# Patient Record
Sex: Female | Born: 1935 | Race: White | Hispanic: No | Marital: Married | Smoking: Former smoker
Health system: Southern US, Community
[De-identification: ages and names within clinical notes are randomized; demographics above are authoritative.]

## PROBLEM LIST (undated history)

## (undated) DIAGNOSIS — R42 Dizziness and giddiness: Secondary | ICD-10-CM

## (undated) DIAGNOSIS — M069 Rheumatoid arthritis, unspecified: Secondary | ICD-10-CM

## (undated) DIAGNOSIS — W19XXXA Unspecified fall, initial encounter: Secondary | ICD-10-CM

## (undated) DIAGNOSIS — D320 Benign neoplasm of cerebral meninges: Secondary | ICD-10-CM

## (undated) DIAGNOSIS — M810 Age-related osteoporosis without current pathological fracture: Secondary | ICD-10-CM

## (undated) DIAGNOSIS — F419 Anxiety disorder, unspecified: Secondary | ICD-10-CM

## (undated) DIAGNOSIS — E785 Hyperlipidemia, unspecified: Secondary | ICD-10-CM

## (undated) DIAGNOSIS — T4145XA Adverse effect of unspecified anesthetic, initial encounter: Secondary | ICD-10-CM

## (undated) DIAGNOSIS — M1990 Unspecified osteoarthritis, unspecified site: Secondary | ICD-10-CM

## (undated) DIAGNOSIS — Z9289 Personal history of other medical treatment: Secondary | ICD-10-CM

## (undated) DIAGNOSIS — Z79899 Other long term (current) drug therapy: Secondary | ICD-10-CM

## (undated) DIAGNOSIS — R197 Diarrhea, unspecified: Secondary | ICD-10-CM

## (undated) DIAGNOSIS — R296 Repeated falls: Secondary | ICD-10-CM

## (undated) DIAGNOSIS — F32A Depression, unspecified: Secondary | ICD-10-CM

## (undated) DIAGNOSIS — I739 Peripheral vascular disease, unspecified: Secondary | ICD-10-CM

## (undated) DIAGNOSIS — K5190 Ulcerative colitis, unspecified, without complications: Secondary | ICD-10-CM

## (undated) DIAGNOSIS — K219 Gastro-esophageal reflux disease without esophagitis: Secondary | ICD-10-CM

## (undated) DIAGNOSIS — J189 Pneumonia, unspecified organism: Secondary | ICD-10-CM

## (undated) DIAGNOSIS — F329 Major depressive disorder, single episode, unspecified: Secondary | ICD-10-CM

## (undated) DIAGNOSIS — E039 Hypothyroidism, unspecified: Secondary | ICD-10-CM

## (undated) DIAGNOSIS — R0600 Dyspnea, unspecified: Secondary | ICD-10-CM

## (undated) DIAGNOSIS — Z8781 Personal history of (healed) traumatic fracture: Secondary | ICD-10-CM

## (undated) DIAGNOSIS — T148XXA Other injury of unspecified body region, initial encounter: Secondary | ICD-10-CM

## (undated) DIAGNOSIS — G8929 Other chronic pain: Secondary | ICD-10-CM

## (undated) DIAGNOSIS — R609 Edema, unspecified: Secondary | ICD-10-CM

## (undated) DIAGNOSIS — I499 Cardiac arrhythmia, unspecified: Secondary | ICD-10-CM

## (undated) DIAGNOSIS — I10 Essential (primary) hypertension: Secondary | ICD-10-CM

## (undated) DIAGNOSIS — H9190 Unspecified hearing loss, unspecified ear: Secondary | ICD-10-CM

## (undated) DIAGNOSIS — M4800 Spinal stenosis, site unspecified: Secondary | ICD-10-CM

## (undated) DIAGNOSIS — M7989 Other specified soft tissue disorders: Secondary | ICD-10-CM

## (undated) DIAGNOSIS — H409 Unspecified glaucoma: Secondary | ICD-10-CM

## (undated) DIAGNOSIS — I809 Phlebitis and thrombophlebitis of unspecified site: Secondary | ICD-10-CM

## (undated) DIAGNOSIS — J42 Unspecified chronic bronchitis: Secondary | ICD-10-CM

## (undated) DIAGNOSIS — D649 Anemia, unspecified: Secondary | ICD-10-CM

## (undated) DIAGNOSIS — M549 Dorsalgia, unspecified: Secondary | ICD-10-CM

## (undated) HISTORY — DX: Other specified soft tissue disorders: M79.89

## (undated) HISTORY — PX: EYE SURGERY: SHX253

## (undated) HISTORY — PX: SPINE SURGERY: SHX786

## (undated) HISTORY — DX: Diarrhea, unspecified: R19.7

## (undated) HISTORY — DX: Other long term (current) drug therapy: Z79.899

## (undated) HISTORY — PX: SINUS SURGERY WITH INSTATRAK: SHX5215

## (undated) HISTORY — DX: Hyperlipidemia, unspecified: E78.5

## (undated) HISTORY — DX: Phlebitis and thrombophlebitis of unspecified site: I80.9

## (undated) HISTORY — DX: Edema, unspecified: R60.9

## (undated) HISTORY — DX: Unspecified glaucoma: H40.9

## (undated) HISTORY — PX: JOINT REPLACEMENT: SHX530

## (undated) HISTORY — PX: TONSILLECTOMY: SUR1361

## (undated) HISTORY — DX: Spinal stenosis, site unspecified: M48.00

## (undated) HISTORY — PX: BACK SURGERY: SHX140

## (undated) HISTORY — DX: Age-related osteoporosis without current pathological fracture: M81.0

---

## 1986-03-13 HISTORY — PX: FUNCTIONAL ENDOSCOPIC SINUS SURGERY: SUR616

## 1995-03-14 HISTORY — PX: THYROIDECTOMY, PARTIAL: SHX18

## 1998-01-26 ENCOUNTER — Other Ambulatory Visit: Admission: RE | Admit: 1998-01-26 | Discharge: 1998-01-26 | Payer: Self-pay | Admitting: Obstetrics & Gynecology

## 1999-02-17 ENCOUNTER — Other Ambulatory Visit: Admission: RE | Admit: 1999-02-17 | Discharge: 1999-02-17 | Payer: Self-pay | Admitting: Obstetrics & Gynecology

## 2000-04-16 ENCOUNTER — Other Ambulatory Visit: Admission: RE | Admit: 2000-04-16 | Discharge: 2000-04-16 | Payer: Self-pay | Admitting: Obstetrics & Gynecology

## 2000-04-30 ENCOUNTER — Encounter: Payer: Self-pay | Admitting: Family Medicine

## 2000-04-30 ENCOUNTER — Encounter: Admission: RE | Admit: 2000-04-30 | Discharge: 2000-04-30 | Payer: Self-pay | Admitting: Family Medicine

## 2001-05-09 ENCOUNTER — Other Ambulatory Visit: Admission: RE | Admit: 2001-05-09 | Discharge: 2001-05-09 | Payer: Self-pay | Admitting: Obstetrics & Gynecology

## 2002-03-13 LAB — HM COLONOSCOPY

## 2002-06-10 ENCOUNTER — Other Ambulatory Visit: Admission: RE | Admit: 2002-06-10 | Discharge: 2002-06-10 | Payer: Self-pay | Admitting: Obstetrics & Gynecology

## 2004-06-23 ENCOUNTER — Other Ambulatory Visit: Admission: RE | Admit: 2004-06-23 | Discharge: 2004-06-23 | Payer: Self-pay | Admitting: Obstetrics & Gynecology

## 2004-08-18 ENCOUNTER — Encounter (INDEPENDENT_AMBULATORY_CARE_PROVIDER_SITE_OTHER): Payer: Self-pay | Admitting: Specialist

## 2004-08-18 ENCOUNTER — Ambulatory Visit (HOSPITAL_BASED_OUTPATIENT_CLINIC_OR_DEPARTMENT_OTHER): Admission: RE | Admit: 2004-08-18 | Discharge: 2004-08-18 | Payer: Self-pay | Admitting: Orthopedic Surgery

## 2004-08-18 ENCOUNTER — Ambulatory Visit (HOSPITAL_COMMUNITY): Admission: RE | Admit: 2004-08-18 | Discharge: 2004-08-18 | Payer: Self-pay | Admitting: Orthopedic Surgery

## 2006-03-13 DIAGNOSIS — J189 Pneumonia, unspecified organism: Secondary | ICD-10-CM

## 2006-03-13 HISTORY — DX: Pneumonia, unspecified organism: J18.9

## 2006-10-18 ENCOUNTER — Ambulatory Visit: Payer: Self-pay | Admitting: Internal Medicine

## 2006-11-29 ENCOUNTER — Ambulatory Visit: Payer: Self-pay | Admitting: Internal Medicine

## 2007-03-14 DIAGNOSIS — T8859XA Other complications of anesthesia, initial encounter: Secondary | ICD-10-CM

## 2007-03-14 HISTORY — DX: Other complications of anesthesia, initial encounter: T88.59XA

## 2007-04-19 ENCOUNTER — Encounter: Admission: RE | Admit: 2007-04-19 | Discharge: 2007-04-19 | Payer: Self-pay | Admitting: Internal Medicine

## 2007-07-02 ENCOUNTER — Encounter (INDEPENDENT_AMBULATORY_CARE_PROVIDER_SITE_OTHER): Payer: Self-pay | Admitting: Orthopedic Surgery

## 2007-07-02 ENCOUNTER — Ambulatory Visit (HOSPITAL_BASED_OUTPATIENT_CLINIC_OR_DEPARTMENT_OTHER): Admission: RE | Admit: 2007-07-02 | Discharge: 2007-07-02 | Payer: Self-pay | Admitting: Orthopedic Surgery

## 2008-03-02 ENCOUNTER — Encounter: Admission: RE | Admit: 2008-03-02 | Discharge: 2008-03-02 | Payer: Self-pay | Admitting: Internal Medicine

## 2009-09-10 ENCOUNTER — Encounter: Admission: RE | Admit: 2009-09-10 | Discharge: 2009-09-10 | Payer: Self-pay | Admitting: Neurological Surgery

## 2009-11-11 ENCOUNTER — Inpatient Hospital Stay (HOSPITAL_COMMUNITY): Admission: RE | Admit: 2009-11-11 | Discharge: 2009-11-15 | Payer: Self-pay | Admitting: Neurological Surgery

## 2009-11-11 HISTORY — PX: POSTERIOR FUSION LUMBAR SPINE: SUR632

## 2010-01-11 ENCOUNTER — Emergency Department (HOSPITAL_COMMUNITY)
Admission: EM | Admit: 2010-01-11 | Discharge: 2010-01-12 | Payer: Self-pay | Source: Home / Self Care | Admitting: Emergency Medicine

## 2010-05-27 LAB — BASIC METABOLIC PANEL
Chloride: 103 mEq/L (ref 96–112)
GFR calc Af Amer: >60 mL/min (ref >60)
Potassium: 4.1 mEq/L (ref 3.5–5.1)

## 2010-05-27 LAB — CBC
HCT: 43.2 % (ref 36.0–46.0)
MCV: 99.5 fL (ref 78.0–100.0)
Platelets: 295 10*3/uL (ref 150–400)
RBC: 4.34 MIL/uL (ref 3.87–5.11)
WBC: 7.9 10*3/uL (ref 4.0–10.5)

## 2010-05-27 LAB — TYPE AND SCREEN

## 2010-05-27 LAB — SURGICAL PCR SCREEN: Staphylococcus aureus: NEGATIVE

## 2010-06-07 ENCOUNTER — Other Ambulatory Visit: Payer: Self-pay | Admitting: Neurological Surgery

## 2010-06-07 DIAGNOSIS — M47812 Spondylosis without myelopathy or radiculopathy, cervical region: Secondary | ICD-10-CM

## 2010-06-17 ENCOUNTER — Ambulatory Visit
Admission: RE | Admit: 2010-06-17 | Discharge: 2010-06-17 | Disposition: A | Discharge status: Home / Self Care | Payer: Medicare Other | Source: Ambulatory Visit | Attending: Neurological Surgery | Admitting: Neurological Surgery

## 2010-06-17 DIAGNOSIS — M47812 Spondylosis without myelopathy or radiculopathy, cervical region: Secondary | ICD-10-CM

## 2010-07-26 NOTE — Assessment & Plan Note (Signed)
[facility]                             PULMONARY OFFICE NOTE   NAME:[name] [name] PRASHAD                      MRN:          [id number]  DATE:10/18/2006                            DOB:          1934/12/04    HISTORY:  This is a 76 year old female with long standing rheumatoid  arthritis, and also a long standing smoking history still smoking 5  cigarettes a day and concerned because she has an abnormal chest x-ray.  The patient denies ever having had a chest x-ray before returning from  [place], where she experienced a dry cough that was associated with  dyspnea on exertion, but became worse after travel back to [place], and  within two days she was having increasing dyspnea at rest associated  with purulent sputum production for which she received a course of  Levaquin, and initially, she felt 90% better which gradually has  improved now to 100% baseline.   It turns out that her baseline is an almost daily sensation of too  much throat mucus with intermittent production of thick white mucus  that is worse several times a year and requires antibiotic therapy. She  says that she has noticed it is worse whenever she flies, and also in  the springtime. She denies ever needing chronic inhalers or significant  itching, sneezing, wheezing, obvious sinus, or reflux symptoms.   PAST MEDICAL HISTORY:  Significant for rheumatoid arthritis for which  she is on Methotrexate and prednisone, but she has never had pulmonary  manifestations to her knowledge and feels that her disease is well  controlled under Dr. Jeanine Luz care. Her past medical history is also  significant for ulcerative colitis and thyroid disease.   ALLERGIES:  None known.   MEDICATIONS:  Taken in detail on the worksheet, do not include any  pulmonary medicines. Please see the face sheet column dated 10/18/2006 for  details.   SOCIAL HISTORY:  She continues to smoke 5 cigarettes per day and she  is  a retired Comptroller.   FAMILY HISTORY:  Significant for the absence of rheumatologic disease or  respiratory disease.   REVIEW OF SYSTEMS:  Taken in detail on the worksheet and negative except  as outlined above.   PHYSICAL EXAMINATION:  GENERAL:  This is a pleasant, ambulatory white  female in no acute distress.  VITAL SIGNS:  Stable.  HEENT:  Unremarkable. Oropharynx is clear. Dentition is intact. Nasal  turbinates are normal. Ear canals clear bilaterally.  NECK:  Supple without cervical adenopathy, tenderness, or thyromegaly.  Trachea is midline.  LUNGS:  Fields reveal a few pops and squeaks bilaterally on inspiration  with minimal rhonchi on expiration.  CARDIAC:  Regular rate and rhythm with no murmurs, rubs, or gallops.  ABDOMEN:  Soft and benign.  EXTREMITIES:  Warm without calf tenderness, cyanosis, clubbing, or  edema.   No old x-rays are available. The new x-rays that have been obtained  since she returned from [place] indicate an infiltrate in the right middle  lobe that is not completely resolved with most recent x-ray dating  09/19/2006.   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 It is not clear to me whether the frequent  bronchitis that she has had represents immunocompromise from stimulus  suppression or perhaps the effect of rheumatoid arthritis on her airways  or parenchyma. 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   I would recommend therefore that she return in six weeks for a follow up  chest x-ray, PFTs, and that she make every effort to stop smoking  between now and then. If she would like referral to our [facility]  program or prescription for Chantix, I would be happy to supply one to  her,  but the decision to quit smoking needs to come from her first.   I spent extra time discussing the concept of a mucociliary escalator  in terms that I thought that she could understand, and I pleaded with  her to make every effort stop smoking and to take Mucinex 1 to 2 b.i.d.  p.r.n. for cough and congestion (we might consider adding Advair on her  next visit depending on the level of airflow obstruction that is present  to control the airways inflammation and promote better mucociliary  function).     [name] [name]. Sherene Sires, MD, [facility]  Electronically Signed    MBW/MedQ  DD: 10/18/2006  DT: 10/19/2006  Job #: [id number]   cc:   [name] Millard. Hyacinth [name], M.D.  [name] [name], M.D.

## 2010-07-26 NOTE — Assessment & Plan Note (Signed)
[facility]                             PULMONARY OFFICE NOTE   NAME:Kerekes, KARN DERK                      MRN:          [id number]  DATE:11/29/2006                            DOB:          09-12-1934    This is a pulmonary/followup office visit.   HISTORY:  This is a 76 year old white female with rheumatoid arthritis  returning for concern regarding right middle lobe syndrome based on her  previous evaluation done on October 18, 2006.  She is all smiles today  despite the fact that she is still smoking 2 cigarettes a day, has no  significant respiratory complaints.  Specifically, she denies any  significant dyspnea, cough, fevers, chills, sweats, pleuritic or  exertional chest pain, orthopnea, PND, leg swelling or unintended weight  loss.   She is a pleasant, ambulatory female.  For a full inventory of all of  her medications, please see Face Sheet dated November 29, 2006, correct  as listed.   PHYSICAL EXAMINATION:  She is a somber, but not overtly depressed,  ambulatory white female in no acute distress.  She is afebrile with  normal vital signs.  HEENT:  Unremarkable.  OROPHARYNX:  Clear.  LUNG FIELDS:  A few pops and squeaks but much improved from previous  evaluation.  Pops and squeaks on inspiration.  Much improved from  previous evaluation.  HEART:  Regular rate and rhythm without murmur, gallop or rub.  ABDOMEN:  Soft, benign.  EXTREMITIES:  Normal without any calf tenderness, cyanosis, clubbing,  edema.   PFTs were performed today and reveal that she does not have significant  COPD.  She has normal airflow, diffusion capacity corrects to 100%.  A  chest x-ray shows minimal streaky changes in the right middle lobe which  compared to previous studies from 2003 are identical.   IMPRESSION:  1. 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  Therefore, the main      aspect of her care is to maintain her off cigarettes, use as needed      Mucinex but no more aggressive bronchodilators are needed.  2. Right middle lobe syndrome in the setting of acquired mucociliary      dysfunction is secondary to poor collateral ventilation and      tendency to atelectasis.  It is rarely, however, a complication of      lung cancer but would not have improved with conservative      management.   I believe her baseline chest x-ray also shows, in retrospect, from 2003  an area of streaky atelectasis in the right middle lobe, but other than  maintaining off cigarettes, and p.r.n. Mucinex, I have nothing else to  recommend other than a chest x-ray at 6 months and then yearly  thereafter, especially if she continues to smoke, which I have strongly  advised her to commit to quit.   PULMONARY FOLLOWUP:  Is going to be p.r.n.     Charlaine Dalton. Sherene Sires, MD,  [facility]  Electronically Signed    MBW/MedQ  DD: 11/29/2006  DT: 11/29/2006  Job #: [id number]   cc:   Bertram Millard. Hyacinth Meeker, M.D.  Lemmie Evens, M.D.

## 2010-07-26 NOTE — Op Note (Signed)
[name], [name]               ACCOUNT NO.:  [id number]   MEDICAL RECORD NO.:  [id number]          PATIENT TYPE:  AMB   LOCATION:  DSC                          FACILITY:  [facility]   PHYSICIAN:  Katy Fitch. Sypher, M.D. DATE OF BIRTH:  Jul 03, 1934   DATE OF PROCEDURE:  07/02/2007  DATE OF DISCHARGE:                               OPERATIVE REPORT   REFERRING PHYSICIAN:  Dr. Jimmye Norman.   PREOPERATIVE DIAGNOSES:  Chronic rheumatoid arthritis with progressive  deformity of right index finger metacarpal phalangeal joint and severe  ulnar deviation deformity and remodeling of right long finger proximal  interphalangeal joints.   POSTOPERATIVE DIAGNOSES:  Chronic rheumatoid arthritis with progressive  deformity of right index finger metacarpal phalangeal joint and severe  ulnar deviation deformity and remodeling of right long finger proximal  interphalangeal joints.   OPERATIONS:  1. Implant arthroplasty and reconstruction of right index finger      metacarpal phalangeal joint with formal radial collateral ligament      reconstruction and extensor realignment.  2. Arthrodesis of right long finger PIP joint with correction of ulnar      deviation deformity utilizing 0.035-inch Kirschner wires x2, a      tension band wire, and a box wire to stabilize the PIP joint.   OPERATING SURGEON:  Katy Fitch. Sypher, M.D.   ASSISTANT:  Marveen Reeks. Dasnoit, PA-C.   ANESTHESIA:  General by LMA supplemented by a right infraclavicular  block, supervising anesthesiologist Dr. Sampson Goon.   INDICATIONS:  [name] is a 76 year old woman referred through the  courtesy of Dr. Syliva Overman for evaluation and management of  bilateral hand deformities due to chronic rheumatoid arthritis.  She is  status post successful left hand surgery and now returns for surgery to  reconstruct the right index finger metacarpophalangeal joint and the  right long finger PIP joint.  Preoperatively, she was advised  the  potential risks, and benefits of surgery.  She was referred by Dr.  Jimmye Norman her internal medicine physician as well as Dr. Jimmy Footman.  She has been off of her Arava and methotrexate for 1 week in the  perioperative period.   After informed consent, she is brought to operating room at this time.   PROCEDURE:  [name] is brought to the operating room and placed in  supine position on the operating table.   Following an anesthesia consult with Dr. Sampson Goon, a infraclavicular  block was placed on the right with excellent anesthesia of the right  forequarter and arm.   She was brought to room 6, placed in supine position on the table and  under Dr. Sampson Goon direct supervision general anesthesia by LMA  technique induced.   The entire upper extremity was prepped with Betadine soap solution,  sterilely draped.  A 1 gram of Ancef was administered as IV prophylactic  antibiotic.  The right arm was exsanguinated with Esmarch bandage and  the arterial tourniquet on the proximal brachium inflated to 250 mmHg.  The procedure commenced with a curvilinear incision exposing extensor  mechanism of the long finger PIP joint.  Subcutaneous tissues were  carefully divided taking care to identify and electrocauterized the  dorsal veins.  The sensory nerves were preserved.  The extensor tendon  was split the midline followed by exposure of the capsule of the PIP  joint.  The collateral ligaments were released in a joint open shotgun  style.  After complete synovectomy a cup and cone type arthrodesis was  fashioned.   There is marked deformity at the base of the middle phalanx.  Ultimately, we performed a cup and cone arthrodesis that would maximize  the surface contact between the proximal phalanx and middle phalanx.  A  figure-of-eight tension band wire and a box wire were both placed  through drill holes through the proximal phalangeal neck and the base of  the middle phalanx.   After these were tensioned, adjusting the position  of the finger to 30 degrees of flexion at the PIP joint two 0.035-inch  Kirschner wires were drilled across the joint, securing the arthrodesis.   Excellent bone-on-bone apposition was achieved over the dorsal and  central halves of the joint.   Due to the incongruity between the deformed middle phalanx and proximal  phalanx compromises were accepted to maximize bone-on-bone contact.  The  capsule was then repaired with interrupted suture of 4-0 Mersilene  followed by repair the extensor mechanism with the multiple interrupted  sutures figure-of-eight style of 4-0 Mersilene knots buried.  The skin  is repaired with intradermal 4-0 Prolene.   Attention directed to the right index finger, a curvilinear incision was  used to expose the extensor mechanism overlying the MP joint.  The  extensor was split between the extensor indicis proprius and the  extensor digitorum longus.  The capsule of the MP joint was incised  longitudinally and followed by complete synovectomy.  The ulnar  collateral ligament was released and the radial collateral ligament  partially released.  After careful isolation of the entire metacarpal  head a dramatic deformity was noted.  All of the hyaline articular  cartilage was lost and the contours of the metacarpal head were  dramatically altered.  The base of the proximal phalanx was resected  with a rongeur.  An oscillating saw to provide a perpendicular surface  to the shaft of the proximal phalanx followed by preparation of the  metacarpal and proximal phalanx in the usual manner for accepting of a  silicone implant arthroplasty.  The head was resected off of the  metacarpal through the neck followed by tailoring of the intramedullary  canal with a bone awl, hand rasps, and at the base of the proximal  phalanx use of a Swanson reamer with the [employer].  A size 20 DePuy  trial was deemed acceptable followed by  formal reconstruction of radial  collateral ligament with 3-0 Ethibond with through bone suture anchoring  to the neck of the metacarpal.   The subluxation of the MP joint was corrected followed by correction of  the ulnar deviation.  The radial collateral ligaments tensioned followed  by placement of the size 20 implant with no-touch technique.  The  capsule was repaired anatomically with mattress sutures of 3-0 Ethilon  followed by repair of the extensor mechanism with mattress sutures of 3-  0 Ethibond knots buried.   A very satisfactory reconstruction of the index MP joint was  accomplished.  The skin was closed with intradermal 4-0 Prolene and  Steri-Strips.   The wounds were dressed with Xeroflo sterile gauze and a voluminous  Webril dressing with a volar plaster splint maintaining the index and  long fingers in the safe position with the MP joints flexed  approximately 30 degrees.  The dressing was finished with an Ace wrap  for compression.  There were no apparent complications.      Katy Fitch Sypher, M.D.  Electronically Signed     RVS/MEDQ  D:  07/02/2007  T:  07/03/2007  Job:  [id number]   cc:   Viviann Spare Dr. Hyacinth Meeker

## 2010-07-29 NOTE — Op Note (Signed)
[name], [name]               ACCOUNT NO.:  [id number]   MEDICAL RECORD NO.:  [id number]          PATIENT TYPE:  AMB   LOCATION:  DSC                          FACILITY:  [facility]   PHYSICIAN:  Katy Fitch. Sypher, M.D. DATE OF BIRTH:  [birth year]   DATE OF PROCEDURE:  08/18/2004  DATE OF DISCHARGE:                                 OPERATIVE REPORT   PREOPERATIVE DIAGNOSES:  A 90-degree radial dislocation of left thumb  interphalangeal joint due to destructive rheumatoid arthritis with large  rheumatoid nodule/bursal pad formation on ulnar aspect of proximal  phalangeal head.   POSTOPERATIVE DIAGNOSES:  A 90-degree radial dislocation of left thumb  interphalangeal joint due to destructive rheumatoid arthritis with large  rheumatoid nodule/bursal pad formation on ulnar aspect of proximal  phalangeal head.   OPERATIONS:  1.  Arthrodesis of left thumb IP joint with autogenous graft and internal      fixation utilizing a 26 mm Mini Acutrak II screw.  2.  Resection of ulnar-sided bursa and extensive skin reduction to      reapportion the skin on the ulnar aspect of the left thumb.   OPERATING SURGEON:  Katy Fitch. Sypher, M.D.   ASSISTANT:  Jonni Sanger, P.A.   ANESTHESIA:  General by LMA.   SUPERVISING ANESTHESIOLOGIST:  Sheldon Silvan, M.D.   INDICATIONS:  [name] is a 76 year old woman who has had chronic  rheumatoid arthritis for more than 10 years. She has been on long-term  prednisone and methotrexate therapy. Despite efforts at controlling her  disease, she has developed destructive arthropathy affecting both thumbs and  most of her fingers.   She had developed a very impaired pinch on the left due to a 90-degree  radial dislocation of her thumb IP joint with extensive destructive changes  in the proximal phalangeal head and the base of the distal phalanx.   After a lengthy informed consent x 2, she requested that we proceed with  stabilization of her thumb with  arthrodesis of the IP joint. We recommended  autogenous graft and internal fixation with an intermedullary screw in an  effort to maximize her construct strength and to increase the chance of [place]  achieving union of her attempted arthrodesis.   She was noted have a large rheumatoid nodule and bursa formation over the  ulnar aspect of the proximal phalangeal head where she had been using her  proximal phalanx for prehension for many years.   We recommended that this be resected and the skin reapportioned with flap  technique.   After informed consent, she is brought to the operating room at this time.   PROCEDURE:  [name] is brought to the operating room and placed in the  supine position on the operating table. Following anesthesia consultation  with Dr. Ivin Booty, general anesthesia by LMA technique was induced.   The left arm was prepped with Betadine soap and solution and sterilely  draped.   Following exsanguination of the limb with an Esmarch bandage, an arterial  tourniquet on the proximal brachium was inflated to 250 mmHg. The procedure  commenced with  a dorsal curvilinear incision exposing the IP joint and  extensor mechanism. The extensor was split longitudinally and released along  its radial border to facilitate exposure of the interphalangeal joint. The  joint had extreme anatomic changes due to the chronic rheumatoid arthritis.  A gelatinous material was recovered from the joint that had calcium crystals  within the joint.   The proximal phalangeal head had extensive destructive changes with loss of  most of the radial and ulnar palmar aspect of the condyles. The head of the  proximal phalanx was resected, exposing the intermedullary canal. The base  of the distal phalanx was thoroughly irrigated and a power bur was used to  create a cup and cone type arthrodesis.   A 0.045-inch Kirschner wire was placed with a retrograde technique out the  base of the distal  phalanx, exiting just below the nail. The IP joint was  placed in neutral position and the Kirschner wire driven proximally across  the MP joint.   Local bone graft was placed that had been harvested from the proximal  phalangeal head, as well as from fracture of the adjacent osteophytes at the  base of the distal phalanx. The osteophytes were left attached to the  capsular soft tissues to facilitate recovery of blood flow and rapid  healing.   A 26 mm Acutrak II screw was placed securing the joint in a satisfactory  position of 0 degrees flexion. This was placed in slight pronation to  facilitate pulp-to-pulp pinch with the index finger and long finger. Bone  fragments were packed along the arthrodesis site to facilitate healing.   The extensor mechanism was then repaired with a mattress suture of 4-0  Mersilene followed by repair of the skin dorsally with interrupted sutures  of 5-0 nylon.   A large fragment of skin was resected in a V-shaped flap and reapportioning  of the ulnar skin coupled with an extensive subcutaneous dissection removed  a large bursa and rheumatoid nodule that had formed along the ulnar aspect  of the proximal phalanx. Care was taken to protect the ulnar proper digital  nerve and its terminal branches throughout dissection.   The wound was then repaired with multiple interrupted sutures of 5-0 nylon.  A very cosmetic-appearing thumb was achieved with stable pinch posture.   [name] tolerated surgery and anesthesia well. The tourniquet was  released with a total tourniquet time of 59 minutes followed by placement of  a voluminous gauze dressing with Xeroflo, sterile gauze and a thumb spica  splint.   For aftercare, she will be discharged with prescriptions for Percocet 5 mg  one to two tablets p.o. q.4-6h. p.r.n. pain, 30 tablets without refill, and  al Keflex 500 mg one p.o. q.8h. x 4 days as a prophylactic antibiotic.      RVS/MEDQ  D:   08/18/2004  T:  08/18/2004  Job:  [id number]

## 2010-12-06 LAB — BASIC METABOLIC PANEL
BUN: 16
CO2: 28
Calcium: 8.9
Creatinine, Ser: 0.76
GFR calc Af Amer: >60

## 2011-03-27 ENCOUNTER — Other Ambulatory Visit: Payer: Self-pay | Admitting: Dermatology

## 2011-09-06 ENCOUNTER — Other Ambulatory Visit: Payer: Self-pay | Admitting: Obstetrics & Gynecology

## 2011-09-22 ENCOUNTER — Other Ambulatory Visit: Payer: Self-pay | Admitting: Neurological Surgery

## 2011-09-22 DIAGNOSIS — M5416 Radiculopathy, lumbar region: Secondary | ICD-10-CM

## 2011-09-22 DIAGNOSIS — M47816 Spondylosis without myelopathy or radiculopathy, lumbar region: Secondary | ICD-10-CM

## 2011-10-02 ENCOUNTER — Ambulatory Visit
Admission: RE | Admit: 2011-10-02 | Discharge: 2011-10-02 | Disposition: A | Discharge status: Home / Self Care | Payer: Medicare Other | Source: Ambulatory Visit | Attending: Neurological Surgery | Admitting: Neurological Surgery

## 2011-10-02 DIAGNOSIS — M5416 Radiculopathy, lumbar region: Secondary | ICD-10-CM

## 2011-10-02 DIAGNOSIS — M47816 Spondylosis without myelopathy or radiculopathy, lumbar region: Secondary | ICD-10-CM

## 2011-10-02 MED ORDER — GADOBENATE DIMEGLUMINE 529 MG/ML IV SOLN
12.0000 mL | Freq: Once | INTRAVENOUS | Status: AC | PRN
  Administered 2011-10-02: 12 mL via INTRAVENOUS

## 2011-10-06 ENCOUNTER — Other Ambulatory Visit: Payer: Self-pay | Admitting: Neurological Surgery

## 2011-10-17 ENCOUNTER — Encounter (HOSPITAL_COMMUNITY): Payer: Self-pay | Admitting: Pharmacy Technician

## 2011-10-24 ENCOUNTER — Encounter (HOSPITAL_COMMUNITY): Payer: Self-pay

## 2011-10-24 ENCOUNTER — Encounter (HOSPITAL_COMMUNITY)
Admission: RE | Admit: 2011-10-24 | Discharge: 2011-10-24 | Disposition: A | Discharge status: Home / Self Care | Payer: Medicare Other | Source: Ambulatory Visit | Attending: Neurological Surgery | Admitting: Neurological Surgery

## 2011-10-24 ENCOUNTER — Ambulatory Visit (HOSPITAL_COMMUNITY)
Admission: RE | Admit: 2011-10-24 | Discharge: 2011-10-24 | Disposition: A | Discharge status: Home / Self Care | Payer: Medicare Other | Source: Ambulatory Visit | Attending: Neurological Surgery | Admitting: Neurological Surgery

## 2011-10-24 DIAGNOSIS — IMO0002 Reserved for concepts with insufficient information to code with codable children: Secondary | ICD-10-CM | POA: Insufficient documentation

## 2011-10-24 DIAGNOSIS — Z01812 Encounter for preprocedural laboratory examination: Secondary | ICD-10-CM | POA: Insufficient documentation

## 2011-10-24 DIAGNOSIS — Z01810 Encounter for preprocedural cardiovascular examination: Secondary | ICD-10-CM | POA: Insufficient documentation

## 2011-10-24 DIAGNOSIS — Z01818 Encounter for other preprocedural examination: Secondary | ICD-10-CM | POA: Insufficient documentation

## 2011-10-24 HISTORY — DX: Essential (primary) hypertension: I10

## 2011-10-24 HISTORY — DX: Ulcerative colitis, unspecified, without complications: K51.90

## 2011-10-24 HISTORY — DX: Adverse effect of unspecified anesthetic, initial encounter: T41.45XA

## 2011-10-24 HISTORY — DX: Unspecified osteoarthritis, unspecified site: M19.90

## 2011-10-24 HISTORY — DX: Hypothyroidism, unspecified: E03.9

## 2011-10-24 HISTORY — DX: Pneumonia, unspecified organism: J18.9

## 2011-10-24 LAB — CBC
HCT: 42.1 % (ref 36.0–46.0)
Hemoglobin: 14 g/dL (ref 12.0–15.0)
MCH: 31 pg (ref 26.0–34.0)
MCHC: 33.3 g/dL (ref 30.0–36.0)
MCV: 93.3 fL (ref 78.0–100.0)

## 2011-10-24 LAB — BASIC METABOLIC PANEL
BUN: 29 mg/dL — ABNORMAL HIGH (ref 6–23)
Calcium: 9.5 mg/dL (ref 8.4–10.5)
Creatinine, Ser: 0.83 mg/dL (ref 0.50–1.10)
GFR calc non Af Amer: 68 mL/min — ABNORMAL LOW (ref >90)
Glucose, Bld: 102 mg/dL — ABNORMAL HIGH (ref 70–99)
Potassium: 3.9 mEq/L (ref 3.5–5.1)

## 2011-10-24 LAB — TYPE AND SCREEN

## 2011-10-24 NOTE — Pre-Procedure Instructions (Signed)
20 [name]  10/24/2011   Your procedure is scheduled on:  10/31/11  Report to [facility] at 530 AM.  Call this number if you have problems the morning of surgery: [phone]   Remember:   Do not eat food:After Midnight.  Take these medicines the morning of surgery with A SIP OF WATER: prempro,vicodan,synthroid,predisone   Do not wear jewelry, make-up or nail polish.  Do not wear lotions, powders, or perfumes. You may wear deodorant.  Do not shave 48 hours prior to surgery. Men may shave face and neck.  Do not bring valuables to the hospital.  Contacts, dentures or bridgework may not be worn into surgery.  Leave suitcase in the car. After surgery it may be brought to your room.  For patients admitted to the hospital, checkout time is 11:00 AM the day of discharge.   Patients discharged the day of surgery will not be allowed to drive home.  Name and phone number of your driver: family  Special Instructions: CHG Shower Use Special Wash: 1/2 bottle night before surgery and 1/2 bottle morning of surgery.   Please read over the following fact sheets that you were given: Pain Booklet, Coughing and Deep Breathing, Blood Transfusion Information, MRSA Information and Surgical Site Infection Prevention

## 2011-10-25 NOTE — Consult Note (Signed)
Anesthesia chart review: Patient is a 77 year old female scheduled for L2-3 decompression/fusion/PLIF on 10/31/2011 by Dr. Danielle Dess.  History includes pneumonia, ulcerative colitis, hypertension, RA, thyroidectomy with secondary hypothyroidism, former smoker and now passive smoker, prior sinus and back surgeries.  She became hypertension with an anesthesia block in the past.  Dr. Renato Gails is her PCP.  EKG on 10/24/2011. Showed sinus bradycardia at 58 beats per minute, anterior infarct, age undetermined. It was not felt significantly changed from her preoperative EKG on 11/05/2009.  No CV symptoms were documented at her PAT visit.  Chest x-ray on 10/24/2011 showed no active cardiopulmonary disease.  Labs noted.  Anticipate she can proceed as planned.  Shonna Chock, PA-C

## 2011-10-30 MED ORDER — CEFAZOLIN SODIUM-DEXTROSE 2-3 GM-% IV SOLR
2.0000 g | INTRAVENOUS | Status: AC
  Administered 2011-10-31: 2 g via INTRAVENOUS
  Filled 2011-10-30 (×2): qty 50

## 2011-10-31 ENCOUNTER — Encounter (HOSPITAL_COMMUNITY): Payer: Self-pay | Admitting: Vascular Surgery

## 2011-10-31 ENCOUNTER — Inpatient Hospital Stay (HOSPITAL_COMMUNITY)
Admission: RE | Admit: 2011-10-31 | Discharge: 2011-11-02 | DRG: 460 | Disposition: A | Discharge status: Home / Self Care | Payer: Medicare Other | Source: Ambulatory Visit | Attending: Neurological Surgery | Admitting: Neurological Surgery

## 2011-10-31 ENCOUNTER — Encounter (HOSPITAL_COMMUNITY)
Admission: RE | Disposition: A | Discharge status: Home / Self Care | Payer: Self-pay | Source: Ambulatory Visit | Attending: Neurological Surgery

## 2011-10-31 ENCOUNTER — Inpatient Hospital Stay (HOSPITAL_COMMUNITY): Payer: Medicare Other

## 2011-10-31 ENCOUNTER — Inpatient Hospital Stay (HOSPITAL_COMMUNITY): Payer: Medicare Other | Admitting: Vascular Surgery

## 2011-10-31 ENCOUNTER — Encounter (HOSPITAL_COMMUNITY): Payer: Self-pay | Admitting: Certified Registered Nurse Anesthetist

## 2011-10-31 DIAGNOSIS — M48061 Spinal stenosis, lumbar region without neurogenic claudication: Secondary | ICD-10-CM

## 2011-10-31 DIAGNOSIS — F17200 Nicotine dependence, unspecified, uncomplicated: Secondary | ICD-10-CM | POA: Diagnosis present

## 2011-10-31 DIAGNOSIS — Z7982 Long term (current) use of aspirin: Secondary | ICD-10-CM

## 2011-10-31 DIAGNOSIS — K5900 Constipation, unspecified: Secondary | ICD-10-CM | POA: Diagnosis not present

## 2011-10-31 DIAGNOSIS — Z981 Arthrodesis status: Secondary | ICD-10-CM

## 2011-10-31 DIAGNOSIS — J42 Unspecified chronic bronchitis: Secondary | ICD-10-CM

## 2011-10-31 DIAGNOSIS — M47817 Spondylosis without myelopathy or radiculopathy, lumbosacral region: Secondary | ICD-10-CM | POA: Diagnosis present

## 2011-10-31 DIAGNOSIS — E039 Hypothyroidism, unspecified: Secondary | ICD-10-CM | POA: Diagnosis present

## 2011-10-31 DIAGNOSIS — M5126 Other intervertebral disc displacement, lumbar region: Principal | ICD-10-CM | POA: Diagnosis present

## 2011-10-31 DIAGNOSIS — Z79899 Other long term (current) drug therapy: Secondary | ICD-10-CM

## 2011-10-31 DIAGNOSIS — I10 Essential (primary) hypertension: Secondary | ICD-10-CM | POA: Diagnosis present

## 2011-10-31 DIAGNOSIS — M069 Rheumatoid arthritis, unspecified: Secondary | ICD-10-CM | POA: Diagnosis present

## 2011-10-31 DIAGNOSIS — IMO0002 Reserved for concepts with insufficient information to code with codable children: Secondary | ICD-10-CM

## 2011-10-31 DIAGNOSIS — T148XXA Other injury of unspecified body region, initial encounter: Secondary | ICD-10-CM

## 2011-10-31 HISTORY — DX: Other injury of unspecified body region, initial encounter: T14.8XXA

## 2011-10-31 HISTORY — DX: Unspecified chronic bronchitis: J42

## 2011-10-31 HISTORY — PX: POSTERIOR FUSION LUMBAR SPINE: SUR632

## 2011-10-31 HISTORY — DX: Rheumatoid arthritis, unspecified: M06.9

## 2011-10-31 HISTORY — DX: Other chronic pain: G89.29

## 2011-10-31 HISTORY — DX: Dorsalgia, unspecified: M54.9

## 2011-10-31 SURGERY — POSTERIOR LUMBAR FUSION 1 LEVEL
Anesthesia: General | Site: Back | Wound class: Clean

## 2011-10-31 MED ORDER — HYDROMORPHONE 0.3 MG/ML IV SOLN
INTRAVENOUS | Status: DC
  Administered 2011-10-31: 12:00:00 via INTRAVENOUS

## 2011-10-31 MED ORDER — LACTATED RINGERS IV SOLN
INTRAVENOUS | Status: DC | PRN
  Administered 2011-10-31 (×2): via INTRAVENOUS

## 2011-10-31 MED ORDER — ONDANSETRON HCL 4 MG/2ML IJ SOLN
4.0000 mg | INTRAMUSCULAR | Status: DC | PRN
Start: 2011-10-31 — End: 2011-11-02

## 2011-10-31 MED ORDER — VECURONIUM BROMIDE 10 MG IV SOLR
INTRAVENOUS | Status: DC | PRN
  Administered 2011-10-31 (×3): 1 mg via INTRAVENOUS
  Administered 2011-10-31: 2 mg via INTRAVENOUS
  Administered 2011-10-31 (×2): 1 mg via INTRAVENOUS

## 2011-10-31 MED ORDER — DIAZEPAM 5 MG PO TABS
5.0000 mg | ORAL_TABLET | Freq: Four times a day (QID) | ORAL | Status: DC | PRN
  Administered 2011-10-31 – 2011-11-02 (×6): 5 mg via ORAL
  Filled 2011-10-31 (×6): qty 1

## 2011-10-31 MED ORDER — DIPHENHYDRAMINE HCL 12.5 MG/5ML PO ELIX: 12.5000 mg | ORAL_SOLUTION | Freq: Four times a day (QID) | ORAL | Status: DC | PRN

## 2011-10-31 MED ORDER — ACETAMINOPHEN 325 MG PO TABS: 650.0000 mg | ORAL_TABLET | ORAL | Status: DC | PRN

## 2011-10-31 MED ORDER — SODIUM CHLORIDE 0.9 % IJ SOLN: 3.0000 mL | INTRAMUSCULAR | Status: DC | PRN

## 2011-10-31 MED ORDER — ACETAMINOPHEN 10 MG/ML IV SOLN
INTRAVENOUS | Status: AC
  Filled 2011-10-31: qty 100

## 2011-10-31 MED ORDER — HYDROMORPHONE 0.3 MG/ML IV SOLN
INTRAVENOUS | Status: AC
  Filled 2011-10-31: qty 25

## 2011-10-31 MED ORDER — LIDOCAINE-EPINEPHRINE 1 %-1:100000 IJ SOLN
INTRAMUSCULAR | Status: DC | PRN
  Administered 2011-10-31: 5 mL

## 2011-10-31 MED ORDER — PHENOL 1.4 % MT LIQD: 1.0000 | OROMUCOSAL | Status: DC | PRN

## 2011-10-31 MED ORDER — HYDROCHLOROTHIAZIDE 25 MG PO TABS
25.0000 mg | ORAL_TABLET | Freq: Every day | ORAL | Status: DC
  Administered 2011-11-01 – 2011-11-02 (×2): 25 mg via ORAL
  Filled 2011-10-31 (×2): qty 1

## 2011-10-31 MED ORDER — NEOSTIGMINE METHYLSULFATE 1 MG/ML IJ SOLN
INTRAMUSCULAR | Status: DC | PRN
  Administered 2011-10-31: 3.5 mg via INTRAVENOUS

## 2011-10-31 MED ORDER — MEDROXYPROGESTERONE ACETATE 2.5 MG PO TABS
1.2500 mg | ORAL_TABLET | Freq: Every day | ORAL | Status: DC
  Administered 2011-11-01 – 2011-11-02 (×2): 1.25 mg via ORAL
  Filled 2011-10-31 (×2): qty 0.5

## 2011-10-31 MED ORDER — ESTROGENS CONJUGATED 0.3 MG PO TABS
0.3000 mg | ORAL_TABLET | Freq: Once | ORAL | Status: DC
  Filled 2011-10-31: qty 1

## 2011-10-31 MED ORDER — BUPIVACAINE HCL (PF) 0.5 % IJ SOLN
INTRAMUSCULAR | Status: DC | PRN
  Administered 2011-10-31: 5 mL

## 2011-10-31 MED ORDER — BACITRACIN 50000 UNITS IM SOLR
INTRAMUSCULAR | Status: AC
  Filled 2011-10-31: qty 1

## 2011-10-31 MED ORDER — FENTANYL CITRATE 0.05 MG/ML IJ SOLN
INTRAMUSCULAR | Status: DC | PRN
  Administered 2011-10-31 (×3): 50 ug via INTRAVENOUS
  Administered 2011-10-31: 100 ug via INTRAVENOUS

## 2011-10-31 MED ORDER — PROPOFOL 10 MG/ML IV EMUL
INTRAVENOUS | Status: DC | PRN
  Administered 2011-10-31: 120 mg via INTRAVENOUS

## 2011-10-31 MED ORDER — LATANOPROST 0.005 % OP SOLN
1.0000 [drp] | Freq: Every day | OPHTHALMIC | Status: DC
  Administered 2011-10-31 – 2011-11-01 (×2): 1 [drp] via OPHTHALMIC
  Filled 2011-10-31: qty 2.5

## 2011-10-31 MED ORDER — CONJ ESTROG-MEDROXYPROGEST ACE 0.3-1.5 MG PO TABS: 1.0000 | ORAL_TABLET | Freq: Every day | ORAL | Status: DC

## 2011-10-31 MED ORDER — KETOROLAC TROMETHAMINE 15 MG/ML IJ SOLN
15.0000 mg | Freq: Four times a day (QID) | INTRAMUSCULAR | Status: DC | PRN
  Filled 2011-10-31: qty 1

## 2011-10-31 MED ORDER — SODIUM CHLORIDE 0.9 % IR SOLN
Status: DC | PRN
  Administered 2011-10-31: 09:00:00

## 2011-10-31 MED ORDER — CEFAZOLIN SODIUM 1-5 GM-% IV SOLN
1.0000 g | Freq: Three times a day (TID) | INTRAVENOUS | Status: AC
  Administered 2011-10-31 – 2011-11-01 (×2): 1 g via INTRAVENOUS
  Filled 2011-10-31 (×2): qty 50

## 2011-10-31 MED ORDER — DIPHENHYDRAMINE HCL 50 MG/ML IJ SOLN: 12.5000 mg | Freq: Four times a day (QID) | INTRAMUSCULAR | Status: DC | PRN

## 2011-10-31 MED ORDER — ESTROGENS CONJUGATED 0.3 MG PO TABS
0.3000 mg | ORAL_TABLET | Freq: Every day | ORAL | Status: DC
  Administered 2011-11-01: 0.3 mg via ORAL
  Filled 2011-10-31 (×3): qty 1

## 2011-10-31 MED ORDER — GLYCOPYRROLATE 0.2 MG/ML IJ SOLN
INTRAMUSCULAR | Status: DC | PRN
  Administered 2011-10-31: 0.6 mg via INTRAVENOUS

## 2011-10-31 MED ORDER — MENTHOL 3 MG MT LOZG
1.0000 | LOZENGE | OROMUCOSAL | Status: DC | PRN
  Filled 2011-10-31: qty 9

## 2011-10-31 MED ORDER — SODIUM CHLORIDE 0.9 % IJ SOLN
3.0000 mL | Freq: Two times a day (BID) | INTRAMUSCULAR | Status: DC
  Administered 2011-11-01 (×2): 3 mL via INTRAVENOUS

## 2011-10-31 MED ORDER — HYDROMORPHONE HCL PF 1 MG/ML IJ SOLN
0.2500 mg | INTRAMUSCULAR | Status: DC | PRN
  Administered 2011-10-31 (×4): 0.5 mg via INTRAVENOUS

## 2011-10-31 MED ORDER — 0.9 % SODIUM CHLORIDE (POUR BTL) OPTIME
TOPICAL | Status: DC | PRN
  Administered 2011-10-31: 1000 mL

## 2011-10-31 MED ORDER — HYDROMORPHONE HCL PF 1 MG/ML IJ SOLN
INTRAMUSCULAR | Status: AC
  Administered 2011-10-31: 0.5 mg via INTRAVENOUS
  Filled 2011-10-31: qty 1

## 2011-10-31 MED ORDER — ONDANSETRON HCL 4 MG/2ML IJ SOLN: 4.0000 mg | Freq: Four times a day (QID) | INTRAMUSCULAR | Status: DC | PRN

## 2011-10-31 MED ORDER — MORPHINE SULFATE 2 MG/ML IJ SOLN: 1.0000 mg | INTRAMUSCULAR | Status: DC | PRN

## 2011-10-31 MED ORDER — HEMOSTATIC AGENTS (NO CHARGE) OPTIME
TOPICAL | Status: DC | PRN
  Administered 2011-10-31: 1 via TOPICAL

## 2011-10-31 MED ORDER — LEFLUNOMIDE 20 MG PO TABS
20.0000 mg | ORAL_TABLET | Freq: Every day | ORAL | Status: DC
  Administered 2011-11-01 – 2011-11-02 (×2): 20 mg via ORAL
  Filled 2011-10-31 (×2): qty 1

## 2011-10-31 MED ORDER — SODIUM CHLORIDE 0.9 % IV SOLN
INTRAVENOUS | Status: AC
  Filled 2011-10-31: qty 500

## 2011-10-31 MED ORDER — MIDAZOLAM HCL 5 MG/5ML IJ SOLN
INTRAMUSCULAR | Status: DC | PRN
  Administered 2011-10-31: 2 mg via INTRAVENOUS

## 2011-10-31 MED ORDER — ROCURONIUM BROMIDE 100 MG/10ML IV SOLN
INTRAVENOUS | Status: DC | PRN
  Administered 2011-10-31: 50 mg via INTRAVENOUS

## 2011-10-31 MED ORDER — OXYCODONE-ACETAMINOPHEN 5-325 MG PO TABS
1.0000 | ORAL_TABLET | ORAL | Status: DC | PRN
  Administered 2011-10-31 – 2011-11-02 (×9): 2 via ORAL
  Filled 2011-10-31 (×9): qty 2

## 2011-10-31 MED ORDER — PREDNISONE 5 MG PO TABS
15.0000 mg | ORAL_TABLET | Freq: Every day | ORAL | Status: DC
  Administered 2011-11-01 – 2011-11-02 (×2): 15 mg via ORAL
  Filled 2011-10-31 (×4): qty 1

## 2011-10-31 MED ORDER — ONDANSETRON HCL 4 MG/2ML IJ SOLN: 4.0000 mg | Freq: Once | INTRAMUSCULAR | Status: DC | PRN

## 2011-10-31 MED ORDER — LEVOTHYROXINE SODIUM 100 MCG PO TABS
100.0000 ug | ORAL_TABLET | Freq: Once | ORAL | Status: DC
  Filled 2011-10-31: qty 1

## 2011-10-31 MED ORDER — NALOXONE HCL 0.4 MG/ML IJ SOLN: 0.4000 mg | INTRAMUSCULAR | Status: DC | PRN

## 2011-10-31 MED ORDER — EPHEDRINE SULFATE 50 MG/ML IJ SOLN
INTRAMUSCULAR | Status: DC | PRN
  Administered 2011-10-31 (×2): 10 mg via INTRAVENOUS

## 2011-10-31 MED ORDER — LIDOCAINE HCL (CARDIAC) 20 MG/ML IV SOLN
INTRAVENOUS | Status: DC | PRN
  Administered 2011-10-31: 100 mg via INTRAVENOUS

## 2011-10-31 MED ORDER — ACETAMINOPHEN 10 MG/ML IV SOLN
INTRAVENOUS | Status: DC | PRN
  Administered 2011-10-31: 1000 mg via INTRAVENOUS

## 2011-10-31 MED ORDER — SODIUM CHLORIDE 0.9 % IJ SOLN: 9.0000 mL | INTRAMUSCULAR | Status: DC | PRN

## 2011-10-31 MED ORDER — ONDANSETRON HCL 4 MG/2ML IJ SOLN
INTRAMUSCULAR | Status: DC | PRN
  Administered 2011-10-31: 4 mg via INTRAVENOUS

## 2011-10-31 MED ORDER — LEVOTHYROXINE SODIUM 100 MCG PO TABS
100.0000 ug | ORAL_TABLET | Freq: Every day | ORAL | Status: DC
  Administered 2011-11-01: 100 ug via ORAL
  Filled 2011-10-31 (×2): qty 1

## 2011-10-31 MED ORDER — HEPARIN SODIUM (PORCINE) 1000 UNIT/ML IJ SOLN
INTRAMUSCULAR | Status: AC
  Filled 2011-10-31: qty 1

## 2011-10-31 MED ORDER — ACETAMINOPHEN 650 MG RE SUPP: 650.0000 mg | RECTAL | Status: DC | PRN

## 2011-10-31 MED ORDER — SODIUM CHLORIDE 0.9 % IV SOLN: 250.0000 mL | INTRAVENOUS | Status: DC

## 2011-10-31 MED ORDER — THROMBIN 20000 UNITS EX KIT
PACK | CUTANEOUS | Status: DC | PRN
  Administered 2011-10-31: 20000 [IU] via TOPICAL

## 2011-10-31 SURGICAL SUPPLY — 59 items
ADH SKN CLS APL DERMABOND .7 (GAUZE/BANDAGES/DRESSINGS) ×1
BAG DECANTER FOR FLEXI CONT (MISCELLANEOUS) ×2 IMPLANT
BLADE SURG ROTATE 9660 (MISCELLANEOUS) IMPLANT
BUR MATCHSTICK NEURO 3.0 LAGG (BURR) ×2 IMPLANT
CANISTER SUCTION 2500CC (MISCELLANEOUS) ×1 IMPLANT
CLAMP CONNECTOR PAR 5.5-5.5WD (Clamp) ×2 IMPLANT
CLOTH BEACON ORANGE TIMEOUT ST (SAFETY) ×2 IMPLANT
CONT SPEC 4OZ CLIKSEAL STRL BL (MISCELLANEOUS) ×4 IMPLANT
COVER BACK TABLE 24X17X13 BIG (DRAPES) IMPLANT
COVER TABLE BACK 60X90 (DRAPES) ×2 IMPLANT
DECANTER SPIKE VIAL GLASS SM (MISCELLANEOUS) ×1 IMPLANT
DERMABOND ADVANCED (GAUZE/BANDAGES/DRESSINGS) ×1
DERMABOND ADVANCED .7 DNX12 (GAUZE/BANDAGES/DRESSINGS) ×1 IMPLANT
DRAPE C-ARM 42X72 X-RAY (DRAPES) ×4 IMPLANT
DRAPE LAPAROTOMY 100X72X124 (DRAPES) ×2 IMPLANT
DRAPE POUCH INSTRU U-SHP 10X18 (DRAPES) ×2 IMPLANT
DRAPE PROXIMA HALF (DRAPES) ×1 IMPLANT
DURAPREP 26ML APPLICATOR (WOUND CARE) ×2 IMPLANT
ELECT REM PT RETURN 9FT ADLT (ELECTROSURGICAL) ×2
ELECTRODE REM PT RTRN 9FT ADLT (ELECTROSURGICAL) ×1 IMPLANT
GAUZE SPONGE 4X4 16PLY XRAY LF (GAUZE/BANDAGES/DRESSINGS) IMPLANT
GLOVE BIOGEL PI IND STRL 8.5 (GLOVE) ×2 IMPLANT
GLOVE BIOGEL PI INDICATOR 8.5 (GLOVE) ×4
GLOVE ECLIPSE 8.5 STRL (GLOVE) ×5 IMPLANT
GLOVE EXAM NITRILE LRG STRL (GLOVE) IMPLANT
GLOVE EXAM NITRILE MD LF STRL (GLOVE) IMPLANT
GLOVE EXAM NITRILE XL STR (GLOVE) IMPLANT
GLOVE EXAM NITRILE XS STR PU (GLOVE) IMPLANT
GLOVE SURG SS PI 8.0 STRL IVOR (GLOVE) ×4 IMPLANT
GOWN BRE IMP SLV AUR LG STRL (GOWN DISPOSABLE) IMPLANT
GOWN BRE IMP SLV AUR XL STRL (GOWN DISPOSABLE) ×1 IMPLANT
GOWN STRL REIN 2XL LVL4 (GOWN DISPOSABLE) ×6 IMPLANT
KIT BASIN OR (CUSTOM PROCEDURE TRAY) ×2 IMPLANT
KIT ROOM TURNOVER OR (KITS) ×2 IMPLANT
NEEDLE HYPO 22GX1.5 SAFETY (NEEDLE) ×2 IMPLANT
NS IRRIG 1000ML POUR BTL (IV SOLUTION) ×2 IMPLANT
PACK FOAM VITOSS 10CC (Orthopedic Implant) ×1 IMPLANT
PACK LAMINECTOMY NEURO (CUSTOM PROCEDURE TRAY) ×2 IMPLANT
PAD ARMBOARD 7.5X6 YLW CONV (MISCELLANEOUS) ×6 IMPLANT
PATTIES SURGICAL .5 X1 (DISPOSABLE) ×1 IMPLANT
PEEK PLIF NOVEL 9X25X10 (Peek) ×2 IMPLANT
ROD 45.5X40CM (Rod) ×2 IMPLANT
SCREW 35MM (Screw) ×2 IMPLANT
SCREW SET SPINAL STD HEXALOBE (Screw) ×2 IMPLANT
SPONGE GAUZE 4X4 12PLY (GAUZE/BANDAGES/DRESSINGS) ×2 IMPLANT
SPONGE LAP 4X18 X RAY DECT (DISPOSABLE) IMPLANT
SPONGE SURGIFOAM ABS GEL 100 (HEMOSTASIS) ×2 IMPLANT
SUT VIC AB 1 CT1 18XBRD ANBCTR (SUTURE) ×1 IMPLANT
SUT VIC AB 1 CT1 8-18 (SUTURE) ×2
SUT VIC AB 2-0 CP2 18 (SUTURE) ×2 IMPLANT
SUT VIC AB 3-0 SH 8-18 (SUTURE) ×3 IMPLANT
SYR 20ML ECCENTRIC (SYRINGE) ×2 IMPLANT
SYR 3ML LL SCALE MARK (SYRINGE) ×2 IMPLANT
TAPE CLOTH SURG 4X10 WHT LF (GAUZE/BANDAGES/DRESSINGS) ×1 IMPLANT
TOWEL OR 17X24 6PK STRL BLUE (TOWEL DISPOSABLE) ×2 IMPLANT
TOWEL OR 17X26 10 PK STRL BLUE (TOWEL DISPOSABLE) ×2 IMPLANT
TRAP SPECIMEN MUCOUS 40CC (MISCELLANEOUS) ×2 IMPLANT
TRAY FOLEY CATH 14FRSI W/METER (CATHETERS) ×2 IMPLANT
WATER STERILE IRR 1000ML POUR (IV SOLUTION) ×2 IMPLANT

## 2011-10-31 NOTE — Preoperative (Signed)
Beta Blockers   Reason not to administer Beta Blockers:Not Applicable 

## 2011-10-31 NOTE — Anesthesia Postprocedure Evaluation (Signed)
Anesthesia Post Note  [name]  Procedure(s) Performed: Procedure(s) (LRB): POSTERIOR LUMBAR FUSION 1 LEVEL (N/A)  Anesthesia type: general  Patient location: PACU  Post pain: Pain level controlled  Post assessment: Patient's Cardiovascular Status Stable  Last Vitals:  Filed Vitals:   10/31/11 1230  BP: 144/69  Pulse: 56  Temp: 36.7 C  Resp: 17    Post vital signs: Reviewed and stable  Level of consciousness: sedated  Complications: No apparent anesthesia complications

## 2011-10-31 NOTE — H&P (Signed)
MRI performed on the 1st of July.  The MRI demonstrates that [name] has advanced degenerative changes with significant bilobed rupture of the disc at L2-L3.  In talking to [facility] about her symptoms, she notes that prior to her visit, she was having right lower extremity pain.  She notes that rather spontaneously for one day the pain was gone completely.  She went about her business doing her activities.  By that evening she developed rather spontaneously the onset of severe left lower extremity pain.  The findings on the MRI are such that she does have a large extruded fragment of disc from L2-L3 behind the body of L2 on the right side.  Then, there is a disc herniation on the left side at L2-L3 that extends behind the pedicle of the L3 vertebrae.  It is likely that in the interval between one disc herniation, she had a tear in the ligament on the opposite side and then subsequently sustained the disc herniation on the left side, which likely explains the progression of her symptoms.  She is now having ongoing left lumbar radicular symptoms.  I noted that she has severe spinal stenosis in this region.  Fortunately, [name] is not having problems with bowel or bladder control, but she does note fatigue and weakness of her legs that is quite profound.    I indicated to [facility] that this area should be decompressed and fused.  From a technical standpoint, this is an add on fusion to what she had done before, but from a practical standpoint in terms of what she experiences, the followup and postop may be much like her previous surgery.  We discussed the propensity for other disease and I do note that there is a slight bulge in the adjacent disc, but certainly nothing bad enough to warrant additional surgery at the levels above.  I also note that [name] does have a central disc herniation at T11-T12 that indents the spinal canal, but does not cause any compression of the neural elements at that level.  All of these  conditions will need to be monitored, but in preparation for the surgery, we discussed her particular situation with her history of rheumatoid arthritis for over 20 years.  We discussed having her stop her Celebrex.  She will continue on her low dose of prednisone.  She will continue on Arava as this has been suggested by her rheumatologist.  She will, however, stop her Humira for a week or two before the surgery and will maintain it a week or two afterwards.  We will plan on maintaining her on some additional antibiotics during the postoperative course because of her history of infections.  The surgery will hopefully give her good relief like she had previously experienced and I am hopeful that we can get her through this surgery without much difficulty.           CHIEF COMPLAINT:   Neck and right upper extremity symptoms.    HISTORY OF PRESENT ILLNESS:  [name] is a 77 year old right-handed individual who has had a history of rheumatoid arthritis since 1992.  She notes that in January she experienced a severe pain radiating from the neck, across the right shoulder, and down into the right arm and right hand.  She notes at the severest the pain was between a 7 and a 9 and literally brought her to tears.  Over time it seems to have subsided substantially and she has not noted any significant weakness  in that right upper extremity.  She ultimately underwent an MRI of the cervical spine performed on 04/19/07 and this demonstrates the presence of some spondylosis at C4-5 with a 2 mm. spondylolisthesis, some facet arthritis and right lateral recess stenosis which narrows the opening for the C5 nerve root.  In addition, she has some spondylitic changes at C5-6 with some biforaminal stenosis.  No discrete soft tissue herniation is noted.  C6-7 reveals a soft tissue disc herniation paracentral on the left side, but does not cause any foraminal stenosis on either side. At C7-T1 there is a soft tissue disc  herniation also off to the left side.   Clinically, the patient notes that her symptoms have subsided largely. She has pain at a level of about a 2. She has not noted any weakness in the arm. She notes that she can do things overhead, has reasonable grip and does not have substantial pain.  In fact she notes that she has more pain in her back starting from the midthoracic spine to the lumbosacral junction, and in the upper lumbar spine.    PAST MEDICAL HISTORY:  Her past medical history is significant for rheumatoid arthritis.  Medications include an Aspirin a day, Balsalazide for ulcerative colitis, B5, Calcium plus Vitamin D, Celebrex, Folic Acid, Fosamax, Arava for arthritis, Methotrexate for arthritis, a multivitamin, Prednisone currently at 3.5 mg. daily, Prempro, Synthroid, and Vitamin D3.  The patient notes that she was taking some increased doses of Prednisone at the height of the pain and this did seem to help subside the severity of her symptoms.  Her previous surgeries include sinus surgery in 1983, partial thyroidectomy in 1997, and joint replacement in her left hand by Dr. Teressa Senter in 2006.    SOCIAL HISTORY:    She smokes maybe two or three cigarettes a day. She drinks alcohol on a social basis.  Height and weight have been stable at 5'7", 152 lbs.    REVIEW OF SYSTEMS:   Notable for wearing of glasses, use of a hearing aid, and some hearing loss, nasal congestion and drainage, sinus problems, back pain as noted in the   thoracic and upper lumbar spine, arthritis, and neck pain, all noted on a 14-point review sheet filled out in the office today.    PHYSICAL EXAMINATION:  She is an alert and oriented individual in no obvious distress. Her range of motion of her neck actually demonstrates quite good range with turning 60 degrees to either side, flexion and extension which is about 80% of normal. Axial compression does not reproduce any pain.  There is no tenderness in the supraclavicular  fossae.  No masses are noted.  Her motor strength appears good in the deltoids, biceps, triceps, grips and intrinsics in the upper extremities.  She does have significant joint pain particularly in the right wrist and the left elbow secondary to testing of strength; however, no discrete weakness is noted.  Reflexes are 1+ in the biceps, trace in the triceps, 2+ in the patellae bilaterally.    IMPRESSION:    The patient has evidence of an essentially normal neurologic examination with some modest degrees of spondylosis at C4-5, 5-6, and to a lesser extent at 6-7.  Insofar as she has overcome the worst of her symptoms, I would not suggest any active intervention.  I did suggest that she do activities as tolerated.  The problem with arthritis in the cervical spine with symptoms that she experiences that these problems can become reignited.  Certainly if she responds to the steroids fairly well, an increase in the dose course may be appropriate for her.  However, if the pain becomes severe and unrelenting, then surgical intervention may also want to be considered. I would be glad to re-evaluate her should she have worsening symptoms.  In the meantime we will provide a booklet on the care of the neck and the back for the patient's self-guided treatment.

## 2011-10-31 NOTE — Progress Notes (Signed)
Patient ID: [name], female   DOB: [birth year], 77 y.o.   MRN: [id number] Vital signs stable alert oriented doing well patient has already ambulated. Incision is clean and dry.  Not tolerating IV pain medication. Patient has been switched to Percocet by one of my partners. We'll followup in morning.

## 2011-10-31 NOTE — Progress Notes (Signed)
UR COMPLETED  

## 2011-10-31 NOTE — Transfer of Care (Signed)
Immediate Anesthesia Transfer of Care Note  [name]  Procedure(s) Performed: Procedure(s) (LRB): POSTERIOR LUMBAR FUSION 1 LEVEL (N/A)  Patient Location: PACU  Anesthesia Type: General  Level of Consciousness: awake, alert , oriented and patient cooperative  Airway & Oxygen Therapy: Patient Spontanous Breathing and Patient connected to face mask oxygen  Post-op Assessment: Report given to PACU RN, Post -op Vital signs reviewed and stable and Patient moving all extremities X 4  Post vital signs: Reviewed and stable  Complications: No apparent anesthesia complications

## 2011-10-31 NOTE — Addendum Note (Signed)
Addendum  created 10/31/11 1401 by Sharlet Salina, CRNA   Modules edited:Charges VN

## 2011-10-31 NOTE — Anesthesia Preprocedure Evaluation (Addendum)
Anesthesia Evaluation  Patient identified by MRN, date of birth, ID band Patient awake    Reviewed: Allergy & Precautions, H&P , NPO status , Patient's Chart, lab work & pertinent test results  History of Anesthesia Complications Negative for: history of anesthetic complications  Airway Mallampati: I TM Distance: >3 FB Neck ROM: Full    Dental  (+) Dental Advisory Given and Teeth Intact Upper bridge. :   Pulmonary pneumonia -, resolved,          Cardiovascular hypertension, Pt. on medications     Neuro/Psych Pain in L buttock, lower back, L thigh to knee. Also with tingling and numbness.     GI/Hepatic PUD,   Endo/Other  Hypothyroidism   Renal/GU      Musculoskeletal  (+) Arthritis -, Osteoarthritis,    Abdominal   Peds  Hematology   Anesthesia Other Findings   Reproductive/Obstetrics                         Anesthesia Physical Anesthesia Plan  ASA: II  Anesthesia Plan: General   Post-op Pain Management:    Induction: Intravenous  Airway Management Planned: Oral ETT  Additional Equipment:   Intra-op Plan:   Post-operative Plan: Extubation in OR  Informed Consent: I have reviewed the patients History and Physical, chart, labs and discussed the procedure including the risks, benefits and alternatives for the proposed anesthesia with the patient or authorized representative who has indicated his/her understanding and acceptance.   Dental advisory given  Plan Discussed with: CRNA  Anesthesia Plan Comments:         Anesthesia Quick Evaluation

## 2011-10-31 NOTE — Op Note (Signed)
Preoperative diagnosis: Lumbar stenosis L2-L3, status post decompression and fusion L3-L5 Postoperative diagnosis: Lumbar stenosis L2-L3, status post decompression and fusion L3-L5, lumbar radiculopathy Procedure: L2 laminectomy decompression of L2 and L3 nerve roots decompression of central spinal canal excision of herniated nucleus pulposus L2-L3. More work required than simple discectomy for interbody fusion. Posterior lumbar interbody arthrodesis with peek spacers local autograft and allograft, pedicle screw fixation L2-L3 with add-on construct using pedicle screws and rods. Posterior lateral arthrodesis L2-L3 with local autograft and allograft. Surgeon: Barnett Abu Assistant: Lelon Perla M.D. Indications: Patient is a 77 year old the visual who has had rheumatoid arthritis for a number of years she's had decompression and fusion from L3-L5 a number of years ago and has done well with the surgery however she is now developed adjacent level disease the large extruded fragment of disc at level of L2-L3 she's been advised regarding the need for surgical decompression and stabilization using a posterior interbody technique.  Procedure: The patient was brought to the operating room supine on a stretcher after the smooth induction of general endotracheal anesthesia she was turned prone. The back was prepped with alcohol and DuraPrep and draped in a sterile fashion. The previously made midline incision was reopened in its cephalad half and the dissection was carried down to the lumbar dorsal fascia. The interspinous spaces were then cleared and the subperiosteal dissection was performed to the lateral aspects of the wound to identify the previously placed hardware in its superior aspect. The L3 screws were identified and the tissue around this was removed. A portion of the rod inferior to the screw was also isolated and soft tissues were removed from around this area. Attention was then turned to the  interlaminar space at L2-L3. On the left side a large laminotomy was created removing the entirety of the facet joint the inferior portion of L2. The dissection was taken through the yellow ligament and the ligament was lifted. The common dural tube was explored and a large mass was encountered near the pedicle at the level of the disc space at L2-L3. This was incised and found to contain substantial fragments of degenerated disc material. These were removed in a piecemeal fashion. The disc space was then entered. He was noted to be substantial he collapsed. A series of disc shavers were used to enlarge the space to an 8 mm size. A complete discectomy was then performed at L2-L3 from this aperture. Attention was then turned to the right side where similar laminotomy was created in the disc space was also approached and evacuated. Care was taken to protect and decompressed each of the individual nerve roots then L2 and the superior aspect, and L3 an inferior aspect. Once the decompression was complete and the disc space was emptied interbody spacers were trialed it is felt that a 10 mm interbody spacer it would provide for nice distraction of the interspace and reduction of the retrolisthesis that had also occurred with this process. After the endplates were prepared at L2 and L3-1 millimeters peek spacer was placed first on the left side and the interspace was filled with autologous bone. On the opposite side a similar spacer was placed after a combination of the cost and autologous bone was placed into the interspace. Bone graft was retrieved from the laminectomy of L2 that was completed.  Pedicle entry sites were then chosen at the L2 vertebrae this was done with fluoroscopic guidance. 5.5 x 35 mm screws were placed from lateral to medial trajectory and  secured in position after a side clamp was placed on the previous rod. 35 mm precontoured rods were used to connect the side clamp to the pedicle screw of L2.  Final radiographs for confirmation of the construct was obtained on all was lying well the final torquing of the screw heads was performed at every level. Once this was accomplished lateral gutters which were previously decorticated were packed with autologous bone graft and the cost bone sponge. Once this was completed we checked hemostasis and the soft tissues in the lumbar dorsal fascia was closed with #1 Vicryl. 20 cc of half percent Marcaine was injected into the paraspinous musculature and fascia 2-0 Vicryl is used to close the subcutaneous tissues and 3-0 Vicryl was used to close the subcutaneous to kill her skin Dermabond was placed on the skin blood loss for the procedure was estimated at 300 cc. No Cell Saver blood was obtained. Patient was returned to the recovery room in stable condition.

## 2011-10-31 NOTE — Progress Notes (Signed)
Patient walked with brace and walker with RN to the bathroom, patient tolerated well, steady, neuro intacted

## 2011-11-01 DIAGNOSIS — M4716 Other spondylosis with myelopathy, lumbar region: Secondary | ICD-10-CM

## 2011-11-01 MED ORDER — MAGNESIUM HYDROXIDE 400 MG/5ML PO SUSP
30.0000 mL | Freq: Every day | ORAL | Status: DC | PRN
  Administered 2011-11-01: 30 mL via ORAL
  Filled 2011-11-01 (×2): qty 30

## 2011-11-01 MED ORDER — BISACODYL 10 MG RE SUPP: 10.0000 mg | Freq: Every day | RECTAL | Status: DC | PRN

## 2011-11-01 MED ORDER — KETOROLAC TROMETHAMINE 15 MG/ML IJ SOLN
15.0000 mg | Freq: Four times a day (QID) | INTRAMUSCULAR | Status: DC | PRN
  Filled 2011-11-01: qty 1

## 2011-11-01 MED ORDER — MAGNESIUM CITRATE PO SOLN
1.0000 | Freq: Once | ORAL | Status: AC
  Administered 2011-11-01: 1 via ORAL
  Filled 2011-11-01 (×2): qty 296

## 2011-11-01 MED FILL — Sodium Chloride IV Soln 0.9%: INTRAVENOUS | Qty: 1000 | Status: AC

## 2011-11-01 MED FILL — Heparin Sodium (Porcine) Inj 1000 Unit/ML: INTRAMUSCULAR | Qty: 30 | Status: AC

## 2011-11-01 MED FILL — Sodium Chloride Irrigation Soln 0.9%: Qty: 3000 | Status: AC

## 2011-11-01 NOTE — Progress Notes (Signed)
Physical Therapy Evaluation Patient Details Name: [name] MRN: [id number] DOB: 1934/03/16 Today's Date: 11/01/2011 Time: 1041-1100 PT Time Calculation (min): 19 min  PT Assessment / Plan / Recommendation Clinical Impression  77 yo feamle s/p lumbar fusion and decompression in setting of RA presents with decr functional mobility; Will benefit from PT to maximize independence and safety with mobility/amb/stairs to enable safe dc home    PT Assessment  Patient needs continued PT services    Follow Up Recommendations  Home health PT;Supervision/Assistance - 24 hour    Barriers to Discharge   Flight of steps to access bedroom (option for sleeping on couch on first floor, but couch is suboptimal for back surgery)    Equipment Recommendations  Rolling walker with 5" wheels;3 in 1 bedside comode (may already have equipment; need to verify)    Recommendations for Other Services     Frequency Min 5X/week    Precautions / Restrictions Precautions Precautions: Back Required Braces or Orthoses: Spinal Brace Spinal Brace: Applied in sitting position   Pertinent Vitals/Pain 6/10 back pain mostly incisional; reported walking helps back pain      Mobility  Bed Mobility Bed Mobility: Rolling Left;Left Sidelying to Sit;Sitting - Scoot to Edge of Bed Rolling Left: 5: Supervision;With rail Left Sidelying to Sit: 4: Min assist;With rails Sitting - Scoot to Edge of Bed: 4: Min guard;With rail Details for Bed Mobility Assistance: Very good log roll to Left; Assistance to elevate trunk from bed (pt reports l elbow RA pain pushing up ? Transfers Transfers: Sit to Stand;Stand to Sit Sit to Stand: 4: Min assist;From bed;From chair/3-in-1;With armrests;With upper extremity assist Stand to Sit: 4: Min assist;To chair/3-in-1 Details for Transfer Assistance: Cues for back prec and hand placement/safety Ambulation/Gait Ambulation/Gait Assistance: 4: Min guard (with and without physical  contact) Ambulation Distance (Feet): 150 Feet Assistive device: Rolling walker Ambulation/Gait Assistance Details: Pt clearly enjoyed walking and indicated less pain with walking; Still noted definite fatigue towards end of walk with need to get recliner and bring to her; Amb also limited by UE fatigue Gait Pattern: Step-through pattern    Exercises     PT Diagnosis: Difficulty walking;Acute pain;Generalized weakness  PT Problem List: Decreased strength;Decreased activity tolerance;Decreased mobility;Decreased knowledge of use of DME;Decreased knowledge of precautions;Pain PT Treatment Interventions: DME instruction;Gait training;Stair training;Functional mobility training;Therapeutic activities;Therapeutic exercise;Patient/family education   PT Goals Acute Rehab PT Goals PT Goal Formulation: With patient Time For Goal Achievement: 11/08/11 Potential to Achieve Goals: Good Pt will go Supine/Side to Sit: with modified independence;with HOB 0 degrees PT Goal: Supine/Side to Sit - Progress: Goal set today Pt will go Sit to Supine/Side: with modified independence;with HOB 0 degrees PT Goal: Sit to Supine/Side - Progress: Goal set today Pt will go Sit to Stand: with modified independence PT Goal: Sit to Stand - Progress: Goal set today Pt will go Stand to Sit: with modified independence PT Goal: Stand to Sit - Progress: Goal set today Pt will Ambulate: >150 feet;with modified independence;with least restrictive assistive device;with rolling walker PT Goal: Ambulate - Progress: Goal set today Pt will Go Up / Down Stairs: Flight;with modified independence;with rail(s) PT Goal: Up/Down Stairs - Progress: Goal set today Additional Goals Additional Goal #1: Pt will correctly verbalize and utilize back precautions with mobility activity PT Goal: Additional Goal #1 - Progress: Goal set today  Visit Information  Last PT Received On: 11/01/11 Assistance Needed: +1    Subjective Data   Subjective: Agreeable to amb; was  in bathroom on [facility] waiting for assist for an hour earlier today, unhappy by that Patient Stated Goal: to get home   Prior Functioning  Home Living Lives With: Spouse Available Help at Discharge: Family;Available 24 hours/day Type of Home: House Home Access: Stairs to enter [employer] of Steps: 4 (pordh) Entrance Stairs-Rails: Right Home Layout: Multi-level Alternate Level Stairs-Number of Steps: 12 Alternate Level Stairs-Rails: Left Bathroom Shower/Tub: Engineer, manufacturing systems: Standard Home Adaptive Equipment: Walker - rolling (RW from previous surgery?need to verify) Prior Function Level of Independence: Independent with assistive device(s) Able to Take Stairs?: Yes Communication Communication: No difficulties    Cognition  Overall Cognitive Status: Appears within functional limits for tasks assessed/performed Arousal/Alertness: Awake/alert Orientation Level: Appears intact for tasks assessed Behavior During Session: [facility] for tasks performed    Extremity/Trunk Assessment Right Upper Extremity Assessment RUE ROM/Strength/Tone: [facility] for tasks assessed Left Upper Extremity Assessment LUE ROM/Strength/Tone: WFL for tasks assessed Right Lower Extremity Assessment RLE ROM/Strength/Tone: Deficits RLE ROM/Strength/Tone Deficits: Generalized weakness, with dependence on UE supoort for transfers Left Lower Extremity Assessment LLE ROM/Strength/Tone: Deficits LLE ROM/Strength/Tone Deficits: Generalized weakness, with dependence on UE supoort for transfers   Balance    End of Session PT - End of Session Equipment Utilized During Treatment: Back brace Activity Tolerance: Patient tolerated treatment well;Patient limited by fatigue Patient left: with call bell/phone within reach (on [facility] in bathroom; RN notified) Nurse Communication: Mobility status  GP     Olen Pel [place], [place] [phone]  11/01/2011, 1:03  PM

## 2011-11-01 NOTE — Progress Notes (Signed)
Patient foley cath d/c'd this morning at 0645 per order.

## 2011-11-01 NOTE — Consult Note (Signed)
Physical Medicine and Rehabilitation Consult Reason for Consult: Lumbar stenosis with radiculopathy Referring Physician: Dr. Danielle Dess   HPI: [name] is a 77 y.o. right-handed female with history of rheumatoid arthritis with chronic steroids as well as lumbar fusion in 2011. Admitted 10/31/2011 with chronic low back pain radiating to bilateral lower extremities. X-rays and imaging revealed lumbar stenosis L2-3 with radiculopathy as well as rupture at L2-3. There was also a central disc herniation at T11-12 without any compression of the neural elements at that level. Underwent lumbar L2 laminectomy decompression of L2 and L3 nerve roots with excision of herniated nucleus pulposus L2-3 with discectomy and interbody fusion 10/31/2011 per Dr. Danielle Dess. Fitted with a back brace to be warm and out of bed. Postoperative pain management. Physical and occupational therapy pending. M.D. is requested physical medicine rehabilitation consult to consider inpatient rehabilitation services  Patient reports being up with physical therapy. Ambulated the length of the hallway with a walker. Previous lumbar surgery in 2011 2 levels was able to go home with husband's assistance. Review of Systems  Gastrointestinal: Positive for constipation.  Musculoskeletal: Positive for myalgias, back pain and joint pain.  All other systems reviewed and are negative.   Past Medical History  Diagnosis Date  . Ulcerative colitis   . Hypothyroidism   . Hypertension     pcp   dr reed   [facility]  . Complication of anesthesia 2009    htn with block  . Rheumatoid arthritis   . Pneumonia 2008  . Chronic bronchitis 10/31/2011    "prone to it; I have sinus/bronchitis problem probably q yr"  . Multiple skin tears 10/31/2011    "get them very easily; my skin is thin and I bruise easily"  . Chronic back pain     "my entire spine"   Past Surgical History  Procedure Date  . Back surgery   . Sinus surgery with instatrak     . Thyroidectomy, partial 1997  . Posterior fusion lumbar spine 10/31/2011    L2-3; 3-4  . Tonsillectomy     "when I was a small child"  . Functional endoscopic sinus surgery 1988  . Posterior fusion lumbar spine 11/2009    L3-4;  L4-5  . Joint replacement 2009; 2006    joints 2 fingers,rt hand; joint left thumb   History reviewed. No pertinent family history. Social History:  reports that she has been passively smoking Cigarettes.  She has a 12.5 pack-year smoking history. She has never used smokeless tobacco. She reports that she drinks about 2.4 ounces of alcohol per week. She reports that she does not use illicit drugs. Allergies: No Known Allergies Medications Prior to Admission  Medication Sig Dispense Refill  . adalimumab (HUMIRA) 40 MG/0.8ML injection Inject 40 mg into the skin once. Last taken on 10-16-2011      . aspirin 81 MG tablet Take 81 mg by mouth daily. Will be on hold one week before procedure      . BALSALAZIDE DISODIUM PO Take 3 capsules by mouth 3 (three) times daily.      . Calcium Carb-Cholecalciferol (CALCIUM + D3) 600-200 MG-UNIT TABS Take 1 tablet by mouth daily.      . Cholecalciferol (VITAMIN D3) 1000 UNITS CAPS Take 1,000 Units by mouth daily.      Marland Kitchen Conj Estrog-Medroxyprogest Ace (PREMPRO PO) Take 1 tablet by mouth daily. Estrogen 0.5mg /medroxyprogesterone 2.5mg       . estrogen, conjugated,-medroxyprogesterone (PREMPRO) 0.3-1.5 MG per tablet Take 1  tablet by mouth daily.      . folic acid (FOLVITE) 1 MG tablet Take 2 mg by mouth daily.      Marland Kitchen guaiFENesin (MUCINEX) 600 MG 12 hr tablet Take 1,200 mg by mouth daily as needed. Sinus relief      . hydrochlorothiazide (HYDRODIURIL) 25 MG tablet Take 25 mg by mouth daily.      Marland Kitchen HYDROcodone-acetaminophen (VICODIN) 5-500 MG per tablet Take 5-6 tablets by mouth daily.      Marland Kitchen latanoprost (XALATAN) 0.005 % ophthalmic solution Place 1 drop into both eyes at bedtime.      Marland Kitchen leflunomide (ARAVA) 20 MG tablet Take 20 mg by  mouth daily.      Marland Kitchen levothyroxine (SYNTHROID, LEVOTHROID) 100 MCG tablet Take 100 mcg by mouth daily.      . Multiple Vitamin (MULTIVITAMIN) tablet Take 1 tablet by mouth daily.      . Omega-3 Fatty Acids (FISH OIL) 1200 MG CAPS Take 1,200 mg by mouth daily.      . predniSONE (DELTASONE) 5 MG tablet Take 15 mg by mouth daily.        Home:    Functional History:   Functional Status:  Mobility:          ADL:    Cognition: Cognition Orientation Level: Oriented X4    Blood pressure 116/41, pulse 62, temperature 98.1 F (36.7 C), temperature source Oral, resp. rate 18, height 5\' 7"  (1.702 m), weight 63.277 kg (139 lb 8 oz), SpO2 100.00%. Physical Exam  Constitutional: She is oriented to person, place, and time. She appears well-developed.  HENT:  Head: Normocephalic.  Eyes:       Pupils round and reactive to light  Neck: Neck supple. No thyromegaly present.  Cardiovascular: Normal rate and regular rhythm.   Pulmonary/Chest: Breath sounds normal. No respiratory distress. She has no wheezes.  Abdominal: Bowel sounds are normal. She exhibits no distension. There is no tenderness.  Musculoskeletal:       Rheumatoid changes to hands  Neurological: She is alert and oriented to person, place, and time.  Skin:       Back incision is clean and dry  Psychiatric: She has a normal mood and affect.  5/5 in bilateral deltoid, biceps, triceps, grip Right lower extremityis 4/5 in hip flexor knee extensor ankle dorsiflexor Left lower extremity 3 minus/5 in the left hip flexor knee extensor ankle dorsiflexor Sensation is intact to light touch in bilateral upper and lower extremities  No results found for this or any previous visit (from the past 24 hour(s)). Dg Lumbar Spine 2-3 Views  10/31/2011  *RADIOLOGY REPORT*  Clinical Data: Back pain  DG C-ARM 1-60 MIN,LUMBAR SPINE - 2-3 VIEW  Comparison:  MRI 10/02/2011.  Findings: The previous lumbar fusion has been extended upward to include  L2-L3. Bilateral pedicle screws have been attached to the previous L3-L5 construct.  IMPRESSION: As above.   Original Report Authenticated By: Elsie Stain, M.D.    Dg C-arm 1-60 Min  10/31/2011  *RADIOLOGY REPORT*  Clinical Data: Back pain  DG C-ARM 1-60 MIN,LUMBAR SPINE - 2-3 VIEW  Comparison:  MRI 10/02/2011.  Findings: The previous lumbar fusion has been extended upward to include L2-L3. Bilateral pedicle screws have been attached to the previous L3-L5 construct.  IMPRESSION: As above.   Original Report Authenticated By: Elsie Stain, M.D.     Assessment/Plan: Diagnosis: lumbar spondylosis radiculopathy and fusion L2-L3. Postoperative day #1 doing quite well with mobility thus  far 1. Does the need for close, 24 hr/day medical supervision in concert with the patient's rehab needs make it unreasonable for this patient to be served in a less intensive setting? No 2. Co-Morbidities requiring supervision/potential complications: rheumatoid arthritis 3. Due to bowel management, skin/wound care and pain management, does the patient require 24 hr/day rehab nursing? Potentially 4. Does the patient require coordinated care of a physician, rehab nurse, PT (1-2 hrs/day, 5 days/week) and OT (1-2 hrs/day, 5 days/week) to address physical and functional deficits in the context of the above medical diagnosis(es)? Potentially Addressing deficits in the following areas: balance, endurance, locomotion, transferring, bathing, dressing and toileting 5. Can the patient actively participate in an intensive therapy program of at least 3 hrs of therapy per day at least 5 days per week? Yes 6. The potential for patient to make measurable gains while on inpatient rehab is good 7. Anticipated functional outcomes upon discharge from inpatient rehab are modified independent with PT, modified independent with ADLs with OT, not applicable with SLP. 8. Estimated rehab length of stay to reach the above functional goals is: 7  days 9. Does the patient have adequate social supports to accommodate these discharge functional goals? Yes 10. Anticipated D/C setting: Home 11. Anticipated post D/C treatments: HH therapy 12. Overall Rehab/Functional Prognosis: excellent  RECOMMENDATIONS: This patient's condition is appropriate for continued rehabilitative care in the following setting: I anticipate the patient should be a little home with home health therapy. Will have rehabilitation RN followup on therapy participation and progress. Should she not progress as expected a short CIR stay may be helpful Patient has agreed to participate in recommended program. Potentially Note that insurance prior authorization may be required for reimbursement for recommended care.  Comment:    11/01/2011

## 2011-11-01 NOTE — Evaluation (Signed)
Occupational Therapy Evaluation Patient Details Name: [name] MRN: [id number] DOB: [birth year] Today's Date: 11/01/2011 Time: 4098-1191 OT Time Calculation (min): 35 min  OT Assessment / Plan / Recommendation Clinical Impression  This 77 y.o. female admitted for Lumbar fusion, and has a history of RA.  Pt. demonstrates the below listed deficits and will benefit from OT to maximize safety and independence with BADLs to allow pt. to return home with supervision level.  Pt. may benefit from AE instruction/use.  Pt. indicating that she is very concerned about having BM and not becoming constipated as this occurred with last surgery - pt. mentioned this multiple times during eval    OT Assessment  Patient needs continued OT Services    Follow Up Recommendations  No OT follow up;Supervision - Intermittent    Barriers to Discharge None    Equipment Recommendations  None recommended by OT    Recommendations for Other Services    Frequency  Min 2X/week    Precautions / Restrictions Precautions Precautions: Back Precaution Comments: Able to state 3/3 precautions Required Braces or Orthoses: Spinal Brace Spinal Brace: Lumbar corset;Applied in sitting position Restrictions Weight Bearing Restrictions: No       ADL  Eating/Feeding: Simulated;Independent Where Assessed - Eating/Feeding: Bed level Grooming: Performed;Wash/dry hands;Supervision/safety Where Assessed - Grooming: Supported standing Upper Body Bathing: Simulated;Supervision/safety Where Assessed - Upper Body Bathing: Unsupported sitting Lower Body Bathing: Simulated;Moderate assistance Where Assessed - Lower Body Bathing: Supported sit to stand Upper Body Dressing: Simulated;Supervision/safety Where Assessed - Upper Body Dressing: Unsupported sitting Lower Body Dressing: Simulated;Performed;Maximal assistance Where Assessed - Lower Body Dressing: Supported sit to stand Toilet Transfer: Performed;Min guard Doctor, general practice Method: Sit to Barista: Comfort height toilet Toileting - Architect and Hygiene: Performed;Supervision/safety Where Assessed - Engineer, mining and Hygiene: Standing Equipment Used: Back brace;Rolling walker Transfers/Ambulation Related to ADLs: Ambulates with min guard assist ADL Comments: Pt. able to state precautions.  requires min verbal cues to not twist when grooming at sink, and to avoid bending or twisting with bed mobility.  Pt. able to don Rt. sock with mod A and left with total A by crossing ankles over knees.  May benefit from AE    OT Diagnosis: Generalized weakness;Acute pain  OT Problem List: Decreased strength;Decreased activity tolerance;Impaired balance (sitting and/or standing);Decreased knowledge of use of DME or AE;Decreased knowledge of precautions;Pain OT Treatment Interventions:     OT Goals Acute Rehab OT Goals OT Goal Formulation: With patient Time For Goal Achievement: 11/08/11 Potential to Achieve Goals: Good ADL Goals Pt Will Perform Lower Body Bathing: with supervision;Sit to stand from chair;Sit to stand from bed ADL Goal: Lower Body Bathing - Progress: Goal set today Pt Will Perform Lower Body Dressing: with supervision;with adaptive equipment;Sit to stand from chair;Sit to stand from bed ADL Goal: Lower Body Dressing - Progress: Goal set today Pt Will Transfer to Toilet: with supervision;with DME;Ambulation ADL Goal: Toilet Transfer - Progress: Goal set today Pt Will Perform Toileting - Clothing Manipulation: with supervision;Standing ADL Goal: Toileting - Clothing Manipulation - Progress: Goal set today Pt Will Perform Toileting - Hygiene: with supervision;Sit to stand from 3-in-1/toilet ADL Goal: Toileting - Hygiene - Progress: Goal set today Pt Will Perform Tub/Shower Transfer: Shower transfer;with supervision;Ambulation;Shower seat with back ADL Goal: Tub/Shower Transfer - Progress: Goal set  today  Visit Information  Last OT Received On: 11/01/11 Assistance Needed: +1    Subjective Data  Subjective: "I sometimes have a hard time with  those back precautions because I have to wiggle so much" Patient Stated Goal: To go home   Prior Functioning  Vision/Perception  Home Living Lives With: Spouse Available Help at Discharge: Family;Available 24 hours/day Type of Home: House Home Access: Stairs to enter [employer] of Steps: 4 Entrance Stairs-Rails: Right Home Layout: Multi-level Alternate Level Stairs-Number of Steps: 12 Alternate Level Stairs-Rails: Left Bathroom Shower/Tub: Engineer, manufacturing systems: Standard Bathroom Accessibility: Yes How Accessible: Accessible via walker Home Adaptive Equipment: Walker - rolling;Shower chair with back;Raised toilet seat with rails;Reacher Prior Function Level of Independence: Independent with assistive device(s) Able to Take Stairs?: Yes Driving: Yes Vocation: Retired Musician: No difficulties Dominant Hand: Right      Cognition  Overall Cognitive Status: Appears within functional limits for tasks assessed/performed Arousal/Alertness: Awake/alert Orientation Level: Appears intact for tasks assessed Behavior During Session: [facility] for tasks performed    Extremity/Trunk Assessment Right Upper Extremity Assessment RUE ROM/Strength/Tone: WFL for tasks assessed RUE Coordination: WFL - gross/fine motor (Pt. with deformity from RA - baseline) Left Upper Extremity Assessment LUE ROM/Strength/Tone: WFL for tasks assessed LUE Coordination: WFL - gross/fine motor (Pt. with deformity from RA - baseline)   Mobility Bed Mobility Bed Mobility: Rolling Right;Right Sidelying to Sit;Sitting - Scoot to Edge of Bed;Sit to Sidelying Right Rolling Right: 5: Supervision;With rail Right Sidelying to Sit: 4: Min guard;With rails (min verbal cues to prevent twisting) Sitting - Scoot to Edge of Bed: 4: Min  guard;With rail Sit to Sidelying Right: 4: Min assist;With rail;HOB flat (assist for feet) Details for Bed Mobility Assistance: min verbal cues for technique Transfers Transfers: Sit to Stand;Stand to Sit Sit to Stand: 4: Min guard;From bed;From toilet;With upper extremity assist Stand to Sit: 4: Min guard;To bed;To toilet   Exercise    Balance    End of Session OT - End of Session Equipment Utilized During Treatment: Back brace Activity Tolerance: Patient tolerated treatment well Patient left: in bed;with call bell/phone within reach;with bed alarm set  GO     Nitya Cauthon, Ursula Alert M 11/01/2011, 4:50 PM

## 2011-11-01 NOTE — Progress Notes (Signed)
I anticipate patient will be able to go home with home health therapy. Please call with any questions. [phone]

## 2011-11-01 NOTE — Progress Notes (Signed)
Subjective: Patient reports constipation. increased pain.  Objective: Vital signs in last 24 hours: Temp:  [97.5 F (36.4 C)-98.8 F (37.1 C)] 97.5 F (36.4 C) (08/21 1800) Pulse Rate:  [59-66] 66  (08/21 1800) Resp:  [18] 18  (08/21 1800) BP: (116-147)/(41-68) 140/68 mmHg (08/21 1800) SpO2:  [100 %] 100 % (08/21 1800) Weight:  [63.277 kg (139 lb 8 oz)] 63.277 kg (139 lb 8 oz) (08/20 2223)  Intake/Output from previous day: 08/20 0701 - 08/21 0700 In: 2000 [I.V.:2000] Out: 1635 [Urine:1035; Blood:600] Intake/Output this shift:    incision clean dry. motor function intact/  Lab Results: No results found for this basename: WBC:2,HGB:2,HCT:2,PLT:2 in the last 72 hours BMET No results found for this basename: NA:2,K:2,CL:2,CO2:2,GLUCOSE:2,BUN:2,CREATININE:2,CALCIUM:2 in the last 72 hours  Studies/Results: Dg Lumbar Spine 2-3 Views  10/31/2011  *RADIOLOGY REPORT*  Clinical Data: Back pain  DG C-ARM 1-60 MIN,LUMBAR SPINE - 2-3 VIEW  Comparison:  MRI 10/02/2011.  Findings: The previous lumbar fusion has been extended upward to include L2-L3. Bilateral pedicle screws have been attached to the previous L3-L5 construct.  IMPRESSION: As above.   Original Report Authenticated By: Elsie Stain, M.D.    Dg C-arm 1-60 Min  10/31/2011  *RADIOLOGY REPORT*  Clinical Data: Back pain  DG C-ARM 1-60 MIN,LUMBAR SPINE - 2-3 VIEW  Comparison:  MRI 10/02/2011.  Findings: The previous lumbar fusion has been extended upward to include L2-L3. Bilateral pedicle screws have been attached to the previous L3-L5 construct.  IMPRESSION: As above.   Original Report Authenticated By: Elsie Stain, M.D.     Assessment/Plan: sTABLE Post op. Doing fair. Constipation.  LOS: 1 day  dulcolox supp. observe/   Kynzee Devinney J 11/01/2011, 9:15 PM

## 2011-11-01 NOTE — Clinical Social Work Note (Signed)
CSW received consult for SNF. CSW reviewed chart and discussed pt with RN during progression. PT is recommending home health PT. RNCM is aware and following. CSW is signing off as no further needs identified. Please reconsult if a need arises prior to discharge.   Dede Query, MSW, Theresia Majors [phone]

## 2011-11-02 MED ORDER — DIAZEPAM 5 MG PO TABS: 5.0000 mg | ORAL_TABLET | Freq: Four times a day (QID) | ORAL | Status: AC | PRN

## 2011-11-02 MED ORDER — OXYCODONE-ACETAMINOPHEN 5-325 MG PO TABS: 1.0000 | ORAL_TABLET | ORAL | Status: AC | PRN

## 2011-11-02 NOTE — Progress Notes (Signed)
Patient is discharged today per MD order, d/c and medication instructions given and dully signed all questions answered.assessments remained unchanged prior to d/c.

## 2011-11-02 NOTE — Discharge Summary (Signed)
Physician Discharge Summary  Patient ID: ZOEE HEENEY MRN: [id number] DOB/AGE: 12-Sep-1934 77 y.o.  Admit date: 10/31/2011 Discharge date: 11/02/2011  Admission Diagnoses: Lumbar stenosis L2-L3 status post arthrodesis L3-L5 rheumatoid arthritis  Discharge Diagnoses: [name] stenosis L2-L3 status post arthrodesis L3-L5, rheumatoid arthritis Active Problems:  * No active hospital problems. *    Discharged Condition: good  Hospital Course: A shunt was admitted to undergo surgical decompression and stabilization of L2-L3 having had a previous decompression arthrodesis L3-L5. The patient had severe stenosis. She tolerated area motor function remains intact. She is discharged home.surgery well. Her incision has remained clean and dry  Consults: None  Significant Diagnostic Studies: MRI demonstrating severe stenosis L2-L3   Treatments: surgery:  Decompressio L2-L3 with fixation to previous hardware L3-L5 n arthrodesis  Discharge Exam: Blood pressure 102/52, pulse 60, temperature 98 F (36.7 C), temperature source Oral, resp. rate 18, height 5\' 7"  (1.702 m), weight 63.277 kg (139 lb 8 oz), SpO2 95.00%. Odor function intact in lower extremities incision clean and dry  Disposition:  discharge home   Discharge Orders    Future Orders Please Complete By Expires   Diet - low sodium heart healthy      Increase activity slowly      Call MD for:  redness, tenderness, or signs of infection (pain, swelling, redness, odor or green/yellow discharge around incision site)      Call MD for:  severe uncontrolled pain      Call MD for:  temperature >100.4      Discharge instructions      Comments:   Okay to shower. Do not apply salves or appointments to incision. No heavy lifting with the upper extremities greater than 15 pounds. May resume driving when not requiring pain medication and patient feels comfortable with doing so.     Medication List  As of 11/02/2011  9:33 AM   TAKE these medications           aspirin 81 MG tablet   Take 81 mg by mouth daily. Will be on hold one week before procedure      BALSALAZIDE DISODIUM PO   Take 3 capsules by mouth 3 (three) times daily.      Calcium + D3 600-200 MG-UNIT Tabs   Take 1 tablet by mouth daily.      diazepam 5 MG tablet   Commonly known as: VALIUM   Take 1 tablet (5 mg total) by mouth every 6 (six) hours as needed (Muscle spasm).      Fish Oil 1200 MG Caps   Take 1,200 mg by mouth daily.      folic acid 1 MG tablet   Commonly known as: FOLVITE   Take 2 mg by mouth daily.      guaiFENesin 600 MG 12 hr tablet   Commonly known as: MUCINEX   Take 1,200 mg by mouth daily as needed. Sinus relief      HUMIRA 40 MG/0.8ML injection   Generic drug: adalimumab   Inject 40 mg into the skin once. Last taken on 10-16-2011      hydrochlorothiazide 25 MG tablet   Commonly known as: HYDRODIURIL   Take 25 mg by mouth daily.      HYDROcodone-acetaminophen 5-500 MG per tablet   Commonly known as: VICODIN   Take 5-6 tablets by mouth daily.      latanoprost 0.005 % ophthalmic solution   Commonly known as: XALATAN   Place 1 drop into both eyes at  bedtime.      leflunomide 20 MG tablet   Commonly known as: ARAVA   Take 20 mg by mouth daily.      levothyroxine 100 MCG tablet   Commonly known as: SYNTHROID, LEVOTHROID   Take 100 mcg by mouth daily.      multivitamin tablet   Take 1 tablet by mouth daily.      oxyCODONE-acetaminophen 5-325 MG per tablet   Commonly known as: PERCOCET/ROXICET   Take 1-2 tablets by mouth every 4 (four) hours as needed for pain.      predniSONE 5 MG tablet   Commonly known as: DELTASONE   Take 15 mg by mouth daily.      PREMPRO PO   Take 1 tablet by mouth daily. Estrogen 0.5mg /medroxyprogesterone 2.5mg       estrogen (conjugated)-medroxyprogesterone 0.3-1.5 MG per tablet   Commonly known as: PREMPRO   Take 1 tablet by mouth daily.      Vitamin D3 1000 UNITS Caps   Take 1,000 Units by mouth  daily.             SignedStefani Dama 11/02/2011, 9:33 AM

## 2011-11-02 NOTE — Progress Notes (Signed)
Physical Therapy Treatment Patient Details Name: [name] MRN: [id number] DOB: 1934-05-03 Today's Date: 11/02/2011 Time: 4098-1191 PT Time Calculation (min): 20 min  PT Assessment / Plan / Recommendation Comments on Treatment Session  Patient making great progress. Patient working towards goals to DC home with husband. Patient is equiped and eager to DC    Follow Up Recommendations  Home health PT;Supervision/Assistance - 24 hour    Barriers to Discharge        Equipment Recommendations  None recommended by PT;None recommended by OT    Recommendations for Other Services    Frequency Min 5X/week   Plan Discharge plan remains appropriate;Frequency remains appropriate    Precautions / Restrictions Precautions Precautions: Back Precaution Comments: Patient able to state all precautions.  Required Braces or Orthoses: Spinal Brace Spinal Brace: Lumbar corset;Applied in sitting position   Pertinent Vitals/Pain 3/10 back pain    Mobility  Bed Mobility Rolling Right: 6: Modified independent (Device/Increase time) Right Sidelying to Sit: 6: Modified independent (Device/Increase time) Sitting - Scoot to Edge of Bed: 6: Modified independent (Device/Increase time) Transfers Sit to Stand: 5: Supervision;With upper extremity assist;From bed Stand to Sit: 5: Supervision;With upper extremity assist;With armrests;To chair/3-in-1 Details for Transfer Assistance: Supervision for safety Ambulation/Gait Ambulation/Gait Assistance: 5: Supervision Ambulation Distance (Feet): 300 Feet Assistive device: Rolling walker Ambulation/Gait Assistance Details: No cues required with gait.  Gait Pattern: Step-through pattern Stairs: Yes Stairs Assistance: 5: Supervision Stairs Assistance Details (indicate cue type and reason): cues for technique sideways Stair Management Technique: One rail Left;Sideways;Step to pattern Number of Stairs: 12     Exercises     PT Diagnosis:    PT Problem  List:   PT Treatment Interventions:     PT Goals Acute Rehab PT Goals PT Goal: Supine/Side to Sit - Progress: Met PT Goal: Sit to Stand - Progress: Progressing toward goal PT Goal: Stand to Sit - Progress: Progressing toward goal PT Goal: Ambulate - Progress: Progressing toward goal PT Goal: Up/Down Stairs - Progress: Progressing toward goal Additional Goals PT Goal: Additional Goal #1 - Progress: Progressing toward goal  Visit Information  Last PT Received On: 11/02/11 Assistance Needed: +1    Subjective Data      Cognition  Overall Cognitive Status: Appears within functional limits for tasks assessed/performed Arousal/Alertness: Awake/alert Orientation Level: Appears intact for tasks assessed Behavior During Session: [facility] for tasks performed    Balance     End of Session PT - End of Session Equipment Utilized During Treatment: Gait belt;Back brace Activity Tolerance: Patient tolerated treatment well Patient left: in chair;with call bell/phone within reach Nurse Communication: Mobility status   GP     Fredrich Birks 11/02/2011, 7:49 AM 11/02/2011 Fredrich Birks PTA [phone] pager [phone] office

## 2011-11-03 NOTE — Care Management Note (Signed)
    Page 1 of 1   11/03/2011     10:39:08 AM   CARE MANAGEMENT NOTE 11/03/2011  Patient:  [name], [name]   Account Number:  [id number]  Date Initiated:  11/03/2011  Documentation initiated by:  Onnie Boer  Subjective/Objective Assessment:   PT WAS ADMITTED FOR SURGERY     Action/Plan:   PROGRESSION OF CARE AND DISCHARGE PLANNING   Anticipated DC Date:  11/02/2011   Anticipated DC Plan:  HOME/SELF CARE      DC Planning Services  CM consult      Choice offered to / List presented to:             Status of service:  Completed, signed off Medicare Important Message given?   (If response is "NO", the following Medicare IM given date fields will be blank) Date Medicare IM given:   Date Additional Medicare IM given:    Discharge Disposition:  HOME/SELF CARE  Per UR Regulation:  Reviewed for med. necessity/level of care/duration of stay  If discussed at Long Length of Stay Meetings, dates discussed:    Comments:  11/02/11 Onnie Boer, RN, BSN 1500 PT WAS DC'D TO HOME WITH SELF CARE

## 2011-11-06 ENCOUNTER — Encounter (HOSPITAL_COMMUNITY): Payer: Self-pay

## 2011-11-22 ENCOUNTER — Encounter (HOSPITAL_COMMUNITY): Payer: Self-pay | Admitting: Physical Medicine and Rehabilitation

## 2011-11-22 ENCOUNTER — Inpatient Hospital Stay (HOSPITAL_COMMUNITY)
Admission: EM | Admit: 2011-11-22 | Discharge: 2011-11-24 | DRG: 603 | Disposition: A | Discharge status: Home / Self Care | Payer: Medicare Other | Attending: Internal Medicine | Admitting: Internal Medicine

## 2011-11-22 DIAGNOSIS — M069 Rheumatoid arthritis, unspecified: Secondary | ICD-10-CM | POA: Diagnosis present

## 2011-11-22 DIAGNOSIS — Z7982 Long term (current) use of aspirin: Secondary | ICD-10-CM

## 2011-11-22 DIAGNOSIS — M48061 Spinal stenosis, lumbar region without neurogenic claudication: Secondary | ICD-10-CM | POA: Diagnosis present

## 2011-11-22 DIAGNOSIS — K5190 Ulcerative colitis, unspecified, without complications: Secondary | ICD-10-CM | POA: Diagnosis present

## 2011-11-22 DIAGNOSIS — L0291 Cutaneous abscess, unspecified: Secondary | ICD-10-CM

## 2011-11-22 DIAGNOSIS — Z9889 Other specified postprocedural states: Secondary | ICD-10-CM

## 2011-11-22 DIAGNOSIS — M4800 Spinal stenosis, site unspecified: Secondary | ICD-10-CM | POA: Diagnosis present

## 2011-11-22 DIAGNOSIS — L0390 Cellulitis, unspecified: Secondary | ICD-10-CM | POA: Diagnosis present

## 2011-11-22 DIAGNOSIS — L02419 Cutaneous abscess of limb, unspecified: Principal | ICD-10-CM | POA: Diagnosis present

## 2011-11-22 DIAGNOSIS — Z79899 Other long term (current) drug therapy: Secondary | ICD-10-CM

## 2011-11-22 DIAGNOSIS — D899 Disorder involving the immune mechanism, unspecified: Secondary | ICD-10-CM

## 2011-11-22 DIAGNOSIS — I10 Essential (primary) hypertension: Secondary | ICD-10-CM | POA: Diagnosis present

## 2011-11-22 DIAGNOSIS — E039 Hypothyroidism, unspecified: Secondary | ICD-10-CM | POA: Diagnosis present

## 2011-11-22 LAB — COMPREHENSIVE METABOLIC PANEL
AST: 29 U/L (ref 0–37)
Albumin: 3.9 g/dL (ref 3.5–5.2)
Alkaline Phosphatase: 132 U/L — ABNORMAL HIGH (ref 39–117)
Chloride: 101 mEq/L (ref 96–112)
Potassium: 3.7 mEq/L (ref 3.5–5.1)
Sodium: 138 mEq/L (ref 135–145)
Total Bilirubin: 0.6 mg/dL (ref 0.3–1.2)

## 2011-11-22 LAB — CBC WITH DIFFERENTIAL/PLATELET
Basophils Absolute: 0 10*3/uL (ref 0.0–0.1)
Basophils Absolute: 0 10*3/uL (ref 0.0–0.1)
Basophils Relative: 0 % (ref 0–1)
Basophils Relative: 0 % (ref 0–1)
Eosinophils Absolute: 0 10*3/uL (ref 0.0–0.7)
Hemoglobin: 12.5 g/dL (ref 12.0–15.0)
Hemoglobin: 11.7 g/dL — ABNORMAL LOW (ref 12.0–15.0)
MCH: 31.5 pg (ref 26.0–34.0)
MCHC: 33.2 g/dL (ref 30.0–36.0)
MCHC: 33.3 g/dL (ref 30.0–36.0)
Monocytes Absolute: 0.9 10*3/uL (ref 0.1–1.0)
Monocytes Relative: 8 % (ref 3–12)
Neutro Abs: 10.3 10*3/uL — ABNORMAL HIGH (ref 1.7–7.7)
Neutrophils Relative %: 80 % — ABNORMAL HIGH (ref 43–77)
Neutrophils Relative %: 83 % — ABNORMAL HIGH (ref 43–77)
RDW: 15.7 % — ABNORMAL HIGH (ref 11.5–15.5)
RDW: 15.5 % (ref 11.5–15.5)

## 2011-11-22 LAB — BASIC METABOLIC PANEL
BUN: 26 mg/dL — ABNORMAL HIGH (ref 6–23)
Creatinine, Ser: 0.75 mg/dL (ref 0.50–1.10)
GFR calc Af Amer: >90 mL/min (ref >90)
GFR calc non Af Amer: 81 mL/min — ABNORMAL LOW (ref >90)
Potassium: 3.5 mEq/L (ref 3.5–5.1)

## 2011-11-22 LAB — CREATININE, SERUM
Creatinine, Ser: 0.77 mg/dL (ref 0.50–1.10)
GFR calc Af Amer: >90 mL/min (ref >90)
GFR calc non Af Amer: 81 mL/min — ABNORMAL LOW (ref >90)

## 2011-11-22 LAB — CBC
Hemoglobin: 11.5 g/dL — ABNORMAL LOW (ref 12.0–15.0)
MCH: 31.4 pg (ref 26.0–34.0)
Platelets: 262 10*3/uL (ref 150–400)
RBC: 3.66 MIL/uL — ABNORMAL LOW (ref 3.87–5.11)
WBC: 9.4 10*3/uL (ref 4.0–10.5)

## 2011-11-22 LAB — LACTIC ACID, PLASMA: Lactic Acid, Venous: 1.1 mmol/L (ref 0.5–2.2)

## 2011-11-22 MED ORDER — PREDNISONE 1 MG PO TABS: 2.0000 mg | ORAL_TABLET | Freq: Every day | ORAL | Status: DC

## 2011-11-22 MED ORDER — OMEGA-3-ACID ETHYL ESTERS 1 G PO CAPS
1.0000 g | ORAL_CAPSULE | Freq: Every day | ORAL | Status: DC
  Administered 2011-11-23 – 2011-11-24 (×2): 1 g via ORAL
  Filled 2011-11-22 (×3): qty 1

## 2011-11-22 MED ORDER — CONJ ESTROG-MEDROXYPROGEST ACE 0.3-1.5 MG PO TABS
1.0000 | ORAL_TABLET | Freq: Every day | ORAL | Status: DC
Start: 2011-11-22 — End: 2011-11-22

## 2011-11-22 MED ORDER — PIPERACILLIN-TAZOBACTAM 3.375 G IVPB
3.3750 g | Freq: Three times a day (TID) | INTRAVENOUS | Status: DC
  Administered 2011-11-22 – 2011-11-24 (×6): 3.375 g via INTRAVENOUS
  Filled 2011-11-22 (×8): qty 50

## 2011-11-22 MED ORDER — HYDROCHLOROTHIAZIDE 25 MG PO TABS
25.0000 mg | ORAL_TABLET | Freq: Every day | ORAL | Status: DC
  Administered 2011-11-23 – 2011-11-24 (×2): 25 mg via ORAL
  Filled 2011-11-22 (×2): qty 1

## 2011-11-22 MED ORDER — VANCOMYCIN HCL IN DEXTROSE 1-5 GM/200ML-% IV SOLN
1000.0000 mg | Freq: Once | INTRAVENOUS | Status: AC
  Administered 2011-11-22: 1000 mg via INTRAVENOUS
  Filled 2011-11-22: qty 200

## 2011-11-22 MED ORDER — GUAIFENESIN ER 600 MG PO TB12
1200.0000 mg | ORAL_TABLET | Freq: Every day | ORAL | Status: DC | PRN
  Administered 2011-11-23: 1200 mg via ORAL
  Filled 2011-11-22: qty 2

## 2011-11-22 MED ORDER — ESTROGENS CONJUGATED 0.3 MG PO TABS
0.3000 mg | ORAL_TABLET | Freq: Every day | ORAL | Status: DC
  Administered 2011-11-22 – 2011-11-24 (×3): 0.3 mg via ORAL
  Filled 2011-11-22 (×3): qty 1

## 2011-11-22 MED ORDER — BALSALAZIDE DISODIUM 750 MG PO CAPS
2250.0000 mg | ORAL_CAPSULE | Freq: Three times a day (TID) | ORAL | Status: DC
  Administered 2011-11-22 – 2011-11-24 (×6): 2250 mg via ORAL
  Filled 2011-11-22 (×7): qty 3

## 2011-11-22 MED ORDER — ALUM & MAG HYDROXIDE-SIMETH 200-200-20 MG/5ML PO SUSP: 30.0000 mL | Freq: Four times a day (QID) | ORAL | Status: DC | PRN

## 2011-11-22 MED ORDER — ONDANSETRON HCL 4 MG PO TABS: 4.0000 mg | ORAL_TABLET | Freq: Four times a day (QID) | ORAL | Status: DC | PRN

## 2011-11-22 MED ORDER — OXYCODONE-ACETAMINOPHEN 5-325 MG PO TABS
1.0000 | ORAL_TABLET | Freq: Four times a day (QID) | ORAL | Status: DC | PRN
  Administered 2011-11-22 – 2011-11-24 (×6): 1 via ORAL
  Filled 2011-11-22 (×6): qty 1

## 2011-11-22 MED ORDER — ASPIRIN EC 81 MG PO TBEC
81.0000 mg | DELAYED_RELEASE_TABLET | ORAL | Status: DC
  Administered 2011-11-23: 81 mg via ORAL
  Filled 2011-11-22: qty 1

## 2011-11-22 MED ORDER — DIAZEPAM 5 MG PO TABS
5.0000 mg | ORAL_TABLET | Freq: Four times a day (QID) | ORAL | Status: DC | PRN
  Administered 2011-11-23 – 2011-11-24 (×3): 5 mg via ORAL
  Filled 2011-11-22 (×3): qty 1

## 2011-11-22 MED ORDER — VITAMIN D3 25 MCG (1000 UNIT) PO TABS
1000.0000 [IU] | ORAL_TABLET | Freq: Every day | ORAL | Status: DC
  Administered 2011-11-23 – 2011-11-24 (×2): 1000 [IU] via ORAL
  Filled 2011-11-22 (×3): qty 1

## 2011-11-22 MED ORDER — SENNA 8.6 MG PO TABS
1.0000 | ORAL_TABLET | Freq: Two times a day (BID) | ORAL | Status: DC
  Administered 2011-11-23 (×2): 8.6 mg via ORAL
  Filled 2011-11-22 (×5): qty 1

## 2011-11-22 MED ORDER — CONJ ESTROG-MEDROXYPROGEST ACE 0.3-1.5 MG PO TABS
1.0000 | ORAL_TABLET | Freq: Every day | ORAL | Status: DC
Start: 2011-11-22 — End: 2011-11-22
  Filled 2011-11-22: qty 1

## 2011-11-22 MED ORDER — LATANOPROST 0.005 % OP SOLN
1.0000 [drp] | Freq: Every day | OPHTHALMIC | Status: DC
Start: 2011-11-22 — End: 2011-11-24
  Administered 2011-11-22 – 2011-11-23 (×2): 1 [drp] via OPHTHALMIC
  Filled 2011-11-22 (×2): qty 2.5

## 2011-11-22 MED ORDER — CALCIUM + D3 600-200 MG-UNIT PO TABS: 1.0000 | ORAL_TABLET | Freq: Every day | ORAL | Status: DC

## 2011-11-22 MED ORDER — PREDNISONE 5 MG PO TABS
7.0000 mg | ORAL_TABLET | Freq: Every day | ORAL | Status: DC
  Administered 2011-11-23: 5 mg via ORAL
  Administered 2011-11-23: 2 mg via ORAL
  Administered 2011-11-24: 7 mg via ORAL
  Filled 2011-11-22 (×4): qty 2

## 2011-11-22 MED ORDER — ADULT MULTIVITAMIN W/MINERALS CH
1.0000 | ORAL_TABLET | Freq: Every day | ORAL | Status: DC
  Administered 2011-11-23 – 2011-11-24 (×2): 1 via ORAL
  Filled 2011-11-22 (×3): qty 1

## 2011-11-22 MED ORDER — VANCOMYCIN HCL IN DEXTROSE 1-5 GM/200ML-% IV SOLN
1000.0000 mg | INTRAVENOUS | Status: DC
  Administered 2011-11-22 – 2011-11-24 (×3): 1000 mg via INTRAVENOUS
  Filled 2011-11-22 (×3): qty 200

## 2011-11-22 MED ORDER — BALSALAZIDE DISODIUM 750 MG PO CAPS: 2250.0000 mg | ORAL_CAPSULE | Freq: Three times a day (TID) | ORAL | Status: DC

## 2011-11-22 MED ORDER — OXYCODONE-ACETAMINOPHEN 5-325 MG PO TABS
1.0000 | ORAL_TABLET | Freq: Once | ORAL | Status: AC
  Administered 2011-11-22: 1 via ORAL
  Filled 2011-11-22: qty 1

## 2011-11-22 MED ORDER — LEVOTHYROXINE SODIUM 100 MCG PO TABS
100.0000 ug | ORAL_TABLET | Freq: Every day | ORAL | Status: DC
  Administered 2011-11-23 – 2011-11-24 (×2): 100 ug via ORAL
  Filled 2011-11-22 (×3): qty 1

## 2011-11-22 MED ORDER — VITAMIN D3 25 MCG (1000 UT) PO CAPS: 1000.0000 [IU] | ORAL_CAPSULE | Freq: Every day | ORAL | Status: DC

## 2011-11-22 MED ORDER — LEFLUNOMIDE 20 MG PO TABS
20.0000 mg | ORAL_TABLET | Freq: Every day | ORAL | Status: DC
  Administered 2011-11-23: 20 mg via ORAL
  Filled 2011-11-22 (×2): qty 1

## 2011-11-22 MED ORDER — ENOXAPARIN SODIUM 40 MG/0.4ML ~~LOC~~ SOLN
40.0000 mg | SUBCUTANEOUS | Status: DC
  Administered 2011-11-23: 40 mg via SUBCUTANEOUS
  Filled 2011-11-22 (×3): qty 0.4

## 2011-11-22 MED ORDER — FOLIC ACID 1 MG PO TABS
2.0000 mg | ORAL_TABLET | Freq: Every day | ORAL | Status: DC
  Administered 2011-11-23 – 2011-11-24 (×2): 2 mg via ORAL
  Filled 2011-11-22 (×2): qty 2

## 2011-11-22 MED ORDER — CALCIUM CARBONATE-VITAMIN D 500-200 MG-UNIT PO TABS
1.0000 | ORAL_TABLET | Freq: Every day | ORAL | Status: DC
  Administered 2011-11-23 – 2011-11-24 (×2): 1 via ORAL
  Filled 2011-11-22 (×3): qty 1

## 2011-11-22 MED ORDER — ONDANSETRON HCL 4 MG/2ML IJ SOLN: 4.0000 mg | Freq: Four times a day (QID) | INTRAMUSCULAR | Status: DC | PRN

## 2011-11-22 MED ORDER — MEDROXYPROGESTERONE ACETATE 2.5 MG PO TABS
1.2500 mg | ORAL_TABLET | Freq: Every day | ORAL | Status: DC
  Administered 2011-11-22 – 2011-11-24 (×3): 1.25 mg via ORAL
  Filled 2011-11-22 (×3): qty 0.5

## 2011-11-22 MED ORDER — ALBUTEROL SULFATE (5 MG/ML) 0.5% IN NEBU: 2.5000 mg | INHALATION_SOLUTION | RESPIRATORY_TRACT | Status: DC | PRN

## 2011-11-22 MED ORDER — SODIUM CHLORIDE 0.9 % IV SOLN
INTRAVENOUS | Status: DC
  Administered 2011-11-22: 20:00:00 via INTRAVENOUS

## 2011-11-22 MED ORDER — PREDNISONE 5 MG PO TABS
5.0000 mg | ORAL_TABLET | Freq: Every day | ORAL | Status: DC
  Filled 2011-11-22: qty 1

## 2011-11-22 NOTE — ED Notes (Signed)
Pt presents to department for evaluation of RLE swelling and pain. Onset this morning. Upon arrival R lower leg noted to be red, swollen and tender to palpation. 8/10 pain at the time. Ambulatory to triage. She is alert and oriented x4. Sent from Dr. Arbutus Leas office.

## 2011-11-22 NOTE — ED Notes (Signed)
Cellulitis noted to RLE from ankle to just distal to rgt knee area - warm to touch, slightly painful; cellulitis noted to dorsal, medial and lateral aspects of calf, none noted to plantar aspect; pitting edema noted to rgt ankle and foot; however, pt reports this is normal for her; palpable pedal pulses noted to rgt foot

## 2011-11-22 NOTE — Progress Notes (Signed)
ANTIBIOTIC CONSULT NOTE - INITIAL  Pharmacy Consult for Vancomycin/Zosyn Indication: RLE cellulitis  No Known Allergies  Patient Measurements: Height: 5' 6.93" (170 cm) Weight: 139 lb 8.8 oz (63.3 kg) IBW/kg (Calculated) : 61.44   Vital Signs: Temp: 98.6 F (37 C) (09/11 1237) Temp src: Oral (09/11 1237) BP: 142/57 mmHg (09/11 1707) Pulse Rate: 62  (09/11 1707) Intake/Output from previous day:   Intake/Output from this shift:    Labs:  Basename 11/22/11 1553 11/22/11 1237  WBC 11.7* 12.9*  HGB 11.7* 12.5  PLT 286 293  LABCREA -- --  CREATININE 0.75 0.77   Estimated Creatinine Clearance: 59.8 ml/min (by C-G formula based on Cr of 0.75). No results found for this basename: VANCOTROUGH:2,VANCOPEAK:2,VANCORANDOM:2,GENTTROUGH:2,GENTPEAK:2,GENTRANDOM:2,TOBRATROUGH:2,TOBRAPEAK:2,TOBRARND:2,AMIKACINPEAK:2,AMIKACINTROU:2,AMIKACIN:2, in the last 72 hours   Microbiology: Recent Results (from the past 720 hour(s))  SURGICAL PCR SCREEN     Status: Normal   Collection Time   10/24/11 10:29 AM      Component Value Range Status Comment   MRSA, PCR NEGATIVE  NEGATIVE Final    Staphylococcus aureus NEGATIVE  NEGATIVE Final     Medical History: Past Medical History  Diagnosis Date  . Ulcerative colitis   . Hypothyroidism   . Hypertension     pcp   dr reed   [facility]  . Complication of anesthesia 2009    htn with block  . Rheumatoid arthritis   . Pneumonia 2008  . Chronic bronchitis 10/31/2011    "prone to it; I have sinus/bronchitis problem probably q yr"  . Multiple skin tears 10/31/2011    "get them very easily; my skin is thin and I bruise easily"  . Chronic back pain     "my entire spine"    Medications:  Prescriptions prior to admission  Medication Sig Dispense Refill  . adalimumab (HUMIRA) 40 MG/0.8ML injection Inject 40 mg into the skin every 14 (fourteen) days.       Marland Kitchen aspirin EC 81 MG tablet Take 81 mg by mouth every other day.      . balsalazide  (COLAZAL) 750 MG capsule Take 2,250 mg by mouth 3 (three) times daily.      . Calcium Carb-Cholecalciferol (CALCIUM + D3) 600-200 MG-UNIT TABS Take 1 tablet by mouth daily.      . Cholecalciferol (VITAMIN D3) 1000 UNITS CAPS Take 1,000 Units by mouth daily.      . diazepam (VALIUM) 5 MG tablet Take 5 mg by mouth every 6 (six) hours as needed. For muscle spasms.      Marland Kitchen estrogen, conjugated,-medroxyprogesterone (PREMPRO) 0.3-1.5 MG per tablet Take 1 tablet by mouth daily.      . folic acid (FOLVITE) 1 MG tablet Take 2 mg by mouth daily.      Marland Kitchen guaiFENesin (MUCINEX) 600 MG 12 hr tablet Take 1,200 mg by mouth daily as needed. Sinus relief      . hydrochlorothiazide (HYDRODIURIL) 25 MG tablet Take 25 mg by mouth daily.      Marland Kitchen latanoprost (XALATAN) 0.005 % ophthalmic solution Place 1 drop into both eyes at bedtime.      Marland Kitchen leflunomide (ARAVA) 20 MG tablet Take 20 mg by mouth daily.      Marland Kitchen levothyroxine (SYNTHROID, LEVOTHROID) 100 MCG tablet Take 100 mcg by mouth daily.      . Multiple Vitamin (MULTIVITAMIN WITH MINERALS) TABS Take 1 tablet by mouth daily.      . Omega-3 Fatty Acids (FISH OIL) 1200 MG CAPS Take 1,200 mg by mouth  daily.      . oxyCODONE-acetaminophen (PERCOCET/ROXICET) 5-325 MG per tablet Take 1 tablet by mouth every 4 (four) hours as needed. For pain      . predniSONE (DELTASONE) 1 MG tablet Take 2 mg by mouth daily. Along with 5mg  to =7mg       . predniSONE (DELTASONE) 5 MG tablet Take 5 mg by mouth daily. With 2mg  to =7mg        Assessment: 77 y/o female patient admitted with right lower extremity erythema and pain requiring broad spectrum antibiotics for cellulitis.  Goal of Therapy:  Vancomycin trough level 10-15 mcg/ml  Plan:  Vancomycin 1g IV q24h. Zosyn 3.3.75g IV q8h and will monitor renal function. Measure antibiotic drug levels at steady [address], PharmD, [place] Pager [phone] 11/22/2011,7:14 PM

## 2011-11-22 NOTE — ED Provider Notes (Signed)
History     CSN: [id number]  Arrival date & time 11/22/11  1228   First MD Initiated Contact with Patient 11/22/11 1504      Chief Complaint  Patient presents with  . Leg Pain    (Consider location/radiation/quality/duration/timing/severity/associated sxs/prior treatment) HPIDoris J [name] is a 77 y.o. female history of rheumatoid arthritis who is on multiple immunosuppressive medications including daily prednisone presents with right lower extremity cellulitis - presents from her primary care physicians office. Patient says was not there yesterday and just came up overnight. He says currently her pain in the right lower extremities only there when it is palpated, and is 2-3/10, sharp, it feels warm, she has bilateral lower extremity edema which is chronic. No other alleviating or exacerbating factors and no other associated symptoms.  Past Medical History  Diagnosis Date  . Ulcerative colitis   . Hypothyroidism   . Hypertension     pcp   dr reed   [facility]  . Complication of anesthesia 2009    htn with block  . Rheumatoid arthritis   . Pneumonia 2008  . Chronic bronchitis 10/31/2011    "prone to it; I have sinus/bronchitis problem probably q yr"  . Multiple skin tears 10/31/2011    "get them very easily; my skin is thin and I bruise easily"  . Chronic back pain     "my entire spine"    Past Surgical History  Procedure Date  . Back surgery   . Sinus surgery with instatrak   . Thyroidectomy, partial 1997  . Posterior fusion lumbar spine 10/31/2011    L2-3; 3-4  . Tonsillectomy     "when I was a small child"  . Functional endoscopic sinus surgery 1988  . Posterior fusion lumbar spine 11/2009    L3-4;  L4-5  . Joint replacement 2009; 2006    joints 2 fingers,rt hand; joint left thumb    No family history on file.  History  Substance Use Topics  . Smoking status: Passive Smoke Exposure - Never Smoker -- 0.2 packs/day for 50 years    Types: Cigarettes  .  Smokeless tobacco: Never Used   Comment: 10/31/2011  offered smoking cessation materials; pt declines; "I puff; I've never really inhaled"  . Alcohol Use: 2.4 oz/week    2 Glasses of wine, 2 Cans of beer per week     10/31/2011 "2-4 beers/wine per week; an occasional scotch"    OB History    Grav Para Term Preterm Abortions TAB SAB Ect Mult Living                  Review of Systems At least 10pt or greater review of systems completed and are negative except where specified in the HPI.   Allergies  Review of patient's allergies indicates no known allergies.  Home Medications   Current Outpatient Rx  Name Route Sig Dispense Refill  . ADALIMUMAB 40 MG/0.8ML [place] KIT Subcutaneous Inject 40 mg into the skin every 14 (fourteen) days.     . ASPIRIN EC 81 MG PO TBEC Oral Take 81 mg by mouth every other day.    Marland Kitchen BALSALAZIDE DISODIUM 750 MG PO CAPS Oral Take 2,250 mg by mouth 3 (three) times daily.    Marland Kitchen CALCIUM + D3 600-200 MG-UNIT PO TABS Oral Take 1 tablet by mouth daily.    Marland Kitchen VITAMIN D3 1000 UNITS PO CAPS Oral Take 1,000 Units by mouth daily.    Marland Kitchen DIAZEPAM 5 MG  PO TABS Oral Take 5 mg by mouth every 6 (six) hours as needed. For muscle spasms.    Marland Kitchen CONJ ESTROG-MEDROXYPROGEST ACE 0.3-1.5 MG PO TABS Oral Take 1 tablet by mouth daily.    Marland Kitchen FOLIC ACID 1 MG PO TABS Oral Take 2 mg by mouth daily.    . GUAIFENESIN ER 600 MG PO TB12 Oral Take 1,200 mg by mouth daily as needed. Sinus relief    . HYDROCHLOROTHIAZIDE 25 MG PO TABS Oral Take 25 mg by mouth daily.    Marland Kitchen LATANOPROST 0.005 % OP SOLN Both Eyes Place 1 drop into both eyes at bedtime.    . LEFLUNOMIDE 20 MG PO TABS Oral Take 20 mg by mouth daily.    Marland Kitchen LEVOTHYROXINE SODIUM 100 MCG PO TABS Oral Take 100 mcg by mouth daily.    . ADULT MULTIVITAMIN W/MINERALS CH Oral Take 1 tablet by mouth daily.    Marland Kitchen FISH OIL 1200 MG PO CAPS Oral Take 1,200 mg by mouth daily.    . OXYCODONE-ACETAMINOPHEN 5-325 MG PO TABS Oral Take 1 tablet by mouth every 4  (four) hours as needed. For pain    . PREDNISONE 1 MG PO TABS Oral Take 2 mg by mouth daily. Along with 5mg  to =7mg     . PREDNISONE 5 MG PO TABS Oral Take 5 mg by mouth daily. With 2mg  to =7mg       BP 162/69  Pulse 70  Temp 98.6 F (37 C) (Oral)  Resp 18  SpO2 98%  Physical Exam   Nursing notes reviewed.  Electronic medical record reviewed. VITAL SIGNS:   Filed Vitals:   11/22/11 1237 11/22/11 1421  BP: 160/110 162/69  Pulse: 87 70  Temp: 98.6 F (37 C)   TempSrc: Oral   Resp: 18 18  SpO2: 98% 98%   CONSTITUTIONAL: Awake, oriented, appears non-toxic HENT: Atraumatic, normocephalic, oral mucosa pink and moist, airway patent. Nares patent without drainage. External ears normal. EYES: Conjunctiva clear, EOMI, PERRLA, bilateral arcus senilis NECK: Trachea midline, non-tender, supple CARDIOVASCULAR: Normal heart rate, Normal rhythm, No murmurs, rubs, gallops PULMONARY/CHEST: Clear to auscultation, no rhonchi, wheezes, or rales. Symmetrical breath sounds. Non-tender. ABDOMINAL: Non-distended, soft, non-tender - no rebound or guarding.  BS normal. BACK: Lumbar surgical scar, well-healed NEUROLOGIC: Non-focal, moving all four extremities, no gross sensory or motor deficits. EXTREMITIES: No clubbing, cyanosis. 2+ lower extremity edema, right lower extremity cellulitis, erythematous - not raised above surrounding tissue, warm to touch. SKIN: Warm, Dry   ED Course  Procedures (including critical care time)  Labs Reviewed  CBC WITH DIFFERENTIAL - Abnormal; Notable for the following:    WBC 12.9 (*)     RDW 15.7 (*)     Neutrophils Relative 80 (*)     Neutro Abs 10.3 (*)     All other components within normal limits  COMPREHENSIVE METABOLIC PANEL - Abnormal; Notable for the following:    Glucose, Bld 125 (*)     BUN 28 (*)     Alkaline Phosphatase 132 (*)     GFR calc non Af Amer 81 (*)     All other components within normal limits  CBC WITH DIFFERENTIAL  BASIC METABOLIC  PANEL  CULTURE, BLOOD (ROUTINE X 2)  CULTURE, BLOOD (ROUTINE X 2)  LACTIC ACID, PLASMA   No results found.   1. Cellulitis   2. Immunosuppression   3. HTN (hypertension)   4. Rheumatoid arthritis   5. Ulcerative colitis   6. Spinal stenosis  MDM  [name] is a 77 y.o. female presenting with a quickly evolving cellulitis of her RLE - she is relatively immunosuppressed.  Pt will require admission  -laboratory studies are unremarkable now for sepsis/systemic infection.  Vancomycin given.  D/W [facility] hospitalist for admission.        Jones Skene, MD 11/25/11 1743

## 2011-11-22 NOTE — H&P (Signed)
PATIENT DETAILS Name: [name] Age: 77 y.o. Sex: female Date of Birth: 1934-05-31 Admit Date: 11/22/2011 EAV:[id number],JYNWG F, MD   CHIEF COMPLAINT:  Right lower extremity erythema and pain  HPI: Patient is a 77 year old Caucasian female with a past medical history of rheumatoid arthritis-on multiple immunosuppressive agents, recent back surgery for spinal stenosis, chronic bilateral lower extremity edema, hypothyroidism who presents to the hospital for the above-noted complaints. Patient claims that when she woke up early this morning she observed that her right lower extremity was very inflamed and erythematous. It is mildly tender as well. She then went to Dr Verlee Rossetti office for a followup appointment, she was then referred to the ED for evaluation of right lower leg cellulitis. Given her immunocompromised state, I was subsequently called to admit this patient for further evaluation and treatment. Patient denies any fever, denies any chills. She denies any nausea vomiting. Denies any headache, chest pain, shortness of breath.    ALLERGIES:  No Known Allergies  PAST MEDICAL HISTORY: Past Medical History  Diagnosis Date  . Ulcerative colitis   . Hypothyroidism   . Hypertension     pcp   dr reed   [facility]  . Complication of anesthesia 2009    htn with block  . Rheumatoid arthritis   . Pneumonia 2008  . Chronic bronchitis 10/31/2011    "prone to it; I have sinus/bronchitis problem probably q yr"  . Multiple skin tears 10/31/2011    "get them very easily; my skin is thin and I bruise easily"  . Chronic back pain     "my entire spine"    PAST SURGICAL HISTORY: Past Surgical History  Procedure Date  . Back surgery   . Sinus surgery with instatrak   . Thyroidectomy, partial 1997  . Posterior fusion lumbar spine 10/31/2011    L2-3; 3-4  . Tonsillectomy     "when I was a small child"  . Functional endoscopic sinus surgery 1988  . Posterior fusion lumbar spine  11/2009    L3-4;  L4-5  . Joint replacement 2009; 2006    joints 2 fingers,rt hand; joint left thumb    MEDICATIONS AT HOME: Prior to Admission medications   Medication Sig Start Date End Date Taking? Authorizing Provider  adalimumab (HUMIRA) 40 MG/0.8ML injection Inject 40 mg into the skin every 14 (fourteen) days.    Yes Historical Provider, MD  aspirin EC 81 MG tablet Take 81 mg by mouth every other day.   Yes Historical Provider, MD  balsalazide (COLAZAL) 750 MG capsule Take 2,250 mg by mouth 3 (three) times daily.   Yes Historical Provider, MD  Calcium Carb-Cholecalciferol (CALCIUM + D3) 600-200 MG-UNIT TABS Take 1 tablet by mouth daily.   Yes Historical Provider, MD  Cholecalciferol (VITAMIN D3) 1000 UNITS CAPS Take 1,000 Units by mouth daily.   Yes Historical Provider, MD  diazepam (VALIUM) 5 MG tablet Take 5 mg by mouth every 6 (six) hours as needed. For muscle spasms.   Yes Historical Provider, MD  estrogen, conjugated,-medroxyprogesterone (PREMPRO) 0.3-1.5 MG per tablet Take 1 tablet by mouth daily.   Yes Historical Provider, MD  folic acid (FOLVITE) 1 MG tablet Take 2 mg by mouth daily.   Yes Historical Provider, MD  guaiFENesin (MUCINEX) 600 MG 12 hr tablet Take 1,200 mg by mouth daily as needed. Sinus relief   Yes Historical Provider, MD  hydrochlorothiazide (HYDRODIURIL) 25 MG tablet Take 25 mg by mouth daily.   Yes Historical Provider,  MD  latanoprost (XALATAN) 0.005 % ophthalmic solution Place 1 drop into both eyes at bedtime.   Yes Historical Provider, MD  leflunomide (ARAVA) 20 MG tablet Take 20 mg by mouth daily.   Yes Historical Provider, MD  levothyroxine (SYNTHROID, LEVOTHROID) 100 MCG tablet Take 100 mcg by mouth daily.   Yes Historical Provider, MD  Multiple Vitamin (MULTIVITAMIN WITH MINERALS) TABS Take 1 tablet by mouth daily.   Yes Historical Provider, MD  Omega-3 Fatty Acids (FISH OIL) 1200 MG CAPS Take 1,200 mg by mouth daily.   Yes Historical Provider, MD    oxyCODONE-acetaminophen (PERCOCET/ROXICET) 5-325 MG per tablet Take 1 tablet by mouth every 4 (four) hours as needed. For pain   Yes Historical Provider, MD  predniSONE (DELTASONE) 1 MG tablet Take 2 mg by mouth daily. Along with 5mg  to =7mg    Yes Historical Provider, MD  predniSONE (DELTASONE) 5 MG tablet Take 5 mg by mouth daily. With 2mg  to =7mg    Yes Historical Provider, MD    FAMILY HISTORY: No family history of CAD  SOCIAL HISTORY:  reports that she has been passively smoking Cigarettes.  She has a 12.5 pack-year smoking history. She has never used smokeless tobacco. She reports that she drinks about 2.4 ounces of alcohol per week. She reports that she does not use illicit drugs.  REVIEW OF SYSTEMS:  Constitutional:   No  weight loss, night sweats,  Fevers, chills, fatigue.  HEENT:    No headaches, Difficulty swallowing,Tooth/dental problems,Sore throat,  No sneezing, itching, ear ache, nasal congestion, post nasal drip,   Cardio-vascular: No chest pain,  Orthopnea, PND, swelling in lower extremities, anasarca,  dizziness, palpitations  GI:  No heartburn, indigestion, abdominal pain, nausea, vomiting, diarrhea, change in  bowel habits, loss of appetite  Resp: No shortness of breath with exertion or at rest.  No excess mucus, no productive cough, No non-productive cough,  No coughing up of blood.No change in color of mucus.No wheezing.No chest wall deformity  Skin:  no rash or lesions.  GU:  no dysuria, change in color of urine, no urgency or frequency.  No flank pain.  Musculoskeletal: No joint pain or swelling.  No decreased range of motion.  No back pain.  Psych: No change in mood or affect. No depression or anxiety.  No memory loss.   PHYSICAL EXAM: Blood pressure 142/57, pulse 62, temperature 98.6 F (37 C), temperature source Oral, resp. rate 18, SpO2 100.00%.  General appearance :Awake, alert, not in any distress. Speech Clear. Not toxic Looking HEENT:  Atraumatic and Normocephalic, pupils equally reactive to light and accomodation Neck: supple, no JVD. No cervical lymphadenopathy.  Chest:Good air entry bilaterally, no added sounds  CVS: S1 S2 regular, no murmurs.  Abdomen: Bowel sounds present, Non tender and not distended with no gaurding, rigidity or rebound. Extremities: B/L Lower Ext shows 1-2+ edema, both legs are warm to touch right>left, with  dorsalis pedis pulses palpable. Right lower extremity-is erythematous from just above the ankle area to the lower aspect of the knee. Leg is not tense, sensation is intact. Erythema is limited to the anterior aspect and is non-circumferential.. Neurology: Awake alert, and oriented X 3, CN II-XII intact, Non focal Skin:No Rash Wounds:N/A  LABS ON ADMISSION:   Basename 11/22/11 1553 11/22/11 1237  NA 138 138  K 3.5 3.7  CL 101 101  CO2 26 26  GLUCOSE 109* 125*  BUN 26* 28*  CREATININE 0.75 0.77  CALCIUM 9.2 9.7  MG -- --  PHOS -- --    Basename 11/22/11 1237  AST 29  ALT 11  ALKPHOS 132*  BILITOT 0.6  PROT 6.8  ALBUMIN 3.9   No results found for this basename: LIPASE:2,AMYLASE:2 in the last 72 hours  Basename 11/22/11 1553 11/22/11 1237  WBC 11.7* 12.9*  NEUTROABS 9.7* 10.3*  HGB 11.7* 12.5  HCT 35.1* 37.6  MCV 94.4 95.4  PLT 286 293   No results found for this basename: CKTOTAL:3,CKMB:3,CKMBINDEX:3,TROPONINI:3 in the last 72 hours No results found for this basename: DDIMER:2 in the last 72 hours No components found with this basename: POCBNP:3   RADIOLOGIC STUDIES ON ADMISSION: No results found.  ASSESSMENT AND PLAN: Present on Admission:  .Cellulitis-right lower extremity  -Admit to MedSurg  -Empiric vancomycin and Zosyn given immunocompromise state  -Will mark the cellulitic area  -Doubt DVT-but will get a Doppler ruled out  -Keep right lower extremity elevated  .HTN (hypertension) -Continue with HCTZ   .Rheumatoid arthritis -Continue with current dozing  off prednisone and other immunosuppressive agents   .Ulcerative colitis -No abdominal pain or diarrhea, seems stable   .Spinal stenosis -Recent back surgery -As needed narcotics  Further plan will depend as patient's clinical course evolves and further radiologic and laboratory data become available. Patient will be monitored closely.  DVT Prophylaxis: -Prophylactic Lovenox  Code Status: Full Code  Total time spent for admission equals 45 minutes.  [facility] Pager [phone]  If 7PM-7AM, please contact night-coverage [email or web address] Password [facility] 11/22/2011, 6:47 PM

## 2011-11-23 DIAGNOSIS — M79609 Pain in unspecified limb: Secondary | ICD-10-CM

## 2011-11-23 LAB — CBC
Platelets: 271 10*3/uL (ref 150–400)
RBC: 3.51 MIL/uL — ABNORMAL LOW (ref 3.87–5.11)
RDW: 15.9 % — ABNORMAL HIGH (ref 11.5–15.5)
WBC: 7.3 10*3/uL (ref 4.0–10.5)

## 2011-11-23 LAB — BASIC METABOLIC PANEL
CO2: 28 mEq/L (ref 19–32)
Chloride: 105 mEq/L (ref 96–112)
Creatinine, Ser: 0.7 mg/dL (ref 0.50–1.10)
GFR calc Af Amer: >90 mL/min (ref >90)
Sodium: 142 mEq/L (ref 135–145)

## 2011-11-23 LAB — PROTIME-INR
INR: 1.03 (ref 0.00–1.49)
Prothrombin Time: 13.7 seconds (ref 11.6–15.2)

## 2011-11-23 MED ORDER — POTASSIUM CHLORIDE CRYS ER 20 MEQ PO TBCR
40.0000 meq | EXTENDED_RELEASE_TABLET | Freq: Once | ORAL | Status: AC
  Administered 2011-11-23: 40 meq via ORAL
  Filled 2011-11-23: qty 2

## 2011-11-23 NOTE — Progress Notes (Signed)
PATIENT DETAILS Name: [name] Age: 77 y.o. Sex: female Date of Birth: January 07, 1935 Admit Date: 11/22/2011 Admitting Physician Dewayne Shorter Levora Dredge, MD UJW:[id number],GNFAO F, MD  Subjective: Right lower extremity slightly better-erythema somewhat better-area now marked  Assessment/Plan: Principal Problem:  *Cellulitis --remains afebrile, No leukocytosis -some decrease in swelling, mild decrease in erythema -c/w empiric Vanco/Zosyn -keep right leg elevated -prelim  Active Problems:  HTN (hypertension) -controlled with HCTZ   Rheumatoid arthritis -Stable -c/w prednisone -spoke with Dr Clydene Laming to hold Leflunomide till off antibiotics. Needs to off Humira as well    Ulcerative colitis -stable -on Balsalazide   Spinal stenosis -stable following surgery -prn narcotics  Disposition: Remain inpatient  DVT Prophylaxis: Prophylactic Lovenox   Code Status: Full code   Procedures:  None  CONSULTS:  Phone-consult-Dr Dierdre Forth  PHYSICAL EXAM: Vital signs in last 24 hours: Filed Vitals:   11/22/11 1707 11/22/11 2112 11/23/11 0421 11/23/11 0940  BP: 142/57 144/57 157/62 122/53  Pulse: 62 60 52 72  Temp:  98 F (36.7 C) 98.4 F (36.9 C) 99.2 F (37.3 C)  TempSrc:  Oral Oral Oral  Resp:  16 16 18   Height:  5' 7.5" (1.715 m)    Weight: 62.9 kg (138 lb 10.7 oz)  62.8 kg (138 lb 7.2 oz)   SpO2: 100% 97% 98% 97%    Weight change:  Body mass index is 21.36 kg/(m^2).   Gen Exam: Awake and alert with clear speech.  Neck: Supple, No JVD.   Chest: B/L Clear.   CVS: S1 S2 Regular, no murmurs.  Abdomen: soft, BS +, non tender, non distended.  Extremities: no edema, lower extremities warm to touch.Right lower ext-some decrease in swelling and erythema-area now demarcated. Neurologic: Non Focal.   Skin: No Rash.   Wounds: N/A.    Intake/Output from previous day:  Intake/Output Summary (Last 24 hours) at 11/23/11 1312 Last data filed at 11/23/11 0900  Gross per 24 hour  Intake 703.33 ml  Output      0 ml  Net 703.33 ml     LAB RESULTS: CBC  Lab 11/23/11 0550 11/22/11 1940 11/22/11 1553 11/22/11 1237  WBC 7.3 9.4 11.7* 12.9*  HGB 10.7* 11.5* 11.7* 12.5  HCT 33.5* 35.0* 35.1* 37.6  PLT 271 262 286 293  MCV 95.4 95.6 94.4 95.4  MCH 30.5 31.4 31.5 31.7  MCHC 31.9 32.9 33.3 33.2  RDW 15.9* 15.7* 15.5 15.7*  LYMPHSABS -- -- 1.0 1.7  MONOABS -- -- 0.9 0.8  EOSABS -- -- 0.0 0.0  BASOSABS -- -- 0.0 0.0  BANDABS -- -- -- --    Chemistries   Lab 11/23/11 0550 11/22/11 1940 11/22/11 1553 11/22/11 1237  NA 142 -- 138 138  K 3.1* -- 3.5 3.7  CL 105 -- 101 101  CO2 28 -- 26 26  GLUCOSE 85 -- 109* 125*  BUN 15 -- 26* 28*  CREATININE 0.70 0.77 0.75 0.77  CALCIUM 8.8 -- 9.2 9.7  MG -- -- -- --    CBG:  Lab 11/23/11 0745  GLUCAP 88    GFR Estimated Creatinine Clearance: 61.2 ml/min (by C-G formula based on Cr of 0.7).  Coagulation profile  Lab 11/23/11 0550  INR 1.03  PROTIME --    Cardiac Enzymes No results found for this basename: CK:3,CKMB:3,TROPONINI:3,MYOGLOBIN:3 in the last 168 hours  No components found with this basename: POCBNP:3 No results found for this basename: DDIMER:2 in the last 72 hours No results found for this basename:  HGBA1C:2 in the last 72 hours No results found for this basename: CHOL:2,HDL:2,LDLCALC:2,TRIG:2,CHOLHDL:2,LDLDIRECT:2 in the last 72 hours No results found for this basename: TSH,T4TOTAL,FREET3,T3FREE,THYROIDAB in the last 72 hours No results found for this basename: VITAMINB12:2,FOLATE:2,FERRITIN:2,TIBC:2,IRON:2,RETICCTPCT:2 in the last 72 hours No results found for this basename: LIPASE:2,AMYLASE:2 in the last 72 hours  Urine Studies No results found for this basename: UACOL:2,UAPR:2,USPG:2,UPH:2,UTP:2,UGL:2,UKET:2,UBIL:2,UHGB:2,UNIT:2,UROB:2,ULEU:2,UEPI:2,UWBC:2,URBC:2,UBAC:2,CAST:2,CRYS:2,UCOM:2,BILUA:2 in the last 72 hours  MICROBIOLOGY: Recent Results (from the past 240  hour(s))  CULTURE, BLOOD (ROUTINE X 2)     Status: Normal (Preliminary result)   Collection Time   11/22/11  4:00 PM      Component Value Range Status Comment   Specimen Description BLOOD ARM RIGHT   Final    Special Requests BOTTLES DRAWN AEROBIC AND ANAEROBIC 10CC   Final    Culture  Setup Time 11/22/2011 22:48   Final    Culture     Final    Value:        BLOOD CULTURE RECEIVED NO GROWTH TO DATE CULTURE WILL BE HELD FOR 5 DAYS BEFORE ISSUING A FINAL NEGATIVE REPORT   Report Status PENDING   Incomplete   CULTURE, BLOOD (ROUTINE X 2)     Status: Normal (Preliminary result)   Collection Time   11/22/11  4:05 PM      Component Value Range Status Comment   Specimen Description BLOOD HAND RIGHT   Final    Special Requests BOTTLES DRAWN AEROBIC ONLY 10CC   Final    Culture  Setup Time 11/22/2011 22:46   Final    Culture     Final    Value:        BLOOD CULTURE RECEIVED NO GROWTH TO DATE CULTURE WILL BE HELD FOR 5 DAYS BEFORE ISSUING A FINAL NEGATIVE REPORT   Report Status PENDING   Incomplete     RADIOLOGY STUDIES/RESULTS: Dg Lumbar Spine 2-3 Views  10/31/2011  *RADIOLOGY REPORT*  Clinical Data: Back pain  DG C-ARM 1-60 MIN,LUMBAR SPINE - 2-3 VIEW  Comparison:  MRI 10/02/2011.  Findings: The previous lumbar fusion has been extended upward to include L2-L3. Bilateral pedicle screws have been attached to the previous L3-L5 construct.  IMPRESSION: As above.   Original Report Authenticated By: Elsie Stain, M.D.    Dg C-arm 1-60 Min  10/31/2011  *RADIOLOGY REPORT*  Clinical Data: Back pain  DG C-ARM 1-60 MIN,LUMBAR SPINE - 2-3 VIEW  Comparison:  MRI 10/02/2011.  Findings: The previous lumbar fusion has been extended upward to include L2-L3. Bilateral pedicle screws have been attached to the previous L3-L5 construct.  IMPRESSION: As above.   Original Report Authenticated By: Elsie Stain, M.D.     MEDICATIONS: Scheduled Meds:   . aspirin EC  81 mg Oral QODAY  . balsalazide  2,250 mg Oral  TID  . calcium-vitamin D  1 tablet Oral Daily  . cholecalciferol  1,000 Units Oral Daily  . enoxaparin (LOVENOX) injection  40 mg Subcutaneous Q24H  . estrogens (conjugated)  0.3 mg Oral Daily   And  . medroxyPROGESTERone  1.25 mg Oral Daily  . folic acid  2 mg Oral Daily  . hydrochlorothiazide  25 mg Oral Daily  . latanoprost  1 drop Both Eyes QHS  . leflunomide  20 mg Oral Daily  . levothyroxine  100 mcg Oral QAC breakfast  . multivitamin with minerals  1 tablet Oral Daily  . omega-3 acid ethyl esters  1 g Oral Daily  . oxyCODONE-acetaminophen  1 tablet  Oral Once  . piperacillin-tazobactam (ZOSYN)  IV  3.375 g Intravenous Q8H  . potassium chloride  40 mEq Oral Once  . predniSONE  7 mg Oral Q breakfast  . senna  1 tablet Oral BID  . vancomycin  1,000 mg Intravenous Once  . vancomycin  1,000 mg Intravenous Q24H  . DISCONTD: balsalazide  2,250 mg Oral TID  . DISCONTD: Calcium + D3  1 tablet Oral Daily  . DISCONTD: estrogen (conjugated)-medroxyprogesterone  1 tablet Oral Daily  . DISCONTD: estrogen (conjugated)-medroxyprogesterone  1 tablet Oral Daily  . DISCONTD: predniSONE  2 mg Oral Daily  . DISCONTD: predniSONE  5 mg Oral Q breakfast  . DISCONTD: Vitamin D3  1,000 Units Oral Daily   Continuous Infusions:   . sodium chloride 50 mL/hr at 11/23/11 0535   PRN Meds:.albuterol, alum & mag hydroxide-simeth, diazepam, guaiFENesin, ondansetron (ZOFRAN) IV, ondansetron, oxyCODONE-acetaminophen  Antibiotics: Anti-infectives     Start     Dose/Rate Route Frequency Ordered Stop   11/22/11 2200  piperacillin-tazobactam (ZOSYN) IVPB 3.375 g       3.375 g 12.5 mL/hr over 240 Minutes Intravenous 3 times per day 11/22/11 1917     11/22/11 2000   vancomycin (VANCOCIN) IVPB 1000 mg/200 mL premix        1,000 mg 200 mL/hr over 60 Minutes Intravenous Every 24 hours 11/22/11 1917     11/22/11 1545   vancomycin (VANCOCIN) IVPB 1000 mg/200 mL premix        1,000 mg 200 mL/hr over 60 Minutes  Intravenous  Once 11/22/11 1542 11/22/11 1800           Jeoffrey Massed, MD  [facility] Pager:[phone]  If 7PM-7AM, please contact night-coverage [email or web address] Password [email or web address] 11/23/2011, 1:12 PM   LOS: 1 day

## 2011-11-23 NOTE — Progress Notes (Signed)
VASCULAR LAB PRELIMINARY  PRELIMINARY  PRELIMINARY  PRELIMINARY  Right lower extremity venous Doppler completed.    Preliminary report:  There is no DVT or SVT noted in the right lower extremities.  Akito Boomhower, 11/23/2011, 10:26 AM

## 2011-11-23 NOTE — Progress Notes (Signed)
Pt was brought up from the ED. Pt was put in her bed. Pt is in no apparent distress. Pt has an elevated bp due to movement. RN will have Tech to reassess. Pt has been oriented to her room and has viewed her safety video.

## 2011-11-23 NOTE — Care Management Note (Addendum)
    Page 1 of 1   11/27/2011     8:26:47 AM   CARE MANAGEMENT NOTE 11/23/2011  Patient:  [name], [name]   Account Number:  [id number]  Date Initiated:  11/23/2011  Documentation initiated by:  Letha Cape  Subjective/Objective Assessment:   dx cellulits  admit- lives with spouse, pta indepandent.     Action/Plan:   Anticipated DC Date:  11/25/2011   Anticipated DC Plan:  HOME/SELF CARE      DC Planning Services  CM consult      Choice offered to / List presented to:             Status of service:  In process, will continue to follow Medicare Important Message given?   (If response is "NO", the following Medicare IM given date fields will be blank) Date Medicare IM given:   Date Additional Medicare IM given:    Discharge Disposition:    Per UR Regulation:  Reviewed for med. necessity/level of care/duration of stay  If discussed at Long Length of Stay Meetings, dates discussed:    Comments:  11/23/11 14:01 Deb Hinton Rao RN, BSN [phone] patient lives with her spouse, pta independent.  Patient has medication coverage and transportation.  NCM will continue to follow for dc needs.

## 2011-11-24 LAB — BASIC METABOLIC PANEL
CO2: 28 mEq/L (ref 19–32)
Calcium: 9.2 mg/dL (ref 8.4–10.5)
Chloride: 108 mEq/L (ref 96–112)
Creatinine, Ser: 0.78 mg/dL (ref 0.50–1.10)
GFR calc Af Amer: >90 mL/min (ref >90)
Sodium: 144 mEq/L (ref 135–145)

## 2011-11-24 LAB — CBC
MCH: 30.8 pg (ref 26.0–34.0)
MCV: 95.6 fL (ref 78.0–100.0)
Platelets: 273 10*3/uL (ref 150–400)
RBC: 3.64 MIL/uL — ABNORMAL LOW (ref 3.87–5.11)
RDW: 15.7 % — ABNORMAL HIGH (ref 11.5–15.5)
WBC: 7.7 10*3/uL (ref 4.0–10.5)

## 2011-11-24 LAB — GLUCOSE, CAPILLARY: Glucose-Capillary: 74 mg/dL (ref 70–99)

## 2011-11-24 MED ORDER — ADALIMUMAB 40 MG/0.8ML ~~LOC~~ KIT: 40.0000 mg | PACK | SUBCUTANEOUS | Status: DC

## 2011-11-24 MED ORDER — DOXYCYCLINE HYCLATE 100 MG PO TABS: 100.0000 mg | ORAL_TABLET | Freq: Two times a day (BID) | ORAL | Status: AC

## 2011-11-24 MED ORDER — DIAZEPAM 5 MG PO TABS: 5.0000 mg | ORAL_TABLET | Freq: Four times a day (QID) | ORAL | Status: DC | PRN

## 2011-11-24 MED ORDER — DOXYCYCLINE HYCLATE 100 MG PO TABS: 100.0000 mg | ORAL_TABLET | Freq: Two times a day (BID) | ORAL | Status: DC

## 2011-11-24 MED ORDER — LEFLUNOMIDE 20 MG PO TABS: 20.0000 mg | ORAL_TABLET | Freq: Every day | ORAL | Status: DC

## 2011-11-24 NOTE — Discharge Summary (Signed)
PATIENT DETAILS Name: [name] Age: 77 y.o. Sex: female Date of Birth: 06/03/1934 MRN: [id number]. Admit Date: 11/22/2011 Admitting Physician: Maretta Bees, MD QMV:HQIO, [name], DO  Recommendations for Outpatient Follow-up:  1. Resume Immunosuppressives -Humira and Leflunomide once off Antibiotics  PRIMARY DISCHARGE DIAGNOSIS:  Principal Problem:  *Cellulitis Active Problems:  HTN (hypertension)  Rheumatoid arthritis  Ulcerative colitis  Spinal stenosis      PAST MEDICAL HISTORY: Past Medical History  Diagnosis Date  . Ulcerative colitis   . Hypothyroidism   . Hypertension     pcp   dr reed   [facility]  . Complication of anesthesia 2009    htn with block  . Rheumatoid arthritis   . Pneumonia 2008  . Chronic bronchitis 10/31/2011    "prone to it; I have sinus/bronchitis problem probably q yr"  . Multiple skin tears 10/31/2011    "get them very easily; my skin is thin and I bruise easily"  . Chronic back pain     "my entire spine"    DISCHARGE MEDICATIONS:   Medication List     As of 11/24/2011  5:44 PM    TAKE these medications         adalimumab 40 MG/0.8ML injection   Commonly known as: HUMIRA   Inject 0.8 mLs (40 mg total) into the skin every 14 (fourteen) days. Resume ONLY if off all antibiotics      aspirin EC 81 MG tablet   Take 81 mg by mouth every other day.      balsalazide 750 MG capsule   Commonly known as: COLAZAL   Take 2,250 mg by mouth 3 (three) times daily.      Calcium + D3 600-200 MG-UNIT Tabs   Take 1 tablet by mouth daily.      diazepam 5 MG tablet   Commonly known as: VALIUM   Take 1 tablet (5 mg total) by mouth every 6 (six) hours as needed. For muscle spasms.      doxycycline 100 MG tablet   Commonly known as: VIBRA-TABS   Take 1 tablet (100 mg total) by mouth 2 (two) times daily.      estrogen (conjugated)-medroxyprogesterone 0.3-1.5 MG per tablet   Commonly known as: PREMPRO   Take 1 tablet by mouth daily.       Fish Oil 1200 MG Caps   Take 1,200 mg by mouth daily.      folic acid 1 MG tablet   Commonly known as: FOLVITE   Take 2 mg by mouth daily.      guaiFENesin 600 MG 12 hr tablet   Commonly known as: MUCINEX   Take 1,200 mg by mouth daily as needed. Sinus relief      hydrochlorothiazide 25 MG tablet   Commonly known as: HYDRODIURIL   Take 25 mg by mouth daily.      latanoprost 0.005 % ophthalmic solution   Commonly known as: XALATAN   Place 1 drop into both eyes at bedtime.      leflunomide 20 MG tablet   Commonly known as: ARAVA   Take 1 tablet (20 mg total) by mouth daily. Resume when off antibiotics and infection resolved      levothyroxine 100 MCG tablet   Commonly known as: SYNTHROID, LEVOTHROID   Take 100 mcg by mouth daily.      multivitamin with minerals Tabs   Take 1 tablet by mouth daily.      oxyCODONE-acetaminophen 5-325 MG per  tablet   Commonly known as: PERCOCET/ROXICET   Take 1 tablet by mouth every 4 (four) hours as needed. For pain      predniSONE 5 MG tablet   Commonly known as: DELTASONE   Take 5 mg by mouth daily. With 2mg  to =7mg       predniSONE 1 MG tablet   Commonly known as: DELTASONE   Take 2 mg by mouth daily. Along with 5mg  to =7mg       Vitamin D3 1000 UNITS Caps   Take 1,000 Units by mouth daily.          BRIEF HPI:  See H&P, Labs, Consult and Test reports for all details in brief, patient was admitted for right leg cellulitis  CONSULTATIONS:   {None PERTINENT RADIOLOGIC STUDIES: Dg Lumbar Spine 2-3 Views  10/31/2011  *RADIOLOGY REPORT*  Clinical Data: Back pain  DG C-ARM 1-60 MIN,LUMBAR SPINE - 2-3 VIEW  Comparison:  MRI 10/02/2011.  Findings: The previous lumbar fusion has been extended upward to include L2-L3. Bilateral pedicle screws have been attached to the previous L3-L5 construct.  IMPRESSION: As above.   Original Report Authenticated By: Elsie Stain, M.D.    Dg C-arm 1-60 Min  10/31/2011  *RADIOLOGY REPORT*   Clinical Data: Back pain  DG C-ARM 1-60 MIN,LUMBAR SPINE - 2-3 VIEW  Comparison:  MRI 10/02/2011.  Findings: The previous lumbar fusion has been extended upward to include L2-L3. Bilateral pedicle screws have been attached to the previous L3-L5 construct.  IMPRESSION: As above.   Original Report Authenticated By: Elsie Stain, M.D.      PERTINENT LAB RESULTS: CBC:  Basename 11/24/11 0521 11/23/11 0550  WBC 7.7 7.3  HGB 11.2* 10.7*  HCT 34.8* 33.5*  PLT 273 271   CMET CMP     Component Value Date/Time   NA 144 11/24/2011 0521   K 3.7 11/24/2011 0521   CL 108 11/24/2011 0521   CO2 28 11/24/2011 0521   GLUCOSE 78 11/24/2011 0521   BUN 14 11/24/2011 0521   CREATININE 0.78 11/24/2011 0521   CALCIUM 9.2 11/24/2011 0521   PROT 6.8 11/22/2011 1237   ALBUMIN 3.9 11/22/2011 1237   AST 29 11/22/2011 1237   ALT 11 11/22/2011 1237   ALKPHOS 132* 11/22/2011 1237   BILITOT 0.6 11/22/2011 1237   GFRNONAA 80* 11/24/2011 0521   GFRAA >90 11/24/2011 0521    GFR Estimated Creatinine Clearance: 60.2 ml/min (by C-G formula based on Cr of 0.78). No results found for this basename: LIPASE:2,AMYLASE:2 in the last 72 hours No results found for this basename: CKTOTAL:3,CKMB:3,CKMBINDEX:3,TROPONINI:3 in the last 72 hours No components found with this basename: POCBNP:3 No results found for this basename: DDIMER:2 in the last 72 hours No results found for this basename: HGBA1C:2 in the last 72 hours No results found for this basename: CHOL:2,HDL:2,LDLCALC:2,TRIG:2,CHOLHDL:2,LDLDIRECT:2 in the last 72 hours No results found for this basename: TSH,T4TOTAL,FREET3,T3FREE,THYROIDAB in the last 72 hours No results found for this basename: VITAMINB12:2,FOLATE:2,FERRITIN:2,TIBC:2,IRON:2,RETICCTPCT:2 in the last 72 hours Coags:  Basename 11/23/11 0550  INR 1.03   Microbiology: Recent Results (from the past 240 hour(s))  CULTURE, BLOOD (ROUTINE X 2)     Status: Normal (Preliminary result)   Collection Time    11/22/11  4:00 PM      Component Value Range Status Comment   Specimen Description BLOOD ARM RIGHT   Final    Special Requests BOTTLES DRAWN AEROBIC AND ANAEROBIC 10CC   Final    Culture  Setup  Time 11/22/2011 22:48   Final    Culture     Final    Value:        BLOOD CULTURE RECEIVED NO GROWTH TO DATE CULTURE WILL BE HELD FOR 5 DAYS BEFORE ISSUING A FINAL NEGATIVE REPORT   Report Status PENDING   Incomplete   CULTURE, BLOOD (ROUTINE X 2)     Status: Normal (Preliminary result)   Collection Time   11/22/11  4:05 PM      Component Value Range Status Comment   Specimen Description BLOOD HAND RIGHT   Final    Special Requests BOTTLES DRAWN AEROBIC ONLY 10CC   Final    Culture  Setup Time 11/22/2011 22:46   Final    Culture     Final    Value:        BLOOD CULTURE RECEIVED NO GROWTH TO DATE CULTURE WILL BE HELD FOR 5 DAYS BEFORE ISSUING A FINAL NEGATIVE REPORT   Report Status PENDING   Incomplete      BRIEF HOSPITAL COURSE:   Principal Problem:  *Cellulitis -given immunocompromised state, patient was admitted and placed on Vanco and Zosyn -Doppler was negative for DVT, blood cultures drawn on 9/11-negative to date till time of discharge. -with 48 hours of IV antibiotics-patient's erythema and swelling significantly improved, please note area was marked on admission. She will now be transitioned to Doxycycline-which she will take for another week. She has been advised to follow up with Dr Renato Gails and Dr Dierdre Forth in the next week.  Active Problems: HTN (hypertension)  -controlled with HCTZ, continue on d/c  Rheumatoid arthritis  -Stable  -c/w prednisone  -spoke with Dr Clydene Laming to hold Leflunomide and Humira till off antibiotics.  Ulcerative colitis  -stable  -on Balsalazide   Spinal stenosis  -stable following surgery  -prn narcotics  TODAY-DAY OF DISCHARGE:  Subjective:   [name] today has no headache,no chest abdominal pain,no new weakness tingling or numbness,  feels much better wants to go home today.   Objective:   Blood pressure 136/72, pulse 66, temperature 98.2 F (36.8 C), temperature source Oral, resp. rate 18, height 5' 7.5" (1.715 m), weight 61.8 kg (136 lb 3.9 oz), SpO2 95.00%.  Intake/Output Summary (Last 24 hours) at 11/24/11 1744 Last data filed at 11/24/11 0900  Gross per 24 hour  Intake    770 ml  Output      0 ml  Net    770 ml    Exam Awake Alert, Oriented *3, No new F.N deficits, Normal affect [place].AT,PERRAL Supple Neck,No JVD, No cervical lymphadenopathy appriciated.  Symmetrical Chest wall movement, Good air movement bilaterally, CTAB RRR,No Gallops,Rubs or new Murmurs, No Parasternal Heave +ve B.Sounds, Abd Soft, Non tender, No organomegaly appriciated, No rebound -guarding or rigidity. No Cyanosis, Clubbing or edema, No new Rash or bruise -Significant decrease in the leg erythema and swelling as compared with admission.  DISCHARGE CONDITION: Stable  DISPOSITION: HOME  DISCHARGE INSTRUCTIONS:    Activity:  As tolerated with Full fall precautions use walker/cane & assistance as needed  Diet recommendation: Heart Healthy diet   Follow-up Information    Follow up with Donnetta Hail, MD. Schedule an appointment as soon as possible for a visit in 5 days.   Contact information:   [address]  [place] [address] [phone]       Follow up with [name], [name], DO. Schedule an appointment as soon as possible for a visit in 1 week.  Contact information:   [address] [place] [address] [phone]            Total Time spent on discharge equals 45 minutes.  SignedJeoffrey Massed 11/24/2011 5:44 PM

## 2011-11-24 NOTE — Progress Notes (Signed)
PATIENT DETAILS Name: [name] Age: 77 y.o. Sex: female Date of Birth: 1934-11-23 Admit Date: 11/22/2011 Admitting Physician Dewayne Shorter Levora Dredge, MD ZOX:[id number] F, MD  Subjective: Right lower extremity significantly better-significant decrease in swelling. Significant decrease in erythema from the marked area  Assessment/Plan: Principal Problem:  *Cellulitis --remains afebrile, No leukocytosis -some decrease in swelling, mild decrease in erythema -c/w empiric Vanco/Zosyn till the end of the day-re-evaluate-if continues to get better-then d/c later today -keep right leg elevated -Dopplers neg for DVT  Active Problems:  HTN (hypertension) -controlled with HCTZ   Rheumatoid arthritis -Stable -c/w prednisone -spoke with Dr Clydene Laming to hold Leflunomide till off antibiotics. Needs to off Humira as well    Ulcerative colitis -stable -on Balsalazide   Spinal stenosis -stable following surgery -prn narcotics  Disposition: Remain inpatient-d/c later today after re-eval  DVT Prophylaxis: Prophylactic Lovenox   Code Status: Full code   Procedures:  None  CONSULTS:  Phone-consult-Dr Dierdre Forth  PHYSICAL EXAM: Vital signs in last 24 hours: Filed Vitals:   11/23/11 1300 11/23/11 2136 11/24/11 0648 11/24/11 1300  BP: 126/48 148/67 144/76 136/72  Pulse: 61 58 63 66  Temp: 98.8 F (37.1 C) 98.4 F (36.9 C) 98.8 F (37.1 C) 98.2 F (36.8 C)  TempSrc: Oral Oral Oral   Resp: 18 18 20 18   Height:      Weight:   61.8 kg (136 lb 3.9 oz)   SpO2: 95% 93% 94% 95%    Weight change: -1.5 kg (-3 lb 4.9 oz) Body mass index is 21.02 kg/(m^2).   Gen Exam: Awake and alert with clear speech.  Neck: Supple, No JVD.   Chest: B/L Clear.   CVS: S1 S2 Regular, no murmurs.  Abdomen: soft, BS +, non tender, non distended.  Extremities: no edema, lower extremities warm to touch.Right lower ext-significant decrease in swelling and erythema-area now  demarcated. Neurologic: Non Focal.   Skin: No Rash.   Wounds: N/A.    Intake/Output from previous day:  Intake/Output Summary (Last 24 hours) at 11/24/11 1602 Last data filed at 11/24/11 0900  Gross per 24 hour  Intake    770 ml  Output      0 ml  Net    770 ml     LAB RESULTS: CBC  Lab 11/24/11 0521 11/23/11 0550 11/22/11 1940 11/22/11 1553 11/22/11 1237  WBC 7.7 7.3 9.4 11.7* 12.9*  HGB 11.2* 10.7* 11.5* 11.7* 12.5  HCT 34.8* 33.5* 35.0* 35.1* 37.6  PLT 273 271 262 286 293  MCV 95.6 95.4 95.6 94.4 95.4  MCH 30.8 30.5 31.4 31.5 31.7  MCHC 32.2 31.9 32.9 33.3 33.2  RDW 15.7* 15.9* 15.7* 15.5 15.7*  LYMPHSABS -- -- -- 1.0 1.7  MONOABS -- -- -- 0.9 0.8  EOSABS -- -- -- 0.0 0.0  BASOSABS -- -- -- 0.0 0.0  BANDABS -- -- -- -- --    Chemistries   Lab 11/24/11 0521 11/23/11 0550 11/22/11 1940 11/22/11 1553 11/22/11 1237  NA 144 142 -- 138 138  K 3.7 3.1* -- 3.5 3.7  CL 108 105 -- 101 101  CO2 28 28 -- 26 26  GLUCOSE 78 85 -- 109* 125*  BUN 14 15 -- 26* 28*  CREATININE 0.78 0.70 0.77 0.75 0.77  CALCIUM 9.2 8.8 -- 9.2 9.7  MG -- -- -- -- --    CBG:  Lab 11/24/11 0743 11/23/11 0745  GLUCAP 74 88    GFR Estimated Creatinine Clearance: 60.2 ml/min (by  C-G formula based on Cr of 0.78).  Coagulation profile  Lab 11/23/11 0550  INR 1.03  PROTIME --    Cardiac Enzymes No results found for this basename: CK:3,CKMB:3,TROPONINI:3,MYOGLOBIN:3 in the last 168 hours  No components found with this basename: POCBNP:3 No results found for this basename: DDIMER:2 in the last 72 hours No results found for this basename: HGBA1C:2 in the last 72 hours No results found for this basename: CHOL:2,HDL:2,LDLCALC:2,TRIG:2,CHOLHDL:2,LDLDIRECT:2 in the last 72 hours No results found for this basename: TSH,T4TOTAL,FREET3,T3FREE,THYROIDAB in the last 72 hours No results found for this basename: VITAMINB12:2,FOLATE:2,FERRITIN:2,TIBC:2,IRON:2,RETICCTPCT:2 in the last 72 hours No  results found for this basename: LIPASE:2,AMYLASE:2 in the last 72 hours  Urine Studies No results found for this basename: UACOL:2,UAPR:2,USPG:2,UPH:2,UTP:2,UGL:2,UKET:2,UBIL:2,UHGB:2,UNIT:2,UROB:2,ULEU:2,UEPI:2,UWBC:2,URBC:2,UBAC:2,CAST:2,CRYS:2,UCOM:2,BILUA:2 in the last 72 hours  MICROBIOLOGY: Recent Results (from the past 240 hour(s))  CULTURE, BLOOD (ROUTINE X 2)     Status: Normal (Preliminary result)   Collection Time   11/22/11  4:00 PM      Component Value Range Status Comment   Specimen Description BLOOD ARM RIGHT   Final    Special Requests BOTTLES DRAWN AEROBIC AND ANAEROBIC 10CC   Final    Culture  Setup Time 11/22/2011 22:48   Final    Culture     Final    Value:        BLOOD CULTURE RECEIVED NO GROWTH TO DATE CULTURE WILL BE HELD FOR 5 DAYS BEFORE ISSUING A FINAL NEGATIVE REPORT   Report Status PENDING   Incomplete   CULTURE, BLOOD (ROUTINE X 2)     Status: Normal (Preliminary result)   Collection Time   11/22/11  4:05 PM      Component Value Range Status Comment   Specimen Description BLOOD HAND RIGHT   Final    Special Requests BOTTLES DRAWN AEROBIC ONLY 10CC   Final    Culture  Setup Time 11/22/2011 22:46   Final    Culture     Final    Value:        BLOOD CULTURE RECEIVED NO GROWTH TO DATE CULTURE WILL BE HELD FOR 5 DAYS BEFORE ISSUING A FINAL NEGATIVE REPORT   Report Status PENDING   Incomplete     RADIOLOGY STUDIES/RESULTS: Dg Lumbar Spine 2-3 Views  10/31/2011  *RADIOLOGY REPORT*  Clinical Data: Back pain  DG C-ARM 1-60 MIN,LUMBAR SPINE - 2-3 VIEW  Comparison:  MRI 10/02/2011.  Findings: The previous lumbar fusion has been extended upward to include L2-L3. Bilateral pedicle screws have been attached to the previous L3-L5 construct.  IMPRESSION: As above.   Original Report Authenticated By: Elsie Stain, M.D.    Dg C-arm 1-60 Min  10/31/2011  *RADIOLOGY REPORT*  Clinical Data: Back pain  DG C-ARM 1-60 MIN,LUMBAR SPINE - 2-3 VIEW  Comparison:  MRI 10/02/2011.   Findings: The previous lumbar fusion has been extended upward to include L2-L3. Bilateral pedicle screws have been attached to the previous L3-L5 construct.  IMPRESSION: As above.   Original Report Authenticated By: Elsie Stain, M.D.     MEDICATIONS: Scheduled Meds:    . aspirin EC  81 mg Oral QODAY  . balsalazide  2,250 mg Oral TID  . calcium-vitamin D  1 tablet Oral Daily  . cholecalciferol  1,000 Units Oral Daily  . enoxaparin (LOVENOX) injection  40 mg Subcutaneous Q24H  . estrogens (conjugated)  0.3 mg Oral Daily   And  . medroxyPROGESTERone  1.25 mg Oral Daily  . folic acid  2  mg Oral Daily  . hydrochlorothiazide  25 mg Oral Daily  . latanoprost  1 drop Both Eyes QHS  . levothyroxine  100 mcg Oral QAC breakfast  . multivitamin with minerals  1 tablet Oral Daily  . omega-3 acid ethyl esters  1 g Oral Daily  . piperacillin-tazobactam (ZOSYN)  IV  3.375 g Intravenous Q8H  . predniSONE  7 mg Oral Q breakfast  . senna  1 tablet Oral BID  . vancomycin  1,000 mg Intravenous Q24H  . DISCONTD: leflunomide  20 mg Oral Daily   Continuous Infusions:  PRN Meds:.albuterol, alum & mag hydroxide-simeth, diazepam, guaiFENesin, ondansetron (ZOFRAN) IV, ondansetron, oxyCODONE-acetaminophen  Antibiotics: Anti-infectives     Start     Dose/Rate Route Frequency Ordered Stop   11/22/11 2200   piperacillin-tazobactam (ZOSYN) IVPB 3.375 g        3.375 g 12.5 mL/hr over 240 Minutes Intravenous 3 times per day 11/22/11 1917     11/22/11 2000   vancomycin (VANCOCIN) IVPB 1000 mg/200 mL premix        1,000 mg 200 mL/hr over 60 Minutes Intravenous Every 24 hours 11/22/11 1917     11/22/11 1545   vancomycin (VANCOCIN) IVPB 1000 mg/200 mL premix        1,000 mg 200 mL/hr over 60 Minutes Intravenous  Once 11/22/11 1542 11/22/11 1800           Jeoffrey Massed, MD  [facility] Pager:[phone]  If 7PM-7AM, please contact night-coverage [email or web address] Password  [email or web address] 11/24/2011, 4:02 PM   LOS: 2 days

## 2011-11-28 LAB — CULTURE, BLOOD (ROUTINE X 2)

## 2012-07-29 ENCOUNTER — Other Ambulatory Visit: Payer: Medicare Other

## 2012-07-29 ENCOUNTER — Other Ambulatory Visit: Payer: Self-pay | Admitting: *Deleted

## 2012-07-29 DIAGNOSIS — I10 Essential (primary) hypertension: Secondary | ICD-10-CM

## 2012-07-29 DIAGNOSIS — E039 Hypothyroidism, unspecified: Secondary | ICD-10-CM

## 2012-07-29 DIAGNOSIS — E785 Hyperlipidemia, unspecified: Secondary | ICD-10-CM

## 2012-07-30 ENCOUNTER — Encounter: Payer: Self-pay | Admitting: *Deleted

## 2012-07-30 LAB — CBC WITH DIFFERENTIAL/PLATELET
Basophils Absolute: 0.1 10*3/uL (ref 0.0–0.2)
Basos: 1 % (ref 0–3)
Eos: 5 % (ref 0–5)
Eosinophils Absolute: 0.3 10*3/uL (ref 0.0–0.4)
HCT: 44.5 % (ref 34.0–46.6)
Hemoglobin: 14.5 g/dL (ref 11.1–15.9)
Immature Grans (Abs): 0 10*3/uL (ref 0.0–0.1)
Immature Granulocytes: 0 % (ref 0–2)
Lymphocytes Absolute: 1.8 10*3/uL (ref 0.7–3.1)
Lymphs: 35 % (ref 14–46)
MCH: 30.3 pg (ref 26.6–33.0)
MCHC: 32.6 g/dL (ref 31.5–35.7)
MCV: 93 fL (ref 79–97)
Monocytes Absolute: 0.6 10*3/uL (ref 0.1–0.9)
Monocytes: 12 % (ref 4–12)
Neutrophils Absolute: 2.4 10*3/uL (ref 1.4–7.0)
Neutrophils Relative %: 47 % (ref 40–74)
RBC: 4.78 x10E6/uL (ref 3.77–5.28)
RDW: 14.3 % (ref 12.3–15.4)
WBC: 5.1 10*3/uL (ref 3.4–10.8)

## 2012-07-30 LAB — TSH: TSH: 1.03 u[IU]/mL (ref 0.450–4.500)

## 2012-08-01 ENCOUNTER — Encounter: Payer: Self-pay | Admitting: Internal Medicine

## 2012-08-01 ENCOUNTER — Ambulatory Visit (INDEPENDENT_AMBULATORY_CARE_PROVIDER_SITE_OTHER): Payer: Medicare Other | Admitting: Internal Medicine

## 2012-08-01 VITALS — BP 124/72 | HR 61 | Temp 98.1°F | Resp 14 | Ht 67.5 in | Wt 138.8 lb

## 2012-08-01 DIAGNOSIS — R5383 Other fatigue: Secondary | ICD-10-CM | POA: Insufficient documentation

## 2012-08-01 DIAGNOSIS — M4800 Spinal stenosis, site unspecified: Secondary | ICD-10-CM

## 2012-08-01 DIAGNOSIS — M069 Rheumatoid arthritis, unspecified: Secondary | ICD-10-CM

## 2012-08-01 DIAGNOSIS — R5381 Other malaise: Secondary | ICD-10-CM

## 2012-08-01 DIAGNOSIS — I10 Essential (primary) hypertension: Secondary | ICD-10-CM

## 2012-08-01 DIAGNOSIS — E039 Hypothyroidism, unspecified: Secondary | ICD-10-CM | POA: Insufficient documentation

## 2012-08-01 DIAGNOSIS — K5190 Ulcerative colitis, unspecified, without complications: Secondary | ICD-10-CM

## 2012-08-01 MED ORDER — LEVOTHYROXINE SODIUM 88 MCG PO TABS: 88.0000 ug | ORAL_TABLET | Freq: Every day | ORAL | Status: DC

## 2012-08-01 NOTE — Assessment & Plan Note (Signed)
Etiology not clear.  Humira actually does not have this as a side effect.  She is no longer anemic, in fact, h/h slightly elevated.  Continues to smoke, but no clear problems from this.  TSH was normal.

## 2012-08-01 NOTE — Assessment & Plan Note (Signed)
Brought copies of her labs from her rheumatologist along today.  She is on humira, lefluomide, celebrex and norco.  She doesn't take much of her norco due to side effects.  Her pain has improved since the weather has warmed up.  She has not had any recent illnesses despite immunocompromised state.

## 2012-08-01 NOTE — Assessment & Plan Note (Signed)
Stable.  Had surgery last year.  Has done well.

## 2012-08-01 NOTE — Assessment & Plan Note (Signed)
Pt on .  Continued this today.  Explained some of her fatigue may be aging related, but we will continue to monitor.

## 2012-08-01 NOTE — Patient Instructions (Signed)
Return to increased activity for energy.

## 2012-08-01 NOTE — Assessment & Plan Note (Signed)
Has been stable.  Is aware of foods to avoid, and knows what to expect if she eats them.

## 2012-08-01 NOTE — Assessment & Plan Note (Signed)
BP at goal with hctz alone.  Also take her baby asa.

## 2012-08-01 NOTE — Progress Notes (Signed)
Patient ID: [name], female   DOB: 06-07-1934, 78 y.o.   MRN: [id number] Code Status: full code  No Known Allergies  Chief Complaint  Patient presents with  . Medical Managment of Chronic Issues    HPI: Patient is a 78 y.o. white female  seen in the office today for routine visit to medically manage her chronic diseases.  Winter was terrible with her arthritis with dampness.    Is incredibly fatigued--moreso than her norm.  Typically has taken a 30 min nap in the past, but now not relieving her fatigue.  Also has dry skin, is cold all of the time.  Wants to go back to synthroid rather than levothyroxine.    Thinks humira since February has been very helpful.  Surgical site (right hip) much improved.  Saw Dr. Junie Bame re: left elbow--all bone on bone.  Could remove radial head or completely replace the joint.    Asks about taking dramamine for being on a boat.  Is going on cruise in [place] and [place].    Review of Systems:  Review of Systems  Constitutional: Positive for malaise/fatigue. Negative for fever, chills and weight loss.  Eyes: Negative for blurred vision.  Respiratory: Negative for shortness of breath.   Cardiovascular: Negative for chest pain.  Gastrointestinal:       Ulcerative colitis--no recent flares--knows which foods affect her bowels  Genitourinary: Negative for dysuria.  Musculoskeletal: Positive for back pain and joint pain.  Skin: Negative for rash.  Neurological: Negative for dizziness, focal weakness, weakness and headaches.  Endo/Heme/Allergies: Negative for polydipsia.  Psychiatric/Behavioral: Negative for depression and memory loss. The patient is not nervous/anxious and does not have insomnia.      Past Medical History  Diagnosis Date  . Ulcerative colitis   . Hypothyroidism   . Hypertension     pcp   dr Daelyn Mozer   [facility]  . Complication of anesthesia 2009    htn with block  . Rheumatoid arthritis   . Pneumonia 2008   . Chronic bronchitis 10/31/2011    "prone to it; I have sinus/bronchitis problem probably q yr"  . Multiple skin tears 10/31/2011    "get them very easily; my skin is thin and I bruise easily"  . Chronic back pain     "my entire spine"  . Edema   . Other and unspecified hyperlipidemia   . Encounter for long-term (current) use of other medications   . Senile osteoporosis   . Spinal stenosis, unspecified region other than cervical   . Unspecified glaucoma(365.9)   . Hypothyroidism    Past Surgical History  Procedure Laterality Date  . Back surgery    . Sinus surgery with instatrak    . Thyroidectomy, partial  1997  . Posterior fusion lumbar spine  10/31/2011    L2-3; 3-4  . Tonsillectomy      "when I was a small child"  . Functional endoscopic sinus surgery  1988  . Posterior fusion lumbar spine  11/2009    L3-4;  L4-5  . Joint replacement  2009; 2006    joints 2 fingers,rt hand; joint left thumb   Social History:   reports that she has been passively smoking Cigarettes.  She has a 12.5 pack-year smoking history. She has never used smokeless tobacco. She reports that she drinks about 2.4 ounces of alcohol per week. She reports that she does not use illicit drugs.  Family History  Problem Relation Age of Onset  .  Heart disease Mother   . Cancer Father     lung    Medications: Patient's Medications  New Prescriptions   LEVOTHYROXINE (SYNTHROID) 88 MCG TABLET    Take 1 tablet (88 mcg total) by mouth daily before breakfast.  Previous Medications   ADALIMUMAB (HUMIRA) 40 MG/0.8ML INJECTION    Inject 0.8 mLs (40 mg total) into the skin every 14 (fourteen) days. Resume ONLY if off all antibiotics   ASPIRIN EC 81 MG TABLET    Take 81 mg by mouth every other day.   BALSALAZIDE (COLAZAL) 750 MG CAPSULE    Take 2,250 mg by mouth 3 (three) times daily.   CALCIUM CARB-CHOLECALCIFEROL (CALCIUM + D3) 600-200 MG-UNIT TABS    Take 1 tablet by mouth daily.   CELEBREX 200 MG CAPSULE    Take  two tablets once daily for pains   CHOLECALCIFEROL (VITAMIN D3) 1000 UNITS CAPS    Take 1,000 Units by mouth daily.   DIAZEPAM (VALIUM) 5 MG TABLET    Take 1 tablet (5 mg total) by mouth every 6 (six) hours as needed. For muscle spasms.   ESTRADIOL (ESTRACE) 0.5 MG TABLET    Take one tablet once daily for hormone   ESTROGEN, CONJUGATED,-MEDROXYPROGESTERONE (PREMPRO) 0.3-1.5 MG PER TABLET    Take 1 tablet by mouth daily.   FOLIC ACID (FOLVITE) 1 MG TABLET    Take 2 mg by mouth daily.   GUAIFENESIN (MUCINEX) 600 MG 12 HR TABLET    Take 1,200 mg by mouth daily as needed. Sinus relief   HYDROCHLOROTHIAZIDE (HYDRODIURIL) 25 MG TABLET    Take 25 mg by mouth daily.   HYDROCODONE-ACETAMINOPHEN (NORCO/VICODIN) 5-325 MG PER TABLET    Take one tablet every 4-6 hours as needed for pain   LATANOPROST (XALATAN) 0.005 % OPHTHALMIC SOLUTION    Place 1 drop into both eyes at bedtime.   LEFLUNOMIDE (ARAVA) 20 MG TABLET    Take 1 tablet (20 mg total) by mouth daily. Resume when off antibiotics and infection resolved   MULTIPLE VITAMIN (MULTIVITAMIN WITH MINERALS) TABS    Take 1 tablet by mouth daily.   OMEGA-3 FATTY ACIDS (FISH OIL) 1200 MG CAPS    Take 1,200 mg by mouth daily.   PREDNISONE (DELTASONE) 5 MG TABLET    Take 5 mg by mouth daily. With 2mg  to =7mg   Modified Medications   No medications on file  Discontinued Medications   LEVOTHYROXINE (SYNTHROID, LEVOTHROID) 100 MCG TABLET    Take 100 mcg by mouth daily.   MEDROXYPROGESTERONE (PROVERA) 2.5 MG TABLET       OXYCODONE-ACETAMINOPHEN (PERCOCET/ROXICET) 5-325 MG PER TABLET    Take 1 tablet by mouth every 4 (four) hours as needed. For pain   PREDNISONE (DELTASONE) 1 MG TABLET    Take 2 mg by mouth daily. Along with 5mg  to =7mg    PREDNISONE (DELTASONE) 5 MG TABLET         Physical Exam:  Filed Vitals:   08/01/12 1006  BP: 124/72  Pulse: 61  Temp: 98.1 F (36.7 C)  TempSrc: Oral  Resp: 14  Height: 5' 7.5" (1.715 m)  Weight: 138 lb 12.8 oz (62.959  kg)   Physical Exam  Constitutional: She is oriented to person, place, and time. She appears well-developed and well-nourished. No distress.  HENT:  Head: Normocephalic and atraumatic.  Eyes: Pupils are equal, round, and reactive to light.  Cardiovascular: Normal rate, regular rhythm, normal heart sounds and intact distal pulses.  Exam reveals  no gallop and no friction rub.   No murmur heard. Pulmonary/Chest: Effort normal and breath sounds normal. No respiratory distress.  Abdominal: Soft. Bowel sounds are normal.  Musculoskeletal: She exhibits no edema.  Walks with limp since hip surgery;  Has severe deformity of her PIP and DIP joints b/l  Neurological: She is alert and oriented to person, place, and time. No cranial nerve deficit.  Skin: Skin is warm and dry. She is not diaphoretic.  Psychiatric: She has a normal mood and affect. Her behavior is normal. Judgment and thought content normal.      Labs reviewed: 05/07/2009 BMP: glucose 86, BUN 23, Creatinine 0.92, Potassium 5.3 06/24/2009 CBC normal CMP: glucose 79, BUN 24, Creatinine 0.78, Protein 5.9 Lipid: cholesterol 225, triglycerides 84, HDL 97, LDL 111 TSH 0.693 03/18/1094 T3 35 T4 7.8 TSH 0.367 08/08/11 Lipid Panel; Cholesterol 243, Triglycerides 102, HDL 99, LDL 124 01/29/2012  ([facility]) CBC: wbc 6.4, rbc 4.59, hemoglobin 14.2 CMP: glucose 83, BUN 28, Creatinine 0.9, AST 42 01/30/2012  Lipid: Cholesterol 253, Triglycerides 45, HDL 106, LDL 138 TSH: 0.236 Vitamin D: 33.2  Basic Metabolic Panel:  Recent Labs  04/54/09 1553 11/22/11 1940 11/23/11 0550 11/24/11 0521 07/29/12 0921  NA 138  --  142 144  --   K 3.5  --  3.1* 3.7  --   CL 101  --  105 108  --   CO2 26  --  28 28  --   GLUCOSE 109*  --  85 78  --   BUN 26*  --  15 14  --   CREATININE 0.75 0.77 0.70 0.78  --   CALCIUM 9.2  --  8.8 9.2  --   TSH  --   --   --   --  1.030   Liver Function Tests:  Recent Labs  11/22/11 1237  AST 29  ALT 11  ALKPHOS  132*  BILITOT 0.6  PROT 6.8  ALBUMIN 3.9   CBC:  Recent Labs  11/22/11 1940 11/23/11 0550 11/24/11 0521 07/29/12 0921  WBC 9.4 7.3 7.7 5.1  NEUTROABS  --   --   --  2.4  HGB 11.5* 10.7* 11.2* 14.5  HCT 35.0* 33.5* 34.8* 44.5  MCV 95.6 95.4 95.6 93  PLT 262 271 273  --    Past Procedures: 1982,broken leg  2006,mammogram and pap smear  Dr.Wren 08/28/2006 Chest x-ray near complete resolution of extensive parenchymal opacification in the right middle lobe. Clearing of the right basilar infiltrate. 09/19/2006 Chest x-ray Linear opacity persists in the region of the middle lobe likely representing a focus of chronic subsegmental atelectasis. No significant change noted compared to the study of 08/28/2006 Assessment/Plan HTN (hypertension) BP at goal with hctz alone.  Also take her baby asa.  Rheumatoid arthritis Brought copies of her labs from her rheumatologist along today.  She is on humira, lefluomide, celebrex and norco.  She doesn't take much of her norco due to side effects.  Her pain has improved since the weather has warmed up.  She has not had any recent illnesses despite immunocompromised state.    Spinal stenosis Stable.  Had surgery last year.  Has done well.  Ulcerative colitis Has been stable.  Is aware of foods to avoid, and knows what to expect if she eats them.    Fatigue Etiology not clear.  Humira actually does not have this as a side effect.  She is no longer anemic, in fact, h/h slightly elevated.  Continues to smoke, but no clear problems from this.  TSH was normal.    Hypothyroidism Pt on .  Continued this today.  Explained some of her fatigue may be aging related, but we will continue to monitor.   Labs/tests ordered:  Will order routine labs at next appt.

## 2012-09-26 ENCOUNTER — Other Ambulatory Visit: Payer: Self-pay | Admitting: Obstetrics & Gynecology

## 2012-09-26 DIAGNOSIS — R928 Other abnormal and inconclusive findings on diagnostic imaging of breast: Secondary | ICD-10-CM

## 2012-09-26 LAB — HM MAMMOGRAPHY: HM Mammogram: NORMAL

## 2012-10-09 ENCOUNTER — Ambulatory Visit
Admission: RE | Admit: 2012-10-09 | Discharge: 2012-10-09 | Disposition: A | Discharge status: Home / Self Care | Payer: Medicare Other | Source: Ambulatory Visit | Attending: Obstetrics & Gynecology | Admitting: Obstetrics & Gynecology

## 2012-10-09 DIAGNOSIS — R928 Other abnormal and inconclusive findings on diagnostic imaging of breast: Secondary | ICD-10-CM

## 2012-11-08 ENCOUNTER — Other Ambulatory Visit: Payer: Self-pay | Admitting: Neurological Surgery

## 2012-11-08 DIAGNOSIS — M48061 Spinal stenosis, lumbar region without neurogenic claudication: Secondary | ICD-10-CM

## 2012-11-19 ENCOUNTER — Ambulatory Visit
Admission: RE | Admit: 2012-11-19 | Discharge: 2012-11-19 | Disposition: A | Discharge status: Home / Self Care | Payer: 59 | Source: Ambulatory Visit | Attending: Neurological Surgery | Admitting: Neurological Surgery

## 2012-11-19 DIAGNOSIS — M48061 Spinal stenosis, lumbar region without neurogenic claudication: Secondary | ICD-10-CM

## 2012-11-19 MED ORDER — GADOBENATE DIMEGLUMINE 529 MG/ML IV SOLN
10.0000 mL | Freq: Once | INTRAVENOUS | Status: AC | PRN
  Administered 2012-11-19: 10 mL via INTRAVENOUS

## 2013-01-30 ENCOUNTER — Ambulatory Visit: Payer: Medicare Other | Admitting: Internal Medicine

## 2013-02-14 ENCOUNTER — Ambulatory Visit: Payer: Medicare Other | Admitting: Internal Medicine

## 2013-03-10 ENCOUNTER — Ambulatory Visit (INDEPENDENT_AMBULATORY_CARE_PROVIDER_SITE_OTHER): Payer: Medicare Other | Admitting: Internal Medicine

## 2013-03-10 ENCOUNTER — Encounter (INDEPENDENT_AMBULATORY_CARE_PROVIDER_SITE_OTHER): Payer: Self-pay

## 2013-03-10 ENCOUNTER — Encounter: Payer: Self-pay | Admitting: Internal Medicine

## 2013-03-10 VITALS — BP 136/78 | HR 58 | Temp 97.9°F | Wt 142.2 lb

## 2013-03-10 DIAGNOSIS — M4800 Spinal stenosis, site unspecified: Secondary | ICD-10-CM

## 2013-03-10 DIAGNOSIS — E785 Hyperlipidemia, unspecified: Secondary | ICD-10-CM

## 2013-03-10 DIAGNOSIS — K5190 Ulcerative colitis, unspecified, without complications: Secondary | ICD-10-CM

## 2013-03-10 DIAGNOSIS — E039 Hypothyroidism, unspecified: Secondary | ICD-10-CM

## 2013-03-10 DIAGNOSIS — G8929 Other chronic pain: Secondary | ICD-10-CM

## 2013-03-10 DIAGNOSIS — M069 Rheumatoid arthritis, unspecified: Secondary | ICD-10-CM

## 2013-03-10 DIAGNOSIS — G4701 Insomnia due to medical condition: Secondary | ICD-10-CM

## 2013-03-10 NOTE — Progress Notes (Signed)
Patient ID: [name], female   DOB: 12-19-1934, 78 y.o.   MRN: [id number]   Location:  [facility]  No Known Allergies  Chief Complaint  Patient presents with  . Medical Managment of Chronic Issues    6 month f/u  . Immunizations    will print Zostavax  . other    Colonoscopy 10 yrs ago, sees Dr Andrey Campanile    HPI: Patient is a 78 y.o. white female seen in the office today for 6 month f/u.   Had 2 more epidural shots with Dr. Danielle Dess. Is getting PT which is painful, ut helping some.   Has vertebrae pressing on nerves in her lower back and buttocks and across both sides of her back.   Not much tingling in her legs.  Probably needs knee surgery, too.  Having severe difficulty with stairs in her house.  Is getting around with difficulty.  Got knee injection.   Takes pain meds (hydrocodone as little as possible--typically once a day).  Has great difficulty getting comfortable to sleep.  Only comfortable flat on her back.  Had to get up at 6am and take pain pill.  Discussed that pain pill is better than a sleeping pill if pain is what is keeping her up.   Discussed prevnar. 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 Admits it is hard to be positive when pain doesn't stop, but does not feel clinically depressed. Did not go to [facility] due to drive.    Review of Systems:  Review of Systems  Constitutional: Positive for malaise/fatigue. Negative for fever and chills.  HENT: Negative for congestion.   Eyes: Negative for blurred vision.  Respiratory: Positive for cough. Negative for shortness of breath.   Cardiovascular: Negative for chest pain and palpitations.  Gastrointestinal: Negative for heartburn, abdominal pain, constipation, blood in stool and melena.  Genitourinary: Negative for dysuria.  Musculoskeletal: Positive for back pain and joint pain. Negative for falls and myalgias.  Skin: Negative for rash.    Neurological: Negative for dizziness, weakness and headaches.  Psychiatric/Behavioral: Negative for depression and memory loss.    Past Medical History  Diagnosis Date  . Ulcerative colitis   . Hypothyroidism   . Hypertension     pcp   dr Shonnie Poudrier   [facility]  . Complication of anesthesia 2009    htn with block  . Rheumatoid arthritis(714.0)   . Pneumonia 2008  . Chronic bronchitis 10/31/2011    "prone to it; I have sinus/bronchitis problem probably q yr"  . Multiple skin tears 10/31/2011    "get them very easily; my skin is thin and I bruise easily"  . Chronic back pain     "my entire spine"  . Edema   . Other and unspecified hyperlipidemia   . Encounter for long-term (current) use of other medications   . Senile osteoporosis   . Spinal stenosis, unspecified region other than cervical   . Unspecified glaucoma(365.9)   . Hypothyroidism     Past Surgical History  Procedure Laterality Date  . Back surgery    . Sinus surgery with instatrak    . Thyroidectomy, partial  1997  . Posterior fusion lumbar spine  10/31/2011    L2-3; 3-4  . Tonsillectomy      "when I was a small child"  . Functional endoscopic sinus surgery  1988  . Posterior fusion lumbar spine  11/2009  L3-4;  L4-5  . Joint replacement  2009; 2006    joints 2 fingers,rt hand; joint left thumb    Social History:   reports that she has been passively smoking Cigarettes.  She has a 12.5 pack-year smoking history. She has never used smokeless tobacco. She reports that she drinks about 2.4 ounces of alcohol per week. She reports that she does not use illicit drugs.  Family History  Problem Relation Age of Onset  . Heart disease Mother   . Cancer Father     lung    Medications: Patient's Medications  New Prescriptions   No medications on file  Previous Medications   ADALIMUMAB (HUMIRA) 40 MG/0.8ML INJECTION    Inject 0.8 mLs (40 mg total) into the skin every 14 (fourteen) days. Resume ONLY if off all  antibiotics   ASPIRIN EC 81 MG TABLET    Take 81 mg by mouth every other day.   BALSALAZIDE (COLAZAL) 750 MG CAPSULE    Take 2,250 mg by mouth 3 (three) times daily.   CALCIUM CARB-CHOLECALCIFEROL (CALCIUM + D3) 600-200 MG-UNIT TABS    Take 1 tablet by mouth daily.   CELEBREX 200 MG CAPSULE    Take two tablets once daily for pains   CHOLECALCIFEROL (VITAMIN D3) 1000 UNITS CAPS    Take 1,000 Units by mouth daily.   DIAZEPAM (VALIUM) 5 MG TABLET    Take 1 tablet (5 mg total) by mouth every 6 (six) hours as needed. For muscle spasms.   ESTRADIOL (ESTRACE) 0.5 MG TABLET    Take one tablet once daily for hormone   ESTROGEN, CONJUGATED,-MEDROXYPROGESTERONE (PREMPRO) 0.3-1.5 MG PER TABLET    Take 1 tablet by mouth daily.   FOLIC ACID (FOLVITE) 1 MG TABLET    Take 2 mg by mouth daily.   GUAIFENESIN (MUCINEX) 600 MG 12 HR TABLET    Take 1,200 mg by mouth daily as needed. Sinus relief   HYDROCHLOROTHIAZIDE (HYDRODIURIL) 25 MG TABLET    Take 25 mg by mouth daily.   HYDROCODONE-ACETAMINOPHEN (NORCO/VICODIN) 5-325 MG PER TABLET    Take one tablet every 4-6 hours as needed for pain   LATANOPROST (XALATAN) 0.005 % OPHTHALMIC SOLUTION    Place 1 drop into both eyes at bedtime.   LEFLUNOMIDE (ARAVA) 20 MG TABLET    Take 1 tablet (20 mg total) by mouth daily. Resume when off antibiotics and infection resolved   LEVOTHYROXINE (SYNTHROID) 88 MCG TABLET    Take 1 tablet (88 mcg total) by mouth daily before breakfast.   MULTIPLE VITAMIN (MULTIVITAMIN WITH MINERALS) TABS    Take 1 tablet by mouth daily.   OMEGA-3 FATTY ACIDS (FISH OIL) 1200 MG CAPS    Take 1,200 mg by mouth daily.   PREDNISONE (DELTASONE) 5 MG TABLET    Take 5 mg by mouth daily. With 2mg  to =7mg   Modified Medications   No medications on file  Discontinued Medications   No medications on file     Physical Exam: Filed Vitals:   03/10/13 1128  BP: 136/78  Pulse: 58  Temp: 97.9 F (36.6 C)  TempSrc: Oral  Weight: 142 lb 3.2 oz (64.501 kg)    SpO2: 96%  Physical Exam  Constitutional: She is oriented to person, place, and time. She appears well-developed and well-nourished. No distress.  HENT:  Head: Normocephalic and atraumatic.  Cardiovascular: Normal rate, regular rhythm, normal heart sounds and intact distal pulses.   Pulmonary/Chest: Effort normal and breath sounds normal. No respiratory  distress.  Abdominal: Soft. Bowel sounds are normal. She exhibits no distension. There is no tenderness.  Musculoskeletal: Normal range of motion. She exhibits tenderness.  Deformities of fingers, walks with limp  Neurological: She is alert and oriented to person, place, and time.  Skin: Skin is warm and dry.    Labs reviewed: Basic Metabolic Panel:  Recent Labs  16/10/96 0921  TSH 1.030  CBC:  Recent Labs  07/29/12 0921  WBC 5.1  NEUTROABS 2.4  HGB 14.5  HCT 44.5  MCV 93   Assessment/Plan 1. Hypothyroidism -is increasingly fatigued--could be due to her biologic agents -will check tsh to make sure she remains stable with this--uses brand name synthroid - TSH; Future  2. Rheumatoid arthritis(714.0) -continues on humira with benefit, but joints are gradually declining though she did well for about 10 years after her dx  3. Hyperlipidemia LDL goal < 100 -continue healthy diet -no longer able to exercise and travel like she used to secondary to pain - Lipid panel; Future  4. Ulcerative colitis -also improved with use of humira, no recent flare-ups  5. Spinal stenosis -back pain getting worse -she feels her last surgery was not a success -she has difficulty getting comfortable to sleep at night--advised to take hydrocodone at bedtime  6. Insomnia secondary to chronic pain -use hydrocodone at bedtime for pain and sleep  Labs/tests ordered: Orders Placed This Encounter  Procedures  . HM MAMMOGRAPHY    This external order was created through the Results Console.  Marland Kitchen HM DEXA SCAN    This external order was  created through the Results Console.  . TSH    Standing Status: Future     Number of Occurrences:      Standing Expiration Date: 06/08/2013  . Lipid panel    Standing Status: Future     Number of Occurrences:      Standing Expiration Date: 06/08/2013    Order Specific Question:  Has the patient fasted?    Answer:  Yes  . HM COLONOSCOPY    This external order was created through the Results Console.   Next appt: 6 mos

## 2013-03-10 NOTE — Patient Instructions (Signed)
Check with your insurance about coverage of prevnar for you.  You may come for a medical assistant visit for the vaccine if it is covered.

## 2013-03-14 ENCOUNTER — Other Ambulatory Visit: Payer: Medicare Other

## 2013-03-14 DIAGNOSIS — E039 Hypothyroidism, unspecified: Secondary | ICD-10-CM

## 2013-03-14 DIAGNOSIS — E785 Hyperlipidemia, unspecified: Secondary | ICD-10-CM

## 2013-03-15 LAB — LIPID PANEL
Chol/HDL Ratio: 2.7 ratio units (ref 0.0–4.4)
Cholesterol, Total: 281 mg/dL — ABNORMAL HIGH (ref 100–199)
HDL: 105 mg/dL (ref >39)
LDL Calculated: 154 mg/dL — ABNORMAL HIGH (ref 0–99)
Triglycerides: 110 mg/dL (ref 0–149)
VLDL Cholesterol Cal: 22 mg/dL (ref 5–40)

## 2013-03-15 LAB — TSH: TSH: 1.06 u[IU]/mL (ref 0.450–4.500)

## 2013-03-19 ENCOUNTER — Encounter: Payer: Self-pay | Admitting: Internal Medicine

## 2013-05-21 ENCOUNTER — Ambulatory Visit (INDEPENDENT_AMBULATORY_CARE_PROVIDER_SITE_OTHER): Payer: Medicare Other | Admitting: Nurse Practitioner

## 2013-05-21 ENCOUNTER — Encounter: Payer: Self-pay | Admitting: Nurse Practitioner

## 2013-05-21 VITALS — BP 160/92 | HR 66 | Resp 10 | Wt 139.0 lb

## 2013-05-21 DIAGNOSIS — R609 Edema, unspecified: Secondary | ICD-10-CM

## 2013-05-21 DIAGNOSIS — K5900 Constipation, unspecified: Secondary | ICD-10-CM

## 2013-05-21 DIAGNOSIS — M62838 Other muscle spasm: Secondary | ICD-10-CM

## 2013-05-21 MED ORDER — DIAZEPAM 5 MG PO TABS: 5.0000 mg | ORAL_TABLET | Freq: Four times a day (QID) | ORAL | Status: DC | PRN

## 2013-05-21 NOTE — Patient Instructions (Signed)
1. LEG SWELLING -elevate legs above level of heart when sitting -avoid food high in sodium and salt -TED hose- to apply in morning and take off in the evening  2. Unspecified constipation -colace 1-2 times daily -increase water intake -bene-fiber into 8-10 oz fluid of choice daily  3. Muscle spasm -muscle rubs (biofreeze or ben gay) as needed  - diazepam (VALIUM) 5 MG tablet; Take 1 tablet (5 mg total) by mouth every 6 (six) hours as needed. For muscle spasms.  Dispense: 15 tablet; Refill: 0

## 2013-05-21 NOTE — Progress Notes (Signed)
Patient ID: [name], female   DOB: 07-11-1934, 79 y.o.   MRN: [id number]    No Known Allergies  Chief Complaint  Patient presents with  . Edema    Bilateral ankle swelling (left ankle is worse) x 3 weeks, this is a common concern for patient  . Spasms    Patient c/o muscle spasms x 2 weeks     HPI: Patient is a 79 y.o. female seen in the office today for increased fluid retention in ankles, has had this in the past but wanted to have it checked out; pitting in the ankles; does her best to avoid salt, tries to elevate legs at night Has been going to PT but due to muscle spasm she is limited.-- going back to spine doctor  Not having spasm all the time, just occasionally Review of Systems:  Review of Systems  Constitutional: Negative for fever, chills and malaise/fatigue.  Respiratory: Negative for cough, sputum production, shortness of breath and wheezing.   Cardiovascular: Positive for leg swelling. Negative for chest pain and palpitations.  Gastrointestinal: Positive for constipation.  Musculoskeletal: Positive for back pain and myalgias (and muscle spasms). Negative for falls.  Skin: Negative.  Negative for itching and rash.  Neurological: Negative for dizziness, weakness and headaches.     Past Medical History  Diagnosis Date  . Ulcerative colitis   . Hypothyroidism   . Hypertension     pcp   dr reed   [facility]  . Complication of anesthesia 2009    htn with block  . Rheumatoid arthritis(714.0)   . Pneumonia 2008  . Chronic bronchitis 10/31/2011    "prone to it; I have sinus/bronchitis problem probably q yr"  . Multiple skin tears 10/31/2011    "get them very easily; my skin is thin and I bruise easily"  . Chronic back pain     "my entire spine"  . Edema   . Other and unspecified hyperlipidemia   . Encounter for long-term (current) use of other medications   . Senile osteoporosis   . Spinal stenosis, unspecified region other than cervical   . Unspecified  glaucoma   . Hypothyroidism    Past Surgical History  Procedure Laterality Date  . Back surgery    . Sinus surgery with instatrak    . Thyroidectomy, partial  1997  . Posterior fusion lumbar spine  10/31/2011    L2-3; 3-4  . Tonsillectomy      "when I was a small child"  . Functional endoscopic sinus surgery  1988  . Posterior fusion lumbar spine  11/2009    L3-4;  L4-5  . Joint replacement  2009; 2006    joints 2 fingers,rt hand; joint left thumb   Social History:   reports that she has been passively smoking Cigarettes.  She has a 12.5 pack-year smoking history. She has never used smokeless tobacco. She reports that she drinks about 2.4 ounces of alcohol per week. She reports that she does not use illicit drugs.  Family History  Problem Relation Age of Onset  . Heart disease Mother   . Cancer Father     lung    Medications: Patient's Medications  New Prescriptions   No medications on file  Previous Medications   ADALIMUMAB (HUMIRA) 40 MG/0.8ML INJECTION    Inject 0.8 mLs (40 mg total) into the skin every 14 (fourteen) days. Resume ONLY if off all antibiotics   ASPIRIN EC 81 MG TABLET    Take  81 mg by mouth every other day.   BALSALAZIDE (COLAZAL) 750 MG CAPSULE    Take 2,250 mg by mouth 3 (three) times daily.   CALCIUM CARB-CHOLECALCIFEROL (CALCIUM + D3) 600-200 MG-UNIT TABS    Take 1 tablet by mouth daily.   CELEBREX 200 MG CAPSULE    Take two tablets once daily for pains   CHOLECALCIFEROL (VITAMIN D3) 1000 UNITS CAPS    Take 1,000 Units by mouth daily.   DIAZEPAM (VALIUM) 5 MG TABLET    Take 1 tablet (5 mg total) by mouth every 6 (six) hours as needed. For muscle spasms.   ESTRADIOL (ESTRACE) 0.5 MG TABLET    Take one tablet once daily for hormone   ESTROGEN, CONJUGATED,-MEDROXYPROGESTERONE (PREMPRO) 0.3-1.5 MG PER TABLET    Take 1 tablet by mouth daily.   FOLIC ACID (FOLVITE) 1 MG TABLET    Take 2 mg by mouth daily.   GUAIFENESIN (MUCINEX) 600 MG 12 HR TABLET    Take  1,200 mg by mouth daily as needed. Sinus relief   HYDROCHLOROTHIAZIDE (HYDRODIURIL) 25 MG TABLET    Take 25 mg by mouth daily.   HYDROCODONE-ACETAMINOPHEN (NORCO/VICODIN) 5-325 MG PER TABLET    Take one tablet every 4-6 hours as needed for pain   LATANOPROST (XALATAN) 0.005 % OPHTHALMIC SOLUTION    Place 1 drop into both eyes at bedtime.   LEFLUNOMIDE (ARAVA) 20 MG TABLET    Take 1 tablet (20 mg total) by mouth daily. Resume when off antibiotics and infection resolved   LEVOTHYROXINE (SYNTHROID) 88 MCG TABLET    Take 1 tablet (88 mcg total) by mouth daily before breakfast.   MULTIPLE VITAMIN (MULTIVITAMIN WITH MINERALS) TABS    Take 1 tablet by mouth daily.   OMEGA-3 FATTY ACIDS (FISH OIL) 1200 MG CAPS    Take 1,200 mg by mouth daily.   PREDNISONE (DELTASONE) 5 MG TABLET    Take 5 mg by mouth daily. With 2mg  to =7mg   Modified Medications   No medications on file  Discontinued Medications   No medications on file     Physical Exam:  Filed Vitals:   05/21/13 1458  BP: 160/92  Pulse: 66  Resp: 10  Weight: 139 lb (63.05 kg)    Physical Exam  Constitutional: She is oriented to person, place, and time and well-developed, well-nourished, and in no distress.  Cardiovascular: Normal rate, regular rhythm and normal heart sounds.   Pulmonary/Chest: Effort normal and breath sounds normal. No respiratory distress.  Abdominal: Soft. Bowel sounds are normal. She exhibits no distension. There is no tenderness.  Musculoskeletal: She exhibits edema (trace at the ankles, no swelling into legs, no heat, no pain). She exhibits no tenderness.  Neurological: She is alert and oriented to person, place, and time.  Skin: Skin is warm and dry. She is not diaphoretic. No erythema.  Psychiatric: Affect normal.     Labs reviewed: Basic Metabolic Panel:  Recent Labs  07/29/12 0921 03/14/13 0832  TSH 1.030 1.060   Liver Function Tests: No results found for this basename: AST, ALT, ALKPHOS, BILITOT,  PROT, ALBUMIN,  in the last 8760 hours No results found for this basename: LIPASE, AMYLASE,  in the last 8760 hours No results found for this basename: AMMONIA,  in the last 8760 hours CBC:  Recent Labs  07/29/12 0921  WBC 5.1  NEUTROABS 2.4  HGB 14.5  HCT 44.5  MCV 93   Lipid Panel:  Recent Labs  03/14/13 0832  HDL  105  LDLCALC 154*  TRIG 110  CHOLHDL 2.7   TSH:  Recent Labs  07/29/12 0921 03/14/13 0832  TSH 1.030 1.060   A1C: No components found with this basename: A1C,    Assessment/Plan  1. LEG SWELLING -elevate legs above level of heart when sitting -avoid food high in sodium and salt -TED hose- to apply in morning and take off in the evening  2. Unspecified constipation -colace 1-2 times daily -increase water intake -bene-fiber into 8-10 oz fluid of choice daily  3. Muscle spasm -muscle rubs (biofreeze or ben gay) as needed  -occasional muscle spasms in bilateral thighs, has used PRN valium in the past with good relief; will provide refill at this time - diazepam (VALIUM) 5 MG tablet; Take 1 tablet (5 mg total) by mouth every 6 (six) hours as needed. For muscle spasms.  Dispense: 15 tablet; Refill: 0  To keep follow up with Dr Mariea Clonts, follow up as needed before if needed for worsening edema and spasm

## 2013-06-24 ENCOUNTER — Other Ambulatory Visit (HOSPITAL_COMMUNITY): Payer: Self-pay | Admitting: Neurological Surgery

## 2013-06-24 ENCOUNTER — Other Ambulatory Visit: Payer: Self-pay | Admitting: Neurological Surgery

## 2013-06-24 DIAGNOSIS — M5000 Cervical disc disorder with myelopathy, unspecified cervical region: Secondary | ICD-10-CM

## 2013-06-24 DIAGNOSIS — M48061 Spinal stenosis, lumbar region without neurogenic claudication: Secondary | ICD-10-CM

## 2013-06-27 ENCOUNTER — Encounter (HOSPITAL_COMMUNITY): Payer: Self-pay

## 2013-06-30 ENCOUNTER — Encounter (HOSPITAL_COMMUNITY): Payer: Self-pay

## 2013-06-30 ENCOUNTER — Ambulatory Visit (HOSPITAL_COMMUNITY)
Admission: RE | Admit: 2013-06-30 | Discharge: 2013-06-30 | Disposition: A | Discharge status: Home / Self Care | Payer: Medicare Other | Source: Ambulatory Visit | Attending: Neurological Surgery | Admitting: Neurological Surgery

## 2013-06-30 DIAGNOSIS — M48061 Spinal stenosis, lumbar region without neurogenic claudication: Secondary | ICD-10-CM

## 2013-06-30 DIAGNOSIS — M48062 Spinal stenosis, lumbar region with neurogenic claudication: Secondary | ICD-10-CM | POA: Insufficient documentation

## 2013-06-30 DIAGNOSIS — M4712 Other spondylosis with myelopathy, cervical region: Secondary | ICD-10-CM | POA: Diagnosis not present

## 2013-06-30 DIAGNOSIS — M5000 Cervical disc disorder with myelopathy, unspecified cervical region: Secondary | ICD-10-CM

## 2013-06-30 MED ORDER — OXYCODONE-ACETAMINOPHEN 5-325 MG PO TABS
1.0000 | ORAL_TABLET | ORAL | Status: DC | PRN
  Administered 2013-06-30: 2 via ORAL

## 2013-06-30 MED ORDER — IOHEXOL 300 MG/ML  SOLN
10.0000 mL | Freq: Once | INTRAMUSCULAR | Status: AC | PRN
  Administered 2013-06-30: 10 mL via INTRATHECAL

## 2013-06-30 MED ORDER — DIAZEPAM 5 MG PO TABS
ORAL_TABLET | ORAL | Status: AC
  Filled 2013-06-30: qty 2

## 2013-06-30 MED ORDER — DEXAMETHASONE 6 MG PO TABS
6.0000 mg | ORAL_TABLET | Freq: Once | ORAL | Status: AC
  Administered 2013-06-30: 6 mg via ORAL
  Filled 2013-06-30 (×2): qty 1

## 2013-06-30 MED ORDER — OXYCODONE-ACETAMINOPHEN 5-325 MG PO TABS
ORAL_TABLET | ORAL | Status: AC
  Filled 2013-06-30: qty 2

## 2013-06-30 MED ORDER — DIAZEPAM 5 MG PO TABS
10.0000 mg | ORAL_TABLET | Freq: Once | ORAL | Status: AC
  Administered 2013-06-30: 10 mg via ORAL
  Filled 2013-06-30: qty 2

## 2013-06-30 NOTE — Progress Notes (Signed)
BP was elevated pre procedure, slightly lower now. Pain in lower back 1-2/10. Bandaid to back clean dry and intact. Has not taken BP meds today. Told patient to take as soon as she gets home. Discharge instructions reviewed with patient by B Hiram Comber RN and instruction sheets given. Husband will be here to pick her up at 1335.

## 2013-06-30 NOTE — Progress Notes (Signed)
Discharged in w/c per L Khrystyna Schwalm RN to be driven home by husband.

## 2013-06-30 NOTE — Procedures (Signed)
[name] was evaluated in the past and noted that she was having some persistent problems with back pain felt to be related to spondylitic disease in the upper lumbar spine.  In October and December, we did some epidural steroid injection via the translaminar route.  [name] notes that these have had mild to minimal effect on relieving any of the symptoms.  She did feel that she made good progress with physical therapy to relieve the worst of her symptoms, but she notes that she was having ongoing problems.  She characterized these note to me today indicating that she is still having neck pain, not debilitating just constantly present from behind the ear just tends across her left shoulder and into the upper arm, slight pain in the right shoulder is also noted.  Some tingling in the left arm and into the hand especially the thumb, not constant, but a bit more frequent than in the past.  The physical therapist is working to help alleviate that problem.  She notes that arising from the seated position is difficult and painful, but better than it was before physical therapy.  Stair climbing is difficult, often painful and walking is often painful.  She notes that she cannot stand for more than 15-20 minutes without needing to sit down briefly.  She feels the physical therapy has been very beneficial.  The debilitating and severe pain in her left buttock has been greatly alleviated.  Mobility and flexibility have improved.  Sitting is much more comfortable.  The biggest problem that has occurred in the last two weeks.  For the last two weeks, she notes that there is a new pain that has developed in the low back across her hips especially left with certain movements as well as pain in her thighs leading to more pain in walking and climbing stairs.  She feels that some new process may be occurring.  Reviewing her last MRIs, I noted that she had degenerative changes at the adjacent level above her fusion namely  retrolisthesis at the L1-2 level.  T12-L1 looks stable and she also has a chronic disc herniation with significant spondylitic stenosis at T11-12.  These are the areas that we were trying to address with the epidural steroid injections which have had limited yield.  L5-S1 on the lower end of her spine has always appeared quite healthy.  I have indicated that the next step for [name] would be to work this up with a myelogram and a postmyelogram CAT scan.  Motion films can be performed to see if there is any abnormal motion or worsening stenosis.  If this is the case, then we may need to discuss what surgical intervention may help with this process.  I do not feel that an additional steroid injection at this time is likely to yield much improvement as the last two have yielded very little.  In the meantime, she can continue her physical therapy, but we will schedule myelography at the earliest convenience.  After further consideration and discussion with [name], knowing that she has advanced spondylitic changes in the cervical spine, we will do a total myelogram to look at the cervical region also in addition to all regions of the thoracic spine and lumbar spine.  Pre op Dx: Lumbar and cervical spondylosis with myelopathy Post op Dx: Lumbar and cervical spondylosis with myelopathy Procedure: Total myelogram Surgeon: Amal Saiki Puncture level: 34 Fluid color: Clear colorless Injection: 10 cc iohexol 300 Findings: Diffuse spondylosis cervical and lumbar for evaluation  with CT scan.

## 2013-07-05 ENCOUNTER — Other Ambulatory Visit: Payer: Self-pay | Admitting: Internal Medicine

## 2013-07-16 ENCOUNTER — Ambulatory Visit (INDEPENDENT_AMBULATORY_CARE_PROVIDER_SITE_OTHER): Payer: Medicare Other | Admitting: Nurse Practitioner

## 2013-07-16 ENCOUNTER — Encounter: Payer: Self-pay | Admitting: Nurse Practitioner

## 2013-07-16 VITALS — BP 136/72 | HR 52 | Temp 98.9°F | Resp 10 | Wt 138.0 lb

## 2013-07-16 DIAGNOSIS — I10 Essential (primary) hypertension: Secondary | ICD-10-CM

## 2013-07-16 MED ORDER — LISINOPRIL 10 MG PO TABS: 10.0000 mg | ORAL_TABLET | Freq: Every day | ORAL | Status: DC

## 2013-07-16 NOTE — Progress Notes (Signed)
Patient ID: [name], female   DOB: 1934/06/03, 79 y.o.   MRN: [id number]    No Known Allergies  Chief Complaint  Patient presents with  . Blood Pressure Concerns    Elevated B/P possibly related to pain increase     HPI: Patient is a 79 y.o. female seen in the office today for blood pressure check. Reports when she went to get epidurals blood pressure was staying around 170s and once in 190s-- had not taken blood pressure medications at either visit however has taken blood pressure medications other times and it has been elevated. Following with neurosurgery and he said her spinal stenosis is very severe and needing surgery, having anxiety due to this. Also wants to make sure her blood pressure is well controlled if she needs surgery. Only taking HCTZ.  Reports rheumatology takes her blood routinely  Review of Systems:  Review of Systems  HENT: Positive for hearing loss. Negative for nosebleeds and tinnitus.   Eyes: Negative for blurred vision.  Respiratory: Negative for cough, sputum production and shortness of breath.   Cardiovascular: Negative for chest pain, palpitations and leg swelling.  Musculoskeletal: Positive for back pain (needing surgery).  Neurological: Positive for tingling (left arm). Negative for dizziness and headaches.  Psychiatric/Behavioral: The patient is nervous/anxious (due to pain and possible surgery).      Past Medical History  Diagnosis Date  . Ulcerative colitis   . Hypothyroidism   . Hypertension     pcp   dr reed   [facility]  . Complication of anesthesia 2009    htn with block  . Rheumatoid arthritis(714.0)   . Pneumonia 2008  . Chronic bronchitis 10/31/2011    "prone to it; I have sinus/bronchitis problem probably q yr"  . Multiple skin tears 10/31/2011    "get them very easily; my skin is thin and I bruise easily"  . Chronic back pain     "my entire spine"  . Edema   . Other and unspecified hyperlipidemia   . Encounter for  long-term (current) use of other medications   . Senile osteoporosis   . Spinal stenosis, unspecified region other than cervical   . Unspecified glaucoma   . Hypothyroidism    Past Surgical History  Procedure Laterality Date  . Back surgery    . Sinus surgery with instatrak    . Thyroidectomy, partial  1997  . Posterior fusion lumbar spine  10/31/2011    L2-3; 3-4  . Tonsillectomy      "when I was a small child"  . Functional endoscopic sinus surgery  1988  . Posterior fusion lumbar spine  11/2009    L3-4;  L4-5  . Joint replacement  2009; 2006    joints 2 fingers,rt hand; joint left thumb   Social History:   reports that she has been passively smoking Cigarettes.  She has a 12.5 pack-year smoking history. She has never used smokeless tobacco. She reports that she drinks about 2.4 ounces of alcohol per week. She reports that she does not use illicit drugs.  Family History  Problem Relation Age of Onset  . Heart disease Mother   . Cancer Father     lung    Medications: Patient's Medications  New Prescriptions   No medications on file  Previous Medications   ADALIMUMAB (HUMIRA) 40 MG/0.8ML INJECTION    Inject 0.8 mLs (40 mg total) into the skin every 14 (fourteen) days. Resume ONLY if off all antibiotics  ASPIRIN EC 81 MG TABLET    Take 81 mg by mouth every other day.   BALSALAZIDE (COLAZAL) 750 MG CAPSULE    Take 2,250 mg by mouth 3 (three) times daily.   CALCIUM CARB-CHOLECALCIFEROL (CALCIUM + D3) 600-200 MG-UNIT TABS    Take 1 tablet by mouth daily.   CELEBREX 200 MG CAPSULE    Take 400 mg by mouth daily.    CHOLECALCIFEROL (VITAMIN D3) 1000 UNITS CAPS    Take 1,000 Units by mouth daily.   DIAZEPAM (VALIUM) 5 MG TABLET    Take 1 tablet (5 mg total) by mouth every 6 (six) hours as needed. For muscle spasms.   ESTRADIOL (ESTRACE) 0.5 MG TABLET    Take 0.5 mg by mouth daily.    ESTROGEN, CONJUGATED,-MEDROXYPROGESTERONE (PREMPRO) 0.3-1.5 MG PER TABLET    Take 1 tablet by mouth  daily.   FOLIC ACID (FOLVITE) 1 MG TABLET    Take 2 mg by mouth daily.   GUAIFENESIN (MUCINEX) 600 MG 12 HR TABLET    Take 1,200 mg by mouth daily as needed. Sinus relief   HYDROCHLOROTHIAZIDE (HYDRODIURIL) 25 MG TABLET    Take 25 mg by mouth daily.   HYDROCODONE-ACETAMINOPHEN (NORCO/VICODIN) 5-325 MG PER TABLET    Take one tablet every 6 hours as needed for pain   LATANOPROST (XALATAN) 0.005 % OPHTHALMIC SOLUTION    Place 1 drop into both eyes at bedtime.   LEFLUNOMIDE (ARAVA) 10 MG TABLET    Take 10 mg by mouth. 2 by mouth daily   MULTIPLE VITAMIN (MULTIVITAMIN WITH MINERALS) TABS    Take 1 tablet by mouth daily.   OMEGA-3 FATTY ACIDS (FISH OIL) 1200 MG CAPS    Take 1,200 mg by mouth daily.   PREDNISONE (DELTASONE) 5 MG TABLET    Take 5 mg by mouth daily with breakfast.    SYNTHROID 88 MCG TABLET    TAKE 1 TABLET BY MOUTH DAILY BEFORE BREAKFAST  Modified Medications   No medications on file  Discontinued Medications   LEFLUNOMIDE (ARAVA) 20 MG TABLET    Take 1 tablet (20 mg total) by mouth daily. Resume when off antibiotics and infection resolved     Physical Exam:  Filed Vitals:   07/16/13 1422  BP: 136/72  Pulse: 52  Temp: 98.9 F (37.2 C)  TempSrc: Oral  Resp: 10  Weight: 138 lb (62.596 kg)    Physical Exam  Constitutional: She is oriented to person, place, and time and well-developed, well-nourished, and in no distress.  Cardiovascular: Normal rate, regular rhythm and normal heart sounds.  Exam reveals decreased pulses.   Pulses:      Radial pulses are 1+ on the right side, and 1+ on the left side.  Pulmonary/Chest: Effort normal and breath sounds normal. No respiratory distress.  Abdominal: Soft. Bowel sounds are normal. She exhibits no distension. There is no tenderness.  Musculoskeletal: She exhibits no edema and no tenderness.  Neurological: She is alert and oriented to person, place, and time.  Skin: Skin is warm and dry. She is not diaphoretic. No erythema.    Psychiatric: Affect normal.     Labs reviewed: Basic Metabolic Panel:  Recent Labs  07/29/12 0921 03/14/13 0832  TSH 1.030 1.060   Liver Function Tests: No results found for this basename: AST, ALT, ALKPHOS, BILITOT, PROT, ALBUMIN,  in the last 8760 hours No results found for this basename: LIPASE, AMYLASE,  in the last 8760 hours No results found for this basename:  AMMONIA,  in the last 8760 hours CBC:  Recent Labs  07/29/12 0921  WBC 5.1  NEUTROABS 2.4  HGB 14.5  HCT 44.5  MCV 93   Lipid Panel:  Recent Labs  03/14/13 0832  HDL 105  LDLCALC 154*  TRIG 110  CHOLHDL 2.7   TSH:  Recent Labs  07/29/12 0921 03/14/13 0832  TSH 1.030 1.060   A1C: No results found for this basename: HGBA1C    Assessment/Plan  1. Essential hypertension, benign -repeat blood pressure with pts cuff was sbp in the 170s, manuel was 156/82 -pt instructed to get new cuff, will add lisinopril and have pt take blood pressure in both arms at first -record bp and bring to visit  -diet modifications - lisinopril (PRINIVIL,ZESTRIL) 10 MG tablet; Take 1 tablet (10 mg total) by mouth daily. For high blood pressure  Dispense: 30 tablet; Refill: 0 - Basic metabolic panel   Follow up in 2 weeks with blood pressure log

## 2013-07-16 NOTE — Patient Instructions (Signed)
Get new blood pressure cuff-- take blood pressure daily after taking medications and sitting for 5 mins- record and bring readings to next visit We will get blood work today and start you on new medication for blood pressure  Follow up in 2 weeks

## 2013-07-17 ENCOUNTER — Encounter: Payer: Self-pay | Admitting: *Deleted

## 2013-07-17 LAB — BASIC METABOLIC PANEL
BUN/Creatinine Ratio: 30 — ABNORMAL HIGH (ref 11–26)
BUN: 27 mg/dL (ref 8–27)
CHLORIDE: 101 mmol/L (ref 97–108)
CO2: 27 mmol/L (ref 18–29)
CREATININE: 0.89 mg/dL (ref 0.57–1.00)
Calcium: 9.5 mg/dL (ref 8.7–10.3)
GFR, EST AFRICAN AMERICAN: 73 mL/min/{1.73_m2} (ref >59)
GFR, EST NON AFRICAN AMERICAN: 63 mL/min/{1.73_m2} (ref >59)
GLUCOSE: 91 mg/dL (ref 65–99)
Potassium: 4 mmol/L (ref 3.5–5.2)
Sodium: 142 mmol/L (ref 134–144)

## 2013-07-24 ENCOUNTER — Other Ambulatory Visit: Payer: Self-pay | Admitting: Neurological Surgery

## 2013-07-29 ENCOUNTER — Encounter (HOSPITAL_COMMUNITY): Payer: Self-pay | Admitting: Pharmacy Technician

## 2013-07-30 ENCOUNTER — Encounter: Payer: Self-pay | Admitting: Nurse Practitioner

## 2013-07-30 ENCOUNTER — Ambulatory Visit (INDEPENDENT_AMBULATORY_CARE_PROVIDER_SITE_OTHER): Payer: Medicare Other | Admitting: Nurse Practitioner

## 2013-07-30 VITALS — BP 140/60 | HR 71 | Temp 99.0°F | Resp 18 | Ht 67.0 in | Wt 134.2 lb

## 2013-07-30 DIAGNOSIS — I10 Essential (primary) hypertension: Secondary | ICD-10-CM

## 2013-07-30 MED ORDER — HYDROCHLOROTHIAZIDE 25 MG PO TABS: 25.0000 mg | ORAL_TABLET | Freq: Every day | ORAL | Status: DC

## 2013-07-30 MED ORDER — LISINOPRIL 5 MG PO TABS
5.0000 mg | ORAL_TABLET | Freq: Every day | ORAL | Status: DC
Start: 2013-07-30 — End: 2013-08-27

## 2013-07-30 NOTE — Patient Instructions (Addendum)
Decrease lisinopril to 5 mg - new prescription has been sent   To call and make appt after back surgery

## 2013-07-30 NOTE — Progress Notes (Signed)
Patient ID: [name], female   DOB: Nov 10, 1934, 79 y.o.   MRN: [id number]    No Known Allergies  Chief Complaint  Patient presents with  . Follow-up    HPI: Patient is a 79 y.o. female seen in the office today for follow up blood pressure, started on lisinopril after sbp of 170s-200s Now on lisinopril 10 mg and blood pressures ranging from 98-132/50s.  reports feeling tired but changed pain medication at the same time from Hydrocodone to oxycodone and really feels different when she takes the oxycodone Surgery is planned for next week. 5/26 Denies headache, blurred vision, shortness of breath, chest pain or discomfort  Review of Systems:  Review of Systems  Constitutional: Negative for malaise/fatigue.  HENT: Positive for hearing loss. Negative for nosebleeds and tinnitus.   Eyes: Negative for blurred vision.  Respiratory: Negative for cough, sputum production and shortness of breath.   Cardiovascular: Negative for chest pain, palpitations and leg swelling.  Musculoskeletal: Positive for back pain (surgery planned for next week).  Neurological: Positive for tingling (left arm). Negative for dizziness, weakness and headaches.  Psychiatric/Behavioral: The patient is nervous/anxious (due to pain and possible surgery).      Past Medical History  Diagnosis Date  . Ulcerative colitis   . Hypothyroidism   . Hypertension     pcp   dr reed   [facility]  . Complication of anesthesia 2009    htn with block  . Rheumatoid arthritis(714.0)   . Pneumonia 2008  . Chronic bronchitis 10/31/2011    "prone to it; I have sinus/bronchitis problem probably q yr"  . Multiple skin tears 10/31/2011    "get them very easily; my skin is thin and I bruise easily"  . Chronic back pain     "my entire spine"  . Edema   . Other and unspecified hyperlipidemia   . Encounter for long-term (current) use of other medications   . Senile osteoporosis   . Spinal stenosis, unspecified region other  than cervical   . Unspecified glaucoma   . Hypothyroidism    Past Surgical History  Procedure Laterality Date  . Back surgery    . Sinus surgery with instatrak    . Thyroidectomy, partial  1997  . Posterior fusion lumbar spine  10/31/2011    L2-3; 3-4  . Tonsillectomy      "when I was a small child"  . Functional endoscopic sinus surgery  1988  . Posterior fusion lumbar spine  11/2009    L3-4;  L4-5  . Joint replacement  2009; 2006    joints 2 fingers,rt hand; joint left thumb   Social History:   reports that she has been passively smoking Cigarettes.  She has a 12.5 pack-year smoking history. She has never used smokeless tobacco. She reports that she drinks about 2.4 ounces of alcohol per week. She reports that she does not use illicit drugs.  Family History  Problem Relation Age of Onset  . Heart disease Mother   . Cancer Father     lung    Medications: Patient's Medications  New Prescriptions   No medications on file  Previous Medications   ADALIMUMAB (HUMIRA) 40 MG/0.8ML INJECTION    Inject 0.8 mLs (40 mg total) into the skin every 14 (fourteen) days. Resume ONLY if off all antibiotics   ASPIRIN EC 81 MG TABLET    Take 81 mg by mouth every other day.   BALSALAZIDE (COLAZAL) 750 MG CAPSULE  Take 2,250 mg by mouth 3 (three) times daily.   CALCIUM CARB-CHOLECALCIFEROL (CALCIUM + D3) 600-200 MG-UNIT TABS    Take 1 tablet by mouth daily.   CELEBREX 200 MG CAPSULE    Take 400 mg by mouth daily.    CHOLECALCIFEROL (VITAMIN D3) 1000 UNITS CAPS    Take 1,000 Units by mouth daily.   DIAZEPAM (VALIUM) 5 MG TABLET    Take 1 tablet (5 mg total) by mouth every 6 (six) hours as needed. For muscle spasms.   ESTRADIOL (ESTRACE) 0.5 MG TABLET    Take 0.5 mg by mouth daily.    FOLIC ACID (FOLVITE) 1 MG TABLET    Take 2 mg by mouth daily.   GUAIFENESIN (MUCINEX) 600 MG 12 HR TABLET    Take 1,200 mg by mouth daily as needed (sinus relief).    HYDROCODONE-ACETAMINOPHEN (NORCO/VICODIN) 5-325  MG PER TABLET    Take 1 tablet by mouth every 6 (six) hours as needed (pain).    LATANOPROST (XALATAN) 0.005 % OPHTHALMIC SOLUTION    Place 1 drop into both eyes at bedtime.   LEFLUNOMIDE (ARAVA) 10 MG TABLET    Take 20 mg by mouth daily.    LEVOTHYROXINE (SYNTHROID, LEVOTHROID) 88 MCG TABLET    Take 88 mcg by mouth daily before breakfast.   LISINOPRIL (PRINIVIL,ZESTRIL) 10 MG TABLET    Take 1 tablet (10 mg total) by mouth daily. For high blood pressure   MEDROXYPROGESTERONE (PROVERA) 2.5 MG TABLET    Take 2.5 mg by mouth at bedtime.    MULTIPLE VITAMIN (MULTIVITAMIN WITH MINERALS) TABS    Take 1 tablet by mouth daily.   OMEGA-3 FATTY ACIDS (FISH OIL) 1200 MG CAPS    Take 1,200 mg by mouth daily.   OXYCODONE-ACETAMINOPHEN (PERCOCET/ROXICET) 5-325 MG PER TABLET    Take 1 tablet by mouth every 6 (six) hours as needed (pain).    PREDNISONE (DELTASONE) 5 MG TABLET    Take 5 mg by mouth daily with breakfast.   Modified Medications   Modified Medication Previous Medication   HYDROCHLOROTHIAZIDE (HYDRODIURIL) 25 MG TABLET hydrochlorothiazide (HYDRODIURIL) 25 MG tablet      Take 1 tablet (25 mg total) by mouth daily.    Take 25 mg by mouth daily.  Discontinued Medications   No medications on file     Physical Exam:  Filed Vitals:   07/30/13 1057  BP: 140/60  Pulse: 71  Temp: 99 F (37.2 C)  TempSrc: Oral  Resp: 18  Height: 5\' 7"  (1.702 m)  Weight: 134 lb 3.2 oz (60.873 kg)  SpO2: 95%    Physical Exam  Constitutional: She is oriented to person, place, and time and well-developed, well-nourished, and in no distress.  Eyes: Conjunctivae and EOM are normal. Pupils are equal, round, and reactive to light.  Neck: Normal range of motion. Neck supple. No JVD present.  Cardiovascular: Normal rate, regular rhythm and normal heart sounds.   Pulmonary/Chest: Effort normal and breath sounds normal. No respiratory distress.  Abdominal: Soft. Bowel sounds are normal. She exhibits no distension.  There is no tenderness.  Musculoskeletal: She exhibits no edema and no tenderness.  Neurological: She is alert and oriented to person, place, and time.  Skin: Skin is warm and dry. She is not diaphoretic. No erythema.  Psychiatric: Affect normal.     Labs reviewed: Basic Metabolic Panel:  Recent Labs  03/14/13 0832 07/16/13 1505  NA  --  142  K  --  4.0  CL  --  101  CO2  --  27  GLUCOSE  --  91  BUN  --  27  CREATININE  --  0.89  CALCIUM  --  9.5  TSH 1.060  --    Liver Function Tests: No results found for this basename: AST, ALT, ALKPHOS, BILITOT, PROT, ALBUMIN,  in the last 8760 hours No results found for this basename: LIPASE, AMYLASE,  in the last 8760 hours No results found for this basename: AMMONIA,  in the last 8760 hours CBC: No results found for this basename: WBC, NEUTROABS, HGB, HCT, MCV, PLT,  in the last 8760 hours Lipid Panel:  Recent Labs  03/14/13 0832  HDL 105  LDLCALC 154*  TRIG 110  CHOLHDL 2.7   TSH:  Recent Labs  03/14/13 0832  TSH 1.060   A1C: No components found with this basename: A1C,    Assessment/Plan   Essential hypertension, benign -some low bp readings other, will decrease to 5 as low blood pressure may be effecting pt -to cont taking HCTZ -cont to take blood pressure occasionally -has blood work scheduled for tomorrow due to back surgery - lisinopril (PRINIVIL,ZESTRIL) 5 MG tablet; Take 1 tablet (5 mg total) by mouth daily. For high blood pressure  Dispense: 30 tablet; Refill: 3  -to keep follow up appt with [name]

## 2013-07-31 ENCOUNTER — Encounter (HOSPITAL_COMMUNITY)
Admission: RE | Admit: 2013-07-31 | Discharge: 2013-07-31 | Disposition: A | Discharge status: Home / Self Care | Payer: Medicare Other | Source: Ambulatory Visit | Attending: Neurological Surgery | Admitting: Neurological Surgery

## 2013-07-31 ENCOUNTER — Encounter (HOSPITAL_COMMUNITY): Payer: Self-pay

## 2013-07-31 DIAGNOSIS — Z01812 Encounter for preprocedural laboratory examination: Secondary | ICD-10-CM | POA: Insufficient documentation

## 2013-07-31 DIAGNOSIS — Z01818 Encounter for other preprocedural examination: Secondary | ICD-10-CM | POA: Insufficient documentation

## 2013-07-31 DIAGNOSIS — Z01810 Encounter for preprocedural cardiovascular examination: Secondary | ICD-10-CM | POA: Insufficient documentation

## 2013-07-31 DIAGNOSIS — R05 Cough: Secondary | ICD-10-CM | POA: Insufficient documentation

## 2013-07-31 DIAGNOSIS — R197 Diarrhea, unspecified: Secondary | ICD-10-CM | POA: Insufficient documentation

## 2013-07-31 DIAGNOSIS — R059 Cough, unspecified: Secondary | ICD-10-CM | POA: Insufficient documentation

## 2013-07-31 HISTORY — DX: Anxiety disorder, unspecified: F41.9

## 2013-07-31 LAB — BASIC METABOLIC PANEL
BUN: 24 mg/dL — ABNORMAL HIGH (ref 6–23)
CO2: 25 mEq/L (ref 19–32)
Calcium: 9.5 mg/dL (ref 8.4–10.5)
Chloride: 103 mEq/L (ref 96–112)
Creatinine, Ser: 0.76 mg/dL (ref 0.50–1.10)
GFR calc non Af Amer: 80 mL/min — ABNORMAL LOW (ref >90)
Glucose, Bld: 80 mg/dL (ref 70–99)
Potassium: 3.8 mEq/L (ref 3.7–5.3)
SODIUM: 140 meq/L (ref 137–147)

## 2013-07-31 LAB — CBC
HCT: 40.5 % (ref 36.0–46.0)
HEMOGLOBIN: 13.1 g/dL (ref 12.0–15.0)
MCH: 30 pg (ref 26.0–34.0)
MCHC: 32.3 g/dL (ref 30.0–36.0)
MCV: 92.9 fL (ref 78.0–100.0)
Platelets: 241 10*3/uL (ref 150–400)
RBC: 4.36 MIL/uL (ref 3.87–5.11)
RDW: 14.4 % (ref 11.5–15.5)
WBC: 7.5 10*3/uL (ref 4.0–10.5)

## 2013-07-31 LAB — SURGICAL PCR SCREEN
MRSA, PCR: NEGATIVE
STAPHYLOCOCCUS AUREUS: NEGATIVE

## 2013-07-31 LAB — TYPE AND SCREEN
ABO/RH(D): B POS
ANTIBODY SCREEN: NEGATIVE

## 2013-07-31 NOTE — Pre-Procedure Instructions (Signed)
BRECKLYN GALVIS  07/31/2013   Your procedure is scheduled on: Tuesday, Aug 05, 2013  Report to [facility] (use Main Entrance "A'') at 8:00 AM.  Call this number if you have problems the morning of surgery: [phone]   Remember:   Do not eat food or drink liquids after midnight.   Take these medicines the morning of surgery with A SIP OF WATER: balsalazide (COLAZAL), estradiol (ESTRACE) leflunomide (ARAVA), evothyroxine (SYNTHROID), if needed:Oxycodone for pain, OR Hydrocodone for pain Stop taking Aspirin,vitamins and herbal medications  ( Fish Oil ). Do not take any NSAIDs ie: Ibuprofen, Advil, Naproxen or any medication containing Aspirin ( Celebrex)  Do not wear jewelry, make-up or nail polish.   Do not wear lotions, powders, or perfumes. You may wear deodorant.   Do not shave 48 hours prior to surgery.    Do not bring valuables to the hospital.  [facility] is not responsible for any belongings or valuables.               Contacts, dentures or bridgework may not be worn into surgery.   Leave suitcase in the car. After surgery it may be brought to your room.   For patients admitted to the hospital, discharge time is determined by your treatment team.               Patients discharged the day of surgery will not be allowed to drive home.  Name and phone number of your driver: /w spouse  Special Instructions:  Special Instructions:Special Instructions: [place] - Preparing for Surgery  Before surgery, you can play an important role.  Because skin is not sterile, your skin needs to be as free of germs as possible.  You can reduce the number of germs on you skin by washing with CHG (chlorahexidine gluconate) soap before surgery.  CHG is an antiseptic cleaner which kills germs and bonds with the skin to continue killing germs even after washing.  Please DO NOT use if you have an allergy to CHG or antibacterial soaps.  If your skin becomes reddened/irritated stop using  the CHG and inform your nurse when you arrive at Short Stay.  Do not shave (including legs and underarms) for at least 48 hours prior to the first CHG shower.  You may shave your face.  Please follow these instructions carefully:   1.  Shower with CHG Soap the night before surgery and the morning of Surgery.  2.  If you choose to wash your hair, wash your hair first as usual with your normal shampoo.  3.  After you shampoo, rinse your hair and body thoroughly to remove the Shampoo.  4.  Use CHG as you would any other liquid soap.  You can apply chg directly  to the skin and wash gently with scrungie or a clean washcloth.  5.  Apply the CHG Soap to your body ONLY FROM THE NECK DOWN.  Do not use on open wounds or open sores.  Avoid contact with your eyes, ears, mouth and genitals (private parts).  Wash genitals (private parts) with your normal soap.  6.  Wash thoroughly, paying special attention to the area where your surgery will be performed.  7.  Thoroughly rinse your body with warm water from the neck down.  8.  DO NOT shower/wash with your normal soap after using and rinsing off the CHG Soap.  9.  Pat yourself dry with a clean towel.  10.  Wear clean pajamas.            11.  Place clean sheets on your bed the night of your first shower and do not sleep with pets.  Day of Surgery  Do not apply any lotions the morning of surgery.  Please wear clean clothes to the hospital/surgery center.   Please read over the following fact sheets that you were given: Pain Booklet, Coughing and Deep Breathing, Blood Transfusion Information, MRSA Information and Surgical Site Infection Prevention

## 2013-08-04 MED ORDER — CEFAZOLIN SODIUM-DEXTROSE 2-3 GM-% IV SOLR
2.0000 g | INTRAVENOUS | Status: AC
  Administered 2013-08-05 (×2): 2 g via INTRAVENOUS
  Filled 2013-08-04: qty 50

## 2013-08-05 ENCOUNTER — Inpatient Hospital Stay (HOSPITAL_COMMUNITY): Payer: Medicare Other

## 2013-08-05 ENCOUNTER — Inpatient Hospital Stay (HOSPITAL_COMMUNITY)
Admission: RE | Admit: 2013-08-05 | Discharge: 2013-08-09 | DRG: 460 | Disposition: A | Discharge status: Home / Self Care | Payer: Medicare Other | Source: Ambulatory Visit | Attending: Neurological Surgery | Admitting: Neurological Surgery

## 2013-08-05 ENCOUNTER — Encounter (HOSPITAL_COMMUNITY)
Admission: RE | Disposition: A | Discharge status: Home / Self Care | Payer: Self-pay | Source: Ambulatory Visit | Attending: Neurological Surgery

## 2013-08-05 ENCOUNTER — Encounter (HOSPITAL_COMMUNITY): Payer: Self-pay | Admitting: *Deleted

## 2013-08-05 ENCOUNTER — Encounter (HOSPITAL_COMMUNITY): Payer: Medicare Other | Admitting: Anesthesiology

## 2013-08-05 ENCOUNTER — Inpatient Hospital Stay (HOSPITAL_COMMUNITY): Payer: Medicare Other | Admitting: Anesthesiology

## 2013-08-05 DIAGNOSIS — M069 Rheumatoid arthritis, unspecified: Secondary | ICD-10-CM | POA: Diagnosis present

## 2013-08-05 DIAGNOSIS — Z472 Encounter for removal of internal fixation device: Secondary | ICD-10-CM

## 2013-08-05 DIAGNOSIS — Z7982 Long term (current) use of aspirin: Secondary | ICD-10-CM

## 2013-08-05 DIAGNOSIS — K5190 Ulcerative colitis, unspecified, without complications: Secondary | ICD-10-CM | POA: Diagnosis not present

## 2013-08-05 DIAGNOSIS — IMO0002 Reserved for concepts with insufficient information to code with codable children: Secondary | ICD-10-CM | POA: Diagnosis present

## 2013-08-05 DIAGNOSIS — K5900 Constipation, unspecified: Secondary | ICD-10-CM | POA: Diagnosis present

## 2013-08-05 DIAGNOSIS — M47817 Spondylosis without myelopathy or radiculopathy, lumbosacral region: Secondary | ICD-10-CM | POA: Diagnosis present

## 2013-08-05 DIAGNOSIS — Q762 Congenital spondylolisthesis: Principal | ICD-10-CM

## 2013-08-05 DIAGNOSIS — M48062 Spinal stenosis, lumbar region with neurogenic claudication: Secondary | ICD-10-CM | POA: Diagnosis present

## 2013-08-05 HISTORY — PX: POSTERIOR LUMBAR FUSION: SHX6036

## 2013-08-05 SURGERY — POSTERIOR LUMBAR FUSION 1 LEVEL
Anesthesia: General | Site: Back

## 2013-08-05 MED ORDER — THROMBIN 20000 UNITS EX SOLR
CUTANEOUS | Status: DC | PRN
  Administered 2013-08-05: 13:00:00 via TOPICAL

## 2013-08-05 MED ORDER — SODIUM CHLORIDE 0.9 % IJ SOLN
3.0000 mL | INTRAMUSCULAR | Status: DC | PRN
  Administered 2013-08-09: 3 mL via INTRAVENOUS

## 2013-08-05 MED ORDER — GLYCOPYRROLATE 0.2 MG/ML IJ SOLN
INTRAMUSCULAR | Status: AC
  Filled 2013-08-05: qty 3

## 2013-08-05 MED ORDER — BALSALAZIDE DISODIUM 750 MG PO CAPS
2250.0000 mg | ORAL_CAPSULE | Freq: Three times a day (TID) | ORAL | Status: DC
  Administered 2013-08-06 – 2013-08-09 (×9): 2250 mg via ORAL

## 2013-08-05 MED ORDER — HYDROMORPHONE HCL PF 1 MG/ML IJ SOLN
INTRAMUSCULAR | Status: AC
  Administered 2013-08-05: 0.5 mg via INTRAVENOUS
  Filled 2013-08-05: qty 1

## 2013-08-05 MED ORDER — THROMBIN 5000 UNITS EX SOLR
OROMUCOSAL | Status: DC | PRN
  Administered 2013-08-05: 13:00:00 via TOPICAL

## 2013-08-05 MED ORDER — ROCURONIUM BROMIDE 100 MG/10ML IV SOLN
INTRAVENOUS | Status: DC | PRN
  Administered 2013-08-05 (×3): 10 mg via INTRAVENOUS
  Administered 2013-08-05: 30 mg via INTRAVENOUS
  Administered 2013-08-05: 50 mg via INTRAVENOUS

## 2013-08-05 MED ORDER — PREDNISONE 5 MG PO TABS
5.0000 mg | ORAL_TABLET | Freq: Every day | ORAL | Status: DC
  Administered 2013-08-06 – 2013-08-09 (×4): 5 mg via ORAL
  Filled 2013-08-05 (×5): qty 1

## 2013-08-05 MED ORDER — OXYCODONE HCL 5 MG PO TABS
ORAL_TABLET | ORAL | Status: AC
  Filled 2013-08-05: qty 1

## 2013-08-05 MED ORDER — POLYETHYLENE GLYCOL 3350 17 G PO PACK
17.0000 g | PACK | Freq: Every day | ORAL | Status: DC | PRN
Start: 2013-08-05 — End: 2013-08-09
  Filled 2013-08-05: qty 1

## 2013-08-05 MED ORDER — FENTANYL CITRATE 0.05 MG/ML IJ SOLN
INTRAMUSCULAR | Status: AC
  Filled 2013-08-05: qty 5

## 2013-08-05 MED ORDER — ROCURONIUM BROMIDE 50 MG/5ML IV SOLN
INTRAVENOUS | Status: AC
  Filled 2013-08-05: qty 1

## 2013-08-05 MED ORDER — DOCUSATE SODIUM 100 MG PO CAPS
100.0000 mg | ORAL_CAPSULE | Freq: Two times a day (BID) | ORAL | Status: DC
  Administered 2013-08-05 – 2013-08-09 (×8): 100 mg via ORAL
  Filled 2013-08-05 (×7): qty 1

## 2013-08-05 MED ORDER — ESTRADIOL 1 MG PO TABS
0.5000 mg | ORAL_TABLET | Freq: Every day | ORAL | Status: DC
  Administered 2013-08-06 – 2013-08-09 (×4): 0.5 mg via ORAL
  Filled 2013-08-05 (×4): qty 0.5

## 2013-08-05 MED ORDER — OXYCODONE HCL 5 MG PO TABS
ORAL_TABLET | ORAL | Status: AC
  Administered 2013-08-05: 5 mg via ORAL
  Filled 2013-08-05: qty 1

## 2013-08-05 MED ORDER — SENNA 8.6 MG PO TABS
1.0000 | ORAL_TABLET | Freq: Two times a day (BID) | ORAL | Status: DC
  Administered 2013-08-05 – 2013-08-09 (×8): 8.6 mg via ORAL
  Filled 2013-08-05 (×9): qty 1

## 2013-08-05 MED ORDER — NEOSTIGMINE METHYLSULFATE 10 MG/10ML IV SOLN
INTRAVENOUS | Status: AC
  Filled 2013-08-05: qty 1

## 2013-08-05 MED ORDER — BISACODYL 10 MG RE SUPP
10.0000 mg | Freq: Every day | RECTAL | Status: DC | PRN
  Administered 2013-08-07: 10 mg via RECTAL
  Filled 2013-08-05: qty 1

## 2013-08-05 MED ORDER — ONDANSETRON HCL 4 MG/2ML IJ SOLN
INTRAMUSCULAR | Status: AC
  Filled 2013-08-05: qty 2

## 2013-08-05 MED ORDER — LACTATED RINGERS IV SOLN
INTRAVENOUS | Status: DC
  Administered 2013-08-05 (×2): via INTRAVENOUS

## 2013-08-05 MED ORDER — LEVOTHYROXINE SODIUM 88 MCG PO TABS
88.0000 ug | ORAL_TABLET | Freq: Every day | ORAL | Status: DC
  Administered 2013-08-06 – 2013-08-09 (×4): 88 ug via ORAL
  Filled 2013-08-05 (×5): qty 1

## 2013-08-05 MED ORDER — NEOSTIGMINE METHYLSULFATE 10 MG/10ML IV SOLN
INTRAVENOUS | Status: DC | PRN
  Administered 2013-08-05: 3 mg via INTRAVENOUS

## 2013-08-05 MED ORDER — FLEET ENEMA 7-19 GM/118ML RE ENEM: 1.0000 | ENEMA | Freq: Once | RECTAL | Status: AC | PRN

## 2013-08-05 MED ORDER — SODIUM CHLORIDE 0.9 % IV SOLN
250.0000 mL | INTRAVENOUS | Status: DC
  Administered 2013-08-05: 250 mL via INTRAVENOUS

## 2013-08-05 MED ORDER — MEDROXYPROGESTERONE ACETATE 2.5 MG PO TABS
2.5000 mg | ORAL_TABLET | Freq: Every day | ORAL | Status: DC
  Administered 2013-08-05 – 2013-08-08 (×4): 2.5 mg via ORAL
  Filled 2013-08-05 (×5): qty 1

## 2013-08-05 MED ORDER — PROPOFOL 10 MG/ML IV BOLUS
INTRAVENOUS | Status: DC | PRN
  Administered 2013-08-05: 150 mg via INTRAVENOUS

## 2013-08-05 MED ORDER — ONDANSETRON HCL 4 MG/2ML IJ SOLN: 4.0000 mg | Freq: Four times a day (QID) | INTRAMUSCULAR | Status: DC | PRN

## 2013-08-05 MED ORDER — FENTANYL CITRATE 0.05 MG/ML IJ SOLN
INTRAMUSCULAR | Status: DC | PRN
  Administered 2013-08-05 (×5): 50 ug via INTRAVENOUS
  Administered 2013-08-05: 150 ug via INTRAVENOUS
  Administered 2013-08-05 (×2): 50 ug via INTRAVENOUS

## 2013-08-05 MED ORDER — LATANOPROST 0.005 % OP SOLN
1.0000 [drp] | Freq: Every day | OPHTHALMIC | Status: DC
  Administered 2013-08-05 – 2013-08-08 (×4): 1 [drp] via OPHTHALMIC
  Filled 2013-08-05: qty 2.5

## 2013-08-05 MED ORDER — LISINOPRIL 5 MG PO TABS
5.0000 mg | ORAL_TABLET | Freq: Every day | ORAL | Status: DC
Start: 2013-08-05 — End: 2013-08-09
  Administered 2013-08-05 – 2013-08-09 (×5): 5 mg via ORAL
  Filled 2013-08-05 (×5): qty 1

## 2013-08-05 MED ORDER — DEXTROSE 5 % IV SOLN
500.0000 mg | Freq: Four times a day (QID) | INTRAVENOUS | Status: DC | PRN
  Filled 2013-08-05: qty 5

## 2013-08-05 MED ORDER — PHENOL 1.4 % MT LIQD: 1.0000 | OROMUCOSAL | Status: DC | PRN

## 2013-08-05 MED ORDER — METHOCARBAMOL 500 MG PO TABS
500.0000 mg | ORAL_TABLET | Freq: Four times a day (QID) | ORAL | Status: DC | PRN
  Filled 2013-08-05: qty 1

## 2013-08-05 MED ORDER — SODIUM CHLORIDE 0.9 % IV SOLN
INTRAVENOUS | Status: DC
  Administered 2013-08-06: 08:00:00 via INTRAVENOUS

## 2013-08-05 MED ORDER — MORPHINE SULFATE 2 MG/ML IJ SOLN
1.0000 mg | INTRAMUSCULAR | Status: DC | PRN
  Administered 2013-08-05 – 2013-08-06 (×3): 2 mg via INTRAVENOUS
  Filled 2013-08-05: qty 2
  Filled 2013-08-05 (×2): qty 1

## 2013-08-05 MED ORDER — PROMETHAZINE HCL 25 MG/ML IJ SOLN: 6.2500 mg | INTRAMUSCULAR | Status: DC | PRN

## 2013-08-05 MED ORDER — CEFAZOLIN SODIUM 1-5 GM-% IV SOLN
1.0000 g | Freq: Three times a day (TID) | INTRAVENOUS | Status: AC
  Administered 2013-08-06 (×2): 1 g via INTRAVENOUS
  Filled 2013-08-05 (×2): qty 50

## 2013-08-05 MED ORDER — DIAZEPAM 5 MG PO TABS
ORAL_TABLET | ORAL | Status: AC
Start: 2013-08-05 — End: 2013-08-05
  Administered 2013-08-05: 5 mg via ORAL
  Filled 2013-08-05: qty 1

## 2013-08-05 MED ORDER — LIDOCAINE HCL (CARDIAC) 20 MG/ML IV SOLN
INTRAVENOUS | Status: AC
  Filled 2013-08-05: qty 5

## 2013-08-05 MED ORDER — OXYCODONE-ACETAMINOPHEN 5-325 MG PO TABS
1.0000 | ORAL_TABLET | Freq: Four times a day (QID) | ORAL | Status: DC | PRN
  Administered 2013-08-06 – 2013-08-09 (×10): 1 via ORAL
  Filled 2013-08-05 (×10): qty 1

## 2013-08-05 MED ORDER — OXYCODONE HCL 5 MG PO TABS
5.0000 mg | ORAL_TABLET | Freq: Once | ORAL | Status: AC | PRN
  Administered 2013-08-05: 5 mg via ORAL

## 2013-08-05 MED ORDER — KETOROLAC TROMETHAMINE 30 MG/ML IJ SOLN
INTRAMUSCULAR | Status: AC
  Administered 2013-08-05: 15 mg
  Filled 2013-08-05: qty 1

## 2013-08-05 MED ORDER — HYDROCODONE-ACETAMINOPHEN 5-325 MG PO TABS
1.0000 | ORAL_TABLET | Freq: Four times a day (QID) | ORAL | Status: DC | PRN
  Administered 2013-08-07 – 2013-08-08 (×2): 1 via ORAL
  Filled 2013-08-05 (×2): qty 1

## 2013-08-05 MED ORDER — SODIUM CHLORIDE 0.9 % IJ SOLN
3.0000 mL | Freq: Two times a day (BID) | INTRAMUSCULAR | Status: DC
  Administered 2013-08-05 – 2013-08-08 (×5): 3 mL via INTRAVENOUS

## 2013-08-05 MED ORDER — SODIUM CHLORIDE 0.9 % IR SOLN
Status: DC | PRN
  Administered 2013-08-05: 13:00:00

## 2013-08-05 MED ORDER — BUPIVACAINE HCL (PF) 0.5 % IJ SOLN
INTRAMUSCULAR | Status: DC | PRN
  Administered 2013-08-05: 5 mL
  Administered 2013-08-05: 20 mL

## 2013-08-05 MED ORDER — ACETAMINOPHEN 325 MG PO TABS
650.0000 mg | ORAL_TABLET | ORAL | Status: DC | PRN
  Filled 2013-08-05: qty 2

## 2013-08-05 MED ORDER — KETOROLAC TROMETHAMINE 15 MG/ML IJ SOLN: 15.0000 mg | Freq: Once | INTRAMUSCULAR | Status: AC

## 2013-08-05 MED ORDER — ALUM & MAG HYDROXIDE-SIMETH 200-200-20 MG/5ML PO SUSP
30.0000 mL | Freq: Four times a day (QID) | ORAL | Status: DC | PRN
  Administered 2013-08-08: 30 mL via ORAL
  Filled 2013-08-05: qty 30

## 2013-08-05 MED ORDER — GLYCOPYRROLATE 0.2 MG/ML IJ SOLN
INTRAMUSCULAR | Status: DC | PRN
  Administered 2013-08-05: 0.6 mg via INTRAVENOUS

## 2013-08-05 MED ORDER — MENTHOL 3 MG MT LOZG
1.0000 | LOZENGE | OROMUCOSAL | Status: DC | PRN
  Administered 2013-08-06: 3 mg via ORAL
  Filled 2013-08-05: qty 9

## 2013-08-05 MED ORDER — LIDOCAINE-EPINEPHRINE 1 %-1:100000 IJ SOLN
INTRAMUSCULAR | Status: DC | PRN
  Administered 2013-08-05: 5 mL

## 2013-08-05 MED ORDER — HYDROMORPHONE HCL PF 1 MG/ML IJ SOLN
INTRAMUSCULAR | Status: AC
  Filled 2013-08-05: qty 1

## 2013-08-05 MED ORDER — 0.9 % SODIUM CHLORIDE (POUR BTL) OPTIME
TOPICAL | Status: DC | PRN
  Administered 2013-08-05: 1000 mL

## 2013-08-05 MED ORDER — KETOROLAC TROMETHAMINE 15 MG/ML IJ SOLN
15.0000 mg | Freq: Four times a day (QID) | INTRAMUSCULAR | Status: AC
  Administered 2013-08-06 (×5): 15 mg via INTRAVENOUS
  Filled 2013-08-05 (×8): qty 1

## 2013-08-05 MED ORDER — HYDROMORPHONE HCL PF 1 MG/ML IJ SOLN
0.2500 mg | INTRAMUSCULAR | Status: DC | PRN
  Administered 2013-08-05 (×3): 0.5 mg via INTRAVENOUS

## 2013-08-05 MED ORDER — ONDANSETRON HCL 4 MG/2ML IJ SOLN: 4.0000 mg | INTRAMUSCULAR | Status: DC | PRN

## 2013-08-05 MED ORDER — GUAIFENESIN ER 600 MG PO TB12
1200.0000 mg | ORAL_TABLET | Freq: Every day | ORAL | Status: DC | PRN
  Filled 2013-08-05: qty 2

## 2013-08-05 MED ORDER — ONDANSETRON HCL 4 MG/2ML IJ SOLN
INTRAMUSCULAR | Status: DC | PRN
  Administered 2013-08-05: 4 mg via INTRAVENOUS

## 2013-08-05 MED ORDER — DIAZEPAM 5 MG PO TABS
5.0000 mg | ORAL_TABLET | Freq: Four times a day (QID) | ORAL | Status: DC | PRN
  Administered 2013-08-05 – 2013-08-09 (×10): 5 mg via ORAL
  Filled 2013-08-05 (×11): qty 1

## 2013-08-05 MED ORDER — LIDOCAINE HCL (CARDIAC) 20 MG/ML IV SOLN
INTRAVENOUS | Status: DC | PRN
  Administered 2013-08-05: 40 mg via INTRAVENOUS

## 2013-08-05 MED ORDER — OXYCODONE HCL 5 MG/5ML PO SOLN: 5.0000 mg | Freq: Once | ORAL | Status: AC | PRN

## 2013-08-05 MED ORDER — PROPOFOL 10 MG/ML IV BOLUS
INTRAVENOUS | Status: AC
  Filled 2013-08-05: qty 20

## 2013-08-05 MED ORDER — ACETAMINOPHEN 650 MG RE SUPP
650.0000 mg | RECTAL | Status: DC | PRN
  Filled 2013-08-05: qty 1

## 2013-08-05 MED ORDER — HYDROCHLOROTHIAZIDE 25 MG PO TABS
25.0000 mg | ORAL_TABLET | Freq: Every day | ORAL | Status: DC
  Administered 2013-08-06 – 2013-08-09 (×4): 25 mg via ORAL
  Filled 2013-08-05 (×5): qty 1

## 2013-08-05 SURGICAL SUPPLY — 74 items
ADH SKN CLS APL DERMABOND .7 (GAUZE/BANDAGES/DRESSINGS) ×1
ADH SKN CLS LQ APL DERMABOND (GAUZE/BANDAGES/DRESSINGS) ×1
BAG DECANTER FOR FLEXI CONT (MISCELLANEOUS) ×3 IMPLANT
BLADE 10 SAFETY STRL DISP (BLADE) ×1 IMPLANT
BLADE SURG ROTATE 9660 (MISCELLANEOUS) IMPLANT
BONE MATRIX OSTEOCEL PRO MED (Bone Implant) ×4 IMPLANT
BUR MATCHSTICK NEURO 3.0 LAGG (BURR) ×3 IMPLANT
CAGE COROENT MP 8X9X23M-8 SPIN (Cage) ×4 IMPLANT
CANISTER SUCT 3000ML (MISCELLANEOUS) ×3 IMPLANT
CONT SPEC 4OZ CLIKSEAL STRL BL (MISCELLANEOUS) ×6 IMPLANT
COVER BACK TABLE 24X17X13 BIG (DRAPES) IMPLANT
COVER TABLE BACK 60X90 (DRAPES) ×3 IMPLANT
DECANTER SPIKE VIAL GLASS SM (MISCELLANEOUS) ×3 IMPLANT
DERMABOND ADHESIVE PROPEN (GAUZE/BANDAGES/DRESSINGS) ×2
DERMABOND ADVANCED (GAUZE/BANDAGES/DRESSINGS) ×2
DERMABOND ADVANCED .7 DNX12 (GAUZE/BANDAGES/DRESSINGS) ×1 IMPLANT
DERMABOND ADVANCED .7 DNX6 (GAUZE/BANDAGES/DRESSINGS) IMPLANT
DRAPE C-ARM 42X72 X-RAY (DRAPES) ×6 IMPLANT
DRAPE LAPAROTOMY 100X72X124 (DRAPES) ×3 IMPLANT
DRAPE POUCH INSTRU U-SHP 10X18 (DRAPES) ×3 IMPLANT
DRAPE PROXIMA HALF (DRAPES) IMPLANT
DRSG OPSITE POSTOP 4X10 (GAUZE/BANDAGES/DRESSINGS) ×2 IMPLANT
DRSG TEGADERM 4X4.75 (GAUZE/BANDAGES/DRESSINGS) ×2 IMPLANT
DURAPREP 26ML APPLICATOR (WOUND CARE) ×3 IMPLANT
ELECT REM PT RETURN 9FT ADLT (ELECTROSURGICAL) ×3
ELECTRODE REM PT RTRN 9FT ADLT (ELECTROSURGICAL) ×1 IMPLANT
GAUZE SPONGE 4X4 16PLY XRAY LF (GAUZE/BANDAGES/DRESSINGS) IMPLANT
GLOVE BIOGEL PI IND STRL 7.5 (GLOVE) IMPLANT
GLOVE BIOGEL PI IND STRL 8.5 (GLOVE) ×2 IMPLANT
GLOVE BIOGEL PI INDICATOR 7.5 (GLOVE) ×4
GLOVE BIOGEL PI INDICATOR 8.5 (GLOVE) ×4
GLOVE ECLIPSE 7.5 STRL STRAW (GLOVE) ×4 IMPLANT
GLOVE ECLIPSE 8.0 STRL XLNG CF (GLOVE) ×2 IMPLANT
GLOVE ECLIPSE 8.5 STRL (GLOVE) ×8 IMPLANT
GLOVE EXAM NITRILE LRG STRL (GLOVE) IMPLANT
GLOVE EXAM NITRILE MD LF STRL (GLOVE) IMPLANT
GLOVE EXAM NITRILE XL STR (GLOVE) IMPLANT
GLOVE EXAM NITRILE XS STR PU (GLOVE) IMPLANT
GLOVE SS N UNI LF 7.0 STRL (GLOVE) ×8 IMPLANT
GOWN STRL REUS W/ TWL LRG LVL3 (GOWN DISPOSABLE) IMPLANT
GOWN STRL REUS W/ TWL XL LVL3 (GOWN DISPOSABLE) IMPLANT
GOWN STRL REUS W/TWL 2XL LVL3 (GOWN DISPOSABLE) ×6 IMPLANT
GOWN STRL REUS W/TWL LRG LVL3 (GOWN DISPOSABLE) ×3
GOWN STRL REUS W/TWL XL LVL3 (GOWN DISPOSABLE) ×6
HEMOSTAT POWDER KIT SURGIFOAM (HEMOSTASIS) IMPLANT
KIT BASIN OR (CUSTOM PROCEDURE TRAY) ×3 IMPLANT
KIT ROOM TURNOVER OR (KITS) ×3 IMPLANT
MILL MEDIUM DISP (BLADE) ×2 IMPLANT
NEEDLE HYPO 22GX1.5 SAFETY (NEEDLE) ×3 IMPLANT
NS IRRIG 1000ML POUR BTL (IV SOLUTION) ×3 IMPLANT
PACK LAMINECTOMY NEURO (CUSTOM PROCEDURE TRAY) ×3 IMPLANT
PAD ARMBOARD 7.5X6 YLW CONV (MISCELLANEOUS) ×9 IMPLANT
PATTIES SURGICAL .5 X1 (DISPOSABLE) ×3 IMPLANT
ROD PREBENT 60MM (Rod) ×2 IMPLANT
ROD PREBENT ARM15T 55MM (Rod) ×2 IMPLANT
SCREW LOCK (Screw) ×18 IMPLANT
SCREW LOCK 100X5.5X OPN (Screw) IMPLANT
SCREW POLY 45X6.5 (Screw) IMPLANT
SCREW POLY 6.5X45MM (Screw) ×18 IMPLANT
SPECIMEN JAR SMALL (MISCELLANEOUS) ×2 IMPLANT
SPONGE GAUZE 4X4 12PLY (GAUZE/BANDAGES/DRESSINGS) ×3 IMPLANT
SPONGE LAP 4X18 X RAY DECT (DISPOSABLE) IMPLANT
SPONGE SURGIFOAM ABS GEL 100 (HEMOSTASIS) ×3 IMPLANT
SUT VIC AB 1 CT1 18XBRD ANBCTR (SUTURE) ×1 IMPLANT
SUT VIC AB 1 CT1 8-18 (SUTURE) ×6
SUT VIC AB 2-0 CP2 18 (SUTURE) ×5 IMPLANT
SUT VIC AB 3-0 SH 8-18 (SUTURE) ×5 IMPLANT
SYR 20ML ECCENTRIC (SYRINGE) ×3 IMPLANT
SYR 3ML LL SCALE MARK (SYRINGE) ×12 IMPLANT
TOWEL OR 17X24 6PK STRL BLUE (TOWEL DISPOSABLE) ×3 IMPLANT
TOWEL OR 17X26 10 PK STRL BLUE (TOWEL DISPOSABLE) ×3 IMPLANT
TRAP SPECIMEN MUCOUS 40CC (MISCELLANEOUS) ×3 IMPLANT
TRAY FOLEY CATH 14FRSI W/METER (CATHETERS) ×3 IMPLANT
WATER STERILE IRR 1000ML POUR (IV SOLUTION) ×3 IMPLANT

## 2013-08-05 NOTE — Transfer of Care (Signed)
Immediate Anesthesia Transfer of Care Note  [name]  Procedure(s) Performed: Procedure(s): POSTERIOR LUMBAR FUSION 1 LEVEL, lumbar one/two (N/A)  Patient Location: PACU  Anesthesia Type:General  Level of Consciousness: awake, alert , oriented and lethargic  Airway & Oxygen Therapy: Patient Spontanous Breathing and Patient connected to nasal cannula oxygen  Post-op Assessment: Report given to PACU RN and Post -op Vital signs reviewed and stable  Post vital signs: Reviewed and stable  Complications: No apparent anesthesia complications

## 2013-08-05 NOTE — Progress Notes (Signed)
Patient ID: [name], female   DOB: 10/07/1934, 79 y.o.   MRN: [id number] Alert complaining of centralized back pain Motor function appears intact in lower extremities, dressing is dry Spoke with husband by phone to inform that everything went well We'll add Toradol for pain control

## 2013-08-05 NOTE — Anesthesia Procedure Notes (Signed)
Procedure Name: Intubation Date/Time: 08/05/2013 12:29 PM Performed by: Rush Farmer E Pre-anesthesia Checklist: Patient identified, Emergency Drugs available, Suction available, Patient being monitored and Timeout performed Patient Re-evaluated:Patient Re-evaluated prior to inductionOxygen Delivery Method: Circle system utilized Preoxygenation: Pre-oxygenation with 100% oxygen Intubation Type: IV induction Ventilation: Mask ventilation without difficulty Laryngoscope Size: Mac and 3 Grade View: Grade I Tube type: Oral Tube size: 7.0 mm Number of attempts: 1 Airway Equipment and Method: Stylet Placement Confirmation: ETT inserted through vocal cords under direct vision,  positive ETCO2 and breath sounds checked- equal and bilateral Secured at: 21 cm Tube secured with: Tape Dental Injury: Teeth and Oropharynx as per pre-operative assessment

## 2013-08-05 NOTE — Progress Notes (Signed)
Both hearing aids placed in pts ears. Case with pt

## 2013-08-05 NOTE — Anesthesia Preprocedure Evaluation (Addendum)
Anesthesia Evaluation  Patient identified by MRN, date of birth, ID band Patient awake    Reviewed: Allergy & Precautions, H&P , NPO status , Patient's Chart, lab work & pertinent test results  History of Anesthesia Complications Negative for: history of anesthetic complications  Airway Mallampati: II TM Distance: >3 FB Neck ROM: Full    Dental  (+) Teeth Intact, Dental Advisory Given, Caps   Pulmonary neg pulmonary ROS,    Pulmonary exam normal       Cardiovascular hypertension, Pt. on medications     Neuro/Psych PSYCHIATRIC DISORDERS Anxiety negative neurological ROS     GI/Hepatic Neg liver ROS, Ulcerative colitis   Endo/Other  Hypothyroidism   Renal/GU negative Renal ROS     Musculoskeletal  (+) Arthritis -, Rheumatoid disorders,    Abdominal   Peds  Hematology   Anesthesia Other Findings   Reproductive/Obstetrics                          Anesthesia Physical Anesthesia Plan  ASA: III  Anesthesia Plan: General   Post-op Pain Management:    Induction: Intravenous  Airway Management Planned: Oral ETT  Additional Equipment:   Intra-op Plan:   Post-operative Plan: Extubation in OR  Informed Consent: I have reviewed the patients History and Physical, chart, labs and discussed the procedure including the risks, benefits and alternatives for the proposed anesthesia with the patient or authorized representative who has indicated his/her understanding and acceptance.   Dental advisory given  Plan Discussed with: CRNA, Anesthesiologist and Surgeon  Anesthesia Plan Comments:         Anesthesia Quick Evaluation

## 2013-08-05 NOTE — Op Note (Signed)
Date of surgery: 08/05/2013 Preoperative diagnosis: L1-2 spondylolisthesis and herniated nucleus pulposus with severe stenosis, lumbar radiculopathy. Postoperative diagnosis L1-L2 spondylolisthesis and herniated nucleus pulposus with severe stenosis and lumbar radiculopathy Procedure: Decompression L1-L2 with more work than require for simple posterior interbody technique decompression of central canal and the L1 and L2 nerve roots individually secondary to severe stenosis and large ruptured disc behind the vertebral body of L2 posterior lumbar interbody arthrodesis using peek spacers local autograft and allograft revision of pedicle screw fixation with removal of hardware from L2-L5 and placement of hardware from L1-L3. Posterior lateral arthrodesis L1 and L3 Surgeon: [name] Assistant: Albertina Parr Anesthesia: Gen. endotracheal Indications: Kyrsten is a 79 year old individual who a number of years ago had undergone decompression and fusion from L3-L5 she did well for several years but then 2 years ago required decompression and fusion at L2-L3 secondary to advancing spondylosis L1 and L2 appeared fairly normal at that time however since that time she's developed a large extruded fragment of disc behind the vertebral body of L2 that causes a complete myelographic block in addition she's got been advanced degenerative changes in the disc space with a 3 mm retrolisthesis at the L1-L2 level. Having failed efforts at conservative management she was advised regarding the need for surgery to decompress and stabilize the L1-L2 joint.  Procedure: The patient was brought to the operating room supine on a stretcher. After the smooth induction of general endotracheal anesthesia, she was turned prone. The bony prominences were appropriately padded and protected. The back was prepped with alcohol and DuraPrep. The was draped in a sterile fashion. A midline incision was created through the previous scar. This was  carried down to the lumbodorsal fascia, this was opened on either side of the midline. The old hardware was exposed. The construct that was used in the fusion of L2-L3 used a side connector with medially to laterally place pedicle screws. These were removed along with the side connectors and then the screw of all the screws including a transverse connector at the L4-5 level was removed. The old hardware was then removed sequentially is felt a new hardware would best be placed only at the levels involved mainly L1-L2 and L3 as L2-3 fusion was fairly nascent.  Once the hardware was removed a laminectomy was taken to completion at the L2 vertebrae and and large laminotomies removing the inferior margin laminar arch up to and including the entirety of the facet joint at the L1-L2 level was performed. The yellow ligament was thickened in this region and removal of it to allow for good decompression of the common dural tube centrally then laminotomies and foraminotomies were created out over the nerve roots from L1 superiorly and L2 inferiorly is noted be a substantial mass under the vertebral body of L2 the cause severe stenosis for the common dural tube and the L2 nerve root this required careful dissection as it was rather fibrotic and quite attached to the dura itself in this region however I removed in a piecemeal fashion a number of disc fragments until we achieved a decompression to the vertebral body endplate. The disc space was then identified was noted to be severely sclerotic and retrolisthesed.  The interspace was opened and self-retaining distractor was placed into the interspace. A series of shavers were used to open the interspace to the 8 mm size. The interspace would not allow to be distracted further. Then with proper sizing and 8 23 mm long 8 mm tall spacer  was placed into the interspace this was placed after trial was used to size the appropriate size and checked radiographically. In the end we  placed 2 spacers packed with autograft and allograft that was mixed with Osteo cell. The interspace was filled with the autograft and osseous cell combination. Care was taken to make sure that the L1 nerve root superiorly and the L2 nerve root inferiorly were well decompressed. This was done bilaterally. Pedicle entry sites were then chosen at L1 and L2. The L2 screws use a different trajectory projecting lateral to medial into the vertebral body. 6.5 x 45 mm screws were placed into L1-L2 and the L3 screws were replaced with similar screws. A 55 mm connecting rod was then placed on the left side and on the right side a 60 mm connecting rod was used. The construct was tightened in a neutral position. Radiographic confirmation was then obtained showing good alignment in both the coronal and sagittal planes. The lateral gutters which had previously been decorticated were then packed with the remainder of autograft and allograft connecting L1-L3. With this the condition and decompression of the nerve roots was checked at L1-L2 and L3 and this was verified to be good hemostasis was well achieved the lumbar dorsal fascia was closed with #1 Vicryl, 2-0 Vicryl was used in the subcutaneous tissues, 3-0 Vicryl was used to close subcuticular skin. Dr. Hal Neer helped with key portions of the case particularly placement of the graft and alignment of the hardware in a neutral position.

## 2013-08-05 NOTE — H&P (Addendum)
[name] is an 79 y.o. female.   Chief Complaint: Back pain bilateral leg pain and weakness HPI: [name] is a 79 year old individual who has had previous spondylitic degeneration at L3-4 and L4-5 more recently at L2-3 now she has developed new disease at L1 L2. She has severe stenosis with a retrolisthesis and she's been advised regarding the need for surgical decompression and stabilization of this joint. She is now being in mid for the surgery  Past Medical History  Diagnosis Date  . Ulcerative colitis   . Hypothyroidism   . Hypertension     pcp   dr reed   [facility]  . Pneumonia 2008  . Chronic bronchitis 10/31/2011    "prone to it; I have sinus/bronchitis problem probably q yr"  . Multiple skin tears 10/31/2011    "get them very easily; my skin is thin and I bruise easily"  . Chronic back pain     "my entire spine"  . Edema   . Other and unspecified hyperlipidemia   . Encounter for long-term (current) use of other medications   . Senile osteoporosis   . Spinal stenosis, unspecified region other than cervical   . Unspecified glaucoma   . Hypothyroidism   . Complication of anesthesia 2009    htn with block- for hand surgery   . Anxiety     sometimes   . Rheumatoid arthritis(714.0)     degenerative disc disease     Past Surgical History  Procedure Laterality Date  . Back surgery    . Sinus surgery with instatrak    . Thyroidectomy, partial  1997  . Posterior fusion lumbar spine  10/31/2011    L2-3; 3-4  . Tonsillectomy      "when I was a small child"  . Functional endoscopic sinus surgery  1988  . Posterior fusion lumbar spine  11/2009    L3-4;  L4-5  . Joint replacement  2009; 2006    joints 2 fingers,rt hand; joint left thumb    Family History  Problem Relation Age of Onset  . Heart disease Mother   . Cancer Father     lung   Social History:  reports that she has been passively smoking Cigarettes.  She has a 12.5 pack-year smoking history. She  has never used smokeless tobacco. She reports that she drinks about 2.4 ounces of alcohol per week. She reports that she does not use illicit drugs.  Allergies: No Known Allergies  Medications Prior to Admission  Medication Sig Dispense Refill  . adalimumab (HUMIRA) 40 MG/0.8ML injection Inject 0.8 mLs (40 mg total) into the skin every 14 (fourteen) days. Resume ONLY if off all antibiotics  2 each  0  . aspirin EC 81 MG tablet Take 81 mg by mouth every other day.      . balsalazide (COLAZAL) 750 MG capsule Take 2,250 mg by mouth 3 (three) times daily.      . Calcium Carb-Cholecalciferol (CALCIUM + D3) 600-200 MG-UNIT TABS Take 1 tablet by mouth daily.      . CELEBREX 200 MG capsule Take 400 mg by mouth daily.       . Cholecalciferol (VITAMIN D3) 1000 UNITS CAPS Take 1,000 Units by mouth daily.      . diazepam (VALIUM) 5 MG tablet Take 1 tablet (5 mg total) by mouth every 6 (six) hours as needed. For muscle spasms.  15 tablet  0  . estradiol (ESTRACE) 0.5 MG tablet Take 0.5  mg by mouth daily.       . folic acid (FOLVITE) 1 MG tablet Take 2 mg by mouth daily.      Marland Kitchen guaiFENesin (MUCINEX) 600 MG 12 hr tablet Take 1,200 mg by mouth daily as needed (sinus relief).       . hydrochlorothiazide (HYDRODIURIL) 25 MG tablet Take 25 mg by mouth daily with breakfast.      . HYDROcodone-acetaminophen (NORCO/VICODIN) 5-325 MG per tablet Take 1 tablet by mouth every 6 (six) hours as needed (pain).       Marland Kitchen latanoprost (XALATAN) 0.005 % ophthalmic solution Place 1 drop into both eyes at bedtime.      Marland Kitchen leflunomide (ARAVA) 10 MG tablet Take 20 mg by mouth daily.       Marland Kitchen levothyroxine (SYNTHROID, LEVOTHROID) 88 MCG tablet Take 88 mcg by mouth daily before breakfast.      . lisinopril (PRINIVIL,ZESTRIL) 5 MG tablet Take 1 tablet (5 mg total) by mouth daily. For high blood pressure  30 tablet  3  . medroxyPROGESTERone (PROVERA) 2.5 MG tablet Take 2.5 mg by mouth at bedtime.       . Multiple Vitamin (MULTIVITAMIN  WITH MINERALS) TABS Take 1 tablet by mouth daily.      . Omega-3 Fatty Acids (FISH OIL) 1200 MG CAPS Take 1,200 mg by mouth daily.      Marland Kitchen oxyCODONE-acetaminophen (PERCOCET/ROXICET) 5-325 MG per tablet Take 1 tablet by mouth every 6 (six) hours as needed (pain).       . predniSONE (DELTASONE) 5 MG tablet Take 5 mg by mouth daily with breakfast.         No results found for this or any previous visit (from the past 48 hour(s)). No results found.  Review of Systems  HENT: Negative.   Eyes: Negative.   Respiratory: Negative.   Cardiovascular: Negative.   Genitourinary: Negative.   Musculoskeletal: Positive for back pain and joint pain.       History of rheumatoid arthritis  Skin: Negative.   Neurological: Positive for weakness.  Endo/Heme/Allergies: Negative.   Psychiatric/Behavioral: Negative.     Blood pressure 188/63, pulse 56, temperature 98 F (36.7 C), temperature source Oral, resp. rate 18, weight 60.782 kg (134 lb), SpO2 100.00%. Physical Exam  Constitutional: She is oriented to person, place, and time. She appears well-developed and well-nourished.  HENT:  Head: Atraumatic.  Eyes: Conjunctivae and EOM are normal. Pupils are equal, round, and reactive to light.  Neck: Normal range of motion. Neck supple.  Respiratory: Effort normal and breath sounds normal.  GI: Soft. Bowel sounds are normal.  Musculoskeletal:  Centralized back pain at either end of well-healed midline scar. A sequelae of long-standing rheumatoid arthritis particularly in distal upper extremities with marked deformity hands.  Neurological: She is oriented to person, place, and time. She has normal reflexes.  Weakness in tibialis anterior bilateral  at 4 minus out of 5  Skin: Skin is warm and dry.  Psychiatric: She has a normal mood and affect. Her behavior is normal. Judgment and thought content normal.     Assessment/Plan Spondylolisthesis and stenosis L1-L2 Surgical decompression L1-L2 arthrodesis  L2-L5  [name] 08/05/2013, 11:54 AM

## 2013-08-06 LAB — CBC
HEMATOCRIT: 31.5 % — AB (ref 36.0–46.0)
Hemoglobin: 10.5 g/dL — ABNORMAL LOW (ref 12.0–15.0)
MCH: 31 pg (ref 26.0–34.0)
MCHC: 33.3 g/dL (ref 30.0–36.0)
MCV: 92.9 fL (ref 78.0–100.0)
Platelets: 179 10*3/uL (ref 150–400)
RBC: 3.39 MIL/uL — ABNORMAL LOW (ref 3.87–5.11)
RDW: 14.5 % (ref 11.5–15.5)
WBC: 7.2 10*3/uL (ref 4.0–10.5)

## 2013-08-06 LAB — BASIC METABOLIC PANEL
BUN: 22 mg/dL (ref 6–23)
CHLORIDE: 103 meq/L (ref 96–112)
CO2: 24 mEq/L (ref 19–32)
Calcium: 8.1 mg/dL — ABNORMAL LOW (ref 8.4–10.5)
Creatinine, Ser: 0.81 mg/dL (ref 0.50–1.10)
GFR calc non Af Amer: 69 mL/min — ABNORMAL LOW (ref >90)
GFR, EST AFRICAN AMERICAN: 80 mL/min — AB (ref >90)
Glucose, Bld: 94 mg/dL (ref 70–99)
POTASSIUM: 3.4 meq/L — AB (ref 3.7–5.3)
SODIUM: 138 meq/L (ref 137–147)

## 2013-08-06 NOTE — Clinical Social Work Note (Addendum)
CSW consulted for possible SNF placement at time of discharge. CSW now following for PT/OT recommendations. Thank you for the referral.  Per chart review, pt to be discharged home with home health services. CSW signing off.   Pati Gallo, [place] Social Worker [phone]

## 2013-08-06 NOTE — Progress Notes (Signed)
Subjective: Patient reports back pain tolerable legs feel good. Incision is bleeding through.  Objective: Vital signs in last 24 hours: Temp:  [98.3 F (36.8 C)-98.9 F (37.2 C)] 98.4 F (36.9 C) (05/27 1838) Pulse Rate:  [63-77] 77 (05/27 1838) Resp:  [15-18] 18 (05/27 1838) BP: (106-129)/(48-72) 119/72 mmHg (05/27 1838) SpO2:  [97 %-100 %] 100 % (05/27 1838) Weight:  [63.141 kg (139 lb 3.2 oz)] 63.141 kg (139 lb 3.2 oz) (05/27 0800)  Intake/Output from previous day: 05/26 0701 - 05/27 0700 In: 1700 [P.O.:200; I.V.:1500] Out: 680 [Urine:430; Blood:250] Intake/Output this shift:    motor function intact. dressing with substantial drainage.  Lab Results:  Recent Labs  08/06/13 0500  WBC 7.2  HGB 10.5*  HCT 31.5*  PLT 179   BMET  Recent Labs  08/06/13 0500  NA 138  K 3.4*  CL 103  CO2 24  GLUCOSE 94  BUN 22  CREATININE 0.81  CALCIUM 8.1*    Studies/Results: Dg Lumbar Spine 2-3 Views  08/05/2013   CLINICAL DATA:  PLIF extension  EXAM: LUMBAR SPINE - 2-3 VIEW  COMPARISON:  CT 06/30/2013  FINDINGS: AP and lateral C-arm images show discectomy at L1-2 with placement of interbody fusion material. Pedicle screws are in place at L1, L2 and L3. Hardware has been removed from L4 and L5.  IMPRESSION: Discectomy and fusion extended to the L1-2 level.   Electronically Signed   By: Nelson Chimes M.D.   On: 08/05/2013 16:23    Assessment/Plan: Stable with substantial bleed through on incision.   LOS: 1 day  dressing changes and wound care until drainage slows.   [name] 08/06/2013, 8:18 PM

## 2013-08-06 NOTE — Evaluation (Signed)
Physical Therapy Evaluation Patient Details Name: [name] MRN: [id number] DOB: 01-07-1935 Today's Date: 08/06/2013   History of Present Illness  Admitted with spondylolisthesis, hnp and stenosis and L1-L2 levels, s/p decompression and fusion at L1-L3 with hardware.  removal of old hardward.  Clinical Impression  Pt admitted with/for lumbar fusion.  Pt currently limited functionally due to the problems listed below.  (see problems list.)  Pt will benefit from PT to maximize function and safety to be able to get home safely with available assist of husband.     Follow Up Recommendations Home health PT    Equipment Recommendations  None recommended by PT    Recommendations for Other Services       Precautions / Restrictions Precautions Precautions: Back;Fall Precaution Booklet Issued: Yes (comment) Required Braces or Orthoses: Spinal Brace Spinal Brace: Applied in sitting position;Lumbar corset      Mobility  Bed Mobility Overal bed mobility: Needs Assistance Bed Mobility: Rolling;Sidelying to Sit Rolling: Min assist Sidelying to sit: Min assist       General bed mobility comments: re educated on safe logroll technique.  Needed some truncal assist  Transfers Overall transfer level: Needs assistance Equipment used: Rolling walker (2 wheeled) Transfers: Sit to/from Stand Sit to Stand: Min guard;Min assist (depending on surface)         General transfer comment: cues for hand placement and safety  Ambulation/Gait Ambulation/Gait assistance: Min guard Ambulation Distance (Feet): 250 Feet Assistive device: Rolling walker (2 wheeled) Gait Pattern/deviations: Step-through pattern Gait velocity: slower Gait velocity interpretation: Below normal speed for age/gender General Gait Details: steady and slow.  cues for better use of RW.   Stairs            Wheelchair Mobility    Modified Rankin (Stroke Patients Only)       Balance Overall balance  assessment: No apparent balance deficits (not formally assessed)                                           Pertinent Vitals/Pain     Home Living Family/patient expects to be discharged to:: Private residence Living Arrangements: Spouse/significant other Available Help at Discharge: Family Type of Home: House Home Access: Stairs to enter Entrance Stairs-Rails: Psychiatric nurse of Steps: multiple Home Layout: Multi-level;Bed/bath upstairs Home Equipment: Environmental consultant - 2 wheels;Toilet riser;Shower seat      Prior Function Level of Independence: Independent               Hand Dominance        Extremity/Trunk Assessment   Upper Extremity Assessment: Defer to OT evaluation           Lower Extremity Assessment: Overall WFL for tasks assessed;LLE deficits/detail   LLE Deficits / Details: l leg weakness and with arthritic knee in need of surgery per pt.     Communication   Communication: No difficulties  Cognition Arousal/Alertness: Awake/alert Behavior During Therapy: WFL for tasks assessed/performed Overall Cognitive Status: Within Functional Limits for tasks assessed                      General Comments      Exercises        Assessment/Plan    PT Assessment Patient needs continued PT services  PT Diagnosis Acute pain;Generalized weakness   PT Problem List Decreased strength;Decreased activity tolerance;Decreased mobility;Decreased knowledge  of use of DME;Decreased knowledge of precautions;Pain  PT Treatment Interventions DME instruction;Gait training;Stair training;Functional mobility training;Therapeutic activities;Patient/family education   PT Goals (Current goals can be found in the Care Plan section) Acute Rehab PT Goals Patient Stated Goal: back to independence and get ready for other surgeries. PT Goal Formulation: With patient Time For Goal Achievement: 08/13/13 Potential to Achieve Goals: Good     Frequency Min 5X/week   Barriers to discharge        Co-evaluation               End of Session Equipment Utilized During Treatment: Back brace Activity Tolerance: Patient tolerated treatment well Patient left: in chair;with call bell/phone within reach Nurse Communication: Mobility status         Time: 1023-1106 PT Time Calculation (min): 43 min   Charges:   PT Evaluation $Initial PT Evaluation Tier I: 1 Procedure PT Treatments $Gait Training: 8-22 mins $Self Care/Home Management: 8-22   PT G Codes:          [name] V 08/06/2013, 11:28 AM 08/06/2013  Donnella Sham, PT [phone]  (pager)

## 2013-08-06 NOTE — Progress Notes (Signed)
Utilization review completed.  

## 2013-08-06 NOTE — Evaluation (Signed)
Occupational Therapy Evaluation Patient Details Name: [name] MRN: [id number] DOB: 07-22-1934 Today's Date: 08/06/2013    History of Present Illness Admitted with spondylolisthesis, hnp and stenosis and L1-L2 levels, s/p decompression and fusion at L1-L3 with hardware.  removal of old hardward.   Clinical Impression   Pt admitted with above. She demonstrates the below listed deficits and will benefit from continued OT to maximize safety and independence with BADLs.  Anticipate good progress as pt is very motivated.   No follow up OT recommended at discharge, and recommend 3in1commode     Follow Up Recommendations  No OT follow up;Supervision - Intermittent    Equipment Recommendations  3 in 1 bedside comode    Recommendations for Other Services       Precautions / Restrictions Precautions Precautions: Back;Fall Precaution Booklet Issued: Yes (comment) Required Braces or Orthoses: Spinal Brace Spinal Brace: Applied in sitting position;Lumbar corset      Mobility Bed Mobility Overal bed mobility: Needs Assistance Bed Mobility: Rolling;Sidelying to Sit Rolling: Min assist Sidelying to sit: Min assist       General bed mobility comments: re educated on safe logroll technique.  Needed some truncal assist  Transfers Overall transfer level: Needs assistance Equipment used: Rolling walker (2 wheeled) Transfers: Sit to/from [employer] Transfers Sit to Stand: Supervision Stand pivot transfers: Supervision       General transfer comment: cues for hand placement and walker safety     Balance Overall balance assessment: No apparent balance deficits (not formally assessed)                                          ADL Overall ADL's : Needs assistance/impaired Eating/Feeding: Independent   Grooming: Wash/dry hands;Wash/dry face;Supervision/safety;Standing;Oral care Grooming Details (indicate cue type and reason): Pt was instructed in safe  technique for brushing teeth to avoid bending  Upper Body Bathing: Supervision/ safety;Sitting   Lower Body Bathing: Supervison/ safety;Sit to/from stand   Upper Body Dressing : Supervision/safety;Sitting   Lower Body Dressing: Supervision/safety;Sit to/from stand   Toilet Transfer: Min guard;Comfort height Lexicographer Details (indicate cue type and reason): Pt unable to move stand to sit onto comfort height toilet without excessive bending.  Pt was instructe in use of 3in1 Toileting- Clothing Manipulation and Hygiene: Supervision/safety;Sit to/from stand       Functional mobility during ADLs: Supervision/safety;Rolling walker General ADL Comments: Pt instructed in back precautions and how to perform BADLs safely.  Pt with several questions which were answered     Vision                     Perception     Praxis      Pertinent Vitals/Pain See vitals flow sheet.  Back pain 6/10     Hand Dominance     Extremity/Trunk Assessment Upper Extremity Assessment Upper Extremity Assessment: Overall WFL for tasks assessed (Pt with arthritic deformity bil. UEs)   Lower Extremity Assessment Lower Extremity Assessment: Defer to PT evaluation LLE Deficits / Details: l leg weakness and with arthritic knee in need of surgery per pt.       Communication Communication Communication: No difficulties   Cognition Arousal/Alertness: Awake/alert Behavior During Therapy: WFL for tasks assessed/performed Overall Cognitive Status: Within Functional Limits for tasks assessed  General Comments       Exercises       Shoulder Instructions      Home Living Family/patient expects to be discharged to:: Private residence Living Arrangements: Spouse/significant other Available Help at Discharge: Family Type of Home: House Home Access: Stairs to enter Technical brewer of Steps: multiple Entrance Stairs-Rails:  Right;Left Home Layout: Multi-level;Bed/bath upstairs Alternate Level Stairs-Number of Steps: flight Alternate Level Stairs-Rails: Right;Left Bathroom Shower/Tub: Occupational psychologist: Handicapped height     Home Equipment: Environmental consultant - 2 wheels;Toilet riser;Shower seat;Adaptive equipment Adaptive Equipment: Reacher        Prior Functioning/Environment Level of Independence: Independent             OT Diagnosis: Generalized weakness;Acute pain   OT Problem List: Decreased strength;Decreased activity tolerance;Decreased knowledge of use of DME or AE;Decreased knowledge of precautions;Pain   OT Treatment/Interventions: Self-care/ADL training;DME and/or AE instruction;Therapeutic activities;Patient/family education    OT Goals(Current goals can be found in the care plan section) Acute Rehab OT Goals Patient Stated Goal: back to independence and get ready for other surgeries. OT Goal Formulation: With patient Time For Goal Achievement: 08/13/13 Potential to Achieve Goals: Good ADL Goals Pt Will Perform Grooming: with modified independence;standing Pt Will Perform Lower Body Bathing: with modified independence;sit to/from stand Pt Will Perform Lower Body Dressing: with modified independence;sit to/from stand Pt Will Transfer to Toilet: with modified independence;ambulating;bedside commode;grab bars;regular height toilet Pt Will Perform Tub/Shower Transfer: Shower transfer;with modified independence;ambulating;shower seat;rolling walker Additional ADL Goal #1: Pt will be independent with back precautions during all BADLs and functional mobility   OT Frequency: Min 2X/week   Barriers to D/C:            Co-evaluation              End of Session Equipment Utilized During Treatment: Rolling walker;Back brace Nurse Communication: Mobility status  Activity Tolerance: Patient tolerated treatment well Patient left: in chair;with call bell/phone within reach    Time: 5621-3086 OT Time Calculation (min): 39 min Charges:  OT General Charges $OT Visit: 1 Procedure OT Evaluation $Initial OT Evaluation Tier I: 1 Procedure OT Treatments $Self Care/Home Management : 23-37 mins G-Codes:    Sherice Ijames M Fahmida Jurich 2013/08/30, 12:34 PM

## 2013-08-06 NOTE — Progress Notes (Signed)
Copious amount of serosanguinous drainage noted form incision site. Dressing saturated and change twice at this time. MD on call notified.

## 2013-08-06 NOTE — Progress Notes (Signed)
Patient ID: [name], female   DOB: 1934-03-30, 79 y.o.   MRN: [id number] Dressing changed. No evidence of csf leak. No headache

## 2013-08-07 NOTE — Progress Notes (Signed)
Occupational Therapy Treatment Patient Details Name: [name] MRN: [id number] DOB: 1934/12/19 Today's Date: 08/07/2013    History of present illness Admitted with spondylolisthesis, hnp and stenosis and L1-L2 levels, s/p decompression and fusion at L1-L3 with hardware.  removal of old hardward.   OT comments  Pt able to perform BADLs with supervision.  Requires occasional cues to avoid twisting.   Follow Up Recommendations  No OT follow up;Supervision - Intermittent    Equipment Recommendations  3 in 1 bedside comode    Recommendations for Other Services      Precautions / Restrictions Precautions Precautions: Back;Fall Precaution Booklet Issued: Yes (comment) Precaution Comments: Pt requires min verbal cues for back precautions Required Braces or Orthoses: Spinal Brace Spinal Brace: Applied in sitting position;Lumbar corset Restrictions Weight Bearing Restrictions: No       Mobility Bed Mobility Overal bed mobility: Needs Assistance Bed Mobility: Rolling;Sidelying to Sit;Sit to Sidelying Rolling: Supervision Sidelying to sit: Supervision     Sit to sidelying: Supervision General bed mobility comments: Pt able to demonstrates safe technique  Transfers Overall transfer level: Needs assistance Equipment used: Rolling walker (2 wheeled) Transfers: Sit to/from [facility] Sit to Stand: Supervision Stand pivot transfers: Supervision            Balance Overall balance assessment: No apparent balance deficits (not formally assessed)                                 ADL Overall ADL's : Needs assistance/impaired     Grooming: Wash/dry hands;Wash/dry face;Supervision/safety;Standing;Oral care               Lower Body Dressing: Supervision/safety;Sit to/from stand   Toilet Transfer: Supervision/safety;Ambulation;Comfort height toilet;BSC   Toileting- Clothing Manipulation and Hygiene: Supervision/safety;Sit to/from  stand;Adhering to back precautions       Functional mobility during ADLs: Supervision/safety;Rolling walker General ADL Comments: Pt able to recall instructions provided yesterday and demonstrates understanding of safe techniques for BADLs.  Requires min verbal cues to avoid twisting      Vision                     Perception     Praxis      Cognition   Behavior During Therapy: [facility] for tasks assessed/performed Overall Cognitive Status: Within Functional Limits for tasks assessed                       Extremity/Trunk Assessment               Exercises     Shoulder Instructions       General Comments      Pertinent Vitals/ Pain       See vitals flow sheet  Home Living                                          Prior Functioning/Environment              Frequency Min 2X/week     Progress Toward Goals  OT Goals(current goals can now be found in the care plan section)  Progress towards OT goals: Progressing toward goals  ADL Goals Pt Will Perform Grooming: with modified independence;standing Pt Will Perform Lower Body Bathing: with modified independence;sit to/from stand Pt Will Perform Lower  Body Dressing: with modified independence;sit to/from stand Pt Will Transfer to Toilet: with modified independence;ambulating;bedside commode;grab bars;regular height toilet Pt Will Perform Tub/Shower Transfer: Shower transfer;with modified independence;ambulating;shower seat;rolling walker Additional ADL Goal #1: Pt will be independent with back precautions during all BADLs and functional mobility   Plan Discharge plan remains appropriate    Co-evaluation                 End of Session Equipment Utilized During Treatment: Rolling walker;Back brace   Activity Tolerance Patient tolerated treatment well   Patient Left in chair;with call bell/phone within reach   Nurse Communication Mobility status        Time:  9233-0076 OT Time Calculation (min): 35 min  Charges: OT General Charges $OT Visit: 1 Procedure OT Treatments $Self Care/Home Management : 23-37 mins  Isaack Preble M [place] 08/07/2013, 9:36 PM

## 2013-08-07 NOTE — Anesthesia Postprocedure Evaluation (Signed)
Anesthesia Post Note  [name]  Procedure(s) Performed: Procedure(s) (LRB): POSTERIOR LUMBAR FUSION 1 LEVEL, lumbar one/two (N/A)  Anesthesia type: general  Patient location: PACU  Post pain: Pain level controlled  Post assessment: Patient's Cardiovascular Status Stable  Post vital signs: Reviewed and stable  Level of consciousness: sedated  Complications: No apparent anesthesia complications

## 2013-08-07 NOTE — Progress Notes (Signed)
Physical Therapy Treatment Patient Details Name: [name] MRN: [id number] DOB: [birth year] Today's Date: 08/07/2013    History of Present Illness Admitted with spondylolisthesis, hnp and stenosis and L1-L2 levels, s/p decompression and fusion at L1-L3 with hardware.  removal of old hardward.    PT Comments    Progressing well.  Asks lots of good clarifying question.  Education has been reinforced in total, but continues.   Follow Up Recommendations  No PT follow up     Equipment Recommendations  None recommended by PT    Recommendations for Other Services       Precautions / Restrictions Precautions Precautions: Back;Fall Precaution Booklet Issued: Yes (comment) Required Braces or Orthoses: Spinal Brace Spinal Brace: Applied in sitting position;Lumbar corset    Mobility  Bed Mobility Overal bed mobility: Needs Assistance Bed Mobility: Rolling;Sidelying to Sit;Sit to Sidelying Rolling: Supervision Sidelying to sit: Supervision     Sit to sidelying: Supervision General bed mobility comments: reinforced logroll and safe technique up and down from EOB  Transfers Overall transfer level: Needs assistance Equipment used: Rolling walker (2 wheeled) Transfers: Sit to/from Stand Sit to Stand: Supervision         General transfer comment: cues for hand placement and walker safety   Ambulation/Gait Ambulation/Gait assistance: Supervision Ambulation Distance (Feet): 300 Feet Assistive device: Rolling walker (2 wheeled) Gait Pattern/deviations: Step-through pattern Gait velocity: slower Gait velocity interpretation: Below normal speed for age/gender General Gait Details: slow and steady   Stairs Stairs: Yes Stairs assistance: Supervision Stair Management: One rail Right;Step to pattern;Forwards Number of Stairs: 8 General stair comments: steady and safe  Wheelchair Mobility    Modified Rankin (Stroke Patients Only)       Balance Overall balance  assessment: No apparent balance deficits (not formally assessed)                                  Cognition Arousal/Alertness: Awake/alert Behavior During Therapy: WFL for tasks assessed/performed Overall Cognitive Status: Within Functional Limits for tasks assessed                      Exercises      General Comments        Pertinent Vitals/Pain     Home Living                      Prior Function            PT Goals (current goals can now be found in the care plan section) Acute Rehab PT Goals Patient Stated Goal: back to independence and get ready for other surgeries. PT Goal Formulation: With patient Time For Goal Achievement: 08/13/13 Potential to Achieve Goals: Good Progress towards PT goals: Progressing toward goals    Frequency  Min 5X/week    PT Plan Current plan remains appropriate    Co-evaluation             End of Session Equipment Utilized During Treatment: Back brace Activity Tolerance: Patient tolerated treatment well Patient left: in bed;with call bell/phone within reach     Time: 1115-1144 PT Time Calculation (min): 29 min  Charges:  $Gait Training: 8-22 mins $Self Care/Home Management: 8-22                    G Codes:      [employer] V 08/07/2013, 11:53 AM 08/07/2013  Donnella Sham, PT [phone]  (pager)

## 2013-08-07 NOTE — Progress Notes (Signed)
Subjective: Patient reports Drainage remained substantial from back incision. Dressing changes are still 3 times a day.  Objective: Vital signs in last 24 hours: Temp:  [98.2 F (36.8 C)-99.7 F (37.6 C)] 98.9 F (37.2 C) (05/28 1814) Pulse Rate:  [56-92] 92 (05/28 1814) Resp:  [16-18] 18 (05/28 1814) BP: (98-140)/(47-63) 140/47 mmHg (05/28 1814) SpO2:  [98 %-100 %] 100 % (05/28 1814)  Intake/Output from previous day: 05/27 0701 - 05/28 0700 In: -  Out: 360 [Urine:360] Intake/Output this shift:    Abdomen soft some constipation noted bowel sounds are present. Dressing intact some moderate bleedthrough.  Lab Results:  Recent Labs  08/06/13 0500  WBC 7.2  HGB 10.5*  HCT 31.5*  PLT 179   BMET  Recent Labs  08/06/13 0500  NA 138  K 3.4*  CL 103  CO2 24  GLUCOSE 94  BUN 22  CREATININE 0.81  CALCIUM 8.1*    Studies/Results: No results found.  Assessment/Plan: Continue drainage from lumbar spine incision. Will observe an additional day in hospital hopefully drainage will subside.  LOS: 2 days  Laxatives to be given for difficulty with constipation however patient does have ulcerative colitis therefore will have to be gingerly. Hopefully patient will be able to be discharged tomorrow if drainage continues to decrease.   [name] 08/07/2013, 7:36 PM

## 2013-08-08 MED ORDER — OXYCODONE-ACETAMINOPHEN 5-325 MG PO TABS: 1.0000 | ORAL_TABLET | Freq: Four times a day (QID) | ORAL | Status: DC | PRN

## 2013-08-08 MED ORDER — DIAZEPAM 5 MG PO TABS: 5.0000 mg | ORAL_TABLET | Freq: Four times a day (QID) | ORAL | Status: DC | PRN

## 2013-08-08 NOTE — Progress Notes (Signed)
Took over care of patient at 1500 from [place], [place], RN

## 2013-08-08 NOTE — Discharge Summary (Signed)
Physician Discharge Summary  Patient ID: [name] MRN: [id number] DOB/AGE: March 03, 1935 79 y.o.  Admit date: 08/05/2013 Discharge date: 08/08/2013  Admission Diagnoses: Spondylolisthesis and stenosis L1-L2, status post arthrodesis L2-L5  Discharge Diagnoses: Spondylolisthesis and stenosis L1-L2, status post arthrodesis L2-L5 Rheumatoid arthritis Active Problems:   Lumbar stenosis with neurogenic claudication   Discharged Condition: fair  Hospital Course: Patient was admitted to undergo surgical decompression and arthrodesis at L1-L2 having had previous fusion at L3-L5 and then add on fusion L2-L3. She had revision of the hardware and tolerated the surgery well. Her incision has however continued to have persistent drainage since the time of surgery. This is slowly improving.  Consults: None  Significant Diagnostic Studies: None  Treatments: surgery: Decompression and fusion L1-L3  Discharge Exam: Blood pressure 128/53, pulse 74, temperature 97.1 F (36.2 C), temperature source Oral, resp. rate 18, height 5' 6.7" (1.694 m), weight 63.141 kg (139 lb 3.2 oz), SpO2 100.00%. Incision is clean and dry. Motor function is intact in iliopsoas quadriceps tibialis anterior and gastrocs. Midline incision with some drainage at the inferior margin no erythema nontender.  Disposition: 01-Home or Self Care  Discharge Instructions   Call MD for:  redness, tenderness, or signs of infection (pain, swelling, redness, odor or green/yellow discharge around incision site)    Complete by:  As directed      Call MD for:  severe uncontrolled pain    Complete by:  As directed      Call MD for:  temperature >100.4    Complete by:  As directed      Care order/instruction    Complete by:  As directed   Provide 4 x 4's and Betadine swab sticks along with current role of Hypafix tape for several dressing changes     Diet - low sodium heart healthy    Complete by:  As directed      Discharge  instructions    Complete by:  As directed   Okay to shower. Do not apply salves or appointments to incision. No heavy lifting with the upper extremities greater than 15 pounds. May resume driving when not requiring pain medication and patient feels comfortable with doing so.     Increase activity slowly    Complete by:  As directed             Medication List         adalimumab 40 MG/0.8ML injection  Commonly known as:  HUMIRA  Inject 0.8 mLs (40 mg total) into the skin every 14 (fourteen) days. Resume ONLY if off all antibiotics     aspirin EC 81 MG tablet  Take 81 mg by mouth every other day.     balsalazide 750 MG capsule  Commonly known as:  COLAZAL  Take 2,250 mg by mouth 3 (three) times daily.     Calcium + D3 600-200 MG-UNIT Tabs  Take 1 tablet by mouth daily.     CELEBREX 200 MG capsule  Generic drug:  celecoxib  Take 400 mg by mouth daily.     diazepam 5 MG tablet  Commonly known as:  VALIUM  Take 1 tablet (5 mg total) by mouth every 6 (six) hours as needed. For muscle spasms.     diazepam 5 MG tablet  Commonly known as:  VALIUM  Take 1 tablet (5 mg total) by mouth every 6 (six) hours as needed for anxiety or muscle spasms.     estradiol 0.5 MG tablet  Commonly  known as:  ESTRACE  Take 0.5 mg by mouth daily.     Fish Oil 1200 MG Caps  Take 1,200 mg by mouth daily.     folic acid 1 MG tablet  Commonly known as:  FOLVITE  Take 2 mg by mouth daily.     guaiFENesin 600 MG 12 hr tablet  Commonly known as:  MUCINEX  Take 1,200 mg by mouth daily as needed (sinus relief).     hydrochlorothiazide 25 MG tablet  Commonly known as:  HYDRODIURIL  Take 25 mg by mouth daily with breakfast.     HYDROcodone-acetaminophen 5-325 MG per tablet  Commonly known as:  NORCO/VICODIN  Take 1 tablet by mouth every 6 (six) hours as needed (pain).     latanoprost 0.005 % ophthalmic solution  Commonly known as:  XALATAN  Place 1 drop into both eyes at bedtime.      leflunomide 10 MG tablet  Commonly known as:  ARAVA  Take 20 mg by mouth daily.     levothyroxine 88 MCG tablet  Commonly known as:  SYNTHROID, LEVOTHROID  Take 88 mcg by mouth daily before breakfast.     lisinopril 5 MG tablet  Commonly known as:  PRINIVIL,ZESTRIL  Take 1 tablet (5 mg total) by mouth daily. For high blood pressure     medroxyPROGESTERone 2.5 MG tablet  Commonly known as:  PROVERA  Take 2.5 mg by mouth at bedtime.     multivitamin with minerals Tabs tablet  Take 1 tablet by mouth daily.     oxyCODONE-acetaminophen 5-325 MG per tablet  Commonly known as:  PERCOCET/ROXICET  Take 1 tablet by mouth every 6 (six) hours as needed (pain).     oxyCODONE-acetaminophen 5-325 MG per tablet  Commonly known as:  PERCOCET/ROXICET  Take 1 tablet by mouth every 6 (six) hours as needed (pain).     predniSONE 5 MG tablet  Commonly known as:  DELTASONE  Take 5 mg by mouth daily with breakfast.     Vitamin D3 1000 UNITS Caps  Take 1,000 Units by mouth daily.         Signed: [name] 08/08/2013, 3:19 PM  Spondylolisthesis and stenosis L1-L2

## 2013-08-08 NOTE — Progress Notes (Signed)
Physical Therapy Treatment Patient Details Name: [name] MRN: [id number] DOB: Apr 10, 1934 Today's Date: 08/08/2013    History of Present Illness Admitted with spondylolisthesis, hnp and stenosis and L1-L2 levels, s/p decompression and fusion at L1-L3 with hardware.  removal of old hardward.    PT Comments    Progressing well.  Ready for d/c home with husband's assist.  Education reinforced and questions answered. No further PT needs after d/c, but will continue to see until d/c from acute.   Follow Up Recommendations  No PT follow up     Equipment Recommendations  None recommended by PT    Recommendations for Other Services       Precautions / Restrictions Precautions Precautions: Back;Fall Precaution Booklet Issued: Yes (comment) Precaution Comments: Pt requires min verbal cues for back precautions Required Braces or Orthoses: Spinal Brace Spinal Brace: Applied in sitting position;Lumbar corset    Mobility  Bed Mobility Overal bed mobility: Needs Assistance Bed Mobility: Rolling;Sidelying to Sit;Sit to Sidelying Rolling: Supervision Sidelying to sit: Supervision     Sit to sidelying: Supervision General bed mobility comments: demonstrates slow, but safe technique  Transfers   Equipment used: Rolling walker (2 wheeled) Transfers: Sit to/from Stand Sit to Stand: Supervision Stand pivot transfers: Supervision       General transfer comment: cues for hand placement and walker safety   Ambulation/Gait Ambulation/Gait assistance: Supervision Ambulation Distance (Feet): 350 Feet Assistive device: Rolling walker (2 wheeled) Gait Pattern/deviations: Step-through pattern;Decreased step length - right;Decreased step length - left Gait velocity: slower   General Gait Details: slow and generally steady, but with instability at L hip  (retropulsive in nature)   Stairs            Wheelchair Mobility    Modified Rankin (Stroke Patients Only)        Balance                                    Cognition Arousal/Alertness: Awake/alert Behavior During Therapy: WFL for tasks assessed/performed Overall Cognitive Status: Within Functional Limits for tasks assessed                      Exercises      General Comments        Pertinent Vitals/Pain     Home Living                      Prior Function            PT Goals (current goals can now be found in the care plan section) Acute Rehab PT Goals Patient Stated Goal: back to independence and get ready for other surgeries. PT Goal Formulation: With patient Time For Goal Achievement: 08/13/13 Potential to Achieve Goals: Good Progress towards PT goals: Progressing toward goals    Frequency  Min 5X/week    PT Plan Current plan remains appropriate    Co-evaluation             End of Session   Activity Tolerance: Patient tolerated treatment well Patient left: in bed;with call bell/phone within reach     Time: 0942-1014 PT Time Calculation (min): 32 min  Charges:  $Gait Training: 8-22 mins $Self Care/Home Management: 8-22                    G Codes:      Jack Quarto  V 08/08/2013, 10:19 AM 08/08/2013  Donnella Sham, PT [phone]  (pager)

## 2013-08-09 MED ORDER — DOXYCYCLINE HYCLATE 50 MG PO CAPS: 100.0000 mg | ORAL_CAPSULE | Freq: Two times a day (BID) | ORAL | Status: DC

## 2013-08-09 NOTE — Progress Notes (Addendum)
Pt d/c  home. AVS  Incision care and prescription and discharge  and follow up instruction given to pt and spouse. Dressing change to surgical incision done as pt spout observe. No active draining ,redness or s/s of infection noted . Skin tear to left am drs also changed.  the patient verbalized relieve of pain  DME  equipment  Delivered. Condition at discharge is stable.

## 2013-08-09 NOTE — Progress Notes (Signed)
Patient ID: [name], female   DOB: 1935-03-13, 79 y.o.   MRN: [id number] Doing well neurologically stable no headache no fever  Strength out of 5 wound clean dry and intact very small amount of drainage  Discharge home

## 2013-08-09 NOTE — Progress Notes (Signed)
Physical Therapy Treatment Patient Details Name: [name] MRN: [id number] DOB: Jul 23, 1934 Today's Date: 08/09/2013    History of Present Illness Admitted with spondylolisthesis, hnp and stenosis and L1-L2 levels, s/p decompression and fusion at L1-L3 with hardware.  removal of old hardward.    PT Comments    Pt pleasant & willing to participate in therapy.  Practiced bed mobility & steps again today.  Pt reports LE weakness which causes her to rely heavily on UE support when walking & steps but overall she's doing well & safe to d/c home from mobility standpoint.     Follow Up Recommendations  No PT follow up     Equipment Recommendations  None recommended by PT    Recommendations for Other Services       Precautions / Restrictions Precautions Precautions: Back;Fall Precaution Comments: Pt requires min verbal cues for back precautions Required Braces or Orthoses: Spinal Brace Spinal Brace: Applied in sitting position;Lumbar corset Restrictions Weight Bearing Restrictions: No    Mobility  Bed Mobility Overal bed mobility: Needs Assistance Bed Mobility: Rolling;Sidelying to Sit;Sit to Sidelying Rolling: Supervision Sidelying to sit: Supervision     Sit to sidelying: Supervision General bed mobility comments: Cues to reinforce technique.  Performed without use of rail with no physical (A) needed.    Transfers Overall transfer level: Needs assistance Equipment used: Rolling walker (2 wheeled) Transfers: Sit to/from Stand Sit to Stand: Supervision Stand pivot transfers: Supervision       General transfer comment: cues for hand placement & back precautions as she had significant trunk flexion with sit>stand.  pt needing to start with one hand on RW to stand.    Ambulation/Gait Ambulation/Gait assistance: Supervision Ambulation Distance (Feet): 300 Feet Assistive device: Rolling walker (2 wheeled) Gait Pattern/deviations: Step-through pattern;Decreased  stride length;Antalgic     General Gait Details: Min cueing for better use of walker.  She does rely heavily on RW due to LE weakness but overall she is safe & does well     Stairs Stairs: Yes Stairs assistance: Min guard Stair Management: One rail Left;Sideways Number of Stairs: 8 General stair comments: Pt relying heavily on rail & pulling herself up to next step due to LE weakness & having difficulty stepping up.    Wheelchair Mobility    Modified Rankin (Stroke Patients Only)       Balance                                    Cognition Arousal/Alertness: Awake/alert Behavior During Therapy: WFL for tasks assessed/performed Overall Cognitive Status: Within Functional Limits for tasks assessed                      Exercises      General Comments        Pertinent Vitals/Pain C/o back pain & LE weakness but did not rate pain.      Home Living                      Prior Function            PT Goals (current goals can now be found in the care plan section) Acute Rehab PT Goals Patient Stated Goal: back to independence and get ready for other surgeries. PT Goal Formulation: With patient Time For Goal Achievement: 08/13/13 Potential to Achieve Goals: Good Progress towards PT goals: Progressing  toward goals    Frequency  Min 5X/week    PT Plan Current plan remains appropriate    Co-evaluation             End of Session Equipment Utilized During Treatment: Back brace Activity Tolerance: Patient tolerated treatment well Patient left: in chair;with call bell/phone within reach     Time: 0943-1006 PT Time Calculation (min): 23 min  Charges:  $Gait Training: 8-22 mins $Therapeutic Activity: 8-22 mins                    G Codes:      Sena Hitch September 08, 2013, 11:48 AM  Sarajane Marek, PTA [phone] 09-08-13

## 2013-08-27 ENCOUNTER — Ambulatory Visit (INDEPENDENT_AMBULATORY_CARE_PROVIDER_SITE_OTHER): Payer: Medicare Other | Admitting: Nurse Practitioner

## 2013-08-27 ENCOUNTER — Encounter: Payer: Self-pay | Admitting: Nurse Practitioner

## 2013-08-27 VITALS — BP 140/70 | HR 76 | Temp 98.5°F | Resp 18 | Ht 66.7 in | Wt 128.0 lb

## 2013-08-27 DIAGNOSIS — I10 Essential (primary) hypertension: Secondary | ICD-10-CM

## 2013-08-27 DIAGNOSIS — R5383 Other fatigue: Principal | ICD-10-CM

## 2013-08-27 DIAGNOSIS — K5900 Constipation, unspecified: Secondary | ICD-10-CM

## 2013-08-27 DIAGNOSIS — R5381 Other malaise: Secondary | ICD-10-CM

## 2013-08-27 DIAGNOSIS — M48062 Spinal stenosis, lumbar region with neurogenic claudication: Secondary | ICD-10-CM

## 2013-08-27 DIAGNOSIS — R609 Edema, unspecified: Secondary | ICD-10-CM

## 2013-08-27 NOTE — Patient Instructions (Addendum)
Start with colace 100 mg 1 tablet daily-- hold if you have having loose stools or diarrhea May take miralax 17 gm with 8 oz -fluid of choice- as needed if you have not had a BM in a few days and are symptomatic May stop all medications once bowels become regular and you have reduced pain medication -check blood work today   STOP lisinopril- check blood pressure daily (after you have taken your diuretic ~1 hour) when you have been sitting for a least 5 mins  Keep follow up with [name]   Constipation Constipation is when a person has fewer than three bowel movements a week, has difficulty having a bowel movement, or has stools that are dry, hard, or larger than normal. As people grow older, constipation is more common. If you try to fix constipation with medicines that make you have a bowel movement (laxatives), the problem may get worse. Long-term laxative use may cause the muscles of the colon to become weak. A low-fiber diet, not taking in enough fluids, and taking certain medicines may make constipation worse.  CAUSES   Certain medicines, such as antidepressants, pain medicine, iron supplements, antacids, and water pills.   Certain diseases, such as diabetes, irritable bowel syndrome (IBS), thyroid disease, or depression.   Not drinking enough water.   Not eating enough fiber-rich foods.   Stress or travel.   Lack of physical activity or exercise.   Ignoring the urge to have a bowel movement.   Using laxatives too much.  SIGNS AND SYMPTOMS   Having fewer than three bowel movements a week.   Straining to have a bowel movement.   Having stools that are hard, dry, or larger than normal.   Feeling full or bloated.   Pain in the lower abdomen.   Not feeling relief after having a bowel movement.  DIAGNOSIS  Your health care provider will take a medical history and perform a physical exam. Further testing may be done for severe constipation. Some tests may  include:  A barium enema X-ray to examine your rectum, colon, and, sometimes, your small intestine.   A sigmoidoscopy to examine your lower colon.   A colonoscopy to examine your entire colon. TREATMENT  Treatment will depend on the severity of your constipation and what is causing it. Some dietary treatments include drinking more fluids and eating more fiber-rich foods. Lifestyle treatments may include regular exercise. If these diet and lifestyle recommendations do not help, your health care provider may recommend taking over-the-counter laxative medicines to help you have bowel movements. Prescription medicines may be prescribed if over-the-counter medicines do not work.  HOME CARE INSTRUCTIONS   Eat foods that have a lot of fiber, such as fruits, vegetables, whole grains, and beans.  Limit foods high in fat and processed sugars, such as french fries, hamburgers, cookies, candies, and soda.   A fiber supplement may be added to your diet if you cannot get enough fiber from foods.   Drink enough fluids to keep your urine clear or pale yellow.   Exercise regularly or as directed by your health care provider.   Go to the restroom when you have the urge to go. Do not hold it.   Only take over-the-counter or prescription medicines as directed by your health care provider. Do not take other medicines for constipation without talking to your health care provider first.  [place] IF:   You have bright red blood in your stool.   Your constipation  lasts for more than 4 days or gets worse.   You have abdominal or rectal pain.   You have thin, pencil-like stools.   You have unexplained weight loss. MAKE SURE YOU:   Understand these instructions.  Will watch your condition.  Will get help right away if you are not doing well or get worse. Document Released: 11/26/2003 Document Revised: 03/04/2013 Document Reviewed: 12/09/2012 [facility] Patient  Information 2015 [place], [place]. This information is not intended to replace advice given to you by your health care provider. Make sure you discuss any questions you have with your health care provider.

## 2013-08-27 NOTE — Progress Notes (Signed)
Patient ID: [name], female   DOB: 1934/11/17, 79 y.o.   MRN: [id number]    No Known Allergies  Chief Complaint  Patient presents with  . Acute Visit    no appetite and energy, bp running low    HPI: Patient is a 79 y.o. female seen in the office today for blood pressure and fatigue  Pt reports since she has changed her blood pressure medication she has had no appetite, no energy and constipation which was around the time increased back pain; She is now s/p LUMBAR FUSION done on may 26th. Pain is much better however she has had constipation since.  Currently taking percocet and valium-- has weaned herself from percocet and valium but still taking it at time.   Not taking anything regularly for stools  Review of Systems:  Review of Systems  Constitutional: Positive for malaise/fatigue. Negative for fever, chills and weight loss.  HENT: Negative for nosebleeds and tinnitus.   Eyes: Negative for blurred vision.  Respiratory: Negative for cough, sputum production and shortness of breath.   Cardiovascular: Negative for chest pain, palpitations and leg swelling.  Gastrointestinal: Positive for constipation. Negative for heartburn, nausea, vomiting, abdominal pain and diarrhea.  Musculoskeletal: Positive for back pain (improved ).  Neurological: Positive for tingling (left arm). Negative for dizziness, weakness and headaches.  Psychiatric/Behavioral: The patient is nervous/anxious (has improved).      Past Medical History  Diagnosis Date  . Ulcerative colitis   . Hypothyroidism   . Hypertension     pcp   dr reed   [facility]  . Pneumonia 2008  . Chronic bronchitis 10/31/2011    "prone to it; I have sinus/bronchitis problem probably q yr"  . Multiple skin tears 10/31/2011    "get them very easily; my skin is thin and I bruise easily"  . Chronic back pain     "my entire spine"  . Edema   . Other and unspecified hyperlipidemia   . Encounter for long-term (current) use of  other medications   . Senile osteoporosis   . Spinal stenosis, unspecified region other than cervical   . Unspecified glaucoma   . Hypothyroidism   . Complication of anesthesia 2009    htn with block- for hand surgery   . Anxiety     sometimes   . Rheumatoid arthritis(714.0)     degenerative disc disease    Past Surgical History  Procedure Laterality Date  . Back surgery    . Sinus surgery with instatrak    . Thyroidectomy, partial  1997  . Posterior fusion lumbar spine  10/31/2011    L2-3; 3-4  . Tonsillectomy      "when I was a small child"  . Functional endoscopic sinus surgery  1988  . Posterior fusion lumbar spine  11/2009    L3-4;  L4-5  . Joint replacement  2009; 2006    joints 2 fingers,rt hand; joint left thumb  . Spine surgery     Social History:   reports that she has been passively smoking Cigarettes.  She has a 12.5 pack-year smoking history. She has never used smokeless tobacco. She reports that she drinks about 2.4 ounces of alcohol per week. She reports that she does not use illicit drugs.  Family History  Problem Relation Age of Onset  . Heart disease Mother   . Cancer Father     lung    Medications: Patient's Medications  New Prescriptions   No medications  on file  Previous Medications   ADALIMUMAB (HUMIRA) 40 MG/0.8ML INJECTION    Inject 0.8 mLs (40 mg total) into the skin every 14 (fourteen) days. Resume ONLY if off all antibiotics   ASPIRIN EC 81 MG TABLET    Take 81 mg by mouth every other day.   BALSALAZIDE (COLAZAL) 750 MG CAPSULE    Take 2,250 mg by mouth 3 (three) times daily.   CALCIUM CARB-CHOLECALCIFEROL (CALCIUM + D3) 600-200 MG-UNIT TABS    Take 1 tablet by mouth daily.   CELEBREX 200 MG CAPSULE    Take 400 mg by mouth daily.    CHOLECALCIFEROL (VITAMIN D3) 1000 UNITS CAPS    Take 1,000 Units by mouth daily.   DIAZEPAM (VALIUM) 5 MG TABLET    Take 1 tablet (5 mg total) by mouth every 6 (six) hours as needed. For muscle spasms.    DOXYCYCLINE (VIBRAMYCIN) 50 MG CAPSULE    Take 2 capsules (100 mg total) by mouth 2 (two) times daily.   ESTRADIOL (ESTRACE) 0.5 MG TABLET    Take 0.5 mg by mouth daily.    FOLIC ACID (FOLVITE) 1 MG TABLET    Take 2 mg by mouth daily.   GUAIFENESIN (MUCINEX) 600 MG 12 HR TABLET    Take 1,200 mg by mouth daily as needed (sinus relief).    HYDROCHLOROTHIAZIDE (HYDRODIURIL) 25 MG TABLET    Take 25 mg by mouth daily with breakfast.   HYDROCODONE-ACETAMINOPHEN (NORCO/VICODIN) 5-325 MG PER TABLET    Take 1 tablet by mouth every 6 (six) hours as needed (pain).    LATANOPROST (XALATAN) 0.005 % OPHTHALMIC SOLUTION    Place 1 drop into both eyes at bedtime.   LEFLUNOMIDE (ARAVA) 10 MG TABLET    Take 20 mg by mouth daily.    LEVOTHYROXINE (SYNTHROID, LEVOTHROID) 88 MCG TABLET    Take 88 mcg by mouth daily before breakfast.   LISINOPRIL (PRINIVIL,ZESTRIL) 5 MG TABLET    Take 1 tablet (5 mg total) by mouth daily. For high blood pressure   MEDROXYPROGESTERONE (PROVERA) 2.5 MG TABLET    Take 2.5 mg by mouth at bedtime.    MULTIPLE VITAMIN (MULTIVITAMIN WITH MINERALS) TABS    Take 1 tablet by mouth daily.   OMEGA-3 FATTY ACIDS (FISH OIL) 1200 MG CAPS    Take 1,200 mg by mouth daily.   OXYCODONE-ACETAMINOPHEN (PERCOCET/ROXICET) 5-325 MG PER TABLET    Take 1 tablet by mouth every 6 (six) hours as needed (pain).   PREDNISONE (DELTASONE) 5 MG TABLET    Take 5 mg by mouth daily with breakfast.   Modified Medications   No medications on file  Discontinued Medications   DIAZEPAM (VALIUM) 5 MG TABLET    Take 1 tablet (5 mg total) by mouth every 6 (six) hours as needed for anxiety or muscle spasms.   OXYCODONE-ACETAMINOPHEN (PERCOCET/ROXICET) 5-325 MG PER TABLET    Take 1 tablet by mouth every 6 (six) hours as needed (pain).      Physical Exam:  Filed Vitals:   08/27/13 1423  BP: 140/70  Pulse: 76  Temp: 98.5 F (36.9 C)  TempSrc: Oral  Resp: 18  Height: 5' 6.7" (1.694 m)  Weight: 128 lb (58.06 kg)  SpO2:  96%    Physical Exam  Constitutional: She is oriented to person, place, and time and well-developed, well-nourished, and in no distress.  Eyes: Conjunctivae and EOM are normal. Pupils are equal, round, and reactive to light.  Neck: Normal range of  motion. Neck supple. No JVD present.  Cardiovascular: Normal rate, regular rhythm and normal heart sounds.   Pulmonary/Chest: Effort normal and breath sounds normal. No respiratory distress.  Abdominal: Soft. Bowel sounds are normal. She exhibits no distension. There is no tenderness.  Musculoskeletal: She exhibits no edema and no tenderness.  Neurological: She is alert and oriented to person, place, and time.  Skin: Skin is warm and dry. She is not diaphoretic. No erythema.  Psychiatric: Affect normal.     Labs reviewed: Basic Metabolic Panel:  Recent Labs  03/14/13 0832 07/16/13 1505 07/31/13 1609 08/06/13 0500  NA  --  142 140 138  K  --  4.0 3.8 3.4*  CL  --  101 103 103  CO2  --  27 25 24   GLUCOSE  --  91 80 94  BUN  --  27 24* 22  CREATININE  --  0.89 0.76 0.81  CALCIUM  --  9.5 9.5 8.1*  TSH 1.060  --   --   --    Liver Function Tests: No results found for this basename: AST, ALT, ALKPHOS, BILITOT, PROT, ALBUMIN,  in the last 8760 hours No results found for this basename: LIPASE, AMYLASE,  in the last 8760 hours No results found for this basename: AMMONIA,  in the last 8760 hours CBC:  Recent Labs  07/31/13 1609 08/06/13 0500  WBC 7.5 7.2  HGB 13.1 10.5*  HCT 40.5 31.5*  MCV 92.9 92.9  PLT 241 179   Lipid Panel:  Recent Labs  03/14/13 0832  HDL 105  LDLCALC 154*  TRIG 110  CHOLHDL 2.7   TSH:  Recent Labs  03/14/13 0832  TSH 1.060   A1C: No results found for this basename: HGBA1C     Assessment/Plan  1. Other malaise and fatigue - encouraged to slowly increase activity as tolerated, proper nutrition -decreasing pain medication as tolerates -will follow up labs -bmp - CBC With  differential/Platelet - TSH  2. HTN (hypertension) -blood pressure after surgery was low and pt brings recordings of 90s-100s/50s -will have her stop taking this and monitor blood pressure, to bring log to next visit  - Basic metabolic panel  3. Lumbar stenosis with neurogenic claudication S/p lumbar fusion -conts on pain medication, has cut back on percocet and valium but still is requiring due to pain  4. Edema  bilateral LE, this is stable, may use TED hose, will cont HCtZ due to this  5. Unspecified constipation Start with colace 100 mg 1 tablet daily-- hold if you have having loose stools or diarrhea May take miralax 17 gm with 8 oz -fluid of choice- as needed if you have not had a BM in a few days and are symptomatic   To keep follow up with Dr Mariea Clonts

## 2013-08-28 LAB — BASIC METABOLIC PANEL
BUN/Creatinine Ratio: 29 — ABNORMAL HIGH (ref 11–26)
BUN: 26 mg/dL (ref 8–27)
CO2: 24 mmol/L (ref 18–29)
CREATININE: 0.91 mg/dL (ref 0.57–1.00)
Calcium: 9.6 mg/dL (ref 8.7–10.3)
Chloride: 102 mmol/L (ref 97–108)
GFR calc Af Amer: 71 mL/min/{1.73_m2} (ref >59)
GFR, EST NON AFRICAN AMERICAN: 61 mL/min/{1.73_m2} (ref >59)
Glucose: 140 mg/dL — ABNORMAL HIGH (ref 65–99)
Potassium: 4.9 mmol/L (ref 3.5–5.2)
Sodium: 143 mmol/L (ref 134–144)

## 2013-08-28 LAB — CBC WITH DIFFERENTIAL
BASOS: 0 %
Basophils Absolute: 0 10*3/uL (ref 0.0–0.2)
EOS ABS: 0 10*3/uL (ref 0.0–0.4)
Eos: 0 %
HCT: 34.7 % (ref 34.0–46.6)
HEMOGLOBIN: 11.5 g/dL (ref 11.1–15.9)
Immature Grans (Abs): 0 10*3/uL (ref 0.0–0.1)
Immature Granulocytes: 0 %
Lymphocytes Absolute: 0.9 10*3/uL (ref 0.7–3.1)
Lymphs: 18 %
MCH: 30.1 pg (ref 26.6–33.0)
MCHC: 33.1 g/dL (ref 31.5–35.7)
MCV: 91 fL (ref 79–97)
MONOS ABS: 0.4 10*3/uL (ref 0.1–0.9)
Monocytes: 8 %
NEUTROS ABS: 3.8 10*3/uL (ref 1.4–7.0)
Neutrophils Relative %: 74 %
Platelets: 314 10*3/uL (ref 150–379)
RBC: 3.82 x10E6/uL (ref 3.77–5.28)
RDW: 14.2 % (ref 12.3–15.4)
WBC: 5.2 10*3/uL (ref 3.4–10.8)

## 2013-08-28 LAB — TSH: TSH: 0.596 u[IU]/mL (ref 0.450–4.500)

## 2013-09-05 NOTE — OR Nursing (Signed)
Addendum to scope page 

## 2013-09-08 ENCOUNTER — Encounter: Payer: Self-pay | Admitting: Internal Medicine

## 2013-09-08 ENCOUNTER — Ambulatory Visit (INDEPENDENT_AMBULATORY_CARE_PROVIDER_SITE_OTHER): Payer: Medicare Other | Admitting: Internal Medicine

## 2013-09-08 VITALS — BP 120/70 | HR 81 | Temp 98.4°F | Resp 18 | Ht 66.7 in | Wt 125.0 lb

## 2013-09-08 DIAGNOSIS — R609 Edema, unspecified: Secondary | ICD-10-CM

## 2013-09-08 DIAGNOSIS — I8310 Varicose veins of unspecified lower extremity with inflammation: Secondary | ICD-10-CM

## 2013-09-08 DIAGNOSIS — E890 Postprocedural hypothyroidism: Secondary | ICD-10-CM | POA: Insufficient documentation

## 2013-09-08 DIAGNOSIS — R252 Cramp and spasm: Secondary | ICD-10-CM

## 2013-09-08 DIAGNOSIS — I10 Essential (primary) hypertension: Secondary | ICD-10-CM

## 2013-09-08 DIAGNOSIS — M48062 Spinal stenosis, lumbar region with neurogenic claudication: Secondary | ICD-10-CM

## 2013-09-08 DIAGNOSIS — E785 Hyperlipidemia, unspecified: Secondary | ICD-10-CM

## 2013-09-08 DIAGNOSIS — I872 Venous insufficiency (chronic) (peripheral): Secondary | ICD-10-CM | POA: Insufficient documentation

## 2013-09-08 DIAGNOSIS — M069 Rheumatoid arthritis, unspecified: Secondary | ICD-10-CM

## 2013-09-08 DIAGNOSIS — M4802 Spinal stenosis, cervical region: Secondary | ICD-10-CM | POA: Insufficient documentation

## 2013-09-08 DIAGNOSIS — K5190 Ulcerative colitis, unspecified, without complications: Secondary | ICD-10-CM | POA: Insufficient documentation

## 2013-09-08 NOTE — Progress Notes (Signed)
Patient ID: [name], female   DOB: 02/09/1935, 79 y.o.   MRN: [id number]   Location:  [facility] [place] [facility] [place] [facility]  Code Status: full code, discussed this with patient today.  She is going to develop living will and hcpoa with her husband in the near future.  She has the information she needs to complete this from her last hospitalization.  She understands the importance of this.  No Known Allergies  Chief Complaint  Patient presents with  . Follow-up    HPI: Patient is a 79 y.o. white female seen in the office today for medical mgt of chronic diseases.  She has been through a lot since I last saw her.  Saw Janett Billow about her high blood pressure.  Likely was pain-related, b/c better. Surgery has been great for her. Fluid retention in ankles has also abated.  Surgery was 5/26.  Loss of appetite and weight--down 14 lbs.  Using a brace around her.  Doesn't want to sit at the kitchen table b/c it is uncomfortable.  That factor hasn't changed though.  Also was terribly constipated.  Colace 1 tablet loosened stool.  She was off humira for 5 wks b/c of surgery and then antibiotics.  Now with diarrhea due to ulcerative colitis flare.  Had not had a flare in 2 years.  Plans to take her humira few days early b/c of a trip she has planned.  Feels tense, shaky, still slightly confused.  Not using any pain meds.  Feels cold all of the time inside.  Having trouble sleeping.  Prednisone unchanged.  Not back on her aspirin.  Does feel like she is somewhat anxious.  Found out she will need neck surgery, as well.  Has lost some hope now b/c of that future.  Decided on low back first b/c of her terrible spasms into her legs.  They have resolved.    Will need toenails trimmed.  Has been to podiatrist before.    Agrees to plan to get off of her estrogen through GYN in next month.    Is still using the nicotine containing e-cigarettes.  Has six puffs a day outside.  Has had  to postpone her [place] trip.  Now going to trip to [place] instead that is less strenuous.    Review of Systems:  Review of Systems  Constitutional: Positive for weight loss and malaise/fatigue. Negative for fever and chills.  HENT: Negative for congestion.   Eyes: Negative for blurred vision.  Respiratory: Negative for cough and shortness of breath.   Cardiovascular: Positive for leg swelling. Negative for chest pain.  Gastrointestinal: Positive for diarrhea and blood in stool. Negative for abdominal pain and melena.  Genitourinary: Negative for dysuria, urgency and frequency.  Musculoskeletal: Positive for back pain, myalgias and neck pain. Negative for falls.  Skin: Negative for rash.  Neurological: Negative for dizziness, loss of consciousness, weakness and headaches.  Endo/Heme/Allergies: Does not bruise/bleed easily.  Psychiatric/Behavioral: Negative for depression and memory loss. The patient is nervous/anxious and has insomnia.      Past Medical History  Diagnosis Date  . Ulcerative colitis   . Hypothyroidism   . Hypertension     pcp   dr Ammiel Guiney   [facility]  . Pneumonia 2008  . Chronic bronchitis 10/31/2011    "prone to it; I have sinus/bronchitis problem probably q yr"  . Multiple skin tears 10/31/2011    "get them very easily; my skin is thin  and I bruise easily"  . Chronic back pain     "my entire spine"  . Edema   . Other and unspecified hyperlipidemia   . Encounter for long-term (current) use of other medications   . Senile osteoporosis   . Spinal stenosis, unspecified region other than cervical   . Unspecified glaucoma   . Hypothyroidism   . Complication of anesthesia 2009    htn with block- for hand surgery   . Anxiety     sometimes   . Rheumatoid arthritis(714.0)     degenerative disc disease     Past Surgical History  Procedure Laterality Date  . Back surgery    . Sinus surgery with instatrak    . Thyroidectomy, partial  1997  . Posterior  fusion lumbar spine  10/31/2011    L2-3; 3-4  . Tonsillectomy      "when I was a small child"  . Functional endoscopic sinus surgery  1988  . Posterior fusion lumbar spine  11/2009    L3-4;  L4-5  . Joint replacement  2009; 2006    joints 2 fingers,rt hand; joint left thumb  . Spine surgery    . Posterior lumbar fusion N/A 08/05/13    T1, T2    Social History:   reports that she has been passively smoking Cigarettes.  She has a 12.5 pack-year smoking history. She has never used smokeless tobacco. She reports that she drinks about 2.4 ounces of alcohol per week. She reports that she does not use illicit drugs.  Family History  Problem Relation Age of Onset  . Heart disease Mother   . Cancer Father     lung    Medications: Patient's Medications  New Prescriptions   No medications on file  Previous Medications   ADALIMUMAB (HUMIRA) 40 MG/0.8ML INJECTION    Inject 0.8 mLs (40 mg total) into the skin every 14 (fourteen) days. Resume ONLY if off all antibiotics   ASPIRIN EC 81 MG TABLET    Take 81 mg by mouth every other day.   BALSALAZIDE (COLAZAL) 750 MG CAPSULE    Take 2,250 mg by mouth 3 (three) times daily.   CALCIUM CARB-CHOLECALCIFEROL (CALCIUM + D3) 600-200 MG-UNIT TABS    Take 1 tablet by mouth daily.   CELEBREX 200 MG CAPSULE    Take 400 mg by mouth daily.    CHOLECALCIFEROL (VITAMIN D3) 1000 UNITS CAPS    Take 1,000 Units by mouth daily.   ESTRADIOL (ESTRACE) 0.5 MG TABLET    Take 0.5 mg by mouth daily.    FOLIC ACID (FOLVITE) 1 MG TABLET    Take 2 mg by mouth daily.   GUAIFENESIN (MUCINEX) 600 MG 12 HR TABLET    Take 1,200 mg by mouth daily as needed (sinus relief).    HYDROCHLOROTHIAZIDE (HYDRODIURIL) 25 MG TABLET    Take 25 mg by mouth daily with breakfast.   LATANOPROST (XALATAN) 0.005 % OPHTHALMIC SOLUTION    Place 1 drop into both eyes at bedtime.   LEFLUNOMIDE (ARAVA) 10 MG TABLET    Take 20 mg by mouth daily.    LEVOTHYROXINE (SYNTHROID, LEVOTHROID) 88 MCG TABLET     Take 88 mcg by mouth daily before breakfast.   MEDROXYPROGESTERONE (PROVERA) 2.5 MG TABLET    Take 2.5 mg by mouth at bedtime.    MULTIPLE VITAMIN (MULTIVITAMIN WITH MINERALS) TABS    Take 1 tablet by mouth daily.   OMEGA-3 FATTY ACIDS (FISH OIL) 1200 MG CAPS  Take 1,200 mg by mouth daily.   PREDNISONE (DELTASONE) 5 MG TABLET    Take 5 mg by mouth daily with breakfast.   Modified Medications   No medications on file  Discontinued Medications   DIAZEPAM (VALIUM) 5 MG TABLET    Take 1 tablet (5 mg total) by mouth every 6 (six) hours as needed. For muscle spasms.   HYDROCODONE-ACETAMINOPHEN (NORCO/VICODIN) 5-325 MG PER TABLET    Take 1 tablet by mouth every 6 (six) hours as needed (pain).    OXYCODONE-ACETAMINOPHEN (PERCOCET/ROXICET) 5-325 MG PER TABLET    Take 1 tablet by mouth every 6 (six) hours as needed (pain).     Physical Exam: Filed Vitals:   09/08/13 1031  BP: 120/70  Pulse: 81  Temp: 98.4 F (36.9 C)  TempSrc: Oral  Resp: 18  Height: 5' 6.7" (1.694 m)  Weight: 125 lb (56.7 kg)  SpO2: 97%  Physical Exam  Constitutional: She is oriented to person, place, and time. No distress.  Thin female, nad  HENT:  Head: Normocephalic and atraumatic.  Cardiovascular: Normal rate, regular rhythm, normal heart sounds and intact distal pulses.   Pitting edema of bilateral ankles, feet, none in legs  Pulmonary/Chest: Effort normal and breath sounds normal. No respiratory distress. She has no rales.  Abdominal: Soft. Bowel sounds are normal. She exhibits no distension and no mass. There is no tenderness.  Musculoskeletal:  Feet splayed out when walking;  Has limp, but otherwise walking much better today and w/o assistive device; wearing brace on low back  Neurological: She is alert and oriented to person, place, and time.  Skin: Skin is warm and dry.  Multiple echymoses, also has chronic venous stasis skin changes anterior shins  Psychiatric: She has a normal mood and affect.      Labs reviewed: Basic Metabolic Panel:  Recent Labs  03/14/13 0832  07/31/13 1609 08/06/13 0500 08/27/13 1506  NA  --   < > 140 138 143  K  --   < > 3.8 3.4* 4.9  CL  --   < > 103 103 102  CO2  --   < > 25 24 24   GLUCOSE  --   < > 80 94 140*  BUN  --   < > 24* 22 26  CREATININE  --   < > 0.76 0.81 0.91  CALCIUM  --   < > 9.5 8.1* 9.6  TSH 1.060  --   --   --  0.596  < > = values in this interval not displayed. CBC:  Recent Labs  07/31/13 1609 08/06/13 0500 08/27/13 1506  WBC 7.5 7.2 5.2  NEUTROABS  --   --  3.8  HGB 13.1 10.5* 11.5  HCT 40.5 31.5* 34.7  MCV 92.9 92.9 91  PLT 241 179 314   Lipid Panel:  Recent Labs  03/14/13 0832  HDL 105  LDLCALC 154*  TRIG 110  CHOLHDL 2.7   Assessment/Plan 1. Ulcerative colitis, without complications - has had a flare-up since being off of her humira injection -is going to get this a few days early to help with it and so she can go on her next trip -I suspect this is responsible for her weight loss and decreased appetite, as well - CBC With differential/Platelet; Future - Comprehensive metabolic panel; Future -cont leflunomide and balsalazide 2. Lumbar stenosis with neurogenic claudication -has had back surgery again and doing better since - Ambulatory referral to Podiatry as pt cannot trim  her own nails  3. Spinal stenosis in cervical region -neck pain present, but tolerating at this point -has plans for future neck surgery, as well with Dr. Ellene Route  4. Essential hypertension, benign -bp is at goal with current regimen  5. Edema -seems likely chronic venous stasis related, but will check ABIs to assess arterial circulation b/c she would benefit from compression if her arterial circulation is satisfactory;  She is at risk for arterial occlusion due to her inflammatory diseases, smoking and hyperlipidemia  6. Rheumatoid arthritis(714.0) -cont humira - Ambulatory referral to Podiatry due to foot deformities  cannot trim her own nails  7. Postoperative hypothyroidism - cont current synthroid - TSH; Future  8. Chronic venous stasis dermatitis of both lower extremities - recommend elevation at rest and will plan on teds if dopplers normal - Lower Extremity Arterial Doppler Bilateral; Future  9. Hyperlipidemia LDL goal <100 -resume asa - Lipid panel; Future -previously did not want statin therapy, but RA as CAD equivalent reviewed as well as her other risk factors with her  Labs/tests ordered: Orders Placed This Encounter  Procedures  . CBC With differential/Platelet    Standing Status: Future     Number of Occurrences:      Standing Expiration Date: 09/09/2014  . Comprehensive metabolic panel    Standing Status: Future     Number of Occurrences:      Standing Expiration Date: 09/09/2014  . TSH    Standing Status: Future     Number of Occurrences:      Standing Expiration Date: 09/09/2014  . Lipid panel    Standing Status: Future     Number of Occurrences:      Standing Expiration Date: 09/09/2014  . Ambulatory referral to Podiatry    Referral Priority:  Routine    Referral Type:  Consultation    Referral Reason:  Specialty Services Required    Requested Specialty:  Podiatry    Number of Visits Requested:  1  . Lower Extremity Arterial Doppler Bilateral    With ABIs to assess arterial circulation, has h/o tobacco abuse, cold feet at night, new edema    Standing Status: Future     Number of Occurrences:      Standing Expiration Date: 09/08/2014    Scheduling Instructions:     In September    Order Specific Question:  Laterality    Answer:  Bilateral    Order Specific Question:  Where should this test be performed:    Answer:  [facility]    Next appt:  6 mos with labs before and Arterial doppler with abis in Sept

## 2013-09-19 ENCOUNTER — Other Ambulatory Visit: Payer: Self-pay | Admitting: Internal Medicine

## 2013-09-19 DIAGNOSIS — R209 Unspecified disturbances of skin sensation: Secondary | ICD-10-CM

## 2013-09-19 DIAGNOSIS — R252 Cramp and spasm: Secondary | ICD-10-CM

## 2013-09-19 DIAGNOSIS — R609 Edema, unspecified: Secondary | ICD-10-CM

## 2013-09-19 NOTE — Addendum Note (Signed)
Addended by: Rafael Bihari A on: 09/19/2013 11:44 AM   Modules accepted: Orders

## 2013-09-22 ENCOUNTER — Other Ambulatory Visit: Payer: Self-pay | Admitting: Internal Medicine

## 2013-09-22 ENCOUNTER — Ambulatory Visit (HOSPITAL_COMMUNITY)
Admission: RE | Admit: 2013-09-22 | Discharge: 2013-09-22 | Disposition: A | Discharge status: Home / Self Care | Payer: Medicare Other | Source: Ambulatory Visit | Attending: Vascular Surgery | Admitting: Vascular Surgery

## 2013-09-22 ENCOUNTER — Other Ambulatory Visit (HOSPITAL_COMMUNITY): Payer: Self-pay | Admitting: Family Medicine

## 2013-09-22 DIAGNOSIS — I739 Peripheral vascular disease, unspecified: Secondary | ICD-10-CM | POA: Insufficient documentation

## 2013-09-22 DIAGNOSIS — R52 Pain, unspecified: Secondary | ICD-10-CM

## 2013-10-01 ENCOUNTER — Telehealth: Payer: Self-pay | Admitting: *Deleted

## 2013-10-01 DIAGNOSIS — I872 Venous insufficiency (chronic) (peripheral): Secondary | ICD-10-CM

## 2013-10-01 DIAGNOSIS — M79605 Pain in left leg: Secondary | ICD-10-CM

## 2013-10-01 DIAGNOSIS — M79604 Pain in right leg: Secondary | ICD-10-CM

## 2013-10-01 NOTE — Telephone Encounter (Signed)
Patient called and stated that you sent her for Vascular and Vein Tests and patient wants results from this. States that her symptoms are only getting worse. Please Advise.

## 2013-10-02 NOTE — Telephone Encounter (Signed)
Results were normal.  Unfortunately, they did not come to my inbasket so I had no idea they had returned.  I apologize.  If she is not having any new symptoms with her cramping of the legs, I suspect that the cramps are coming from her low back.

## 2013-10-02 NOTE — Telephone Encounter (Signed)
Left message on voicemail for patient to return call when available   

## 2013-10-03 NOTE — Telephone Encounter (Signed)
Spoke with patient, patient verbalized understanding of results. Patient states she would like to know if a venous study should be ordered. Patient still with symptoms. Patient aware Dr.[name] is out of the office and we will respond on Monday.

## 2013-10-06 ENCOUNTER — Encounter: Payer: Self-pay | Admitting: Podiatry

## 2013-10-06 ENCOUNTER — Ambulatory Visit (INDEPENDENT_AMBULATORY_CARE_PROVIDER_SITE_OTHER): Payer: Medicare Other | Admitting: Podiatry

## 2013-10-06 VITALS — BP 158/80 | HR 60 | Resp 14 | Ht 66.0 in | Wt 126.0 lb

## 2013-10-06 DIAGNOSIS — B351 Tinea unguium: Secondary | ICD-10-CM

## 2013-10-06 NOTE — Telephone Encounter (Signed)
Let's do bilateral venous doppler studies also due to chronic venous insufficiency and leg pain

## 2013-10-06 NOTE — Progress Notes (Signed)
   Subjective:    Patient ID: [name], female    DOB: [birth year], 79 y.o.   MRN: [id number]  HPI Comments: N debridement L 10 toenails D and O long-term C elongated toenails A difficulty trimming, back problems T hx of TFC pedicures over 3 years ago     Review of Systems  HENT: Positive for congestion.   Gastrointestinal:       Ullcerative colitis  Musculoskeletal: Positive for back pain.  All other systems reviewed and are negative.      Objective:   Physical Exam  Orientated x3 white female  Vascular: DP and PT pulses 2/4 bilaterally  Neurological: Sensation to 10 g monofilament wire intact 5/5 bilaterally Vibratory sensation nonreactive bilaterally Ankle reflex equal and reactive bilaterally  Dermatological: The nails are incurvated, discolored, and brittle 6-10  Musculoskeletal: Pes planus bilaterally HAV deformities bilaterally Hammertoe deformities 2-5 bilaterally        Assessment & Plan:   Assessment: Asymptomatic onychomycoses 6-10  Plan: Nails x10 debrided without bleeding.  Reappoint at patient's request

## 2013-10-07 ENCOUNTER — Encounter: Payer: Self-pay | Admitting: Podiatry

## 2013-10-07 NOTE — Addendum Note (Signed)
Addended by: Logan Bores on: 10/07/2013 04:51 PM   Modules accepted: Orders

## 2013-10-07 NOTE — Telephone Encounter (Signed)
Order placed

## 2013-10-15 ENCOUNTER — Ambulatory Visit (HOSPITAL_COMMUNITY): Payer: Medicare Other | Attending: Internal Medicine | Admitting: *Deleted

## 2013-10-15 DIAGNOSIS — I872 Venous insufficiency (chronic) (peripheral): Secondary | ICD-10-CM

## 2013-10-15 DIAGNOSIS — R609 Edema, unspecified: Secondary | ICD-10-CM

## 2013-10-15 DIAGNOSIS — M79609 Pain in unspecified limb: Secondary | ICD-10-CM

## 2013-10-15 DIAGNOSIS — M79604 Pain in right leg: Secondary | ICD-10-CM

## 2013-10-15 DIAGNOSIS — M79605 Pain in left leg: Secondary | ICD-10-CM

## 2013-10-15 NOTE — Progress Notes (Signed)
Bilateral lower extremity venous complete.  

## 2013-10-21 ENCOUNTER — Telehealth: Payer: Self-pay

## 2013-10-21 NOTE — Telephone Encounter (Signed)
Left message on voicemail for patient to return call when available   

## 2013-10-21 NOTE — Telephone Encounter (Signed)
Message copied by Logan Bores on Tue Oct 21, 2013 11:03 AM ------      Message from: Hollace Kinnier L      Created: Thu Oct 16, 2013  7:44 AM      Regarding: FW: Venous Prelim       No DVT was identified in her veins.  Please inform her that this is the case.  This does not mean she does not have venous disease as I have suspected.  I recommend she wear compression hose to see if this will help with her pain.              ----- Message -----         From: Glean Salen         Sent: 10/15/2013   3:51 PM           To: Gayland Curry, DO      Subject: Venous Prelim                                            Venous Duplex of the bilateral lower extremities appears negative for acute DVT.             Thank you       ------

## 2013-10-22 NOTE — Telephone Encounter (Signed)
Discussed with patient, patient verbalized understanding of results  

## 2013-11-11 HISTORY — PX: CATARACT EXTRACTION: SUR2

## 2013-12-09 ENCOUNTER — Other Ambulatory Visit: Payer: Self-pay | Admitting: Internal Medicine

## 2013-12-30 HISTORY — PX: CATARACT EXTRACTION: SUR2

## 2013-12-31 ENCOUNTER — Other Ambulatory Visit: Payer: Self-pay | Admitting: Neurological Surgery

## 2014-01-11 ENCOUNTER — Other Ambulatory Visit: Payer: Self-pay | Admitting: Internal Medicine

## 2014-02-10 ENCOUNTER — Encounter (HOSPITAL_COMMUNITY)
Admission: RE | Admit: 2014-02-10 | Discharge: 2014-02-10 | Disposition: A | Discharge status: Home / Self Care | Payer: Medicare Other | Source: Ambulatory Visit | Attending: Neurological Surgery | Admitting: Neurological Surgery

## 2014-02-10 ENCOUNTER — Encounter (HOSPITAL_COMMUNITY): Payer: Self-pay

## 2014-02-10 DIAGNOSIS — M0689 Other specified rheumatoid arthritis, multiple sites: Secondary | ICD-10-CM | POA: Diagnosis not present

## 2014-02-10 DIAGNOSIS — M542 Cervicalgia: Secondary | ICD-10-CM | POA: Diagnosis present

## 2014-02-10 DIAGNOSIS — I10 Essential (primary) hypertension: Secondary | ICD-10-CM | POA: Diagnosis not present

## 2014-02-10 DIAGNOSIS — Z7982 Long term (current) use of aspirin: Secondary | ICD-10-CM | POA: Diagnosis not present

## 2014-02-10 DIAGNOSIS — M4712 Other spondylosis with myelopathy, cervical region: Secondary | ICD-10-CM | POA: Diagnosis not present

## 2014-02-10 DIAGNOSIS — E039 Hypothyroidism, unspecified: Secondary | ICD-10-CM | POA: Diagnosis not present

## 2014-02-10 LAB — BASIC METABOLIC PANEL
Anion gap: 12 (ref 5–15)
BUN: 29 mg/dL — ABNORMAL HIGH (ref 6–23)
CO2: 26 mEq/L (ref 19–32)
Calcium: 9.4 mg/dL (ref 8.4–10.5)
Chloride: 102 mEq/L (ref 96–112)
Creatinine, Ser: 0.65 mg/dL (ref 0.50–1.10)
GFR, EST NON AFRICAN AMERICAN: 83 mL/min — AB (ref >90)
Glucose, Bld: 86 mg/dL (ref 70–99)
POTASSIUM: 4.3 meq/L (ref 3.7–5.3)
SODIUM: 140 meq/L (ref 137–147)

## 2014-02-10 LAB — CBC
HEMATOCRIT: 42.1 % (ref 36.0–46.0)
Hemoglobin: 13.5 g/dL (ref 12.0–15.0)
MCH: 29.9 pg (ref 26.0–34.0)
MCHC: 32.1 g/dL (ref 30.0–36.0)
MCV: 93.3 fL (ref 78.0–100.0)
PLATELETS: 195 10*3/uL (ref 150–400)
RBC: 4.51 MIL/uL (ref 3.87–5.11)
RDW: 14.5 % (ref 11.5–15.5)
WBC: 7.8 10*3/uL (ref 4.0–10.5)

## 2014-02-10 LAB — SURGICAL PCR SCREEN
MRSA, PCR: NEGATIVE
STAPHYLOCOCCUS AUREUS: NEGATIVE

## 2014-02-10 NOTE — Pre-Procedure Instructions (Signed)
[name]  02/10/2014   Your procedure is scheduled on:  02/17/14  Report to [facility] Admitting at 715 AM.  Call this number if you have problems the morning of surgery: [phone]   Remember:   Do not eat food or drink liquids after midnight.   Take these medicines the morning of surgery with A SIP OF WATER: estrace,hydrocodone,synthroid   Do not wear jewelry, make-up or nail polish.  Do not wear lotions, powders, or perfumes. You may wear deodorant.  Do not shave 48 hours prior to surgery. Men may shave face and neck.  Do not bring valuables to the hospital.  [facility] is not responsible                  for any belongings or valuables.               Contacts, dentures or bridgework may not be worn into surgery.  Leave suitcase in the car. After surgery it may be brought to your room.  For patients admitted to the hospital, discharge time is determined by your                treatment team.               Patients discharged the day of surgery will not be allowed to drive  home.  Name and phone number of your driver:   Special Instructions: Incentive Spirometry - Practice and bring it with you on the day of surgery.   Please read over the following fact sheets that you were given: Pain Booklet, Coughing and Deep Breathing and Surgical Site Infection Prevention

## 2014-02-16 MED ORDER — CEFAZOLIN SODIUM-DEXTROSE 2-3 GM-% IV SOLR
2.0000 g | INTRAVENOUS | Status: AC
  Administered 2014-02-17: 2 g via INTRAVENOUS

## 2014-02-17 ENCOUNTER — Inpatient Hospital Stay (HOSPITAL_COMMUNITY): Payer: Medicare Other

## 2014-02-17 ENCOUNTER — Inpatient Hospital Stay (HOSPITAL_COMMUNITY): Payer: Medicare Other | Admitting: Anesthesiology

## 2014-02-17 ENCOUNTER — Encounter (HOSPITAL_COMMUNITY)
Admission: RE | Disposition: A | Discharge status: Home / Self Care | Payer: Self-pay | Source: Ambulatory Visit | Attending: Neurological Surgery

## 2014-02-17 ENCOUNTER — Observation Stay (HOSPITAL_COMMUNITY)
Admission: RE | Admit: 2014-02-17 | Discharge: 2014-02-18 | Disposition: A | Discharge status: Home / Self Care | Payer: Medicare Other | Source: Ambulatory Visit | Attending: Neurological Surgery | Admitting: Neurological Surgery

## 2014-02-17 ENCOUNTER — Encounter (HOSPITAL_COMMUNITY): Payer: Self-pay | Admitting: Certified Registered"

## 2014-02-17 DIAGNOSIS — E039 Hypothyroidism, unspecified: Secondary | ICD-10-CM | POA: Insufficient documentation

## 2014-02-17 DIAGNOSIS — M0689 Other specified rheumatoid arthritis, multiple sites: Secondary | ICD-10-CM | POA: Insufficient documentation

## 2014-02-17 DIAGNOSIS — M4712 Other spondylosis with myelopathy, cervical region: Secondary | ICD-10-CM | POA: Diagnosis not present

## 2014-02-17 DIAGNOSIS — I10 Essential (primary) hypertension: Secondary | ICD-10-CM | POA: Insufficient documentation

## 2014-02-17 DIAGNOSIS — Z7982 Long term (current) use of aspirin: Secondary | ICD-10-CM | POA: Insufficient documentation

## 2014-02-17 DIAGNOSIS — M47812 Spondylosis without myelopathy or radiculopathy, cervical region: Secondary | ICD-10-CM

## 2014-02-17 HISTORY — PX: ANTERIOR CERVICAL DECOMP/DISCECTOMY FUSION: SHX1161

## 2014-02-17 SURGERY — ANTERIOR CERVICAL DECOMPRESSION/DISCECTOMY FUSION 1 LEVEL
Anesthesia: General

## 2014-02-17 MED ORDER — GLYCOPYRROLATE 0.2 MG/ML IJ SOLN
INTRAMUSCULAR | Status: DC | PRN
  Administered 2014-02-17: 0.4 mg via INTRAVENOUS
  Administered 2014-02-17: 0.2 mg via INTRAVENOUS

## 2014-02-17 MED ORDER — OXYCODONE-ACETAMINOPHEN 5-325 MG PO TABS
1.0000 | ORAL_TABLET | ORAL | Status: DC | PRN
Start: 2014-02-17 — End: 2014-02-18
  Administered 2014-02-17: 2 via ORAL
  Administered 2014-02-18 (×2): 1 via ORAL
  Filled 2014-02-17: qty 2
  Filled 2014-02-17 (×2): qty 1

## 2014-02-17 MED ORDER — LACTATED RINGERS IV SOLN
INTRAVENOUS | Status: DC | PRN
  Administered 2014-02-17 (×2): via INTRAVENOUS

## 2014-02-17 MED ORDER — ROCURONIUM BROMIDE 50 MG/5ML IV SOLN
INTRAVENOUS | Status: AC
  Filled 2014-02-17: qty 1

## 2014-02-17 MED ORDER — MEDROXYPROGESTERONE ACETATE 2.5 MG PO TABS
2.5000 mg | ORAL_TABLET | Freq: Every day | ORAL | Status: DC
Start: 2014-02-17 — End: 2014-02-18
  Administered 2014-02-17: 2.5 mg via ORAL
  Filled 2014-02-17 (×2): qty 1

## 2014-02-17 MED ORDER — THROMBIN 5000 UNITS EX SOLR
CUTANEOUS | Status: DC | PRN
  Administered 2014-02-17 (×2): 5000 [IU] via TOPICAL

## 2014-02-17 MED ORDER — PHENOL 1.4 % MT LIQD: 1.0000 | OROMUCOSAL | Status: DC | PRN

## 2014-02-17 MED ORDER — PROPOFOL 10 MG/ML IV BOLUS
INTRAVENOUS | Status: DC | PRN
  Administered 2014-02-17: 180 mg via INTRAVENOUS

## 2014-02-17 MED ORDER — METHOCARBAMOL 500 MG PO TABS
500.0000 mg | ORAL_TABLET | Freq: Four times a day (QID) | ORAL | Status: DC | PRN
Start: 2014-02-17 — End: 2014-02-18
  Administered 2014-02-17 – 2014-02-18 (×3): 500 mg via ORAL
  Filled 2014-02-17 (×2): qty 1

## 2014-02-17 MED ORDER — MORPHINE SULFATE 2 MG/ML IJ SOLN: 1.0000 mg | INTRAMUSCULAR | Status: DC | PRN

## 2014-02-17 MED ORDER — LIDOCAINE-EPINEPHRINE 1 %-1:100000 IJ SOLN
INTRAMUSCULAR | Status: DC | PRN
  Administered 2014-02-17: 3 mL

## 2014-02-17 MED ORDER — METHOCARBAMOL 1000 MG/10ML IJ SOLN: 500.0000 mg | Freq: Four times a day (QID) | INTRAVENOUS | Status: DC | PRN

## 2014-02-17 MED ORDER — SUCCINYLCHOLINE CHLORIDE 20 MG/ML IJ SOLN
INTRAMUSCULAR | Status: AC
  Filled 2014-02-17: qty 1

## 2014-02-17 MED ORDER — DIAZEPAM 5 MG PO TABS: 5.0000 mg | ORAL_TABLET | Freq: Four times a day (QID) | ORAL | Status: DC | PRN

## 2014-02-17 MED ORDER — LEFLUNOMIDE 10 MG PO TABS
20.0000 mg | ORAL_TABLET | Freq: Every day | ORAL | Status: DC
  Administered 2014-02-17: 20 mg via ORAL
  Filled 2014-02-17 (×2): qty 2

## 2014-02-17 MED ORDER — HYDROCODONE-ACETAMINOPHEN 5-325 MG PO TABS: 1.0000 | ORAL_TABLET | ORAL | Status: DC | PRN

## 2014-02-17 MED ORDER — ALUM & MAG HYDROXIDE-SIMETH 200-200-20 MG/5ML PO SUSP: 30.0000 mL | Freq: Four times a day (QID) | ORAL | Status: DC | PRN

## 2014-02-17 MED ORDER — SODIUM CHLORIDE 0.9 % IJ SOLN: 3.0000 mL | INTRAMUSCULAR | Status: DC | PRN

## 2014-02-17 MED ORDER — BALSALAZIDE DISODIUM 750 MG PO CAPS
1500.0000 mg | ORAL_CAPSULE | Freq: Three times a day (TID) | ORAL | Status: DC
  Administered 2014-02-17 (×2): 1500 mg via ORAL
  Filled 2014-02-17 (×5): qty 2

## 2014-02-17 MED ORDER — ONDANSETRON HCL 4 MG/2ML IJ SOLN
INTRAMUSCULAR | Status: AC
  Filled 2014-02-17: qty 2

## 2014-02-17 MED ORDER — LACTATED RINGERS IV SOLN
INTRAVENOUS | Status: DC
  Administered 2014-02-17: 07:00:00 via INTRAVENOUS

## 2014-02-17 MED ORDER — ONDANSETRON HCL 4 MG/2ML IJ SOLN: 4.0000 mg | INTRAMUSCULAR | Status: DC | PRN

## 2014-02-17 MED ORDER — DEXAMETHASONE SODIUM PHOSPHATE 10 MG/ML IJ SOLN
INTRAMUSCULAR | Status: DC | PRN
  Administered 2014-02-17: 4 mg via INTRAVENOUS

## 2014-02-17 MED ORDER — LEVOTHYROXINE SODIUM 88 MCG PO TABS
88.0000 ug | ORAL_TABLET | Freq: Every day | ORAL | Status: DC
  Administered 2014-02-18: 88 ug via ORAL
  Filled 2014-02-17 (×2): qty 1

## 2014-02-17 MED ORDER — HYDROCHLOROTHIAZIDE 25 MG PO TABS
25.0000 mg | ORAL_TABLET | Freq: Every day | ORAL | Status: DC
  Filled 2014-02-17 (×2): qty 1

## 2014-02-17 MED ORDER — HEMOSTATIC AGENTS (NO CHARGE) OPTIME
TOPICAL | Status: DC | PRN
  Administered 2014-02-17: 1 via TOPICAL

## 2014-02-17 MED ORDER — LIDOCAINE HCL (CARDIAC) 20 MG/ML IV SOLN
INTRAVENOUS | Status: DC | PRN
  Administered 2014-02-17: 80 mg via INTRAVENOUS

## 2014-02-17 MED ORDER — LATANOPROST 0.005 % OP SOLN
1.0000 [drp] | Freq: Every day | OPHTHALMIC | Status: DC
  Filled 2014-02-17: qty 2.5

## 2014-02-17 MED ORDER — SODIUM CHLORIDE 0.9 % IJ SOLN
3.0000 mL | Freq: Two times a day (BID) | INTRAMUSCULAR | Status: DC
  Administered 2014-02-17 (×2): 3 mL via INTRAVENOUS

## 2014-02-17 MED ORDER — 0.9 % SODIUM CHLORIDE (POUR BTL) OPTIME
TOPICAL | Status: DC | PRN
  Administered 2014-02-17: 1000 mL

## 2014-02-17 MED ORDER — BUPIVACAINE HCL (PF) 0.25 % IJ SOLN
INTRAMUSCULAR | Status: DC | PRN
  Administered 2014-02-17: 3 mL

## 2014-02-17 MED ORDER — HYDROMORPHONE HCL 1 MG/ML IJ SOLN
INTRAMUSCULAR | Status: AC
  Filled 2014-02-17: qty 2

## 2014-02-17 MED ORDER — HYDROMORPHONE HCL 1 MG/ML IJ SOLN
0.2500 mg | INTRAMUSCULAR | Status: DC | PRN
  Administered 2014-02-17 (×2): 0.5 mg via INTRAVENOUS

## 2014-02-17 MED ORDER — DEXAMETHASONE SODIUM PHOSPHATE 4 MG/ML IJ SOLN
INTRAMUSCULAR | Status: AC
  Filled 2014-02-17: qty 1

## 2014-02-17 MED ORDER — FENTANYL CITRATE 0.05 MG/ML IJ SOLN
INTRAMUSCULAR | Status: AC
  Filled 2014-02-17: qty 5

## 2014-02-17 MED ORDER — FENTANYL CITRATE 0.05 MG/ML IJ SOLN
INTRAMUSCULAR | Status: DC | PRN
  Administered 2014-02-17: 150 ug via INTRAVENOUS
  Administered 2014-02-17 (×2): 50 ug via INTRAVENOUS

## 2014-02-17 MED ORDER — CEFAZOLIN SODIUM 1-5 GM-% IV SOLN
1.0000 g | Freq: Three times a day (TID) | INTRAVENOUS | Status: AC
Start: 2014-02-17 — End: 2014-02-18
  Administered 2014-02-17 – 2014-02-18 (×2): 1 g via INTRAVENOUS
  Filled 2014-02-17 (×2): qty 50

## 2014-02-17 MED ORDER — NEOSTIGMINE METHYLSULFATE 10 MG/10ML IV SOLN
INTRAVENOUS | Status: DC | PRN
  Administered 2014-02-17: 3 mg via INTRAVENOUS

## 2014-02-17 MED ORDER — OXYCODONE-ACETAMINOPHEN 5-325 MG PO TABS: 1.0000 | ORAL_TABLET | ORAL | Status: DC | PRN

## 2014-02-17 MED ORDER — MENTHOL 3 MG MT LOZG: 1.0000 | LOZENGE | OROMUCOSAL | Status: DC | PRN

## 2014-02-17 MED ORDER — HYDROCODONE-ACETAMINOPHEN 5-325 MG PO TABS: 1.0000 | ORAL_TABLET | Freq: Four times a day (QID) | ORAL | Status: DC | PRN

## 2014-02-17 MED ORDER — ROCURONIUM BROMIDE 100 MG/10ML IV SOLN
INTRAVENOUS | Status: DC | PRN
  Administered 2014-02-17: 40 mg via INTRAVENOUS

## 2014-02-17 MED ORDER — NEOSTIGMINE METHYLSULFATE 10 MG/10ML IV SOLN
INTRAVENOUS | Status: AC
  Filled 2014-02-17: qty 1

## 2014-02-17 MED ORDER — PROPOFOL 10 MG/ML IV BOLUS
INTRAVENOUS | Status: AC
  Filled 2014-02-17: qty 20

## 2014-02-17 MED ORDER — PREDNISONE 5 MG PO TABS
5.0000 mg | ORAL_TABLET | Freq: Every day | ORAL | Status: DC
Start: 2014-02-18 — End: 2014-02-18
  Filled 2014-02-17 (×2): qty 1

## 2014-02-17 MED ORDER — HYDROMORPHONE HCL 1 MG/ML IJ SOLN: 0.2500 mg | INTRAMUSCULAR | Status: DC | PRN

## 2014-02-17 MED ORDER — LEVOTHYROXINE SODIUM 88 MCG PO TABS: 88.0000 ug | ORAL_TABLET | Freq: Every day | ORAL | Status: DC

## 2014-02-17 MED ORDER — SENNOSIDES-DOCUSATE SODIUM 8.6-50 MG PO TABS
1.0000 | ORAL_TABLET | Freq: Two times a day (BID) | ORAL | Status: DC
  Administered 2014-02-17: 1 via ORAL
  Filled 2014-02-17 (×3): qty 1

## 2014-02-17 MED ORDER — ONDANSETRON HCL 4 MG/2ML IJ SOLN
INTRAMUSCULAR | Status: DC | PRN
  Administered 2014-02-17: 4 mg via INTRAVENOUS

## 2014-02-17 MED ORDER — ACETAMINOPHEN 325 MG PO TABS: 650.0000 mg | ORAL_TABLET | ORAL | Status: DC | PRN

## 2014-02-17 MED ORDER — ESTRADIOL 1 MG PO TABS
0.5000 mg | ORAL_TABLET | Freq: Every day | ORAL | Status: DC
  Administered 2014-02-17: 0.5 mg via ORAL
  Filled 2014-02-17 (×2): qty 0.5

## 2014-02-17 MED ORDER — ACETAMINOPHEN 650 MG RE SUPP: 650.0000 mg | RECTAL | Status: DC | PRN

## 2014-02-17 MED ORDER — METHOCARBAMOL 500 MG PO TABS
ORAL_TABLET | ORAL | Status: AC
  Filled 2014-02-17: qty 1

## 2014-02-17 SURGICAL SUPPLY — 62 items
ALLOGRAFT LORDOTIC 8X11X14 (Bone Implant) ×2 IMPLANT
BAG DECANTER FOR FLEXI CONT (MISCELLANEOUS) ×3 IMPLANT
BIT DRILL NEURO 2X3.1 SFT TUCH (MISCELLANEOUS) ×1 IMPLANT
BIT DRILL POWER (BIT) IMPLANT
BNDG GAUZE ELAST 4 BULKY (GAUZE/BANDAGES/DRESSINGS) IMPLANT
BUR BARREL STRAIGHT FLUTE 4.0 (BURR) ×3 IMPLANT
CANISTER SUCT 3000ML (MISCELLANEOUS) ×3 IMPLANT
CONT SPEC 4OZ CLIKSEAL STRL BL (MISCELLANEOUS) ×6 IMPLANT
DECANTER SPIKE VIAL GLASS SM (MISCELLANEOUS) ×3 IMPLANT
DRAPE LAPAROTOMY 100X72 PEDS (DRAPES) ×3 IMPLANT
DRAPE MICROSCOPE LEICA (MISCELLANEOUS) IMPLANT
DRAPE POUCH INSTRU U-SHP 10X18 (DRAPES) ×3 IMPLANT
DRILL BIT POWER (BIT) ×3
DRILL NEURO 2X3.1 SOFT TOUCH (MISCELLANEOUS) ×3
DRSG OPSITE 4X5.5 SM (GAUZE/BANDAGES/DRESSINGS) ×1 IMPLANT
DRSG TELFA 3X8 NADH (GAUZE/BANDAGES/DRESSINGS) IMPLANT
DURAPREP 6ML APPLICATOR 50/CS (WOUND CARE) ×3 IMPLANT
ELECT REM PT RETURN 9FT ADLT (ELECTROSURGICAL) ×3
ELECTRODE REM PT RTRN 9FT ADLT (ELECTROSURGICAL) ×1 IMPLANT
GAUZE SPONGE 4X4 16PLY XRAY LF (GAUZE/BANDAGES/DRESSINGS) IMPLANT
GLOVE BIO SURGEON STRL SZ7.5 (GLOVE) IMPLANT
GLOVE BIOGEL PI IND STRL 7.0 (GLOVE) IMPLANT
GLOVE BIOGEL PI IND STRL 7.5 (GLOVE) IMPLANT
GLOVE BIOGEL PI IND STRL 8.5 (GLOVE) ×1 IMPLANT
GLOVE BIOGEL PI INDICATOR 7.0 (GLOVE) ×2
GLOVE BIOGEL PI INDICATOR 7.5 (GLOVE)
GLOVE BIOGEL PI INDICATOR 8.5 (GLOVE) ×2
GLOVE ECLIPSE 8.5 STRL (GLOVE) ×3 IMPLANT
GLOVE ECLIPSE 9.0 STRL (GLOVE) ×2 IMPLANT
GLOVE EXAM NITRILE LRG STRL (GLOVE) IMPLANT
GLOVE EXAM NITRILE MD LF STRL (GLOVE) IMPLANT
GLOVE EXAM NITRILE XL STR (GLOVE) IMPLANT
GLOVE EXAM NITRILE XS STR PU (GLOVE) IMPLANT
GLOVE SS BIOGEL STRL SZ 6.5 (GLOVE) IMPLANT
GLOVE SUPERSENSE BIOGEL SZ 6.5 (GLOVE) ×4
GOWN STRL REUS W/ TWL LRG LVL3 (GOWN DISPOSABLE) IMPLANT
GOWN STRL REUS W/ TWL XL LVL3 (GOWN DISPOSABLE) ×1 IMPLANT
GOWN STRL REUS W/TWL 2XL LVL3 (GOWN DISPOSABLE) ×3 IMPLANT
GOWN STRL REUS W/TWL LRG LVL3 (GOWN DISPOSABLE) ×3
GOWN STRL REUS W/TWL XL LVL3 (GOWN DISPOSABLE) ×6
HALTER HD/CHIN CERV TRACTION D (MISCELLANEOUS) ×3 IMPLANT
KIT BASIN OR (CUSTOM PROCEDURE TRAY) ×3 IMPLANT
KIT ROOM TURNOVER OR (KITS) ×3 IMPLANT
LIQUID BAND (GAUZE/BANDAGES/DRESSINGS) ×3 IMPLANT
NDL SPNL 22GX3.5 QUINCKE BK (NEEDLE) ×1 IMPLANT
NEEDLE HYPO 22GX1.5 SAFETY (NEEDLE) ×3 IMPLANT
NEEDLE SPNL 22GX3.5 QUINCKE BK (NEEDLE) ×3 IMPLANT
NS IRRIG 1000ML POUR BTL (IV SOLUTION) ×3 IMPLANT
PACK LAMINECTOMY NEURO (CUSTOM PROCEDURE TRAY) ×3 IMPLANT
PAD ARMBOARD 7.5X6 YLW CONV (MISCELLANEOUS) ×9 IMPLANT
PAD DRESSING TELFA 3X8 NADH (GAUZE/BANDAGES/DRESSINGS) ×1 IMPLANT
PLATE ARCHON 1-LEVEL 22MM (Plate) ×2 IMPLANT
RUBBERBAND STERILE (MISCELLANEOUS) ×4 IMPLANT
SCREW ARCHON ST VAR 4.0X15MM (Screw) ×12 IMPLANT
SCREW BN 15X4XST VA NS SPN (Screw) IMPLANT
SPONGE INTESTINAL PEANUT (DISPOSABLE) ×3 IMPLANT
SPONGE SURGIFOAM ABS GEL SZ50 (HEMOSTASIS) ×3 IMPLANT
SUT VIC AB 3-0 SH 8-18 (SUTURE) ×5 IMPLANT
SYR 20ML ECCENTRIC (SYRINGE) ×3 IMPLANT
TOWEL OR 17X24 6PK STRL BLUE (TOWEL DISPOSABLE) ×3 IMPLANT
TOWEL OR 17X26 10 PK STRL BLUE (TOWEL DISPOSABLE) ×3 IMPLANT
WATER STERILE IRR 1000ML POUR (IV SOLUTION) ×3 IMPLANT

## 2014-02-17 NOTE — Transfer of Care (Signed)
Immediate Anesthesia Transfer of Care Note  [name]  Procedure(s) Performed: Procedure(s) with comments: CERVICAL FOUR TO FIVE ANTERIOR CERVICAL DECOMPRESSION/DISCECTOMY FUSION 1 LEVEL (N/A) - C4-5 Anterior cervical decompression/diskectomy/fusion  Patient Location: PACU  Anesthesia Type:General  Level of Consciousness: awake, alert  and oriented  Airway & Oxygen Therapy: Patient connected to face mask oxygen  Post-op Assessment: Post -op Vital signs reviewed and stable  Post vital signs: stable  Complications: No apparent anesthesia complications

## 2014-02-17 NOTE — Evaluation (Signed)
Physical Therapy Evaluation Patient Details Name: [name] MRN: [id number] DOB: October 11, 1934 Today's Date: 02/17/2014   History of Present Illness  pt admitted with cervical spondylosis of C45 with radiculopathy, s/p ACDF  Clinical Impression  Pt admitted with/for management of C45 spondylosis and radiculopathy with ACDF.  Pt currently limited functionally due to the problems listed below.  (see problems list.)  Pt will benefit from PT to maximize function and safety to be able to get home safely with available assist of family .     Follow Up Recommendations No PT follow up    Equipment Recommendations  None recommended by PT    Recommendations for Other Services       Precautions / Restrictions Precautions Precautions: Cervical Restrictions Weight Bearing Restrictions: No      Mobility  Bed Mobility Overal bed mobility: Needs Assistance Bed Mobility: Rolling;Sidelying to Sit;Sit to Sidelying Rolling: Min guard (with rail) Sidelying to sit: Min assist     Sit to sidelying: Min assist General bed mobility comments: cues for best technique, pt tends to come straight up insteady of rolling to the side  Transfers Overall transfer level: Needs assistance Equipment used: None Transfers: Sit to/from Stand Sit to Stand: Min guard         General transfer comment: cues for hand placement  Ambulation/Gait Ambulation/Gait assistance: Supervision Ambulation Distance (Feet): 250 Feet Assistive device: None Gait Pattern/deviations: Step-through pattern;Wide base of support Gait velocity: moderate speed Gait velocity interpretation: Below normal speed for age/gender General Gait Details: generally steady, but antalgic appearing due to pronated and collapsed feet.  Stairs Stairs: Yes Stairs assistance: Min guard Stair Management: One rail Right;Step to pattern;Forwards (up and down with the "bad" leg due to hip/pelvic weakness R) Number of Stairs: 12 General  stair comments: safe with use of the rail  Wheelchair Mobility    Modified Rankin (Stroke Patients Only)       Balance Overall balance assessment: Needs assistance Sitting-balance support: Feet supported Sitting balance-Leahy Scale: Fair Sitting balance - Comments: challenge like pulling opposite leg over knee to donn shoes makes pt slowly list posteriorly.     Standing balance-Leahy Scale: Fair                               Pertinent Vitals/Pain Pain Assessment: 0-10 Pain Score: 8  Pain Location: incisional pain Pain Descriptors / Indicators: Sharp Pain Intervention(s): Premedicated before session    Home Living Family/patient expects to be discharged to:: Private residence Living Arrangements: Spouse/significant other Available Help at Discharge: Family Type of Home: House Home Access: Stairs to enter Entrance Stairs-Rails: Psychiatric nurse of Steps: multiple Home Layout: Multi-level;Bed/bath upstairs Home Equipment: Environmental consultant - 2 wheels;Toilet riser;Shower seat;Adaptive equipment      Prior Function Level of Independence: Independent               Hand Dominance        Extremity/Trunk Assessment   Upper Extremity Assessment: Defer to OT evaluation           Lower Extremity Assessment: Overall WFL for tasks assessed (some trunk/pelvic weakness L> R)         Communication   Communication: No difficulties  Cognition Arousal/Alertness: Awake/alert Behavior During Therapy: WFL for tasks assessed/performed Overall Cognitive Status: Within Functional Limits for tasks assessed  General Comments General comments (skin integrity, edema, etc.): Instructed in cervical care/precautions, safe transition side to sit, lifting restrictions, progression of activity.    Exercises        Assessment/Plan    PT Assessment Patient needs continued PT services  PT Diagnosis Abnormality of gait;Acute  pain   PT Problem List Decreased strength;Decreased activity tolerance;Decreased mobility;Decreased knowledge of precautions;Pain  PT Treatment Interventions Gait training;Functional mobility training;Therapeutic activities;Patient/family education   PT Goals (Current goals can be found in the Care Plan section) Acute Rehab PT Goals Patient Stated Goal: Independent PT Goal Formulation: With patient Time For Goal Achievement: 02/19/14 Potential to Achieve Goals: Good    Frequency Other (Comment) (Potentially 1 more treatment before she is d/c'd to reinforc)   Barriers to discharge        Co-evaluation               End of Session   Activity Tolerance: Patient tolerated treatment well Patient left: in bed;with call bell/phone within reach Nurse Communication: Mobility status    Functional Assessment Tool Used: clinical judgement, Functional Limitation: Mobility: Walking and moving around Mobility: Walking and Moving Around Current Status (E0923): At least 1 percent but less than 20 percent impaired, limited or restricted Mobility: Walking and Moving Around Goal Status [phone]): At least 1 percent but less than 20 percent impaired, limited or restricted    Time: 2263-3354 PT Time Calculation (min) (ACUTE ONLY): 33 min   Charges:   PT Evaluation $Initial PT Evaluation Tier I: 1 Procedure PT Treatments $Gait Training: 8-22 mins $Therapeutic Activity: 8-22 mins   PT G Codes:   Functional Assessment Tool Used: clinical judgement, Functional Limitation: Mobility: Walking and moving around    Monserrat Vidaurri, Tessie Fass 02/17/2014, 4:40 PM  02/17/2014  Donnella Sham, [place] [phone]  (pager)

## 2014-02-17 NOTE — Discharge Instructions (Signed)
Anterior and Posterior Decompressions with Fusions Decompression is a procedure performed to relieve symptoms caused by pressure or compression on the spinal cord and nerve roots. The spinal cord is a cord of nervous tissue. It extends from the brain and passes through the spinal canal (passage formed by the openings in the backbone). Nerves branch out from the spinal cord to different parts of the body. Vertebrae (backbones) are a group of individual bones. They form the spinal column. Each vertebra is made up of lamina (bony arches of the spinal canal), spine, and foramen (opening in the backbone). A disc is a soft, gel-like cushion between each vertebra.  Decompression can be carried out as an anterior or posterior procedure. Anterior decompression is where the surgeon makes an incision more toward the front of your body in order to get access to the area needing repair. Posterior decompression is where the surgeon makes an incision more toward the back of your body in order to get access to the area needing repair. Depending upon the details of your specific condition, there are reasons why the surgeon may choose one approach over the other. CAUSES Compression of spinal cord and nerves can be caused by:  Bulging or collapse of the spinal disc.  Loosening of the ligaments.  Bony growth. The causes of the spinal cord compression include:  Degenerative diseases (such as with many arthritis problems).  Rupture of the disc.  Traumatic injury.  Spinal stenosis (narrowing of the spinal canal).  Pott disease (tuberculosis of the spine). LET YOUR CAREGIVER KNOW ABOUT:   Bleeding and clotting problems.  Allergy to anesthesia medications.  Allergy to medications like steroid and blood thinners.  Previous surgeries.  Bone diseases like disease of low bone mass (osteoporosis) and infection of bone (osteomyelitis).  Cancerous (neoplastic) conditions. RISKS AND COMPLICATIONS  Bleeding and  collection of blood outside the blood vessels.  Damage to the blood vessels. This can cause heavy bleeding and even death.  Damage to the nerves. This can cause pain, loss of sensation, paralysis, and weakness.  Damage to the covering of the spinal cord (meninges). This can cause the cerebrospinal fluid to leak.  Complications involving graft and plate.  Inadequate fusion.  Wound infection. BEFORE THE PROCEDURE  Your caregiver will make sure that you have been given a reasonable trial of nonsurgical treatment methods. Your caregiver may perform an X-ray study with a dye (diskogram). The injection of a dye into a disc may produce a pain similar to your back pain. This will help your caregiver to identify the disc that is the source of pain.  PROCEDURE Your surgeon will perform spinal decompression and fusion while you are under general anesthesia. There are different methods of performing decompression surgery. They include the following:  Diskectomy. Your surgeon will remove a portion of a spinal disc that is causing pressure on the nerve roots.  Laminotomy or laminectomy. Lamina refers to bony arches of the spinal canal. Laminotomy is a procedure in which your surgeon removes a small portion of lamina. Laminectomy is the removal of an entire lamina.  Foraminotomy or foraminectomy. Foramen refers to the opening in the backbone for nerve roots. Foraminotomy is the removal of a small amount of bone and tissue forming the foramen. Foraminectomy is the removal of a large amount of bone and tissue.  Osteophyte removal. Your surgeon removes bony growths called osteophytes. These cause pressure on the spinal column.  Corpectomy. Corpus refers to the body of vertebra. This procedure involves  the removal of the body of a vertebra and also the discs. °· Spinal fusion is required if there is a curvature of the spine or forward slip of a vertebra. Spinal fusion is a procedure of fusing two or more  vertebrae together with a bone graft (new bone or substitute material from your body). This is further strengthened by using screws, plate, and cage apparatus. °¨ Fusion prevents motion between 2 vertebrae. It also prevents the curvature of the spine and slip of vertebra from getting worse after surgery. °AFTER THE PROCEDURE  °· You will be moved to the recovery room and then to the hospital room. °· You will have a thin tube (catheter) inserted into the urinary bladder to help in urination. °· Your may have pain for the first few days after the surgery. Your caregiver will give you medications to control pain. °· Your caregiver may order blood tests to monitor oxygen carrying protein of the red blood cells (hemoglobin). This would be due to blood loss during the surgery. Hemoglobin should be monitored to make sure that the level of blood oxygen does not become too low. °· You will be given a back brace. A back brace limits motion and helps fusion of the bone. °· You may continue to have mild pain even after full recovery. °· You may have a series X-ray examinations over time. This will ensure adequate healing and appropriate alignment at the site of operation. °HOME CARE INSTRUCTIONS  °· To get strength and function, start physical therapy, occupational therapy, and exercises. °· To ease the pain, you may have to exercise regularly. That also helps in weight loss. °· To keep your spine in proper alignment, you should sit, stand, walk, turn in bed, and reposition yourself as instructed. °· At first, take only short walks. Slowly increase other activities. °· Avoid smoking. Nicotine inhibits fusion of the bone. °· If narcotics (pain medication) are prescribed, you should not drink alcohol. You should not drive when you are on this medication because you will feel drowsy. °· Avoid use of pain medication products and nonsteroidal anti-inflammatory agents (pain medication). They interfere with the development and growth  of new bone cells. °· Your caregiver may recommend using ice to manage pain. Ask your caregiver for instructions on how to do it. °· Avoid lifting heavy objects. °· Avoid bending and twisting motions. °SEEK MEDICAL CARE IF:  °· You have increased pain. °· There is expanding redness at the operative site. °· You have a fever. °SEEK IMMEDIATE MEDICAL CARE IF:  °· You have any discomfort with the substitute material that has been used during the spinal fusion procedure. °· You feel a collection of blood outside the blood vessels. °· You notice any changes in the smell, appearance, or amount of drainage. °Document Released: 03/19/2007 Document Revised: 07/14/2013 Document Reviewed: 07/20/2007 °[employer]® Patient Information ©2015 [employer]. This information is not intended to replace advice given to you by your health care provider. Make sure you discuss any questions you have with your health care provider. ° °

## 2014-02-17 NOTE — Plan of Care (Signed)
Problem: Consults Goal: Diagnosis - Spinal Surgery Outcome: Completed/Met Date Met:  02/17/14 Cervical Spine Fusion     

## 2014-02-17 NOTE — Anesthesia Procedure Notes (Signed)
Procedure Name: Intubation Date/Time: 02/17/2014 9:25 AM Performed by: Maeola Harman Pre-anesthesia Checklist: Patient identified, Emergency Drugs available, Suction available, Patient being monitored and Timeout performed Patient Re-evaluated:Patient Re-evaluated prior to inductionOxygen Delivery Method: Circle system utilized Preoxygenation: Pre-oxygenation with 100% oxygen Intubation Type: IV induction Ventilation: Mask ventilation without difficulty Laryngoscope Size: Mac and 3 Grade View: Grade I Tube type: Oral Tube size: 7.5 mm Number of attempts: 1 Airway Equipment and Method: Stylet Placement Confirmation: ETT inserted through vocal cords under direct vision,  positive ETCO2 and breath sounds checked- equal and bilateral Secured at: 21 cm Tube secured with: Tape Dental Injury: Teeth and Oropharynx as per pre-operative assessment  Comments: Easy atraumatic induction and intubation with MAC 3 blade.  Dr. Oletta Lamas verified placement.  [name] Session, CRNA

## 2014-02-17 NOTE — Anesthesia Postprocedure Evaluation (Signed)
  Anesthesia Post-op Note  [name]  Procedure(s) Performed: Procedure(s) with comments: CERVICAL FOUR TO FIVE ANTERIOR CERVICAL DECOMPRESSION/DISCECTOMY FUSION 1 LEVEL (N/A) - C4-5 Anterior cervical decompression/diskectomy/fusion  Patient Location: PACU  Anesthesia Type:General  Level of Consciousness: awake  Airway and Oxygen Therapy: Patient Spontanous Breathing  Post-op Pain: mild  Post-op Assessment: Post-op Vital signs reviewed  Post-op Vital Signs: Reviewed  Last Vitals:  Filed Vitals:   02/17/14 0640  BP: 194/82  Pulse: 62  Temp: 36.7 C  Resp: 18    Complications: No apparent anesthesia complications

## 2014-02-17 NOTE — H&P (Signed)
[name] is an 79 y.o. female.   Chief Complaint: neck pain and arm and leg weakness HPI: Patient is a 79 year old individual who has had rheumatoid arthritis involving any number of joints including her spine extensively. Over the years she had undergone several lumbar fusions most recently involving L1-2 in may of this year. Her back has improved but she notes lack of stamina in her legs and arms.     A myelogram from may of this year showed severe spondylosis at multiple levels but because of severe stenosis in her lumbar spine much dye did not get to the cervical spine.  A subsequent mri shows severe stenosis at C4-5 with spondylolisthesis there.      Her motor function in the upper extremities shows diffuse weakness  Consistent with myelopathic involvement but she also has significant involvement of rheumatoid arthritis in her upper extremities making accurate diagnosis of her strength deficits difficult. Non the less she is now admitted to undergo decompression and fusion at C4-5.  Past Medical History  Diagnosis Date  . Ulcerative colitis   . Hypothyroidism   . Hypertension     pcp   dr reed   [facility]  . Pneumonia 2008  . Chronic bronchitis 10/31/2011    "prone to it; I have sinus/bronchitis problem probably q yr"  . Multiple skin tears 10/31/2011    "get them very easily; my skin is thin and I bruise easily"  . Chronic back pain     "my entire spine"  . Edema   . Other and unspecified hyperlipidemia   . Encounter for long-term (current) use of other medications   . Senile osteoporosis   . Spinal stenosis, unspecified region other than cervical   . Unspecified glaucoma   . Hypothyroidism   . Complication of anesthesia 2009    htn with block- for hand surgery   . Anxiety     sometimes   . Rheumatoid arthritis(714.0)     degenerative disc disease     Past Surgical History  Procedure Laterality Date  . Back surgery    . Sinus surgery with instatrak    .  Thyroidectomy, partial  1997  . Posterior fusion lumbar spine  10/31/2011    L2-3; 3-4  . Tonsillectomy      "when I was a small child"  . Functional endoscopic sinus surgery  1988  . Posterior fusion lumbar spine  11/2009    L3-4;  L4-5  . Joint replacement  2009; 2006    joints 2 fingers,rt hand; joint left thumb  . Spine surgery    . Posterior lumbar fusion N/A 08/05/13    T1, T2  . Eye surgery      Family History  Problem Relation Age of Onset  . Heart disease Mother   . Cancer Father     lung   Social History:  reports that she has been passively smoking Cigarettes.  She has a 12.5 pack-year smoking history. She has never used smokeless tobacco. She reports that she drinks about 2.4 oz of alcohol per week. She reports that she does not use illicit drugs.  Allergies: No Known Allergies  Medications Prior to Admission  Medication Sig Dispense Refill  . adalimumab (HUMIRA) 40 MG/0.8ML injection Inject 0.8 mLs (40 mg total) into the skin every 14 (fourteen) days. Resume ONLY if off all antibiotics 2 each 0  . aspirin EC 81 MG tablet Take 81 mg by mouth every other  day.    . balsalazide (COLAZAL) 750 MG capsule Take 1,500 mg by mouth 3 (three) times daily.     . Calcium Carb-Cholecalciferol (CALCIUM + D3) 600-200 MG-UNIT TABS Take 1 tablet by mouth daily.    . CELEBREX 200 MG capsule Take 200 mg by mouth 2 (two) times daily.     . Cholecalciferol (VITAMIN D3) 1000 UNITS CAPS Take 1,000 Units by mouth daily.    Marland Kitchen estradiol (ESTRACE) 0.5 MG tablet Take 0.5 mg by mouth daily.     . folic acid (FOLVITE) 1 MG tablet Take 2 mg by mouth daily.    Marland Kitchen guaiFENesin (MUCINEX) 600 MG 12 hr tablet Take 1,200 mg by mouth daily as needed (sinus relief).     . hydrochlorothiazide (HYDRODIURIL) 25 MG tablet Take 25 mg by mouth daily with breakfast.    . HYDROcodone-acetaminophen (NORCO/VICODIN) 5-325 MG per tablet Take 1 tablet by mouth every 6 (six) hours as needed (pain).   0  . latanoprost  (XALATAN) 0.005 % ophthalmic solution Place 1 drop into both eyes at bedtime.    Marland Kitchen leflunomide (ARAVA) 10 MG tablet Take 20 mg by mouth daily.     Marland Kitchen levothyroxine (SYNTHROID) 88 MCG tablet 1 BY MOUTH DAILY BEFORE BREAKFAST (Patient taking differently: Take 88 mcg by mouth daily before breakfast. ) 90 tablet 1  . medroxyPROGESTERone (PROVERA) 2.5 MG tablet Take 2.5 mg by mouth at bedtime.     . Multiple Vitamin (MULTIVITAMIN WITH MINERALS) TABS Take 1 tablet by mouth daily.    . Omega-3 Fatty Acids (FISH OIL) 1200 MG CAPS Take 1,200 mg by mouth daily.    . predniSONE (DELTASONE) 5 MG tablet Take 5 mg by mouth daily with breakfast.     . SYNTHROID 88 MCG tablet TAKE 1 TABLET BY MOUTH DAILY BEFORE BREAKFAST (Patient not taking: Reported on 02/09/2014) 90 tablet 1    No results found for this or any previous visit (from the past 48 hour(s)). No results found.  Review of Systems  Eyes: Negative.   Respiratory: Negative.   Cardiovascular: Negative.   Gastrointestinal: Negative.   Genitourinary: Negative.   Musculoskeletal: Positive for neck pain.  Skin: Negative.   Neurological: Positive for weakness.  Endo/Heme/Allergies: Negative.   Psychiatric/Behavioral: Negative.     There were no vitals taken for this visit. Physical Exam  Constitutional: She is oriented to person, place, and time. She appears well-developed and well-nourished.  HENT:  Head: Normocephalic and atraumatic.  Eyes: Conjunctivae are normal. Pupils are equal, round, and reactive to light.  Neck:  Very limited range of motion of neck in turning  Cardiovascular: Normal rate and regular rhythm.   Respiratory: Effort normal and breath sounds normal.  GI: Soft. Bowel sounds are normal.  Musculoskeletal:  Extremity signs of rheumatoid arthritis  Neurological: She is alert and oriented to person, place, and time.  Limited strength in upper extremities secondary to spondylosis with cord involvement or sequela of rheumatoid  joints  Skin: Skin is warm and dry.  Psychiatric: She has a normal mood and affect. Her behavior is normal. Judgment and thought content normal.     Assessment: Spondylosis and Spondylolisthesis at C4-5 with cord compression. ACDF at C4-5  Hosam Mcfetridge J 02/17/2014, 6:29 AM

## 2014-02-17 NOTE — Op Note (Signed)
Date of surgery: 02/17/2014  Preoperative diagnosis: Cervical spondylosis with radiculopathy and myelopathy C4-5 postoperative diagnosis: Cervical spondylosis with radiculopathy and myelopathy C4-5  Procedure: Anterior cervical discectomy decompression of nerve roots and spinal canal C4-5, arthrodesis  with structural allograft, Nuvasive plate fixation D3-2 Surgeon: [name] M.D. Asst.:Bueford Arp Trenton Gammon Indications: Patient is a 79 year old individual who's had advanced spondylosis and myelopathy in the cervical spine for some time. She has rheumatoid arthritis. She's been disabled with pain and also weakness in the upper and lower extremities. She's been advised regarding decompression of C4-C5.  Procedure: The patient was brought to the operating room placed on the table in supine position. After the smooth induction of general endotracheal anesthesia neck was placed in 5 pounds of halter traction and prepped with alcohol and DuraPrep. After sterile draping and appropriate timeout procedure a transverse incision was created in the left side of the neck and carried down to the platysma. The plane between the sternocleidomastoid and strap muscles dissected bluntly until the prevertebral space was reached. The first identifiable disc space was noted to be C4-5, on a localizing radiograph. The dissection was then undertaken in the longus coli muscle to allow placement of a self-retaining Caspar type retractor.  The anterior longitudinal ligament was opened atand ventral osteophytes were removed with a Leksell rongeur and Kerrison punch. Interspace was cleared of significant quantity of the degenerated disc material in the region of the posterior longitudinal ligament was removed. Dissection was carried out using a high-speed drill and 3-0 Karlin curettes. Uncinate processes were drilled down and removed and osteophytes from the inferior margin of the body of C4 were removed with a Kerrison 2 mm gold punch.  After the central canal and lateral recesses were well decompressed hemostasis was achieved with the bipolar cautery and some small pledgets of Gelfoam soaked in thrombin that were later irrigated away.  An 8 mm cortical ring allograft was then placed into the interspace. A 22 mm nuvasive plate was then fitted to the ventral aspect of the vertebral bodies and secured with 4 variable angle 15 mm screws. A final localizing radiograph identified good position of the hardware and partial reduction of the spondylolisthesis. With this the wound was irrigated copiously hemostasis was obtained meticulously and then the platysma was closed with 3-0 Vicryl, 3-0 Vicryl was used subcuticularly to close the skin. Dermabond was placed on the skin. Patient tolerated procedure well, blood loss was estimated at less than 20 mL.

## 2014-02-17 NOTE — Anesthesia Preprocedure Evaluation (Addendum)
Anesthesia Evaluation  Patient identified by MRN, date of birth, ID band Patient awake    Reviewed: Allergy & Precautions, H&P , NPO status , Patient's Chart, lab work & pertinent test results, Unable to perform ROS - Chart review only  History of Anesthesia Complications (+) PROLONGED EMERGENCE and history of anesthetic complications  Airway Mallampati: II  TM Distance: >3 FB     Dental  (+) Teeth Intact, Dental Advisory Given   Pulmonary pneumonia -, resolved,  breath sounds clear to auscultation        Cardiovascular hypertension, Pt. on medications Rhythm:Regular Rate:Normal     Neuro/Psych Anxiety    GI/Hepatic Neg liver ROS, PUD,   Endo/Other  Hypothyroidism   Renal/GU negative Renal ROS     Musculoskeletal  (+) Arthritis -, Rheumatoid disorders,    Abdominal (+)  Abdomen: soft. Bowel sounds: normal.  Peds  Hematology negative hematology ROS (+)   Anesthesia Other Findings   Reproductive/Obstetrics                         Anesthesia Physical Anesthesia Plan  ASA: III  Anesthesia Plan: General   Post-op Pain Management:    Induction: Intravenous  Airway Management Planned: Oral ETT  Additional Equipment:   Intra-op Plan:   Post-operative Plan: Extubation in OR  Informed Consent: I have reviewed the patients History and Physical, chart, labs and discussed the procedure including the risks, benefits and alternatives for the proposed anesthesia with the patient or authorized representative who has indicated his/her understanding and acceptance.     Plan Discussed with: CRNA and Anesthesiologist  Anesthesia Plan Comments:         Anesthesia Quick Evaluation

## 2014-02-17 NOTE — Plan of Care (Signed)
Problem: Consults Goal: Spinal Surgery Patient Education See Patient Education Module for education specifics. Outcome: Completed/Met Date Met:  02/17/14     

## 2014-02-17 NOTE — Discharge Summary (Signed)
Physician Discharge Summary  Patient ID: [name] MRN: [id number] DOB/AGE: 77-Apr-1936 77 y.o.  Admit date: 02/17/2014 Discharge date: 02/17/2014  Admission Diagnoses: Cervical spondylosis with myelopathy C4-C5  Discharge Diagnoses: Cervical spondylosis with myelopathy C4-C5 Active Problems:   Cervical spondylosis with myelopathy   Discharged Condition: good  Hospital Course: Patient tolerated surgery well.  Consults: None  Significant Diagnostic Studies: None  Treatments: surgery: Anterior cervical decompression arthrodesis C4-C5  Discharge Exam: Blood pressure 143/78, pulse 63, temperature 98.6 F (37 C), temperature source Oral, resp. rate 18, weight 62.596 kg (138 lb), SpO2 98 %. Incision/Wound: Incision is clean and dry. Motor function is intact in the upper extremities and lower extremities as it was preoperatively. Patient has significant findings of rheumatoid arthritis in the upper extremities.  Disposition: 01-Home or Self Care  Discharge Instructions    Call MD for:  redness, tenderness, or signs of infection (pain, swelling, redness, odor or green/yellow discharge around incision site)    Complete by:  As directed      Call MD for:  severe uncontrolled pain    Complete by:  As directed      Call MD for:  temperature >100.4    Complete by:  As directed      Diet - low sodium heart healthy    Complete by:  As directed      Discharge instructions    Complete by:  As directed   Okay to shower. Do not apply salves or appointments to incision. No heavy lifting with the upper extremities greater than 15 pounds. May resume driving when not requiring pain medication and patient feels comfortable with doing so.     Increase activity slowly    Complete by:  As directed             Medication List    TAKE these medications        adalimumab 40 MG/0.8ML injection  Commonly known as:  HUMIRA  Inject 0.8 mLs (40 mg total) into the skin every 14 (fourteen)  days. Resume ONLY if off all antibiotics     aspirin EC 81 MG tablet  Take 81 mg by mouth every other day.     balsalazide 750 MG capsule  Commonly known as:  COLAZAL  Take 1,500 mg by mouth 3 (three) times daily.     Calcium + D3 600-200 MG-UNIT Tabs  Take 1 tablet by mouth daily.     CELEBREX 200 MG capsule  Generic drug:  celecoxib  Take 200 mg by mouth 2 (two) times daily.     diazepam 5 MG tablet  Commonly known as:  VALIUM  Take 1 tablet (5 mg total) by mouth every 6 (six) hours as needed for muscle spasms.     estradiol 0.5 MG tablet  Commonly known as:  ESTRACE  Take 0.5 mg by mouth daily.     Fish Oil 1200 MG Caps  Take 1,200 mg by mouth daily.     folic acid 1 MG tablet  Commonly known as:  FOLVITE  Take 2 mg by mouth daily.     guaiFENesin 600 MG 12 hr tablet  Commonly known as:  MUCINEX  Take 1,200 mg by mouth daily as needed (sinus relief).     hydrochlorothiazide 25 MG tablet  Commonly known as:  HYDRODIURIL  Take 25 mg by mouth daily with breakfast.     HYDROcodone-acetaminophen 5-325 MG per tablet  Commonly known as:  NORCO/VICODIN  Take 1 tablet  by mouth every 6 (six) hours as needed (pain).     latanoprost 0.005 % ophthalmic solution  Commonly known as:  XALATAN  Place 1 drop into both eyes at bedtime.     leflunomide 10 MG tablet  Commonly known as:  ARAVA  Take 20 mg by mouth daily.     medroxyPROGESTERone 2.5 MG tablet  Commonly known as:  PROVERA  Take 2.5 mg by mouth at bedtime.     multivitamin with minerals Tabs tablet  Take 1 tablet by mouth daily.     oxyCODONE-acetaminophen 5-325 MG per tablet  Commonly known as:  PERCOCET/ROXICET  Take 1-2 tablets by mouth every 4 (four) hours as needed for moderate pain.     predniSONE 5 MG tablet  Commonly known as:  DELTASONE  Take 5 mg by mouth daily with breakfast.     SYNTHROID 88 MCG tablet  Generic drug:  levothyroxine  TAKE 1 TABLET BY MOUTH DAILY BEFORE BREAKFAST      levothyroxine 88 MCG tablet  Commonly known as:  SYNTHROID  1 BY MOUTH DAILY BEFORE BREAKFAST     Vitamin D3 1000 UNITS Caps  Take 1,000 Units by mouth daily.         SignedEarleen Newport 02/17/2014, 8:18 PM

## 2014-02-18 ENCOUNTER — Encounter (HOSPITAL_COMMUNITY): Payer: Self-pay | Admitting: Neurological Surgery

## 2014-02-18 DIAGNOSIS — M4712 Other spondylosis with myelopathy, cervical region: Secondary | ICD-10-CM | POA: Diagnosis not present

## 2014-02-18 NOTE — Progress Notes (Signed)
Physical Therapy Treatment Patient Details Name: [name] MRN: [id number] DOB: Mar 21, 1934 Today's Date: 02/18/2014    History of Present Illness pt admitted with cervical spondylosis of C45 with radiculopathy, s/p ACDF    PT Comments    Progressed with mobility and confidence with precautions.  She has plans to return to water exercise at the [facility] and discussed ways to progress activity as tolerated.  She has severely unstable left knee and considering joint replacement, but wants better education from orthopedist.  Will utilize soft brace and "go slow" to ensure she doesn't fall.  Follow Up Recommendations  No PT follow up     Equipment Recommendations  None recommended by PT    Recommendations for Other Services       Precautions / Restrictions Precautions Precautions: Cervical    Mobility  Bed Mobility Overal bed mobility: Modified Independent   Rolling: Modified independent (Device/Increase time) Sidelying to sit: Modified independent (Device/Increase time)     Sit to sidelying: Modified independent (Device/Increase time) General bed mobility comments: able to demonstrate proper technique from flat bed; using rails cause they are there; did remind not to push/pull too much  Transfers   Equipment used: None Transfers: Sit to/from Stand Sit to Stand: Supervision         General transfer comment: increased time and heavy UE assist; discussed height of seating surface and to place pillows as needed for ease of tranfers; discussed car transfers as well  Ambulation/Gait Ambulation/Gait assistance: Independent Ambulation Distance (Feet): 150 Feet Assistive device: None Gait Pattern/deviations: Step-through pattern;Wide base of support     General Gait Details: genu valgus deformity at knees R>L and pronated feet with collapsed arches   Stairs         General stair comments: felt confident wtih practice yesterday and declined today  Wheelchair  Mobility    Modified Rankin (Stroke Patients Only)       Balance   Sitting-balance support: No upper extremity supported         Standing balance-Leahy Scale: Good                      Cognition                            Exercises Total Joint Exercises Quad Sets: AROM;Both;5 reps;Supine Towel Squeeze: AROM;Both;5 reps;Supine    General Comments General comments (skin integrity, edema, etc.): discussion re: knee bracing options due to risk for medial collapse and falls; reports uses elastic brace and feels heavily instrumented braces might be difficult to don and heavy and hard to fit due to skinny calves and large nodular knees; reviewed bed level knee strengthening HEP she can do without pain; discussed her typical water exercise and practicing in more shallow area to see if tolerates for improved strengthening.   Review of all precautions as well.      Pertinent Vitals/Pain Pain Score: 6  Pain Location: states with mobility left knee more painful than neck Pain Intervention(s): Monitored during session    Home Living                      Prior Function            PT Goals (current goals can now be found in the care plan section) Progress towards PT goals: Goals met/education completed, patient discharged from PT    Frequency  Other (Comment) (d/c  PT)    PT Plan Current plan remains appropriate    Co-evaluation             End of Session   Activity Tolerance: Patient tolerated treatment well Patient left: in chair;with call bell/phone within reach     Time: 0855-0920 PT Time Calculation (min) (ACUTE ONLY): 25 min  Charges:  $Gait Training: 8-22 mins $Self Care/Home Management: 8-22                    G Codes:  Functional Assessment Tool Used: clinical judgement, Functional Limitation: Mobility: Walking and moving around Mobility: Walking and Moving Around Goal Status [phone]): At least 1 percent but less than 20  percent impaired, limited or restricted Mobility: Walking and Moving Around Discharge Status [phone]): At least 1 percent but less than 20 percent impaired, limited or restricted   [facility] 02/18/2014, 10:20 AM Magda Kiel, PT [phone] 02/18/2014

## 2014-02-18 NOTE — Progress Notes (Signed)
Patient alert and oriented, mae's well, voiding adequate amount of urine, swallowing without difficulty, no c/o pain. Patient discharged home with family. Script and discharged instructions given to patient. Patient and family stated understanding of d/c instructions given and has an appointment with MD. Aisha Roselin Wiemann RN 

## 2014-02-18 NOTE — Plan of Care (Signed)
Problem: Phase I Progression Outcomes Goal: Pain controlled with appropriate interventions Outcome: Progressing Goal: OOB as tolerated unless otherwise ordered Outcome: Progressing Goal: Log roll for position change Outcome: Progressing Goal: Initial discharge plan identified Outcome: Progressing Goal: PT/OT consults requested Outcome: Progressing Goal: Hemodynamically stable Outcome: Progressing

## 2014-02-18 NOTE — Progress Notes (Signed)
OT Cancellation Note  Patient Details Name: [name] MRN: [id number] DOB: Sep 08, 1934   Cancelled Treatment:    Reason Eval/Treat Not Completed: OT screened.Pt states she has no OT concerns and did not want to review. She has had previous back surgeries. Cervical handout given to her   [name] [phone] 02/18/2014, 9:36 AM

## 2014-02-18 NOTE — Plan of Care (Signed)
Problem: Acute Rehab PT Goals(only PT should resolve) Goal: Pt Will Go Supine/Side To Sit Outcome: Completed/Met Date Met:  02/18/14 Goal: Pt Will Ambulate Outcome: Completed/Met Date Met:  02/18/14 Goal: Pt Will Verbalize and Adhere to Precautions While PT Will Verbalize and Adhere to Precautions While Performing Mobility  Outcome: Completed/Met Date Met:  02/18/14

## 2014-03-10 ENCOUNTER — Ambulatory Visit (INDEPENDENT_AMBULATORY_CARE_PROVIDER_SITE_OTHER): Payer: Medicare Other | Admitting: Internal Medicine

## 2014-03-10 ENCOUNTER — Encounter: Payer: Self-pay | Admitting: Internal Medicine

## 2014-03-10 VITALS — BP 136/80 | HR 68 | Temp 98.1°F | Resp 10 | Ht 66.5 in | Wt 143.0 lb

## 2014-03-10 DIAGNOSIS — I10 Essential (primary) hypertension: Secondary | ICD-10-CM

## 2014-03-10 DIAGNOSIS — R600 Localized edema: Secondary | ICD-10-CM

## 2014-03-10 MED ORDER — FUROSEMIDE 20 MG PO TABS: ORAL_TABLET | ORAL | Status: DC

## 2014-03-10 NOTE — Patient Instructions (Signed)
Wear your ted hose as advised

## 2014-03-10 NOTE — Progress Notes (Signed)
Patient ID: [name], female   DOB: 04-24-1934, 79 y.o.   MRN: [id number]    Chief Complaint  Patient presents with  . Acute Visit    Fluid build up in left leg off/on x long time. Patient had surgery on 02/17/14 on neck, post surgery patient with increased swelling    No Known Allergies  HPI 79 y/o female patient here with acute concern. She has noticed increased swelling in both her legs, lef more post neck surgery on 02/17/14. She has had swelling in the past. On review of her chart, swelling always more on left side, her ABI has been normal in past. dvt has been ruled out. There has been concerns for PVD.  She complaints of discomfort with the swelling, denies pain in particular. Denies drainage from the leg. Denies new redness in her leg. Denies fever or chills  ROS No fever or chills No dyspnea, cough No chest pain or palpitations No dizziness or lightheadedness No new focal weakness No witnessed trauma  Past Medical History  Diagnosis Date  . Ulcerative colitis   . Hypothyroidism   . Hypertension     pcp   dr reed   [facility]  . Pneumonia 2008  . Chronic bronchitis 10/31/2011    "prone to it; I have sinus/bronchitis problem probably q yr"  . Multiple skin tears 10/31/2011    "get them very easily; my skin is thin and I bruise easily"  . Chronic back pain     "my entire spine"  . Edema   . Other and unspecified hyperlipidemia   . Encounter for long-term (current) use of other medications   . Senile osteoporosis   . Spinal stenosis, unspecified region other than cervical   . Unspecified glaucoma   . Hypothyroidism   . Complication of anesthesia 2009    htn with block- for hand surgery   . Anxiety     sometimes   . Rheumatoid arthritis(714.0)     degenerative disc disease    Medication reviewed. See [facility]   Physical exam  Wt Readings from Last 3 Encounters:  03/10/14 143 lb (64.864 kg)  02/17/14 138 lb (62.596 kg)  02/10/14 138 lb 9.6 oz (62.869 kg)    BP 136/80 mmHg  Pulse 68  Temp(Src) 98.1 F (36.7 C) (Oral)  Resp 10  Ht 5' 6.5" (1.689 m)  Wt 143 lb (64.864 kg)  BMI 22.74 kg/m2  SpO2 95%  Constitutional: thin elderly female in no distress  HENT:   Head: Normocephalic and atraumatic.  Cardiovascular: Normal rate, regular rhythm, normal heart sounds and intact distal pulses.   Pulmonary/Chest: Effort normal and breath sounds normal. No respiratory distress. She has no rales.  Abdominal: Soft. Bowel sounds are normal. There is no tenderness.  Musculoskeletal: has limp, no assistive device, 1+ leg edema in right leg and 2+ edema in left leg, no erythema, normal temperature, no skin breakdown Neurological: She is alert and oriented to person, place, and time.  Skin: Skin is warm and dry. chronic venous stasis skin changes to anterior shins  Psychiatric: She has a normal mood and affect.   Labs  Lab Results  Component Value Date   CREATININE 0.65 02/10/2014   Lab Results  Component Value Date   TSH 0.596 08/27/2013   Assessment/plan  1. Edema of left lower extremity With her weight gain and increased edema, concern for PVD with worsening from stasis post surgery. Advised to keep legs elevated at rest. Start  lasix 20 mg bid for 5 days, then once a day for 5 days and then switch to once a day only when needed. Advised to wear compression stocking.   2. HTN Stable reading, on hctz continue current regimen - Basic Metabolic Panel; Future

## 2014-03-23 ENCOUNTER — Other Ambulatory Visit: Payer: Medicare Other

## 2014-03-24 LAB — COMPREHENSIVE METABOLIC PANEL
ALT: 9 IU/L (ref 0–32)
AST: 26 IU/L (ref 0–40)
Albumin/Globulin Ratio: 2.8 — ABNORMAL HIGH (ref 1.1–2.5)
Albumin: 4.2 g/dL (ref 3.5–4.8)
Alkaline Phosphatase: 108 IU/L (ref 39–117)
BUN/Creatinine Ratio: 25 (ref 11–26)
BUN: 22 mg/dL (ref 8–27)
CO2: 26 mmol/L (ref 18–29)
Calcium: 9.4 mg/dL (ref 8.7–10.3)
Chloride: 100 mmol/L (ref 97–108)
Creatinine, Ser: 0.88 mg/dL (ref 0.57–1.00)
GFR calc Af Amer: 73 mL/min/{1.73_m2} (ref >59)
GFR calc non Af Amer: 64 mL/min/{1.73_m2} (ref >59)
Globulin, Total: 1.5 g/dL (ref 1.5–4.5)
Glucose: 74 mg/dL (ref 65–99)
Potassium: 3.7 mmol/L (ref 3.5–5.2)
Sodium: 141 mmol/L (ref 134–144)
Total Bilirubin: 0.5 mg/dL (ref 0.0–1.2)
Total Protein: 5.7 g/dL — ABNORMAL LOW (ref 6.0–8.5)

## 2014-03-24 LAB — CBC WITH DIFFERENTIAL
Basophils Absolute: 0.1 10*3/uL (ref 0.0–0.2)
Basos: 1 %
Eos: 5 %
Eosinophils Absolute: 0.3 10*3/uL (ref 0.0–0.4)
HCT: 39.8 % (ref 34.0–46.6)
Hemoglobin: 13.4 g/dL (ref 11.1–15.9)
Immature Grans (Abs): 0 10*3/uL (ref 0.0–0.1)
Immature Granulocytes: 0 %
Lymphocytes Absolute: 2.2 10*3/uL (ref 0.7–3.1)
Lymphs: 36 %
MCH: 30.2 pg (ref 26.6–33.0)
MCHC: 33.7 g/dL (ref 31.5–35.7)
MCV: 90 fL (ref 79–97)
Monocytes Absolute: 0.7 10*3/uL (ref 0.1–0.9)
Monocytes: 12 %
Neutrophils Absolute: 2.7 10*3/uL (ref 1.4–7.0)
Neutrophils Relative %: 46 %
Platelets: 225 10*3/uL (ref 150–379)
RBC: 4.43 x10E6/uL (ref 3.77–5.28)
RDW: 14.6 % (ref 12.3–15.4)
WBC: 6 10*3/uL (ref 3.4–10.8)

## 2014-03-24 LAB — LIPID PANEL
Chol/HDL Ratio: 2.8 ratio units (ref 0.0–4.4)
Cholesterol, Total: 239 mg/dL — ABNORMAL HIGH (ref 100–199)
HDL: 86 mg/dL (ref >39)
LDL Calculated: 130 mg/dL — ABNORMAL HIGH (ref 0–99)
Triglycerides: 115 mg/dL (ref 0–149)
VLDL Cholesterol Cal: 23 mg/dL (ref 5–40)

## 2014-03-24 LAB — TSH: TSH: 2.18 u[IU]/mL (ref 0.450–4.500)

## 2014-03-26 ENCOUNTER — Encounter: Payer: Self-pay | Admitting: Internal Medicine

## 2014-03-26 ENCOUNTER — Ambulatory Visit (INDEPENDENT_AMBULATORY_CARE_PROVIDER_SITE_OTHER): Payer: 59 | Admitting: Internal Medicine

## 2014-03-26 VITALS — BP 134/88 | HR 100 | Temp 98.1°F | Resp 10 | Ht 67.0 in | Wt 136.8 lb

## 2014-03-26 DIAGNOSIS — M069 Rheumatoid arthritis, unspecified: Secondary | ICD-10-CM

## 2014-03-26 DIAGNOSIS — K5190 Ulcerative colitis, unspecified, without complications: Secondary | ICD-10-CM

## 2014-03-26 DIAGNOSIS — R600 Localized edema: Secondary | ICD-10-CM

## 2014-03-26 DIAGNOSIS — I872 Venous insufficiency (chronic) (peripheral): Secondary | ICD-10-CM

## 2014-03-26 DIAGNOSIS — Z23 Encounter for immunization: Secondary | ICD-10-CM

## 2014-03-26 DIAGNOSIS — M4802 Spinal stenosis, cervical region: Secondary | ICD-10-CM

## 2014-03-26 NOTE — Progress Notes (Signed)
Patient ID: [name], female   DOB: 01-19-1935, 80 y.o.   MRN: [id number]   Location:  [facility]  Code Status: full code; need to discuss advance directive  No Known Allergies  Chief Complaint  Patient presents with  . Medical Management of Chronic Issues    6 month follow-up, fluid retention improved   . Immunizations    Discuss [id number], patient states she got pneumonia after last pneumonia vaccine   . Skin Problem    Dry skin concerns, itchy scalp and discuss options for neck scar     HPI: Patient is a 80 y.o. white female seen in the office today for med mgt of chronic diseases.    Left leg had been swelling.  Saw Dr. Bubba Camp about it after her neck surgery. Edema happens each time after surgery.  EKG In may was normal.  Has no chest pain, palpitations, shortness of breath.  Her mother did pass away from cardiac problems and her sister has some minor heart defect from birth.  BP seems to spike in hospital.  It is normal at home.  I had her on bp meds at one point but her pressure dropped too low.  Can wear compression hose-she puts them on and her husband takes them off.    Left knee is painful these days.  Has to lift the leg to get in and out of the car.  Stairs are very difficult.  Has pain from lateral ankle up through medial knee and thigh.  Has not been able to exercise so feels generally blah.  Dr. Ellene Route has her starting PT, but she wants to get back in the pool first.    Neck surgery was to help with strength in legs and arms and does have less pain in them.    Does not do very much anymore.  Mood is ok.  Had been depressed after her third back surgery, but improved since.    Did not have constipation postop like she did the previous surgery.    Did 2 lasix per day for 5 days, continued on 2 per day after that.    Review of Systems:  Review of Systems  Constitutional: Positive for weight loss and malaise/fatigue.   Down 7 lbs with lasix  Eyes: Negative for blurred vision.  Respiratory: Negative for cough and shortness of breath.   Cardiovascular: Positive for leg swelling. Negative for chest pain and palpitations.  Gastrointestinal: Negative for constipation.  Musculoskeletal: Negative for falls.       Neck pain, back pain better;  Now knee pain on left  Neurological: Positive for weakness.  Endo/Heme/Allergies: Bruises/bleeds easily.  Psychiatric/Behavioral: Negative for depression and memory loss.    Past Medical History  Diagnosis Date  . Ulcerative colitis   . Hypothyroidism   . Hypertension     pcp   dr Nylee Barbuto   [facility]  . Pneumonia 2008  . Chronic bronchitis 10/31/2011    "prone to it; I have sinus/bronchitis problem probably q yr"  . Multiple skin tears 10/31/2011    "get them very easily; my skin is thin and I bruise easily"  . Chronic back pain     "my entire spine"  . Edema   . Other and unspecified hyperlipidemia   . Encounter for long-term (current) use of other medications   . Senile osteoporosis   . Spinal stenosis, unspecified region other than cervical   . Unspecified glaucoma   .  Hypothyroidism   . Complication of anesthesia 2009    htn with block- for hand surgery   . Anxiety     sometimes   . Rheumatoid arthritis(714.0)     degenerative disc disease     Past Surgical History  Procedure Laterality Date  . Back surgery    . Sinus surgery with instatrak    . Thyroidectomy, partial  1997  . Posterior fusion lumbar spine  10/31/2011    L2-3; 3-4  . Tonsillectomy      "when I was a small child"  . Functional endoscopic sinus surgery  1988  . Posterior fusion lumbar spine  11/2009    L3-4;  L4-5  . Joint replacement  2009; 2006    joints 2 fingers,rt hand; joint left thumb  . Spine surgery    . Posterior lumbar fusion N/A 08/05/13    T1, T2  . Eye surgery    . Anterior cervical decomp/discectomy fusion N/A 02/17/2014    Procedure: CERVICAL FOUR TO FIVE  ANTERIOR CERVICAL DECOMPRESSION/DISCECTOMY FUSION 1 LEVEL;  Surgeon: [name], MD;  Location: [facility];  Service: Neurosurgery;  Laterality: N/A;  C4-5 Anterior cervical decompression/diskectomy/fusion  . Cataract extraction Left 11/11/2013  . Cataract extraction Right 12/30/2013    Social History:   reports that she has been passively smoking Cigarettes.  She has a 12.5 pack-year smoking history. She has never used smokeless tobacco. She reports that she drinks about 2.4 oz of alcohol per week. She reports that she does not use illicit drugs.  Family History  Problem Relation Age of Onset  . Heart disease Mother   . Cancer Father     lung    Medications: Patient's Medications  New Prescriptions   No medications on file  Previous Medications   ADALIMUMAB (HUMIRA) 40 MG/0.8ML INJECTION    Inject 0.8 mLs (40 mg total) into the skin every 14 (fourteen) days. Resume ONLY if off all antibiotics   ASPIRIN EC 81 MG TABLET    Take 81 mg by mouth every other day.   BALSALAZIDE (COLAZAL) 750 MG CAPSULE    Take 1,500 mg by mouth 3 (three) times daily.    CALCIUM CARB-CHOLECALCIFEROL (CALCIUM + D3) 600-200 MG-UNIT TABS    Take 1 tablet by mouth daily.   CELEBREX 200 MG CAPSULE    Take 200 mg by mouth 2 (two) times daily.    CHOLECALCIFEROL (VITAMIN D3) 1000 UNITS CAPS    Take 1,000 Units by mouth daily.   DIAZEPAM (VALIUM) 5 MG TABLET    Take 1 tablet (5 mg total) by mouth every 6 (six) hours as needed for muscle spasms.   ESTRADIOL (ESTRACE) 0.5 MG TABLET    Take 0.5 mg by mouth daily.    FOLIC ACID (FOLVITE) 1 MG TABLET    Take 2 mg by mouth daily.   FUROSEMIDE (LASIX) 20 MG TABLET    Twice a day for 5 days and then one tablet daily for 5 days followed by one tablet daily only if needed for leg swelling.   GUAIFENESIN (MUCINEX) 600 MG 12 HR TABLET    Take 1,200 mg by mouth daily as needed (sinus relief).    HYDROCHLOROTHIAZIDE (HYDRODIURIL) 25 MG TABLET    Take 25 mg by mouth daily with  breakfast.   HYDROCODONE-ACETAMINOPHEN (NORCO/VICODIN) 5-325 MG PER TABLET    Take 1 tablet by mouth every 6 (six) hours as needed (pain).    LATANOPROST (XALATAN) 0.005 % OPHTHALMIC SOLUTION  Place 1 drop into both eyes at bedtime.   LEFLUNOMIDE (ARAVA) 10 MG TABLET    Take 20 mg by mouth daily.    LEVOTHYROXINE (SYNTHROID) 88 MCG TABLET    Take 88 mcg by mouth daily before breakfast. DAW, DO NOT SUBSTITUTE WITH GENERIC   MEDROXYPROGESTERONE (PROVERA) 2.5 MG TABLET    Take 2.5 mg by mouth at bedtime.    MULTIPLE VITAMIN (MULTIVITAMIN WITH MINERALS) TABS    Take 1 tablet by mouth daily.   OMEGA-3 FATTY ACIDS (FISH OIL) 1200 MG CAPS    Take 1,200 mg by mouth daily.   OXYCODONE-ACETAMINOPHEN (PERCOCET/ROXICET) 5-325 MG PER TABLET    Take 1-2 tablets by mouth every 4 (four) hours as needed for moderate pain.   PREDNISONE (DELTASONE) 5 MG TABLET    Take 5 mg by mouth daily with breakfast.   Modified Medications   No medications on file  Discontinued Medications   No medications on file     Physical Exam: Filed Vitals:   03/26/14 1007  BP: 134/88  Pulse: 100  Temp: 98.1 F (36.7 C)  TempSrc: Oral  Resp: 10  Height: 5\' 7"  (1.702 m)  Weight: 136 lb 12.8 oz (62.052 kg)  SpO2: 91%  Physical Exam  Constitutional: She is oriented to person, place, and time.  Thin white female  Cardiovascular: Normal rate, regular rhythm, normal heart sounds and intact distal pulses.   Pulmonary/Chest: Effort normal and breath sounds normal. No respiratory distress.  Abdominal: Bowel sounds are normal.  Musculoskeletal: She exhibits edema.  Left foot, ankle and lower leg to shin with 1+ pitting edema;  Bilateral chronic venous stasis changes present;  Edema of left knee; varicosities present   Neurological: She is alert and oriented to person, place, and time. No cranial nerve deficit.  Skin: Skin is warm and dry.  Absorbable sutures remain in neck wound from surgery in December;  Some keloid appearance    Psychiatric: She has a normal mood and affect.    Labs reviewed: Basic Metabolic Panel:  Recent Labs  08/27/13 1506 02/10/14 1131 03/23/14 0854  NA 143 140 141  K 4.9 4.3 3.7  CL 102 102 100  CO2 24 26 26   GLUCOSE 140* 86 74  BUN 26 29* 22  CREATININE 0.91 0.65 0.88  CALCIUM 9.6 9.4 9.4  TSH 0.596  --  2.180   Liver Function Tests:  Recent Labs  03/23/14 0854  AST 26  ALT 9  ALKPHOS 108  BILITOT 0.5  PROT 5.7*   No results for input(s): LIPASE, AMYLASE in the last 8760 hours. No results for input(s): AMMONIA in the last 8760 hours. CBC:  Recent Labs  08/27/13 1506 02/10/14 1131 03/23/14 0854  WBC 5.2 7.8 6.0  NEUTROABS 3.8  --  2.7  HGB 11.5 13.5 13.4  HCT 34.7 42.1 39.8  MCV 91 93.3 90  PLT 314 195 225   Lipid Panel:  Recent Labs  03/23/14 0854  HDL 86  LDLCALC 130*  TRIG 115  CHOLHDL 2.8   Assessment/Plan 1. Leg edema, left - suspect this is chronic venous stasis based on appearance;  Was asymmetric and seems to be present postop -want to ensure her systolic function is wnl wo check echo esp with RA as CAD equivalent - 2D Echocardiogram without contrast; Future  2. Chronic venous insufficiency -bilateral skin changes consistent with this -cont compression hose in daytime -stopped lasix--advised if she gains weight or edema gets dramatically worse before the echo  is done  3. Spinal stenosis in cervical region -s/p laminectomy  4. Rheumatoid arthritis -cont mgt as per rheumatology -she is immunocompromised  5. Ulcerative colitis, without complications -cont current therapy, no recent flares  6. Need for vaccination with 13-polyvalent pneumococcal conjugate vaccine -cont prevnar  Labs/tests ordered:   Orders Placed This Encounter  Procedures  . Pneumococcal conjugate vaccine 13-valent  . 2D Echocardiogram without contrast    Edema postoperatively    Standing Status: Future     Number of Occurrences:      Standing Expiration  Date: 03/27/2015    Order Specific Question:  Type of Echo    Answer:  Complete    Order Specific Question:  Where should this test be performed    Answer:  [facility]    Order Specific Question:  Reason for exam-Echo    Answer:  CHF-Acute Systolic  711.65 / B90.38    Next appt:  3 mos   Pansie Guggisberg L. Deondre Marinaro, D.O. [facility] [address], [place] [address] Cell Phone (Mon-Fri 8am-5pm):  [phone] On Call:  [phone] & follow prompts after 5pm & weekends Office Phone:  [phone] Office Fax:  [phone]

## 2014-04-03 ENCOUNTER — Other Ambulatory Visit: Payer: Self-pay | Admitting: Internal Medicine

## 2014-04-17 ENCOUNTER — Ambulatory Visit (HOSPITAL_COMMUNITY): Payer: Medicare Other | Attending: Internal Medicine | Admitting: Radiology

## 2014-04-17 ENCOUNTER — Other Ambulatory Visit (HOSPITAL_COMMUNITY): Payer: Self-pay | Admitting: Internal Medicine

## 2014-04-17 DIAGNOSIS — R600 Localized edema: Secondary | ICD-10-CM

## 2014-04-17 DIAGNOSIS — I10 Essential (primary) hypertension: Secondary | ICD-10-CM | POA: Diagnosis not present

## 2014-04-17 DIAGNOSIS — I5021 Acute systolic (congestive) heart failure: Secondary | ICD-10-CM

## 2014-04-17 NOTE — Progress Notes (Signed)
Echocardiogram performed.  

## 2014-04-21 ENCOUNTER — Telehealth: Payer: Self-pay | Admitting: *Deleted

## 2014-05-11 ENCOUNTER — Ambulatory Visit (INDEPENDENT_AMBULATORY_CARE_PROVIDER_SITE_OTHER): Payer: Medicare Other | Admitting: Podiatry

## 2014-05-11 ENCOUNTER — Encounter: Payer: Self-pay | Admitting: Podiatry

## 2014-05-11 DIAGNOSIS — B351 Tinea unguium: Secondary | ICD-10-CM

## 2014-05-11 NOTE — Progress Notes (Signed)
   Subjective:    Patient ID: [name], female    DOB: Dec 14, 1934, 80 y.o.   MRN: [id number]  HPI Pt presents for nail debridement. She denies any pain in the nails   Review of Systems     Objective:   Physical Exam  The toenails are discolored, incurvated, brittle 6-10       Assessment & Plan:   Assessment: Asymptomatic onychomycoses 6-10  Plan: Debridement toenails 10  Reappoint at patient's request

## 2014-06-10 ENCOUNTER — Telehealth: Payer: Self-pay

## 2014-06-10 MED ORDER — DOXYCYCLINE HYCLATE 100 MG PO CAPS: 100.0000 mg | ORAL_CAPSULE | Freq: Two times a day (BID) | ORAL | Status: DC

## 2014-06-10 MED ORDER — SACCHAROMYCES BOULARDII 250 MG PO CAPS: 250.0000 mg | ORAL_CAPSULE | Freq: Two times a day (BID) | ORAL | Status: DC

## 2014-06-10 NOTE — Telephone Encounter (Signed)
Let's start her on doxycycline 100mg  po bid for 10 days and florastor 250mg  po bid for 14 days.  Typically immunosuppressant meds must be held during acute infection.  She is on arava pills and humira injections. I would still like to see her when there is an opening and she can come in to make sure she is recovering.

## 2014-06-10 NOTE — Telephone Encounter (Signed)
Patient aware of Dr.[name]'s response. Patient was very grateful. Patient already with pending appointment. RX's sent to pharmacy on file

## 2014-06-10 NOTE — Telephone Encounter (Signed)
Patient left message on triage phone, patient c/o left leg swelling and redness. Patient said the last time this happened she was sent to hospital x 4 days and dx with cellulitis.   I called patient, no available appointments today. I offered patient to be seen tomorrow, patient unable to come in Thursday or Friday due to other engagements. Please advise

## 2014-06-12 ENCOUNTER — Ambulatory Visit
Admission: RE | Admit: 2014-06-12 | Discharge: 2014-06-12 | Disposition: A | Discharge status: Home / Self Care | Payer: Medicare Other | Source: Ambulatory Visit | Attending: Physician Assistant | Admitting: Physician Assistant

## 2014-06-12 ENCOUNTER — Other Ambulatory Visit: Payer: Self-pay | Admitting: Physician Assistant

## 2014-06-12 DIAGNOSIS — M25562 Pain in left knee: Secondary | ICD-10-CM

## 2014-06-13 ENCOUNTER — Inpatient Hospital Stay (HOSPITAL_COMMUNITY)
Admission: EM | Admit: 2014-06-13 | Discharge: 2014-06-17 | DRG: 470 | Disposition: A | Discharge status: Skilled Nursing Facility | Payer: Medicare Other | Attending: Internal Medicine | Admitting: Internal Medicine

## 2014-06-13 ENCOUNTER — Other Ambulatory Visit: Payer: Self-pay | Admitting: Internal Medicine

## 2014-06-13 ENCOUNTER — Telehealth: Payer: Self-pay | Admitting: Internal Medicine

## 2014-06-13 ENCOUNTER — Encounter (HOSPITAL_COMMUNITY): Payer: Self-pay | Admitting: *Deleted

## 2014-06-13 ENCOUNTER — Emergency Department (HOSPITAL_COMMUNITY): Payer: Medicare Other

## 2014-06-13 DIAGNOSIS — Z7952 Long term (current) use of systemic steroids: Secondary | ICD-10-CM | POA: Diagnosis not present

## 2014-06-13 DIAGNOSIS — I10 Essential (primary) hypertension: Secondary | ICD-10-CM | POA: Diagnosis present

## 2014-06-13 DIAGNOSIS — Z8249 Family history of ischemic heart disease and other diseases of the circulatory system: Secondary | ICD-10-CM

## 2014-06-13 DIAGNOSIS — M8580 Other specified disorders of bone density and structure, unspecified site: Secondary | ICD-10-CM | POA: Diagnosis present

## 2014-06-13 DIAGNOSIS — M069 Rheumatoid arthritis, unspecified: Secondary | ICD-10-CM | POA: Diagnosis not present

## 2014-06-13 DIAGNOSIS — E876 Hypokalemia: Secondary | ICD-10-CM | POA: Diagnosis present

## 2014-06-13 DIAGNOSIS — H409 Unspecified glaucoma: Secondary | ICD-10-CM | POA: Diagnosis present

## 2014-06-13 DIAGNOSIS — S82142A Displaced bicondylar fracture of left tibia, initial encounter for closed fracture: Secondary | ICD-10-CM | POA: Diagnosis present

## 2014-06-13 DIAGNOSIS — S42009A Fracture of unspecified part of unspecified clavicle, initial encounter for closed fracture: Secondary | ICD-10-CM | POA: Diagnosis not present

## 2014-06-13 DIAGNOSIS — Z96652 Presence of left artificial knee joint: Secondary | ICD-10-CM

## 2014-06-13 DIAGNOSIS — M659 Synovitis and tenosynovitis, unspecified: Secondary | ICD-10-CM | POA: Diagnosis present

## 2014-06-13 DIAGNOSIS — S42002A Fracture of unspecified part of left clavicle, initial encounter for closed fracture: Secondary | ICD-10-CM | POA: Diagnosis not present

## 2014-06-13 DIAGNOSIS — M2500 Hemarthrosis, unspecified joint: Secondary | ICD-10-CM | POA: Diagnosis not present

## 2014-06-13 DIAGNOSIS — M810 Age-related osteoporosis without current pathological fracture: Secondary | ICD-10-CM | POA: Diagnosis present

## 2014-06-13 DIAGNOSIS — S42032A Displaced fracture of lateral end of left clavicle, initial encounter for closed fracture: Secondary | ICD-10-CM | POA: Diagnosis not present

## 2014-06-13 DIAGNOSIS — E785 Hyperlipidemia, unspecified: Secondary | ICD-10-CM | POA: Diagnosis present

## 2014-06-13 DIAGNOSIS — F17210 Nicotine dependence, cigarettes, uncomplicated: Secondary | ICD-10-CM | POA: Diagnosis present

## 2014-06-13 DIAGNOSIS — Z7982 Long term (current) use of aspirin: Secondary | ICD-10-CM | POA: Diagnosis not present

## 2014-06-13 DIAGNOSIS — D62 Acute posthemorrhagic anemia: Secondary | ICD-10-CM | POA: Diagnosis not present

## 2014-06-13 DIAGNOSIS — Z79899 Other long term (current) drug therapy: Secondary | ICD-10-CM | POA: Diagnosis not present

## 2014-06-13 DIAGNOSIS — Z79891 Long term (current) use of opiate analgesic: Secondary | ICD-10-CM | POA: Diagnosis not present

## 2014-06-13 DIAGNOSIS — Z801 Family history of malignant neoplasm of trachea, bronchus and lung: Secondary | ICD-10-CM | POA: Diagnosis not present

## 2014-06-13 DIAGNOSIS — J42 Unspecified chronic bronchitis: Secondary | ICD-10-CM | POA: Diagnosis present

## 2014-06-13 DIAGNOSIS — W1800XA Striking against unspecified object with subsequent fall, initial encounter: Secondary | ICD-10-CM | POA: Diagnosis present

## 2014-06-13 DIAGNOSIS — Y92012 Bathroom of single-family (private) house as the place of occurrence of the external cause: Secondary | ICD-10-CM

## 2014-06-13 DIAGNOSIS — R55 Syncope and collapse: Secondary | ICD-10-CM

## 2014-06-13 DIAGNOSIS — M179 Osteoarthritis of knee, unspecified: Secondary | ICD-10-CM | POA: Diagnosis present

## 2014-06-13 DIAGNOSIS — X58XXXA Exposure to other specified factors, initial encounter: Secondary | ICD-10-CM | POA: Diagnosis present

## 2014-06-13 DIAGNOSIS — K5190 Ulcerative colitis, unspecified, without complications: Secondary | ICD-10-CM | POA: Diagnosis present

## 2014-06-13 DIAGNOSIS — E039 Hypothyroidism, unspecified: Secondary | ICD-10-CM | POA: Diagnosis present

## 2014-06-13 DIAGNOSIS — M25512 Pain in left shoulder: Secondary | ICD-10-CM | POA: Diagnosis present

## 2014-06-13 LAB — BASIC METABOLIC PANEL
Anion gap: 12 (ref 5–15)
BUN: 21 mg/dL (ref 6–23)
CO2: 29 mmol/L (ref 19–32)
Calcium: 8.5 mg/dL (ref 8.4–10.5)
Chloride: 100 mmol/L (ref 96–112)
Creatinine, Ser: 0.74 mg/dL (ref 0.50–1.10)
GFR calc Af Amer: >90 mL/min (ref >90)
GFR calc non Af Amer: 80 mL/min — ABNORMAL LOW (ref >90)
Glucose, Bld: 102 mg/dL — ABNORMAL HIGH (ref 70–99)
Potassium: 3.1 mmol/L — ABNORMAL LOW (ref 3.5–5.1)
Sodium: 141 mmol/L (ref 135–145)

## 2014-06-13 LAB — CBC
HCT: 44.8 % (ref 36.0–46.0)
Hemoglobin: 14.8 g/dL (ref 12.0–15.0)
MCH: 30.5 pg (ref 26.0–34.0)
MCHC: 33 g/dL (ref 30.0–36.0)
MCV: 92.4 fL (ref 78.0–100.0)
Platelets: 218 10*3/uL (ref 150–400)
RBC: 4.85 MIL/uL (ref 3.87–5.11)
RDW: 14.4 % (ref 11.5–15.5)
WBC: 9.2 10*3/uL (ref 4.0–10.5)

## 2014-06-13 LAB — CBG MONITORING, ED: Glucose-Capillary: 95 mg/dL (ref 70–99)

## 2014-06-13 MED ORDER — ENOXAPARIN SODIUM 40 MG/0.4ML ~~LOC~~ SOLN
40.0000 mg | SUBCUTANEOUS | Status: DC
  Administered 2014-06-13 – 2014-06-14 (×2): 40 mg via SUBCUTANEOUS
  Filled 2014-06-13 (×4): qty 0.4

## 2014-06-13 MED ORDER — LATANOPROST 0.005 % OP SOLN
1.0000 [drp] | Freq: Every day | OPHTHALMIC | Status: DC
  Administered 2014-06-13 – 2014-06-16 (×4): 1 [drp] via OPHTHALMIC
  Filled 2014-06-13: qty 2.5

## 2014-06-13 MED ORDER — LEFLUNOMIDE 20 MG PO TABS
20.0000 mg | ORAL_TABLET | Freq: Every day | ORAL | Status: DC
  Administered 2014-06-13 – 2014-06-17 (×4): 20 mg via ORAL
  Filled 2014-06-13 (×5): qty 1

## 2014-06-13 MED ORDER — POTASSIUM CHLORIDE CRYS ER 20 MEQ PO TBCR
40.0000 meq | EXTENDED_RELEASE_TABLET | Freq: Once | ORAL | Status: AC
  Administered 2014-06-13: 40 meq via ORAL
  Filled 2014-06-13: qty 2

## 2014-06-13 MED ORDER — ONDANSETRON HCL 4 MG PO TABS: 4.0000 mg | ORAL_TABLET | Freq: Four times a day (QID) | ORAL | Status: DC | PRN

## 2014-06-13 MED ORDER — HYDROCODONE-ACETAMINOPHEN 5-325 MG PO TABS: 1.0000 | ORAL_TABLET | Freq: Four times a day (QID) | ORAL | Status: DC | PRN

## 2014-06-13 MED ORDER — DIAZEPAM 5 MG PO TABS
5.0000 mg | ORAL_TABLET | Freq: Four times a day (QID) | ORAL | Status: DC | PRN
  Administered 2014-06-14 – 2014-06-16 (×6): 5 mg via ORAL
  Filled 2014-06-13 (×6): qty 1

## 2014-06-13 MED ORDER — CELECOXIB 200 MG PO CAPS
200.0000 mg | ORAL_CAPSULE | Freq: Two times a day (BID) | ORAL | Status: DC
  Administered 2014-06-13 – 2014-06-17 (×6): 200 mg via ORAL
  Filled 2014-06-13 (×11): qty 1

## 2014-06-13 MED ORDER — SODIUM CHLORIDE 0.9 % IV BOLUS (SEPSIS)
1000.0000 mL | Freq: Once | INTRAVENOUS | Status: AC
  Administered 2014-06-13: 1000 mL via INTRAVENOUS

## 2014-06-13 MED ORDER — LEVOTHYROXINE SODIUM 88 MCG PO TABS
88.0000 ug | ORAL_TABLET | Freq: Every day | ORAL | Status: DC
  Administered 2014-06-14 – 2014-06-17 (×4): 88 ug via ORAL
  Filled 2014-06-13 (×6): qty 1

## 2014-06-13 MED ORDER — HYDROCHLOROTHIAZIDE 25 MG PO TABS
25.0000 mg | ORAL_TABLET | Freq: Every day | ORAL | Status: DC
  Administered 2014-06-14 – 2014-06-17 (×4): 25 mg via ORAL
  Filled 2014-06-13 (×6): qty 1

## 2014-06-13 MED ORDER — GUAIFENESIN ER 600 MG PO TB12: 1200.0000 mg | ORAL_TABLET | Freq: Every day | ORAL | Status: DC | PRN

## 2014-06-13 MED ORDER — HYDROCODONE-ACETAMINOPHEN 5-325 MG PO TABS
1.0000 | ORAL_TABLET | ORAL | Status: DC | PRN
  Administered 2014-06-14 – 2014-06-16 (×9): 1 via ORAL
  Administered 2014-06-17 (×3): 2 via ORAL
  Filled 2014-06-13 (×3): qty 1
  Filled 2014-06-13: qty 2
  Filled 2014-06-13 (×2): qty 1
  Filled 2014-06-13 (×2): qty 2
  Filled 2014-06-13: qty 1
  Filled 2014-06-13: qty 2
  Filled 2014-06-13 (×2): qty 1

## 2014-06-13 MED ORDER — ADULT MULTIVITAMIN W/MINERALS CH
1.0000 | ORAL_TABLET | Freq: Every day | ORAL | Status: DC
  Administered 2014-06-13 – 2014-06-17 (×4): 1 via ORAL
  Filled 2014-06-13 (×5): qty 1

## 2014-06-13 MED ORDER — OXYCODONE-ACETAMINOPHEN 5-325 MG PO TABS
1.0000 | ORAL_TABLET | ORAL | Status: DC | PRN
  Administered 2014-06-13: 1 via ORAL
  Filled 2014-06-13: qty 1

## 2014-06-13 MED ORDER — SACCHAROMYCES BOULARDII 250 MG PO CAPS
250.0000 mg | ORAL_CAPSULE | Freq: Two times a day (BID) | ORAL | Status: DC
  Administered 2014-06-15 – 2014-06-17 (×4): 250 mg via ORAL
  Filled 2014-06-13 (×9): qty 1

## 2014-06-13 MED ORDER — HYDROMORPHONE HCL 1 MG/ML IJ SOLN
1.0000 mg | Freq: Once | INTRAMUSCULAR | Status: AC
  Administered 2014-06-13: 1 mg via INTRAVENOUS
  Filled 2014-06-13: qty 1

## 2014-06-13 MED ORDER — MEDROXYPROGESTERONE ACETATE 2.5 MG PO TABS
2.5000 mg | ORAL_TABLET | Freq: Every day | ORAL | Status: DC
  Administered 2014-06-13 – 2014-06-15 (×3): 2.5 mg via ORAL
  Filled 2014-06-13 (×5): qty 1

## 2014-06-13 MED ORDER — ONDANSETRON HCL 4 MG/2ML IJ SOLN: 4.0000 mg | Freq: Four times a day (QID) | INTRAMUSCULAR | Status: DC | PRN

## 2014-06-13 MED ORDER — PREDNISONE 5 MG PO TABS
5.0000 mg | ORAL_TABLET | Freq: Every day | ORAL | Status: DC
  Administered 2014-06-14 – 2014-06-16 (×3): 5 mg via ORAL
  Filled 2014-06-13 (×5): qty 1

## 2014-06-13 MED ORDER — ASPIRIN EC 81 MG PO TBEC
81.0000 mg | DELAYED_RELEASE_TABLET | ORAL | Status: DC
Start: 2014-06-14 — End: 2014-06-16
  Filled 2014-06-13 (×2): qty 1

## 2014-06-13 MED ORDER — OXYCODONE HCL 5 MG PO TABS: 5.0000 mg | ORAL_TABLET | ORAL | Status: DC | PRN

## 2014-06-13 MED ORDER — FOLIC ACID 1 MG PO TABS
2.0000 mg | ORAL_TABLET | Freq: Every day | ORAL | Status: DC
  Administered 2014-06-13 – 2014-06-17 (×4): 2 mg via ORAL
  Filled 2014-06-13 (×5): qty 2

## 2014-06-13 MED ORDER — KETOROLAC TROMETHAMINE 15 MG/ML IJ SOLN
15.0000 mg | Freq: Once | INTRAMUSCULAR | Status: AC
  Administered 2014-06-13: 15 mg via INTRAVENOUS
  Filled 2014-06-13: qty 1

## 2014-06-13 NOTE — H&P (Signed)
[facility] History and Physical  [name] OIN:[id number] DOB: 14-Dec-1934 DOA: 06/13/2014  Referring physician: ED physician PCP: Hollace Kinnier, DO   Chief Complaint: syncope   HPI:  Pt is 80 yo female who recently suffered left tibial fracture, sees Dr. Alvan Dame, presented to [facility] ED after an episode of lightheadedness that occurred earlier in the day prior to this admission. Pt explains she went to there bathroom and was sitting on the toilet and when she stood up she felt lightheaded and fell down hitting her left shoulder. She is now concerned with persistent left shoulder pain that is throbbing and sharp, 5/10 in severity, non radiating, no specific alleviating factors, worse with movement. She denies chest pain or shortness of breath, no specific abdominal or urinary concerns, no similar events in the past. Pt denies any numbness or tingling, no focal neurological symptoms.   In ED, pt noted to be hemodynamically stable, VSS, imaging studies notable for clavicular fracture. SW met with pt and able to find placement for AM. TRH asked to admit for observation overnight and pain control.  Assessment and Plan: Active Problems: Syncopal event - unclear what is the precipitation factor - check orthostatic vitals, 12 lead EKG - blood work unremarkable, will repeat tests in AM - PT evaluation in AM - since pt denies chest pain or shortness of breath, will hold off on CE's Acute left clavicular fracture - OT evaluation in AM - stabilize and plan on d/c to SNF in AM - continue to provide analgesia as needed  Left tibial fracture - has seen Dr. Alvan Dame - will notify him of pt's admission  Hypokalemia - supplement and repeat BMP in AM Hypothyroidism - continue synthroid HTN, accelerated  - continue HCTZ RA - on prednisone and humira, continue here  DVT prophylaxis - Lovenox SQ  Radiological Exams on Admission: Ct Knee Left Wo Contrast  06/12/2014   Comminuted depressed  intra-articular fracture of the posterior aspect of the lateral tibial plateau as described. 2. Associated hemarthrosis and probable synovitis. 3. Hyperextension of the knee suggesting possible ACL injury.    Ct 3d Independent Wkst  06/12/2014   Comminuted depressed intra-articular fracture of the posterior aspect of the lateral tibial plateau as described. 2. Associated hemarthrosis and probable synovitis. 3. Hyperextension of the knee suggesting possible ACL injury.     Dg Shoulder Left  06/13/2014   Acute minimally displaced left distal clavicle fracture.  Osteopenia  Healed proximal left humerus surgical neck fracture with deformity     Code Status: Full Family Communication: Pt at bedside Disposition Plan: Admit for further evaluation    Mart Piggs [facility] [phone]  Review of Systems:  Constitutional: Negative for fever, chills and malaise/fatigue. Negative for diaphoresis.  HENT: Negative for hearing loss, ear pain, nosebleeds, congestion, sore throat, neck pain, tinnitus and ear discharge.   Eyes: Negative for blurred vision, double vision, photophobia, pain, discharge and redness.  Respiratory: Negative for cough, hemoptysis, sputum production, shortness of breath, wheezing and stridor.   Cardiovascular: Negative for chest pain, palpitations, orthopnea, claudication and leg swelling.  Gastrointestinal: Negative for nausea, vomiting and abdominal pain. Negative for heartburn, constipation, blood in stool and melena.  Genitourinary: Negative for dysuria, urgency, frequency, hematuria and flank pain.  Musculoskeletal: Negative for myalgias, back pain, joint pain and falls.  Skin: Negative for itching and rash.  Neurological: Negative for dizziness and weakness. Negative for tingling, tremors, sensory change, speech change, focal weakness, loss of consciousness and headaches.  Endo/Heme/Allergies: Negative  for environmental allergies and polydipsia. Does not bruise/bleed easily.   Psychiatric/Behavioral: Negative for suicidal ideas. The patient is not nervous/anxious.      Past Medical History  Diagnosis Date  . Ulcerative colitis   . Hypothyroidism   . Hypertension     pcp   dr reed   [facility]  . Pneumonia 2008  . Chronic bronchitis 10/31/2011    "prone to it; I have sinus/bronchitis problem probably q yr"  . Multiple skin tears 10/31/2011    "get them very easily; my skin is thin and I bruise easily"  . Chronic back pain     "my entire spine"  . Edema   . Other and unspecified hyperlipidemia   . Encounter for long-term (current) use of other medications   . Senile osteoporosis   . Spinal stenosis, unspecified region other than cervical   . Unspecified glaucoma   . Hypothyroidism   . Complication of anesthesia 2009    htn with block- for hand surgery   . Anxiety     sometimes   . Rheumatoid arthritis(714.0)     degenerative disc disease     Past Surgical History  Procedure Laterality Date  . Back surgery    . Sinus surgery with instatrak    . Thyroidectomy, partial  1997  . Posterior fusion lumbar spine  10/31/2011    L2-3; 3-4  . Tonsillectomy      "when I was a small child"  . Functional endoscopic sinus surgery  1988  . Posterior fusion lumbar spine  11/2009    L3-4;  L4-5  . Joint replacement  2009; 2006    joints 2 fingers,rt hand; joint left thumb  . Spine surgery    . Posterior lumbar fusion N/A 08/05/13    T1, T2  . Eye surgery    . Anterior cervical decomp/discectomy fusion N/A 02/17/2014    Procedure: CERVICAL FOUR TO FIVE ANTERIOR CERVICAL DECOMPRESSION/DISCECTOMY FUSION 1 LEVEL;  Surgeon: [name], MD;  Location: [facility];  Service: Neurosurgery;  Laterality: N/A;  C4-5 Anterior cervical decompression/diskectomy/fusion  . Cataract extraction Left 11/11/2013  . Cataract extraction Right 12/30/2013    Social History:  reports that she has been passively smoking Cigarettes.  She has a 12.5 pack-year smoking history.  She has never used smokeless tobacco. She reports that she drinks about 2.4 oz of alcohol per week. She reports that she does not use illicit drugs.  No Known Allergies  Family History  Problem Relation Age of Onset  . Heart disease Mother   . Cancer Father     lung    Prior to Admission medications   Medication Sig Start Date End Date Taking? Authorizing Provider  aspirin EC 81 MG tablet Take 81 mg by mouth every other day.   Yes Historical Provider, MD  balsalazide (COLAZAL) 750 MG capsule Take 2,250 mg by mouth 2 (two) times daily.    Yes Historical Provider, MD  Calcium Carb-Cholecalciferol (CALCIUM + D3) 600-200 MG-UNIT TABS Take 1 tablet by mouth daily.   Yes Historical Provider, MD  CELEBREX 200 MG capsule Take 200 mg by mouth 2 (two) times daily.  07/15/12  Yes Historical Provider, MD  Cholecalciferol (VITAMIN D3) 1000 UNITS CAPS Take 1,000 Units by mouth daily.   Yes Historical Provider, MD  diazepam (VALIUM) 5 MG tablet Take 1 tablet (5 mg total) by mouth every 6 (six) hours as needed for muscle spasms. 02/17/14  Yes [name], MD  estradiol (ESTRACE) 0.5 MG tablet Take 0.5 mg by mouth daily.  06/18/12  Yes Historical Provider, MD  folic acid (FOLVITE) 1 MG tablet Take 2 mg by mouth daily.   Yes Historical Provider, MD  furosemide (LASIX) 20 MG tablet 1 by mouth daily as needed 04/06/14  Yes [name] L [name], DO  guaiFENesin (MUCINEX) 600 MG 12 hr tablet Take 1,200 mg by mouth daily as needed (sinus relief).    Yes Historical Provider, MD  hydrochlorothiazide (HYDRODIURIL) 25 MG tablet Take 25 mg by mouth daily with breakfast. 07/30/13  Yes Lauree Chandler, NP  HYDROcodone-acetaminophen (NORCO/VICODIN) 5-325 MG per tablet Take 1 tablet by mouth every 6 (six) hours as needed (pain).  12/11/13  Yes Historical Provider, MD  latanoprost (XALATAN) 0.005 % ophthalmic solution Place 1 drop into both eyes at bedtime.   Yes Historical Provider, MD  leflunomide (ARAVA) 20 MG tablet Take 20 mg by  mouth daily. 05/07/14  Yes Historical Provider, MD  levothyroxine (SYNTHROID) 88 MCG tablet Take 88 mcg by mouth daily before breakfast. DAW, DO NOT SUBSTITUTE WITH GENERIC   Yes Historical Provider, MD  medroxyPROGESTERone (PROVERA) 2.5 MG tablet Take 2.5 mg by mouth at bedtime.  06/02/13  Yes Historical Provider, MD  Multiple Vitamin (MULTIVITAMIN WITH MINERALS) TABS Take 1 tablet by mouth daily.   Yes Historical Provider, MD  Omega-3 Fatty Acids (FISH OIL) 1200 MG CAPS Take 1,200 mg by mouth daily.   Yes Historical Provider, MD  oxyCODONE-acetaminophen (PERCOCET/ROXICET) 5-325 MG per tablet Take 1-2 tablets by mouth every 4 (four) hours as needed for moderate pain. 02/17/14  Yes [name], MD  predniSONE (DELTASONE) 5 MG tablet Take 5 mg by mouth daily with breakfast.    Yes Historical Provider, MD  adalimumab (HUMIRA) 40 MG/0.8ML injection Inject 0.8 mLs (40 mg total) into the skin every 14 (fourteen) days. Resume ONLY if off all antibiotics 11/24/11   Jonetta Osgood, MD  doxycycline (VIBRAMYCIN) 100 MG capsule Take 1 capsule (100 mg total) by mouth 2 (two) times daily. Patient not taking: Reported on 06/13/2014 06/10/14   [name] L [name], DO  saccharomyces boulardii (FLORASTOR) 250 MG capsule Take 1 capsule (250 mg total) by mouth 2 (two) times daily. Patient not taking: Reported on 06/13/2014 06/10/14   Gayland Curry, DO    Physical Exam: Filed Vitals:   06/13/14 1357 06/13/14 1427 06/13/14 1435 06/13/14 1530  BP: 159/70  138/77 170/91  Pulse: 66  54 59  Temp:  98 F (36.7 C)    TempSrc:  Oral    Resp: '22  15 14  ' SpO2: 100%  100% 100%    Physical Exam  Constitutional: Appears well-developed and well-nourished. No distress.  HENT: Normocephalic. External right and left ear normal. Oropharynx is clear and moist.  Eyes: Conjunctivae and EOM are normal. PERRLA, no scleral icterus.  Neck: Normal ROM. Neck supple. No JVD. No tracheal deviation. No thyromegaly.  CVS: RRR, S1/S2 +,no  gallops, no carotid bruit.  Pulmonary: Effort and breath sounds normal, no stridor, rhonchi, wheezes, rales.  Abdominal: Soft. BS +,  no distension, tenderness, rebound or guarding.  Musculoskeletal: Difficult ot examine left arm ROM due to pain, strength 5/5 in upper and lower extremities  Lymphadenopathy: No lymphadenopathy noted, cervical, inguinal. Neuro: Alert. Normal reflexes, muscle tone coordination. No cranial nerve deficit. Skin: Skin is warm and dry. No rash noted. Not diaphoretic. No erythema. No pallor.  Psychiatric: Normal mood and affect.   Labs on Admission:  Basic Metabolic Panel:  Recent Labs Lab 06/13/14 1407  NA 141  K 3.1*  CL 100  CO2 29  GLUCOSE 102*  BUN 21  CREATININE 0.74  CALCIUM 8.5   CBC:  Recent Labs Lab 06/13/14 1407  WBC 9.2  HGB 14.8  HCT 44.8  MCV 92.4  PLT 218   CBG:  Recent Labs Lab 06/13/14 1412  GLUCAP 95    EKG: pending    If 7PM-7AM, please contact night-coverage [email or web address] Password [email or web address] 06/13/2014, 4:53 PM

## 2014-06-13 NOTE — ED Notes (Addendum)
Per ems pt is from home, reports near syncopal/syncopal episode at 0730 while sitting on the toilet. Pt unsure of LOC. Pt reports she felt dizzy, laid her head down on lap and then ended up on the floor. Reports she hit her head on the plastic trashcan, hematoma to left temporal region of head. Left shoulder hit the floor. Alert and oriented x4. Left shoulder pain 3/10. Hx of arthritis, but reports this shoulder pain is worse today.    Reports feeling light headed over last week or two. Takes lasix for fluid management. Pt usually walks with a walker at home, fracture to left patella, suppose to have surgery in 2 weeks or so. Had to come off humera medication before surgery. Left knee brace which is to not be removed.

## 2014-06-13 NOTE — ED Notes (Signed)
Pt alert and oriented x4. Respirations even and unlabored, bilateral symmetrical rise and fall of chest. Skin warm and dry. In no acute distress. Denies needs.   

## 2014-06-13 NOTE — Telephone Encounter (Signed)
Spoke with [name] and her husband Barnabas Lister this morning about CT results of her left knee that I discovered in my inbasket.  She had been complaining of redness and swelling of her leg which she thought was infection on 3/30--she'd been unable to come in for an appt, so abx were initially prescribed at her request.  She then went to urgent care?? yesterday (no notes in epic), 06/12/14, and her knee was imaged there revealing a fracture of the tibia near her knee.  The CT was then ordered for further assessment to determine (per her husband) if the fracture had extended down the leg.    CT left knee w/o contrast:  1. Comminuted depressed intra-articular fracture of the posterior aspect of the lateral tibial plateau as described. 2. Associated hemarthrosis and probable synovitis. 3. Hyperextension of the knee suggesting possible ACL injury.  She had not yet received these results.  Fortunately, there were no signs of infection when she was seen.    She was quite "whoozy" this am, got up to use the toilet and had a bad fall, striking her head, shoulder, and elbow.  She is quite osteoporotic and already has the knee injury above.  She has RA on immunosuppressive agents and prednisone.  She is now unable to ambulate.  Her husband asked if she should be transported to the ED, and I said, yes, I recommended she go to get more imaging ASAP.

## 2014-06-13 NOTE — ED Notes (Signed)
md at bedside

## 2014-06-13 NOTE — ED Provider Notes (Signed)
CSN: [id number]     Arrival date & time 06/13/14  1345 History   First MD Initiated Contact with Patient 06/13/14 1354     Chief Complaint  Patient presents with  . Loss of Consciousness     (Consider location/radiation/quality/duration/timing/severity/associated sxs/prior Treatment) HPI   79yF with syncope/near syncope around 0730 this morning. Was sitting on toilet when began to feel lightheaded. Went to lay her head down and then fell to floor. May have had brief LOC. Struck L shoulder when fell. Persistent pain in shoulder since. She did hit her head as well but denies HA. No acute visual changes. No acute numbness, tingling or loss of strength. Recent L tibial plateau fracture and currently using walker with upcoming surgery planned. Denies preceding CP, SOB, palpitations or currently.    Past Medical History  Diagnosis Date  . Ulcerative colitis   . Hypothyroidism   . Hypertension     pcp   dr reed   [facility]  . Pneumonia 2008  . Chronic bronchitis 10/31/2011    "prone to it; I have sinus/bronchitis problem probably q yr"  . Multiple skin tears 10/31/2011    "get them very easily; my skin is thin and I bruise easily"  . Chronic back pain     "my entire spine"  . Edema   . Other and unspecified hyperlipidemia   . Encounter for long-term (current) use of other medications   . Senile osteoporosis   . Spinal stenosis, unspecified region other than cervical   . Unspecified glaucoma   . Hypothyroidism   . Complication of anesthesia 2009    htn with block- for hand surgery   . Anxiety     sometimes   . Rheumatoid arthritis(714.0)     degenerative disc disease    Past Surgical History  Procedure Laterality Date  . Back surgery    . Sinus surgery with instatrak    . Thyroidectomy, partial  1997  . Posterior fusion lumbar spine  10/31/2011    L2-3; 3-4  . Tonsillectomy      "when I was a small child"  . Functional endoscopic sinus surgery  1988  . Posterior fusion  lumbar spine  11/2009    L3-4;  L4-5  . Joint replacement  2009; 2006    joints 2 fingers,rt hand; joint left thumb  . Spine surgery    . Posterior lumbar fusion N/A 08/05/13    T1, T2  . Eye surgery    . Anterior cervical decomp/discectomy fusion N/A 02/17/2014    Procedure: CERVICAL FOUR TO FIVE ANTERIOR CERVICAL DECOMPRESSION/DISCECTOMY FUSION 1 LEVEL;  Surgeon: [name], MD;  Location: [facility];  Service: Neurosurgery;  Laterality: N/A;  C4-5 Anterior cervical decompression/diskectomy/fusion  . Cataract extraction Left 11/11/2013  . Cataract extraction Right 12/30/2013   Family History  Problem Relation Age of Onset  . Heart disease Mother   . Cancer Father     lung   History  Substance Use Topics  . Smoking status: Passive Smoke Exposure - Never Smoker -- 0.25 packs/day for 50 years    Types: Cigarettes  . Smokeless tobacco: Never Used     Comment: 10/31/2011  offered smoking cessation materials; pt declines; "I puff; I've never really inhaled"  . Alcohol Use: 2.4 oz/week    2 Glasses of wine, 2 Cans of beer per week     Comment: 10/31/2011 "1 beers/wine with dinner daily ; an occasional scotch"   OB History  No data available     Review of Systems  All systems reviewed and negative, other than as noted in HPI.   Allergies  Review of patient's allergies indicates no known allergies.  Home Medications   Prior to Admission medications   Medication Sig Start Date End Date Taking? Authorizing Provider  aspirin EC 81 MG tablet Take 81 mg by mouth every other day.   Yes Historical Provider, MD  balsalazide (COLAZAL) 750 MG capsule Take 2,250 mg by mouth 2 (two) times daily.    Yes Historical Provider, MD  Calcium Carb-Cholecalciferol (CALCIUM + D3) 600-200 MG-UNIT TABS Take 1 tablet by mouth daily.   Yes Historical Provider, MD  CELEBREX 200 MG capsule Take 200 mg by mouth 2 (two) times daily.  07/15/12  Yes Historical Provider, MD  Cholecalciferol (VITAMIN D3) 1000  UNITS CAPS Take 1,000 Units by mouth daily.   Yes Historical Provider, MD  diazepam (VALIUM) 5 MG tablet Take 1 tablet (5 mg total) by mouth every 6 (six) hours as needed for muscle spasms. 02/17/14  Yes [name], MD  estradiol (ESTRACE) 0.5 MG tablet Take 0.5 mg by mouth daily.  06/18/12  Yes Historical Provider, MD  folic acid (FOLVITE) 1 MG tablet Take 2 mg by mouth daily.   Yes Historical Provider, MD  furosemide (LASIX) 20 MG tablet 1 by mouth daily as needed 04/06/14  Yes [name] L [name], DO  guaiFENesin (MUCINEX) 600 MG 12 hr tablet Take 1,200 mg by mouth daily as needed (sinus relief).    Yes Historical Provider, MD  hydrochlorothiazide (HYDRODIURIL) 25 MG tablet Take 25 mg by mouth daily with breakfast. 07/30/13  Yes Lauree Chandler, NP  HYDROcodone-acetaminophen (NORCO/VICODIN) 5-325 MG per tablet Take 1 tablet by mouth every 6 (six) hours as needed (pain).  12/11/13  Yes Historical Provider, MD  latanoprost (XALATAN) 0.005 % ophthalmic solution Place 1 drop into both eyes at bedtime.   Yes Historical Provider, MD  leflunomide (ARAVA) 20 MG tablet Take 20 mg by mouth daily. 05/07/14  Yes Historical Provider, MD  levothyroxine (SYNTHROID) 88 MCG tablet Take 88 mcg by mouth daily before breakfast. DAW, DO NOT SUBSTITUTE WITH GENERIC   Yes Historical Provider, MD  medroxyPROGESTERone (PROVERA) 2.5 MG tablet Take 2.5 mg by mouth at bedtime.  06/02/13  Yes Historical Provider, MD  Multiple Vitamin (MULTIVITAMIN WITH MINERALS) TABS Take 1 tablet by mouth daily.   Yes Historical Provider, MD  Omega-3 Fatty Acids (FISH OIL) 1200 MG CAPS Take 1,200 mg by mouth daily.   Yes Historical Provider, MD  oxyCODONE-acetaminophen (PERCOCET/ROXICET) 5-325 MG per tablet Take 1-2 tablets by mouth every 4 (four) hours as needed for moderate pain. 02/17/14  Yes [name], MD  predniSONE (DELTASONE) 5 MG tablet Take 5 mg by mouth daily with breakfast.    Yes Historical Provider, MD  adalimumab (HUMIRA) 40 MG/0.8ML  injection Inject 0.8 mLs (40 mg total) into the skin every 14 (fourteen) days. Resume ONLY if off all antibiotics 11/24/11   Jonetta Osgood, MD  doxycycline (VIBRAMYCIN) 100 MG capsule Take 1 capsule (100 mg total) by mouth 2 (two) times daily. Patient not taking: Reported on 06/13/2014 06/10/14   [name] L [name], DO  saccharomyces boulardii (FLORASTOR) 250 MG capsule Take 1 capsule (250 mg total) by mouth 2 (two) times daily. Patient not taking: Reported on 06/13/2014 06/10/14   [name] L [name], DO   BP 138/77 mmHg  Pulse 54  Temp(Src) 98 F (36.7 C) (Oral)  Resp 15  SpO2 100% Physical Exam  Constitutional: She is oriented to person, place, and time. She appears well-developed and well-nourished. No distress.  HENT:  Head: Normocephalic and atraumatic.  Eyes: Conjunctivae are normal. Right eye exhibits no discharge. Left eye exhibits no discharge.  Neck: Neck supple.  Cardiovascular: Normal rate, regular rhythm and normal heart sounds.  Exam reveals no gallop and no friction rub.   No murmur heard. Pulmonary/Chest: Effort normal and breath sounds normal. No respiratory distress.  Abdominal: Soft. She exhibits no distension. There is no tenderness.  Musculoskeletal: She exhibits no edema or tenderness.  TTP lateral clavicle. Can actively range L shoulder although with increased pain. Closed injury. NVI. No midline spinal tenderness.   Neurological: She is alert and oriented to person, place, and time. No cranial nerve deficit. She exhibits normal muscle tone. Coordination normal.  Skin: Skin is warm and dry.  Small abrasion to L lateral orbital region and R hand. NO bony tenderness in these areas.   Psychiatric: She has a normal mood and affect. Her behavior is normal. Thought content normal.  Nursing note and vitals reviewed.   ED Course  Procedures (including critical care time) Labs Review Labs Reviewed  BASIC METABOLIC PANEL - Abnormal; Notable for the following:    Potassium 3.1  (*)    Glucose, Bld 102 (*)    GFR calc non Af Amer 80 (*)    All other components within normal limits  CBC  CBG MONITORING, ED    Imaging Review Ct Knee Left Wo Contrast  06/12/2014   CLINICAL DATA:  Lateral tibial plateau fracture. Severe arthritis. Preoperative evaluation.  EXAM: CT OF THE LEFT KNEE WITHOUT CONTRAST; 3-DIMENSIONAL CT IMAGE RENDERING ON INDEPENDENT WORKSTATION  TECHNIQUE: Multidetector CT imaging of the left knee was performed according to the standard protocol. Multiplanar CT image reconstructions were also generated. 3-dimensional CT images were rendered by post-processing of the original CT data on an independent workstation. The 3-dimensional CT images were interpreted and findings were reported in the accompanying complete CT report for this study  COMPARISON:  None.  FINDINGS: The bones are demineralized. There is a comminuted depressed fracture of the lateral tibial plateau posterolaterally. There is approximately 10 mm of depression of the articular surface posteriorly on the coronal images. There are small adjacent intra-articular fracture fragments. This fracture demonstrates no medial extension across the intercondylar eminences or involvement of the medial tibial plateau.  The distal femur, patella and proximal fibula are intact. There are moderate tricompartmental degenerative changes at the knee.  The knee is mildly hyperextended. Acute or chronic injury of the anterior cruciate ligament is possible. There is a moderate size knee joint effusion with irregular synovial thickening suspicious for hemarthrosis and possible synovitis. There is probable hemorrhage within the extensor musculature of the proximal lower leg. Scattered vascular calcifications noted.  3-dimensional CT images were rendered by post-processing of the original CT data at independent workstation. The 3-dimensional CT images were interpreted, and findings were reported in the accompanying complete CT report  for this study.  IMPRESSION: 1. Comminuted depressed intra-articular fracture of the posterior aspect of the lateral tibial plateau as described. 2. Associated hemarthrosis and probable synovitis. 3. Hyperextension of the knee suggesting possible ACL injury.   Electronically Signed   By: Richardean Sale M.D.   On: 06/12/2014 17:48   Ct 3d Independent Darreld Mclean  06/12/2014   CLINICAL DATA:  Lateral tibial plateau fracture. Severe arthritis. Preoperative evaluation.  EXAM: CT OF THE  LEFT KNEE WITHOUT CONTRAST; 3-DIMENSIONAL CT IMAGE RENDERING ON INDEPENDENT WORKSTATION  TECHNIQUE: Multidetector CT imaging of the left knee was performed according to the standard protocol. Multiplanar CT image reconstructions were also generated. 3-dimensional CT images were rendered by post-processing of the original CT data on an independent workstation. The 3-dimensional CT images were interpreted and findings were reported in the accompanying complete CT report for this study  COMPARISON:  None.  FINDINGS: The bones are demineralized. There is a comminuted depressed fracture of the lateral tibial plateau posterolaterally. There is approximately 10 mm of depression of the articular surface posteriorly on the coronal images. There are small adjacent intra-articular fracture fragments. This fracture demonstrates no medial extension across the intercondylar eminences or involvement of the medial tibial plateau.  The distal femur, patella and proximal fibula are intact. There are moderate tricompartmental degenerative changes at the knee.  The knee is mildly hyperextended. Acute or chronic injury of the anterior cruciate ligament is possible. There is a moderate size knee joint effusion with irregular synovial thickening suspicious for hemarthrosis and possible synovitis. There is probable hemorrhage within the extensor musculature of the proximal lower leg. Scattered vascular calcifications noted.  3-dimensional CT images were rendered by  post-processing of the original CT data at independent workstation. The 3-dimensional CT images were interpreted, and findings were reported in the accompanying complete CT report for this study.  IMPRESSION: 1. Comminuted depressed intra-articular fracture of the posterior aspect of the lateral tibial plateau as described. 2. Associated hemarthrosis and probable synovitis. 3. Hyperextension of the knee suggesting possible ACL injury.   Electronically Signed   By: Richardean Sale M.D.   On: 06/12/2014 17:48   Dg Shoulder Left  06/13/2014   CLINICAL DATA:  Fall in bathroom, left shoulder pain, arthritis  EXAM: LEFT SHOULDER - 2+ VIEW  COMPARISON:  None available  FINDINGS: Bones are osteopenic. There is an acute minimally displaced fracture of the left distal clavicle. Alignment of the [facility] joint remains maintained. No separation. Supraclavicular soft tissue swelling suspected. There appears to be a healed proximal left humerus surgical neck fracture with residual deformity. Glenohumeral joint aligned.  IMPRESSION: Acute minimally displaced left distal clavicle fracture.  Osteopenia  Healed proximal left humerus surgical neck fracture with deformity   Electronically Signed   By: Jerilynn Mages.  Shick M.D.   On: 06/13/2014 14:48     EKG Interpretation   Date/Time:  Saturday June 13 2014 13:55:33 EDT Ventricular Rate:  61 PR Interval:  188 QRS Duration: 78 QT Interval:  419 QTC Calculation: 422 R Axis:   52 Text Interpretation:  Sinus rhythm No significant change since last  tracing Confirmed by Vickki Igou  MD, Shaunika Italiano ([id number]) on 06/13/2014 3:31:13 PM      MDM   Final diagnoses:  Syncope and collapse  Hypokalemia  Clavicle fracture, left, closed, initial encounter    80 year old female with syncope/near syncope this morning. Suspect related to using bathroom. EKG w/o acute change. Denies preceding CP, SOB, palpitations, etc.  Left shoulder pain as result of subsequent fall. Imaging significant for a left lateral  clavicle fracture. Closed injury. Known recent tibial plateau fracture with upcoming surgery. Patient already using a walker because of this. Her mobility is going to be significantly impaired further secondary to her clavicle fracture. She lives at home with her husband and with new injury do not feel that she can manage now with clavicle fx. Discussed with Education officer, museum. Patient feel she needs short term SNF.  Virgel Manifold, MD 06/13/14 1539

## 2014-06-13 NOTE — ED Notes (Signed)
Pt to xray

## 2014-06-13 NOTE — ED Notes (Signed)
Bed: WA19 Expected date:  Expected time:  Means of arrival:  Comments: syncope 

## 2014-06-13 NOTE — ED Notes (Signed)
Report given to Kim, RN.

## 2014-06-13 NOTE — Progress Notes (Addendum)
4:10pm. CSW received consult from MD for SNF placement. Pt typically uses a walker. This morning she fainted and fractured her L lateral clavicle, and is now unable to use her walker due to pain. MD confirms that her mobility is going to be significantly impaired further secondary to her clavicle fracture. She lives at home with her husband and with new injury do not feel that she can manage now with clavicle fx.  CSW met with pt and pt's husband, Earma Nicolaou [phone]). They live together in a two story house with 8 steps to enter, and 16 steps between the floors inside. Pt has [employer] Medicare.   CSW provided directory of SNF placements and explained the placement process. Pt agreed to be faxed out to all [facility] facilities. She states her number 1 preference is [facility], since it is in [place] [place], though pt states she understands that [facility] must offer a bed and this might not be a possibility on weekend. Pt and husband thanked CSW for her assistance and stated they felt a little better having a plan to move forward.   CSW staffed case with MD. MD to place a PT order. MD states pt will likely be admitted to observation.   Rochele Pages,     ED CSW  phone: [phone]

## 2014-06-14 ENCOUNTER — Encounter (HOSPITAL_COMMUNITY): Payer: Self-pay | Admitting: *Deleted

## 2014-06-14 DIAGNOSIS — S42009A Fracture of unspecified part of unspecified clavicle, initial encounter for closed fracture: Secondary | ICD-10-CM

## 2014-06-14 LAB — CBC
HCT: 37.7 % (ref 36.0–46.0)
Hemoglobin: 12.2 g/dL (ref 12.0–15.0)
MCH: 30.1 pg (ref 26.0–34.0)
MCHC: 32.4 g/dL (ref 30.0–36.0)
MCV: 93.1 fL (ref 78.0–100.0)
Platelets: 199 10*3/uL (ref 150–400)
RBC: 4.05 MIL/uL (ref 3.87–5.11)
RDW: 14.7 % (ref 11.5–15.5)
WBC: 6.1 10*3/uL (ref 4.0–10.5)

## 2014-06-14 LAB — BASIC METABOLIC PANEL
Anion gap: 6 (ref 5–15)
BUN: 26 mg/dL — ABNORMAL HIGH (ref 6–23)
CALCIUM: 7.8 mg/dL — AB (ref 8.4–10.5)
CO2: 27 mmol/L (ref 19–32)
Chloride: 107 mmol/L (ref 96–112)
Creatinine, Ser: 0.82 mg/dL (ref 0.50–1.10)
GFR calc Af Amer: 78 mL/min — ABNORMAL LOW (ref >90)
GFR calc non Af Amer: 67 mL/min — ABNORMAL LOW (ref >90)
GLUCOSE: 91 mg/dL (ref 70–99)
POTASSIUM: 4 mmol/L (ref 3.5–5.1)
Sodium: 140 mmol/L (ref 135–145)

## 2014-06-14 MED ORDER — NICOTINE 21 MG/24HR TD PT24
21.0000 mg | MEDICATED_PATCH | Freq: Every day | TRANSDERMAL | Status: DC
  Administered 2014-06-14 – 2014-06-17 (×2): 21 mg via TRANSDERMAL
  Filled 2014-06-14 (×4): qty 1

## 2014-06-14 MED ORDER — ENSURE ENLIVE PO LIQD
237.0000 mL | Freq: Two times a day (BID) | ORAL | Status: DC
  Administered 2014-06-14 – 2014-06-15 (×2): 237 mL via ORAL

## 2014-06-14 NOTE — Clinical Social Work Psychosocial (Signed)
Clinical Social Work Department BRIEF PSYCHOSOCIAL ASSESSMENT 06/14/2014  Patient:  [name], [name]     Account Number:  [id number]     Admit date:  06/13/2014  Clinical Social Worker:  Dede Query, CLINICAL SOCIAL WORKER  Date/Time:  06/14/2014 11:37 AM  Referred by:  Physician  Date Referred:  06/14/2014 Referred for  SNF Placement   Other Referral:   Interview type:  Patient Other interview type:    PSYCHOSOCIAL DATA Living Status:  HUSBAND Admitted from facility:   Level of care:   Primary support name:  Barnabas Lister Primary support relationship to patient:  SPOUSE Degree of support available:   High/ pt's spouse is available and supportive.    CURRENT CONCERNS  Other Concerns:    SOCIAL WORK ASSESSMENT / PLAN CSW met with pt at bedside to discuss needed services.  CSW explained role of CSW and prompted pt to discuss her history.  CSW prompted pt to explore her thoughts and feelings related to going to rehab. CSW discussed  SNF options and explained the process.  CSW provided active and supportive listening regarding pt's diagnoses.   Assessment/plan status:  Psychosocial Support/Ongoing Assessment of Needs Other assessment/ plan:   Send pt information to SNF   Information/referral to community resources:    PATIENT'S/FAMILY'S RESPONSE TO PLAN OF CARE: Pt discussed living at home with her 21 year old husband who can still drive.  Pt discussed having RA and using a walker to get around due to knee issues.  Pt discussed feeling dizzy yesterday and falling off toilet bowl, hitting her head, briefly loosing consciousness and breaking her clavicle.   Pt explained that she was supposed to have surgery on her knee in a couple of weeks and was unsure how this would happen now.  Pt discussed unhappiness at "being totally immobile".  Pt stated that she has never done rehab but she does not have any family in state and only her 67 year old husband at home so she would like to go to [facility]  for her rehab needs.  Pt hopeful to get a bed at [facility] because her husband does not see too well to drive too far and this SNF is a block away from her home.   Dede Query, LCSW [facility] Worker - Weekend Coverage cell #: [phone]

## 2014-06-14 NOTE — Progress Notes (Signed)
Patient ID: [name], female   DOB: 12-Apr-1934, 80 y.o.   MRN: [id number]  [facility] PROGRESS NOTE  [name]:[id number] DOB: Jun 15, 1934 DOA: 06/13/2014 PCP: [name] Kinnier, DO   Brief narrative:    Pt is 80 yo female who recently suffered left tibial fracture, sees Dr. Alvan Dame, presented to [facility] ED after an episode of lightheadedness that occurred earlier in the day prior to this admission. Pt explains she went to there bathroom and was sitting on the toilet and when she stood up she felt lightheaded and fell down hitting her left shoulder. She is now concerned with persistent left shoulder pain that is throbbing and sharp, 5/10 in severity, non radiating, no specific alleviating factors, worse with movement. She denies chest pain or shortness of breath, no specific abdominal or urinary concerns, no similar events in the past. Pt denies any numbness or tingling, no focal neurological symptoms.   In ED, pt noted to be hemodynamically stable, VSS, imaging studies notable for clavicular fracture. SW met with pt and able to find placement for AM. [facility] asked to admit for observation overnight and pain control.  Assessment/Plan:    Active Problems: Syncopal event - unclear what is the precipitation factor  orthostatic vitals to be checked today  - blood work unremarkable - PT evaluation pending  Acute left clavicular fracture - OT evaluation pending  - continue to provide analgesia as needed  Left tibial fracture - has been seen Dr. Alvan Dame who was planning on operating this Tuesday, may need to keep pt until surgery down Hypokalemia - supplemented WNL this AM Hypothyroidism - continue synthroid HTN, accelerated  - continue HCTZ RA - on prednisone and humira, continue here  DVT prophylaxis - Lovenox SQ  Code Status: Full.  Family Communication:  plan of care discussed with the patient Disposition Plan: SNF  IV access:  Peripheral IV  Procedures and diagnostic  studies:     Ct Knee Left Wo Contrast 06/12/2014 Comminuted depressed intra-articular fracture of the posterior aspect of the lateral tibial plateau as described. 2. Associated hemarthrosis and probable synovitis. 3. Hyperextension of the knee suggesting possible ACL injury.   Ct 3d Independent Wkst 06/12/2014 Comminuted depressed intra-articular fracture of the posterior aspect of the lateral tibial plateau as described. 2. Associated hemarthrosis and probable synovitis. 3. Hyperextension of the knee suggesting possible ACL injury.   Dg Shoulder Left 06/13/2014 Acute minimally displaced left distal clavicle fracture. Osteopenia Healed proximal left humerus surgical neck fracture with deformity   Medical Consultants:  None  Other Consultants:  PT/OT  IAnti-Infectives:   None  [name] Ramsay, MD  [facility] Pager [phone]  If 7PM-7AM, please contact night-coverage [email or web address] Password [facility] 06/14/2014, 3:27 PM   LOS: 1 day   HPI/Subjective: No events overnight.   Objective: Filed Vitals:   06/13/14 1856 06/13/14 2200 06/14/14 0600 06/14/14 1519  BP: 140/66 132/58 139/56 138/67  Pulse: 72 74 62 77  Temp:  98.7 F (37.1 C) 99.3 F (37.4 C) 98.2 F (36.8 C)  TempSrc:  Oral Oral Oral  Resp: _0 Height: _1  (1.702 m)     Weight: 60.601 kg (133 lb 9.6 oz)     SpO2: 98% 97% 99% 93%    Intake/Output Summary (Last 24 hours) at 06/14/14 1527 Last data filed at 06/14/14 1520  Gross per 24 hour  Intake   2200 ml  Output    525 ml  Net   1675 ml  Exam:   General:  Pt is alert, follows commands appropriately, not in acute distress  Cardiovascular: Regular rate and rhythm, no rubs, no gallops  Respiratory: Clear to auscultation bilaterally, no wheezing, no crackles, no rhonchi  Abdomen: Soft, non tender, non distended, bowel sounds present, no guarding  Extremities: No edema, pulses DP and PT palpable bilaterally  Data Reviewed: Basic Metabolic  Panel:  Recent Labs Lab 06/13/14 1407 06/14/14 0425  NA 141 140  K 3.1* 4.0  CL 100 107  CO2 29 27  GLUCOSE 102* 91  BUN 21 26*  CREATININE 0.74 0.82  CALCIUM 8.5 7.8*   CBC:  Recent Labs Lab 06/13/14 1407 06/14/14 0425  WBC 9.2 6.1  HGB 14.8 12.2  HCT 44.8 37.7  MCV 92.4 93.1  PLT 218 199   CBG:  Recent Labs Lab 06/13/14 1412  GLUCAP 95    Scheduled Meds: . aspirin EC  81 mg Oral QODAY  . celecoxib  200 mg Oral BID  . enoxaparin (LOVENOX) injection  40 mg Subcutaneous Q24H  . feeding supplement (ENSURE ENLIVE)  237 mL Oral BID BM  . folic acid  2 mg Oral Daily  . hydrochlorothiazide  25 mg Oral Q breakfast  . latanoprost  1 drop Both Eyes QHS  . leflunomide  20 mg Oral Daily  . levothyroxine  88 mcg Oral QAC breakfast  . medroxyPROGESTERone  2.5 mg Oral QHS  . multivitamin with minerals  1 tablet Oral Daily  . nicotine  21 mg Transdermal Daily  . predniSONE  5 mg Oral Q breakfast  . saccharomyces boulardii  250 mg Oral BID   Continuous Infusions:

## 2014-06-14 NOTE — Progress Notes (Signed)
Please see Physician sticky note requesting CIWA and Nicotene patch order request.Pt is daily smoker and alcohol consumption,showing early signs of withdrawal,restless,agitation,irritable,diaphoretic.thank you Daylene Posey

## 2014-06-14 NOTE — Clinical Social Work Placement (Signed)
Clinical Social Work Department CLINICAL SOCIAL WORK PLACEMENT NOTE 06/14/2014  Patient:  [name], [name]  Account Number:  [id number] Admit date:  06/13/2014  Clinical Social Worker:  Dede Query, CLINICAL SOCIAL WORKER  Date/time:  06/14/2014 11:57 AM  Clinical Social Work is seeking post-discharge placement for this patient at the following level of care:   [facility]   (*CSW will update this form in Epic as items are completed)   06/14/2014  Patient/family provided with [facility] Department of Clinical Social Work's list of facilities offering this level of care within the geographic area requested by the patient (or if unable, by the patient's family).  06/14/2014  Patient/family informed of their freedom to choose among providers that offer the needed level of care, that participate in Medicare, Medicaid or managed care program needed by the patient, have an available bed and are willing to accept the patient.  06/14/2014  Patient/family informed of [facility]' ownership interest in [facility], as well as of the fact that they are under no obligation to receive care at this facility.  PASARR submitted to EDS on 06/14/2014 PASARR number received on 06/14/2014  FL2 transmitted to all facilities in geographic area requested by pt/family on  06/14/2014 FL2 transmitted to all facilities within larger geographic area on   Patient informed that his/her managed care company has contracts with or will negotiate with  certain facilities, including the following:     Patient/family informed of bed offers received:   Patient chooses bed at [facility] [place] Physician recommends and patient chooses bed at    Patient to be transferred to  on   Patient to be transferred to facility by  Patient and family notified of transfer on  Name of family member notified:    The following physician request were entered in Epic:   Additional  Comments:  .Dede Query, [place] Social Worker - Weekend Coverage cell #: [phone]

## 2014-06-14 NOTE — Progress Notes (Signed)
OT Cancellation Note  Patient Details Name: [name] MRN: [id number] DOB: 27-Jul-1934   Cancelled Treatment:    Reason Eval/Treat Not Completed: Other (comment) OT eval deferred following discussion with RN. Pt for surgery with Dr Alvan Dame tomorrow. Will follow.   Betsy Pries 06/14/2014, 12:13 PM

## 2014-06-14 NOTE — Progress Notes (Signed)
CARE MANAGEMENT NOTE 06/14/2014  Patient:  [name], [name]   Account Number:  [id number]  Date Initiated:  06/14/2014  Documentation initiated by:  [facility]  Subjective/Objective Assessment:   Acute left clavicular fracture     Action/Plan:   Anticipated DC Date:  06/15/2014   Anticipated DC Plan:  [place]  CM consult      Choice offered to / List presented to:             Status of service:  Completed, signed off Medicare Important Message given?   (If response is "NO", the following Medicare IM given date fields will be blank) Date Medicare IM given:   Medicare IM given by:   Date Additional Medicare IM given:   Additional Medicare IM given by:    Discharge Disposition:  [place]  Per UR Regulation:    If discussed at Long Length of Stay Meetings, dates discussed:    Comments:  06/14/2014 1240 Chart reviewed. Planned dc to SNF-rehab. CSW following for SNF. Jonnie Finner RN CCM Case Mgmt phone [phone]

## 2014-06-14 NOTE — Progress Notes (Signed)
PT Cancellation Note  Patient Details Name: [name] MRN: [id number] DOB: 11/27/1934   Cancelled Treatment:     PT eval deferred following discussion with RN.  Pt for surgery with Dr Alvan Dame tomorrow.  Will follow.   Mariell Nester 06/14/2014, 11:55 AM

## 2014-06-14 NOTE — Progress Notes (Addendum)
Pt would like Florastor d/cd states no longer taking. Home med [place] taken to pharmacy by [employer] RN Copy in chart. Aggie Moats D

## 2014-06-15 NOTE — Progress Notes (Signed)
PT Cancellation Note  Patient Details Name: AUDRIELLA BLAKELEY MRN: [id number] DOB: 04-16-1934   Cancelled Treatment:    Reason Eval/Treat Not Completed: Medical issues which prohibited therapy (awaiting surgery for L tibial fx)   Rusti Arizmendi,KATHrine E 06/15/2014, 8:23 AM

## 2014-06-15 NOTE — Evaluation (Signed)
Physical Therapy Evaluation Patient Details Name: [name] MRN: [id number] DOB: 1934-11-05 Today's Date: 06/15/2014   History of Present Illness  80 yo female who recently suffered left tibial fracture currently in [place], sees Dr. Alvan Dame, presented to [facility] ED after an episode of lightheadedness at home with fall hitting L shoulder (per imaging acute minimally displaced left distal clavicle fracture).  Dr. Alvan Dame asked PT to evaluate pt's mobility and pain at current state prior to surgery.  Clinical Impression  Pt admitted with above diagnosis. Pt currently with functional limitations due to the deficits listed below (see PT Problem List).  Pt will benefit from skilled PT to increase their independence and safety with mobility to allow discharge to the venue listed below.  Pt evaluated to assess mobility and pain prior to surgery.  Pt tolerated short distance ambulation well with less pain in L shoulder with second attempt (see below).  Pt plans to d/c to SNF due to multiple stairs at home and states she lives with spouse but he would not be able to provide physical assist.  Will continue to assist with mobility, strength, endurance during acute stay, pt with pending surgery as well.     Follow Up Recommendations SNF    Equipment Recommendations  None recommended by PT    Recommendations for Other Services       Precautions / Restrictions Precautions Precautions: Fall;Knee Required Braces or Orthoses: Knee Immobilizer - Left Knee Immobilizer - Left: On at all times (per pt) Restrictions Other Position/Activity Restrictions: WBAT L LE and L UE at this time prior to surgery verbally per Dr. Alvan Dame      Mobility  Bed Mobility Overal bed mobility: Needs Assistance Bed Mobility: Sit to Supine       Sit to supine: Min assist;HOB elevated   General bed mobility comments: assist for L LE support, exited bed on pt's Right side  Transfers Overall transfer level: Needs assistance Equipment  used: Rolling walker (2 wheeled) Transfers: Sit to/from Stand Sit to Stand: Mod assist;Min assist;+2 safety/equipment;+2 physical assistance         General transfer comment: verbal cues for UE and LE positioning, assist to rise required initially howerver performed twice and pt able to improve second time however still required cues  Ambulation/Gait Ambulation/Gait assistance: Min assist;+2 safety/equipment Ambulation Distance (Feet): 34 Feet (total) Assistive device: Rolling walker (2 wheeled) Gait Pattern/deviations: Step-to pattern;Antalgic Gait velocity: decr   General Gait Details: 10' x1, 24'x1, verbal cues for safe use of RW, step length, RW positioning, increased L toe out observed, pt reports minimal pain in L knee, 4-5/10 pain in L shoulder first time ambulating then reported less pain with second ambulation  Stairs            Wheelchair Mobility    Modified Rankin (Stroke Patients Only)       Balance Overall balance assessment: History of Falls                                           Pertinent Vitals/Pain Pain Assessment: 0-10 Pain Score: 5  Pain Location: L shoulder Pain Descriptors / Indicators: Aching Pain Intervention(s): Limited activity within patient's tolerance;Monitored during session;Repositioned    Home Living Family/patient expects to be discharged to:: Skilled nursing facility Living Arrangements: Spouse/significant other   Type of Home: House Home Access: Stairs to enter  Home Layout: Multi-level;Bed/bath upstairs Home Equipment: Walker - 2 wheels;Toilet riser;Shower seat      Prior Function Level of Independence: Independent with assistive device(s)         Comments: independent prior to L tibial plateau fx - using RW and KI since     Hand Dominance        Extremity/Trunk Assessment   Upper Extremity Assessment: LUE deficits/detail       LUE Deficits / Details: limited pt's active ROM during  mobility due to clavicle fx, ecchymosis anterior lateral shoulder   Lower Extremity Assessment: LLE deficits/detail   LLE Deficits / Details: KI not in good position, reapplied in appropriate position then maintained KI, increased pes planus and toe out L foot (prior back surgery per pt), able to perform ankle pumps     Communication   Communication: No difficulties  Cognition Arousal/Alertness: Awake/alert Behavior During Therapy: WFL for tasks assessed/performed Overall Cognitive Status: Within Functional Limits for tasks assessed                      General Comments      Exercises        Assessment/Plan    PT Assessment Patient needs continued PT services  PT Diagnosis Acute pain;Abnormality of gait   PT Problem List Decreased strength;Decreased activity tolerance;Decreased mobility;Decreased balance;Pain;Decreased knowledge of use of DME  PT Treatment Interventions DME instruction;Gait training;Functional mobility training;Patient/family education;Therapeutic activities;Therapeutic exercise   PT Goals (Current goals can be found in the Care Plan section) Acute Rehab PT Goals PT Goal Formulation: With patient Time For Goal Achievement: 06/20/14 Potential to Achieve Goals: Good    Frequency 7X/week   Barriers to discharge        Co-evaluation               End of Session Equipment Utilized During Treatment: Gait belt;Left knee immobilizer Activity Tolerance: Patient tolerated treatment well Patient left: in bed;with call bell/phone within reach           Time: 1046-1107 PT Time Calculation (min) (ACUTE ONLY): 21 min   Charges:   PT Evaluation $Initial PT Evaluation Tier I: 1 Procedure     PT G Codes:        Coty Student,KATHrine E 06/15/2014, 11:34 AM Carmelia Bake, PT, DPT 06/15/2014 Pager: [phone]

## 2014-06-15 NOTE — Progress Notes (Signed)
Nutrition Brief Note  Patient identified on the Malnutrition Screening Tool (MST) Report  Wt Readings from Last 15 Encounters:  06/13/14 133 lb 9.6 oz (60.601 kg)  03/26/14 136 lb 12.8 oz (62.052 kg)  03/10/14 143 lb (64.864 kg)  10/06/13 126 lb (57.153 kg)  09/08/13 125 lb (56.7 kg)  08/27/13 128 lb (58.06 kg)  08/06/13 139 lb 3.2 oz (63.141 kg)  07/30/13 134 lb 3.2 oz (60.873 kg)  07/16/13 138 lb (62.596 kg)  05/21/13 139 lb (63.05 kg)  03/10/13 142 lb 3.2 oz (64.501 kg)  08/01/12 138 lb 12.8 oz (62.959 kg)  11/24/11 136 lb 3.9 oz (61.8 kg)  10/31/11 139 lb 8 oz (63.277 kg)    Body mass index is 20.92 kg/(m^2). Patient meets criteria for normal body weight based on current BMI.   Current diet order is regular, patient is consuming approximately 100% of meals at this time. Labs and medications reviewed.   Pt reports that her appetite was poor for a few days prior to admission. She says she has had a very large appetite since admission. Pt eatign 100%. Denied need for nutritional supplements at this time. Will d/c Ensure Enlive.   No nutrition interventions warranted at this time. If nutrition issues arise, please consult RD.   Laurette Schimke Cornish, [place], [place]

## 2014-06-15 NOTE — Progress Notes (Signed)
OT Cancellation Note  Patient Details Name: [name] MRN: [id number] DOB: Jun 11, 1934   Cancelled Treatment:    Reason Eval/Treat Not Completed: Medical issues which prohibited therapy  Medical issues which prohibited therapy (awaiting surgery for L tibial fx)   Betsy Pries 06/15/2014, 8:41 AM

## 2014-06-15 NOTE — Progress Notes (Signed)
Patient ID: AREANA KOSANKE, female   DOB: 1935-01-09, 80 y.o.   MRN: [id number]  [facility] PROGRESS NOTE  MADISAN BICE QZE:[id number] DOB: May 10, 1934 DOA: 06/13/2014 PCP: Hollace Kinnier, DO   Brief narrative:    Pt is 80 yo female who recently suffered left tibial fracture, sees Dr. Alvan Dame, presented to [facility] ED after an episode of lightheadedness that occurred earlier in the day prior to this admission. Pt explains she went to there bathroom and was sitting on the toilet and when she stood up she felt lightheaded and fell down hitting her left shoulder. She is now concerned with persistent left shoulder pain that is throbbing and sharp, 5/10 in severity, non radiating, no specific alleviating factors, worse with movement. She denies chest pain or shortness of breath, no specific abdominal or urinary concerns, no similar events in the past. Pt denies any numbness or tingling, no focal neurological symptoms.   In ED, pt noted to be hemodynamically stable, VSS, imaging studies notable for clavicular fracture. SW met with pt and able to find placement for AM. [facility] asked to admit for observation overnight and pain control.  Assessment/Plan:    Active Problems: Syncopal event - unclear what is the precipitation factor - stable orthostatic  - blood work unremarkable - PT evaluation done, SNF recommended  Acute left clavicular fracture - OT evaluation pending  - continue to provide analgesia as needed  Left tibial fracture - has been seen by Dr. Alvan Dame who was planning on operating this Tuesday, may need to keep pt until surgery down Hypokalemia - supplemented WNL this AM Hypothyroidism - continue synthroid HTN, accelerated  - continue HCTZ RA - on prednisone and humira, continue here  DVT prophylaxis - Lovenox SQ  Code Status: Full.  Family Communication:  plan of care discussed with the patient Disposition Plan: SNF if no surgery planned while inpatient   IV access:   Peripheral IV  Procedures and diagnostic studies:     Ct Knee Left Wo Contrast 06/12/2014 Comminuted depressed intra-articular fracture of the posterior aspect of the lateral tibial plateau as described. 2. Associated hemarthrosis and probable synovitis. 3. Hyperextension of the knee suggesting possible ACL injury.   Ct 3d Independent Wkst 06/12/2014 Comminuted depressed intra-articular fracture of the posterior aspect of the lateral tibial plateau as described. 2. Associated hemarthrosis and probable synovitis. 3. Hyperextension of the knee suggesting possible ACL injury.   Dg Shoulder Left 06/13/2014 Acute minimally displaced left distal clavicle fracture. Osteopenia Healed proximal left humerus surgical neck fracture with deformity   Medical Consultants:  None  Other Consultants:  PT/OT  IAnti-Infectives:   None  Faye Ramsay, MD  [facility] Pager [phone]  If 7PM-7AM, please contact night-coverage [email or web address] Password [email or web address] 06/15/2014, 12:07 PM   LOS: 2 days   HPI/Subjective: No events overnight.   Objective: Filed Vitals:   06/14/14 1910 06/14/14 1920 06/14/14 2040 06/15/14 0501  BP: 149/79 171/97 136/65 141/57  Pulse: 83 92 68 56  Temp:   98.9 F (37.2 C) 98.3 F (36.8 C)  TempSrc:   Oral Oral  Resp:   20 16  Height:      Weight:      SpO2: 96% 97% 97% 96%    Intake/Output Summary (Last 24 hours) at 06/15/14 1207 Last data filed at 06/15/14 1030  Gross per 24 hour  Intake   1200 ml  Output   1200 ml  Net      0 ml    Exam:  General:  Pt is alert, follows commands appropriately, not in acute distress  Cardiovascular: Regular rate and rhythm, no rubs, no gallops  Respiratory: Clear to auscultation bilaterally, no wheezing, no crackles, no rhonchi  Abdomen: Soft, non tender, non distended, bowel sounds present, no guarding  Extremities: No edema, pulses DP and PT palpable bilaterally  Data Reviewed: Basic Metabolic Panel:  Recent  Labs Lab 06/13/14 1407 06/14/14 0425  NA 141 140  K 3.1* 4.0  CL 100 107  CO2 29 27  GLUCOSE 102* 91  BUN 21 26*  CREATININE 0.74 0.82  CALCIUM 8.5 7.8*   CBC:  Recent Labs Lab 06/13/14 1407 06/14/14 0425  WBC 9.2 6.1  HGB 14.8 12.2  HCT 44.8 37.7  MCV 92.4 93.1  PLT 218 199   CBG:  Recent Labs Lab 06/13/14 1412  GLUCAP 95    Scheduled Meds: . aspirin EC  81 mg Oral QODAY  . celecoxib  200 mg Oral BID  . enoxaparin (LOVENOX) injection  40 mg Subcutaneous Q24H  . feeding supplement (ENSURE ENLIVE)  237 mL Oral BID BM  . folic acid  2 mg Oral Daily  . hydrochlorothiazide  25 mg Oral Q breakfast  . latanoprost  1 drop Both Eyes QHS  . leflunomide  20 mg Oral Daily  . levothyroxine  88 mcg Oral QAC breakfast  . medroxyPROGESTERone  2.5 mg Oral QHS  . multivitamin with minerals  1 tablet Oral Daily  . nicotine  21 mg Transdermal Daily  . predniSONE  5 mg Oral Q breakfast  . saccharomyces boulardii  250 mg Oral BID   Continuous Infusions:

## 2014-06-15 NOTE — Consult Note (Signed)
Reason for Consult: Left knee and left clavicle Referring Physician:  Doyle Askew, MD  [name] is an 80 y.o. female.  HPI: 80yo female known to me due to her advanced degenerative joint disease related to underlying Rheumatoid arthritis.  She was recently seen in our office due to increasing left knee pain with activity.  She was noted to have progressively worsening left knee pain but also radiographically noted to have posterior tibial plateau fracture involving less than 10 %.  She had been seen my one of my Physician Assistants last week while I was on vacation but we had reviewed her studies and made plans to try and have her added on to the OR schedule sooner than later.   She unfortunately was noted to have this syncopal episode that she describe and fell onto her left shoulder ( previous proximal humerus fracture) and left forehead.  She was admitted due to pain and limited function involving now both her left knee and left shoulder. No other reports of injuries involving her right lower extremity  Past Medical History  Diagnosis Date  . Ulcerative colitis   . Hypothyroidism   . Hypertension     pcp   dr reed   [facility]  . Pneumonia 2008  . Chronic bronchitis 10/31/2011    "prone to it; I have sinus/bronchitis problem probably q yr"  . Multiple skin tears 10/31/2011    "get them very easily; my skin is thin and I bruise easily"  . Chronic back pain     "my entire spine"  . Edema   . Other and unspecified hyperlipidemia   . Encounter for long-term (current) use of other medications   . Senile osteoporosis   . Spinal stenosis, unspecified region other than cervical   . Unspecified glaucoma   . Hypothyroidism   . Complication of anesthesia 2009    htn with block- for hand surgery   . Anxiety     sometimes   . Rheumatoid arthritis(714.0)     degenerative disc disease     Past Surgical History  Procedure Laterality Date  . Back surgery    . Sinus surgery with  instatrak    . Thyroidectomy, partial  1997  . Posterior fusion lumbar spine  10/31/2011    L2-3; 3-4  . Tonsillectomy      "when I was a small child"  . Functional endoscopic sinus surgery  1988  . Posterior fusion lumbar spine  11/2009    L3-4;  L4-5  . Joint replacement  2009; 2006    joints 2 fingers,rt hand; joint left thumb  . Spine surgery    . Posterior lumbar fusion N/A 08/05/13    T1, T2  . Eye surgery    . Anterior cervical decomp/discectomy fusion N/A 02/17/2014    Procedure: CERVICAL FOUR TO FIVE ANTERIOR CERVICAL DECOMPRESSION/DISCECTOMY FUSION 1 LEVEL;  Surgeon: [name], MD;  Location: [facility];  Service: Neurosurgery;  Laterality: N/A;  C4-5 Anterior cervical decompression/diskectomy/fusion  . Cataract extraction Left 11/11/2013  . Cataract extraction Right 12/30/2013    Family History  Problem Relation Age of Onset  . Heart disease Mother   . Cancer Father     lung    Social History:  reports that she has been passively smoking Cigarettes.  She has a 12.5 pack-year smoking history. She uses smokeless tobacco. She reports that she drinks about 2.4 oz of alcohol per week. She reports that she does not use illicit  drugs.  Allergies: No Known Allergies  Medications:  I have reviewed the patient's current medications. Scheduled: . aspirin EC  81 mg Oral QODAY  . celecoxib  200 mg Oral BID  . enoxaparin (LOVENOX) injection  40 mg Subcutaneous Q24H  . feeding supplement (ENSURE ENLIVE)  237 mL Oral BID BM  . folic acid  2 mg Oral Daily  . hydrochlorothiazide  25 mg Oral Q breakfast  . latanoprost  1 drop Both Eyes QHS  . leflunomide  20 mg Oral Daily  . levothyroxine  88 mcg Oral QAC breakfast  . medroxyPROGESTERone  2.5 mg Oral QHS  . multivitamin with minerals  1 tablet Oral Daily  . nicotine  21 mg Transdermal Daily  . predniSONE  5 mg Oral Q breakfast  . saccharomyces boulardii  250 mg Oral BID    No results found for this or any previous visit  (from the past 24 hour(s)).   X-ray: CLINICAL DATA: Lateral tibial plateau fracture. Severe arthritis. Preoperative evaluation.  EXAM: CT OF THE LEFT KNEE WITHOUT CONTRAST; 3-DIMENSIONAL CT IMAGE RENDERING ON INDEPENDENT WORKSTATION  TECHNIQUE: Multidetector CT imaging of the left knee was performed according to the standard protocol. Multiplanar CT image reconstructions were also generated. 3-dimensional CT images were rendered by post-processing of the original CT data on an independent workstation. The 3-dimensional CT images were interpreted and findings were reported in the accompanying complete CT report for this study  COMPARISON: None.  FINDINGS: The bones are demineralized. There is a comminuted depressed fracture of the lateral tibial plateau posterolaterally. There is approximately 10 mm of depression of the articular surface posteriorly on the coronal images. There are small adjacent intra-articular fracture fragments. This fracture demonstrates no medial extension across the intercondylar eminences or involvement of the medial tibial plateau.  The distal femur, patella and proximal fibula are intact. There are moderate tricompartmental degenerative changes at the knee.  The knee is mildly hyperextended. Acute or chronic injury of the anterior cruciate ligament is possible. There is a moderate size knee joint effusion with irregular synovial thickening suspicious for hemarthrosis and possible synovitis. There is probable hemorrhage within the extensor musculature of the proximal lower leg. Scattered vascular calcifications noted.  3-dimensional CT images were rendered by post-processing of the original CT data at independent workstation. The 3-dimensional CT images were interpreted, and findings were reported in the accompanying complete CT report for this study.  IMPRESSION: 1. Comminuted depressed intra-articular fracture of the posterior aspect  of the lateral tibial plateau as described. 2. Associated hemarthrosis and probable synovitis. 3. Hyperextension of the knee suggesting possible ACL injury.   Electronically Signed  By: Richardean Sale M.D.  CLINICAL DATA: Fall in bathroom, left shoulder pain, arthritis  EXAM: LEFT SHOULDER - 2+ VIEW  COMPARISON: None available  FINDINGS: Bones are osteopenic. There is an acute minimally displaced fracture of the left distal clavicle. Alignment of the [facility] joint remains maintained. No separation. Supraclavicular soft tissue swelling suspected. There appears to be a healed proximal left humerus surgical neck fracture with residual deformity. Glenohumeral joint aligned.  IMPRESSION: Acute minimally displaced left distal clavicle fracture.  Osteopenia  Healed proximal left humerus surgical neck fracture with deformity   Electronically Signed  By: Jerilynn Mages. Shick M.D.  Review of Systems  Constitutional: Negative for fever, chills and weight loss.  HENT: Negative for congestion and nosebleeds.   Eyes: Negative for blurred vision and double vision.  Respiratory: Negative for cough, sputum production, shortness of breath and stridor.  Cardiovascular: Negative for chest pain and leg swelling.  Gastrointestinal: Negative for diarrhea and constipation.  Genitourinary: Negative for dysuria and urgency.  Musculoskeletal: Positive for joint pain.       Recent onset of progressively worsening left knee pain  Skin: Negative for itching and rash.    Blood pressure 141/57, pulse 56, temperature 98.3 F (36.8 C), temperature source Oral, resp. rate 16, height 5\' 7"  (1.702 m), weight 60.601 kg (133 lb 9.6 oz), SpO2 96 %.  Physical Exam   Awake alert oriented Pleasant lady  Left knee in immobilizer per our office visit Left arm free, no sling Tender to palpation left proximal shoulder girdle, left clavicle  Significant valgus left lower extremity [place] valgus  feet NVI  Assessment/Plan: Left knee advanced degenerative joint disease related to her underlying RA Discussed with her that if she was able to tolerate weight bearing through her left upper extremity enough to get through initial physical therapy with a walker then I would try and address her left knee tomorrow as an add on with the plans to perform a left total knee replacement  If unable to tolerate weight bearing then we will have to hold off on surgery until she is able to bear more weight.  This scenario will present as a challenge due to her 71 yo husband and would most likely require SNF placement prior to getting her knee replaced Therapy and personal input to walking with a walker pending  Marselino Slayton D 06/15/2014, 1:23 PM

## 2014-06-16 ENCOUNTER — Encounter (HOSPITAL_COMMUNITY): Payer: Self-pay | Admitting: Anesthesiology

## 2014-06-16 ENCOUNTER — Observation Stay (HOSPITAL_COMMUNITY): Payer: Medicare Other | Admitting: Anesthesiology

## 2014-06-16 ENCOUNTER — Encounter (HOSPITAL_COMMUNITY)
Admission: EM | Disposition: A | Discharge status: Skilled Nursing Facility | Payer: Self-pay | Source: Home / Self Care | Attending: Internal Medicine

## 2014-06-16 DIAGNOSIS — Z96652 Presence of left artificial knee joint: Secondary | ICD-10-CM

## 2014-06-16 HISTORY — PX: TOTAL KNEE ARTHROPLASTY: SHX125

## 2014-06-16 LAB — TYPE AND SCREEN
ABO/RH(D): B POS
ANTIBODY SCREEN: NEGATIVE

## 2014-06-16 LAB — ABO/RH: ABO/RH(D): B POS

## 2014-06-16 SURGERY — ARTHROPLASTY, KNEE, TOTAL
Anesthesia: General | Laterality: Left

## 2014-06-16 MED ORDER — ALBUTEROL SULFATE (2.5 MG/3ML) 0.083% IN NEBU
INHALATION_SOLUTION | RESPIRATORY_TRACT | Status: AC
  Administered 2014-06-16: 2.5 mg
  Filled 2014-06-16: qty 3

## 2014-06-16 MED ORDER — HYDROCORTISONE NA SUCCINATE PF 100 MG IJ SOLR
INTRAMUSCULAR | Status: AC
  Filled 2014-06-16: qty 2

## 2014-06-16 MED ORDER — MIDAZOLAM HCL 5 MG/5ML IJ SOLN
INTRAMUSCULAR | Status: DC | PRN
  Administered 2014-06-16: 1 mg via INTRAVENOUS

## 2014-06-16 MED ORDER — HYDROMORPHONE HCL 1 MG/ML IJ SOLN
INTRAMUSCULAR | Status: AC
  Administered 2014-06-16: 19:00:00
  Filled 2014-06-16: qty 1

## 2014-06-16 MED ORDER — MENTHOL 3 MG MT LOZG
1.0000 | LOZENGE | OROMUCOSAL | Status: DC | PRN
  Filled 2014-06-16: qty 9

## 2014-06-16 MED ORDER — IPRATROPIUM-ALBUTEROL 0.5-2.5 (3) MG/3ML IN SOLN
3.0000 mL | Freq: Once | RESPIRATORY_TRACT | Status: DC
  Administered 2014-06-16: 3 mL via RESPIRATORY_TRACT
  Filled 2014-06-16: qty 3

## 2014-06-16 MED ORDER — ONDANSETRON HCL 4 MG/2ML IJ SOLN
INTRAMUSCULAR | Status: DC | PRN
  Administered 2014-06-16: 4 mg via INTRAVENOUS

## 2014-06-16 MED ORDER — FENTANYL CITRATE 0.05 MG/ML IJ SOLN
INTRAMUSCULAR | Status: AC
  Filled 2014-06-16: qty 5

## 2014-06-16 MED ORDER — SODIUM CHLORIDE 0.9 % IJ SOLN
INTRAMUSCULAR | Status: DC | PRN
  Administered 2014-06-16: 30 mL

## 2014-06-16 MED ORDER — FERROUS SULFATE 325 (65 FE) MG PO TABS
325.0000 mg | ORAL_TABLET | Freq: Three times a day (TID) | ORAL | Status: DC
  Administered 2014-06-17 (×2): 325 mg via ORAL
  Filled 2014-06-16 (×4): qty 1

## 2014-06-16 MED ORDER — PHENOL 1.4 % MT LIQD
1.0000 | OROMUCOSAL | Status: DC | PRN
  Filled 2014-06-16: qty 177

## 2014-06-16 MED ORDER — PHENYLEPHRINE HCL 10 MG/ML IJ SOLN
INTRAMUSCULAR | Status: DC | PRN
  Administered 2014-06-16 (×3): 40 ug via INTRAVENOUS

## 2014-06-16 MED ORDER — MIDAZOLAM HCL 2 MG/2ML IJ SOLN
INTRAMUSCULAR | Status: AC
  Filled 2014-06-16: qty 2

## 2014-06-16 MED ORDER — DOCUSATE SODIUM 100 MG PO CAPS
100.0000 mg | ORAL_CAPSULE | Freq: Two times a day (BID) | ORAL | Status: DC
  Administered 2014-06-17: 100 mg via ORAL

## 2014-06-16 MED ORDER — LABETALOL HCL 5 MG/ML IV SOLN
INTRAVENOUS | Status: AC
  Administered 2014-06-16: 19:00:00
  Filled 2014-06-16: qty 4

## 2014-06-16 MED ORDER — SODIUM CHLORIDE 0.9 % IJ SOLN
INTRAMUSCULAR | Status: AC
  Filled 2014-06-16: qty 50

## 2014-06-16 MED ORDER — LACTATED RINGERS IV BOLUS (SEPSIS)
500.0000 mL | Freq: Once | INTRAVENOUS | Status: AC
  Administered 2014-06-16: 500 mL via INTRAVENOUS

## 2014-06-16 MED ORDER — IPRATROPIUM-ALBUTEROL 0.5-2.5 (3) MG/3ML IN SOLN: 3.0000 mL | RESPIRATORY_TRACT | Status: DC

## 2014-06-16 MED ORDER — HYDRALAZINE HCL 20 MG/ML IJ SOLN
5.0000 mg | Freq: Once | INTRAMUSCULAR | Status: AC
  Administered 2014-06-16: 5 mg via INTRAVENOUS

## 2014-06-16 MED ORDER — HYDROMORPHONE HCL 1 MG/ML IJ SOLN
0.2500 mg | INTRAMUSCULAR | Status: DC | PRN
  Administered 2014-06-16 (×2): 0.5 mg via INTRAVENOUS

## 2014-06-16 MED ORDER — FUROSEMIDE 10 MG/ML IJ SOLN
INTRAMUSCULAR | Status: AC
  Administered 2014-06-16: 20 mg via INTRAMUSCULAR
  Filled 2014-06-16: qty 2

## 2014-06-16 MED ORDER — ASPIRIN EC 325 MG PO TBEC
325.0000 mg | DELAYED_RELEASE_TABLET | Freq: Two times a day (BID) | ORAL | Status: DC
  Administered 2014-06-17: 325 mg via ORAL
  Filled 2014-06-16 (×3): qty 1

## 2014-06-16 MED ORDER — ROPIVACAINE HCL 5 MG/ML IJ SOLN
INTRAMUSCULAR | Status: DC | PRN
  Administered 2014-06-16: 30 mL via PERINEURAL

## 2014-06-16 MED ORDER — SUCCINYLCHOLINE CHLORIDE 20 MG/ML IJ SOLN
INTRAMUSCULAR | Status: DC | PRN
  Administered 2014-06-16: 100 mg via INTRAVENOUS

## 2014-06-16 MED ORDER — TRANEXAMIC ACID 100 MG/ML IV SOLN
1000.0000 mg | INTRAVENOUS | Status: AC
  Administered 2014-06-16: 1000 mg via INTRAVENOUS

## 2014-06-16 MED ORDER — BISACODYL 10 MG RE SUPP: 10.0000 mg | Freq: Every day | RECTAL | Status: DC | PRN

## 2014-06-16 MED ORDER — HYDROMORPHONE HCL 2 MG/ML IJ SOLN
INTRAMUSCULAR | Status: AC
  Filled 2014-06-16: qty 1

## 2014-06-16 MED ORDER — BUPIVACAINE-EPINEPHRINE (PF) 0.25% -1:200000 IJ SOLN
INTRAMUSCULAR | Status: AC
  Filled 2014-06-16: qty 30

## 2014-06-16 MED ORDER — HYDROCORTISONE NA SUCCINATE PF 100 MG IJ SOLR
INTRAMUSCULAR | Status: DC | PRN
  Administered 2014-06-16: 50 mg via INTRAVENOUS

## 2014-06-16 MED ORDER — FENTANYL CITRATE 0.05 MG/ML IJ SOLN
50.0000 ug | INTRAMUSCULAR | Status: DC | PRN
  Administered 2014-06-16: 50 ug via INTRAVENOUS

## 2014-06-16 MED ORDER — KETOROLAC TROMETHAMINE 30 MG/ML IJ SOLN
INTRAMUSCULAR | Status: DC | PRN
  Administered 2014-06-16: 30 mg

## 2014-06-16 MED ORDER — LABETALOL HCL 5 MG/ML IV SOLN
2.5000 mg | INTRAVENOUS | Status: DC | PRN
  Administered 2014-06-16: 2.5 mg via INTRAVENOUS

## 2014-06-16 MED ORDER — DEXAMETHASONE SODIUM PHOSPHATE 10 MG/ML IJ SOLN
INTRAMUSCULAR | Status: AC
  Filled 2014-06-16: qty 1

## 2014-06-16 MED ORDER — METHOCARBAMOL 500 MG PO TABS
500.0000 mg | ORAL_TABLET | Freq: Four times a day (QID) | ORAL | Status: DC | PRN
  Administered 2014-06-17: 500 mg via ORAL
  Filled 2014-06-16: qty 1

## 2014-06-16 MED ORDER — PREDNISONE 5 MG PO TABS
5.0000 mg | ORAL_TABLET | Freq: Every day | ORAL | Status: DC
  Filled 2014-06-16: qty 1

## 2014-06-16 MED ORDER — ALUM & MAG HYDROXIDE-SIMETH 200-200-20 MG/5ML PO SUSP: 30.0000 mL | ORAL | Status: DC | PRN

## 2014-06-16 MED ORDER — FENTANYL CITRATE 0.05 MG/ML IJ SOLN
INTRAMUSCULAR | Status: DC | PRN
  Administered 2014-06-16 (×5): 50 ug via INTRAVENOUS

## 2014-06-16 MED ORDER — CEFAZOLIN SODIUM-DEXTROSE 2-3 GM-% IV SOLR
2.0000 g | Freq: Four times a day (QID) | INTRAVENOUS | Status: DC
  Filled 2014-06-16: qty 50

## 2014-06-16 MED ORDER — HYDROMORPHONE HCL 1 MG/ML IJ SOLN
INTRAMUSCULAR | Status: DC | PRN
  Administered 2014-06-16: 1 mg via INTRAVENOUS

## 2014-06-16 MED ORDER — PROPOFOL 10 MG/ML IV BOLUS
INTRAVENOUS | Status: DC | PRN
  Administered 2014-06-16: 150 mg via INTRAVENOUS

## 2014-06-16 MED ORDER — MAGNESIUM CITRATE PO SOLN: 1.0000 | Freq: Once | ORAL | Status: AC | PRN

## 2014-06-16 MED ORDER — METOCLOPRAMIDE HCL 5 MG/ML IJ SOLN: 5.0000 mg | Freq: Three times a day (TID) | INTRAMUSCULAR | Status: DC | PRN

## 2014-06-16 MED ORDER — LIDOCAINE HCL (CARDIAC) 20 MG/ML IV SOLN
INTRAVENOUS | Status: DC | PRN
  Administered 2014-06-16: 50 mg via INTRAVENOUS

## 2014-06-16 MED ORDER — SODIUM CHLORIDE 0.9 % IV SOLN
INTRAVENOUS | Status: DC
  Administered 2014-06-17: 02:00:00 via INTRAVENOUS

## 2014-06-16 MED ORDER — METOCLOPRAMIDE HCL 10 MG PO TABS: 5.0000 mg | ORAL_TABLET | Freq: Three times a day (TID) | ORAL | Status: DC | PRN

## 2014-06-16 MED ORDER — LACTATED RINGERS IV SOLN
INTRAVENOUS | Status: DC | PRN
  Administered 2014-06-16 (×2): via INTRAVENOUS

## 2014-06-16 MED ORDER — ONDANSETRON HCL 4 MG/2ML IJ SOLN
INTRAMUSCULAR | Status: AC
  Filled 2014-06-16: qty 2

## 2014-06-16 MED ORDER — SODIUM CHLORIDE 0.9 % IV SOLN
1000.0000 mg | INTRAVENOUS | Status: DC
  Filled 2014-06-16: qty 10

## 2014-06-16 MED ORDER — KETOROLAC TROMETHAMINE 30 MG/ML IJ SOLN
INTRAMUSCULAR | Status: AC
  Filled 2014-06-16: qty 1

## 2014-06-16 MED ORDER — POLYETHYLENE GLYCOL 3350 17 G PO PACK
17.0000 g | PACK | Freq: Two times a day (BID) | ORAL | Status: DC
  Administered 2014-06-17: 17 g via ORAL

## 2014-06-16 MED ORDER — DEXTROSE 5 % IV SOLN
500.0000 mg | Freq: Four times a day (QID) | INTRAVENOUS | Status: DC | PRN
  Administered 2014-06-16 (×2): 500 mg via INTRAVENOUS
  Filled 2014-06-16 (×3): qty 5

## 2014-06-16 MED ORDER — 0.9 % SODIUM CHLORIDE (POUR BTL) OPTIME
TOPICAL | Status: DC | PRN
  Administered 2014-06-16: 1000 mL

## 2014-06-16 MED ORDER — FENTANYL CITRATE 0.05 MG/ML IJ SOLN
INTRAMUSCULAR | Status: AC
  Filled 2014-06-16: qty 2

## 2014-06-16 MED ORDER — LACTATED RINGERS IV SOLN
INTRAVENOUS | Status: DC
  Administered 2014-06-16 (×2): 1000 mL via INTRAVENOUS

## 2014-06-16 MED ORDER — FENTANYL CITRATE 0.05 MG/ML IJ SOLN: 25.0000 ug | INTRAMUSCULAR | Status: DC | PRN

## 2014-06-16 MED ORDER — HYDROCORTISONE NA SUCCINATE PF 100 MG IJ SOLR
50.0000 mg | Freq: Once | INTRAMUSCULAR | Status: AC
  Administered 2014-06-17: 50 mg via INTRAVENOUS
  Filled 2014-06-16: qty 1

## 2014-06-16 MED ORDER — CEFAZOLIN SODIUM-DEXTROSE 2-3 GM-% IV SOLR
INTRAVENOUS | Status: DC | PRN
  Administered 2014-06-16: 2 g via INTRAVENOUS

## 2014-06-16 MED ORDER — PROPOFOL 10 MG/ML IV BOLUS
INTRAVENOUS | Status: AC
  Filled 2014-06-16: qty 20

## 2014-06-16 MED ORDER — HYDROMORPHONE HCL 1 MG/ML IJ SOLN
0.5000 mg | INTRAMUSCULAR | Status: DC | PRN
  Administered 2014-06-16: 0.5 mg via INTRAVENOUS
  Filled 2014-06-16: qty 1

## 2014-06-16 MED ORDER — BUPIVACAINE-EPINEPHRINE (PF) 0.25% -1:200000 IJ SOLN
INTRAMUSCULAR | Status: DC | PRN
  Administered 2014-06-16: 30 mL

## 2014-06-16 MED ORDER — ROPIVACAINE HCL 5 MG/ML IJ SOLN
INTRAMUSCULAR | Status: AC
  Filled 2014-06-16: qty 30

## 2014-06-16 MED ORDER — CEFAZOLIN SODIUM-DEXTROSE 2-3 GM-% IV SOLR
INTRAVENOUS | Status: AC
  Filled 2014-06-16: qty 50

## 2014-06-16 MED ORDER — MIDAZOLAM HCL 2 MG/2ML IJ SOLN
1.0000 mg | INTRAMUSCULAR | Status: DC | PRN
  Administered 2014-06-16: 1 mg via INTRAVENOUS
  Filled 2014-06-16: qty 2

## 2014-06-16 MED ORDER — HYDRALAZINE HCL 20 MG/ML IJ SOLN
INTRAMUSCULAR | Status: AC
  Administered 2014-06-16: 20:00:00
  Filled 2014-06-16: qty 1

## 2014-06-16 SURGICAL SUPPLY — 64 items
BAG DECANTER FOR FLEXI CONT (MISCELLANEOUS) IMPLANT
BAG SPEC THK2 15X12 ZIP CLS (MISCELLANEOUS)
BAG ZIPLOCK 12X15 (MISCELLANEOUS) IMPLANT
BANDAGE ELASTIC 6 VELCRO ST LF (GAUZE/BANDAGES/DRESSINGS) ×3 IMPLANT
BANDAGE ESMARK 6X9 LF (GAUZE/BANDAGES/DRESSINGS) ×1 IMPLANT
BLADE SAW SGTL 13.0X1.19X90.0M (BLADE) ×3 IMPLANT
BNDG CMPR 9X6 STRL LF SNTH (GAUZE/BANDAGES/DRESSINGS) ×1
BNDG ESMARK 6X9 LF (GAUZE/BANDAGES/DRESSINGS) ×3
BOWL SMART MIX CTS (DISPOSABLE) ×3 IMPLANT
CEMENT HV SMART SET (Cement) ×4 IMPLANT
CUFF TOURN SGL QUICK 34 (TOURNIQUET CUFF) ×3
CUFF TRNQT CYL 34X4X40X1 (TOURNIQUET CUFF) ×1 IMPLANT
DECANTER SPIKE VIAL GLASS SM (MISCELLANEOUS) ×3 IMPLANT
DRAPE EXTREMITY T 121X128X90 (DRAPE) ×3 IMPLANT
DRAPE POUCH INSTRU U-SHP 10X18 (DRAPES) ×3 IMPLANT
DRAPE U-SHAPE 47X51 STRL (DRAPES) ×3 IMPLANT
DRSG AQUACEL AG ADV 3.5X10 (GAUZE/BANDAGES/DRESSINGS) ×3 IMPLANT
DURAPREP 26ML APPLICATOR (WOUND CARE) ×6 IMPLANT
ELECT REM PT RETURN 9FT ADLT (ELECTROSURGICAL) ×3
ELECTRODE REM PT RTRN 9FT ADLT (ELECTROSURGICAL) ×1 IMPLANT
FACESHIELD WRAPAROUND (MASK) ×15 IMPLANT
FACESHIELD WRAPAROUND OR TEAM (MASK) ×5 IMPLANT
GAUZE SPONGE 4X4 12PLY STRL (GAUZE/BANDAGES/DRESSINGS) ×2 IMPLANT
GAUZE XEROFORM 1X8 LF (GAUZE/BANDAGES/DRESSINGS) ×2 IMPLANT
GLOVE BIOGEL PI IND STRL 7.5 (GLOVE) ×1 IMPLANT
GLOVE BIOGEL PI IND STRL 8.5 (GLOVE) ×1 IMPLANT
GLOVE BIOGEL PI INDICATOR 7.5 (GLOVE) ×2
GLOVE BIOGEL PI INDICATOR 8.5 (GLOVE) ×2
GLOVE ECLIPSE 8.0 STRL XLNG CF (GLOVE) ×3 IMPLANT
GLOVE ORTHO TXT STRL SZ7.5 (GLOVE) ×6 IMPLANT
GOWN SPEC L3 XXLG W/TWL (GOWN DISPOSABLE) ×3 IMPLANT
GOWN STRL REUS W/TWL LRG LVL3 (GOWN DISPOSABLE) ×3 IMPLANT
HANDPIECE INTERPULSE COAX TIP (DISPOSABLE) ×3
IMPL FEMUR SIGMA LT PS SZ 3 (Knees) IMPLANT
IMPLANT FEMUR SIGMA LT PS SZ 3 (Knees) ×3 IMPLANT
KIT BASIN OR (CUSTOM PROCEDURE TRAY) ×3 IMPLANT
LIQUID BAND (GAUZE/BANDAGES/DRESSINGS) ×3 IMPLANT
MANIFOLD NEPTUNE II (INSTRUMENTS) ×3 IMPLANT
NDL SAFETY ECLIPSE 18X1.5 (NEEDLE) ×1 IMPLANT
NEEDLE HYPO 18GX1.5 SHARP (NEEDLE) ×3
PACK TOTAL JOINT (CUSTOM PROCEDURE TRAY) ×3 IMPLANT
PATELLA DOME PFC 38MM (Knees) ×2 IMPLANT
PEN SKIN MARKING BROAD (MISCELLANEOUS) ×3 IMPLANT
PLATE ROT INSERT 12.5MM SIZE 3 (Plate) ×2 IMPLANT
POSITIONER SURGICAL ARM (MISCELLANEOUS) ×3 IMPLANT
SET HNDPC FAN SPRY TIP SCT (DISPOSABLE) ×1 IMPLANT
SET PAD KNEE POSITIONER (MISCELLANEOUS) ×3 IMPLANT
SUCTION FRAZIER 12FR DISP (SUCTIONS) ×3 IMPLANT
SUT ETHILON 3 0 PS 1 (SUTURE) ×4 IMPLANT
SUT MNCRL AB 4-0 PS2 18 (SUTURE) ×3 IMPLANT
SUT VIC AB 1 CT1 36 (SUTURE) ×3 IMPLANT
SUT VIC AB 2-0 CT1 27 (SUTURE) ×9
SUT VIC AB 2-0 CT1 TAPERPNT 27 (SUTURE) ×3 IMPLANT
SUT VLOC 180 0 24IN GS25 (SUTURE) ×3 IMPLANT
SYR 50ML LL SCALE MARK (SYRINGE) ×3 IMPLANT
TAPE CLOTH SURG 4X10 WHT LF (GAUZE/BANDAGES/DRESSINGS) ×2 IMPLANT
TOWEL OR 17X26 10 PK STRL BLUE (TOWEL DISPOSABLE) ×3 IMPLANT
TOWEL OR NON WOVEN STRL DISP B (DISPOSABLE) IMPLANT
TRAY FOLEY CATH 14FRSI W/METER (CATHETERS) ×3 IMPLANT
TRAY REVISION SZ 3 (Knees) ×2 IMPLANT
TRAY SLEEVE CEM ML (Knees) ×2 IMPLANT
WATER STERILE IRR 1500ML POUR (IV SOLUTION) ×3 IMPLANT
WRAP KNEE MAXI GEL POST OP (GAUZE/BANDAGES/DRESSINGS) ×3 IMPLANT
YANKAUER SUCT BULB TIP 10FT TU (MISCELLANEOUS) ×3 IMPLANT

## 2014-06-16 NOTE — Progress Notes (Signed)
AssistedDr. Delma Post with left femoral nerve block. Side rails up, monitors on throughout procedure. See vital signs in flow sheet. Tolerated Procedure well.

## 2014-06-16 NOTE — Care Management Note (Addendum)
    Page 1 of 1   06/17/2014     11:33:24 AM CARE MANAGEMENT NOTE 06/17/2014  Patient:  LESLYE, PUCCINI   Account Number:  [id number]  Date Initiated:  06/16/2014  Documentation initiated by:  [facility]  Subjective/Objective Assessment:   adm: LEFT TKR     Action/Plan:   discharger planning   Anticipated DC Date:  06/19/2014   Anticipated DC Plan:  [place]  CM consult      Choice offered to / List presented to:             Status of service:  Completed, signed off Medicare Important Message given?  YES (If response is "NO", the following Medicare IM given date fields will be blank) Date Medicare IM given:  06/17/2014 Medicare IM given by:   Date Additional Medicare IM given:   Additional Medicare IM given by:    Discharge Disposition:  [place]  Per UR Regulation:    If discussed at Long Length of Stay Meetings, dates discussed:    Comments:  06/16/14 12:50 CM notes pt will return to SNF; post surgically.  CSW arranging.  No other CM needs were communicated. Mariane Masters, BSN, Cm [phone].

## 2014-06-16 NOTE — Progress Notes (Signed)
Patient ID: [name], female   DOB: 08-24-1934, 80 y.o.   MRN: [id number]  [facility] PROGRESS NOTE  [name] NOB:[id number] DOB: 1934-09-29 DOA: 06/13/2014 PCP: Hollace Kinnier, DO   Brief narrative:    Pt is 80 yo female who recently suffered left tibial fracture, sees Dr. Alvan Dame, presented to [facility] ED after an episode of lightheadedness that occurred earlier in the day prior to this admission. Pt explains she went to there bathroom and was sitting on the toilet and when she stood up she felt lightheaded and fell down hitting her left shoulder. She is now concerned with persistent left shoulder pain that is throbbing and sharp, 5/10 in severity, non radiating, no specific alleviating factors, worse with movement. She denies chest pain or shortness of breath, no specific abdominal or urinary concerns, no similar events in the past. Pt denies any numbness or tingling, no focal neurological symptoms.   In ED, pt noted to be hemodynamically stable, VSS, imaging studies notable for clavicular fracture. SW met with pt and able to find placement for AM. TRH asked to admit for observation overnight and pain control.  Major events since admission: 4/5 - plan to take to OR by Dr. Alvan Dame  Assessment/Plan:    Active Problems: Syncopal event - unclear what is the precipitation factor - stable orthostatic vitals  - blood work unremarkable - PT evaluation done, SNF recommended when pt medically ready  Acute left clavicular fracture - OT evaluation pending  - pt will need sling but says she is more comfortable without it - pain is currently controlled well on pain medical regimen  - continue to provide analgesia as needed  Left tibial fracture - plan to take to OR today by Dr. Alvan Dame - will need SNF upon discharge and can likely be d/c 24- 48 hours post op depending on clinical course  Hypokalemia - supplemented Hypothyroidism - continue synthroid HTN, accelerated  - continue  HCTZ RA - on prednisone and humira, continue here  DVT prophylaxis - Lovenox SQ  Code Status: Full.  Family Communication:  plan of care discussed with the patient Disposition Plan: SNF possibly in 24 - 48 hours post op, depending on the post op clinical course   IV access:  Peripheral IV  Procedures and diagnostic studies:     Ct Knee Left Wo Contrast 06/12/2014 Comminuted depressed intra-articular fracture of the posterior aspect of the lateral tibial plateau as described. 2. Associated hemarthrosis and probable synovitis. 3. Hyperextension of the knee suggesting possible ACL injury.   Ct 3d Independent Wkst 06/12/2014 Comminuted depressed intra-articular fracture of the posterior aspect of the lateral tibial plateau as described. 2. Associated hemarthrosis and probable synovitis. 3. Hyperextension of the knee suggesting possible ACL injury.   Dg Shoulder Left 06/13/2014 Acute minimally displaced left distal clavicle fracture. Osteopenia Healed proximal left humerus surgical neck fracture with deformity   Medical Consultants:  Ortho team - Dr. Alvan Dame   Other Consultants:  PT/OT  IAnti-Infectives:   None  Faye Ramsay, MD  [facility] Pager [phone]  If 7PM-7AM, please contact night-coverage [email or web address] Password [facility] 06/16/2014, 12:54 PM   LOS: 3 days   HPI/Subjective: No events overnight.   Objective: Filed Vitals:   06/15/14 1414 06/15/14 1910 06/15/14 2129 06/16/14 0656  BP: 149/73 155/60 152/62 151/75  Pulse: 56 74 70 66  Temp: 97.8 F (36.6 C) 98.2 F (36.8 C) 98.6 F (37 C) 97.8 F (36.6 C)  TempSrc: Oral Oral Oral Oral  Resp:  _0 Height:      Weight:      SpO2: 96%  96% 96%    Intake/Output Summary (Last 24 hours) at 06/16/14 1254 Last data filed at 06/16/14 1200  Gross per 24 hour  Intake    240 ml  Output   1501 ml  Net  -1261 ml    Exam:   General:  Pt is alert, follows commands appropriately, not in acute  distress  Cardiovascular: Regular rate and rhythm, no rubs, no gallops  Respiratory: Clear to auscultation bilaterally, no wheezing, no crackles, no rhonchi  Abdomen: Soft, non tender, non distended, bowel sounds present, no guarding  Extremities: valgus in lowe extremity, pulses DP and PT palpable bilaterally  Data Reviewed: Basic Metabolic Panel:  Recent Labs Lab 06/13/14 1407 06/14/14 0425  NA 141 140  K 3.1* 4.0  CL 100 107  CO2 29 27  GLUCOSE 102* 91  BUN 21 26*  CREATININE 0.74 0.82  CALCIUM 8.5 7.8*   CBC:  Recent Labs Lab 06/13/14 1407 06/14/14 0425  WBC 9.2 6.1  HGB 14.8 12.2  HCT 44.8 37.7  MCV 92.4 93.1  PLT 218 199   CBG:  Recent Labs Lab 06/13/14 1412  GLUCAP 95    Scheduled Meds: . aspirin EC  81 mg Oral QODAY  . celecoxib  200 mg Oral BID  . enoxaparin (LOVENOX) injection  40 mg Subcutaneous Q24H  . folic acid  2 mg Oral Daily  . hydrochlorothiazide  25 mg Oral Q breakfast  . latanoprost  1 drop Both Eyes QHS  . leflunomide  20 mg Oral Daily  . levothyroxine  88 mcg Oral QAC breakfast  . medroxyPROGESTERone  2.5 mg Oral QHS  . multivitamin with minerals  1 tablet Oral Daily  . nicotine  21 mg Transdermal Daily  . predniSONE  5 mg Oral Q breakfast  . saccharomyces boulardii  250 mg Oral BID   Continuous Infusions:

## 2014-06-16 NOTE — Anesthesia Preprocedure Evaluation (Addendum)
Anesthesia Evaluation  Patient identified by MRN, date of birth, ID band Patient awake    Reviewed: Allergy & Precautions, NPO status , Patient's Chart, lab work & pertinent test results  History of Anesthesia Complications (+) history of anesthetic complications  Airway Mallampati: II  TM Distance: >3 FB Neck ROM: Full    Dental no notable dental hx.    Pulmonary pneumonia -, resolved,  breath sounds clear to auscultation  Pulmonary exam normal       Cardiovascular hypertension, Pt. on medications Rhythm:Regular Rate:Normal     Neuro/Psych Anxiety negative neurological ROS     GI/Hepatic Neg liver ROS, PUD,   Endo/Other  Hypothyroidism   Renal/GU negative Renal ROS  negative genitourinary   Musculoskeletal  (+) Arthritis -,   Abdominal   Peds negative pediatric ROS (+)  Hematology negative hematology ROS (+)   Anesthesia Other Findings   Reproductive/Obstetrics negative OB ROS                            Anesthesia Physical Anesthesia Plan  ASA: II  Anesthesia Plan: General   Post-op Pain Management:    Induction: Intravenous  Airway Management Planned: Oral ETT  Additional Equipment:   Intra-op Plan:   Post-operative Plan: Extubation in OR  Informed Consent: I have reviewed the patients History and Physical, chart, labs and discussed the procedure including the risks, benefits and alternatives for the proposed anesthesia with the patient or authorized representative who has indicated his/her understanding and acceptance.   Dental advisory given  Plan Discussed with: CRNA  Anesthesia Plan Comments: (S/P lumbar fusion, multiple levels. Plan general with femoral nerve block. Discussed risks of femoral nerve block including failure, bleeding, infection, nerve damage.  Femoral nerve block does not usually prevent all pain. Specifically, it treats the anterior, but often not  the posterior knee. Questions answered.  Patient consents to block.)       Anesthesia Quick Evaluation

## 2014-06-16 NOTE — Anesthesia Procedure Notes (Addendum)
Anesthesia Regional Block:  Femoral nerve block  Pre-Anesthetic Checklist: ,, timeout performed, Correct Patient, Correct Site, Correct Laterality, Correct Procedure, Correct Position, site marked, Risks and benefits discussed,  Surgical consent,  Pre-op evaluation,  At surgeon's request and post-op pain management  Laterality: Lower and Left  Prep: chloraprep       Needles:  Injection technique: Single-shot  Needle Type: Stimiplex      Needle Gauge: 21 and 21 G    Additional Needles:  Procedures: ultrasound guided (picture in chart) and nerve stimulator Femoral nerve block  Nerve Stimulator or Paresthesia:  Response: quadriceps, 0.6 mA,   Additional Responses:   Narrative:  Injection made incrementally with aspirations every 5 mL.  Performed by: Personally  Anesthesiologist: Franne Grip  Additional Notes: No pain on injection. No increased resistance to injection. Motor intact immediately after block. Loss of quadriceps strength at 20 minutes. Meaningful verbal contact maintained throughout block placement.   Procedure Name: Intubation Date/Time: 06/16/2014 4:26 PM Performed by: Ofilia Neas Pre-anesthesia Checklist: Patient identified, Timeout performed, Emergency Drugs available, Suction available and Patient being monitored Patient Re-evaluated:Patient Re-evaluated prior to inductionOxygen Delivery Method: Circle system utilized Preoxygenation: Pre-oxygenation with 100% oxygen Intubation Type: IV induction and Cricoid Pressure applied Ventilation: Mask ventilation without difficulty Laryngoscope Size: Mac and 3 Grade View: Grade I Tube type: Oral Number of attempts: 1 Airway Equipment and Method: Stylet Placement Confirmation: ETT inserted through vocal cords under direct vision,  positive ETCO2 and breath sounds checked- equal and bilateral Secured at: 21 cm Tube secured with: Tape Dental Injury: Teeth and Oropharynx as per pre-operative assessment

## 2014-06-16 NOTE — Anesthesia Postprocedure Evaluation (Signed)
  Anesthesia Post-op Note  [name]  Procedure(s) Performed: Procedure(s) (LRB): TOTAL KNEE ARTHROPLASTY (Left)  Patient Location: PACU  Anesthesia Type: GA combined with regional for post-op pain  Level of Consciousness: awake and alert   Airway and Oxygen Therapy: Patient Spontanous Breathing  Post-op Pain: mild  Post-op Assessment: Post-op Vital signs reviewed, Patient's Cardiovascular Status Stable, Respiratory Function Stable, Patent Airway and No signs of Nausea or vomiting  Last Vitals:  Filed Vitals:   06/16/14 2014  BP: 150/77  Pulse:   Temp: 37.1 C  Resp: 16    Post-op Vital Signs: stable   Complications: No apparent anesthesia complications

## 2014-06-16 NOTE — Transfer of Care (Signed)
Immediate Anesthesia Transfer of Care Note  [name]  Procedure(s) Performed: Procedure(s): TOTAL KNEE ARTHROPLASTY (Left)  Patient Location: PACU  Anesthesia Type:General  Level of Consciousness: awake, alert  and patient cooperative  Airway & Oxygen Therapy: Patient Spontanous Breathing and Patient connected to face mask oxygen  Post-op Assessment: Report given to RN and Post -op Vital signs reviewed and stable  Post vital signs: Reviewed and stable  Last Vitals:  Filed Vitals:   06/16/14 1845  BP: 172/91  Pulse: 100  Temp: 36.8 C  Resp: 14    Complications: No apparent anesthesia complications

## 2014-06-16 NOTE — Progress Notes (Signed)
CSW met with pt today to assist with d/c planning. Pt will have surgery today. Pt has a ST Rehab bed at [facility] ) following hospital d/c. Pt is looking forward to having rehab at [place]. CSW will continue to follow to assist with d/c planning to SNF.  Werner Lean LCSW [phone]

## 2014-06-16 NOTE — Progress Notes (Signed)
Patient ID: [name], female   DOB: 1934-07-23, 80 y.o.   MRN: [id number]  Doing OK, no events or complaints from last night  Ready to go to OR today for LEFT TKR NPO since MN Consent ordered  post op may require SNF due to lack of support at home and related to concomitant ipsilateral clavicle fracture

## 2014-06-16 NOTE — Progress Notes (Signed)
OT Cancellation Note  Patient Details Name: [name] MRN: [id number] DOB: 03-28-1934   Cancelled Treatment:    Reason Eval/Treat Not Completed: Other (comment) Note plan for surgery today.  Jules Schick  [phone] 06/16/2014, 8:34 AM

## 2014-06-17 DIAGNOSIS — S42002A Fracture of unspecified part of left clavicle, initial encounter for closed fracture: Secondary | ICD-10-CM

## 2014-06-17 DIAGNOSIS — E876 Hypokalemia: Secondary | ICD-10-CM

## 2014-06-17 DIAGNOSIS — D62 Acute posthemorrhagic anemia: Secondary | ICD-10-CM

## 2014-06-17 DIAGNOSIS — Z96652 Presence of left artificial knee joint: Secondary | ICD-10-CM

## 2014-06-17 DIAGNOSIS — R55 Syncope and collapse: Secondary | ICD-10-CM

## 2014-06-17 DIAGNOSIS — I10 Essential (primary) hypertension: Secondary | ICD-10-CM | POA: Insufficient documentation

## 2014-06-17 LAB — BASIC METABOLIC PANEL
ANION GAP: 7 (ref 5–15)
BUN: 20 mg/dL (ref 6–23)
CHLORIDE: 106 mmol/L (ref 96–112)
CO2: 28 mmol/L (ref 19–32)
CREATININE: 0.7 mg/dL (ref 0.50–1.10)
Calcium: 7.8 mg/dL — ABNORMAL LOW (ref 8.4–10.5)
GFR, EST NON AFRICAN AMERICAN: 81 mL/min — AB (ref >90)
Glucose, Bld: 99 mg/dL (ref 70–99)
Potassium: 3.7 mmol/L (ref 3.5–5.1)
Sodium: 141 mmol/L (ref 135–145)

## 2014-06-17 LAB — CBC
HCT: 36.1 % (ref 36.0–46.0)
Hemoglobin: 11.6 g/dL — ABNORMAL LOW (ref 12.0–15.0)
MCH: 30.3 pg (ref 26.0–34.0)
MCHC: 32.1 g/dL (ref 30.0–36.0)
MCV: 94.3 fL (ref 78.0–100.0)
PLATELETS: 190 10*3/uL (ref 150–400)
RBC: 3.83 MIL/uL — ABNORMAL LOW (ref 3.87–5.11)
RDW: 15.2 % (ref 11.5–15.5)
WBC: 8.8 10*3/uL (ref 4.0–10.5)

## 2014-06-17 MED ORDER — ASPIRIN EC 81 MG PO TBEC: 81.0000 mg | DELAYED_RELEASE_TABLET | ORAL | Status: DC

## 2014-06-17 MED ORDER — ASPIRIN 325 MG PO TBEC: 325.0000 mg | DELAYED_RELEASE_TABLET | Freq: Two times a day (BID) | ORAL | Status: DC

## 2014-06-17 MED ORDER — FERROUS SULFATE 325 (65 FE) MG PO TABS: 325.0000 mg | ORAL_TABLET | Freq: Three times a day (TID) | ORAL | Status: DC

## 2014-06-17 MED ORDER — OXYCODONE-ACETAMINOPHEN 5-325 MG PO TABS: 1.0000 | ORAL_TABLET | Freq: Three times a day (TID) | ORAL | Status: DC | PRN

## 2014-06-17 MED ORDER — BISACODYL 10 MG RE SUPP: 10.0000 mg | Freq: Every day | RECTAL | Status: DC | PRN

## 2014-06-17 MED ORDER — HYDROCODONE-ACETAMINOPHEN 5-325 MG PO TABS: 1.0000 | ORAL_TABLET | Freq: Four times a day (QID) | ORAL | Status: DC | PRN

## 2014-06-17 MED ORDER — DOCUSATE SODIUM 100 MG PO CAPS: 100.0000 mg | ORAL_CAPSULE | Freq: Two times a day (BID) | ORAL | Status: DC

## 2014-06-17 MED ORDER — POLYETHYLENE GLYCOL 3350 17 G PO PACK: 17.0000 g | PACK | Freq: Two times a day (BID) | ORAL | Status: DC

## 2014-06-17 MED ORDER — DIAZEPAM 5 MG PO TABS
5.0000 mg | ORAL_TABLET | Freq: Four times a day (QID) | ORAL | Status: DC | PRN
Start: 2014-06-17 — End: 2014-10-15

## 2014-06-17 NOTE — Discharge Summary (Addendum)
Physician Discharge Summary  PLEASANT BRITZ QVZ:[id number] DOB: 05/30/1934 DOA: 06/13/2014  PCP: Hollace Kinnier, DO  Admit date: 06/13/2014 Discharge date: 06/17/2014  Time spent: >30 minutes  Recommendations for Outpatient Follow-up:  1. Check CBC in 1 week to follow Hgb trend 2. Check BMET to follow electrolytes and renal function 3. Reassess BP and adjust medications as needed  Discharge Diagnoses:  Principal Problem:   S/P left TKA Active Problems:   Clavicle fracture ABLA (mild; due to blood lost with surgery) Essential HTN Severe RA Vasovagal Syncope Hypokalemia Hypothyroidism    Discharge Condition: stable and improved. Discharge to SNF for physical rehabilitation. Follow with PCP in 10 days after been discharge from SNF. Follow up with Dr. Alvan Dame in 2 weeks  Diet recommendation: heart healthy diet   Filed Weights   06/13/14 1856  Weight: 60.601 kg (133 lb 9.6 oz)    History of present illness:  80 yo female who recently suffered left tibial fracture, sees Dr. Alvan Dame, presented to [facility] ED after an episode of lightheadedness that occurred earlier in the day prior to this admission. Pt explains she went to there bathroom and was sitting on the toilet and when she stood up she felt lightheaded and fell down hitting her left shoulder. She is now concerned with persistent left shoulder pain that is throbbing and sharp, 5/10 in severity, non radiating, no specific alleviating factors, worse with movement. She denies chest pain or shortness of breath, no specific abdominal or urinary concerns, no similar events in the past. Pt denies any numbness or tingling, no focal neurological symptoms.   Hospital Course:  Syncopal event -appears to be vasovagal in nature -stable orthostatic vitals  -blood work unremarkable and no signs of infection -PT evaluation done, SNF recommended for rehabilitation -if patient experience further events, will recommend event monitoring   Acute left  clavicular fracture -pt will need sling (especially when up) and light activity   -continue to provide analgesia as needed   Left tibial fracture -S/P TKA by Dr. Alvan Dame -wound care and post operative care by orthopedic service -pain is well controlled  Mild anemia due to blood loss with surgery -Will discharge on ferrous sulfate -CBC to be done in 1 week to follow hgb trend  Hypokalemia -Repleted and WNL at discharge  Hypothyroidism -continue synthroid  HTN, accelerated (on admission due to pain most likely) -continue HCTZ -advise to follow low sodium diet  -BP is now stable  Tobacco abuse -cessation counseling provided -patient with nicotine patch while inpatient  RA -on prednisone and humira and [place], continue current medication and outpatient follow up with rheumatologist    DVT prophylaxis -with full dose Aspirin twice a day for 2 weeks   Procedures:  Left TKA 06/16/14  Consultations:  [facility]ed[facility] (Dr. Alvan Dame)   Discharge Exam: Filed Vitals:   06/17/14 1150  BP:   Pulse:   Temp:   Resp: 17    General: AAOX3, no fever, no CP or SOB Cardiovascular: S1 and S2, no rubs or gallops Respiratory: CTA bilaterally Abd: soft, NT, ND, positive BS Extremities: pain and limited mobility on her LUE (due to clavicle fx); also with left LE with dressings in place, clean, dry and intact (has left TKA). No cyanosis and no clubbing. Patient with deformities in her fingers for severe RA Neuro: non focal  Discharge Instructions   Discharge Instructions    Diet - low sodium heart healthy    Complete by:  As directed  Discharge instructions    Complete by:  As directed   Follow a low sodium diet Take medications as prescribed Physical rehabilitation as per SNF protocol Please follow low sodium diet (less than 2.5 gram daily) Stop smoking          Current Discharge Medication List    START taking these medications   Details  bisacodyl  (DULCOLAX) 10 MG suppository Place 1 suppository (10 mg total) rectally daily as needed for moderate constipation. Qty: 12 suppository, Refills: 0    docusate sodium (COLACE) 100 MG capsule Take 1 capsule (100 mg total) by mouth 2 (two) times daily. Qty: 10 capsule, Refills: 0    ferrous sulfate 325 (65 FE) MG tablet Take 1 tablet (325 mg total) by mouth 3 (three) times daily after meals.    polyethylene glycol (MIRALAX / GLYCOLAX) packet Take 17 g by mouth 2 (two) times daily.      CONTINUE these medications which have CHANGED   Details  !! aspirin EC 325 MG EC tablet Take 1 tablet (325 mg total) by mouth 2 (two) times daily.    !! aspirin EC 81 MG tablet Take 1 tablet (81 mg total) by mouth every other day. Hold for 2 weeks while using full dose ASA BID as DVT prophylaxis    diazepam (VALIUM) 5 MG tablet Take 1 tablet (5 mg total) by mouth every 6 (six) hours as needed for muscle spasms. Qty: 20 tablet, Refills: 0    HYDROcodone-acetaminophen (NORCO/VICODIN) 5-325 MG per tablet Take 1 tablet by mouth every 6 (six) hours as needed for moderate pain (pain). Qty: 30 tablet, Refills: 0    oxyCODONE-acetaminophen (PERCOCET/ROXICET) 5-325 MG per tablet Take 1-2 tablets by mouth every 8 (eight) hours as needed for severe pain. Qty: 30 tablet, Refills: 0     !! - Potential duplicate medications found. Please discuss with provider.    CONTINUE these medications which have NOT CHANGED   Details  balsalazide (COLAZAL) 750 MG capsule Take 2,250 mg by mouth 2 (two) times daily.     Calcium Carb-Cholecalciferol (CALCIUM + D3) 600-200 MG-UNIT TABS Take 1 tablet by mouth daily.    CELEBREX 200 MG capsule Take 200 mg by mouth 2 (two) times daily.     Cholecalciferol (VITAMIN D3) 1000 UNITS CAPS Take 1,000 Units by mouth daily.    estradiol (ESTRACE) 0.5 MG tablet Take 0.5 mg by mouth daily.     folic acid (FOLVITE) 1 MG tablet Take 2 mg by mouth daily.    guaiFENesin (MUCINEX) 600 MG 12 hr  tablet Take 1,200 mg by mouth daily as needed (sinus relief).     hydrochlorothiazide (HYDRODIURIL) 25 MG tablet Take 25 mg by mouth daily with breakfast.    latanoprost (XALATAN) 0.005 % ophthalmic solution Place 1 drop into both eyes at bedtime.    leflunomide (ARAVA) 20 MG tablet Take 20 mg by mouth daily. Refills: 1    levothyroxine (SYNTHROID) 88 MCG tablet Take 88 mcg by mouth daily before breakfast. DAW, DO NOT SUBSTITUTE WITH GENERIC    medroxyPROGESTERone (PROVERA) 2.5 MG tablet Take 2.5 mg by mouth at bedtime.     Multiple Vitamin (MULTIVITAMIN WITH MINERALS) TABS Take 1 tablet by mouth daily.    Omega-3 Fatty Acids (FISH OIL) 1200 MG CAPS Take 1,200 mg by mouth daily.    predniSONE (DELTASONE) 5 MG tablet Take 5 mg by mouth daily with breakfast.     adalimumab (HUMIRA) 40 MG/0.8ML injection Inject 0.8 mLs (  40 mg total) into the skin every 14 (fourteen) days. Resume ONLY if off all antibiotics Qty: 2 each, Refills: 0    saccharomyces boulardii (FLORASTOR) 250 MG capsule Take 1 capsule (250 mg total) by mouth 2 (two) times daily. Qty: 28 capsule, Refills: 0      STOP taking these medications     furosemide (LASIX) 20 MG tablet      doxycycline (VIBRAMYCIN) 100 MG capsule        No Known Allergies Follow-up Information    Follow up with Mauri Pole, MD In 2 weeks.   Specialty:  Orthopedic Surgery   Contact information:   [address] [place] [address] [phone]       Follow up with [name], [name], DO.   Specialty:  Geriatric Medicine   Why:  10 days after discharge from SNF    Contact information:   [address] [place] [address] [phone]       The results of significant diagnostics from this hospitalization (including imaging, microbiology, ancillary and laboratory) are listed below for reference.    Significant Diagnostic Studies: Ct Knee Left Wo Contrast  06/12/2014   CLINICAL DATA:  Lateral tibial plateau  fracture. Severe arthritis. Preoperative evaluation.  EXAM: CT OF THE LEFT KNEE WITHOUT CONTRAST; 3-DIMENSIONAL CT IMAGE RENDERING ON INDEPENDENT WORKSTATION  TECHNIQUE: Multidetector CT imaging of the left knee was performed according to the standard protocol. Multiplanar CT image reconstructions were also generated. 3-dimensional CT images were rendered by post-processing of the original CT data on an independent workstation. The 3-dimensional CT images were interpreted and findings were reported in the accompanying complete CT report for this study  COMPARISON:  None.  FINDINGS: The bones are demineralized. There is a comminuted depressed fracture of the lateral tibial plateau posterolaterally. There is approximately 10 mm of depression of the articular surface posteriorly on the coronal images. There are small adjacent intra-articular fracture fragments. This fracture demonstrates no medial extension across the intercondylar eminences or involvement of the medial tibial plateau.  The distal femur, patella and proximal fibula are intact. There are moderate tricompartmental degenerative changes at the knee.  The knee is mildly hyperextended. Acute or chronic injury of the anterior cruciate ligament is possible. There is a moderate size knee joint effusion with irregular synovial thickening suspicious for hemarthrosis and possible synovitis. There is probable hemorrhage within the extensor musculature of the proximal lower leg. Scattered vascular calcifications noted.  3-dimensional CT images were rendered by post-processing of the original CT data at independent workstation. The 3-dimensional CT images were interpreted, and findings were reported in the accompanying complete CT report for this study.  IMPRESSION: 1. Comminuted depressed intra-articular fracture of the posterior aspect of the lateral tibial plateau as described. 2. Associated hemarthrosis and probable synovitis. 3. Hyperextension of the knee  suggesting possible ACL injury.   Electronically Signed   By: Richardean Sale M.D.   On: 06/12/2014 17:48   Ct 3d Independent Darreld Mclean  06/12/2014   CLINICAL DATA:  Lateral tibial plateau fracture. Severe arthritis. Preoperative evaluation.  EXAM: CT OF THE LEFT KNEE WITHOUT CONTRAST; 3-DIMENSIONAL CT IMAGE RENDERING ON INDEPENDENT WORKSTATION  TECHNIQUE: Multidetector CT imaging of the left knee was performed according to the standard protocol. Multiplanar CT image reconstructions were also generated. 3-dimensional CT images were rendered by post-processing of the original CT data on an independent workstation. The 3-dimensional CT images were interpreted and findings were reported in the accompanying complete CT report for  this study  COMPARISON:  None.  FINDINGS: The bones are demineralized. There is a comminuted depressed fracture of the lateral tibial plateau posterolaterally. There is approximately 10 mm of depression of the articular surface posteriorly on the coronal images. There are small adjacent intra-articular fracture fragments. This fracture demonstrates no medial extension across the intercondylar eminences or involvement of the medial tibial plateau.  The distal femur, patella and proximal fibula are intact. There are moderate tricompartmental degenerative changes at the knee.  The knee is mildly hyperextended. Acute or chronic injury of the anterior cruciate ligament is possible. There is a moderate size knee joint effusion with irregular synovial thickening suspicious for hemarthrosis and possible synovitis. There is probable hemorrhage within the extensor musculature of the proximal lower leg. Scattered vascular calcifications noted.  3-dimensional CT images were rendered by post-processing of the original CT data at independent workstation. The 3-dimensional CT images were interpreted, and findings were reported in the accompanying complete CT report for this study.  IMPRESSION: 1. Comminuted  depressed intra-articular fracture of the posterior aspect of the lateral tibial plateau as described. 2. Associated hemarthrosis and probable synovitis. 3. Hyperextension of the knee suggesting possible ACL injury.   Electronically Signed   By: Richardean Sale M.D.   On: 06/12/2014 17:48   Dg Shoulder Left  06/13/2014   CLINICAL DATA:  Fall in bathroom, left shoulder pain, arthritis  EXAM: LEFT SHOULDER - 2+ VIEW  COMPARISON:  None available  FINDINGS: Bones are osteopenic. There is an acute minimally displaced fracture of the left distal clavicle. Alignment of the [facility] joint remains maintained. No separation. Supraclavicular soft tissue swelling suspected. There appears to be a healed proximal left humerus surgical neck fracture with residual deformity. Glenohumeral joint aligned.  IMPRESSION: Acute minimally displaced left distal clavicle fracture.  Osteopenia  Healed proximal left humerus surgical neck fracture with deformity   Electronically Signed   By: Jerilynn Mages.  Shick M.D.   On: 06/13/2014 14:48   Labs: Basic Metabolic Panel:  Recent Labs Lab 06/13/14 1407 06/14/14 0425 06/17/14 0430  NA 141 140 141  K 3.1* 4.0 3.7  CL 100 107 106  CO2 29 27 28   GLUCOSE 102* 91 99  BUN 21 26* 20  CREATININE 0.74 0.82 0.70  CALCIUM 8.5 7.8* 7.8*   CBC:  Recent Labs Lab 06/13/14 1407 06/14/14 0425 06/17/14 0430  WBC 9.2 6.1 8.8  HGB 14.8 12.2 11.6*  HCT 44.8 37.7 36.1  MCV 92.4 93.1 94.3  PLT 218 199 190   CBG:  Recent Labs Lab 06/13/14 1412  GLUCAP 95    Signed:  Barton Dubois  [facility] 06/17/2014, 1:25 PM

## 2014-06-17 NOTE — Progress Notes (Signed)
PT Evaluation GCodes    06/15/14 1133  PT Time Calculation  PT Start Time (ACUTE ONLY) 1046  PT Stop Time (ACUTE ONLY) 1107  PT Time Calculation (min) (ACUTE ONLY) 21 min  PT G-Codes **NOT FOR INPATIENT CLASS**  Functional Assessment Tool Used clinical judgement  Functional Limitation Mobility: Walking and moving around  Mobility: Walking and Moving Around Current Status (F2902) CK  Mobility: Walking and Moving Around Goal Status (X1155) CI  PT General Charges  $$ ACUTE PT VISIT 1 Procedure  PT Evaluation  $Initial PT Evaluation Tier I 1 Procedure   Carmelia Bake, PT, DPT 06/17/2014 Pager: [phone]

## 2014-06-17 NOTE — Progress Notes (Signed)
Report given to Clarise Cruz at [facility]. All questions answered. Pt left with PTAR in NAD.

## 2014-06-17 NOTE — Progress Notes (Signed)
Foley Cath left in place d/t pts late return to unit post operatively. Will pass on to am shift.

## 2014-06-17 NOTE — Progress Notes (Signed)
     Subjective: 1 Day Post-Op Procedure(s) (LRB): TOTAL KNEE ARTHROPLASTY (Left)   Patient reports pain as moderate, pain controlled. No events throughout the night.  Objective:   VITALS:   Filed Vitals:   06/17/14  BP: 124/55  Pulse: 72  Temp: 98.6 F (37 C)   Resp: 17    Dorsiflexion/Plantar flexion intact Incision: dressing C/D/I No cellulitis present Compartment soft  LABS  Recent Labs  06/17/14 0430  HGB 11.6*  HCT 36.1  WBC 8.8  PLT 190     Recent Labs  06/17/14 0430  NA 141  K 3.7  BUN 20  CREATININE 0.70  GLUCOSE 99     Assessment/Plan: 1 Day Post-Op Procedure(s) (LRB): TOTAL KNEE ARTHROPLASTY (Left) Advance diet Up with therapy D/C IV fluids Discharge to SNF when ready medically.  Ortho recommendations:  ASA 325 mg bid for 4 weeks for anticoagulation, unless other medically indicated.  Norco for pain management.  MiraLax and Colace for constipation  Iron 325 mg tid for 2-3 weeks   WBAT on the left leg.  Dressing to remain in place until follow in clinic in 2 weeks.  Dressing is waterproof and may shower with it in place.  Follow up in 2 weeks at [facility]. Follow up with OLIN,Wanita Derenzo D in 2 weeks.  Contact information:  [facility] [address] [place]   PAC  06/17/2014, 10:11 AM

## 2014-06-17 NOTE — Progress Notes (Signed)
Physical Therapy Treatment Patient Details Name: [name] MRN: [id number] DOB: 12/21/1934 Today's Date: 4/6/[name]    History of Present Illness 80 yo female who recently suffered left tibial fracture using KI, sees Dr. Alvan Dame, presented to [facility] ED after an episode of lightheadedness at home with fall hitting L shoulder (per imaging acute minimally displaced left distal clavicle fracture).  Pt currently s/p L TKA 06/16/14.    PT Comments    Pt had L TKA surgery yesterday.  Pt assisted with short distance ambulation as tolerated which was limited by dizziness.  Pt reports she continues to plan on d/c to SNF as she does not feel comfortable going home in current condition.   Follow Up Recommendations  SNF     Equipment Recommendations  None recommended by PT    Recommendations for Other Services       Precautions / Restrictions Precautions Precautions: Fall;Knee Restrictions Weight Bearing Restrictions: No Other Position/Activity Restrictions: WBAT    Mobility  Bed Mobility Overal bed mobility: Needs Assistance Bed Mobility: Sit to Supine       Sit to supine: Min assist;HOB elevated   General bed mobility comments: assist for L LE support, exited bed on pt's Right side  Transfers Overall transfer level: Needs assistance Equipment used: Rolling walker (2 wheeled) Transfers: Sit to/from Stand Sit to Stand: Min assist         General transfer comment: verbal cues for UE and LE positioning, assist to rise and steady as well as control descent  Ambulation/Gait Ambulation/Gait assistance: Min assist Ambulation Distance (Feet): 28 Feet Assistive device: Rolling walker (2 wheeled) Gait Pattern/deviations: Step-to pattern;Antalgic Gait velocity: decr   General Gait Details: verbal cues for safe use of RW, step length, RW positioning, increased L toe out observed, pt reports 5/10 pain in L knee, 6/10 pain in L shoulder at worst, pt reporting dizziness so assisted to  recliner   Stairs            Wheelchair Mobility    Modified Rankin (Stroke Patients Only)       Balance                                    Cognition Arousal/Alertness: Awake/alert Behavior During Therapy: WFL for tasks assessed/performed Overall Cognitive Status: Within Functional Limits for tasks assessed                      Exercises      General Comments        Pertinent Vitals/Pain Pain Assessment: 0-10 Pain Score: 6  Pain Location: L shoulder, 5/10 L knee Pain Descriptors / Indicators: Sore;Aching Pain Intervention(s): Limited activity within patient's tolerance;Monitored during session;Premedicated before session;Repositioned;Ice applied    Home Living                      Prior Function            PT Goals (current goals can now be found in the care plan section) Progress towards PT goals: Progressing toward goals    Frequency  7X/week    PT Plan Current plan remains appropriate    Co-evaluation             End of Session Equipment Utilized During Treatment: Gait belt Activity Tolerance: Patient tolerated treatment well Patient left: with call bell/phone within reach;in chair     Time:  9826-4158 PT Time Calculation (min) (ACUTE ONLY): 16 min  Charges:  $Gait Training: 8-22 mins                    G Codes:      [name],[name], [name], [name] Carmelia Bake, PT, DPT 04-[name]-[name] Pager: [phone]

## 2014-06-17 NOTE — Progress Notes (Signed)
OT Cancellation Note  Patient Details Name: [name] MRN: [id number] DOB: [birth year]   Cancelled Treatment:    Reason Eval/Treat Not Completed: Other (comment) Will defer OT eval to SNF.  [place], [place] 06/17/2014, 11:00 AM

## 2014-06-18 ENCOUNTER — Encounter (HOSPITAL_COMMUNITY): Payer: Self-pay | Admitting: Orthopedic Surgery

## 2014-06-18 NOTE — Op Note (Signed)
[name], [name] NO.:  [id number]  MEDICAL RECORD NO.:  [id number]  LOCATION:  7782                         FACILITY:  [facility]  PHYSICIAN:  [name], M.D.  DATE OF BIRTH:  1935-01-08  DATE OF PROCEDURE:  06/16/2014 DATE OF DISCHARGE:  06/17/2014                              OPERATIVE REPORT   PREOPERATIVE DIAGNOSES:  Advanced left knee degenerative joint disease related to underlying rheumatoid arthritis complicated by recent lateral tibial plateau fracture with split depression type laterally posterolateral fragment.  POSTOPERATIVE DIAGNOSES:  Advanced left knee degenerative joint disease related to underlying rheumatoid arthritis complicated by recent lateral tibial plateau fracture with split depression type laterally posterolateral fragment.  PROCEDURE:  Left total knee arthroplasty utilizing DePuy instrumentation Sigma knee system with a size 3 femur, a size 3 MBT revision tray with a 29 mm cemented sleeve and a 12.5 posterior stabilized insert and a 41 patellar button.  SURGEON:  [name], M.D.  ASSISTANT:  Danae Orleans, PA.  Note that [name] was present for the entirety of the case from preoperative position, perioperative management of the operative extremity, general facilitation of the case with primary wound closure.  ANESTHESIA:  General.  SPECIMENS:  None.  COMPLICATIONS:  None apparent.  TOURNIQUET TIME:  36 minutes at 200 mmHg.  DRAINS:  None.  INDICATIONS FOR PROCEDURE:  [name] is a 80 year old female, who had been seen in the office by myself over the last couple of years with left knee rheumatoid arthritis and advanced degenerative changes.  She presented last week with increasing pain, valgus deformity after aggravation.  Radiographs of the subsequent CT were reviewed in the office.  No advanced degenerative changes with lateral compression.  She was admitted to the hospital after our last office  visit due to syncopal episode sustaining left shoulder and its lateral left shoulder injury. I had seen her in the hospital and discuss with her current predicament. Given the lack of support at home, she was going to require nursing facility recovering no matter what.  After discussing with her the pros and cons of proceeding, I did have physical therapy, assess her ability to bear weight on the left upper extremity.  She was able to tolerate this.  Given these findings, given the predicament in her left knee, we discussed the risks and benefits of surgery, including infection, DVT, component of failure.  She had been off her Humira medication for 2 weeks, but still remain on prednisone.  She wished at this point to proceed, understanding the risks, as well as the current predicament involving ipsilateral left shoulder, consent was obtained.  PROCEDURE IN DETAIL:  The patient was brought to the operative theater. Once adequate anesthesia, preoperative antibiotics administered, she was positioned supine with a left thigh tourniquet placed.  The left lower extremity was then prepped and draped in sterile fashion, placed in a DeMayo leg holder.  Time-out was performed identifying the patient, planned procedure, and extremity.  She was noted to have significant pseudolaxity due to the valgus compression of the lateral compartment, but she was passively correctable.  She has a slight flexion contracture.  The left lower extremity was  exsanguinated.  Tourniquet elevated to 200 mmHg.  Midline incision was made followed by median arthrotomy with soft tissue exposure.  Median arthrotomy was then made with complete synovectomy of her rheumatoid joint.  Following this, attention was directed to the patella, precut measurement was noted to be about 20 mm with significant degenerative changes noted.  I resected down to about 14 mm and used a 41 patellar button to cover the surface as well as  restore patellar height.  Attention was now directed to the femur.  Femoral canal was opened with a drill, irrigated to try to prevent fat emboli.  An intramedullary rod was passed and at 3 degrees of valgus, I resected 10 mm of bone.  This resection did remove bone off the distal lateral femur.  Following this, the tibia was subluxated anteriorly.  Further meniscectomies and cruciate debridements carried out as necessary. Using extramedullary guide, I elected at this point to resect 10 mm based upon the proximal medial tibia.  Following this resection, I did remove the vast majority of the degenerative changes laterally as well as the compression segment to be relatively cut surface of the posterolateral segment was identified as I reviewed the CT scan.  I felt in the proximal tibiofibular joint that this should remain intact and thus, I did not remove this.  I did however select these and MBT tray to bypass the support in the proximal tibia just in case, but also used a 29 cemented sleeve for rotational support based on bone quality as well as this fracture to provide extra stability.  The proximal tibia was sized to a size 3.  The size 3 tray was pinned into position, drilled for an MBT tray followed by preparation of the 29 broach for cemented sleeve.  The trial tibia was placed.  Following this preparation, we assessed the extension gap noting that at least a 10 mm insert allowed for full knee extension.  This also corrected her valgus.  At this point, I sized the femur to be a size 3.  The size 3 rotational block was then pinned into position based upon the C-clamp of the proximal tibia cut, which had been confirmed to be perpendicular in the coronal plane.  The anterior, posterior, and chamfer cuts were all made without difficulty nor notching.  Final box cut was made off the lateral aspect of the distal femur for a size 3 femur.  A trial reduction now carried out with a 3  femur, 3 tibia, MBT tray with a 29 cemented sleeve. Trial reduction was carried out and I felt with a 12.5 insert, the knee was stable from extension to flexion and was balanced.  The knee came to full extension.  The patella tracked through the trochlea without application of pressure.  Given these findings, the trial components were all removed.  The synovial capsule junction of the knee was injected with 30 mL of 0.25% Marcaine with epinephrine, 1 mL of Toradol, and 30 mL of normal saline to the synovial capsule junction.  The knee was then irrigated with normal saline solution.  Final components were opened and configured under my direct supervision on the back table.  The cement was mixed. Final components were then cemented into placed.  The knee was re- irrigated.  The tourniquet was let down and there was no significant hemostasis, required slight synovectomy.  Once the final cement had cured and excess cement was removed throughout the knee, the final 12.5 posterior stabilized insert was  placed into the tibia and the knee reduced.  We re-irrigated the knee.  I then reapproximated the extensor mechanism using #1 Vicryl and 0 V-Loc suture.  The remainder of wound was closed with 2-0 Vicryl and running 4-0 Monocryl.  The knee was cleaned, dried, dressed sterilely using the surgical glue and Aquacel dressing.  The knee was wrapped in Ace wrap.  She was brought to the recovery room in stable condition, tolerating the procedure well. Findings were reviewed with her husband over the phone.     [name], M.D.     MDO/MEDQ  D:  06/17/2014  T:  06/18/2014  Job:  [id number]

## 2014-06-18 NOTE — Progress Notes (Signed)
Clinical Social Work Department CLINICAL SOCIAL WORK PLACEMENT NOTE 06/18/2014  Patient:  [name], [name]  Account Number:  [id number] Admit date:  06/13/2014  Clinical Social Worker:  Dede Query, CLINICAL SOCIAL WORKER  Date/time:  06/14/2014 11:57 AM  Clinical Social Work is seeking post-discharge placement for this patient at the following level of care:   [place]   (*CSW will update this form in Epic as items are completed)   06/14/2014  Patient/family provided with [facility] Department of Clinical Social Work's list of facilities offering this level of care within the geographic area requested by the patient (or if unable, by the patient's family).  06/14/2014  Patient/family informed of their freedom to choose among providers that offer the needed level of care, that participate in Medicare, Medicaid or managed care program needed by the patient, have an available bed and are willing to accept the patient.  06/14/2014  Patient/family informed of [facility]' ownership interest in [facility], as well as of the fact that they are under no obligation to receive care at this facility.  PASARR submitted to EDS on 06/14/2014 PASARR number received on 06/14/2014  FL2 transmitted to all facilities in geographic area requested by pt/family on  06/14/2014 FL2 transmitted to all facilities within larger geographic area on   Patient informed that his/her managed care company has contracts with or will negotiate with  certain facilities, including the following:     Patient/family informed of bed offers received:   Patient chooses bed at [facility], [place] Physician recommends and patient chooses bed at    Patient to be transferred to [facility] on  06/17/2014 Patient to be transferred to facility by [facility] Patient and family notified of transfer on 06/17/2014 Name of family member notified:  SPOUSE  The following  physician request were entered in Epic:   Additional Comments: Pt/ spouse are in agreement with d/c to SNF today. [facility] transport is required. Pt is aware out of pocket cost may be associated with [facility] transport. NSG reviewed d/c summary, scripts, avs. Scripts included in d/c packet.  CSW faxed corrected med necessity form to Tammy at [facility] per her instructions.  Werner Lean LCSW [phone]

## 2014-06-26 ENCOUNTER — Ambulatory Visit: Payer: 59 | Admitting: Internal Medicine

## 2014-07-21 ENCOUNTER — Other Ambulatory Visit: Payer: Self-pay | Admitting: Nurse Practitioner

## 2014-07-25 ENCOUNTER — Other Ambulatory Visit: Payer: Self-pay | Admitting: Internal Medicine

## 2014-08-05 ENCOUNTER — Other Ambulatory Visit: Payer: Self-pay | Admitting: Internal Medicine

## 2014-08-14 ENCOUNTER — Ambulatory Visit: Payer: Medicare Other | Admitting: Internal Medicine

## 2014-08-16 ENCOUNTER — Encounter (HOSPITAL_COMMUNITY): Payer: Self-pay | Admitting: *Deleted

## 2014-08-16 ENCOUNTER — Emergency Department (HOSPITAL_COMMUNITY)
Admission: EM | Admit: 2014-08-16 | Discharge: 2014-08-16 | Disposition: A | Discharge status: Home / Self Care | Payer: Medicare Other | Attending: Emergency Medicine | Admitting: Emergency Medicine

## 2014-08-16 DIAGNOSIS — H409 Unspecified glaucoma: Secondary | ICD-10-CM | POA: Insufficient documentation

## 2014-08-16 DIAGNOSIS — M069 Rheumatoid arthritis, unspecified: Secondary | ICD-10-CM | POA: Insufficient documentation

## 2014-08-16 DIAGNOSIS — S81802A Unspecified open wound, left lower leg, initial encounter: Secondary | ICD-10-CM | POA: Insufficient documentation

## 2014-08-16 DIAGNOSIS — E785 Hyperlipidemia, unspecified: Secondary | ICD-10-CM | POA: Insufficient documentation

## 2014-08-16 DIAGNOSIS — G8929 Other chronic pain: Secondary | ICD-10-CM | POA: Diagnosis not present

## 2014-08-16 DIAGNOSIS — Z791 Long term (current) use of non-steroidal anti-inflammatories (NSAID): Secondary | ICD-10-CM | POA: Diagnosis not present

## 2014-08-16 DIAGNOSIS — Z8701 Personal history of pneumonia (recurrent): Secondary | ICD-10-CM | POA: Diagnosis not present

## 2014-08-16 DIAGNOSIS — I10 Essential (primary) hypertension: Secondary | ICD-10-CM | POA: Diagnosis not present

## 2014-08-16 DIAGNOSIS — Z23 Encounter for immunization: Secondary | ICD-10-CM | POA: Diagnosis not present

## 2014-08-16 DIAGNOSIS — Z96652 Presence of left artificial knee joint: Secondary | ICD-10-CM | POA: Insufficient documentation

## 2014-08-16 DIAGNOSIS — Z79899 Other long term (current) drug therapy: Secondary | ICD-10-CM | POA: Insufficient documentation

## 2014-08-16 DIAGNOSIS — Z8709 Personal history of other diseases of the respiratory system: Secondary | ICD-10-CM | POA: Diagnosis not present

## 2014-08-16 DIAGNOSIS — R600 Localized edema: Secondary | ICD-10-CM | POA: Insufficient documentation

## 2014-08-16 DIAGNOSIS — Y998 Other external cause status: Secondary | ICD-10-CM | POA: Insufficient documentation

## 2014-08-16 DIAGNOSIS — S81822A Laceration with foreign body, left lower leg, initial encounter: Secondary | ICD-10-CM | POA: Insufficient documentation

## 2014-08-16 DIAGNOSIS — M810 Age-related osteoporosis without current pathological fracture: Secondary | ICD-10-CM | POA: Insufficient documentation

## 2014-08-16 DIAGNOSIS — R609 Edema, unspecified: Secondary | ICD-10-CM

## 2014-08-16 DIAGNOSIS — E039 Hypothyroidism, unspecified: Secondary | ICD-10-CM | POA: Insufficient documentation

## 2014-08-16 DIAGNOSIS — F419 Anxiety disorder, unspecified: Secondary | ICD-10-CM | POA: Diagnosis not present

## 2014-08-16 DIAGNOSIS — Y9389 Activity, other specified: Secondary | ICD-10-CM | POA: Diagnosis not present

## 2014-08-16 DIAGNOSIS — S8992XA Unspecified injury of left lower leg, initial encounter: Secondary | ICD-10-CM | POA: Diagnosis present

## 2014-08-16 DIAGNOSIS — Z8719 Personal history of other diseases of the digestive system: Secondary | ICD-10-CM | POA: Insufficient documentation

## 2014-08-16 DIAGNOSIS — W228XXA Striking against or struck by other objects, initial encounter: Secondary | ICD-10-CM | POA: Insufficient documentation

## 2014-08-16 DIAGNOSIS — S81812A Laceration without foreign body, left lower leg, initial encounter: Secondary | ICD-10-CM

## 2014-08-16 DIAGNOSIS — Y9289 Other specified places as the place of occurrence of the external cause: Secondary | ICD-10-CM | POA: Insufficient documentation

## 2014-08-16 DIAGNOSIS — Z7982 Long term (current) use of aspirin: Secondary | ICD-10-CM | POA: Insufficient documentation

## 2014-08-16 MED ORDER — TETANUS-DIPHTH-ACELL PERTUSSIS 5-2.5-18.5 LF-MCG/0.5 IM SUSP
0.5000 mL | Freq: Once | INTRAMUSCULAR | Status: AC
  Administered 2014-08-16: 0.5 mL via INTRAMUSCULAR
  Filled 2014-08-16: qty 0.5

## 2014-08-16 NOTE — Discharge Instructions (Signed)
1. Medications: usual home medications 2. Treatment: rest, drink plenty of fluids, keep wound clean and dry; use compression stockings 3. Follow Up: Please followup with your primary doctor in 3 days for wound check; please follow-up with the wound care center at her already scheduled appointment; Please return to the ER for symptoms of infection, fevers, nausea vomiting or other concerns

## 2014-08-16 NOTE — ED Notes (Signed)
Ordered stockings med/large

## 2014-08-16 NOTE — ED Notes (Signed)
Pt statse that she hit her leg this morning. Pt states that she initially had bleeding from the site on lower leg. Pt now has "fluid" draining from the site. Pt states that she has knee surgery on the same leg and states that she has "been retaining fluid" on the leg since.

## 2014-08-16 NOTE — ED Provider Notes (Signed)
CSN: [id number]     Arrival date & time 08/16/14  1526 History   First MD Initiated Contact with Patient 08/16/14 1808     Chief Complaint  Patient presents with  . Leg Injury     (Consider location/radiation/quality/duration/timing/severity/associated sxs/prior Treatment) The history is provided by the patient and medical records. No language interpreter was used.     [name] is a 80 y.o. female  with a hx of ulcerative colitis, HTN, chronic bronchitis, recurrent skin tears, chronic back pain, glaucome presents to the Emergency Department complaining of acute, persistent, left anterior lower leg laceration onset this morning when she bumped it on the corner of the dryer.  Pt reports it immediately began to bleed.  She reports that after the bleeding stopped it continued to weep clear fluid.  She reports she had a left knee replacement in April 2016 and afterward she has had persistent edema in the lower legs, L>R.  Pt reports due to this she has slow healing wounds.  She is scheduled to see the wound clinic on 09/02/14.  Her wounds have been treated by Dr. Alvan Dame up to this point and she reports they are healing, but slowly.  She denies pain at the site of the laceration, fever, chills, nausea or vomiting.  She did not fall or hit her head. Pt reports bandaging the wound without cessation of the oozing.    Past Medical History  Diagnosis Date  . Ulcerative colitis   . Hypothyroidism   . Hypertension     pcp   dr reed   [facility]  . Pneumonia 2008  . Chronic bronchitis 10/31/2011    "prone to it; I have sinus/bronchitis problem probably q yr"  . Multiple skin tears 10/31/2011    "get them very easily; my skin is thin and I bruise easily"  . Chronic back pain     "my entire spine"  . Edema   . Other and unspecified hyperlipidemia   . Encounter for long-term (current) use of other medications   . Senile osteoporosis   . Spinal stenosis, unspecified region other than cervical    . Unspecified glaucoma   . Hypothyroidism   . Complication of anesthesia 2009    htn with block- for hand surgery   . Anxiety     sometimes   . Rheumatoid arthritis(714.0)     degenerative disc disease    Past Surgical History  Procedure Laterality Date  . Back surgery    . Sinus surgery with instatrak    . Thyroidectomy, partial  1997  . Posterior fusion lumbar spine  10/31/2011    L2-3; 3-4  . Tonsillectomy      "when I was a small child"  . Functional endoscopic sinus surgery  1988  . Posterior fusion lumbar spine  11/2009    L3-4;  L4-5  . Joint replacement  2009; 2006    joints 2 fingers,rt hand; joint left thumb  . Spine surgery    . Posterior lumbar fusion N/A 08/05/13    T1, T2  . Eye surgery    . Anterior cervical decomp/discectomy fusion N/A 02/17/2014    Procedure: CERVICAL FOUR TO FIVE ANTERIOR CERVICAL DECOMPRESSION/DISCECTOMY FUSION 1 LEVEL;  Surgeon: [name], MD;  Location: [facility];  Service: Neurosurgery;  Laterality: N/A;  C4-5 Anterior cervical decompression/diskectomy/fusion  . Cataract extraction Left 11/11/2013  . Cataract extraction Right 12/30/2013  . Total knee arthroplasty Left 06/16/2014    Procedure: TOTAL  KNEE ARTHROPLASTY;  Surgeon: Paralee Cancel, MD;  Location: [facility];  Service: Orthopedics;  Laterality: Left;   Family History  Problem Relation Age of Onset  . Heart disease Mother   . Cancer Father     lung   History  Substance Use Topics  . Smoking status: Passive Smoke Exposure - Never Smoker -- 0.25 packs/day for 50 years    Types: Cigarettes  . Smokeless tobacco: Current User     Comment: 10/31/2011  offered smoking cessation materials; pt declines; "I puff; I've never really inhaled"  . Alcohol Use: 2.4 oz/week    2 Glasses of wine, 2 Cans of beer per week     Comment: 10/31/2011 "1 beers/wine with dinner daily ; an occasional scotch"   OB History    No data available     Review of Systems  Constitutional: Negative for fever  and chills.  Gastrointestinal: Negative for nausea and vomiting.  Endocrine: Negative for polydipsia, polyphagia and polyuria.  Skin: Positive for wound.  Allergic/Immunologic: Negative for immunocompromised state.  Hematological: Does not bruise/bleed easily.  Psychiatric/Behavioral: The patient is not nervous/anxious.       Allergies  Review of patient's allergies indicates no known allergies.  Home Medications   Prior to Admission medications   Medication Sig Start Date End Date Taking? Authorizing Provider  adalimumab (HUMIRA) 40 MG/0.8ML injection Inject 0.8 mLs (40 mg total) into the skin every 14 (fourteen) days. Resume ONLY if off all antibiotics 11/24/11  Yes Shanker Kristeen Mans, MD  aspirin EC 81 MG tablet Take 1 tablet (81 mg total) by mouth every other day. Hold for 2 weeks while using full dose ASA BID as DVT prophylaxis Patient taking differently: Take 81 mg by mouth every other day. Take 1 tablet daily per patient 06/17/14  Yes Barton Dubois, MD  balsalazide (COLAZAL) 750 MG capsule Take 2,250 mg by mouth 2 (two) times daily.    Yes Historical Provider, MD  Calcium Carb-Cholecalciferol (CALCIUM + D3) 600-200 MG-UNIT TABS Take 1 tablet by mouth daily.   Yes Historical Provider, MD  CELEBREX 200 MG capsule Take 200 mg by mouth 2 (two) times daily.  07/15/12  Yes Historical Provider, MD  Cholecalciferol (VITAMIN D3) 1000 UNITS CAPS Take 1,000 Units by mouth daily.   Yes Historical Provider, MD  diazepam (VALIUM) 5 MG tablet Take 1 tablet (5 mg total) by mouth every 6 (six) hours as needed for muscle spasms. 06/17/14  Yes Barton Dubois, MD  estradiol (ESTRACE) 0.5 MG tablet Take 0.5 mg by mouth daily.  06/18/12  Yes Historical Provider, MD  ferrous sulfate 325 (65 FE) MG tablet Take 1 tablet (325 mg total) by mouth 3 (three) times daily after meals. 06/17/14  Yes Barton Dubois, MD  folic acid (FOLVITE) 1 MG tablet Take 2 mg by mouth daily.   Yes Historical Provider, MD  furosemide (LASIX)  20 MG tablet 1 BY MOUTH DAILY AS NEEDED 08/05/14  Yes Estill Dooms, MD  guaiFENesin (MUCINEX) 600 MG 12 hr tablet Take 1,200 mg by mouth daily as needed (sinus relief).    Yes Historical Provider, MD  hydrochlorothiazide (HYDRODIURIL) 25 MG tablet TAKE 1 TABLET (25 MG TOTAL) BY MOUTH DAILY. 07/21/14  Yes [name] L [name], DO  HYDROcodone-acetaminophen (NORCO/VICODIN) 5-325 MG per tablet Take 1 tablet by mouth every 6 (six) hours as needed for moderate pain (pain). 06/17/14  Yes Barton Dubois, MD  latanoprost (XALATAN) 0.005 % ophthalmic solution Place 1 drop into both eyes  at bedtime.   Yes Historical Provider, MD  leflunomide (ARAVA) 20 MG tablet Take 20 mg by mouth daily. 05/07/14  Yes Historical Provider, MD  levothyroxine (SYNTHROID) 88 MCG tablet Take 88 mcg by mouth daily before breakfast. DAW, DO NOT SUBSTITUTE WITH GENERIC   Yes Historical Provider, MD  medroxyPROGESTERone (PROVERA) 2.5 MG tablet Take 2.5 mg by mouth at bedtime.  06/02/13  Yes Historical Provider, MD  Multiple Vitamin (MULTIVITAMIN WITH MINERALS) TABS Take 1 tablet by mouth daily.   Yes Historical Provider, MD  Omega-3 Fatty Acids (FISH OIL) 1200 MG CAPS Take 1,200 mg by mouth daily.   Yes Historical Provider, MD  oxyCODONE-acetaminophen (PERCOCET/ROXICET) 5-325 MG per tablet Take 1-2 tablets by mouth every 8 (eight) hours as needed for severe pain. 06/17/14  Yes Barton Dubois, MD  potassium chloride SA (K-DUR,KLOR-CON) 20 MEQ tablet Take 20 mEq by mouth daily.   Yes Historical Provider, MD  predniSONE (DELTASONE) 5 MG tablet Take 5 mg by mouth daily with breakfast.    Yes Historical Provider, MD  aspirin EC 325 MG EC tablet Take 1 tablet (325 mg total) by mouth 2 (two) times daily. Patient not taking: Reported on 08/16/2014 06/17/14   Barton Dubois, MD  bisacodyl (DULCOLAX) 10 MG suppository Place 1 suppository (10 mg total) rectally daily as needed for moderate constipation. Patient not taking: Reported on 08/16/2014 06/17/14   Barton Dubois, MD  docusate sodium (COLACE) 100 MG capsule Take 1 capsule (100 mg total) by mouth 2 (two) times daily. Patient not taking: Reported on 08/16/2014 06/17/14   Barton Dubois, MD  polyethylene glycol [facility] Floria Raveling) packet Take 17 g by mouth 2 (two) times daily. Patient not taking: Reported on 08/16/2014 06/17/14   Barton Dubois, MD  saccharomyces boulardii (FLORASTOR) 250 MG capsule Take 1 capsule (250 mg total) by mouth 2 (two) times daily. Patient not taking: Reported on 06/13/2014 06/10/14   [name] L [name], DO   BP 167/78 mmHg  Pulse 61  Temp(Src) 99 F (37.2 C) (Oral)  Resp 18  Ht 5\' 7"  (1.702 m)  Wt 120 lb (54.432 kg)  BMI 18.79 kg/m2  SpO2 100% Physical Exam  Constitutional: She is oriented to person, place, and time. She appears well-developed and well-nourished. No distress.  HENT:  Head: Normocephalic and atraumatic.  Eyes: Conjunctivae are normal. No scleral icterus.  Neck: Normal range of motion.  Cardiovascular: Normal rate, regular rhythm, normal heart sounds and intact distal pulses.   No murmur heard. Capillary refill < 3 sec  Pulmonary/Chest: Effort normal and breath sounds normal. No respiratory distress.  Musculoskeletal: Normal range of motion. She exhibits no edema.  ROM: FROM of the BLE  Neurological: She is alert and oriented to person, place, and time.  Sensation: intact to dull and sharp Strength: 5/5 in the BLE including   Skin: Skin is warm and dry. She is not diaphoretic.  3cm V shaped skin tear to the anterior, slightly medical, central lower leg; hemostasis achieved but persistent clear fluid drains from the site Just lateral to the laceration is a small circular chronic wound without erythema or induration Right central, medial lower leg with chronic wound without erythema or induration  Psychiatric: She has a normal mood and affect.  Nursing note and vitals reviewed.   ED Course  Procedures (including critical care time) Labs Review Labs  Reviewed - No data to display  Imaging Review No results found.   EKG Interpretation None  MDM   Final diagnoses:  Skin tear of lower leg without complication, left, initial encounter  Peripheral edema   ELLOWYN RIEVES presents with skin tear to the left lower leg.  No signs of infection.  Wound is not deep enough for sutures.  Clear serous fluid is seeping from the wound.  Edema appears chronic and I doubt DVT at this time.    Wound cleaned.  Compression dressing placed.  Will place in TED hose.  She is to elevate her legs at home and see wound care at her scheduled appointment.  Tdap updated. Will hold on antibiotics and have watchful waiting.  Wound care at home discussed.  BP 167/78 mmHg  Pulse 61  Temp(Src) 99 F (37.2 C) (Oral)  Resp 18  Ht 5\' 7"  (1.702 m)  Wt 120 lb (54.432 kg)  BMI 18.79 kg/m2  SpO2 100%   The patient was discussed with and seen by Dr. Darl Householder who agrees with the treatment plan.    Jarrett Soho Abhiraj Dozal, PA-C 08/16/14 Bethany Yao, MD 08/17/14 [phone]

## 2014-08-18 ENCOUNTER — Encounter: Payer: Self-pay | Admitting: Podiatry

## 2014-08-18 ENCOUNTER — Ambulatory Visit (INDEPENDENT_AMBULATORY_CARE_PROVIDER_SITE_OTHER): Payer: Medicare Other | Admitting: Podiatry

## 2014-08-18 DIAGNOSIS — B351 Tinea unguium: Secondary | ICD-10-CM

## 2014-08-18 NOTE — Progress Notes (Signed)
Patient ID: [name], female   DOB: 05-15-1934, 80 y.o.   MRN: [id number]  Subjective: Patient presents today requesting debridement of toenails She relates a history of edema left lower extremity which is been treated in ER with compression dressings  Objective: Compression dressing left lower extremity The toenails are elongated, brittle, discolored 6-10  Assessment: Mycotic toenails 6-10  Plan: Debridement toenails 10 without a bleeding  Reappoint when necessary at patient's request

## 2014-08-19 ENCOUNTER — Ambulatory Visit (INDEPENDENT_AMBULATORY_CARE_PROVIDER_SITE_OTHER): Payer: Medicare Other | Admitting: Internal Medicine

## 2014-08-19 ENCOUNTER — Encounter: Payer: Self-pay | Admitting: Internal Medicine

## 2014-08-19 VITALS — BP 120/70 | HR 71 | Temp 98.4°F | Resp 18 | Ht 67.0 in | Wt 133.0 lb

## 2014-08-19 DIAGNOSIS — S81809D Unspecified open wound, unspecified lower leg, subsequent encounter: Secondary | ICD-10-CM

## 2014-08-19 DIAGNOSIS — S81809A Unspecified open wound, unspecified lower leg, initial encounter: Secondary | ICD-10-CM

## 2014-08-19 DIAGNOSIS — S91009D Unspecified open wound, unspecified ankle, subsequent encounter: Secondary | ICD-10-CM | POA: Diagnosis not present

## 2014-08-19 DIAGNOSIS — S81009D Unspecified open wound, unspecified knee, subsequent encounter: Secondary | ICD-10-CM | POA: Diagnosis not present

## 2014-08-19 DIAGNOSIS — S81009A Unspecified open wound, unspecified knee, initial encounter: Secondary | ICD-10-CM | POA: Insufficient documentation

## 2014-08-19 DIAGNOSIS — S91009A Unspecified open wound, unspecified ankle, initial encounter: Secondary | ICD-10-CM

## 2014-08-19 MED ORDER — SILVER SULFADIAZINE 1 % EX CREA: 1.0000 "application " | TOPICAL_CREAM | Freq: Every day | CUTANEOUS | Status: DC

## 2014-08-19 NOTE — Progress Notes (Signed)
Patient ID: [name], female   DOB: [birth year], 80 y.o.   MRN: [id number]    Facility  [facility]    Place of Service:   OFFICE    No Known Allergies  Chief Complaint  Patient presents with  . Acute Visit    Ongoing bilateral fluid in legs, left is worse.   . Medication Management    Discuss the need for iron and zinc supplement.   . Wound Check    Examine wound on left leg, discuss if daily dressing is needed     HPI:  Patient is here for an acute visit and follow-up of a trip to the emergency room for wound care.  Patient fell and broke her left Clavicle while recovering and collapse nursing facility for 6 weeks. She had increasing edema of the legs while there but there wasn't any concern about that per the patient. She bumped her left leg following discharge from the facility. Her legs have been swollen and the CPH was at such a volume that she went to the emergency room 08/16/2014. They dressed her at the emergency room with bandages and applied a compression wrap very tightly. Last night the dressing seems to tight that it was creating pain. She could not sleep so she loosened the bandage. She is here 3 days following her emergency room visit for follow-up as was suggested by the ED.  Since loosening the bandage last night her leg is feeling a little bit better. Swelling has not increased any of the leg where the compression bandage was applied, but she continues to have swelling of the foot.  In addition to the wound on the left leg, there is an older wound of the right shin area medially. This is an area that was injured while she was at collapse nursing facility. The skin was torn on a foot rest of a wheelchair.  Medications: Patient's Medications  New Prescriptions   No medications on file  Previous Medications   ADALIMUMAB (HUMIRA) 40 MG/0.8ML INJECTION    Inject 0.8 mLs (40 mg total) into the skin every 14 (fourteen) days. Resume ONLY if off all antibiotics   BALSALAZIDE (COLAZAL) 750 MG CAPSULE    Take 2,250 mg by mouth 2 (two) times daily.    CALCIUM CARB-CHOLECALCIFEROL (CALCIUM + D3) 600-200 MG-UNIT TABS    Take 1 tablet by mouth daily.   CELEBREX 200 MG CAPSULE    Take 200 mg by mouth 2 (two) times daily.    CHOLECALCIFEROL (VITAMIN D3) 1000 UNITS CAPS    Take 1,000 Units by mouth daily.   DIAZEPAM (VALIUM) 5 MG TABLET    Take 1 tablet (5 mg total) by mouth every 6 (six) hours as needed for muscle spasms.   ESTRADIOL (ESTRACE) 0.5 MG TABLET    Take 0.5 mg by mouth daily.    FOLIC ACID (FOLVITE) 1 MG TABLET    Take 2 mg by mouth daily.   FUROSEMIDE (LASIX) 20 MG TABLET    1 BY MOUTH DAILY AS NEEDED   GUAIFENESIN (MUCINEX) 600 MG 12 HR TABLET    Take 1,200 mg by mouth daily as needed (sinus relief).    HUMIRA PEN 40 MG/0.8ML PNKT       HYDROCHLOROTHIAZIDE (HYDRODIURIL) 25 MG TABLET    TAKE 1 TABLET (25 MG TOTAL) BY MOUTH DAILY.   HYDROCODONE-ACETAMINOPHEN (NORCO/VICODIN) 5-325 MG PER TABLET    Take 1 tablet by mouth every 6 (six) hours as needed for moderate pain (  pain).   LATANOPROST (XALATAN) 0.005 % OPHTHALMIC SOLUTION    Place 1 drop into both eyes at bedtime.   LEFLUNOMIDE (ARAVA) 20 MG TABLET    Take 20 mg by mouth daily.   LEVOTHYROXINE (SYNTHROID) 88 MCG TABLET    Take 88 mcg by mouth daily before breakfast. DAW, DO NOT SUBSTITUTE WITH GENERIC   MEDROXYPROGESTERONE (PROVERA) 2.5 MG TABLET    Take 2.5 mg by mouth at bedtime.    MULTIPLE VITAMIN (MULTIVITAMIN WITH MINERALS) TABS    Take 1 tablet by mouth daily.   OMEGA-3 FATTY ACIDS (FISH OIL) 1200 MG CAPS    Take 1,200 mg by mouth daily.   OXYCODONE-ACETAMINOPHEN (PERCOCET/ROXICET) 5-325 MG PER TABLET    Take 1-2 tablets by mouth every 8 (eight) hours as needed for severe pain.   POTASSIUM CHLORIDE SA (K-DUR,KLOR-CON) 20 MEQ TABLET    Take 20 mEq by mouth daily.   PREDNISONE (DELTASONE) 5 MG TABLET    Take 5 mg by mouth daily with breakfast.   Modified Medications   Modified Medication  Previous Medication   ASPIRIN EC 81 MG TABLET aspirin EC 81 MG tablet      Take 1 tablet (81 mg total) by mouth once.    Take 1 tablet (81 mg total) by mouth every other day. Hold for 2 weeks while using full dose ASA BID as DVT prophylaxis   FERROUS SULFATE 325 (65 FE) MG TABLET ferrous sulfate 325 (65 FE) MG tablet      Take 1 tablet (325 mg total) by mouth daily.    Take 1 tablet (325 mg total) by mouth 3 (three) times daily after meals.  Discontinued Medications   ASPIRIN EC 325 MG EC TABLET    Take 1 tablet (325 mg total) by mouth 2 (two) times daily.   BISACODYL (DULCOLAX) 10 MG SUPPOSITORY    Place 1 suppository (10 mg total) rectally daily as needed for moderate constipation.   DOCUSATE SODIUM (COLACE) 100 MG CAPSULE    Take 1 capsule (100 mg total) by mouth 2 (two) times daily.   POLYETHYLENE GLYCOL (MIRALAX / GLYCOLAX) PACKET    Take 17 g by mouth 2 (two) times daily.   SACCHAROMYCES BOULARDII (FLORASTOR) 250 MG CAPSULE    Take 1 capsule (250 mg total) by mouth 2 (two) times daily.     Review of Systems  Constitutional:       Frail elderly  Respiratory: Positive for shortness of breath. Negative for cough and wheezing.   Cardiovascular: Positive for leg swelling. Negative for chest pain and palpitations.  Gastrointestinal: Negative for constipation.  Musculoskeletal:       Neck pain, back pain better;  Now knee pain on left  Skin:       Once present on both the right shin medially and left lower leg.  Neurological: Positive for weakness.  Hematological: Bruises/bleeds easily.    Filed Vitals:   08/19/14 1337  BP: 120/70  Pulse: 71  Temp: 98.4 F (36.9 C)  TempSrc: Oral  Resp: 18  Height: _0  (1.702 m)  Weight: 133 lb (60.328 kg)  SpO2: 95%   Body mass index is 20.83 kg/(m^2).  Physical Exam  Constitutional: She is oriented to person, place, and time.  Thin white female  Cardiovascular: Normal rate, regular rhythm, normal heart sounds and intact distal pulses.    Pulmonary/Chest: Effort normal and breath sounds normal. No respiratory distress.  Abdominal: Bowel sounds are normal.  Musculoskeletal: She exhibits edema.  Left foot, ankle and lower leg to shin with 1+ pitting edema;  Bilateral chronic venous stasis changes present;  Edema of left knee; varicosities present   Neurological: She is alert and oriented to person, place, and time. No cranial nerve deficit.  Skin: Skin is warm and dry.  Approximately 1.25 inch wound of the right shin that is scooped out and has a yellow pus on top of it. There is some mild surrounding erythema. Left leg has pressure wounds on the shin and medially.  Psychiatric: She has a normal mood and affect.     Labs reviewed: Admission on 06/13/2014, Discharged on 06/17/2014  Component Date Value Ref Range Status  . Glucose-Capillary 06/13/2014 95  70 - 99 mg/dL Final  . WBC 06/13/2014 9.2  4.0 - 10.5 K/uL Final  . RBC 06/13/2014 4.85  3.87 - 5.11 MIL/uL Final  . Hemoglobin 06/13/2014 14.8  12.0 - 15.0 g/dL Final  . HCT 06/13/2014 44.8  36.0 - 46.0 % Final  . MCV 06/13/2014 92.4  78.0 - 100.0 fL Final  . MCH 06/13/2014 30.5  26.0 - 34.0 pg Final  . MCHC 06/13/2014 33.0  30.0 - 36.0 g/dL Final  . RDW 06/13/2014 14.4  11.5 - 15.5 % Final  . Platelets 06/13/2014 218  150 - 400 K/uL Final  . Sodium 06/13/2014 141  135 - 145 mmol/L Final  . Potassium 06/13/2014 3.1* 3.5 - 5.1 mmol/L Final  . Chloride 06/13/2014 100  96 - 112 mmol/L Final  . CO2 06/13/2014 29  19 - 32 mmol/L Final  . Glucose, Bld 06/13/2014 102* 70 - 99 mg/dL Final  . BUN 06/13/2014 21  6 - 23 mg/dL Final  . Creatinine, Ser 06/13/2014 0.74  0.50 - 1.10 mg/dL Final  . Calcium 06/13/2014 8.5  8.4 - 10.5 mg/dL Final  . GFR calc non Af Amer 06/13/2014 80* >90 mL/min Final  . GFR calc Af Amer 06/13/2014 >90  >90 mL/min Final   Comment: (NOTE) The eGFR has been calculated using the CKD EPI equation. This calculation has not been validated in all clinical  situations. eGFR's persistently <90 mL/min signify possible Chronic Kidney Disease.   . Anion gap 06/13/2014 12  5 - 15 Final  . Sodium 06/14/2014 140  135 - 145 mmol/L Final  . Potassium 06/14/2014 4.0  3.5 - 5.1 mmol/L Final   Comment: DELTA CHECK NOTED SLIGHT HEMOLYSIS HEMOLYSIS AT THIS LEVEL MAY AFFECT RESULT   . Chloride 06/14/2014 107  96 - 112 mmol/L Final  . CO2 06/14/2014 27  19 - 32 mmol/L Final  . Glucose, Bld 06/14/2014 91  70 - 99 mg/dL Final  . BUN 06/14/2014 26* 6 - 23 mg/dL Final  . Creatinine, Ser 06/14/2014 0.82  0.50 - 1.10 mg/dL Final  . Calcium 06/14/2014 7.8* 8.4 - 10.5 mg/dL Final  . GFR calc non Af Amer 06/14/2014 67* >90 mL/min Final  . GFR calc Af Amer 06/14/2014 78* >90 mL/min Final   Comment: (NOTE) The eGFR has been calculated using the CKD EPI equation. This calculation has not been validated in all clinical situations. eGFR's persistently <90 mL/min signify possible Chronic Kidney Disease.   . Anion gap 06/14/2014 6  5 - 15 Final  . WBC 06/14/2014 6.1  4.0 - 10.5 K/uL Final  . RBC 06/14/2014 4.05  3.87 - 5.11 MIL/uL Final  . Hemoglobin 06/14/2014 12.2  12.0 - 15.0 g/dL Final   Comment: DELTA CHECK NOTED REPEATED TO VERIFY   .  HCT 06/14/2014 37.7  36.0 - 46.0 % Final  . MCV 06/14/2014 93.1  78.0 - 100.0 fL Final  . MCH 06/14/2014 30.1  26.0 - 34.0 pg Final  . MCHC 06/14/2014 32.4  30.0 - 36.0 g/dL Final  . RDW 06/14/2014 14.7  11.5 - 15.5 % Final  . Platelets 06/14/2014 199  150 - 400 K/uL Final  . ABO/RH(D) 06/16/2014 B POS   Final  . Antibody Screen 06/16/2014 NEG   Final  . Sample Expiration 06/16/2014 06/19/2014   Final  . ABO/RH(D) 06/16/2014 B POS   Final  . Sodium 06/17/2014 141  135 - 145 mmol/L Final  . Potassium 06/17/2014 3.7  3.5 - 5.1 mmol/L Final  . Chloride 06/17/2014 106  96 - 112 mmol/L Final  . CO2 06/17/2014 28  19 - 32 mmol/L Final  . Glucose, Bld 06/17/2014 99  70 - 99 mg/dL Final  . BUN 06/17/2014 20  6 - 23 mg/dL  Final  . Creatinine, Ser 06/17/2014 0.70  0.50 - 1.10 mg/dL Final  . Calcium 06/17/2014 7.8* 8.4 - 10.5 mg/dL Final  . GFR calc non Af Amer 06/17/2014 81* >90 mL/min Final  . GFR calc Af Amer 06/17/2014 >90  >90 mL/min Final   Comment: (NOTE) The eGFR has been calculated using the CKD EPI equation. This calculation has not been validated in all clinical situations. eGFR's persistently <90 mL/min signify possible Chronic Kidney Disease.   . Anion gap 06/17/2014 7  5 - 15 Final  . WBC 06/17/2014 8.8  4.0 - 10.5 K/uL Final  . RBC 06/17/2014 3.83* 3.87 - 5.11 MIL/uL Final  . Hemoglobin 06/17/2014 11.6* 12.0 - 15.0 g/dL Final  . HCT 06/17/2014 36.1  36.0 - 46.0 % Final  . MCV 06/17/2014 94.3  78.0 - 100.0 fL Final  . MCH 06/17/2014 30.3  26.0 - 34.0 pg Final  . MCHC 06/17/2014 32.1  30.0 - 36.0 g/dL Final  . RDW 06/17/2014 15.2  11.5 - 15.5 % Final  . Platelets 06/17/2014 190  150 - 400 K/uL Final     Assessment/Plan  1. Open wound of knee, leg (except thigh), and ankle, complicated, unspecified laterality, subsequent encounter - silver sulfADIAZINE (SILVADENE) 1 % cream; Apply 1 application topically daily.  Dispense: 50 g; Refill: 0 - Aerobic Culture

## 2014-08-19 NOTE — Patient Instructions (Signed)
Change dressing daily. Use the compression bandage if the leg swelling and weeping resumes.

## 2014-08-23 ENCOUNTER — Other Ambulatory Visit: Payer: Self-pay | Admitting: Nurse Practitioner

## 2014-08-23 LAB — AEROBIC CULTURE

## 2014-08-24 ENCOUNTER — Ambulatory Visit (INDEPENDENT_AMBULATORY_CARE_PROVIDER_SITE_OTHER): Payer: Medicare Other | Admitting: Internal Medicine

## 2014-08-24 ENCOUNTER — Encounter: Payer: Self-pay | Admitting: Internal Medicine

## 2014-08-24 VITALS — BP 106/68 | HR 74 | Temp 98.1°F | Resp 20 | Ht 67.0 in | Wt 130.6 lb

## 2014-08-24 DIAGNOSIS — K5190 Ulcerative colitis, unspecified, without complications: Secondary | ICD-10-CM | POA: Diagnosis not present

## 2014-08-24 DIAGNOSIS — M069 Rheumatoid arthritis, unspecified: Secondary | ICD-10-CM | POA: Diagnosis not present

## 2014-08-24 DIAGNOSIS — L97919 Non-pressure chronic ulcer of unspecified part of right lower leg with unspecified severity: Secondary | ICD-10-CM

## 2014-08-24 DIAGNOSIS — E039 Hypothyroidism, unspecified: Secondary | ICD-10-CM

## 2014-08-24 DIAGNOSIS — L03115 Cellulitis of right lower limb: Secondary | ICD-10-CM

## 2014-08-24 DIAGNOSIS — R5383 Other fatigue: Secondary | ICD-10-CM

## 2014-08-24 DIAGNOSIS — I872 Venous insufficiency (chronic) (peripheral): Secondary | ICD-10-CM | POA: Diagnosis not present

## 2014-08-24 DIAGNOSIS — I87311 Chronic venous hypertension (idiopathic) with ulcer of right lower extremity: Secondary | ICD-10-CM | POA: Diagnosis not present

## 2014-08-24 DIAGNOSIS — R600 Localized edema: Secondary | ICD-10-CM | POA: Diagnosis not present

## 2014-08-24 DIAGNOSIS — I83019 Varicose veins of right lower extremity with ulcer of unspecified site: Secondary | ICD-10-CM

## 2014-08-24 DIAGNOSIS — M4802 Spinal stenosis, cervical region: Secondary | ICD-10-CM

## 2014-08-24 MED ORDER — CIPROFLOXACIN HCL 500 MG PO TABS
500.0000 mg | ORAL_TABLET | Freq: Two times a day (BID) | ORAL | Status: DC
Start: 2014-08-24 — End: 2014-08-27

## 2014-08-24 NOTE — Progress Notes (Signed)
Patient ID: [name], female   DOB: 11-17-1934, 80 y.o.   MRN: [id number]   Location:  [facility]  Code Status: full code Goals of Care: Advanced Directive information Does patient have an advance directive?: No, Would patient like information on creating an advanced directive?: Yes - Educational materials given (need to have discussion about this at future visit)   Chief Complaint  Patient presents with  . Medical Management of Chronic Issues    follow-up from rehab    HPI: Patient is a 80 y.o. white female seen in the office today for rehab follow up.    At [place] injury to right leg from leg rest for wheelchair.  It is not healing.   Saw Dr. Nyoka Cowden next.  No longer hurts.   He took a culture of the right shin which shows 4 different bacteria:  MRSA, klebsiella, pseudomonas and corynebacterium.  Left leg wound is healing.  Left knee recovered very well after knee fx.  Had multiple stitches in left lower leg--had a whole lot more edema than usual.  Sunday a week ago yesterday, she turned and bumped her red, super swollen leg.  Bled and bled, then oozed and oozed fluid.  Went to ED from 3pm to 9pm.  Wound dressing was applied.  Had tightly wound ace and TED on top.  Pain was excruciating with that on, finally loosened dressing.   Is getting around with quad cane.  Bilateral shoulders painful.  Left and right clavicle hurt.      Had some issues with bowels b/c of her humira and meds to prevent constipation.  Sees [facility] on 09/09/14.    Review of Systems:  Review of Systems  Constitutional: Positive for weight loss and malaise/fatigue. Negative for fever and chills.  HENT: Positive for hearing loss.   Eyes: Positive for blurred vision.  Respiratory: Negative for shortness of breath.   Cardiovascular: Positive for leg swelling. Negative for chest pain.       Left leg  Gastrointestinal: Negative for abdominal pain.  Genitourinary:  Negative for dysuria, urgency and frequency.  Musculoskeletal: Positive for joint pain and falls.  Skin: Negative for rash.       Dry, thin skin; ulcers/wounds as in PE  Neurological: Negative for dizziness and weakness.  Psychiatric/Behavioral: Negative for depression and memory loss.    Past Medical History  Diagnosis Date  . Ulcerative colitis   . Hypothyroidism   . Hypertension     pcp   dr Nazeer Romney   [facility]  . Pneumonia 2008  . Chronic bronchitis 10/31/2011    "prone to it; I have sinus/bronchitis problem probably q yr"  . Multiple skin tears 10/31/2011    "get them very easily; my skin is thin and I bruise easily"  . Chronic back pain     "my entire spine"  . Edema   . Other and unspecified hyperlipidemia   . Encounter for long-term (current) use of other medications   . Senile osteoporosis   . Spinal stenosis, unspecified region other than cervical   . Unspecified glaucoma   . Hypothyroidism   . Complication of anesthesia 2009    htn with block- for hand surgery   . Anxiety     sometimes   . Rheumatoid arthritis(714.0)     degenerative disc disease     Past Surgical History  Procedure Laterality Date  . Back surgery    . Sinus surgery with  instatrak    . Thyroidectomy, partial  1997  . Posterior fusion lumbar spine  10/31/2011    L2-3; 3-4  . Tonsillectomy      "when I was a small child"  . Functional endoscopic sinus surgery  1988  . Posterior fusion lumbar spine  11/2009    L3-4;  L4-5  . Joint replacement  2009; 2006    joints 2 fingers,rt hand; joint left thumb  . Spine surgery    . Posterior lumbar fusion N/A 08/05/13    T1, T2  . Eye surgery    . Anterior cervical decomp/discectomy fusion N/A 02/17/2014    Procedure: CERVICAL FOUR TO FIVE ANTERIOR CERVICAL DECOMPRESSION/DISCECTOMY FUSION 1 LEVEL;  Surgeon: [name], MD;  Location: [facility];  Service: Neurosurgery;  Laterality: N/A;  C4-5 Anterior cervical decompression/diskectomy/fusion  .  Cataract extraction Left 11/11/2013  . Cataract extraction Right 12/30/2013  . Total knee arthroplasty Left 06/16/2014    Procedure: TOTAL KNEE ARTHROPLASTY;  Surgeon: Paralee Cancel, MD;  Location: [facility];  Service: Orthopedics;  Laterality: Left;    No Known Allergies Medications: Patient's Medications  New Prescriptions   CIPROFLOXACIN (CIPRO) 500 MG TABLET    Take 1 tablet (500 mg total) by mouth 2 (two) times daily.  Previous Medications   ADALIMUMAB (HUMIRA) 40 MG/0.8ML INJECTION    Inject 0.8 mLs (40 mg total) into the skin every 14 (fourteen) days. Resume ONLY if off all antibiotics   ASPIRIN EC 81 MG TABLET    Take 1 tablet (81 mg total) by mouth once.   BALSALAZIDE (COLAZAL) 750 MG CAPSULE    Take 2,250 mg by mouth 2 (two) times daily.    CALCIUM CARB-CHOLECALCIFEROL (CALCIUM + D3) 600-200 MG-UNIT TABS    Take 1 tablet by mouth daily.   CELEBREX 200 MG CAPSULE    Take 200 mg by mouth 2 (two) times daily.    CHOLECALCIFEROL (VITAMIN D3) 1000 UNITS CAPS    Take 1,000 Units by mouth daily.   DIAZEPAM (VALIUM) 5 MG TABLET    Take 1 tablet (5 mg total) by mouth every 6 (six) hours as needed for muscle spasms.   ESTRADIOL (ESTRACE) 0.5 MG TABLET    Take 0.5 mg by mouth daily.    FERROUS SULFATE 325 (65 FE) MG TABLET    Take 1 tablet (325 mg total) by mouth daily.   FOLIC ACID (FOLVITE) 1 MG TABLET    Take 2 mg by mouth daily.   FUROSEMIDE (LASIX) 20 MG TABLET    1 BY MOUTH DAILY AS NEEDED   GUAIFENESIN (MUCINEX) 600 MG 12 HR TABLET    Take 1,200 mg by mouth daily as needed (sinus relief).    HYDROCHLOROTHIAZIDE (HYDRODIURIL) 25 MG TABLET    TAKE 1 TABLET (25 MG TOTAL) BY MOUTH DAILY.   HYDROCODONE-ACETAMINOPHEN (NORCO/VICODIN) 5-325 MG PER TABLET    Take 1 tablet by mouth every 6 (six) hours as needed for moderate pain (pain).   LATANOPROST (XALATAN) 0.005 % OPHTHALMIC SOLUTION    Place 1 drop into both eyes at bedtime.   LEFLUNOMIDE (ARAVA) 20 MG TABLET    Take 20 mg by mouth daily.    LEVOTHYROXINE (SYNTHROID) 88 MCG TABLET    Take 88 mcg by mouth daily before breakfast. DAW, DO NOT SUBSTITUTE WITH GENERIC   MEDROXYPROGESTERONE (PROVERA) 2.5 MG TABLET    Take 2.5 mg by mouth at bedtime.    MULTIPLE VITAMIN (MULTIVITAMIN WITH MINERALS) TABS    Take  1 tablet by mouth daily.   OMEGA-3 FATTY ACIDS (FISH OIL) 1200 MG CAPS    Take 1,200 mg by mouth daily.   OXYCODONE-ACETAMINOPHEN (PERCOCET/ROXICET) 5-325 MG PER TABLET    Take 1-2 tablets by mouth every 8 (eight) hours as needed for severe pain.   POTASSIUM CHLORIDE SA (K-DUR,KLOR-CON) 20 MEQ TABLET    Take 20 mEq by mouth daily.   PREDNISONE (DELTASONE) 5 MG TABLET    Take 5 mg by mouth daily with breakfast.    SILVER SULFADIAZINE (SILVADENE) 1 % CREAM    Apply 1 application topically daily.  Modified Medications   Modified Medication Previous Medication   HYDROCHLOROTHIAZIDE (HYDRODIURIL) 25 MG TABLET hydrochlorothiazide (HYDRODIURIL) 25 MG tablet      TAKE 1 TABLET (25 MG TOTAL) BY MOUTH DAILY.    Take 1 tablet (25 mg total) by mouth daily.  Discontinued Medications   HUMIRA PEN 40 MG/0.8ML PNKT        Physical Exam: Filed Vitals:   08/24/14 1008  BP: 106/68  Pulse: 74  Temp: 98.1 F (36.7 C)  TempSrc: Oral  Resp: 20  Height: 5\' 7"  (1.702 m)  Weight: 130 lb 9.6 oz (59.24 kg)  SpO2: 97%   Physical Exam  Constitutional:  Thin white female ambulating with cane   Cardiovascular: Normal rate, regular rhythm, normal heart sounds and intact distal pulses.   Pulmonary/Chest: Effort normal and breath sounds normal. No respiratory distress. She has no wheezes.  Abdominal: Soft. Bowel sounds are normal.  Skin:  Right medial lower leg with approximately nickel sized wound with yellow granulation tissue and some foul drainage with surrounding erythema present (was cultured last visit with Dr. Nyoka Cowden); left wounds:  Left lateral leg surgical incision site appears it was dehisced in midsection--thin segment remains open with  yellow tissue on edges, but pink tissue visible in the middle; left anterior shin with round quarter-sized wound with eschar on superior aspect, granulation tissue on inferior aspect, no drainage--only presence of silver sulfadiazine being used on it (had hematoma beneath that ruptured before ED visit); has chronic venous stasis skin changes and dry thin skin bilaterally; scar from left knee surgery over patella well-healed    Labs reviewed: Basic Metabolic Panel:  Recent Labs  08/27/13 1506  03/23/14 0854 06/13/14 1407 06/14/14 0425 06/17/14 0430  NA 143  < > 141 141 140 141  K 4.9  < > 3.7 3.1* 4.0 3.7  CL 102  < > 100 100 107 106  CO2 24  < > 26 29 27 28   GLUCOSE 140*  < > 74 102* 91 99  BUN 26  < > 22 21 26* 20  CREATININE 0.91  < > 0.88 0.74 0.82 0.70  CALCIUM 9.6  < > 9.4 8.5 7.8* 7.8*  TSH 0.596  --  2.180  --   --   --   < > = values in this interval not displayed. Liver Function Tests:  Recent Labs  03/23/14 0854  AST 26  ALT 9  ALKPHOS 108  BILITOT 0.5  PROT 5.7*   No results for input(s): LIPASE, AMYLASE in the last 8760 hours. No results for input(s): AMMONIA in the last 8760 hours. CBC:  Recent Labs  08/27/13 1506  03/23/14 0854 06/13/14 1407 06/14/14 0425 06/17/14 0430  WBC 5.2  < > 6.0 9.2 6.1 8.8  NEUTROABS 3.8  --  2.7  --   --   --   HGB 11.5  < > 13.4  14.8 12.2 11.6*  HCT 34.7  < > 39.8 44.8 37.7 36.1  MCV 91  < > 90 92.4 93.1 94.3  PLT 314  < > 225 218 199 190  < > = values in this interval not displayed. Lipid Panel:  Recent Labs  03/23/14 0854  CHOL 239*  HDL 86  LDLCALC 130*  TRIG 115  CHOLHDL 2.8   Procedures since last visit: 06/12/14: CT left knee without contrast:  1. Comminuted depressed intra-articular fracture of the posterior aspect of the lateral tibial plateau as described. 2. Associated hemarthrosis and probable synovitis. 3. Hyperextension of the knee suggesting possible ACL injury.  06/13/14:  Left shoulder xray:   Acute minimally displaced left distal clavicle fracture. Osteopenia Healed proximal left humerus surgical neck fracture with deformity  Assessment/Plan 1. Cellulitis of leg, right - from right leg wound infection--appears to now be a venous ulcer -she had originally gotten a skin tear from the wheelchair leg rest here -wound was cultured when she saw Dr. Nyoka Cowden and large amounts of pus were coming out -it grew out MRSA, klebsiella, pseudomonas and corynebacterium with all but corynebacterium sensitive to cipro or levaquin (corynebacterium was not run) - will treat with ciprofloxacin (CIPRO) 500 MG tablet; Take 1 tablet (500 mg total) by mouth 2 (two) times daily.  Dispense: 20 tablet; Refill: 0 -hold next humira injection due tomorrow - CBC with Differential/Platelet - Comprehensive metabolic panel  2. Venous ulcer of right leg -as above, has wound care appt 6/29, but I will see her again in 2 wks (before that) -will cont same treatment but pt to change every other day due to her difficulties doing self-care b/c of her severe RA - Comprehensive metabolic panel  3. Leg edema, left - has improved (looks baseline to me) -had been much more swollen, red before she bumped it after getting home from rehab and large amts of blood came out--suspect she had a hematoma beneath the skin - Comprehensive metabolic panel  4. Chronic venous insufficiency -had prior ABIs last year which were normal -she still smokes some -no DVTs were found - Comprehensive metabolic panel  5. Spinal stenosis in cervical region - chronic pain, follows with Dr. Ellene Route -had neck imaging when she had her falls and fxs - Comprehensive metabolic panel  6. Rheumatoid arthritis - on humira--will hold until she completes her abx - Comprehensive metabolic panel  7. Ulcerative colitis, without complications -on humira, hold until abx complete -had some difficulty with this during her rehab stay when on pain meds and  stool softeners  8. Hypothyroidism, unspecified hypothyroidism type -cont current synthroid, but reassess tsh - TSH  9. Other fatigue -will f/u her iron deficiency labs--was put on iron and vitamins at rehab and needs to know what to keep taking - Comprehensive metabolic panel - Ferritin - Iron and TIBC  Labs/tests ordered: Orders Placed This Encounter  Procedures  . CBC with Differential/Platelet  . Comprehensive metabolic panel  . Ferritin  . Iron and TIBC  . TSH    Next appt:  2 wks f/u on leg wounds  Eleuterio [employer]. [employer]ikes, D.O. [facility] [address], [place] [address] Cell Phone (Mon-Fri 8am-5pm):  [phone] On Call:  [phone] & follow prompts after 5pm & weekends Office Phone:  [phone] Office Fax:  [phone]

## 2014-08-25 ENCOUNTER — Telehealth: Payer: Self-pay | Admitting: Internal Medicine

## 2014-08-25 ENCOUNTER — Encounter: Payer: Self-pay | Admitting: Internal Medicine

## 2014-08-25 LAB — CBC WITH DIFFERENTIAL/PLATELET
Basophils Absolute: 0 10*3/uL (ref 0.0–0.2)
Basos: 1 %
EOS (ABSOLUTE): 0.1 10*3/uL (ref 0.0–0.4)
Eos: 1 %
Hematocrit: 37.8 % (ref 34.0–46.6)
Hemoglobin: 12 g/dL (ref 11.1–15.9)
Immature Grans (Abs): 0 10*3/uL (ref 0.0–0.1)
Immature Granulocytes: 0 %
Lymphocytes Absolute: 0.7 10*3/uL (ref 0.7–3.1)
Lymphs: 10 %
MCH: 29.3 pg (ref 26.6–33.0)
MCHC: 31.7 g/dL (ref 31.5–35.7)
MCV: 92 fL (ref 79–97)
Monocytes Absolute: 0.5 10*3/uL (ref 0.1–0.9)
Monocytes: 7 %
Neutrophils Absolute: 6.1 10*3/uL (ref 1.4–7.0)
Neutrophils: 81 %
Platelets: 338 10*3/uL (ref 150–379)
RBC: 4.1 x10E6/uL (ref 3.77–5.28)
RDW: 14.8 % (ref 12.3–15.4)
WBC: 7.5 10*3/uL (ref 3.4–10.8)

## 2014-08-25 LAB — COMPREHENSIVE METABOLIC PANEL
ALT: 12 IU/L (ref 0–32)
AST: 28 IU/L (ref 0–40)
Albumin/Globulin Ratio: 1.9 (ref 1.1–2.5)
Albumin: 4 g/dL (ref 3.5–4.8)
Alkaline Phosphatase: 156 IU/L — ABNORMAL HIGH (ref 39–117)
BUN/Creatinine Ratio: 30 — ABNORMAL HIGH (ref 11–26)
BUN: 26 mg/dL (ref 8–27)
Bilirubin Total: 0.3 mg/dL (ref 0.0–1.2)
CO2: 25 mmol/L (ref 18–29)
Calcium: 9.3 mg/dL (ref 8.7–10.3)
Chloride: 101 mmol/L (ref 97–108)
Creatinine, Ser: 0.87 mg/dL (ref 0.57–1.00)
GFR calc Af Amer: 74 mL/min/{1.73_m2} (ref >59)
GFR calc non Af Amer: 64 mL/min/{1.73_m2} (ref >59)
Globulin, Total: 2.1 g/dL (ref 1.5–4.5)
Glucose: 102 mg/dL — ABNORMAL HIGH (ref 65–99)
Potassium: 4 mmol/L (ref 3.5–5.2)
Sodium: 143 mmol/L (ref 134–144)
Total Protein: 6.1 g/dL (ref 6.0–8.5)

## 2014-08-25 LAB — FERRITIN: Ferritin: 148 ng/mL (ref 15–150)

## 2014-08-25 LAB — TSH: TSH: 0.457 u[IU]/mL (ref 0.450–4.500)

## 2014-08-25 LAB — IRON AND TIBC
Iron Saturation: 20 % (ref 15–55)
Iron: 56 ug/dL (ref 27–139)
Total Iron Binding Capacity: 279 ug/dL (ref 250–450)
UIBC: 223 ug/dL (ref 118–369)

## 2014-08-25 NOTE — Telephone Encounter (Signed)
Called pt regarding labs results.  N/A.  Left message.  Accidentally, I press the wrong button and removed the labs from the results list before adding a message.. Sending patient results of her labs.  [facility]

## 2014-08-27 ENCOUNTER — Other Ambulatory Visit: Payer: Self-pay | Admitting: Internal Medicine

## 2014-08-27 ENCOUNTER — Telehealth: Payer: Self-pay | Admitting: *Deleted

## 2014-08-27 DIAGNOSIS — L03115 Cellulitis of right lower limb: Secondary | ICD-10-CM

## 2014-08-27 MED ORDER — SULFAMETHOXAZOLE-TRIMETHOPRIM 800-160 MG PO TABS
1.0000 | ORAL_TABLET | Freq: Two times a day (BID) | ORAL | Status: DC
Start: 2014-08-27 — End: 2014-09-28

## 2014-08-27 NOTE — Telephone Encounter (Signed)
What side effect was she concerned about?  We can use bactrim DS one tablet by mouth twice a day for 7 days instead.  I will send that to her pharmacy.

## 2014-08-27 NOTE — Telephone Encounter (Signed)
Tried calling patient and left voicemail message to return call. Will try again later.

## 2014-08-27 NOTE — Telephone Encounter (Signed)
Patient was notified with labs by [facility]. Patient has concerns with taking Cipro due to reading that Cipro isn't good for you while taking Prednisone and she has been taking Prednisone. Please Advise.

## 2014-08-28 NOTE — Telephone Encounter (Signed)
Patient stated that she was worried about muscle pains. Patient aware and wants the bactrim called in. Informed her that Dr. Mariea Clonts faxed medication to pharmacy. Patient agreed.

## 2014-09-02 ENCOUNTER — Encounter (HOSPITAL_BASED_OUTPATIENT_CLINIC_OR_DEPARTMENT_OTHER): Payer: Medicare Other | Attending: Surgery

## 2014-09-02 DIAGNOSIS — L97221 Non-pressure chronic ulcer of left calf limited to breakdown of skin: Secondary | ICD-10-CM | POA: Insufficient documentation

## 2014-09-02 DIAGNOSIS — M069 Rheumatoid arthritis, unspecified: Secondary | ICD-10-CM | POA: Diagnosis not present

## 2014-09-02 DIAGNOSIS — Z981 Arthrodesis status: Secondary | ICD-10-CM | POA: Insufficient documentation

## 2014-09-02 DIAGNOSIS — K5190 Ulcerative colitis, unspecified, without complications: Secondary | ICD-10-CM | POA: Insufficient documentation

## 2014-09-02 DIAGNOSIS — Z8249 Family history of ischemic heart disease and other diseases of the circulatory system: Secondary | ICD-10-CM | POA: Diagnosis not present

## 2014-09-02 DIAGNOSIS — Z96659 Presence of unspecified artificial knee joint: Secondary | ICD-10-CM | POA: Diagnosis not present

## 2014-09-02 DIAGNOSIS — Z79899 Other long term (current) drug therapy: Secondary | ICD-10-CM | POA: Diagnosis not present

## 2014-09-02 DIAGNOSIS — L97211 Non-pressure chronic ulcer of right calf limited to breakdown of skin: Secondary | ICD-10-CM | POA: Diagnosis not present

## 2014-09-02 DIAGNOSIS — I10 Essential (primary) hypertension: Secondary | ICD-10-CM | POA: Insufficient documentation

## 2014-09-02 DIAGNOSIS — F17200 Nicotine dependence, unspecified, uncomplicated: Secondary | ICD-10-CM | POA: Insufficient documentation

## 2014-09-02 DIAGNOSIS — G8929 Other chronic pain: Secondary | ICD-10-CM | POA: Insufficient documentation

## 2014-09-02 DIAGNOSIS — L97811 Non-pressure chronic ulcer of other part of right lower leg limited to breakdown of skin: Secondary | ICD-10-CM | POA: Diagnosis present

## 2014-09-02 DIAGNOSIS — E039 Hypothyroidism, unspecified: Secondary | ICD-10-CM | POA: Insufficient documentation

## 2014-09-02 DIAGNOSIS — Z841 Family history of disorders of kidney and ureter: Secondary | ICD-10-CM | POA: Diagnosis not present

## 2014-09-02 DIAGNOSIS — M549 Dorsalgia, unspecified: Secondary | ICD-10-CM | POA: Diagnosis not present

## 2014-09-07 ENCOUNTER — Encounter: Payer: Self-pay | Admitting: Internal Medicine

## 2014-09-07 ENCOUNTER — Ambulatory Visit (INDEPENDENT_AMBULATORY_CARE_PROVIDER_SITE_OTHER): Payer: Medicare Other | Admitting: Internal Medicine

## 2014-09-07 VITALS — BP 120/70 | HR 62 | Temp 98.6°F | Resp 18 | Ht 68.0 in | Wt 127.4 lb

## 2014-09-07 DIAGNOSIS — L97919 Non-pressure chronic ulcer of unspecified part of right lower leg with unspecified severity: Secondary | ICD-10-CM

## 2014-09-07 DIAGNOSIS — I872 Venous insufficiency (chronic) (peripheral): Secondary | ICD-10-CM | POA: Diagnosis not present

## 2014-09-07 DIAGNOSIS — R5382 Chronic fatigue, unspecified: Secondary | ICD-10-CM | POA: Diagnosis not present

## 2014-09-07 DIAGNOSIS — K5190 Ulcerative colitis, unspecified, without complications: Secondary | ICD-10-CM

## 2014-09-07 DIAGNOSIS — I83019 Varicose veins of right lower extremity with ulcer of unspecified site: Secondary | ICD-10-CM

## 2014-09-07 DIAGNOSIS — E039 Hypothyroidism, unspecified: Secondary | ICD-10-CM | POA: Diagnosis not present

## 2014-09-07 DIAGNOSIS — M7551 Bursitis of right shoulder: Secondary | ICD-10-CM | POA: Diagnosis not present

## 2014-09-07 DIAGNOSIS — I87311 Chronic venous hypertension (idiopathic) with ulcer of right lower extremity: Secondary | ICD-10-CM | POA: Diagnosis not present

## 2014-09-07 DIAGNOSIS — M069 Rheumatoid arthritis, unspecified: Secondary | ICD-10-CM | POA: Diagnosis not present

## 2014-09-07 MED ORDER — FUROSEMIDE 20 MG PO TABS: 20.0000 mg | ORAL_TABLET | ORAL | Status: DC

## 2014-09-07 NOTE — Patient Instructions (Addendum)
Stop lasix altogether Eat bananas Monitor edema in legs and weight at home at least every other day. Let me know if your weight goes up more than 3 lbs between weights and we will plan to d/c hctz and restart lasix  Impingement Syndrome, Rotator Cuff, Bursitis with Rehab Impingement syndrome is a condition that involves inflammation of the tendons of the rotator cuff and the subacromial bursa, that causes pain in the shoulder. The rotator cuff consists of four tendons and muscles that control much of the shoulder and upper arm function. The subacromial bursa is a fluid filled sac that helps reduce friction between the rotator cuff and one of the bones of the shoulder (acromion). Impingement syndrome is usually an overuse injury that causes swelling of the bursa (bursitis), swelling of the tendon (tendonitis), and/or a tear of the tendon (strain). Strains are classified into three categories. Grade 1 strains cause pain, but the tendon is not lengthened. Grade 2 strains include a lengthened ligament, due to the ligament being stretched or partially ruptured. With grade 2 strains there is still function, although the function may be decreased. Grade 3 strains include a complete tear of the tendon or muscle, and function is usually impaired. SYMPTOMS   Pain around the shoulder, often at the outer portion of the upper arm.  Pain that gets worse with shoulder function, especially when reaching overhead or lifting.  Sometimes, aching when not using the arm.  Pain that wakes you up at night.  Sometimes, tenderness, swelling, warmth, or redness over the affected area.  Loss of strength.  Limited motion of the shoulder, especially reaching behind the back (to the back pocket or to unhook bra) or across your body.  Crackling sound (crepitation) when moving the arm.  Biceps tendon pain and inflammation (in the front of the shoulder). Worse when bending the elbow or lifting. CAUSES  Impingement  syndrome is often an overuse injury, in which chronic (repetitive) motions cause the tendons or bursa to become inflamed. A strain occurs when a force is paced on the tendon or muscle that is greater than it can withstand. Common mechanisms of injury include: Stress from sudden increase in duration, frequency, or intensity of training.  Direct hit (trauma) to the shoulder.  Aging, erosion of the tendon with normal use.  Bony bump on shoulder (acromial spur). RISK INCREASES WITH:  Contact sports (football, wrestling, boxing).  Throwing sports (baseball, tennis, volleyball).  Weightlifting and bodybuilding.  Heavy labor.  Previous injury to the rotator cuff, including impingement.  Poor shoulder strength and flexibility.  Failure to warm up properly before activity.  Inadequate protective equipment.  Old age.  Bony bump on shoulder (acromial spur). PREVENTION   Warm up and stretch properly before activity.  Allow for adequate recovery between workouts.  Maintain physical fitness:  Strength, flexibility, and endurance.  Cardiovascular fitness.  Learn and use proper exercise technique. PROGNOSIS  If treated properly, impingement syndrome usually goes away within 6 weeks. Sometimes surgery is required.  RELATED COMPLICATIONS   Longer healing time if not properly treated, or if not given enough time to heal.  Recurring symptoms, that result in a chronic condition.  Shoulder stiffness, frozen shoulder, or loss of motion.  Rotator cuff tendon tear.  Recurring symptoms, especially if activity is resumed too soon, with overuse, with a direct blow, or when using poor technique. TREATMENT  Treatment first involves the use of ice and medicine, to reduce pain and inflammation. The use of strengthening and stretching  exercises may help reduce pain with activity. These exercises may be performed at home or with a therapist. If non-surgical treatment is unsuccessful after more  than 6 months, surgery may be advised. After surgery and rehabilitation, activity is usually possible in 3 months.  MEDICATION  If pain medicine is needed, nonsteroidal anti-inflammatory medicines (aspirin and ibuprofen), or other minor pain relievers (acetaminophen), are often advised.  Do not take pain medicine for 7 days before surgery.  Prescription pain relievers may be given, if your caregiver thinks they are needed. Use only as directed and only as much as you need.  Corticosteroid injections may be given by your caregiver. These injections should be reserved for the most serious cases, because they may only be given a certain number of times. HEAT AND COLD  Cold treatment (icing) should be applied for 10 to 15 minutes every 2 to 3 hours for inflammation and pain, and immediately after activity that aggravates your symptoms. Use ice packs or an ice massage.  Heat treatment may be used before performing stretching and strengthening activities prescribed by your caregiver, physical therapist, or athletic trainer. Use a heat pack or a warm water soak. SEEK MEDICAL CARE IF:   Symptoms get worse or do not improve in 4 to 6 weeks, despite treatment.  New, unexplained symptoms develop. (Drugs used in treatment may produce side effects.) EXERCISES  RANGE OF MOTION (ROM) AND STRETCHING EXERCISES - Impingement Syndrome (Rotator Cuff  Tendinitis, Bursitis) These exercises may help you when beginning to rehabilitate your injury. Your symptoms may go away with or without further involvement from your physician, physical therapist or athletic trainer. While completing these exercises, remember:   Restoring tissue flexibility helps normal motion to return to the joints. This allows healthier, less painful movement and activity.  An effective stretch should be held for at least 30 seconds.  A stretch should never be painful. You should only feel a gentle lengthening or release in the stretched  tissue. STRETCH - Flexion, Standing  Stand with good posture. With an underhand grip on your right / left hand, and an overhand grip on the opposite hand, grasp a broomstick or cane so that your hands are a little more than shoulder width apart.  Keeping your right / left elbow straight and shoulder muscles relaxed, push the stick with your opposite hand, to raise your right / left arm in front of your body and then overhead. Raise your arm until you feel a stretch in your right / left shoulder, but before you have increased shoulder pain.  Try to avoid shrugging your right / left shoulder as your arm rises, by keeping your shoulder blade tucked down and toward your mid-back spine. Hold for __________ seconds.  Slowly return to the starting position. Repeat __________ times. Complete this exercise __________ times per day. STRETCH - Abduction, Supine  Lie on your back. With an underhand grip on your right / left hand and an overhand grip on the opposite hand, grasp a broomstick or cane so that your hands are a little more than shoulder width apart.  Keeping your right / left elbow straight and your shoulder muscles relaxed, push the stick with your opposite hand, to raise your right / left arm out to the side of your body and then overhead. Raise your arm until you feel a stretch in your right / left shoulder, but before you have increased shoulder pain.  Try to avoid shrugging your right / left shoulder as your  arm rises, by keeping your shoulder blade tucked down and toward your mid-back spine. Hold for __________ seconds.  Slowly return to the starting position. Repeat __________ times. Complete this exercise __________ times per day. ROM - Flexion, Active-Assisted  Lie on your back. You may bend your knees for comfort.  Grasp a broomstick or cane so your hands are about shoulder width apart. Your right / left hand should grip the end of the stick, so that your hand is positioned  "thumbs-up," as if you were about to shake hands.  Using your healthy arm to lead, raise your right / left arm overhead, until you feel a gentle stretch in your shoulder. Hold for __________ seconds.  Use the stick to assist in returning your right / left arm to its starting position. Repeat __________ times. Complete this exercise __________ times per day.  ROM - Internal Rotation, Supine   Lie on your back on a firm surface. Place your right / left elbow about 60 degrees away from your side. Elevate your elbow with a folded towel, so that the elbow and shoulder are the same height.  Using a broomstick or cane and your strong arm, pull your right / left hand toward your body until you feel a gentle stretch, but no increase in your shoulder pain. Keep your shoulder and elbow in place throughout the exercise.  Hold for __________ seconds. Slowly return to the starting position. Repeat __________ times. Complete this exercise __________ times per day. STRETCH - Internal Rotation  Place your right / left hand behind your back, palm up.  Throw a towel or belt over your opposite shoulder. Grasp the towel with your right / left hand.  While keeping an upright posture, gently pull up on the towel, until you feel a stretch in the front of your right / left shoulder.  Avoid shrugging your right / left shoulder as your arm rises, by keeping your shoulder blade tucked down and toward your mid-back spine.  Hold for __________ seconds. Release the stretch, by lowering your healthy hand. Repeat __________ times. Complete this exercise __________ times per day. ROM - Internal Rotation   Using an underhand grip, grasp a stick behind your back with both hands.  While standing upright with good posture, slide the stick up your back until you feel a mild stretch in the front of your shoulder.  Hold for __________ seconds. Slowly return to your starting position. Repeat __________ times. Complete this  exercise __________ times per day.  STRETCH - Posterior Shoulder Capsule   Stand or sit with good posture. Grasp your right / left elbow and draw it across your chest, keeping it at the same height as your shoulder.  Pull your elbow, so your upper arm comes in closer to your chest. Pull until you feel a gentle stretch in the back of your shoulder.  Hold for __________ seconds. Repeat __________ times. Complete this exercise __________ times per day. STRENGTHENING EXERCISES - Impingement Syndrome (Rotator Cuff Tendinitis, Bursitis) These exercises may help you when beginning to rehabilitate your injury. They may resolve your symptoms with or without further involvement from your physician, physical therapist or athletic trainer. While completing these exercises, remember:  Muscles can gain both the endurance and the strength needed for everyday activities through controlled exercises.  Complete these exercises as instructed by your physician, physical therapist or athletic trainer. Increase the resistance and repetitions only as guided.  You may experience muscle soreness or fatigue, but the pain  or discomfort you are trying to eliminate should never worsen during these exercises. If this pain does get worse, stop and make sure you are following the directions exactly. If the pain is still present after adjustments, discontinue the exercise until you can discuss the trouble with your clinician.  During your recovery, avoid activity or exercises which involve actions that place your injured hand or elbow above your head or behind your back or head. These positions stress the tissues which you are trying to heal. STRENGTH - Scapular Depression and Adduction   With good posture, sit on a firm chair. Support your arms in front of you, with pillows, arm rests, or on a table top. Have your elbows in line with the sides of your body.  Gently draw your shoulder blades down and toward your mid-back  spine. Gradually increase the tension, without tensing the muscles along the top of your shoulders and the back of your neck.  Hold for __________ seconds. Slowly release the tension and relax your muscles completely before starting the next repetition.  After you have practiced this exercise, remove the arm support and complete the exercise in standing as well as sitting position. Repeat __________ times. Complete this exercise __________ times per day.  STRENGTH - Shoulder Abductors, Isometric  With good posture, stand or sit about 4-6 inches from a wall, with your right / left side facing the wall.  Bend your right / left elbow. Gently press your right / left elbow into the wall. Increase the pressure gradually, until you are pressing as hard as you can, without shrugging your shoulder or increasing any shoulder discomfort.  Hold for __________ seconds.  Release the tension slowly. Relax your shoulder muscles completely before you begin the next repetition. Repeat __________ times. Complete this exercise __________ times per day.  STRENGTH - External Rotators, Isometric  Keep your right / left elbow at your side and bend it 90 degrees.  Step into a door frame so that the outside of your right / left wrist can press against the door frame without your upper arm leaving your side.  Gently press your right / left wrist into the door frame, as if you were trying to swing the back of your hand away from your stomach. Gradually increase the tension, until you are pressing as hard as you can, without shrugging your shoulder or increasing any shoulder discomfort.  Hold for __________ seconds.  Release the tension slowly. Relax your shoulder muscles completely before you begin the next repetition. Repeat __________ times. Complete this exercise __________ times per day.  STRENGTH - Supraspinatus   Stand or sit with good posture. Grasp a __________ weight, or an exercise band or tubing, so  that your hand is "thumbs-up," like you are shaking hands.  Slowly lift your right / left arm in a "V" away from your thigh, diagonally into the space between your side and straight ahead. Lift your hand to shoulder height or as far as you can, without increasing any shoulder pain. At first, many people do not lift their hands above shoulder height.  Avoid shrugging your right / left shoulder as your arm rises, by keeping your shoulder blade tucked down and toward your mid-back spine.  Hold for __________ seconds. Control the descent of your hand, as you slowly return to your starting position. Repeat __________ times. Complete this exercise __________ times per day.  STRENGTH - External Rotators  Secure a rubber exercise band or tubing to a fixed  object (table, pole) so that it is at the same height as your right / left elbow when you are standing or sitting on a firm surface.  Stand or sit so that the secured exercise band is at your uninjured side.  Bend your right / left elbow 90 degrees. Place a folded towel or small pillow under your right / left arm, so that your elbow is a few inches away from your side.  Keeping the tension on the exercise band, pull it away from your body, as if pivoting on your elbow. Be sure to keep your body steady, so that the movement is coming only from your rotating shoulder.  Hold for __________ seconds. Release the tension in a controlled manner, as you return to the starting position. Repeat __________ times. Complete this exercise __________ times per day.  STRENGTH - Internal Rotators   Secure a rubber exercise band or tubing to a fixed object (table, pole) so that it is at the same height as your right / left elbow when you are standing or sitting on a firm surface.  Stand or sit so that the secured exercise band is at your right / left side.  Bend your elbow 90 degrees. Place a folded towel or small pillow under your right / left arm so that your  elbow is a few inches away from your side.  Keeping the tension on the exercise band, pull it across your body, toward your stomach. Be sure to keep your body steady, so that the movement is coming only from your rotating shoulder.  Hold for __________ seconds. Release the tension in a controlled manner, as you return to the starting position. Repeat __________ times. Complete this exercise __________ times per day.  STRENGTH - Scapular Protractors, Standing   Stand arms length away from a wall. Place your hands on the wall, keeping your elbows straight.  Begin by dropping your shoulder blades down and toward your mid-back spine.  To strengthen your protractors, keep your shoulder blades down, but slide them forward on your rib cage. It will feel as if you are lifting the back of your rib cage away from the wall. This is a subtle motion and can be challenging to complete. Ask your caregiver for further instruction, if you are not sure you are doing the exercise correctly.  Hold for __________ seconds. Slowly return to the starting position, resting the muscles completely before starting the next repetition. Repeat __________ times. Complete this exercise __________ times per day. STRENGTH - Scapular Protractors, Supine  Lie on your back on a firm surface. Extend your right / left arm straight into the air while holding a __________ weight in your hand.  Keeping your head and back in place, lift your shoulder off the floor.  Hold for __________ seconds. Slowly return to the starting position, and allow your muscles to relax completely before starting the next repetition. Repeat __________ times. Complete this exercise __________ times per day. STRENGTH - Scapular Protractors, Quadruped  Get onto your hands and knees, with your shoulders directly over your hands (or as close as you can be, comfortably).  Keeping your elbows locked, lift the back of your rib cage up into your shoulder  blades, so your mid-back rounds out. Keep your neck muscles relaxed.  Hold this position for __________ seconds. Slowly return to the starting position and allow your muscles to relax completely before starting the next repetition. Repeat __________ times. Complete this exercise __________ times per day.  STRENGTH - Scapular Retractors  Secure a rubber exercise band or tubing to a fixed object (table, pole), so that it is at the height of your shoulders when you are either standing, or sitting on a firm armless chair.  With a palm down grip, grasp an end of the band in each hand. Straighten your elbows and lift your hands straight in front of you, at shoulder height. Step back, away from the secured end of the band, until it becomes tense.  Squeezing your shoulder blades together, draw your elbows back toward your sides, as you bend them. Keep your upper arms lifted away from your body throughout the exercise.  Hold for __________ seconds. Slowly ease the tension on the band, as you reverse the directions and return to the starting position. Repeat __________ times. Complete this exercise __________ times per day. STRENGTH - Shoulder Extensors   Secure a rubber exercise band or tubing to a fixed object (table, pole) so that it is at the height of your shoulders when you are either standing, or sitting on a firm armless chair.  With a thumbs-up grip, grasp an end of the band in each hand. Straighten your elbows and lift your hands straight in front of you, at shoulder height. Step back, away from the secured end of the band, until it becomes tense.  Squeezing your shoulder blades together, pull your hands down to the sides of your thighs. Do not allow your hands to go behind you.  Hold for __________ seconds. Slowly ease the tension on the band, as you reverse the directions and return to the starting position. Repeat __________ times. Complete this exercise __________ times per day.  STRENGTH  - Scapular Retractors and External Rotators   Secure a rubber exercise band or tubing to a fixed object (table, pole) so that it is at the height as your shoulders, when you are either standing, or sitting on a firm armless chair.  With a palm down grip, grasp an end of the band in each hand. Bend your elbows 90 degrees and lift your elbows to shoulder height, at your sides. Step back, away from the secured end of the band, until it becomes tense.  Squeezing your shoulder blades together, rotate your shoulders so that your upper arms and elbows remain stationary, but your fists travel upward to head height.  Hold for __________ seconds. Slowly ease the tension on the band, as you reverse the directions and return to the starting position. Repeat __________ times. Complete this exercise __________ times per day.  STRENGTH - Scapular Retractors and External Rotators, Rowing   Secure a rubber exercise band or tubing to a fixed object (table, pole) so that it is at the height of your shoulders, when you are either standing, or sitting on a firm armless chair.  With a palm down grip, grasp an end of the band in each hand. Straighten your elbows and lift your hands straight in front of you, at shoulder height. Step back, away from the secured end of the band, until it becomes tense.  Step 1: Squeeze your shoulder blades together. Bending your elbows, draw your hands to your chest, as if you are rowing a boat. At the end of this motion, your hands and elbow should be at shoulder height and your elbows should be out to your sides.  Step 2: Rotate your shoulders, to raise your hands above your head. Your forearms should be vertical and your upper arms should be horizontal.  Hold for __________ seconds. Slowly ease the tension on the band, as you reverse the directions and return to the starting position. Repeat __________ times. Complete this exercise __________ times per day.  STRENGTH - Scapular  Depressors  Find a sturdy chair without wheels, such as a dining room chair.  Keeping your feet on the floor, and your hands on the chair arms, lift your bottom up from the seat, and lock your elbows.  Keeping your elbows straight, allow gravity to pull your body weight down. Your shoulders will rise toward your ears.  Raise your body against gravity by drawing your shoulder blades down your back, shortening the distance between your shoulders and ears. Although your feet should always maintain contact with the floor, your feet should progressively support less body weight, as you get stronger.  Hold for __________ seconds. In a controlled and slow manner, lower your body weight to begin the next repetition. Repeat __________ times. Complete this exercise __________ times per day.  Document Released: 02/27/2005 Document Revised: 05/22/2011 Document Reviewed: 06/11/2008 [facility] Patient Information 2015 [place], [place]. This information is not intended to replace advice given to you by your health care provider. Make sure you discuss any questions you have with your health care provider.

## 2014-09-07 NOTE — Progress Notes (Signed)
Patient ID: [name], female   DOB: [birth year], 80 y.o.   MRN: [id number]   Location:  [facility]  Code Status: full code Goals of Care: Advanced Directive information Does patient have an advance directive?: No, Would patient like information on creating an advanced directive?: Yes - Educational materials given   Chief Complaint  Patient presents with  . Medical Management of Chronic Issues    extremely exhausted x3 weeks, ? bursitis, wound on legs are looking better.    HPI: Patient is a 80 y.o. white female seen in the office today for f/u on her wounds, bursitis of right shoulder and fatigue.   Went to wound care center and says they scraped all kinds of stuff out of her wounds.  No longer getting ointment, has silver strips on them and covering with soft bandages every other day and sees the [facility] weekly.  Wounds both look much better.  No longer red.  They still burn at times.    Her fatigue is dreadful.  Is off iron due to normal cbc.  Is taking lasix and wonders if that is the cause.  Of note, she is also on hctz longstanding for her bp.  bp is at goal.  Edema is better.  Is down to only 1-2 pain pills per day so does not think they are causing her fatigue.    Still not sleeping well due to stinging in wounds.    Review of Systems:  Review of Systems  Constitutional: Negative for fever and chills.  HENT: Negative for congestion and hearing loss.   Eyes: Negative for blurred vision.  Respiratory: Negative for shortness of breath.        Still smoking  Cardiovascular: Negative for chest pain and leg swelling.  Gastrointestinal: Negative for abdominal pain, constipation, blood in stool and melena.  Genitourinary: Negative for dysuria, urgency and frequency.  Musculoskeletal: Positive for joint pain. Negative for falls.       Right shoulder bothering her most  Skin:       Wounds improving  Neurological: Negative for dizziness  and loss of consciousness.  Psychiatric/Behavioral: Negative for depression and memory loss.    Past Medical History  Diagnosis Date  . Ulcerative colitis   . Hypothyroidism   . Hypertension     pcp   dr Madicyn Mesina   [facility]  . Pneumonia 2008  . Chronic bronchitis 10/31/2011    "prone to it; I have sinus/bronchitis problem probably q yr"  . Multiple skin tears 10/31/2011    "get them very easily; my skin is thin and I bruise easily"  . Chronic back pain     "my entire spine"  . Edema   . Other and unspecified hyperlipidemia   . Encounter for long-term (current) use of other medications   . Senile osteoporosis   . Spinal stenosis, unspecified region other than cervical   . Unspecified glaucoma   . Hypothyroidism   . Complication of anesthesia 2009    htn with block- for hand surgery   . Anxiety     sometimes   . Rheumatoid arthritis(714.0)     degenerative disc disease     Past Surgical History  Procedure Laterality Date  . Back surgery    . Sinus surgery with instatrak    . Thyroidectomy, partial  1997  . Posterior fusion lumbar spine  10/31/2011    L2-3; 3-4  . Tonsillectomy      "  when I was a small child"  . Functional endoscopic sinus surgery  1988  . Posterior fusion lumbar spine  11/2009    L3-4;  L4-5  . Joint replacement  2009; 2006    joints 2 fingers,rt hand; joint left thumb  . Spine surgery    . Posterior lumbar fusion N/A 08/05/13    T1, T2  . Eye surgery    . Anterior cervical decomp/discectomy fusion N/A 02/17/2014    Procedure: CERVICAL FOUR TO FIVE ANTERIOR CERVICAL DECOMPRESSION/DISCECTOMY FUSION 1 LEVEL;  Surgeon: [name], MD;  Location: [place] [facility];  Service: Neurosurgery;  Laterality: N/A;  C4-5 Anterior cervical decompression/diskectomy/fusion  . Cataract extraction Left 11/11/2013  . Cataract extraction Right 12/30/2013  . Total knee arthroplasty Left 06/16/2014    Procedure: TOTAL KNEE ARTHROPLASTY;  Surgeon: Paralee Cancel, MD;  Location:  [facility];  Service: Orthopedics;  Laterality: Left;    No Known Allergies Medications: Patient's Medications  New Prescriptions   No medications on file  Previous Medications   ADALIMUMAB (HUMIRA) 40 MG/0.8ML INJECTION    Inject 0.8 mLs (40 mg total) into the skin every 14 (fourteen) days. Resume ONLY if off all antibiotics   ASPIRIN EC 81 MG TABLET    Take 1 tablet (81 mg total) by mouth once.   BALSALAZIDE (COLAZAL) 750 MG CAPSULE    Take 2,250 mg by mouth 2 (two) times daily.    CALCIUM CARB-CHOLECALCIFEROL (CALCIUM + D3) 600-200 MG-UNIT TABS    Take 1 tablet by mouth daily.   CELEBREX 200 MG CAPSULE    Take 200 mg by mouth 2 (two) times daily.    CHOLECALCIFEROL (VITAMIN D3) 1000 UNITS CAPS    Take 1,000 Units by mouth daily.   DIAZEPAM (VALIUM) 5 MG TABLET    Take 1 tablet (5 mg total) by mouth every 6 (six) hours as needed for muscle spasms.   ESTRADIOL (ESTRACE) 0.5 MG TABLET    Take 0.5 mg by mouth daily.    FOLIC ACID (FOLVITE) 1 MG TABLET    Take 2 mg by mouth daily.   GUAIFENESIN (MUCINEX) 600 MG 12 HR TABLET    Take 1,200 mg by mouth daily as needed (sinus relief).    HYDROCHLOROTHIAZIDE (HYDRODIURIL) 25 MG TABLET    TAKE 1 TABLET (25 MG TOTAL) BY MOUTH DAILY.   HYDROCODONE-ACETAMINOPHEN (NORCO/VICODIN) 5-325 MG PER TABLET    Take 1 tablet by mouth every 6 (six) hours as needed for moderate pain (pain).   LATANOPROST (XALATAN) 0.005 % OPHTHALMIC SOLUTION    Place 1 drop into both eyes at bedtime.   LEFLUNOMIDE (ARAVA) 20 MG TABLET    Take 20 mg by mouth daily.   LEVOTHYROXINE (SYNTHROID) 88 MCG TABLET    Take 88 mcg by mouth daily before breakfast. DAW, DO NOT SUBSTITUTE WITH GENERIC   MEDROXYPROGESTERONE (PROVERA) 2.5 MG TABLET    Take 2.5 mg by mouth at bedtime.    MULTIPLE VITAMIN (MULTIVITAMIN WITH MINERALS) TABS    Take 1 tablet by mouth daily.   OMEGA-3 FATTY ACIDS (FISH OIL) 1200 MG CAPS    Take 1,200 mg by mouth daily.   OXYCODONE-ACETAMINOPHEN (PERCOCET/ROXICET) 5-325 MG  PER TABLET    Take 1-2 tablets by mouth every 8 (eight) hours as needed for severe pain.   PREDNISONE (DELTASONE) 5 MG TABLET    Take 5 mg by mouth daily with breakfast.    SULFAMETHOXAZOLE-TRIMETHOPRIM (BACTRIM DS,SEPTRA DS) 800-160 MG PER TABLET    Take 1  tablet by mouth 2 (two) times daily.  Modified Medications   No medications on file  Discontinued Medications   FERROUS SULFATE 325 (65 FE) MG TABLET    Take 1 tablet (325 mg total) by mouth daily.   FUROSEMIDE (LASIX) 20 MG TABLET    1 BY MOUTH DAILY AS NEEDED   POTASSIUM CHLORIDE SA (K-DUR,KLOR-CON) 20 MEQ TABLET    Take 20 mEq by mouth daily.   SILVER SULFADIAZINE (SILVADENE) 1 % CREAM    Apply 1 application topically daily.    Physical Exam: Filed Vitals:   09/07/14 1532  BP: 120/70  Pulse: 62  Temp: 98.6 F (37 C)  TempSrc: Oral  Resp: 18  Height: 5\' 8"  (1.727 m)  Weight: 127 lb 6.4 oz (57.788 kg)  SpO2: 96%   Physical Exam  Constitutional: She is oriented to person, place, and time.  Thin, white female; ambulates with cane  Cardiovascular: Normal rate, regular rhythm and normal heart sounds.   Pulmonary/Chest: Effort normal and breath sounds normal.  Abdominal: Soft. Bowel sounds are normal.  Musculoskeletal: She exhibits tenderness.  Of right shoulder bursa  Neurological: She is alert and oriented to person, place, and time.  Skin: Skin is warm and dry.  Right medial lower leg wound is now dime-sized with nice pink base, no drainage; left also improved but larger than right; second excoriation laterally to it  Psychiatric: She has a normal mood and affect.    Labs reviewed: Basic Metabolic Panel:  Recent Labs  03/23/14 0854  06/14/14 0425 06/17/14 0430 08/24/14 1119  NA 141  < > 140 141 143  K 3.7  < > 4.0 3.7 4.0  CL 100  < > 107 106 101  CO2 26  < > 27 28 25   GLUCOSE 74  < > 91 99 102*  BUN 22  < > 26* 20 26  CREATININE 0.88  < > 0.82 0.70 0.87  CALCIUM 9.4  < > 7.8* 7.8* 9.3  TSH 2.180  --   --    --  0.457  < > = values in this interval not displayed. Liver Function Tests:  Recent Labs  03/23/14 0854 08/24/14 1119  AST 26 28  ALT 9 12  ALKPHOS 108 156*  BILITOT 0.5 0.3  PROT 5.7* 6.1   No results for input(s): LIPASE, AMYLASE in the last 8760 hours. No results for input(s): AMMONIA in the last 8760 hours. CBC:  Recent Labs  03/23/14 0854 06/13/14 1407 06/14/14 0425 06/17/14 0430 08/24/14 1119  WBC 6.0 9.2 6.1 8.8 7.5  NEUTROABS 2.7  --   --   --  6.1  HGB 13.4 14.8 12.2 11.6*  --   HCT 39.8 44.8 37.7 36.1 37.8  MCV 90 92.4 93.1 94.3  --   PLT 225 218 199 190  --    Lipid Panel:  Recent Labs  03/23/14 0854  CHOL 239*  HDL 86  LDLCALC 130*  TRIG 115  CHOLHDL 2.8   Assessment/Plan 1. Chronic fatigue -suspect this is in fact due to lasix along with hctz--skin appears drier with increased tenting, but cap refill was ok -advised to stop lasix at this time, but, will need to restart if her edema returns OR she gains more than 3# in 2 days (advised to weigh at least qod)--if she does gain weight or get more edema, I would resume lasix 20mg  daily and stop the hctz instead--will then require bp monitoring in cause it increases  2.  Venous ulcer of right leg -improving with wound care--appreciate assistance in management  3. Chronic venous insufficiency -bilaterally; has been better but has been on two diuretics and seems to be getting too dry -K and cr have been ok, but still will try stopping lasix for the time being (see above)  4. Hypothyroidism, unspecified hypothyroidism type -TSH wnl, no changes  5. Shoulder bursitis, right -given some info on this and advised to f/u with her orthopedist who initially told her about it to make sure she is safe to do the exercises on the handout  6. Rheumatoid arthritis -last injection was end of May due to wound infection; now off abx   7. Ulcerative colitis, without complications -again, last injection was end of  May, but is off abx; did not have concerns about diarrhea this time   Next appt:  3 mos  Vernee Baines L. Deena Shaub, D.O. [facility] [address], [place] [address] Cell Phone (Mon-Fri 8am-5pm):  [phone] On Call:  [phone] & follow prompts after 5pm & weekends Office Phone:  [phone] Office Fax:  [phone]

## 2014-09-08 ENCOUNTER — Other Ambulatory Visit: Payer: Self-pay | Admitting: Surgery

## 2014-09-08 ENCOUNTER — Ambulatory Visit (HOSPITAL_COMMUNITY)
Admission: RE | Admit: 2014-09-08 | Discharge: 2014-09-08 | Disposition: A | Discharge status: Home / Self Care | Payer: Medicare Other | Source: Ambulatory Visit | Attending: Vascular Surgery | Admitting: Vascular Surgery

## 2014-09-08 DIAGNOSIS — L97919 Non-pressure chronic ulcer of unspecified part of right lower leg with unspecified severity: Secondary | ICD-10-CM

## 2014-09-08 DIAGNOSIS — L97819 Non-pressure chronic ulcer of other part of right lower leg with unspecified severity: Secondary | ICD-10-CM | POA: Insufficient documentation

## 2014-09-08 DIAGNOSIS — L97829 Non-pressure chronic ulcer of other part of left lower leg with unspecified severity: Secondary | ICD-10-CM | POA: Insufficient documentation

## 2014-09-08 DIAGNOSIS — L97929 Non-pressure chronic ulcer of unspecified part of left lower leg with unspecified severity: Secondary | ICD-10-CM

## 2014-09-09 DIAGNOSIS — L97221 Non-pressure chronic ulcer of left calf limited to breakdown of skin: Secondary | ICD-10-CM | POA: Diagnosis not present

## 2014-09-16 ENCOUNTER — Encounter (HOSPITAL_BASED_OUTPATIENT_CLINIC_OR_DEPARTMENT_OTHER): Payer: Medicare Other | Attending: Surgery

## 2014-09-16 DIAGNOSIS — F418 Other specified anxiety disorders: Secondary | ICD-10-CM | POA: Diagnosis not present

## 2014-09-16 DIAGNOSIS — I890 Lymphedema, not elsewhere classified: Secondary | ICD-10-CM | POA: Diagnosis not present

## 2014-09-16 DIAGNOSIS — H409 Unspecified glaucoma: Secondary | ICD-10-CM | POA: Insufficient documentation

## 2014-09-16 DIAGNOSIS — I10 Essential (primary) hypertension: Secondary | ICD-10-CM | POA: Insufficient documentation

## 2014-09-16 DIAGNOSIS — S81811A Laceration without foreign body, right lower leg, initial encounter: Secondary | ICD-10-CM | POA: Diagnosis not present

## 2014-09-16 DIAGNOSIS — X58XXXA Exposure to other specified factors, initial encounter: Secondary | ICD-10-CM | POA: Diagnosis not present

## 2014-09-16 DIAGNOSIS — L97222 Non-pressure chronic ulcer of left calf with fat layer exposed: Secondary | ICD-10-CM | POA: Diagnosis present

## 2014-09-16 DIAGNOSIS — L97221 Non-pressure chronic ulcer of left calf limited to breakdown of skin: Secondary | ICD-10-CM | POA: Diagnosis not present

## 2014-09-16 DIAGNOSIS — F17218 Nicotine dependence, cigarettes, with other nicotine-induced disorders: Secondary | ICD-10-CM | POA: Diagnosis not present

## 2014-09-16 DIAGNOSIS — L97211 Non-pressure chronic ulcer of right calf limited to breakdown of skin: Secondary | ICD-10-CM | POA: Diagnosis not present

## 2014-09-16 DIAGNOSIS — M069 Rheumatoid arthritis, unspecified: Secondary | ICD-10-CM | POA: Diagnosis not present

## 2014-09-23 DIAGNOSIS — L97211 Non-pressure chronic ulcer of right calf limited to breakdown of skin: Secondary | ICD-10-CM | POA: Diagnosis not present

## 2014-09-25 ENCOUNTER — Inpatient Hospital Stay (HOSPITAL_COMMUNITY)
Admission: AD | Admit: 2014-09-25 | Discharge: 2014-09-28 | DRG: 603 | Disposition: A | Discharge status: Home / Self Care | Payer: Medicare Other | Source: Ambulatory Visit | Attending: Internal Medicine | Admitting: Internal Medicine

## 2014-09-25 ENCOUNTER — Encounter (HOSPITAL_COMMUNITY): Payer: Self-pay

## 2014-09-25 DIAGNOSIS — K51919 Ulcerative colitis, unspecified with unspecified complications: Secondary | ICD-10-CM | POA: Diagnosis not present

## 2014-09-25 DIAGNOSIS — Z96652 Presence of left artificial knee joint: Secondary | ICD-10-CM | POA: Diagnosis present

## 2014-09-25 DIAGNOSIS — E039 Hypothyroidism, unspecified: Secondary | ICD-10-CM | POA: Diagnosis present

## 2014-09-25 DIAGNOSIS — M069 Rheumatoid arthritis, unspecified: Secondary | ICD-10-CM | POA: Diagnosis not present

## 2014-09-25 DIAGNOSIS — I10 Essential (primary) hypertension: Secondary | ICD-10-CM | POA: Diagnosis present

## 2014-09-25 DIAGNOSIS — Z7952 Long term (current) use of systemic steroids: Secondary | ICD-10-CM

## 2014-09-25 DIAGNOSIS — K5190 Ulcerative colitis, unspecified, without complications: Secondary | ICD-10-CM | POA: Diagnosis not present

## 2014-09-25 DIAGNOSIS — R600 Localized edema: Secondary | ICD-10-CM | POA: Diagnosis present

## 2014-09-25 DIAGNOSIS — Z87891 Personal history of nicotine dependence: Secondary | ICD-10-CM

## 2014-09-25 DIAGNOSIS — Z801 Family history of malignant neoplasm of trachea, bronchus and lung: Secondary | ICD-10-CM

## 2014-09-25 DIAGNOSIS — M810 Age-related osteoporosis without current pathological fracture: Secondary | ICD-10-CM | POA: Diagnosis present

## 2014-09-25 DIAGNOSIS — H409 Unspecified glaucoma: Secondary | ICD-10-CM | POA: Diagnosis present

## 2014-09-25 DIAGNOSIS — M7989 Other specified soft tissue disorders: Secondary | ICD-10-CM | POA: Diagnosis present

## 2014-09-25 DIAGNOSIS — Z7982 Long term (current) use of aspirin: Secondary | ICD-10-CM | POA: Diagnosis not present

## 2014-09-25 DIAGNOSIS — Z79899 Other long term (current) drug therapy: Secondary | ICD-10-CM

## 2014-09-25 DIAGNOSIS — Z79891 Long term (current) use of opiate analgesic: Secondary | ICD-10-CM

## 2014-09-25 DIAGNOSIS — E876 Hypokalemia: Secondary | ICD-10-CM | POA: Diagnosis present

## 2014-09-25 DIAGNOSIS — L0390 Cellulitis, unspecified: Secondary | ICD-10-CM | POA: Diagnosis present

## 2014-09-25 DIAGNOSIS — Z8249 Family history of ischemic heart disease and other diseases of the circulatory system: Secondary | ICD-10-CM | POA: Diagnosis not present

## 2014-09-25 DIAGNOSIS — N179 Acute kidney failure, unspecified: Secondary | ICD-10-CM | POA: Diagnosis not present

## 2014-09-25 DIAGNOSIS — K51918 Ulcerative colitis, unspecified with other complication: Secondary | ICD-10-CM | POA: Diagnosis not present

## 2014-09-25 DIAGNOSIS — Z8701 Personal history of pneumonia (recurrent): Secondary | ICD-10-CM

## 2014-09-25 DIAGNOSIS — F419 Anxiety disorder, unspecified: Secondary | ICD-10-CM | POA: Diagnosis present

## 2014-09-25 DIAGNOSIS — L03116 Cellulitis of left lower limb: Secondary | ICD-10-CM | POA: Diagnosis present

## 2014-09-25 DIAGNOSIS — R197 Diarrhea, unspecified: Secondary | ICD-10-CM | POA: Diagnosis not present

## 2014-09-25 DIAGNOSIS — E785 Hyperlipidemia, unspecified: Secondary | ICD-10-CM | POA: Diagnosis present

## 2014-09-25 LAB — BASIC METABOLIC PANEL
Anion gap: 11 (ref 5–15)
BUN: 35 mg/dL — AB (ref 6–20)
CO2: 29 mmol/L (ref 22–32)
Calcium: 8.3 mg/dL — ABNORMAL LOW (ref 8.9–10.3)
Chloride: 95 mmol/L — ABNORMAL LOW (ref 101–111)
Creatinine, Ser: 0.92 mg/dL (ref 0.44–1.00)
GFR calc Af Amer: >60 mL/min (ref >60)
GFR, EST NON AFRICAN AMERICAN: 59 mL/min — AB (ref >60)
Glucose, Bld: 117 mg/dL — ABNORMAL HIGH (ref 65–99)
POTASSIUM: 3 mmol/L — AB (ref 3.5–5.1)
Sodium: 135 mmol/L (ref 135–145)

## 2014-09-25 LAB — CBC
HEMATOCRIT: 34.7 % — AB (ref 36.0–46.0)
HEMOGLOBIN: 11.3 g/dL — AB (ref 12.0–15.0)
MCH: 28.5 pg (ref 26.0–34.0)
MCHC: 32.6 g/dL (ref 30.0–36.0)
MCV: 87.6 fL (ref 78.0–100.0)
PLATELETS: 334 10*3/uL (ref 150–400)
RBC: 3.96 MIL/uL (ref 3.87–5.11)
RDW: 14.5 % (ref 11.5–15.5)
WBC: 11 10*3/uL — AB (ref 4.0–10.5)

## 2014-09-25 MED ORDER — LEFLUNOMIDE 20 MG PO TABS
20.0000 mg | ORAL_TABLET | Freq: Every day | ORAL | Status: DC
  Administered 2014-09-25 – 2014-09-28 (×4): 20 mg via ORAL
  Filled 2014-09-25 (×4): qty 1

## 2014-09-25 MED ORDER — LEVOTHYROXINE SODIUM 88 MCG PO TABS
88.0000 ug | ORAL_TABLET | Freq: Every day | ORAL | Status: DC
  Administered 2014-09-27 – 2014-09-28 (×2): 88 ug via ORAL
  Filled 2014-09-25 (×4): qty 1

## 2014-09-25 MED ORDER — HEPARIN SODIUM (PORCINE) 5000 UNIT/ML IJ SOLN
5000.0000 [IU] | Freq: Three times a day (TID) | INTRAMUSCULAR | Status: DC
  Administered 2014-09-25 – 2014-09-28 (×8): 5000 [IU] via SUBCUTANEOUS
  Filled 2014-09-25 (×11): qty 1

## 2014-09-25 MED ORDER — FOLIC ACID 1 MG PO TABS
2.0000 mg | ORAL_TABLET | Freq: Every day | ORAL | Status: DC
  Administered 2014-09-26 – 2014-09-28 (×3): 2 mg via ORAL
  Filled 2014-09-25 (×4): qty 2

## 2014-09-25 MED ORDER — ADULT MULTIVITAMIN W/MINERALS CH
1.0000 | ORAL_TABLET | Freq: Every day | ORAL | Status: DC
  Administered 2014-09-26 – 2014-09-28 (×3): 1 via ORAL
  Filled 2014-09-25 (×4): qty 1

## 2014-09-25 MED ORDER — OXYCODONE-ACETAMINOPHEN 5-325 MG PO TABS
1.0000 | ORAL_TABLET | Freq: Four times a day (QID) | ORAL | Status: DC | PRN
  Administered 2014-09-25 – 2014-09-26 (×4): 1 via ORAL
  Administered 2014-09-26: 2 via ORAL
  Administered 2014-09-26 – 2014-09-27 (×2): 1 via ORAL
  Administered 2014-09-27 (×2): 2 via ORAL
  Administered 2014-09-28: 1 via ORAL
  Administered 2014-09-28: 2 via ORAL
  Filled 2014-09-25 (×2): qty 1
  Filled 2014-09-25 (×2): qty 2
  Filled 2014-09-25 (×5): qty 1
  Filled 2014-09-25 (×3): qty 2

## 2014-09-25 MED ORDER — ESTRADIOL 1 MG PO TABS
0.5000 mg | ORAL_TABLET | Freq: Every day | ORAL | Status: DC
  Administered 2014-09-25 – 2014-09-28 (×4): 0.5 mg via ORAL
  Filled 2014-09-25 (×4): qty 0.5

## 2014-09-25 MED ORDER — BALSALAZIDE DISODIUM 750 MG PO CAPS
2250.0000 mg | ORAL_CAPSULE | Freq: Two times a day (BID) | ORAL | Status: DC
  Administered 2014-09-25 – 2014-09-28 (×6): 2250 mg via ORAL
  Filled 2014-09-25 (×7): qty 3

## 2014-09-25 MED ORDER — MEDROXYPROGESTERONE ACETATE 2.5 MG PO TABS
2.5000 mg | ORAL_TABLET | Freq: Every day | ORAL | Status: DC
  Administered 2014-09-25 – 2014-09-27 (×3): 2.5 mg via ORAL
  Filled 2014-09-25 (×4): qty 1

## 2014-09-25 MED ORDER — SODIUM CHLORIDE 0.9 % IV SOLN
INTRAVENOUS | Status: DC
  Administered 2014-09-25: 22:00:00 via INTRAVENOUS

## 2014-09-25 MED ORDER — LATANOPROST 0.005 % OP SOLN
1.0000 [drp] | Freq: Every day | OPHTHALMIC | Status: DC
  Administered 2014-09-25 – 2014-09-27 (×3): 1 [drp] via OPHTHALMIC
  Filled 2014-09-25: qty 2.5

## 2014-09-25 MED ORDER — PREDNISONE 5 MG PO TABS
5.0000 mg | ORAL_TABLET | Freq: Every day | ORAL | Status: DC
  Administered 2014-09-26 – 2014-09-28 (×3): 5 mg via ORAL
  Filled 2014-09-25 (×4): qty 1

## 2014-09-25 MED ORDER — DIAZEPAM 5 MG PO TABS
5.0000 mg | ORAL_TABLET | Freq: Four times a day (QID) | ORAL | Status: DC | PRN
  Administered 2014-09-26 – 2014-09-27 (×4): 5 mg via ORAL
  Filled 2014-09-25 (×4): qty 1

## 2014-09-25 MED ORDER — SACCHAROMYCES BOULARDII 250 MG PO CAPS
250.0000 mg | ORAL_CAPSULE | Freq: Two times a day (BID) | ORAL | Status: DC
  Administered 2014-09-25 – 2014-09-28 (×6): 250 mg via ORAL
  Filled 2014-09-25 (×7): qty 1

## 2014-09-25 MED ORDER — POTASSIUM CHLORIDE CRYS ER 20 MEQ PO TBCR
40.0000 meq | EXTENDED_RELEASE_TABLET | ORAL | Status: AC
  Administered 2014-09-25 – 2014-09-26 (×2): 40 meq via ORAL
  Filled 2014-09-25 (×2): qty 2

## 2014-09-25 MED ORDER — CELECOXIB 200 MG PO CAPS
200.0000 mg | ORAL_CAPSULE | Freq: Two times a day (BID) | ORAL | Status: DC
  Administered 2014-09-25 – 2014-09-26 (×2): 200 mg via ORAL
  Filled 2014-09-25 (×3): qty 1

## 2014-09-25 MED ORDER — ASPIRIN EC 81 MG PO TBEC
81.0000 mg | DELAYED_RELEASE_TABLET | Freq: Once | ORAL | Status: AC
  Administered 2014-09-25: 81 mg via ORAL
  Filled 2014-09-25: qty 1

## 2014-09-25 MED ORDER — CLINDAMYCIN PHOSPHATE 900 MG/50ML IV SOLN
900.0000 mg | Freq: Three times a day (TID) | INTRAVENOUS | Status: DC
  Administered 2014-09-25 – 2014-09-28 (×8): 900 mg via INTRAVENOUS
  Filled 2014-09-25 (×9): qty 50

## 2014-09-25 NOTE — H&P (Addendum)
[facility] History and Physical  [name] UUV:[id number] DOB: Jul 13, 1934 DOA: 09/25/2014   PCP: Hollace Kinnier, DO    Chief Complaint: swelling and redness left leg  HPI: [name] is a 80 y.o. female with rheumatoid arthritis, hypertension comes from Dr. Aurea Graff office for severe cellulitis. She underwent a left total knee arthroplasty on 4/7 for advanced joint disease in relation to rheumatoid arthritis and a recent tibial plateau fracture. She had cellulitis of he left leg about 1 month ago and received a 10 day course of Bactrim which resolved it.  She developed redness and swelling again 2 wks ago and was seen in the wound care center and referred to Dr Alvan Dame. Dr Alvan Dame referred her for a direct admission as the cellulitis extends from her groin down the medial leg to the foot. She has not had any antibiotics since the course of bactrim the leg is swollen and weeping. No significant pain in the knee where she had the surgery.  General: The patient denies anorexia, fever, weight loss Cardiac: Denies chest pain, syncope, palpitations, pedal edema  Respiratory: Denies cough, shortness of breath, wheezing GI: Denies severe indigestion/heartburn, abdominal pain, nausea, vomiting, diarrhea and constipation GU: Denies hematuria, incontinence, dysuria  Musculoskeletal: mild pain in left leg mostly at night- uses percocet BID Skin: see HPI Neurologic: Denies focal weakness or numbness, change in vision Psychiatry: Denies depression or anxiety. Hematologic: no easy bruising or bleeding  All other systems reviewed and found to be negative.  Past Medical History  Diagnosis Date  . Ulcerative colitis   . Hypothyroidism   . Hypertension     pcp   dr reed   [facility]  . Pneumonia 2008  . Chronic bronchitis 10/31/2011    "prone to it; I have sinus/bronchitis problem probably q yr"  . Multiple skin tears 10/31/2011    "get them very easily; my skin is thin and I bruise  easily"  . Chronic back pain     "my entire spine"  . Edema   . Other and unspecified hyperlipidemia   . Encounter for long-term (current) use of other medications   . Senile osteoporosis   . Spinal stenosis, unspecified region other than cervical   . Unspecified glaucoma   . Hypothyroidism   . Complication of anesthesia 2009    htn with block- for hand surgery   . Anxiety     sometimes   . Rheumatoid arthritis(714.0)     degenerative disc disease     Past Surgical History  Procedure Laterality Date  . Back surgery    . Sinus surgery with instatrak    . Thyroidectomy, partial  1997  . Posterior fusion lumbar spine  10/31/2011    L2-3; 3-4  . Tonsillectomy      "when I was a small child"  . Functional endoscopic sinus surgery  1988  . Posterior fusion lumbar spine  11/2009    L3-4;  L4-5  . Joint replacement  2009; 2006    joints 2 fingers,rt hand; joint left thumb  . Spine surgery    . Posterior lumbar fusion N/A 08/05/13    T1, T2  . Eye surgery    . Anterior cervical decomp/discectomy fusion N/A 02/17/2014    Procedure: CERVICAL FOUR TO FIVE ANTERIOR CERVICAL DECOMPRESSION/DISCECTOMY FUSION 1 LEVEL;  Surgeon: [name], MD;  Location: [place] [facility];  Service: Neurosurgery;  Laterality: N/A;  C4-5 Anterior cervical decompression/diskectomy/fusion  . Cataract extraction Left 11/11/2013  .  Cataract extraction Right 12/30/2013  . Total knee arthroplasty Left 06/16/2014    Procedure: TOTAL KNEE ARTHROPLASTY;  Surgeon: Paralee Cancel, MD;  Location: [facility];  Service: Orthopedics;  Laterality: Left;    Social History: stopped smoking 3 wks ago officially but took a few puffs today, drinks alcohol occasionally  Lives at home with husbannd    No Known Allergies  Family history:   Family History  Problem Relation Age of Onset  . Heart disease Mother   . Cancer Father     lung      Prior to Admission medications   Medication Sig Start Date End Date Taking? Authorizing  Provider  adalimumab (HUMIRA) 40 MG/0.8ML injection Inject 0.8 mLs (40 mg total) into the skin every 14 (fourteen) days. Resume ONLY if off all antibiotics 11/24/11   Jonetta Osgood, MD  aspirin EC 81 MG tablet Take 1 tablet (81 mg total) by mouth once. 08/19/14   Estill Dooms, MD  balsalazide (COLAZAL) 750 MG capsule Take 2,250 mg by mouth 2 (two) times daily.     Historical Provider, MD  Calcium Carb-Cholecalciferol (CALCIUM + D3) 600-200 MG-UNIT TABS Take 1 tablet by mouth daily.    Historical Provider, MD  CELEBREX 200 MG capsule Take 200 mg by mouth 2 (two) times daily.  07/15/12   Historical Provider, MD  Cholecalciferol (VITAMIN D3) 1000 UNITS CAPS Take 1,000 Units by mouth daily.    Historical Provider, MD  diazepam (VALIUM) 5 MG tablet Take 1 tablet (5 mg total) by mouth every 6 (six) hours as needed for muscle spasms. 06/17/14   Barton Dubois, MD  estradiol (ESTRACE) 0.5 MG tablet Take 0.5 mg by mouth daily.  06/18/12   Historical Provider, MD  folic acid (FOLVITE) 1 MG tablet Take 2 mg by mouth daily.    Historical Provider, MD  guaiFENesin (MUCINEX) 600 MG 12 hr tablet Take 1,200 mg by mouth daily as needed (sinus relief).     Historical Provider, MD  hydrochlorothiazide (HYDRODIURIL) 25 MG tablet TAKE 1 TABLET (25 MG TOTAL) BY MOUTH DAILY. 08/24/14   [name] L [name], DO  HYDROcodone-acetaminophen (NORCO/VICODIN) 5-325 MG per tablet Take 1 tablet by mouth every 6 (six) hours as needed for moderate pain (pain). 06/17/14   Barton Dubois, MD  latanoprost (XALATAN) 0.005 % ophthalmic solution Place 1 drop into both eyes at bedtime.    Historical Provider, MD  leflunomide (ARAVA) 20 MG tablet Take 20 mg by mouth daily. 05/07/14   Historical Provider, MD  levothyroxine (SYNTHROID) 88 MCG tablet Take 88 mcg by mouth daily before breakfast. DAW, DO NOT SUBSTITUTE WITH GENERIC    Historical Provider, MD  medroxyPROGESTERone (PROVERA) 2.5 MG tablet Take 2.5 mg by mouth at bedtime.  06/02/13   Historical  Provider, MD  Multiple Vitamin (MULTIVITAMIN WITH MINERALS) TABS Take 1 tablet by mouth daily.    Historical Provider, MD  Omega-3 Fatty Acids (FISH OIL) 1200 MG CAPS Take 1,200 mg by mouth daily.    Historical Provider, MD  oxyCODONE-acetaminophen (PERCOCET/ROXICET) 5-325 MG per tablet Take 1-2 tablets by mouth every 8 (eight) hours as needed for severe pain. Patient taking differently: Take 1-2 tablets by mouth every 6 (six) hours as needed for severe pain.  06/17/14   Barton Dubois, MD  predniSONE (DELTASONE) 5 MG tablet Take 5 mg by mouth daily with breakfast.     Historical Provider, MD  sulfamethoxazole-trimethoprim (BACTRIM DS,SEPTRA DS) 800-160 MG per tablet Take 1 tablet by mouth 2 (  two) times daily. 08/27/14   Gayland Curry, DO     Physical Exam: Filed Vitals:   09/25/14 1500  BP: 157/69  Pulse: 65  Temp: 99.4 F (37.4 C)  TempSrc: Oral  Resp: 14  Height: 5\' 8"  (1.727 m)  Weight: 59.2 kg (130 lb 8.2 oz)  SpO2: 100%     General: AAO x 3 HEENT: Normocephalic and Atraumatic, Mucous membranes pink                PERRLA; EOM intact; No scleral icterus,                 Nares: Patent, Oropharynx: Clear, Fair Dentition                 Neck: FROM, no cervical lymphadenopathy, thyromegaly, carotid bruit or JVD;  Breasts: deferred CHEST WALL: No tenderness  CHEST: Normal respiration, clear to auscultation bilaterally  HEART: Regular rate and rhythm; no murmurs rubs or gallops  BACK: No kyphosis or scoliosis; no CVA tenderness  GI: Positive Bowel Sounds, soft, non-tender; no masses, no organomegaly Rectal Exam: deferred MSK: No cyanosis, clubbing, or edema- distortion of hands/ feet due to rheumatoid arthritis Genitalia: not examined  SKIN:  Severe erythema and swelling of left foot, erythema extends up left leg (full circumference) involving the knee and then medially up the thigh to the groin- swelling of entire leg - knee not tender- chronic wounds on left leg present CNS:  Alert and Oriented x 4, Nonfocal exam, CN 2-12 intact  Labs on Admission:  Basic Metabolic Panel: No results for input(s): NA, K, CL, CO2, GLUCOSE, BUN, CREATININE, CALCIUM, MG, PHOS in the last 168 hours. Liver Function Tests: No results for input(s): AST, ALT, ALKPHOS, BILITOT, PROT, ALBUMIN in the last 168 hours. No results for input(s): LIPASE, AMYLASE in the last 168 hours. No results for input(s): AMMONIA in the last 168 hours. CBC: No results for input(s): WBC, NEUTROABS, HGB, HCT, MCV, PLT in the last 168 hours. Cardiac Enzymes: No results for input(s): CKTOTAL, CKMB, CKMBINDEX, TROPONINI in the last 168 hours.  BNP (last 3 results) No results for input(s): BNP in the last 8760 hours.  ProBNP (last 3 results) No results for input(s): PROBNP in the last 8760 hours.  CBG: No results for input(s): GLUCAP in the last 168 hours.  Radiological Exams on Admission: No results found.    Assessment/Plan Principal Problem:   Cellulitis- extensive - start Clindamycin  - obtain blood cultures  - Percocet for pain has been sufficient for her- also takes Celebrex which can be continued  Active Problems:  Chronic wounds left leg/ swelling / drainage to clear fluid - likely the source of above cellulitis - cont home wound care orders, wrap leg with ACE after re-in forcing draining areas - wound care consult    S/P left TKA - does not appear to have septic arthtitis    HTN (hypertension) - cont HCTZ    Rheumatoid arthritis - Humira on hold due to recent cellulitis- will cont to hold - cont Arava and Prednisone to prevent a flare    Ulcerative colitis, chronic - controlled  - cont Balsalazide   MED REC STILL NEEDS TO BE UPDATED- LABS STILL ALL PENDING   Consulted:   Code Status: Full  Family Communication:   DVT Prophylaxis:Heparin  Time spent: 58 MIN  [place], MD [facility]  If 7PM-7AM, please contact night-coverage [email or web address] 09/25/2014,  4:32 PM

## 2014-09-26 DIAGNOSIS — E876 Hypokalemia: Secondary | ICD-10-CM

## 2014-09-26 DIAGNOSIS — N179 Acute kidney failure, unspecified: Secondary | ICD-10-CM

## 2014-09-26 DIAGNOSIS — K5190 Ulcerative colitis, unspecified, without complications: Secondary | ICD-10-CM

## 2014-09-26 LAB — BASIC METABOLIC PANEL
Anion gap: 8 (ref 5–15)
BUN: 32 mg/dL — AB (ref 6–20)
CHLORIDE: 101 mmol/L (ref 101–111)
CO2: 29 mmol/L (ref 22–32)
Calcium: 7.5 mg/dL — ABNORMAL LOW (ref 8.9–10.3)
Creatinine, Ser: 1.14 mg/dL — ABNORMAL HIGH (ref 0.44–1.00)
GFR, EST AFRICAN AMERICAN: 52 mL/min — AB (ref >60)
GFR, EST NON AFRICAN AMERICAN: 45 mL/min — AB (ref >60)
Glucose, Bld: 90 mg/dL (ref 65–99)
POTASSIUM: 4.4 mmol/L (ref 3.5–5.1)
Sodium: 138 mmol/L (ref 135–145)

## 2014-09-26 LAB — CBC
HCT: 29.5 % — ABNORMAL LOW (ref 36.0–46.0)
Hemoglobin: 9.7 g/dL — ABNORMAL LOW (ref 12.0–15.0)
MCH: 29 pg (ref 26.0–34.0)
MCHC: 32.9 g/dL (ref 30.0–36.0)
MCV: 88.3 fL (ref 78.0–100.0)
Platelets: 321 10*3/uL (ref 150–400)
RBC: 3.34 MIL/uL — ABNORMAL LOW (ref 3.87–5.11)
RDW: 14.9 % (ref 11.5–15.5)
WBC: 9 10*3/uL (ref 4.0–10.5)

## 2014-09-26 MED ORDER — COLLAGENASE 250 UNIT/GM EX OINT
TOPICAL_OINTMENT | Freq: Every day | CUTANEOUS | Status: DC
  Administered 2014-09-26 – 2014-09-28 (×3): via TOPICAL
  Filled 2014-09-26: qty 30

## 2014-09-26 MED ORDER — SODIUM CHLORIDE 0.9 % IV SOLN
INTRAVENOUS | Status: DC
  Administered 2014-09-26 – 2014-09-27 (×3): 100 mL/h via INTRAVENOUS

## 2014-09-26 NOTE — Evaluation (Signed)
Physical Therapy Evaluation Patient Details Name: [name] MRN: [id number] DOB: 12-Dec-1934 Today's Date: 09/26/2014   History of Present Illness  80 yo female adm with LLE cellulitis; PMHx:  L TKA 06/16/14, back surgeries, RA  Clinical Impression  Pt admitted with above diagnosis. Pt currently with functional limitations due to the deficits listed below (see PT Problem List).  Pt will benefit from skilled PT to increase their independence and safety with mobility to allow discharge to the venue listed below.  Should not need post acute f/u; will follow this venue for gait and balance     Follow Up Recommendations No PT follow up    Equipment Recommendations  None recommended by PT    Recommendations for Other Services       Precautions / Restrictions Precautions Precautions: Knee;Fall      Mobility  Bed Mobility Overal bed mobility: Needs Assistance Bed Mobility: Supine to Sit     Supine to sit: Supervision        Transfers Overall transfer level: Needs assistance Equipment used: Straight cane Transfers: Sit to/from Stand Sit to Stand: Min guard         General transfer comment: cues foro hand placement  Ambulation/Gait Ambulation/Gait assistance: Min guard Ambulation Distance (Feet): 380 Feet Assistive device: Straight cane Gait Pattern/deviations: Step-through pattern;Trunk flexed;Drifts right/left;Wide base of support     General Gait Details: LOB x 1 with min to recover; (pt was amb without use of her cane at home prior to adm)  Science writer    Modified Rankin (Stroke Patients Only)       Balance Overall balance assessment: Needs assistance           Standing balance-Leahy Scale: Fair               High level balance activites: Turns;Head turns High Level Balance Comments: LOB x 1             Pertinent Vitals/Pain Pain Assessment: 0-10 Pain Score: 4  Pain Location: LLE Pain Descriptors  / Indicators: Grimacing;Jabbing Pain Intervention(s): Limited activity within patient's tolerance;Monitored during session;Premedicated before session;Repositioned    Home Living Family/patient expects to be discharged to:: Private residence Living Arrangements: Spouse/significant other Available Help at Discharge: Family Type of Home: House Home Access: Stairs to enter Entrance Stairs-Rails: Psychiatric nurse of Steps: 5 or 3 Home Layout: Multi-level;Bed/bath upstairs Home Equipment: Environmental consultant - 2 wheels;Toilet riser;Shower seat;Cane - single point Additional Comments: most recently using cane when going out, no AD in the house    Prior Function                 Hand Dominance        Extremity/Trunk Assessment   Upper Extremity Assessment: Generalized weakness           Lower Extremity Assessment: Generalized weakness (excessive pronation/deformities bil feet d/t  RA)         Communication   Communication: No difficulties  Cognition Arousal/Alertness: Awake/alert Behavior During Therapy: WFL for tasks assessed/performed Overall Cognitive Status: Within Functional Limits for tasks assessed                      General Comments      Exercises        Assessment/Plan    PT Assessment Patient needs continued PT services  PT Diagnosis Difficulty walking   PT Problem List Decreased balance;Decreased  mobility;Decreased activity tolerance  PT Treatment Interventions DME instruction;Gait training;Functional mobility training;Therapeutic exercise;Patient/family education   PT Goals (Current goals can be found in the Care Plan section) Acute Rehab PT Goals Patient Stated Goal: less pain, back to amb without cane PT Goal Formulation: With patient Time For Goal Achievement: 10/03/14 Potential to Achieve Goals: Good    Frequency Min 3X/week   Barriers to discharge        Co-evaluation               End of Session Equipment  Utilized During Treatment: Gait belt Activity Tolerance: Patient tolerated treatment well Patient left: in chair;with call bell/phone within reach           Time: 1017-1043 PT Time Calculation (min) (ACUTE ONLY): 26 min   Charges:   PT Evaluation $Initial PT Evaluation Tier I: 1 Procedure PT Treatments $Gait Training: 8-22 mins   PT G Codes:        Keane Martelli 10-23-2014, 11:21 AM

## 2014-09-26 NOTE — Progress Notes (Deleted)
Pt took own dose of Synthroid 88 mcg from home.  Dose sent from  pharmacy was 137 mcg

## 2014-09-26 NOTE — Progress Notes (Signed)
[facility] Progress Note   [name] WNI:[id number] DOB: [birth year] DOA: 09/25/2014 PCP: Hollace Kinnier, DO  Brief narrative: [name] is a 80 y.o. female with rheumatoid arthritis, hypertension comes from Dr. Aurea Graff office for severe cellulitis. She underwent a left total knee arthroplasty on 4/7 for advanced joint disease in relation to rheumatoid arthritis and a recent tibial plateau fracture. She had cellulitis of he left leg about 1 month ago and received a 10 day course of Bactrim which resolved it.  She developed redness and swelling again 2 wks ago and was seen in the wound care center and referred to Dr Alvan Dame. Dr Alvan Dame referred her for a direct admission as the cellulitis extends from her groin down the medial leg to the foot. She has not had any antibiotics since the course of bactrim the leg is swollen and weeping. No significant pain in the knee where she had the surgery.   Subjective: No c/o pain, nausea, vomiting or diarrhea.   Assessment/Plan: Principal Problem:   Cellulitis - cont Clindamycin- only mild improvement so far  Active Problems:  Chronic wounds left leg/ swelling / drainage to clear fluid - likely the source of above cellulitis - wound care consulted   S/P left TKA - does not appear to have septic arthtitis   HTN (hypertension) - holding HCTZ  ARf - cr has risen today- start IVF- cont to hold HCTZ- hold celebrex   Rheumatoid arthritis - Humira on hold due to recent cellulitis- will cont to hold - cont Arava and Prednisone to prevent a flare   Ulcerative colitis, chronic - controlled  - cont Balsalazide  Hypokalemia - replaced   Code Status: full Family Communication:  Disposition Plan:  DVT prophylaxis: heparin Consultants: Procedures:  Antibiotics: Anti-infectives    Start     Dose/Rate Route Frequency Ordered Stop   09/25/14 1800  clindamycin (CLEOCIN) IVPB 900 mg     900 mg 100 mL/hr over 30 Minutes  Intravenous 3 times per day 09/25/14 1634        Objective: Filed Weights   09/25/14 1500  Weight: 59.2 kg (130 lb 8.2 oz)    Intake/Output Summary (Last 24 hours) at 09/26/14 1631 Last data filed at 09/26/14 1300  Gross per 24 hour  Intake 1023.5 ml  Output    600 ml  Net  423.5 ml     Vitals Filed Vitals:   09/25/14 1500 09/25/14 2108 09/26/14 0500 09/26/14 1400  BP: 157/69 144/75 112/68 122/68  Pulse: 65 87 62 65  Temp: 99.4 F (37.4 C) 99 F (37.2 C) 98 F (36.7 C) 99.1 F (37.3 C)  TempSrc: Oral Oral Oral Oral  Resp: 14 16 17 18   Height: 5\' 8"  (1.727 m)     Weight: 59.2 kg (130 lb 8.2 oz)     SpO2: 100% 99% 100% 97%    Exam:  General:  Pt is alert, not in acute distress  HEENT: No icterus, No thrush, oral mucosa moist  Cardiovascular: regular rate and rhythm, S1/S2 No murmur  Respiratory: clear to auscultation bilaterally   Abdomen: Soft, +Bowel sounds, non tender, non distended, no guarding  MSK: No LE edema, cyanosis or clubbing  Skin: Severe erythema and swelling of left foot, erythema extends up left leg (full circumference) involving the knee and then medially up the thigh to the groin- swelling of entire leg - knee not tender- chronic wounds on left leg present  Data Reviewed: Basic Metabolic Panel:  Recent  Labs Lab 09/25/14 1630 09/26/14 0507  NA 135 138  K 3.0* 4.4  CL 95* 101  CO2 29 29  GLUCOSE 117* 90  BUN 35* 32*  CREATININE 0.92 1.14*  CALCIUM 8.3* 7.5*   Liver Function Tests: No results for input(s): AST, ALT, ALKPHOS, BILITOT, PROT, ALBUMIN in the last 168 hours. No results for input(s): LIPASE, AMYLASE in the last 168 hours. No results for input(s): AMMONIA in the last 168 hours. CBC:  Recent Labs Lab 09/25/14 1630 09/26/14 0507  WBC 11.0* 9.0  HGB 11.3* 9.7*  HCT 34.7* 29.5*  MCV 87.6 88.3  PLT 334 321   Cardiac Enzymes: No results for input(s): CKTOTAL, CKMB, CKMBINDEX, TROPONINI in the last 168 hours. BNP  (last 3 results) No results for input(s): BNP in the last 8760 hours.  ProBNP (last 3 results) No results for input(s): PROBNP in the last 8760 hours.  CBG: No results for input(s): GLUCAP in the last 168 hours.  Recent Results (from the past 240 hour(s))  Culture, blood (routine x 2)     Status: None (Preliminary result)   Collection Time: 09/25/14  4:30 PM  Result Value Ref Range Status   Specimen Description BLOOD RIGHT ARM  Final   Special Requests BOTTLES DRAWN AEROBIC AND ANAEROBIC [place]  Final   Culture   Final    NO GROWTH < 24 HOURS Performed at [facility]    Report Status PENDING  Incomplete  Culture, blood (routine x 2)     Status: None (Preliminary result)   Collection Time: 09/25/14  4:40 PM  Result Value Ref Range Status   Specimen Description BLOOD LARM  Final   Special Requests BOTTLES DRAWN AEROBIC AND ANAEROBIC 10CC  Final   Culture   Final    NO GROWTH < 24 HOURS Performed at [facility]    Report Status PENDING  Incomplete     Studies: No results found.  Scheduled Meds:  Scheduled Meds: . balsalazide  2,250 mg Oral BID  . celecoxib  200 mg Oral BID  . clindamycin (CLEOCIN) IV  900 mg Intravenous 3 times per day  . collagenase   Topical Daily  . estradiol  0.5 mg Oral Daily  . folic acid  2 mg Oral Daily  . heparin  5,000 Units Subcutaneous 3 times per day  . latanoprost  1 drop Both Eyes QHS  . leflunomide  20 mg Oral Daily  . medroxyPROGESTERone  2.5 mg Oral QHS  . multivitamin with minerals  1 tablet Oral Daily  . predniSONE  5 mg Oral Q breakfast  . saccharomyces boulardii  250 mg Oral BID  . levothyroxine  88 mcg Oral QAC breakfast   Continuous Infusions: . sodium chloride 10 mL/hr at 09/25/14 2139    Time spent on care of this patient: 51 min   [place], MD 09/26/2014, 4:31 PM  LOS: 1 day   [facility] Office  [phone] Pager - Text Page per [email or web address] If 7PM-7AM, please contact night-coverage  [email or web address]

## 2014-09-26 NOTE — Consult Note (Signed)
WOC wound consult note Reason for Consult:Chronic wounds on LLE Wound type: Suspect mixed etiology, venous and arterial Pressure Ulcer POA: No Measurement:left lateral LE:  1.5cm x 0.4cm x 0.2cm with pink wound bed and scant serous drainage.  Left anterior LE (pretibial), proximal: 3cm x 2cm with depth indeterminate due to the presence of necrotic yellow slough.  Serous to light yellow drainage.  Left anterior LE (pretibial), distal:  3.5cm x 1.5cm x 0.4cm with <10% slough.  Patient with edema and toe deformities, resulting in moisture associated dermatitis (MASD) of the interdigital spaces. Wound bed:As described above. Drainage (amount, consistency, odor) strong odor of LE wounds due to not having been changed since Wednesday.  Odor consistent with MASD from between toes. Periwound: intact, with evidence of resolving edema except for right anterior foot. Dressing procedure/placement/frequency: I will place orders today for an antimicrobial textile to be used between the patients toes after washing and drying thoroughly, change daily.  For the chronic wounds on the left LE, I will implement santyl ointment once daily until the wound bed is clean/free of necrotic tissue.  Following that, an absorbant dressing will be indicated e.g. Aquacel Ag+ (silver hydrofiber).  Patient will return to the outpatient wound care clinic at [facility] upon discharge for follow up. May need follow up referral to [facility] for evaluation and work up for arterial insufficiency. [place] nursing team will not follow, but will remain available to this patient, the nursing and medical teams.  Please re-consult if needed. Thanks, Maudie Flakes, MSN, RN, [place], [place], [place] [phone])

## 2014-09-27 DIAGNOSIS — R197 Diarrhea, unspecified: Secondary | ICD-10-CM

## 2014-09-27 LAB — BASIC METABOLIC PANEL
Anion gap: 8 (ref 5–15)
BUN: 29 mg/dL — ABNORMAL HIGH (ref 6–20)
CHLORIDE: 106 mmol/L (ref 101–111)
CO2: 22 mmol/L (ref 22–32)
Calcium: 7.4 mg/dL — ABNORMAL LOW (ref 8.9–10.3)
Creatinine, Ser: 0.95 mg/dL (ref 0.44–1.00)
GFR calc non Af Amer: 56 mL/min — ABNORMAL LOW (ref >60)
Glucose, Bld: 78 mg/dL (ref 65–99)
Potassium: 5.8 mmol/L — ABNORMAL HIGH (ref 3.5–5.1)
Sodium: 136 mmol/L (ref 135–145)

## 2014-09-27 MED ORDER — DIPHENOXYLATE-ATROPINE 2.5-0.025 MG PO TABS
1.0000 | ORAL_TABLET | Freq: Once | ORAL | Status: DC
  Filled 2014-09-27: qty 1

## 2014-09-27 NOTE — Progress Notes (Signed)
[facility] Progress Note   [name] HFS:[id number] DOB: 1934-11-18 DOA: 09/25/2014 PCP: Hollace Kinnier, DO  Brief narrative: [name] is a 80 y.o. female with rheumatoid arthritis, hypertension comes from Dr. Aurea Graff office for severe cellulitis. She underwent a left total knee arthroplasty on 4/7 for advanced joint disease in relation to rheumatoid arthritis and a recent tibial plateau fracture. She had cellulitis of he left leg about 1 month ago and received a 10 day course of Bactrim which resolved it.  She developed redness and swelling again 2 wks ago and was seen in the wound care center and referred to Dr Alvan Dame. Dr Alvan Dame referred her for a direct admission as the cellulitis extends from her groin down the medial leg to the foot. She has not had any antibiotics since the course of bactrim the leg is swollen and weeping. No significant pain in the knee where she had the surgery.   Subjective: Starting having severe diarrhea yesterday- did not tell staff and it was not discovered until today.   Assessment/Plan: Principal Problem:   Cellulitis - cont Clindamycin- improving on exam  Active Problems:  Chronic wounds left leg/ swelling / drainage to clear fluid - likely the source of above cellulitis - wound care consulted  Diarrhea - in setting of antibiotics- check c diff   Ulcerative colitis, chronic - controlled until yesterday-- checking for c diff - cont Balsalazide   S/P left TKA - does not appear to have septic arthtitis   HTN (hypertension) - holding HCTZ  ARf - cr rose yesterday- start IVF- cont to hold HCTZ- hold celebrex - Cr improved today   Rheumatoid arthritis - Humira on hold due to recent cellulitis- will cont to hold - cont Arava and Prednisone to prevent a flare  Hypokalemia - replaced - slightly elevated today  Code Status: full Family Communication:  Disposition Plan:  DVT prophylaxis:  heparin Consultants: Procedures:  Antibiotics: Anti-infectives    Start     Dose/Rate Route Frequency Ordered Stop   09/25/14 1800  clindamycin (CLEOCIN) IVPB 900 mg     900 mg 100 mL/hr over 30 Minutes Intravenous 3 times per day 09/25/14 1634        Objective: Filed Weights   09/25/14 1500  Weight: 59.2 kg (130 lb 8.2 oz)    Intake/Output Summary (Last 24 hours) at 09/27/14 1203 Last data filed at 09/27/14 1100  Gross per 24 hour  Intake 3378.33 ml  Output   1200 ml  Net 2178.33 ml     Vitals Filed Vitals:   09/26/14 0500 09/26/14 1400 09/26/14 2146 09/27/14 0608  BP: 112/68 122/68 125/56 102/58  Pulse: 62 65 69 73  Temp: 98 F (36.7 C) 99.1 F (37.3 C) 99.8 F (37.7 C) 99.1 F (37.3 C)  TempSrc: Oral Oral Oral Oral  Resp: 17 18 18 18   Height:      Weight:      SpO2: 100% 97% 96% 94%    Exam:  General:  Pt is alert, not in acute distress  HEENT: No icterus, No thrush, oral mucosa moist  Cardiovascular: regular rate and rhythm, S1/S2 No murmur  Respiratory: clear to auscultation bilaterally   Abdomen: Soft, +Bowel sounds, non tender, non distended, no guarding  MSK: No LE edema, cyanosis or clubbing  Skin: improving erythema and swelling of left foot, erythema extends up left leg (full circumference) involving the knee and then medially up the thigh to the groin- swelling of entire leg -  knee not tender- chronic wounds on left leg present  Data Reviewed: Basic Metabolic Panel:  Recent Labs Lab 09/25/14 1630 09/26/14 0507 09/27/14 0842  NA 135 138 136  K 3.0* 4.4 5.8*  CL 95* 101 106  CO2 29 29 22   GLUCOSE 117* 90 78  BUN 35* 32* 29*  CREATININE 0.92 1.14* 0.95  CALCIUM 8.3* 7.5* 7.4*   Liver Function Tests: No results for input(s): AST, ALT, ALKPHOS, BILITOT, PROT, ALBUMIN in the last 168 hours. No results for input(s): LIPASE, AMYLASE in the last 168 hours. No results for input(s): AMMONIA in the last 168 hours. CBC:  Recent  Labs Lab 09/25/14 1630 09/26/14 0507  WBC 11.0* 9.0  HGB 11.3* 9.7*  HCT 34.7* 29.5*  MCV 87.6 88.3  PLT 334 321   Cardiac Enzymes: No results for input(s): CKTOTAL, CKMB, CKMBINDEX, TROPONINI in the last 168 hours. BNP (last 3 results) No results for input(s): BNP in the last 8760 hours.  ProBNP (last 3 results) No results for input(s): PROBNP in the last 8760 hours.  CBG: No results for input(s): GLUCAP in the last 168 hours.  Recent Results (from the past 240 hour(s))  Culture, blood (routine x 2)     Status: None (Preliminary result)   Collection Time: 09/25/14  4:30 PM  Result Value Ref Range Status   Specimen Description BLOOD RIGHT ARM  Final   Special Requests BOTTLES DRAWN AEROBIC AND ANAEROBIC [place]  Final   Culture   Final    NO GROWTH < 24 HOURS Performed at [facility]    Report Status PENDING  Incomplete  Culture, blood (routine x 2)     Status: None (Preliminary result)   Collection Time: 09/25/14  4:40 PM  Result Value Ref Range Status   Specimen Description BLOOD LARM  Final   Special Requests BOTTLES DRAWN AEROBIC AND ANAEROBIC 10CC  Final   Culture   Final    NO GROWTH < 24 HOURS Performed at [facility]    Report Status PENDING  Incomplete     Studies: No results found.  Scheduled Meds:  Scheduled Meds: . balsalazide  2,250 mg Oral BID  . clindamycin (CLEOCIN) IV  900 mg Intravenous 3 times per day  . collagenase   Topical Daily  . estradiol  0.5 mg Oral Daily  . folic acid  2 mg Oral Daily  . heparin  5,000 Units Subcutaneous 3 times per day  . latanoprost  1 drop Both Eyes QHS  . leflunomide  20 mg Oral Daily  . medroxyPROGESTERone  2.5 mg Oral QHS  . multivitamin with minerals  1 tablet Oral Daily  . predniSONE  5 mg Oral Q breakfast  . saccharomyces boulardii  250 mg Oral BID  . levothyroxine  88 mcg Oral QAC breakfast   Continuous Infusions: . sodium chloride 10 mL/hr at 09/25/14 2139  . sodium chloride 100 mL/hr  (09/27/14 0724)    Time spent on care of this patient: 31 min   [place], MD 09/27/2014, 12:03 PM  LOS: 2 days   [facility] Office  [phone] Pager - Text Page per [email or web address] If 7PM-7AM, please contact night-coverage [email or web address]

## 2014-09-27 NOTE — Progress Notes (Signed)
Subjective:     Patient reports pain as 4 on 0-10 scale.    Objective: Vital signs in last 24 hours: Temp:  [99.1 F (37.3 C)-99.8 F (37.7 C)] 99.1 F (37.3 C) (07/17 0608) Pulse Rate:  [65-73] 73 (07/17 0608) Resp:  [18] 18 (07/17 0608) BP: (102-125)/(56-68) 102/58 mmHg (07/17 0608) SpO2:  [94 %-97 %] 94 % (07/17 0608)  Intake/Output from previous day: 07/16 0701 - 07/17 0700 In: 3648.3 [P.O.:2160; I.V.:1338.3; IV Piggyback:150] Out: 1200 [Urine:1200] Intake/Output this shift:     Recent Labs  09/25/14 1630 09/26/14 0507  HGB 11.3* 9.7*    Recent Labs  09/25/14 1630 09/26/14 0507  WBC 11.0* 9.0  RBC 3.96 3.34*  HCT 34.7* 29.5*  PLT 334 321    Recent Labs  09/25/14 1630 09/26/14 0507  NA 135 138  K 3.0* 4.4  CL 95* 101  CO2 29 29  BUN 35* 32*  CREATININE 0.92 1.14*  GLUCOSE 117* 90  CALCIUM 8.3* 7.5*   No results for input(s): LABPT, INR in the last 72 hours.  Neurologically intact Intact pulses distally Dorsiflexion/Plantar flexion intact Incision: dressing C/D/I Compartment soft Erythema less. Assessment/Plan:     Continue ABX therapy due to Post-op infection  Cellulitis improving  Marquize Seib C 09/27/2014, 8:19 AM

## 2014-09-28 DIAGNOSIS — K51918 Ulcerative colitis, unspecified with other complication: Secondary | ICD-10-CM

## 2014-09-28 LAB — BASIC METABOLIC PANEL
ANION GAP: 5 (ref 5–15)
BUN: 23 mg/dL — AB (ref 6–20)
CHLORIDE: 109 mmol/L (ref 101–111)
CO2: 24 mmol/L (ref 22–32)
CREATININE: 0.92 mg/dL (ref 0.44–1.00)
Calcium: 7.4 mg/dL — ABNORMAL LOW (ref 8.9–10.3)
GFR calc Af Amer: >60 mL/min (ref >60)
GFR calc non Af Amer: 59 mL/min — ABNORMAL LOW (ref >60)
Glucose, Bld: 87 mg/dL (ref 65–99)
Potassium: 4.3 mmol/L (ref 3.5–5.1)
SODIUM: 138 mmol/L (ref 135–145)

## 2014-09-28 MED ORDER — HYDROCHLOROTHIAZIDE 25 MG PO TABS: 25.0000 mg | ORAL_TABLET | Freq: Every day | ORAL | Status: DC

## 2014-09-28 MED ORDER — COLLAGENASE 250 UNIT/GM EX OINT: TOPICAL_OINTMENT | Freq: Every day | CUTANEOUS | Status: DC

## 2014-09-28 MED ORDER — CLINDAMYCIN HCL 300 MG PO CAPS: 600.0000 mg | ORAL_CAPSULE | Freq: Three times a day (TID) | ORAL | Status: DC

## 2014-09-28 MED ORDER — HYDROCHLOROTHIAZIDE 25 MG PO TABS: 25.0000 mg | ORAL_TABLET | Freq: Every day | ORAL | Status: DC | PRN

## 2014-09-28 MED ORDER — LOPERAMIDE HCL 2 MG PO CAPS: 2.0000 mg | ORAL_CAPSULE | Freq: Four times a day (QID) | ORAL | Status: DC

## 2014-09-28 MED ORDER — SACCHAROMYCES BOULARDII 250 MG PO CAPS: 250.0000 mg | ORAL_CAPSULE | Freq: Two times a day (BID) | ORAL | Status: DC

## 2014-09-28 NOTE — Progress Notes (Signed)
Utilization review completed.  

## 2014-09-28 NOTE — Discharge Summary (Addendum)
Physician Discharge Summary  [name]:[id number] DOB: February 15, 1935 DOA: 09/25/2014  PCP: Hollace Kinnier, DO  Admit date: 09/25/2014 Discharge date: 09/28/2014  Time spent: 50 minutes  Recommendations for Outpatient Follow-up:  1. May be able to resume Humira next week if cellulitis resolves for diarrhea which is uncontrolled  2. Cont High dose Clindamycin for 7 more days 3. Has wound care appt in 2 days  4. Making f/u appt with Dr Mariea Clonts foe next week  Discharge Condition: stable Diet recommendation: heart healthy  Discharge Diagnoses:  Principal Problem:   Extensive Cellulitis Active Problems:   HTN (hypertension)   Rheumatoid arthritis   Ulcerative colitis, chronic   S/P left TKA   Hypokalemia   History of present illness:  [name] is a 80 y.o. female with rheumatoid arthritis, hypertension comes from Dr. Aurea Graff office for severe cellulitis. She underwent a left total knee arthroplasty on 4/7 for advanced joint disease in relation to rheumatoid arthritis and a recent tibial plateau fracture. She had cellulitis of he left leg about 1 month ago and received a 10 day course of Bactrim which resolved it.  She developed redness and swelling again 2 wks ago and was seen in the wound care center and referred to Dr Alvan Dame. Dr Alvan Dame referred her for a direct admission as the cellulitis extends from her groin down the medial leg to the foot. She has not had any antibiotics since the course of bactrim-  the leg is swollen and weeping as well. No significant pain in the knee where she had the surgery.  Hospital Course:  Principal Problem:  Extensive Cellulitis - of left knee extending up to thigh and down to foot- she's had a recent left total knee replacement the concern was that this may be a wound infection and may extend into the knee joint - has improved considerable with clindamycin 900 mg IV TID for 3 days- will transition to 600 mg TID (oral) for 7 more days- this can be  adjusted by her PCP or wound care doctor as needed - blood cultures negative  Active Problems: Chronic wounds left leg/ swelling / drainage of clear fluid - has an appt with the wound care center this Wed- she saw them last week - limit use of Lasix and HCTZ which is being used for pedal edema - due to diarrhea, high risk for dehydration - cont ACE wraps to leg and keep leg elevated as much as possible- left leg edema has not increased despite the IV fluids given to her   S/P left TKA - does not appear to have septic arthtitis  ARF - given IVF while hospitalized to correct this- limit use of Lasix and HCTZ- both are PRN- discussed with patient   Rheumatoid arthritis - Humira on hold due to recent cellulitis- will cont to hold- hopefully can resume next week of cellulitis improves - cont Arava and Prednisone to prevent a flare   Ulcerative colitis, chronic - lately diarrhea has been profuse-non-bloody- she is not having abdominal pain or vomiting- no abdominal tenderness on exam -  I have recommended she get the OK from her doctor to resume Humira next week if her cellulitis resolves- the patient has been holding it for 1 month now since her last episode of cellulitis - cont Balsalazide- hold off on Lomotil/ Imodium- high risk for getting c diff colitis while she is on Clindamycin and these agents would cause her infection to get worse if she was to  develop it  Hypokalemia - replaced   Discharge Exam: Filed Weights   09/25/14 1500  Weight: 59.2 kg (130 lb 8.2 oz)   Filed Vitals:   09/28/14 0618  BP: 114/51  Pulse: 68  Temp: 98.4 F (36.9 C)  Resp: 16    General: AAO x 3, no distress Cardiovascular: RRR, no murmurs  Respiratory: clear to auscultation bilaterally GI: soft, non-tender, non-distended, bowel sound positive  Discharge Instructions You were cared for by a hospitalist during your hospital stay. If you have any questions about your discharge medications or  the care you received while you were in the hospital after you are discharged, you can call the unit and asked to speak with the hospitalist on call if the hospitalist that took care of you is not available. Once you are discharged, your primary care physician will handle any further medical issues. Please note that NO REFILLS for any discharge medications will be authorized once you are discharged, as it is imperative that you return to your primary care physician (or establish a relationship with a primary care physician if you do not have one) for your aftercare needs so that they can reassess your need for medications and monitor your lab values.  Discharge Instructions    Diet - low sodium heart healthy    Complete by:  As directed      Discharge instructions    Complete by:  As directed   Apply ACE wrap to left leg daily every morning to keep swelling down- keep leg elevated AT [place] when every you are sitting.     Increase activity slowly    Complete by:  As directed             Medication List    STOP taking these medications        adalimumab 40 MG/0.8ML injection- has not taken this in 1 month  Commonly known as:  HUMIRA      TAKE these medications        aspirin EC 81 MG tablet  Take 81 mg by mouth every morning.     balsalazide 750 MG capsule  Commonly known as:  COLAZAL  Take 2,250 mg by mouth 2 (two) times daily.     Calcium + D3 600-200 MG-UNIT Tabs  Take 1 tablet by mouth daily.     CELEBREX 200 MG capsule  Generic drug:  celecoxib  Take 200 mg by mouth 2 (two) times daily.     clindamycin 300 MG capsule  Commonly known as:  CLEOCIN  Take 2 capsules (600 mg total) by mouth 3 (three) times daily.     collagenase ointment  Commonly known as:  SANTYL  Apply topically daily.     diazepam 5 MG tablet  Commonly known as:  VALIUM  Take 1 tablet (5 mg total) by mouth every 6 (six) hours as needed for muscle spasms.     estradiol 0.5 MG tablet   Commonly known as:  ESTRACE  Take 0.5 mg by mouth at bedtime.     Fish Oil 1200 MG Caps  Take 1,200 mg by mouth daily.     folic acid 1 MG tablet  Commonly known as:  FOLVITE  Take 2 mg by mouth daily.     furosemide 20 MG tablet  Commonly known as:  LASIX  Take 20 mg by mouth daily as needed for fluid or edema.     guaiFENesin 600 MG 12 hr tablet  Commonly known as:  MUCINEX  Take 1,200 mg by mouth daily as needed (sinus relief).     hydrochlorothiazide 25 MG tablet  Commonly known as:  HYDRODIURIL  Take 1 tablet (25 mg total) by mouth daily PRN for edema     HYDROcodone-acetaminophen 5-325 MG per tablet  Commonly known as:  NORCO/VICODIN  Take 1 tablet by mouth every 6 (six) hours as needed for moderate pain (pain).     latanoprost 0.005 % ophthalmic solution  Commonly known as:  XALATAN  Place 1 drop into both eyes at bedtime.     leflunomide 20 MG tablet  Commonly known as:  ARAVA  Take 20 mg by mouth every morning.     medroxyPROGESTERone 2.5 MG tablet  Commonly known as:  PROVERA  Take 2.5 mg by mouth at bedtime.     multivitamin with minerals Tabs tablet  Take 1 tablet by mouth daily.     oxyCODONE-acetaminophen 5-325 MG per tablet  Commonly known as:  PERCOCET/ROXICET  Take 1-2 tablets by mouth every 8 (eight) hours as needed for severe pain.     Potassium Gluconate 595 MG Caps  Take 1 capsule by mouth daily as needed (with furosemide.).     predniSONE 5 MG tablet  Commonly known as:  DELTASONE  Take 5 mg by mouth daily with breakfast.     saccharomyces boulardii 250 MG capsule  Commonly known as:  FLORASTOR  Take 1 capsule (250 mg total) by mouth 2 (two) times daily.     SYNTHROID 88 MCG tablet  Generic drug:  levothyroxine  Take 88 mcg by mouth daily before breakfast. DAW, DO NOT SUBSTITUTE WITH GENERIC     Vitamin D3 1000 UNITS Caps  Take 1,000 Units by mouth daily.      ASK your doctor about these medications         sulfamethoxazole-trimethoprim 800-160 MG per tablet  Commonly known as:  BACTRIM DS,SEPTRA DS  Take 1 tablet by mouth 2 (two) times daily.       No Known Allergies    The results of significant diagnostics from this hospitalization (including imaging, microbiology, ancillary and laboratory) are listed below for reference.    Significant Diagnostic Studies: No results found.  Microbiology: Recent Results (from the past 240 hour(s))  Culture, blood (routine x 2)     Status: None (Preliminary result)   Collection Time: 09/25/14  4:30 PM  Result Value Ref Range Status   Specimen Description BLOOD RIGHT ARM  Final   Special Requests BOTTLES DRAWN AEROBIC AND ANAEROBIC [place]  Final   Culture   Final    NO GROWTH 2 DAYS Performed at [facility]    Report Status PENDING  Incomplete  Culture, blood (routine x 2)     Status: None (Preliminary result)   Collection Time: 09/25/14  4:40 PM  Result Value Ref Range Status   Specimen Description BLOOD LEFT ARM  Final   Special Requests BOTTLES DRAWN AEROBIC AND ANAEROBIC 10CC  Final   Culture   Final    NO GROWTH 2 DAYS Performed at [facility]    Report Status PENDING  Incomplete     Labs: Basic Metabolic Panel:  Recent Labs Lab 09/25/14 1630 09/26/14 0507 09/27/14 0842 09/28/14 0745  NA 135 138 136 138  K 3.0* 4.4 5.8* 4.3  CL 95* 101 106 109  CO2 29 29 22 24   GLUCOSE 117* 90 78 87  BUN 35* 32* 29* 23*  CREATININE 0.92 1.14* 0.95 0.92  CALCIUM 8.3* 7.5* 7.4* 7.4*   Liver Function Tests: No results for input(s): AST, ALT, ALKPHOS, BILITOT, PROT, ALBUMIN in the last 168 hours. No results for input(s): LIPASE, AMYLASE in the last 168 hours. No results for input(s): AMMONIA in the last 168 hours. CBC:  Recent Labs Lab 09/25/14 1630 09/26/14 0507  WBC 11.0* 9.0  HGB 11.3* 9.7*  HCT 34.7* 29.5*  MCV 87.6 88.3  PLT 334 321   Cardiac Enzymes: No results for input(s): CKTOTAL, CKMB, CKMBINDEX,  TROPONINI in the last 168 hours. BNP: BNP (last 3 results) No results for input(s): BNP in the last 8760 hours.  ProBNP (last 3 results) No results for input(s): PROBNP in the last 8760 hours.  CBG: No results for input(s): GLUCAP in the last 168 hours.     SignedDebbe Odea, MD [facility] 09/28/2014, 10:32 AM

## 2014-09-28 NOTE — Progress Notes (Signed)
Patient DC home with spouse. She was provided with follow up appt, education, and instructions. She was taught wound care changes and says she has an understanding. Transferred out via [place] accompanied by staff

## 2014-09-28 NOTE — Progress Notes (Signed)
Physical Therapy Treatment Patient Details Name: [name] MRN: [id number] DOB: Sep 10, 1934 Today's Date: 2014/10/26    History of Present Illness 79 yo female adm with LLE cellulitis; PMHx:  L TKA 06/16/14, back surgeries, RA    PT Comments    Pt to discharge home today however agreeable to ambulate stating she hasn't been up much here.  Balance appears improved today compared to previous PT note.  Encouraged pt to continue performing L knee exercises as tolerated.  Pt demonstrated approx 100 degrees active L knee flexion sitting edge of chair, likely limited due to swelling (pt reports she had more ROM prior to cellulitis).  Pt had no further questions.   Follow Up Recommendations  No PT follow up     Equipment Recommendations  None recommended by PT    Recommendations for Other Services       Precautions / Restrictions Precautions Precautions: Knee;Fall Restrictions Weight Bearing Restrictions: No    Mobility  Bed Mobility Overal bed mobility: Modified Independent                Transfers Overall transfer level: Needs assistance Equipment used: Straight cane Transfers: Sit to/from Stand Sit to Stand: Supervision         General transfer comment: increased time to stand, effortful  Ambulation/Gait Ambulation/Gait assistance: Supervision Ambulation Distance (Feet): 160 Feet Assistive device: Straight cane Gait Pattern/deviations: Step-through pattern;Decreased stance time - left;Antalgic     General Gait Details: no unsteadiness or LOB today, pt reports feeling weak   Stairs            Wheelchair Mobility    Modified Rankin (Stroke Patients Only)       Balance                                    Cognition Arousal/Alertness: Awake/alert Behavior During Therapy: WFL for tasks assessed/performed Overall Cognitive Status: Within Functional Limits for tasks assessed                      Exercises General  Exercises - Lower Extremity Long Arc Quad: AROM;Both;5 reps;Seated Straight Leg Raises: AROM;Both;5 reps;Seated    General Comments        Pertinent Vitals/Pain Pain Assessment: 0-10 Pain Score: 2  Pain Location: L LE Pain Descriptors / Indicators: Discomfort Pain Intervention(s): Limited activity within patient's tolerance;Monitored during session    Home Living                      Prior Function            PT Goals (current goals can now be found in the care plan section) Progress towards PT goals: Progressing toward goals    Frequency  Min 3X/week    PT Plan Current plan remains appropriate    Co-evaluation             End of Session   Activity Tolerance: Patient tolerated treatment well Patient left: in bed;with nursing/sitter in room;with call bell/phone within reach     Time: 1042-1051 PT Time Calculation (min) (ACUTE ONLY): 9 min  Charges:  $Gait Training: 8-22 mins                    G Codes:      Kymiah Araiza,KATHrine E 10/26/2014, 10:57 AM Carmelia Bake, PT, DPT October 26, 2014 Pager: [phone]

## 2014-09-28 NOTE — Care Management Important Message (Signed)
Important Message  Patient Details  Name: [name] MRN: [id number] Date of Birth: 1934-04-20   Medicare Important Message Given:  Yes-second notification given    Camillo Flaming 09/28/2014, 12:21 [place] Message  Patient Details  Name: [name] MRN: [id number] Date of Birth: [birth year]   Medicare Important Message Given:  Yes-second notification given    Camillo Flaming 09/28/2014, 12:20 PM

## 2014-09-30 DIAGNOSIS — S81811A Laceration without foreign body, right lower leg, initial encounter: Secondary | ICD-10-CM | POA: Diagnosis not present

## 2014-09-30 DIAGNOSIS — X58XXXA Exposure to other specified factors, initial encounter: Secondary | ICD-10-CM | POA: Diagnosis not present

## 2014-09-30 DIAGNOSIS — L97222 Non-pressure chronic ulcer of left calf with fat layer exposed: Secondary | ICD-10-CM | POA: Diagnosis present

## 2014-09-30 DIAGNOSIS — F17218 Nicotine dependence, cigarettes, with other nicotine-induced disorders: Secondary | ICD-10-CM | POA: Diagnosis not present

## 2014-09-30 DIAGNOSIS — H409 Unspecified glaucoma: Secondary | ICD-10-CM | POA: Diagnosis not present

## 2014-09-30 DIAGNOSIS — I890 Lymphedema, not elsewhere classified: Secondary | ICD-10-CM | POA: Diagnosis not present

## 2014-09-30 DIAGNOSIS — F418 Other specified anxiety disorders: Secondary | ICD-10-CM | POA: Diagnosis not present

## 2014-09-30 DIAGNOSIS — L97221 Non-pressure chronic ulcer of left calf limited to breakdown of skin: Secondary | ICD-10-CM | POA: Diagnosis not present

## 2014-09-30 DIAGNOSIS — I10 Essential (primary) hypertension: Secondary | ICD-10-CM | POA: Diagnosis not present

## 2014-09-30 DIAGNOSIS — L97211 Non-pressure chronic ulcer of right calf limited to breakdown of skin: Secondary | ICD-10-CM | POA: Diagnosis not present

## 2014-09-30 DIAGNOSIS — M069 Rheumatoid arthritis, unspecified: Secondary | ICD-10-CM | POA: Diagnosis not present

## 2014-09-30 LAB — CULTURE, BLOOD (ROUTINE X 2)
CULTURE: NO GROWTH
CULTURE: NO GROWTH

## 2014-10-01 NOTE — Progress Notes (Signed)
[id number]/[facility] Dr. Delfino Lovett Arroson/patient meets inpatient per glines and med. Necessity.   Rep mad [place] of decision/Rhonda Davis,RN,BSN,CCM

## 2014-10-05 ENCOUNTER — Telehealth: Payer: Self-pay | Admitting: *Deleted

## 2014-10-05 ENCOUNTER — Encounter: Payer: Self-pay | Admitting: Internal Medicine

## 2014-10-05 ENCOUNTER — Ambulatory Visit (INDEPENDENT_AMBULATORY_CARE_PROVIDER_SITE_OTHER): Payer: Medicare Other | Admitting: Internal Medicine

## 2014-10-05 VITALS — BP 138/68 | HR 74 | Temp 98.3°F | Resp 20 | Ht 68.0 in | Wt 139.6 lb

## 2014-10-05 DIAGNOSIS — Z9889 Other specified postprocedural states: Secondary | ICD-10-CM

## 2014-10-05 DIAGNOSIS — R609 Edema, unspecified: Secondary | ICD-10-CM | POA: Diagnosis not present

## 2014-10-05 DIAGNOSIS — R5383 Other fatigue: Secondary | ICD-10-CM | POA: Diagnosis not present

## 2014-10-05 DIAGNOSIS — S27818A Other injury of esophagus (thoracic part), initial encounter: Secondary | ICD-10-CM

## 2014-10-05 DIAGNOSIS — K5190 Ulcerative colitis, unspecified, without complications: Secondary | ICD-10-CM

## 2014-10-05 DIAGNOSIS — M069 Rheumatoid arthritis, unspecified: Secondary | ICD-10-CM | POA: Diagnosis not present

## 2014-10-05 DIAGNOSIS — L03116 Cellulitis of left lower limb: Secondary | ICD-10-CM | POA: Diagnosis not present

## 2014-10-05 NOTE — Telephone Encounter (Signed)
She can try taking 1/2 tablet (10mg ) instead of 20mg  daily.  She should resume her potassium supplement.  (I didn't realize she had also stopped it.)

## 2014-10-05 NOTE — Progress Notes (Signed)
Patient ID: [name], female   DOB: Sep 18, 1934, 80 y.o.   MRN: [id number]   Location:  [facility]  Code Status: full code Goals of Care: Advanced Directive information Does patient have an advance directive?: No, Would patient like information on creating an advanced directive?: Yes - Educational materials given (at a previous appt)   Chief Complaint  Patient presents with  . Hospitalization Follow-up    hospital follow-up    HPI: Patient is a 80 y.o. white female seen in the office today for hospital follow up.    Hospital notes, labs and studies reviewed:  She was treated for cellulitis of her right leg with bactrim x 10 days in June.  She has been following with the wound care center where I referred her due to several wounds of her lower legs.  From there she was sent back to Dr. Alvan Dame due to redness of the left leg where she had recent left knee surgery.  When he saw her on 7/15, he referred her for direct admission at [facility].  She was there for three days and was treated for left LE cellulitis with clindamycin tid.  She completed another week's course after discharge 7/18 which she now finished a week ago.  Left leg cellulitis is better, she notes.  She has bilateral wound care center dressings in place.    Wants to know why her legs are swollen when venous study was essentially normal.  Still seems to have chronic venous insufficiency.    Stopped her lasix and her hctz 4 days ago b/c she was tired of urinating all of the time.    Says she has lost all of her ROM of her left knee and now has pain where she never did after the knee surgery.  She has an appt upcoming this week with Dr. Alvan Dame.  Her blood cultures were normal in the hospital.    Has had ongoing ulcerative colitis difficulty b/c she was off humira due to antibiotics. Uncontrollable diarrhea has stopped now.  Has been off the antibiotics for a week now and asks to restart  humira. Discussed s/s of C diff and to let us know immediately if she develops them.  Has had irritated esophagus from clindamycin pill not going down well.  Having to cut pills in half to take them. Asked me to check it.  Fatigue is enormous and worse since hospitalization.    Still taking a puff every few days, but claims she does not inhale.  Says wound doctor was upset about it.  I've been advising her to quit for several years.  Review of Systems:  Review of Systems  Constitutional: Positive for malaise/fatigue. Negative for fever and chills.  HENT: Positive for hearing loss.   Respiratory: Negative for shortness of breath.   Cardiovascular: Positive for leg swelling. Negative for chest pain.       Has gained weight stopping hctz and lasix  Gastrointestinal: Negative for abdominal pain, diarrhea, constipation, blood in stool and melena.       Loose stools better  Genitourinary: Negative for dysuria, urgency and frequency.       Frequency better w/o diuretics as is energy  Musculoskeletal: Positive for joint pain. Negative for falls.       Now left knee is bothering her when it didn't postop  Skin: Negative for rash.       Dry scaly skin of left inner thigh  Neurological:  Positive for weakness. Negative for dizziness and loss of consciousness.  Psychiatric/Behavioral: Positive for depression. Negative for memory loss.    Past Medical History  Diagnosis Date  . Ulcerative colitis   . Hypothyroidism   . Hypertension     pcp   dr Dann Ventress   [facility]  . Pneumonia 2008  . Chronic bronchitis 10/31/2011    "prone to it; I have sinus/bronchitis problem probably q yr"  . Multiple skin tears 10/31/2011    "get them very easily; my skin is thin and I bruise easily"  . Chronic back pain     "my entire spine"  . Edema   . Other and unspecified hyperlipidemia   . Encounter for long-term (current) use of other medications   . Senile osteoporosis   . Spinal stenosis, unspecified  region other than cervical   . Unspecified glaucoma   . Hypothyroidism   . Complication of anesthesia 2009    htn with block- for hand surgery   . Anxiety     sometimes   . Rheumatoid arthritis(714.0)     degenerative disc disease     Past Surgical History  Procedure Laterality Date  . Back surgery    . Sinus surgery with instatrak    . Thyroidectomy, partial  1997  . Posterior fusion lumbar spine  10/31/2011    L2-3; 3-4  . Tonsillectomy      "when I was a small child"  . Functional endoscopic sinus surgery  1988  . Posterior fusion lumbar spine  11/2009    L3-4;  L4-5  . Joint replacement  2009; 2006    joints 2 fingers,rt hand; joint left thumb  . Spine surgery    . Posterior lumbar fusion N/A 08/05/13    T1, T2  . Eye surgery    . Anterior cervical decomp/discectomy fusion N/A 02/17/2014    Procedure: CERVICAL FOUR TO FIVE ANTERIOR CERVICAL DECOMPRESSION/DISCECTOMY FUSION 1 LEVEL;  Surgeon: [name], MD;  Location: [facility];  Service: Neurosurgery;  Laterality: N/A;  C4-5 Anterior cervical decompression/diskectomy/fusion  . Cataract extraction Left 11/11/2013  . Cataract extraction Right 12/30/2013  . Total knee arthroplasty Left 06/16/2014    Procedure: TOTAL KNEE ARTHROPLASTY;  Surgeon: Paralee Cancel, MD;  Location: [facility];  Service: Orthopedics;  Laterality: Left;    No Known Allergies Medications: Patient's Medications  New Prescriptions   No medications on file  Previous Medications   ASPIRIN EC 81 MG TABLET    Take 81 mg by mouth every morning.    BALSALAZIDE (COLAZAL) 750 MG CAPSULE    Take 2,250 mg by mouth 2 (two) times daily.    CALCIUM CARB-CHOLECALCIFEROL (CALCIUM + D3) 600-200 MG-UNIT TABS    Take 1 tablet by mouth daily.   CELEBREX 200 MG CAPSULE    Take 200 mg by mouth 2 (two) times daily.    CHOLECALCIFEROL (VITAMIN D3) 1000 UNITS CAPS    Take 1,000 Units by mouth daily.   COLLAGENASE (SANTYL) OINTMENT    Apply topically daily.   DIAZEPAM (VALIUM)  5 MG TABLET    Take 1 tablet (5 mg total) by mouth every 6 (six) hours as needed for muscle spasms.   ESTRADIOL (ESTRACE) 0.5 MG TABLET    Take 0.5 mg by mouth at bedtime.    FOLIC ACID (FOLVITE) 1 MG TABLET    Take 2 mg by mouth daily.   FUROSEMIDE (LASIX) 20 MG TABLET    Take 20 mg by mouth  daily as needed for fluid or edema.    GUAIFENESIN (MUCINEX) 600 MG 12 HR TABLET    Take 1,200 mg by mouth daily as needed (sinus relief).    HYDROCODONE-ACETAMINOPHEN (NORCO/VICODIN) 5-325 MG PER TABLET    Take 1 tablet by mouth every 6 (six) hours as needed for moderate pain (pain).   LATANOPROST (XALATAN) 0.005 % OPHTHALMIC SOLUTION    Place 1 drop into both eyes at bedtime.   LEFLUNOMIDE (ARAVA) 20 MG TABLET    Take 20 mg by mouth every morning.    LEVOTHYROXINE (SYNTHROID) 88 MCG TABLET    Take 88 mcg by mouth daily before breakfast. DAW, DO NOT SUBSTITUTE WITH GENERIC   MEDROXYPROGESTERONE (PROVERA) 2.5 MG TABLET    Take 2.5 mg by mouth at bedtime.    MULTIPLE VITAMIN (MULTIVITAMIN WITH MINERALS) TABS    Take 1 tablet by mouth daily.   OMEGA-3 FATTY ACIDS (FISH OIL) 1200 MG CAPS    Take 1,200 mg by mouth daily.   OXYCODONE-ACETAMINOPHEN (PERCOCET/ROXICET) 5-325 MG PER TABLET    Take 1-2 tablets by mouth every 8 (eight) hours as needed for severe pain.   POTASSIUM GLUCONATE 595 MG CAPS    Take 1 capsule by mouth daily as needed (with furosemide.).   PREDNISONE (DELTASONE) 5 MG TABLET    Take 5 mg by mouth daily with breakfast.   Modified Medications   No medications on file  Discontinued Medications   CLINDAMYCIN (CLEOCIN) 300 MG CAPSULE    Take 2 capsules (600 mg total) by mouth 3 (three) times daily.   HYDROCHLOROTHIAZIDE (HYDRODIURIL) 25 MG TABLET    Take 1 tablet (25 mg total) by mouth daily as needed.   SACCHAROMYCES BOULARDII (FLORASTOR) 250 MG CAPSULE    Take 1 capsule (250 mg total) by mouth 2 (two) times daily.    Physical Exam: Filed Vitals:   10/05/14 1052  BP: 138/68  Pulse: 74    Temp: 98.3 F (36.8 C)  TempSrc: Oral  Resp: 20  Height: 5\' 8"  (1.727 m)  Weight: 139 lb 9.6 oz (63.322 kg)  SpO2: 96%   Physical Exam  Constitutional: She is oriented to person, place, and time. No distress.  HENT:  Mouth/Throat: Oropharynx is clear and moist.  Mild erythema present  Neck: Neck supple.  Cardiovascular: Normal rate and regular rhythm.   Murmur heard. Edema left greater than right  Pulmonary/Chest: Effort normal and breath sounds normal. No respiratory distress.  Slight rhonchorous sound  Abdominal: Soft. Bowel sounds are normal.  Musculoskeletal: She exhibits edema.  Walks with cane; now limping more on left knee  Neurological: She is alert and oriented to person, place, and time.  Skin: Skin is warm and dry. No erythema.  Scaly skin left shin; bilateral legs with several different wraps, unable to see any of her wounds, also wearing blue velcro orthopedic sandals  Psychiatric: She has a normal mood and affect. Her behavior is normal. Judgment and thought content normal.    Labs reviewed: Basic Metabolic Panel:  Recent Labs  03/23/14 0854  08/24/14 1119  09/26/14 0507 09/27/14 0842 09/28/14 0745  NA 141  < > 143  < > 138 136 138  K 3.7  < > 4.0  < > 4.4 5.8* 4.3  CL 100  < > 101  < > 101 106 109  CO2 26  < > 25  < > 29 22 24   GLUCOSE 74  < > 102*  < > 90 78 87  BUN 22  < > 26  < > 32* 29* 23*  CREATININE 0.88  < > 0.87  < > 1.14* 0.95 0.92  CALCIUM 9.4  < > 9.3  < > 7.5* 7.4* 7.4*  TSH 2.180  --  0.457  --   --   --   --   < > = values in this interval not displayed. Liver Function Tests:  Recent Labs  03/23/14 0854 08/24/14 1119  AST 26 28  ALT 9 12  ALKPHOS 108 156*  BILITOT 0.5 0.3  PROT 5.7* 6.1   No results for input(s): LIPASE, AMYLASE in the last 8760 hours. No results for input(s): AMMONIA in the last 8760 hours. CBC:  Recent Labs  03/23/14 0854  06/17/14 0430 08/24/14 1119 09/25/14 1630 09/26/14 0507  WBC 6.0  < > 8.8  7.5 11.0* 9.0  NEUTROABS 2.7  --   --  6.1  --   --   HGB 13.4  < > 11.6*  --  11.3* 9.7*  HCT 39.8  < > 36.1 37.8 34.7* 29.5*  MCV 90  < > 94.3  --  87.6 88.3  PLT 225  < > 190  --  334 321  < > = values in this interval not displayed. Lipid Panel:  Recent Labs  03/23/14 0854  CHOL 239*  HDL 86  LDLCALC 130*  TRIG 115  CHOLHDL 2.8   Assessment/Plan 1. Cellulitis of leg, left -completed clindamycin a week ago, no evidence of cellulitis of her upper leg (lower covered with dressing from [facility]) -counseled on c diff symptoms due to clindamycin therapy--she is VERY high risk for this  2. H/O left knee surgery -has f/u with Dr. Winifred Olive she needs to resume PT for this  -has no erythema of joint at present, does have swelling and increased pain where she did not postop  3. Other fatigue -multifactorial -she has complained of fatigue as long as I've been seeing her, but it is worse recently -she was to be on lasix only short term, but has wound up on it along with hctz longstanding -agreed to stop hctz and continue lasix at this point b/c hctz was not helping her edema anyway -also has RA and UC which cause fatigue  4. Ulcerative colitis, without complications -resume humira as she's one week out from completing her clindamycin and her uncontrollable loose stools have resolved from the abx  5. Rheumatoid arthritis -f/u with Dr. Amil Amen -is on prednisone 5mg  longstanding--discussed role of this in her edema, as well  6. Esophageal abrasion, initial encounter -advised to try cough drops, chloraseptic spray and mouthwashes with saline for irritation of her esophagus/throat -I did not see anything but a little erythema in her throat  7. Edema -bilateral, but left greater than right -multifactorial--discussed with her some venous insufficiency, prednisone, malnutrition (esp with recent diarrhea from UC and abx)--give some samples of boost and ensure alive to try (chocolate) -keep  wound care f/u for compression -d/c hctz -restart lasix 20mg  daily   Labs/tests ordered:  No new Next appt:  Keep 9/29 appt  Ikeisha Blumberg L. Mareesa Gathright, D.O. [facility] [address], [place] [address] Cell Phone (Mon-Fri 8am-5pm):  [phone] On Call:  [phone] & follow prompts after 5pm & weekends Office Phone:  [phone] Office Fax:  [phone]

## 2014-10-05 NOTE — Patient Instructions (Signed)
Try boost or ensure.   Stop hctz.  Resume lasix.

## 2014-10-05 NOTE — Telephone Encounter (Signed)
Patient called and stated that she was seen today and you told her to start back on her Lasix. Patient wants to know is the Lasix 20mg  can be reduced because she "spends her life in the bathroom" and she also wants to know if she should resume her potassium supplement. Please Advise.

## 2014-10-06 NOTE — Telephone Encounter (Signed)
Patient Notified and agreed,  understood directions.

## 2014-10-07 DIAGNOSIS — L97211 Non-pressure chronic ulcer of right calf limited to breakdown of skin: Secondary | ICD-10-CM | POA: Diagnosis not present

## 2014-10-11 ENCOUNTER — Other Ambulatory Visit: Payer: Self-pay | Admitting: Internal Medicine

## 2014-10-12 ENCOUNTER — Encounter (HOSPITAL_COMMUNITY): Payer: Self-pay | Admitting: *Deleted

## 2014-10-12 NOTE — H&P (Signed)
[name] is an 80 y.o. female.    Chief Complaint:     Infected left total knee arthroplasty   Procedure:     I&D and poly exchange of the left total knee arthroplasty  HPI: Pt is a 80 y.o. female complaining of left knee pain and swelling.  Patient had a left total knee on 06/16/2014 per Dr. Alvan Dame.  She has had a complicated course with severe RA and UC and is immunocompromised because of the medications needed.  She has cellulitis from a leg wound which is most likely the cause of the infected left total knee arthroplasty.  Do to swelling in the office the knee was aspirated and the labs revealed 24,000 WBC.  Various options are discussed with the patient.  Dr. Alvan Dame feels that I&D and poly exchange with 6 weeks of IV antibiotics and then using oral antibiotic would benefit her compared to further surgery at this time.  Do to the advanced state of her RA and UC and the medications she needs to take, a more conservative route is discussed with the patient.  Risks, benefits and expectations were discussed with the patient. Patient understand the risks, benefits and expectations and wishes to proceed with surgery.   PCP: Hollace Kinnier, DO  D/C Plans:      Home with HHPT  Post-op Meds:       No Rx given   Tranexamic Acid:      To be given - IV   Decadron:      Is to be given   PMH: Past Medical History  Diagnosis Date  . Ulcerative colitis   . Hypothyroidism   . Hypertension     pcp   dr reed   [facility]  . Pneumonia 2008  . Chronic bronchitis 10/31/2011    "prone to it; I have sinus/bronchitis problem probably q yr"  . Multiple skin tears 10/31/2011    "get them very easily; my skin is thin and I bruise easily"  . Chronic back pain     "my entire spine"  . Edema   . Other and unspecified hyperlipidemia   . Encounter for long-term (current) use of other medications   . Senile osteoporosis   . Spinal stenosis, unspecified region other than cervical   . Unspecified  glaucoma   . Hypothyroidism   . Complication of anesthesia 2009    htn with block- for hand surgery   . Anxiety     sometimes   . Rheumatoid arthritis(714.0)     degenerative disc disease     PSH: Past Surgical History  Procedure Laterality Date  . Back surgery    . Sinus surgery with instatrak    . Thyroidectomy, partial  1997  . Posterior fusion lumbar spine  10/31/2011    L2-3; 3-4  . Tonsillectomy      "when I was a small child"  . Functional endoscopic sinus surgery  1988  . Posterior fusion lumbar spine  11/2009    L3-4;  L4-5  . Joint replacement  2009; 2006    joints 2 fingers,rt hand; joint left thumb  . Spine surgery    . Posterior lumbar fusion N/A 08/05/13    T1, T2  . Eye surgery    . Anterior cervical decomp/discectomy fusion N/A 02/17/2014    Procedure: CERVICAL FOUR TO FIVE ANTERIOR CERVICAL DECOMPRESSION/DISCECTOMY FUSION 1 LEVEL;  Surgeon: [name], MD;  Location: [facility];  Service: Neurosurgery;  Laterality:  N/A;  C4-5 Anterior cervical decompression/diskectomy/fusion  . Cataract extraction Left 11/11/2013  . Cataract extraction Right 12/30/2013  . Total knee arthroplasty Left 06/16/2014    Procedure: TOTAL KNEE ARTHROPLASTY;  Surgeon: Paralee Cancel, MD;  Location: [facility];  Service: Orthopedics;  Laterality: Left;    Social History:  reports that she has been passively smoking Cigarettes.  She has a 12.5 pack-year smoking history. She uses smokeless tobacco. She reports that she drinks about 2.4 oz of alcohol per week. She reports that she does not use illicit drugs.  Allergies:  No Known Allergies  Medications: No current facility-administered medications for this encounter.   Current Outpatient Prescriptions  Medication Sig Dispense Refill  . aspirin EC 81 MG tablet Take 81 mg by mouth every morning.     . balsalazide (COLAZAL) 750 MG capsule Take 2,250 mg by mouth 2 (two) times daily.     . Calcium Carb-Cholecalciferol (CALCIUM + D3) 600-200  MG-UNIT TABS Take 1 tablet by mouth daily.    . CELEBREX 200 MG capsule Take 200 mg by mouth 2 (two) times daily.     . Cholecalciferol (VITAMIN D3) 1000 UNITS CAPS Take 1,000 Units by mouth daily.    . collagenase (SANTYL) ointment Apply topically daily. (Patient not taking: Reported on 10/05/2014) 15 g 0  . diazepam (VALIUM) 5 MG tablet Take 1 tablet (5 mg total) by mouth every 6 (six) hours as needed for muscle spasms. 20 tablet 0  . estradiol (ESTRACE) 0.5 MG tablet Take 0.5 mg by mouth at bedtime.     . folic acid (FOLVITE) 1 MG tablet Take 2 mg by mouth daily.    . furosemide (LASIX) 20 MG tablet Take 20 mg by mouth daily as needed for fluid or edema.   1  . furosemide (LASIX) 20 MG tablet 1 BY MOUTH DAILY AS NEEDED 30 tablet 1  . guaiFENesin (MUCINEX) 600 MG 12 hr tablet Take 1,200 mg by mouth daily as needed (sinus relief).     Marland Kitchen HYDROcodone-acetaminophen (NORCO/VICODIN) 5-325 MG per tablet Take 1 tablet by mouth every 6 (six) hours as needed for moderate pain (pain). 30 tablet 0  . latanoprost (XALATAN) 0.005 % ophthalmic solution Place 1 drop into both eyes at bedtime.    Marland Kitchen leflunomide (ARAVA) 20 MG tablet Take 20 mg by mouth every morning.   1  . levothyroxine (SYNTHROID) 88 MCG tablet Take 88 mcg by mouth daily before breakfast. DAW, DO NOT SUBSTITUTE WITH GENERIC    . medroxyPROGESTERone (PROVERA) 2.5 MG tablet Take 2.5 mg by mouth at bedtime.     . Multiple Vitamin (MULTIVITAMIN WITH MINERALS) TABS Take 1 tablet by mouth daily.    . Omega-3 Fatty Acids (FISH OIL) 1200 MG CAPS Take 1,200 mg by mouth daily.    Marland Kitchen oxyCODONE-acetaminophen (PERCOCET/ROXICET) 5-325 MG per tablet Take 1-2 tablets by mouth every 8 (eight) hours as needed for severe pain. (Patient taking differently: Take 1-2 tablets by mouth every 6 (six) hours as needed for severe pain. ) 30 tablet 0  . Potassium Gluconate 595 MG CAPS Take 1 capsule by mouth daily as needed (with furosemide.).    Marland Kitchen predniSONE (DELTASONE) 5 MG  tablet Take 5 mg by mouth daily with breakfast.          Review of Systems  Constitutional: Negative.   HENT: Negative.   Eyes: Negative.   Respiratory: Negative.   Cardiovascular: Negative.   Gastrointestinal: Negative.   Genitourinary: Negative.   Musculoskeletal:  Positive for back pain and joint pain.  Skin: Negative.   Neurological: Negative.   Endo/Heme/Allergies: Negative.   Psychiatric/Behavioral: The patient is nervous/anxious.        Physical Exam  Constitutional: She is oriented to person, place, and time. She appears well-developed.  HENT:  Head: Normocephalic.  Eyes: Pupils are equal, round, and reactive to light.  Neck: Neck supple. No JVD present. No tracheal deviation present. No thyromegaly present.  Cardiovascular: Normal rate, regular rhythm, normal heart sounds and intact distal pulses.   Respiratory: Effort normal and breath sounds normal.  GI: Soft. There is no tenderness. There is no guarding.  Musculoskeletal:       Left knee: She exhibits decreased range of motion, swelling and laceration (healed previous incision). She exhibits no deformity, no erythema and normal alignment. Tenderness found.  Lymphadenopathy:    She has no cervical adenopathy.  Neurological: She is alert and oriented to person, place, and time.  Skin: Skin is warm and dry.  Psychiatric: She has a normal mood and affect.       Assessment/Plan Assessment:     Infected left total knee arthroplasty   Plan: Patient will undergo an I&D and poly exchange of the left total knee arthroplasty on 10/13/2014 per Dr. Alvan Dame at [facility]. Risks benefits and expectations were discussed with the patient. Patient understand risks, benefits and expectations and wishes to proceed.     [place] Pugh Bryson Gavia   PA-C  10/12/2014, 12:01 PM

## 2014-10-12 NOTE — Progress Notes (Signed)
Please put orders in Epic for Same day surgery tomorrow, 10-13-14  Thanks

## 2014-10-13 ENCOUNTER — Inpatient Hospital Stay (HOSPITAL_COMMUNITY)
Admission: AD | Admit: 2014-10-13 | Discharge: 2014-10-15 | DRG: 464 | Disposition: A | Discharge status: Skilled Nursing Facility | Payer: Medicare Other | Source: Ambulatory Visit | Attending: Orthopedic Surgery | Admitting: Orthopedic Surgery

## 2014-10-13 ENCOUNTER — Encounter (HOSPITAL_COMMUNITY): Payer: Self-pay | Admitting: *Deleted

## 2014-10-13 ENCOUNTER — Inpatient Hospital Stay (HOSPITAL_COMMUNITY): Payer: Medicare Other | Admitting: Certified Registered Nurse Anesthetist

## 2014-10-13 ENCOUNTER — Encounter (HOSPITAL_COMMUNITY)
Admission: AD | Disposition: A | Discharge status: Skilled Nursing Facility | Payer: Self-pay | Source: Ambulatory Visit | Attending: Orthopedic Surgery

## 2014-10-13 DIAGNOSIS — K5190 Ulcerative colitis, unspecified, without complications: Secondary | ICD-10-CM | POA: Diagnosis present

## 2014-10-13 DIAGNOSIS — Z801 Family history of malignant neoplasm of trachea, bronchus and lung: Secondary | ICD-10-CM | POA: Diagnosis not present

## 2014-10-13 DIAGNOSIS — M549 Dorsalgia, unspecified: Secondary | ICD-10-CM | POA: Diagnosis present

## 2014-10-13 DIAGNOSIS — Z8701 Personal history of pneumonia (recurrent): Secondary | ICD-10-CM

## 2014-10-13 DIAGNOSIS — E039 Hypothyroidism, unspecified: Secondary | ICD-10-CM | POA: Diagnosis present

## 2014-10-13 DIAGNOSIS — M069 Rheumatoid arthritis, unspecified: Secondary | ICD-10-CM | POA: Diagnosis present

## 2014-10-13 DIAGNOSIS — Z7722 Contact with and (suspected) exposure to environmental tobacco smoke (acute) (chronic): Secondary | ICD-10-CM | POA: Diagnosis present

## 2014-10-13 DIAGNOSIS — T8454XA Infection and inflammatory reaction due to internal left knee prosthesis, initial encounter: Principal | ICD-10-CM | POA: Diagnosis present

## 2014-10-13 DIAGNOSIS — Z79899 Other long term (current) drug therapy: Secondary | ICD-10-CM

## 2014-10-13 DIAGNOSIS — L905 Scar conditions and fibrosis of skin: Secondary | ICD-10-CM | POA: Diagnosis present

## 2014-10-13 DIAGNOSIS — G8929 Other chronic pain: Secondary | ICD-10-CM | POA: Diagnosis present

## 2014-10-13 DIAGNOSIS — Z7982 Long term (current) use of aspirin: Secondary | ICD-10-CM

## 2014-10-13 DIAGNOSIS — T8459XA Infection and inflammatory reaction due to other internal joint prosthesis, initial encounter: Secondary | ICD-10-CM

## 2014-10-13 DIAGNOSIS — T8450XA Infection and inflammatory reaction due to unspecified internal joint prosthesis, initial encounter: Secondary | ICD-10-CM

## 2014-10-13 DIAGNOSIS — L03116 Cellulitis of left lower limb: Secondary | ICD-10-CM | POA: Diagnosis present

## 2014-10-13 DIAGNOSIS — M810 Age-related osteoporosis without current pathological fracture: Secondary | ICD-10-CM | POA: Diagnosis present

## 2014-10-13 DIAGNOSIS — M25562 Pain in left knee: Secondary | ICD-10-CM | POA: Diagnosis present

## 2014-10-13 DIAGNOSIS — D899 Disorder involving the immune mechanism, unspecified: Secondary | ICD-10-CM | POA: Diagnosis present

## 2014-10-13 DIAGNOSIS — Z79891 Long term (current) use of opiate analgesic: Secondary | ICD-10-CM

## 2014-10-13 DIAGNOSIS — Z96652 Presence of left artificial knee joint: Secondary | ICD-10-CM | POA: Diagnosis present

## 2014-10-13 DIAGNOSIS — B9689 Other specified bacterial agents as the cause of diseases classified elsewhere: Secondary | ICD-10-CM | POA: Diagnosis not present

## 2014-10-13 DIAGNOSIS — Y838 Other surgical procedures as the cause of abnormal reaction of the patient, or of later complication, without mention of misadventure at the time of the procedure: Secondary | ICD-10-CM | POA: Diagnosis not present

## 2014-10-13 DIAGNOSIS — F419 Anxiety disorder, unspecified: Secondary | ICD-10-CM | POA: Diagnosis present

## 2014-10-13 DIAGNOSIS — H409 Unspecified glaucoma: Secondary | ICD-10-CM | POA: Diagnosis present

## 2014-10-13 DIAGNOSIS — Z7952 Long term (current) use of systemic steroids: Secondary | ICD-10-CM

## 2014-10-13 DIAGNOSIS — Z8249 Family history of ischemic heart disease and other diseases of the circulatory system: Secondary | ICD-10-CM | POA: Diagnosis not present

## 2014-10-13 DIAGNOSIS — I872 Venous insufficiency (chronic) (peripheral): Secondary | ICD-10-CM | POA: Diagnosis present

## 2014-10-13 DIAGNOSIS — Z96659 Presence of unspecified artificial knee joint: Secondary | ICD-10-CM

## 2014-10-13 DIAGNOSIS — E785 Hyperlipidemia, unspecified: Secondary | ICD-10-CM | POA: Diagnosis present

## 2014-10-13 DIAGNOSIS — I10 Essential (primary) hypertension: Secondary | ICD-10-CM | POA: Diagnosis present

## 2014-10-13 DIAGNOSIS — Y831 Surgical operation with implant of artificial internal device as the cause of abnormal reaction of the patient, or of later complication, without mention of misadventure at the time of the procedure: Secondary | ICD-10-CM | POA: Diagnosis present

## 2014-10-13 HISTORY — PX: I & D KNEE WITH POLY EXCHANGE: SHX5024

## 2014-10-13 LAB — SYNOVIAL CELL COUNT + DIFF, W/ CRYSTALS
CRYSTALS FLUID: NONE SEEN
Lymphocytes-Synovial Fld: 6 % (ref 0–20)
Monocyte-Macrophage-Synovial Fluid: 14 % — ABNORMAL LOW (ref 50–90)
Neutrophil, Synovial: 80 % — ABNORMAL HIGH (ref 0–25)
WBC, Synovial: UNDETERMINED /mm3 (ref 0–200)

## 2014-10-13 LAB — TYPE AND SCREEN
ABO/RH(D): B POS
ANTIBODY SCREEN: NEGATIVE

## 2014-10-13 LAB — URINALYSIS, ROUTINE W REFLEX MICROSCOPIC
BILIRUBIN URINE: NEGATIVE
Glucose, UA: NEGATIVE mg/dL
Hgb urine dipstick: NEGATIVE
KETONES UR: NEGATIVE mg/dL
LEUKOCYTES UA: NEGATIVE
Nitrite: NEGATIVE
Protein, ur: NEGATIVE mg/dL
SPECIFIC GRAVITY, URINE: 1.016 (ref 1.005–1.030)
Urobilinogen, UA: 0.2 mg/dL (ref 0.0–1.0)
pH: 6 (ref 5.0–8.0)

## 2014-10-13 LAB — CBC
HEMATOCRIT: 30.5 % — AB (ref 36.0–46.0)
HEMOGLOBIN: 9.8 g/dL — AB (ref 12.0–15.0)
MCH: 28.7 pg (ref 26.0–34.0)
MCHC: 32.1 g/dL (ref 30.0–36.0)
MCV: 89.2 fL (ref 78.0–100.0)
Platelets: 453 10*3/uL — ABNORMAL HIGH (ref 150–400)
RBC: 3.42 MIL/uL — ABNORMAL LOW (ref 3.87–5.11)
RDW: 16 % — ABNORMAL HIGH (ref 11.5–15.5)
WBC: 6.4 10*3/uL (ref 4.0–10.5)

## 2014-10-13 LAB — BASIC METABOLIC PANEL
ANION GAP: 5 (ref 5–15)
BUN: 16 mg/dL (ref 6–20)
CALCIUM: 7.6 mg/dL — AB (ref 8.9–10.3)
CO2: 24 mmol/L (ref 22–32)
CREATININE: 0.69 mg/dL (ref 0.44–1.00)
Chloride: 110 mmol/L (ref 101–111)
GFR calc Af Amer: >60 mL/min (ref >60)
GLUCOSE: 86 mg/dL (ref 65–99)
POTASSIUM: 4 mmol/L (ref 3.5–5.1)
Sodium: 139 mmol/L (ref 135–145)

## 2014-10-13 LAB — PROTIME-INR
INR: 1.1 (ref 0.00–1.49)
Prothrombin Time: 14.4 seconds (ref 11.6–15.2)

## 2014-10-13 LAB — APTT: aPTT: 29 seconds (ref 24–37)

## 2014-10-13 SURGERY — IRRIGATION AND DEBRIDEMENT KNEE WITH POLY EXCHANGE
Anesthesia: General | Site: Knee | Laterality: Left

## 2014-10-13 MED ORDER — LIDOCAINE HCL (CARDIAC) 20 MG/ML IV SOLN
INTRAVENOUS | Status: DC | PRN
  Administered 2014-10-13: 80 mg via INTRAVENOUS

## 2014-10-13 MED ORDER — METHOCARBAMOL 1000 MG/10ML IJ SOLN
500.0000 mg | Freq: Four times a day (QID) | INTRAVENOUS | Status: DC | PRN
  Filled 2014-10-13: qty 5

## 2014-10-13 MED ORDER — ROCURONIUM BROMIDE 100 MG/10ML IV SOLN
INTRAVENOUS | Status: AC
  Filled 2014-10-13: qty 1

## 2014-10-13 MED ORDER — SODIUM CHLORIDE 0.9 % IR SOLN
Status: DC | PRN
  Administered 2014-10-13: 6000 mL

## 2014-10-13 MED ORDER — ALUM & MAG HYDROXIDE-SIMETH 200-200-20 MG/5ML PO SUSP: 30.0000 mL | ORAL | Status: DC | PRN

## 2014-10-13 MED ORDER — ONDANSETRON HCL 4 MG/2ML IJ SOLN
INTRAMUSCULAR | Status: DC | PRN
  Administered 2014-10-13: 4 mg via INTRAVENOUS

## 2014-10-13 MED ORDER — METOCLOPRAMIDE HCL 10 MG PO TABS: 5.0000 mg | ORAL_TABLET | Freq: Three times a day (TID) | ORAL | Status: DC | PRN

## 2014-10-13 MED ORDER — PHENYLEPHRINE HCL 10 MG/ML IJ SOLN
INTRAMUSCULAR | Status: DC | PRN
  Administered 2014-10-13: 40 ug via INTRAVENOUS

## 2014-10-13 MED ORDER — PROPOFOL 10 MG/ML IV BOLUS
INTRAVENOUS | Status: DC | PRN
  Administered 2014-10-13: 130 mg via INTRAVENOUS

## 2014-10-13 MED ORDER — EPHEDRINE SULFATE 50 MG/ML IJ SOLN
INTRAMUSCULAR | Status: DC | PRN
  Administered 2014-10-13: 10 mg via INTRAVENOUS
  Administered 2014-10-13: 7.5 mg via INTRAVENOUS
  Administered 2014-10-13 (×2): 5 mg via INTRAVENOUS

## 2014-10-13 MED ORDER — COLLAGENASE 250 UNIT/GM EX OINT
1.0000 "application " | TOPICAL_OINTMENT | Freq: Every day | CUTANEOUS | Status: DC | PRN
  Filled 2014-10-13: qty 30

## 2014-10-13 MED ORDER — DOCUSATE SODIUM 100 MG PO CAPS
100.0000 mg | ORAL_CAPSULE | Freq: Two times a day (BID) | ORAL | Status: DC
  Administered 2014-10-14 (×2): 100 mg via ORAL

## 2014-10-13 MED ORDER — METHOCARBAMOL 500 MG PO TABS
500.0000 mg | ORAL_TABLET | Freq: Four times a day (QID) | ORAL | Status: DC | PRN
  Administered 2014-10-13: 500 mg via ORAL
  Filled 2014-10-13: qty 1

## 2014-10-13 MED ORDER — HYDROMORPHONE HCL 1 MG/ML IJ SOLN
INTRAMUSCULAR | Status: AC
  Filled 2014-10-13: qty 1

## 2014-10-13 MED ORDER — MEDROXYPROGESTERONE ACETATE 2.5 MG PO TABS
2.5000 mg | ORAL_TABLET | Freq: Every day | ORAL | Status: DC
  Administered 2014-10-13 – 2014-10-14 (×2): 2.5 mg via ORAL
  Filled 2014-10-13 (×3): qty 1

## 2014-10-13 MED ORDER — VANCOMYCIN HCL IN DEXTROSE 750-5 MG/150ML-% IV SOLN
750.0000 mg | Freq: Two times a day (BID) | INTRAVENOUS | Status: DC
  Administered 2014-10-14 – 2014-10-15 (×3): 750 mg via INTRAVENOUS
  Filled 2014-10-13 (×4): qty 150

## 2014-10-13 MED ORDER — LACTATED RINGERS IV SOLN
INTRAVENOUS | Status: DC
  Administered 2014-10-13 (×2): via INTRAVENOUS

## 2014-10-13 MED ORDER — GLYCOPYRROLATE 0.2 MG/ML IJ SOLN
INTRAMUSCULAR | Status: DC | PRN
  Administered 2014-10-13: 0.4 mg via INTRAVENOUS

## 2014-10-13 MED ORDER — HYDROMORPHONE HCL 1 MG/ML IJ SOLN
0.5000 mg | INTRAMUSCULAR | Status: DC | PRN
  Administered 2014-10-14: 1 mg via INTRAVENOUS
  Filled 2014-10-13: qty 1

## 2014-10-13 MED ORDER — DEXAMETHASONE SODIUM PHOSPHATE 10 MG/ML IJ SOLN
10.0000 mg | Freq: Once | INTRAMUSCULAR | Status: AC
  Administered 2014-10-14: 10 mg via INTRAVENOUS
  Filled 2014-10-13: qty 1

## 2014-10-13 MED ORDER — PREDNISONE 5 MG PO TABS
5.0000 mg | ORAL_TABLET | Freq: Every day | ORAL | Status: DC
  Administered 2014-10-14 – 2014-10-15 (×2): 5 mg via ORAL
  Filled 2014-10-13 (×3): qty 1

## 2014-10-13 MED ORDER — DIPHENHYDRAMINE HCL 25 MG PO CAPS: 25.0000 mg | ORAL_CAPSULE | Freq: Four times a day (QID) | ORAL | Status: DC | PRN

## 2014-10-13 MED ORDER — POLYETHYLENE GLYCOL 3350 17 G PO PACK
17.0000 g | PACK | Freq: Two times a day (BID) | ORAL | Status: DC
  Administered 2014-10-14 (×2): 17 g via ORAL

## 2014-10-13 MED ORDER — PHENOL 1.4 % MT LIQD: 1.0000 | OROMUCOSAL | Status: DC | PRN

## 2014-10-13 MED ORDER — ASPIRIN EC 325 MG PO TBEC
325.0000 mg | DELAYED_RELEASE_TABLET | Freq: Two times a day (BID) | ORAL | Status: DC
  Administered 2014-10-14 – 2014-10-15 (×3): 325 mg via ORAL
  Filled 2014-10-13 (×5): qty 1

## 2014-10-13 MED ORDER — POTASSIUM CHLORIDE 2 MEQ/ML IV SOLN
INTRAVENOUS | Status: DC
  Administered 2014-10-13: 22:00:00 via INTRAVENOUS
  Filled 2014-10-13 (×9): qty 1000

## 2014-10-13 MED ORDER — KETOROLAC TROMETHAMINE 30 MG/ML IJ SOLN: INTRAMUSCULAR | Status: DC | PRN

## 2014-10-13 MED ORDER — SUCCINYLCHOLINE CHLORIDE 20 MG/ML IJ SOLN
INTRAMUSCULAR | Status: DC | PRN
  Administered 2014-10-13: 80 mg via INTRAVENOUS

## 2014-10-13 MED ORDER — NEOSTIGMINE METHYLSULFATE 10 MG/10ML IV SOLN
INTRAVENOUS | Status: DC | PRN
  Administered 2014-10-13: 3 mg via INTRAVENOUS

## 2014-10-13 MED ORDER — ONDANSETRON HCL 4 MG/2ML IJ SOLN
INTRAMUSCULAR | Status: AC
  Filled 2014-10-13: qty 2

## 2014-10-13 MED ORDER — BUPIVACAINE-EPINEPHRINE (PF) 0.25% -1:200000 IJ SOLN: INTRAMUSCULAR | Status: DC | PRN

## 2014-10-13 MED ORDER — FENTANYL CITRATE (PF) 100 MCG/2ML IJ SOLN
INTRAMUSCULAR | Status: AC
  Filled 2014-10-13: qty 4

## 2014-10-13 MED ORDER — GLYCOPYRROLATE 0.2 MG/ML IJ SOLN
INTRAMUSCULAR | Status: AC
  Filled 2014-10-13: qty 3

## 2014-10-13 MED ORDER — ESTRADIOL 1 MG PO TABS
0.5000 mg | ORAL_TABLET | Freq: Every day | ORAL | Status: DC
  Administered 2014-10-13 – 2014-10-14 (×2): 0.5 mg via ORAL
  Filled 2014-10-13 (×3): qty 0.5

## 2014-10-13 MED ORDER — SODIUM CHLORIDE 0.9 % IJ SOLN: INTRAMUSCULAR | Status: DC | PRN

## 2014-10-13 MED ORDER — ACETAMINOPHEN 325 MG PO TABS
650.0000 mg | ORAL_TABLET | Freq: Four times a day (QID) | ORAL | Status: DC | PRN
  Administered 2014-10-15: 650 mg via ORAL
  Filled 2014-10-13: qty 2

## 2014-10-13 MED ORDER — LABETALOL HCL 5 MG/ML IV SOLN
INTRAVENOUS | Status: AC
  Filled 2014-10-13: qty 8

## 2014-10-13 MED ORDER — ACETAMINOPHEN 650 MG RE SUPP: 650.0000 mg | Freq: Four times a day (QID) | RECTAL | Status: DC | PRN

## 2014-10-13 MED ORDER — MENTHOL 3 MG MT LOZG
1.0000 | LOZENGE | OROMUCOSAL | Status: DC | PRN
  Filled 2014-10-13: qty 9

## 2014-10-13 MED ORDER — 0.9 % SODIUM CHLORIDE (POUR BTL) OPTIME
TOPICAL | Status: DC | PRN
  Administered 2014-10-13: 1000 mL

## 2014-10-13 MED ORDER — FENTANYL CITRATE (PF) 250 MCG/5ML IJ SOLN
INTRAMUSCULAR | Status: AC
  Filled 2014-10-13: qty 25

## 2014-10-13 MED ORDER — VANCOMYCIN HCL 1000 MG IV SOLR
INTRAVENOUS | Status: AC
  Filled 2014-10-13: qty 2000

## 2014-10-13 MED ORDER — LABETALOL HCL 5 MG/ML IV SOLN
INTRAVENOUS | Status: DC | PRN
  Administered 2014-10-13: 7.5 mg via INTRAVENOUS
  Administered 2014-10-13: 5 mg via INTRAVENOUS

## 2014-10-13 MED ORDER — CELECOXIB 200 MG PO CAPS
200.0000 mg | ORAL_CAPSULE | Freq: Two times a day (BID) | ORAL | Status: DC
  Administered 2014-10-13 – 2014-10-15 (×4): 200 mg via ORAL
  Filled 2014-10-13 (×5): qty 1

## 2014-10-13 MED ORDER — PROPOFOL 10 MG/ML IV BOLUS
INTRAVENOUS | Status: AC
  Filled 2014-10-13: qty 20

## 2014-10-13 MED ORDER — FENTANYL CITRATE (PF) 100 MCG/2ML IJ SOLN
INTRAMUSCULAR | Status: DC | PRN
  Administered 2014-10-13: 50 ug via INTRAVENOUS
  Administered 2014-10-13: 100 ug via INTRAVENOUS
  Administered 2014-10-13 (×4): 50 ug via INTRAVENOUS

## 2014-10-13 MED ORDER — BALSALAZIDE DISODIUM 750 MG PO CAPS
2250.0000 mg | ORAL_CAPSULE | Freq: Two times a day (BID) | ORAL | Status: DC
  Administered 2014-10-13 – 2014-10-15 (×4): 2250 mg via ORAL
  Filled 2014-10-13 (×6): qty 3

## 2014-10-13 MED ORDER — CHLORHEXIDINE GLUCONATE 4 % EX LIQD: 60.0000 mL | Freq: Once | CUTANEOUS | Status: DC

## 2014-10-13 MED ORDER — ONDANSETRON HCL 4 MG PO TABS: 4.0000 mg | ORAL_TABLET | Freq: Four times a day (QID) | ORAL | Status: DC | PRN

## 2014-10-13 MED ORDER — BUPIVACAINE-EPINEPHRINE (PF) 0.25% -1:200000 IJ SOLN
INTRAMUSCULAR | Status: AC
  Filled 2014-10-13: qty 30

## 2014-10-13 MED ORDER — TRANEXAMIC ACID 1000 MG/10ML IV SOLN
1000.0000 mg | Freq: Once | INTRAVENOUS | Status: AC
  Administered 2014-10-13: 1000 mg via INTRAVENOUS
  Filled 2014-10-13: qty 10

## 2014-10-13 MED ORDER — LEVOTHYROXINE SODIUM 88 MCG PO TABS
88.0000 ug | ORAL_TABLET | Freq: Every day | ORAL | Status: DC
  Administered 2014-10-14 – 2014-10-15 (×2): 88 ug via ORAL
  Filled 2014-10-13 (×4): qty 1

## 2014-10-13 MED ORDER — NEOSTIGMINE METHYLSULFATE 10 MG/10ML IV SOLN
INTRAVENOUS | Status: AC
  Filled 2014-10-13: qty 1

## 2014-10-13 MED ORDER — LIDOCAINE HCL (CARDIAC) 20 MG/ML IV SOLN
INTRAVENOUS | Status: AC
  Filled 2014-10-13: qty 5

## 2014-10-13 MED ORDER — FERROUS SULFATE 325 (65 FE) MG PO TABS
325.0000 mg | ORAL_TABLET | Freq: Three times a day (TID) | ORAL | Status: DC
  Administered 2014-10-14 – 2014-10-15 (×4): 325 mg via ORAL
  Filled 2014-10-13 (×7): qty 1

## 2014-10-13 MED ORDER — KETOROLAC TROMETHAMINE 30 MG/ML IJ SOLN
INTRAMUSCULAR | Status: AC
  Filled 2014-10-13: qty 1

## 2014-10-13 MED ORDER — SODIUM CHLORIDE 0.9 % IJ SOLN
INTRAMUSCULAR | Status: AC
  Filled 2014-10-13: qty 50

## 2014-10-13 MED ORDER — MAGNESIUM CITRATE PO SOLN: 1.0000 | Freq: Once | ORAL | Status: AC | PRN

## 2014-10-13 MED ORDER — VANCOMYCIN HCL 1000 MG IV SOLR
INTRAVENOUS | Status: DC | PRN
  Administered 2014-10-13: 2000 mg via TOPICAL

## 2014-10-13 MED ORDER — ONDANSETRON HCL 4 MG/2ML IJ SOLN: 4.0000 mg | Freq: Four times a day (QID) | INTRAMUSCULAR | Status: DC | PRN

## 2014-10-13 MED ORDER — METOCLOPRAMIDE HCL 5 MG/ML IJ SOLN: 5.0000 mg | Freq: Three times a day (TID) | INTRAMUSCULAR | Status: DC | PRN

## 2014-10-13 MED ORDER — HYDROMORPHONE HCL 1 MG/ML IJ SOLN
0.2500 mg | INTRAMUSCULAR | Status: DC | PRN
  Administered 2014-10-13: 0.5 mg via INTRAVENOUS
  Administered 2014-10-13 (×2): 0.25 mg via INTRAVENOUS

## 2014-10-13 MED ORDER — ROCURONIUM BROMIDE 100 MG/10ML IV SOLN
INTRAVENOUS | Status: DC | PRN
  Administered 2014-10-13: 20 mg via INTRAVENOUS
  Administered 2014-10-13: 10 mg via INTRAVENOUS

## 2014-10-13 MED ORDER — DIAZEPAM 5 MG PO TABS
5.0000 mg | ORAL_TABLET | Freq: Four times a day (QID) | ORAL | Status: DC | PRN
  Administered 2014-10-14: 5 mg via ORAL
  Filled 2014-10-13: qty 1

## 2014-10-13 MED ORDER — DEXAMETHASONE SODIUM PHOSPHATE 10 MG/ML IJ SOLN
10.0000 mg | Freq: Once | INTRAMUSCULAR | Status: AC
  Administered 2014-10-13: 10 mg via INTRAVENOUS

## 2014-10-13 MED ORDER — OXYCODONE HCL 5 MG PO TABS
5.0000 mg | ORAL_TABLET | ORAL | Status: DC
  Administered 2014-10-14 (×5): 10 mg via ORAL
  Administered 2014-10-14: 15 mg via ORAL
  Administered 2014-10-15 (×2): 10 mg via ORAL
  Filled 2014-10-13: qty 2
  Filled 2014-10-13: qty 3
  Filled 2014-10-13 (×7): qty 2

## 2014-10-13 MED ORDER — BISACODYL 10 MG RE SUPP: 10.0000 mg | Freq: Every day | RECTAL | Status: DC | PRN

## 2014-10-13 MED ORDER — VANCOMYCIN HCL IN DEXTROSE 1-5 GM/200ML-% IV SOLN
1000.0000 mg | INTRAVENOUS | Status: AC
  Administered 2014-10-13: 1000 mg via INTRAVENOUS
  Filled 2014-10-13: qty 200

## 2014-10-13 SURGICAL SUPPLY — 62 items
BAG SPEC THK2 15X12 ZIP CLS (MISCELLANEOUS) ×1
BAG ZIPLOCK 12X15 (MISCELLANEOUS) ×3 IMPLANT
BANDAGE ELASTIC 4 VELCRO ST LF (GAUZE/BANDAGES/DRESSINGS) ×2 IMPLANT
BANDAGE ELASTIC 6 VELCRO ST LF (GAUZE/BANDAGES/DRESSINGS) ×3 IMPLANT
BANDAGE ESMARK 6X9 LF (GAUZE/BANDAGES/DRESSINGS) ×1 IMPLANT
BNDG CMPR 9X6 STRL LF SNTH (GAUZE/BANDAGES/DRESSINGS) ×1
BNDG ESMARK 6X9 LF (GAUZE/BANDAGES/DRESSINGS) ×3
CUFF TOURN SGL QUICK 34 (TOURNIQUET CUFF) ×3
CUFF TRNQT CYL 34X4X40X1 (TOURNIQUET CUFF) ×1 IMPLANT
DECANTER SPIKE VIAL GLASS SM (MISCELLANEOUS) ×3 IMPLANT
DRAPE EXTREMITY T 121X128X90 (DRAPE) ×3 IMPLANT
DRAPE POUCH INSTRU U-SHP 10X18 (DRAPES) ×3 IMPLANT
DRAPE U-SHAPE 47X51 STRL (DRAPES) ×3 IMPLANT
DRSG ADAPTIC 3X8 NADH LF (GAUZE/BANDAGES/DRESSINGS) IMPLANT
DRSG AQUACEL AG ADV 3.5X10 (GAUZE/BANDAGES/DRESSINGS) ×2 IMPLANT
DRSG AQUACEL AG ADV 3.5X14 (GAUZE/BANDAGES/DRESSINGS) IMPLANT
DRSG PAD ABDOMINAL 8X10 ST (GAUZE/BANDAGES/DRESSINGS) IMPLANT
DURAPREP 26ML APPLICATOR (WOUND CARE) ×4 IMPLANT
ELECT REM PT RETURN 9FT ADLT (ELECTROSURGICAL) ×3
ELECTRODE REM PT RTRN 9FT ADLT (ELECTROSURGICAL) ×1 IMPLANT
FACESHIELD WRAPAROUND (MASK) ×12 IMPLANT
FACESHIELD WRAPAROUND OR TEAM (MASK) ×5 IMPLANT
GAUZE SPONGE 4X4 12PLY STRL (GAUZE/BANDAGES/DRESSINGS) ×2 IMPLANT
GLOVE BIOGEL PI IND STRL 7.5 (GLOVE) ×1 IMPLANT
GLOVE BIOGEL PI IND STRL 8.5 (GLOVE) ×1 IMPLANT
GLOVE BIOGEL PI INDICATOR 7.5 (GLOVE) ×2
GLOVE BIOGEL PI INDICATOR 8.5 (GLOVE) ×2
GLOVE ECLIPSE 8.0 STRL XLNG CF (GLOVE) ×7 IMPLANT
GLOVE ORTHO TXT STRL SZ7.5 (GLOVE) ×7 IMPLANT
GOWN SPEC L3 XXLG W/TWL (GOWN DISPOSABLE) ×3 IMPLANT
GOWN STRL REUS W/TWL LRG LVL3 (GOWN DISPOSABLE) ×3 IMPLANT
HANDPIECE INTERPULSE COAX TIP (DISPOSABLE) ×3
KIT BASIN OR (CUSTOM PROCEDURE TRAY) ×3 IMPLANT
LIQUID BAND (GAUZE/BANDAGES/DRESSINGS) ×2 IMPLANT
MANIFOLD NEPTUNE II (INSTRUMENTS) ×3 IMPLANT
NDL SAFETY ECLIPSE 18X1.5 (NEEDLE) ×1 IMPLANT
NEEDLE HYPO 18GX1.5 SHARP (NEEDLE) ×9
NS IRRIG 1000ML POUR BTL (IV SOLUTION) ×4 IMPLANT
PACK TOTAL JOINT (CUSTOM PROCEDURE TRAY) ×3 IMPLANT
PADDING CAST COTTON 6X4 STRL (CAST SUPPLIES) IMPLANT
PLATE ROT INSERT 12.5MM SIZE 3 (Plate) ×2 IMPLANT
POSITIONER SURGICAL ARM (MISCELLANEOUS) ×3 IMPLANT
SET HNDPC FAN SPRY TIP SCT (DISPOSABLE) ×1 IMPLANT
SPONGE LAP 18X18 X RAY DECT (DISPOSABLE) IMPLANT
STAPLER VISISTAT 35W (STAPLE) IMPLANT
SUCTION FRAZIER 12FR DISP (SUCTIONS) ×1 IMPLANT
SUT MNCRL AB 3-0 PS2 18 (SUTURE) IMPLANT
SUT MNCRL AB 4-0 PS2 18 (SUTURE) ×2 IMPLANT
SUT PDS AB 1 CT1 27 (SUTURE) ×6 IMPLANT
SUT VIC AB 1 CT1 36 (SUTURE) ×3 IMPLANT
SUT VIC AB 2-0 CT1 27 (SUTURE) ×9
SUT VIC AB 2-0 CT1 TAPERPNT 27 (SUTURE) ×3 IMPLANT
SUT VLOC 180 0 24IN GS25 (SUTURE) IMPLANT
SWAB COLLECTION DEVICE MRSA (MISCELLANEOUS) ×2 IMPLANT
SYR 50ML LL SCALE MARK (SYRINGE) ×3 IMPLANT
SYR CONTROL 10ML LL (SYRINGE) ×3 IMPLANT
SYRINGE 20CC LL (MISCELLANEOUS) ×2 IMPLANT
TOWEL OR 17X26 10 PK STRL BLUE (TOWEL DISPOSABLE) ×3 IMPLANT
TRAY FOLEY W/METER SILVER 14FR (SET/KITS/TRAYS/PACK) ×3 IMPLANT
TUBE ANAEROBIC SPECIMEN COL (MISCELLANEOUS) ×2 IMPLANT
WATER STERILE IRR 1500ML POUR (IV SOLUTION) ×1 IMPLANT
WRAP KNEE MAXI GEL POST OP (GAUZE/BANDAGES/DRESSINGS) ×3 IMPLANT

## 2014-10-13 NOTE — Progress Notes (Signed)
ANTIBIOTIC CONSULT NOTE - INITIAL  Pharmacy Consult for Vancomycin Indication: Infected Left TKA  No Known Allergies  Patient Measurements: Height: 5\' 8"  (172.7 cm) Weight: 130 lb 9.6 oz (59.24 kg) IBW/kg (Calculated) : 63.9  Vital Signs: Temp: 98.2 F (36.8 C) (08/02 1954) Temp Source: Oral (08/02 1348) BP: 162/59 mmHg (08/02 1954) Pulse Rate: 54 (08/02 1955) Intake/Output from previous day:   Intake/Output from this shift: Total I/O In: 109.2 [I.V.:109.2] Out: 400 [Urine:400]  Labs:  Recent Labs  10/13/14 1430  WBC 6.4  HGB 9.8*  PLT 453*  CREATININE 0.69   Estimated Creatinine Clearance: 55 mL/min (by C-G formula based on Cr of 0.69). No results for input(s): VANCOTROUGH, VANCOPEAK, VANCORANDOM, GENTTROUGH, GENTPEAK, GENTRANDOM, TOBRATROUGH, TOBRAPEAK, TOBRARND, AMIKACINPEAK, AMIKACINTROU, AMIKACIN in the last 72 hours.   Microbiology: Recent Results (from the past 720 hour(s))  Culture, blood (routine x 2)     Status: None   Collection Time: 09/25/14  4:30 PM  Result Value Ref Range Status   Specimen Description BLOOD RIGHT ARM  Final   Special Requests BOTTLES DRAWN AEROBIC AND ANAEROBIC [place]  Final   Culture   Final    NO GROWTH 5 DAYS Performed at [facility]    Report Status 09/30/2014 FINAL  Final  Culture, blood (routine x 2)     Status: None   Collection Time: 09/25/14  4:40 PM  Result Value Ref Range Status   Specimen Description BLOOD LEFT ARM  Final   Special Requests BOTTLES DRAWN AEROBIC AND ANAEROBIC 10CC  Final   Culture   Final    NO GROWTH 5 DAYS Performed at [facility]    Report Status 09/30/2014 FINAL  Final    Medical History: Past Medical History  Diagnosis Date  . Ulcerative colitis   . Hypothyroidism   . Hypertension     pcp   dr reed   [facility]  . Pneumonia 2008  . Chronic bronchitis 10/31/2011    "prone to it; I have sinus/bronchitis problem probably q yr"  . Multiple skin tears 10/31/2011   "get them very easily; my skin is thin and I bruise easily"  . Chronic back pain     "my entire spine"  . Edema   . Other and unspecified hyperlipidemia   . Encounter for long-term (current) use of other medications   . Senile osteoporosis   . Spinal stenosis, unspecified region other than cervical   . Unspecified glaucoma   . Hypothyroidism   . Complication of anesthesia 2009    htn with block- for hand surgery   . Anxiety     sometimes   . Rheumatoid arthritis(714.0)     degenerative disc disease     Medications:  Scheduled:  . [START ON 10/14/2014] aspirin EC  325 mg Oral BID  . balsalazide  2,250 mg Oral BID  . celecoxib  200 mg Oral Q12H  . [START ON 10/14/2014] dexamethasone  10 mg Intravenous Once  . [START ON 10/14/2014] docusate sodium  100 mg Oral BID  . estradiol  0.5 mg Oral QHS  . [START ON 10/14/2014] ferrous sulfate  325 mg Oral TID PC  . HYDROmorphone      . [START ON 10/14/2014] levothyroxine  88 mcg Oral QAC breakfast  . medroxyPROGESTERone  2.5 mg Oral QHS  . [START ON 10/14/2014] oxyCODONE  5-15 mg Oral Q4H  . [START ON 10/14/2014] polyethylene glycol  17 g Oral BID  . [START ON  10/14/2014] predniSONE  5 mg Oral Q breakfast   Infusions:  . sodium chloride 0.9 % 1,000 mL with potassium chloride 10 mEq infusion     PRN: acetaminophen **OR** acetaminophen, alum & mag hydroxide-simeth, bisacodyl, collagenase, diazepam, diphenhydrAMINE, HYDROmorphone (DILAUDID) injection, magnesium citrate, menthol-cetylpyridinium **OR** phenol, methocarbamol **OR** methocarbamol (ROBAXIN)  IV, metoCLOPramide **OR** metoCLOPramide (REGLAN) injection, ondansetron **OR** ondansetron (ZOFRAN) IV  Assessment: 80 yo female s/p  I&D and poly exchange of the left total knee arthroplasty. Pharmacy is consulted to dose vancomycin post-op.  Received Vancomycin 1g IV given pre-op at 15:07.  8/2 >> Vancomycin >>  Tmax: Afebrile WBC: 6.4k Renal: SCr 0.69, CrCl 55 ml/min CG, 66 ml/min/1.30m2  (normalized)  8/2 blood x 2:  8/2 wound culture: 8/2 anaerobic culture:  Goal of Therapy:  Vancomycin trough level 15-20 mcg/ml  Plan:   Vancomycin 750mg  IV q12h Check trough at steady state Follow up renal function & cultures, clinical course  Peggyann Juba, PharmD, BCPS Pager: [phone] 10/13/2014,8:25 PM

## 2014-10-13 NOTE — Anesthesia Postprocedure Evaluation (Signed)
  Anesthesia Post-op Note  [name]  Procedure(s) Performed: Procedure(s): INCISION AND DRAINAGE LEFT TOTAL KNEE WITH POLY EXCHANGE (Left)  Patient Location: PACU  Anesthesia Type:General  Level of Consciousness: awake  Airway and Oxygen Therapy: Patient Spontanous Breathing  Post-op Pain: mild  Post-op Assessment: Post-op Vital signs reviewed              Post-op Vital Signs: Reviewed  Last Vitals:  Filed Vitals:   10/13/14 1819  BP:   Pulse: 57  Temp:   Resp: 11    Complications: No apparent anesthesia complications

## 2014-10-13 NOTE — Plan of Care (Signed)
Problem: Consults Goal: Diagnosis- Total Joint Replacement Revision Total Knee     

## 2014-10-13 NOTE — Anesthesia Preprocedure Evaluation (Addendum)
Anesthesia Evaluation  Patient identified by MRN, date of birth, ID band Patient awake    Reviewed: Allergy & Precautions, NPO status , Patient's Chart, lab work & pertinent test results  Airway Mallampati: II  TM Distance: >3 FB Neck ROM: Full    Dental   Pulmonary pneumonia -,  breath sounds clear to auscultation        Cardiovascular hypertension, Rhythm:Regular Rate:Normal     Neuro/Psych    GI/Hepatic Neg liver ROS, PUD,   Endo/Other  Hypothyroidism   Renal/GU negative Renal ROS     Musculoskeletal  (+) Arthritis -,   Abdominal   Peds  Hematology   Anesthesia Other Findings   Reproductive/Obstetrics                            Anesthesia Physical Anesthesia Plan  ASA: III  Anesthesia Plan: General   Post-op Pain Management:    Induction: Intravenous  Airway Management Planned: Oral ETT  Additional Equipment:   Intra-op Plan:   Post-operative Plan: Extubation in OR  Informed Consent: I have reviewed the patients History and Physical, chart, labs and discussed the procedure including the risks, benefits and alternatives for the proposed anesthesia with the patient or authorized representative who has indicated his/her understanding and acceptance.   Dental advisory given  Plan Discussed with: CRNA and Anesthesiologist  Anesthesia Plan Comments:         Anesthesia Quick Evaluation

## 2014-10-13 NOTE — Transfer of Care (Signed)
Immediate Anesthesia Transfer of Care Note  [name]  Procedure(s) Performed: Procedure(s): INCISION AND DRAINAGE LEFT TOTAL KNEE WITH POLY EXCHANGE (Left)  Patient Location: PACU  Anesthesia Type:General  Level of Consciousness: awake, alert  and oriented  Airway & Oxygen Therapy: Patient Spontanous Breathing and Patient connected to face mask oxygen  Post-op Assessment: Report given to RN and Post -op Vital signs reviewed and stable  Post vital signs: Reviewed and stable  Last Vitals:  Filed Vitals:   10/13/14 1348  BP: 165/79  Pulse: 66  Temp: 36.9 C  Resp: 16    Complications: No apparent anesthesia complications

## 2014-10-13 NOTE — Anesthesia Procedure Notes (Signed)
Procedure Name: Intubation Date/Time: 10/13/2014 4:38 PM Performed by: Montel Clock Pre-anesthesia Checklist: Patient identified, Emergency Drugs available, Suction available, Patient being monitored and Timeout performed Patient Re-evaluated:Patient Re-evaluated prior to inductionOxygen Delivery Method: Circle system utilized Preoxygenation: Pre-oxygenation with 100% oxygen Intubation Type: IV induction Ventilation: Mask ventilation without difficulty Laryngoscope Size: Mac and 3 Grade View: Grade I Tube type: Oral Tube size: 7.5 mm Number of attempts: 1 Airway Equipment and Method: Stylet Placement Confirmation: ETT inserted through vocal cords under direct vision,  positive ETCO2 and breath sounds checked- equal and bilateral Secured at: 21 cm Tube secured with: Tape Dental Injury: Teeth and Oropharynx as per pre-operative assessment

## 2014-10-13 NOTE — Interval H&P Note (Signed)
History and Physical Interval Note:  10/13/2014 3:23 PM  [name]  has presented today for surgery, with the diagnosis of INFECTED LEFT TOTAL KNEE REPLACEMENT   The various methods of treatment have been discussed with the patient and family. After consideration of risks, benefits and other options for treatment, the patient has consented to  Procedure(s): INCIISION AND DRAINAGE LEFT TOTAL KNEE WITH POLY EXCHANGE (Left) as a surgical intervention .  The patient's history has been reviewed, patient examined, no change in status, stable for surgery.  I have reviewed the patient's chart and labs.  Questions were answered to the patient's satisfaction.     Mauri Pole

## 2014-10-14 ENCOUNTER — Encounter (HOSPITAL_COMMUNITY): Payer: Self-pay | Admitting: Orthopedic Surgery

## 2014-10-14 ENCOUNTER — Encounter (HOSPITAL_BASED_OUTPATIENT_CLINIC_OR_DEPARTMENT_OTHER): Payer: Medicare Other | Attending: Surgery

## 2014-10-14 DIAGNOSIS — B9689 Other specified bacterial agents as the cause of diseases classified elsewhere: Secondary | ICD-10-CM

## 2014-10-14 DIAGNOSIS — L03116 Cellulitis of left lower limb: Secondary | ICD-10-CM

## 2014-10-14 DIAGNOSIS — N179 Acute kidney failure, unspecified: Secondary | ICD-10-CM | POA: Insufficient documentation

## 2014-10-14 DIAGNOSIS — Y838 Other surgical procedures as the cause of abnormal reaction of the patient, or of later complication, without mention of misadventure at the time of the procedure: Secondary | ICD-10-CM

## 2014-10-14 DIAGNOSIS — H409 Unspecified glaucoma: Secondary | ICD-10-CM | POA: Insufficient documentation

## 2014-10-14 DIAGNOSIS — N189 Chronic kidney disease, unspecified: Secondary | ICD-10-CM | POA: Insufficient documentation

## 2014-10-14 DIAGNOSIS — I5032 Chronic diastolic (congestive) heart failure: Secondary | ICD-10-CM | POA: Insufficient documentation

## 2014-10-14 DIAGNOSIS — K208 Other esophagitis: Secondary | ICD-10-CM

## 2014-10-14 DIAGNOSIS — T8454XA Infection and inflammatory reaction due to internal left knee prosthesis, initial encounter: Principal | ICD-10-CM

## 2014-10-14 DIAGNOSIS — K5190 Ulcerative colitis, unspecified, without complications: Secondary | ICD-10-CM | POA: Insufficient documentation

## 2014-10-14 DIAGNOSIS — R601 Generalized edema: Secondary | ICD-10-CM | POA: Insufficient documentation

## 2014-10-14 DIAGNOSIS — L97821 Non-pressure chronic ulcer of other part of left lower leg limited to breakdown of skin: Secondary | ICD-10-CM | POA: Insufficient documentation

## 2014-10-14 DIAGNOSIS — D631 Anemia in chronic kidney disease: Secondary | ICD-10-CM | POA: Insufficient documentation

## 2014-10-14 DIAGNOSIS — L97811 Non-pressure chronic ulcer of other part of right lower leg limited to breakdown of skin: Secondary | ICD-10-CM | POA: Insufficient documentation

## 2014-10-14 DIAGNOSIS — I129 Hypertensive chronic kidney disease with stage 1 through stage 4 chronic kidney disease, or unspecified chronic kidney disease: Secondary | ICD-10-CM | POA: Insufficient documentation

## 2014-10-14 DIAGNOSIS — M069 Rheumatoid arthritis, unspecified: Secondary | ICD-10-CM | POA: Insufficient documentation

## 2014-10-14 DIAGNOSIS — M1990 Unspecified osteoarthritis, unspecified site: Secondary | ICD-10-CM | POA: Insufficient documentation

## 2014-10-14 DIAGNOSIS — E039 Hypothyroidism, unspecified: Secondary | ICD-10-CM | POA: Insufficient documentation

## 2014-10-14 LAB — BASIC METABOLIC PANEL
ANION GAP: 7 (ref 5–15)
BUN: 15 mg/dL (ref 6–20)
CALCIUM: 7.9 mg/dL — AB (ref 8.9–10.3)
CO2: 24 mmol/L (ref 22–32)
Chloride: 108 mmol/L (ref 101–111)
Creatinine, Ser: 0.69 mg/dL (ref 0.44–1.00)
GFR calc non Af Amer: >60 mL/min (ref >60)
Glucose, Bld: 202 mg/dL — ABNORMAL HIGH (ref 65–99)
Potassium: 4.7 mmol/L (ref 3.5–5.1)
Sodium: 139 mmol/L (ref 135–145)

## 2014-10-14 LAB — CBC
HCT: 28.7 % — ABNORMAL LOW (ref 36.0–46.0)
Hemoglobin: 9 g/dL — ABNORMAL LOW (ref 12.0–15.0)
MCH: 28.2 pg (ref 26.0–34.0)
MCHC: 31.4 g/dL (ref 30.0–36.0)
MCV: 90 fL (ref 78.0–100.0)
Platelets: 409 10*3/uL — ABNORMAL HIGH (ref 150–400)
RBC: 3.19 MIL/uL — ABNORMAL LOW (ref 3.87–5.11)
RDW: 16.2 % — ABNORMAL HIGH (ref 11.5–15.5)
WBC: 6.7 10*3/uL (ref 4.0–10.5)

## 2014-10-14 MED ORDER — SODIUM CHLORIDE 0.9 % IJ SOLN
10.0000 mL | INTRAMUSCULAR | Status: DC | PRN
  Administered 2014-10-15: 10 mL
  Filled 2014-10-14: qty 40

## 2014-10-14 NOTE — Clinical Social Work Placement (Signed)
   CLINICAL SOCIAL WORK PLACEMENT  NOTE  Date:  10/14/2014  Patient Details  Name: [name] MRN: [id number] Date of Birth: 01-16-1935  Clinical Social Work is seeking post-discharge placement for this patient at the [place] level of care (*CSW will initial, date and re-position this form in  chart as items are completed):  No   Patient/family provided with [facility] Work Department's list of facilities offering this level of care within the geographic area requested by the patient (or if unable, by the patient's family).  Yes   Patient/family informed of their freedom to choose among providers that offer the needed level of care, that participate in Medicare, Medicaid or managed care program needed by the patient, have an available bed and are willing to accept the patient.  No   Patient/family informed of [facility]'s ownership interest in [facility] and [facility], as well as of the fact that they are under no obligation to receive care at these facilities.  PASRR submitted to EDS on       PASRR number received on       Existing PASRR number confirmed on 10/14/14     FL2 transmitted to all facilities in geographic area requested by pt/family on 10/14/14     FL2 transmitted to all facilities within larger geographic area on       Patient informed that his/her managed care company has contracts with or will negotiate with certain facilities, including the following:        Yes   Patient/family informed of bed offers received.  Patient chooses bed at [place], [place]     Physician recommends and patient chooses bed at      Patient to be transferred to [place], [place] on  .  Patient to be transferred to facility by       Patient family notified on   of transfer.  Name of family member notified:        PHYSICIAN       Additional Comment:    _______________________________________________ [name], [place] 10/14/2014, 2:44 PM

## 2014-10-14 NOTE — Evaluation (Signed)
Occupational Therapy Evaluation Patient Details Name: CHERRIE FRANCA MRN: [id number] DOB: [birth year] Today's Date: 10/14/2014    History of Present Illness Pt admitted for INCISION AND DRAINAGE LEFT TOTAL KNEE WITH POLY EXCHANGE. Pt with PMH of RA, back surgery, L TKA 06/2014 and recent admission on 09/25/2014 for L LE cellultis.    Clinical Impression   Pt up to practice toilet transfer with walker with min assist and min cues for safety. Pt limited with UE use due RA but is highly motivated to do for herself. Feel pt could d/c home but note plan may be for SNF due to antibiotic management.     Follow Up Recommendations  HHOT;Supervision/Assistance - 24 hour (plan may be for SNF however)   Equipment Recommendations  None recommended by OT    Recommendations for Other Services       Precautions / Restrictions Precautions Precautions: Knee;Fall Restrictions Weight Bearing Restrictions: No      Mobility Bed Mobility               General bed mobility comments: in chair when OT arrived.   Transfers Overall transfer level: Needs assistance Equipment used: Rolling walker (2 wheeled) Transfers: Sit to/from Stand Sit to Stand: Min assist         General transfer comment: cues for hand placement.    Balance                                            ADL Overall ADL's : Needs assistance/impaired Eating/Feeding: Set up;Sitting   Grooming: Wash/dry hands;Set up;Sitting   Upper Body Bathing: Set up;Supervision/ safety;Sitting   Lower Body Bathing: Minimal assistance;Sit to/from stand   Upper Body Dressing : Set up;Sitting   Lower Body Dressing: Minimal assistance;Sit to/from stand   Toilet Transfer: Minimal assistance;Ambulation;BSC;RW   Toileting- Clothing Manipulation and Hygiene: Minimal assistance;Sit to/from stand         General ADL Comments: Pt required min cues for safety with the walker in the bathroom to manage tighter space  with walker. She also need cues to back up fully to surface before letting go of the walker to sit down.  Pt is very motivated.      Vision     Perception     Praxis      Pertinent Vitals/Pain Pain Assessment: 0-10 Pain Score: 6  Pain Location: L LE Pain Descriptors / Indicators: Discomfort Pain Intervention(s): Repositioned;Ice applied     Hand Dominance     Extremity/Trunk Assessment Upper Extremity Assessment Upper Extremity Assessment: RUE deficits/detail;LUE deficits/detail RUE Deficits / Details: limited AROM shoulder flexion to approximately 30 degrees. She uses L UE to help lift R UE to perform tasks at home. RA noted in hand.  RUE Coordination: decreased fine motor LUE Deficits / Details: AROM shoulder flexion WFL but some effort. 3+/5 strength grossly throughout. Decreased ability to fully extend L elbow. RA in hand. Pt reports receiving PT on L shoulder only after clavicle fracture.  LUE Coordination: decreased fine motor           Communication Communication Communication: No difficulties   Cognition Arousal/Alertness: Awake/alert Behavior During Therapy: WFL for tasks assessed/performed Overall Cognitive Status: Within Functional Limits for tasks assessed                     General Comments  Exercises       Shoulder Instructions      Home Living Family/patient expects to be discharged to:: Private residence Living Arrangements: Spouse/significant other Available Help at Discharge: Family Type of Home: House Home Access: Stairs to enter [employer] of Steps: 5 or 3 Entrance Stairs-Rails: Right;Left Home Layout: Multi-level;Bed/bath upstairs Alternate Level Stairs-Number of Steps: 16 Alternate Level Stairs-Rails: Right;Left Bathroom Shower/Tub: Occupational psychologist: Handicapped height     Home Equipment: Environmental consultant - 2 wheels;Cane - single point;Bedside commode          Prior Functioning/Environment  Level of Independence: Needs assistance    ADL's / Homemaking Assistance Needed: pt states husband helps with buttons, etc due to the RA in her hands. She has been sponge bathing most recently. Information taken from recent admission.         OT Diagnosis: Generalized weakness   OT Problem List: Decreased strength;Decreased knowledge of use of DME or AE   OT Treatment/Interventions: Self-care/ADL training;Patient/family education;Therapeutic activities;DME and/or AE instruction    OT Goals(Current goals can be found in the care plan section) Acute Rehab OT Goals Patient Stated Goal: increase independence OT Goal Formulation: With patient Time For Goal Achievement: 10/21/14 Potential to Achieve Goals: Good  OT Frequency: Min 2X/week   Barriers to D/C:            Co-evaluation              End of Session Equipment Utilized During Treatment: Rolling walker  Activity Tolerance: Patient tolerated treatment well Patient left: in chair;with call bell/phone within reach   Time: 1120-1146 OT Time Calculation (min): 26 min Charges:  OT General Charges $OT Visit: 1 Procedure OT Evaluation $Initial OT Evaluation Tier I: 1 Procedure OT Treatments $Therapeutic Activity: 8-22 mins G-Codes:    Jules Schick  [phone] 10/14/2014, 12:17 PM

## 2014-10-14 NOTE — Care Management Note (Signed)
Case Management Note  Patient Details  Name: [name] MRN: [id number] Date of Birth: 10-10-1934  Subjective/Objective:                   INCISION AND DRAINAGE LEFT TOTAL KNEE WITH POLY EXCHANGE (Left)  Action/Plan: Discharge planning  Expected Discharge Date:   10/15/14              Expected Discharge Plan:  [place]  In-House Referral:     Discharge planning Services  CM Consult  Post Acute Care Choice:    Choice offered to:     DME Arranged:    DME Agency:     HH Arranged:    [place] Agency:     Status of Service:  Completed, signed off  Medicare Important Message Given:    Date Medicare IM Given:    Medicare IM give by:    Date Additional Medicare IM Given:    Additional Medicare Important Message give by:     If discussed at [place] of Stay Meetings, dates discussed:    Additional Comments: CM met with pt in room to discuss home IV ABX.  Pt has severe RA limiting her ability to perform small motor skill functions (like running her own IV).  Pt's spouse is elderly and pt states is not able to assist.  Pt has no other support system.  CM called [facility] to explain lack of support and unfortunately, [facility] is not able to render Home IV ABX without a teachable support system for pt.  CM explained this to pt who now is willing to go to [facility] for rehab and IV ABX.  CM called CSW , Roselyn Reef to please follow.  No other CM needs were communicated. Dellie Catholic, RN 10/14/2014, 2:32 PM

## 2014-10-14 NOTE — Progress Notes (Signed)
Peripherally Inserted Central Catheter/Midline Placement  The IV Nurse has discussed with the patient and/or persons authorized to consent for the patient, the purpose of this procedure and the potential benefits and risks involved with this procedure.  The benefits include less needle sticks, lab draws from the catheter and patient may be discharged home with the catheter.  Risks include, but not limited to, infection, bleeding, blood clot (thrombus formation), and puncture of an artery; nerve damage and irregular heat beat.  Alternatives to this procedure were also discussed.  PICC/Midline Placement Documentation        [name] 10/14/2014, 1:44 PM

## 2014-10-14 NOTE — Progress Notes (Signed)
     Subjective: 1 Day Post-Op Procedure(s) (LRB): INCISION AND DRAINAGE LEFT TOTAL KNEE WITH POLY EXCHANGE (Left)   Patient reports pain as mild, pain controlled. No events throughout the night. Discussed the PICC line and antibiotics.  Discussed with case worker regarding trying to set up arrangements with her infusions. She has significant RA deformities and knows she wouldn't be able to do the infusions herself.  Objective:   VITALS:   Filed Vitals:   10/14/14 0610  BP: 104/62  Pulse: 64  Temp: 98.2 F (36.8 C)  Resp: 16    Dorsiflexion/Plantar flexion intact Incision: dressing C/D/I Compartment soft  LABS  Recent Labs  10/13/14 1430 10/14/14 0414  HGB 9.8* 9.0*  HCT 30.5* 28.7*  WBC 6.4 6.7  PLT 453* 409*     Recent Labs  10/13/14 1430 10/14/14 0414  NA 139 139  K 4.0 4.7  BUN 16 15  CREATININE 0.69 0.69  GLUCOSE 86 202*     Assessment/Plan: 1 Day Post-Op Procedure(s) (LRB): INCISION AND DRAINAGE LEFT TOTAL KNEE WITH POLY EXCHANGE (Left) Foley cath d/c'ed Wound changes on the LLE wounds ID Consult  PICC line Advance diet Up with therapy D/C IV fluids Discharge home with home health if able to make arrangements. Discharge when able     [name]   PAC  10/14/2014, 10:03 AM

## 2014-10-14 NOTE — Evaluation (Signed)
Physical Therapy Evaluation Patient Details Name: [name] MRN: [id number] DOB: 13-Mar-1935 Today's Date: 10/14/2014   History of Present Illness  Pt admitted for INCISION AND DRAINAGE LEFT TOTAL KNEE WITH POLY EXCHANGE. Pt with PMH of RA, back surgery, L TKA 06/2014 and recent admission on 09/25/2014 for L LE cellultis.   Clinical Impression  Pt s/p L TKR revision presents with decreased L LE strength/ROM and post op pain limiting functional mobility.  Pt would benefit from follow up HHPT vs SNF level rehab dependent on acute stay progress and follow up needs (ie IV antibiotics).    Follow Up Recommendations Home health PT;SNF    Equipment Recommendations  None recommended by PT    Recommendations for Other Services OT consult     Precautions / Restrictions Precautions Precautions: Knee;Fall Restrictions Weight Bearing Restrictions: No Other Position/Activity Restrictions: WBAT      Mobility  Bed Mobility Overal bed mobility: Needs Assistance Bed Mobility: Supine to Sit     Supine to sit: Min guard     General bed mobility comments: in chair when OT arrived.   Transfers Overall transfer level: Needs assistance Equipment used: Rolling walker (2 wheeled) Transfers: Sit to/from Stand Sit to Stand: Min assist         General transfer comment: cues for hand placement.  Ambulation/Gait Ambulation/Gait assistance: Min assist Ambulation Distance (Feet): 150 Feet (2x15' to/from bathroom) Assistive device: Rolling walker (2 wheeled) Gait Pattern/deviations: Step-to pattern;Decreased step length - right;Decreased step length - left;Shuffle;Trunk flexed     General Gait Details: min cues for posture, position from RW and initial seqence  Stairs            Wheelchair Mobility    Modified Rankin (Stroke Patients Only)       Balance                                             Pertinent Vitals/Pain Pain Assessment: 0-10 Pain Score:  6  Pain Location: L LE Pain Descriptors / Indicators: Discomfort Pain Intervention(s): Repositioned;Ice applied    Home Living Family/patient expects to be discharged to:: Private residence Living Arrangements: Spouse/significant other Available Help at Discharge: Family Type of Home: House Home Access: Stairs to enter Entrance Stairs-Rails: Psychiatric nurse of Steps: 5 or 3 Home Layout: Multi-level;Bed/bath upstairs Home Equipment: Environmental consultant - 2 wheels;Cane - single point;Bedside commode Additional Comments: most recently using cane when going out, no AD in the house    Prior Function Level of Independence: Needs assistance      ADL's / Homemaking Assistance Needed: pt states husband helps with buttons, etc due to the RA in her hands. She has been sponge bathing most recently. Information taken from recent admission.         Hand Dominance        Extremity/Trunk Assessment   Upper Extremity Assessment: RUE deficits/detail;LUE deficits/detail RUE Deficits / Details: limited AROM shoulder flexion to approximately 30 degrees. She uses L UE to help lift R UE to perform tasks at home. RA noted in hand.      LUE Deficits / Details: AROM shoulder flexion WFL but some effort. 3+/5 strength grossly throughout. Decreased ability to fully extend L elbow. RA in hand. Pt reports receiving PT on L shoulder only after clavicle fracture.    Lower Extremity Assessment: LLE deficits/detail   LLE Deficits /  Details: 3/5 quads with AAROM at knee -10 - 90     Communication   Communication: No difficulties  Cognition Arousal/Alertness: Awake/alert Behavior During Therapy: WFL for tasks assessed/performed Overall Cognitive Status: Within Functional Limits for tasks assessed                      General Comments      Exercises Total Joint Exercises Ankle Circles/Pumps: AROM;Both;15 reps;Supine Quad Sets: AROM;Both;10 reps;Supine Heel Slides: AAROM;Left;15  reps;Supine Straight Leg Raises: AAROM;AROM;Left;15 reps      Assessment/Plan    PT Assessment Patient needs continued PT services  PT Diagnosis Difficulty walking   PT Problem List Decreased strength;Decreased range of motion;Decreased activity tolerance;Decreased mobility;Decreased knowledge of use of DME;Pain  PT Treatment Interventions DME instruction;Gait training;Functional mobility training;Therapeutic exercise;Patient/family education   PT Goals (Current goals can be found in the Care Plan section) Acute Rehab PT Goals Patient Stated Goal: increase independence PT Goal Formulation: With patient Time For Goal Achievement: 10/03/14 Potential to Achieve Goals: Good    Frequency 7X/week   Barriers to discharge        Co-evaluation               End of Session Equipment Utilized During Treatment: Gait belt Activity Tolerance: Patient tolerated treatment well Patient left: in chair;with call bell/phone within reach Nurse Communication: Mobility status         Time: 1025-1106 PT Time Calculation (min) (ACUTE ONLY): 41 min   Charges:   PT Evaluation $Initial PT Evaluation Tier I: 1 Procedure PT Treatments $Gait Training: 8-22 mins $Therapeutic Exercise: 8-22 mins   PT G Codes:        Semaj Kham 10/29/14, 2:58 PM

## 2014-10-14 NOTE — Clinical Social Work Note (Signed)
Clinical Social Work Assessment  Patient Details  Name: [name] MRN: [id number] Date of Birth: April 18, 1934  Date of referral:  10/14/14               Reason for consult:  Facility Placement, Discharge Planning                Permission sought to share information with:  Chartered certified accountant granted to share information::  Yes, Verbal Permission Granted  Name::        Agency::     Relationship::     Contact Information:     Housing/Transportation Living arrangements for the past 2 months:  Single Family Home Source of Information:  Patient Patient Interpreter Needed:  None Criminal Activity/Legal Involvement Pertinent to Current Situation/Hospitalization:  No - Comment as needed Significant Relationships:  Spouse Lives with:  Spouse Do you feel safe going back to the place where you live?   (SNF placement needed.) Need for family participation in patient care:  No (Coment)  Care giving concerns:  Pt's care cannot be managed at home following hospital d/c.   Social Worker assessment / plan:  Pt hospitalized on 10/13/14 for treatment of infected left total knee arthroplasty. CSW met with pt to assist with d/c planning. Pt was hoping to return home when stable for d/c. Pt will require IV antibiotics and is unable to manage this treatment at home. ST SNF placement will be required. Pt has requested placement at [facility] ( [facility] CSW has contacted SNF and clinicals have been sent for review. [facility] is able to accept pt when stable for d/c. CSW will continue to follow to assist with d/c planning to SNF.  Employment status:  Retired Nurse, adult PT Recommendations:  Not assessed at this time Information / Referral to community resources:  [place]  Patient/Family's Response to care: Pt agrees that SNF placement is needed.  Patient/Family's Understanding of and Emotional Response to Diagnosis, Current Treatment, and  Prognosis: Pt is disappointed that she will not be returning home at d/c due to the need for IV antibiotics at d/c. Pt reports that there is no one to assist with the IV medication administration. Pt has been to [facility] in the past and had a good experience. She pleased there will be an opening for her. Support and reassurance provided. Emotional Assessment Appearance:  Appears stated age Attitude/Demeanor/Rapport:   (cooperative) Affect (typically observed):  Tearful/Crying, Accepting Orientation:  Oriented to Self, Oriented to Place, Oriented to  Time, Oriented to Situation Alcohol / Substance use:  Alcohol Use Psych involvement (Current and /or in the community):  No (Comment)  Discharge Needs  Concerns to be addressed:    Readmission within the last 30 days:  Yes Current discharge risk:  None Barriers to Discharge:  No Barriers Identified   [name], [place]  10/14/2014, 2:27 PM

## 2014-10-14 NOTE — Consult Note (Signed)
[facility]  Total days of antibiotics 2        Day 2 vanco               Reason for Consult: early PJI    Referring Physician: olin  Principal Problem:   Infection of prosthetic left knee joint Active Problems:   Infection of total knee replacement    HPI: [name] is a 80 y.o. female with hx of RA, UC, hypothyroidism, on humara (on hold), pred 5 mg daily, and celecoxib who underwent left TKA in early April 2016. Post operative did well initially with exception to repeated bouts of lowe extremity ulcers and cellulitis to left leg. She had cellulitis treated with a course of bactrim in June as an out patient, followed by having to be admitted in mid July for worsening cellulitis, treated initially with IV clindamycin then transitioned to orals for a total of 10 days. She states that she suffered discomfort with swallowing associated with oral clindamycin (possible pill esophagitis) She now has developed worsening left knee pain and swelling.she was seen in dr. Aurea Graff office initially for evaluation of her knee. She underwent arthrocentesis which revealed   24,000 WBC. due to recent bouts of cellulitis from a leg wound, there is the thought that she may have secondarily infected left total knee arthroplasty. she underwent an I&D and poly exchange with intent to treat as an early PJI with 6 weeks of IV antibiotics followed by oral antibiotic to finish 6 month course of treatment. She remains afebrile. Cultures pending.  Left shoulder pain where she has received few injections from dr. Amil Amen, her rheumatologist and also does exercises at home.   Past Medical History  Diagnosis Date  . Ulcerative colitis   . Hypothyroidism   . Hypertension     pcp   dr reed   [facility]  . Pneumonia 2008  . Chronic bronchitis 10/31/2011    "prone to it; I have sinus/bronchitis problem probably q yr"  . Multiple skin tears 10/31/2011    "get them very easily; my  skin is thin and I bruise easily"  . Chronic back pain     "my entire spine"  . Edema   . Other and unspecified hyperlipidemia   . Encounter for long-term (current) use of other medications   . Senile osteoporosis   . Spinal stenosis, unspecified region other than cervical   . Unspecified glaucoma   . Hypothyroidism   . Complication of anesthesia 2009    htn with block- for hand surgery   . Anxiety     sometimes   . Rheumatoid arthritis(714.0)     degenerative disc disease     Allergies: No Known Allergies   MEDICATIONS: . aspirin EC  325 mg Oral BID  . balsalazide  2,250 mg Oral BID  . celecoxib  200 mg Oral Q12H  . dexamethasone  10 mg Intravenous Once  . docusate sodium  100 mg Oral BID  . estradiol  0.5 mg Oral QHS  . ferrous sulfate  325 mg Oral TID PC  . levothyroxine  88 mcg Oral QAC breakfast  . medroxyPROGESTERone  2.5 mg Oral QHS  . oxyCODONE  5-15 mg Oral Q4H  . polyethylene glycol  17 g Oral BID  . predniSONE  5 mg Oral Q breakfast  . vancomycin  750 mg Intravenous Q12H    History  Substance Use Topics  . Smoking status: Passive Smoke  Exposure - Never Smoker -- 0.25 packs/day for 50 years    Types: Cigarettes  . Smokeless tobacco: Current User     Comment: 10/31/2011  offered smoking cessation materials; pt declines; "I puff; I've never really inhaled"  . Alcohol Use: 2.4 oz/week    2 Glasses of wine, 2 Cans of beer per week     Comment: 10/31/2011 "1 beers/wine with dinner daily ; an occasional scotch"    Family History  Problem Relation Age of Onset  . Heart disease Mother   . Cancer Father     lung     Review of Systems  Constitutional: Negative for fever, chills, diaphoresis, activity change, appetite change, fatigue and unexpected weight change.  HENT: Negative for congestion, sore throat, rhinorrhea, sneezing, trouble swallowing and sinus pressure.  Eyes: Negative for photophobia and visual disturbance.  Respiratory: Negative for cough,  chest tightness, shortness of breath, wheezing and stridor.  Cardiovascular: Negative for chest pain, palpitations and leg swelling.  Gastrointestinal: Negative for nausea, vomiting, abdominal pain, diarrhea, constipation, blood in stool, abdominal distention and anal bleeding.  Genitourinary: Negative for dysuria, hematuria, flank pain and difficulty urinating.  Musculoskeletal: left knee pain, right shoulder pain Skin: Negative for color change, pallor, rash and wound.  Neurological: Negative for dizziness, tremors, weakness and light-headedness.  Hematological: Negative for adenopathy. Does not bruise/bleed easily.  Psychiatric/Behavioral: Negative for behavioral problems, confusion, sleep disturbance, dysphoric mood, decreased concentration and agitation.     OBJECTIVE: Temp:  [98.1 F (36.7 C)-99 F (37.2 C)] 98.6 F (37 C) (08/03 1345) Pulse Rate:  [50-77] 69 (08/03 1345) Resp:  [11-18] 16 (08/03 1345) BP: (99-170)/(52-93) 145/58 mmHg (08/03 1345) SpO2:  [99 %-100 %] 99 % (08/03 1345) Physical Exam  Constitutional:  oriented to person, place, and time. appears well-developed and well-nourished. No distress.  HENT: [place]/AT, PERRLA, no scleral icterus Mouth/Throat: Oropharynx is clear and moist. No oropharyngeal exudate.  Cardiovascular: Normal rate, regular rhythm and normal heart sounds. Exam reveals no gallop and no friction rub.  No murmur heard.  Pulmonary/Chest: Effort normal and breath sounds normal. No respiratory distress.  has no wheezes.  Neck = supple, no nuchal rigidity Abdominal: Soft. Bowel sounds are normal.  exhibits no distension. There is no tenderness.  Lymphadenopathy: no cervical adenopathy. No axillary adenopathy Neurological: alert and oriented to person, place, and time.  Skin: Skin is warm and dry. Scattered echymosis Psychiatric: a normal mood and affect.  behavior is normal.  Ext: left leg wrapped, trace edema. Marked deviation of distal finger tips  associated with RA LABS: Results for orders placed or performed during the hospital encounter of 10/13/14 (from the past 48 hour(s))  CBC     Status: Abnormal   Collection Time: 10/13/14  2:30 PM  Result Value Ref Range   WBC 6.4 4.0 - 10.5 K/uL   RBC 3.42 (L) 3.87 - 5.11 MIL/uL   Hemoglobin 9.8 (L) 12.0 - 15.0 g/dL   HCT 30.5 (L) 36.0 - 46.0 %   MCV 89.2 78.0 - 100.0 fL   MCH 28.7 26.0 - 34.0 pg   MCHC 32.1 30.0 - 36.0 g/dL   RDW 16.0 (H) 11.5 - 15.5 %   Platelets 453 (H) 150 - 400 K/uL  Basic metabolic panel     Status: Abnormal   Collection Time: 10/13/14  2:30 PM  Result Value Ref Range   Sodium 139 135 - 145 mmol/L   Potassium 4.0 3.5 - 5.1 mmol/L   Chloride 110 101 -  111 mmol/L   CO2 24 22 - 32 mmol/L   Glucose, Bld 86 65 - 99 mg/dL   BUN 16 6 - 20 mg/dL   Creatinine, Ser 0.69 0.44 - 1.00 mg/dL   Calcium 7.6 (L) 8.9 - 10.3 mg/dL   GFR calc non Af Amer >60 >60 mL/min   GFR calc Af Amer >60 >60 mL/min    Comment: (NOTE) The eGFR has been calculated using the CKD EPI equation. This calculation has not been validated in all clinical situations. eGFR's persistently <60 mL/min signify possible Chronic Kidney Disease.    Anion gap 5 5 - 15  Protime-INR     Status: None   Collection Time: 10/13/14  2:30 PM  Result Value Ref Range   Prothrombin Time 14.4 11.6 - 15.2 seconds   INR 1.10 0.00 - 1.49  APTT     Status: None   Collection Time: 10/13/14  2:30 PM  Result Value Ref Range   aPTT 29 24 - 37 seconds  Type and screen     Status: None   Collection Time: 10/13/14  2:30 PM  Result Value Ref Range   ABO/RH(D) B POS    Antibody Screen NEG    Sample Expiration 10/16/2014   Urinalysis, Routine w reflex microscopic (not at [facility])     Status: Abnormal   Collection Time: 10/13/14  3:05 PM  Result Value Ref Range   Color, Urine YELLOW YELLOW   APPearance CLOUDY (A) CLEAR   Specific Gravity, Urine 1.016 1.005 - 1.030   pH 6.0 5.0 - 8.0   Glucose, UA NEGATIVE NEGATIVE  mg/dL   Hgb urine dipstick NEGATIVE NEGATIVE   Bilirubin Urine NEGATIVE NEGATIVE   Ketones, ur NEGATIVE NEGATIVE mg/dL   Protein, ur NEGATIVE NEGATIVE mg/dL   Urobilinogen, UA 0.2 0.0 - 1.0 mg/dL   Nitrite NEGATIVE NEGATIVE   Leukocytes, UA NEGATIVE NEGATIVE    Comment: MICROSCOPIC NOT DONE ON URINES WITH NEGATIVE PROTEIN, BLOOD, LEUKOCYTES, NITRITE, OR GLUCOSE <1000 mg/dL.  Anaerobic culture     Status: None (Preliminary result)   Collection Time: 10/13/14  5:08 PM  Result Value Ref Range   Specimen Description SYNOVIAL    Special Requests PATIENT ON FOLLOWING VANCOMYCIN IV    Gram Stain      FEW WBC PRESENT,BOTH PMN AND MONONUCLEAR NO ORGANISMS SEEN Performed at [facility]    Culture      NO ANAEROBES ISOLATED; CULTURE IN PROGRESS FOR 5 DAYS Performed at [facility]    Report Status PENDING   Anaerobic culture     Status: None (Preliminary result)   Collection Time: 10/13/14  5:08 PM  Result Value Ref Range   Specimen Description SYNOVIAL LEFT KNEE    Special Requests PATIENT ON FOLLOWING VANCOMYCIN IV    Gram Stain PENDING    Culture      NO ANAEROBES ISOLATED; CULTURE IN PROGRESS FOR 5 DAYS Performed at [facility]    Report Status PENDING   Synovial cell count + diff, w/ crystals     Status: Abnormal   Collection Time: 10/13/14  5:08 PM  Result Value Ref Range   Color, Synovial YELLOW (A) YELLOW   Appearance-Synovial TURBID (A) CLEAR   Crystals, Fluid NO CRYSTALS SEEN    WBC, Synovial UNABLE TO PERFORM COUNT DUE TO CLOT IN SPECIMEN 0 - 200 /cu mm   Neutrophil, Synovial 80 (H) 0 - 25 %   Lymphocytes-Synovial Fld 6 0 - 20 %  Monocyte-Macrophage-Synovial Fluid 14 (L) 50 - 90 %  Body fluid culture     Status: None (Preliminary result)   Collection Time: 10/13/14  5:08 PM  Result Value Ref Range   Specimen Description SYNOVIAL LEFT KNEE    Special Requests PATIENT ON FOLLOWING VANCOMYCIN IV SWAB SPECIMEN    Gram Stain      MODERATE WBC  PRESENT,BOTH PMN AND MONONUCLEAR NO ORGANISMS SEEN    Culture      NO GROWTH < 12 HOURS Performed at [facility]    Report Status PENDING   Body fluid culture     Status: None (Preliminary result)   Collection Time: 10/13/14  5:08 PM  Result Value Ref Range   Specimen Description SYNOVIAL LEFT KNEE    Special Requests PATIENT ON FOLLOWING VANCOMYCIN IV    Gram Stain      ABUNDANT WBC PRESENT, PREDOMINANTLY PMN NO ORGANISMS SEEN    Culture      NO GROWTH < 12 HOURS Performed at [facility]    Report Status PENDING   Culture, blood (routine x 2)     Status: None (Preliminary result)   Collection Time: 10/13/14 11:10 PM  Result Value Ref Range   Specimen Description BLOOD RIGHT HAND    Special Requests BOTTLES DRAWN AEROBIC ONLY 5ML    Culture PENDING    Report Status PENDING   CBC     Status: Abnormal   Collection Time: 10/14/14  4:14 AM  Result Value Ref Range   WBC 6.7 4.0 - 10.5 K/uL   RBC 3.19 (L) 3.87 - 5.11 MIL/uL   Hemoglobin 9.0 (L) 12.0 - 15.0 g/dL   HCT 28.7 (L) 36.0 - 46.0 %   MCV 90.0 78.0 - 100.0 fL   MCH 28.2 26.0 - 34.0 pg   MCHC 31.4 30.0 - 36.0 g/dL   RDW 16.2 (H) 11.5 - 15.5 %   Platelets 409 (H) 150 - 400 K/uL  Basic metabolic panel     Status: Abnormal   Collection Time: 10/14/14  4:14 AM  Result Value Ref Range   Sodium 139 135 - 145 mmol/L   Potassium 4.7 3.5 - 5.1 mmol/L   Chloride 108 101 - 111 mmol/L   CO2 24 22 - 32 mmol/L   Glucose, Bld 202 (H) 65 - 99 mg/dL   BUN 15 6 - 20 mg/dL   Creatinine, Ser 0.69 0.44 - 1.00 mg/dL   Calcium 7.9 (L) 8.9 - 10.3 mg/dL   GFR calc non Af Amer >60 >60 mL/min   GFR calc Af Amer >60 >60 mL/min    Comment: (NOTE) The eGFR has been calculated using the CKD EPI equation. This calculation has not been validated in all clinical situations. eGFR's persistently <60 mL/min signify possible Chronic Kidney Disease.    Anion gap 7 5 - 15    MICRO: 8/2 blood cx 8/2 synovial fluid  cx  Assessment/Plan:  80yo f with hx of UC and RA on biologics who had recent left TKa in April 2229, now complicated by early pji associated with recurrent cellulitis  - recommend to continue with vanco x 6 wk - vanco trough 15-20. Will need weekly cbc, bmp and vanco trough. vanco dosing per home health protocol - will check sed rate and crp - will check hep C ab as part of health promotion - re: UC and RA, recommend to hold further humara dosing til after completion of IV antibiotics - pill esophagitis = new  problem from recent clindamycin. Can do a trial of nystatin swish and swallow to see if any relief vs. Having her undergo EGD evaluation   Ronin Rehfeldt B. [facility] [phone]

## 2014-10-14 NOTE — Discharge Instructions (Signed)

## 2014-10-14 NOTE — Op Note (Signed)
NAMEJESSIKAH, DICKER NO.:  [id number]  MEDICAL RECORD NO.:  [id number]  LOCATION:  4580                         FACILITY:  [facility]  PHYSICIAN:  Pietro Cassis. Alvan Dame, M.D.  DATE OF BIRTH:  1934-09-23  DATE OF PROCEDURE:  10/13/2014 DATE OF DISCHARGE:                              OPERATIVE REPORT   PREOPERATIVE DIAGNOSIS:  Infected left total knee replacement.  POSTOPERATIVE DIAGNOSIS:  Infected left total knee replacement.  PROCEDURES: 1. Open excisional debridement, scar debridement, synovectomy of knee     including skin, subcutaneous tissue, and intra-articular scar and     synovium. 2. Nonexcisional debridement of left knee with 6 L of normal saline     solution plus a combination of 750 mL of normal saline mixed with     chlorhexidine soap intra-articularly. 3. Polyethylene exchanged with a size 3 x 12.5 mm posterior stabilized     insert.  SURGEON:  Pietro Cassis. Alvan Dame, M.D.  ASSISTANT:  Danae Orleans, PA-C.  Note that Mr. [name] was present for the entirety of the case from preoperative positioning, perioperative management of the operative extremity, general facilitation of the case, and primary wound closure.  ANESTHESIA:  General.  DRAINS:  None.  TOURNIQUET TIME:  Per anesthetic record, 20+ minutes at 250 mmHg.  SPECIMEN:  Syringe filled with the joint fluid appeared to be inflamed, for gram stain, culture, cell count as well as swabs of the soft tissues.  INDICATION FOR PROCEDURE:  Ms. Barba is a very pleasant 80 year old female, unfortunately with a longstanding history of rheumatoid arthritis, on immunosuppressive medication.  In addition to having this, she has gastrointestinal problems related to immunosuppression condition.  Four months ago, she had a left total knee arthroplasty and progressed well other than development of significant cellulitis of the lower extremity and weeping wounds.  She [facility] for [facility]  management, hopefully get these wounds to heal.  However, she had progressive knee pain.  She had been seen and evaluated in the office recently and had knee aspiration which revealed 24,000 white cells.  I discussed with her the concern for infection versus inflammatory arthropathy and the inability to discern the difference. She had been on the antibiotics, cultures were difficult to rely on. Based on her history, based on her medication use, based on the findings by inspiration, I decided to take her to the operating room for I and D and poly exchange.  I discussed with her the rationale and the goals. Consent was obtained for benefit to improve the joint pain as well as joint preservation long-term.  Long-term plans including suppressive antibiotics for 6 months were discussed.  PROCEDURE IN DETAIL:  The patient was brought to the operative theater. Once adequate level of anesthesia and preoperative antibiotics which include vancomycin, she was positioned supine with a left thigh tourniquet placed.  The left lower extremity was then prepped and draped in sterile fashion avoiding the lower leg lesions which had not yet healed.  A time-out was performed identifying the patient, planned procedure, and extremity.  Leg was exsanguinated, tourniquet elevated to 250 mmHg.  A midline incision was made through her old incision.  Soft tissue planes were created.  I excised the old scar.  Median arthrotomy was made. Again, there was some inflamed synovial fluid not with a normal appearance.  Aspiration of this fluid was sent to Pathology for cell count, gram stain, and culture.  This is already available from aspiration performed in the office.  Following the obtaining of cultures, the excisional debridement was carried out sharply using Bovie and knife of the synovium medially and laterally and suprapatellar pouch removing all potential inflamed and involved the synovium. I then focused on  the peripatellar area debriding and excising the scar and synovium as well.  At this point, the knee was flexed.  The old polyethylene insert was removed using an osteotome.  This allowed further debridement of the posterior aspect of the knee.  Following this, I irrigated the knee with 3 L of normal saline solution with pulse lavage.  Following this, we combined chlorhexidine soap with 750 mL of normal saline solution and then irrigated the knee.  This was kept in the wound for 5 minutes.  I then re-irrigated the knee with 3 L of normal saline solution following the saponification of the wound with chlorhexidine.  The final 3 x 12.5 posterior stabilized insert was then opened and then inserted.  At this point, we reapproximated the extensor mechanism with the knee in flexion using #1 PDS.  The remainder was closed with 2-0 Vicryl and running 4-0 Monocryl.  She was brought to the recovery room in stable condition tolerating the procedure well.  Postoperatively, we will have Infectious Disease follow with the goal of cure her joint infection without resection taking in to consideration the loss of dexterity particularly of her hands as it relates to her rheumatoid arthritis.  This will pose to be a fairly challenging interval at least for the first 6 weeks with IV antibiotics.  We will arrange that in the hospital.  Also, have Wound Care consult for either Wednesday or Thursday.  She was supposed to have a dressing change scheduled one of those days.     Pietro Cassis Alvan Dame, M.D.     MDO/MEDQ  D:  10/14/2014  T:  10/14/2014  Job:  [id number]

## 2014-10-14 NOTE — Progress Notes (Signed)
Physical Therapy Treatment Patient Details Name: COLBI SCHILTZ MRN: [id number] DOB: May 11, 1934 Today's Date: 10-19-14    History of Present Illness Pt admitted for INCISION AND DRAINAGE LEFT TOTAL KNEE WITH POLY EXCHANGE. Pt with PMH of RA, back surgery, L TKA 06/2014 and recent admission on 09/25/2014 for L LE cellultis.     PT Comments    Pt motivated and progressing well with mobility.  Follow Up Recommendations  Home health PT;SNF     Equipment Recommendations  None recommended by PT    Recommendations for Other Services OT consult     Precautions / Restrictions Precautions Precautions: Knee;Fall Restrictions Weight Bearing Restrictions: No Other Position/Activity Restrictions: WBAT    Mobility  Bed Mobility Overal bed mobility: Needs Assistance Bed Mobility: Supine to Sit;Sit to Supine     Supine to sit: Min guard Sit to supine: Min guard   General bed mobility comments: Min cues for sequence  Transfers Overall transfer level: Needs assistance Equipment used: Rolling walker (2 wheeled) Transfers: Sit to/from Stand Sit to Stand: Min guard         General transfer comment: cues for hand placement.  Ambulation/Gait Ambulation/Gait assistance: Min guard Ambulation Distance (Feet): 150 Feet (twice) Assistive device: Rolling walker (2 wheeled) Gait Pattern/deviations: Step-through pattern;Shuffle;Trunk flexed;Wide base of support     General Gait Details: min cues for posture, position from RW and initial seqence   Stairs            Wheelchair Mobility    Modified Rankin (Stroke Patients Only)       Balance                                    Cognition Arousal/Alertness: Awake/alert Behavior During Therapy: WFL for tasks assessed/performed Overall Cognitive Status: Within Functional Limits for tasks assessed                      Exercises      General Comments        Pertinent Vitals/Pain Pain  Assessment: 0-10 Pain Score: 5  Pain Location: L LE Pain Descriptors / Indicators: Aching;Discomfort Pain Intervention(s): Limited activity within patient's tolerance;Monitored during session;Ice applied    Home Living                      Prior Function            PT Goals (current goals can now be found in the care plan section) Acute Rehab PT Goals Patient Stated Goal: increase independence PT Goal Formulation: With patient Time For Goal Achievement: 10/03/14 Potential to Achieve Goals: Good    Frequency  7X/week    PT Plan Current plan remains appropriate    Co-evaluation             End of Session Equipment Utilized During Treatment: Gait belt Activity Tolerance: Patient tolerated treatment well Patient left: in bed;with call bell/phone within reach     Time: 4967-5916 PT Time Calculation (min) (ACUTE ONLY): 17 min  Charges:  $Gait Training: 8-22 mins                    G Codes:      Gladis Soley 2014/10/19, 5:13 PM

## 2014-10-14 NOTE — Brief Op Note (Signed)
10/13/2014  7:48 AM  PATIENT:  [name]  80 y.o. female  PRE-OPERATIVE DIAGNOSIS:  INFECTED LEFT TOTAL KNEE REPLACEMENT   POST-OPERATIVE DIAGNOSIS:  INFECTED LEFT TOTAL KNEE REPLACEMENT   PROCEDURE:  Procedure(s): INCISION AND DRAINAGE LEFT TOTAL KNEE WITH POLY EXCHANGE (Left)  SURGEON:  Surgeon(s) and Role:    * Paralee Cancel, MD - Primary  PHYSICIAN ASSISTANT: Danae Orleans, PA-C  ANESTHESIA:   general  EBL:  <100cc  BLOOD ADMINISTERED:none  DRAINS: none   LOCAL MEDICATIONS USED:  NONE  SPECIMEN:  Source of Specimen:  left knee fluid and culture swabs  DISPOSITION OF SPECIMEN:  PATHOLOGY  COUNTS:  YES  TOURNIQUET:   Total Tourniquet Time Documented: Thigh (Left) - 57 minutes Total: Thigh (Left) - 57 minutes   DICTATION: .Other Dictation: Dictation Number [id number]  PLAN OF CARE: Admit to inpatient   PATIENT DISPOSITION:  PACU - hemodynamically stable.   Delay start of Pharmacological VTE agent (>24hrs) due to surgical blood loss or risk of bleeding: no

## 2014-10-15 LAB — CBC
HEMATOCRIT: 24.6 % — AB (ref 36.0–46.0)
Hemoglobin: 7.9 g/dL — ABNORMAL LOW (ref 12.0–15.0)
MCH: 29 pg (ref 26.0–34.0)
MCHC: 32.1 g/dL (ref 30.0–36.0)
MCV: 90.4 fL (ref 78.0–100.0)
Platelets: 337 10*3/uL (ref 150–400)
RBC: 2.72 MIL/uL — ABNORMAL LOW (ref 3.87–5.11)
RDW: 16.3 % — AB (ref 11.5–15.5)
WBC: 9.4 10*3/uL (ref 4.0–10.5)

## 2014-10-15 LAB — BASIC METABOLIC PANEL
ANION GAP: 4 — AB (ref 5–15)
BUN: 22 mg/dL — ABNORMAL HIGH (ref 6–20)
CO2: 25 mmol/L (ref 22–32)
Calcium: 7.3 mg/dL — ABNORMAL LOW (ref 8.9–10.3)
Chloride: 109 mmol/L (ref 101–111)
Creatinine, Ser: 0.9 mg/dL (ref 0.44–1.00)
GFR calc non Af Amer: >60 mL/min (ref >60)
GLUCOSE: 126 mg/dL — AB (ref 65–99)
Potassium: 4.6 mmol/L (ref 3.5–5.1)
Sodium: 138 mmol/L (ref 135–145)

## 2014-10-15 MED ORDER — ACETAMINOPHEN 325 MG PO TABS: 650.0000 mg | ORAL_TABLET | Freq: Four times a day (QID) | ORAL | Status: DC | PRN

## 2014-10-15 MED ORDER — OXYCODONE HCL 5 MG PO TABS: 5.0000 mg | ORAL_TABLET | ORAL | Status: DC | PRN

## 2014-10-15 MED ORDER — DOCUSATE SODIUM 100 MG PO CAPS: 100.0000 mg | ORAL_CAPSULE | Freq: Two times a day (BID) | ORAL | Status: DC

## 2014-10-15 MED ORDER — DIAZEPAM 5 MG PO TABS: 5.0000 mg | ORAL_TABLET | Freq: Four times a day (QID) | ORAL | Status: DC | PRN

## 2014-10-15 MED ORDER — TIZANIDINE HCL 4 MG PO TABS: 4.0000 mg | ORAL_TABLET | Freq: Four times a day (QID) | ORAL | Status: DC | PRN

## 2014-10-15 MED ORDER — HEPARIN SOD (PORK) LOCK FLUSH 100 UNIT/ML IV SOLN
250.0000 [IU] | INTRAVENOUS | Status: AC | PRN
  Administered 2014-10-15: 250 [IU]

## 2014-10-15 MED ORDER — VANCOMYCIN HCL IN DEXTROSE 750-5 MG/150ML-% IV SOLN: 750.0000 mg | Freq: Two times a day (BID) | INTRAVENOUS | Status: DC

## 2014-10-15 MED ORDER — ASPIRIN 325 MG PO TBEC: 325.0000 mg | DELAYED_RELEASE_TABLET | Freq: Two times a day (BID) | ORAL | Status: DC

## 2014-10-15 MED ORDER — POLYETHYLENE GLYCOL 3350 17 G PO PACK: 17.0000 g | PACK | Freq: Two times a day (BID) | ORAL | Status: DC

## 2014-10-15 MED ORDER — FERROUS SULFATE 325 (65 FE) MG PO TABS: 325.0000 mg | ORAL_TABLET | Freq: Three times a day (TID) | ORAL | Status: DC

## 2014-10-15 NOTE — Clinical Social Work Placement (Signed)
   CLINICAL SOCIAL WORK PLACEMENT  NOTE  Date:  10/15/2014  Patient Details  Name: [name] MRN: [id number] Date of Birth: 1934-07-15  Clinical Social Work is seeking post-discharge placement for this patient at the [place] level of care (*CSW will initial, date and re-position this form in  chart as items are completed):  No   Patient/family provided with [facility] Work Department's list of facilities offering this level of care within the geographic area requested by the patient (or if unable, by the patient's family).  Yes   Patient/family informed of their freedom to choose among providers that offer the needed level of care, that participate in Medicare, Medicaid or managed care program needed by the patient, have an available bed and are willing to accept the patient.  No   Patient/family informed of [facility]'s ownership interest in [facility] and [facility], as well as of the fact that they are under no obligation to receive care at these facilities.  PASRR submitted to EDS on       PASRR number received on       Existing PASRR number confirmed on 10/14/14     FL2 transmitted to all facilities in geographic area requested by pt/family on 10/14/14     FL2 transmitted to all facilities within larger geographic area on       Patient informed that his/her managed care company has contracts with or will negotiate with certain facilities, including the following:        Yes   Patient/family informed of bed offers received.  Patient chooses bed at [facility] [place]     Physician recommends and patient chooses bed at      Patient to be transferred to [facility] on 10/15/14.  Patient to be transferred to facility by [facility]     Patient family notified on 10/15/14 of transfer.  Name of family member notified:  SPOUSE     PHYSICIAN       Additional Comment: Pt / spouse are in agreement with d/c to [facility] ( [facility] today. PT has approved transport by car. NSG reviewed dc summary,scripts,avs. Scripts included in dc packet. DC summary sent to SNF for review prior to dc. DC packet provided to pt prior to dc.     _______________________________________________ [name], LCSW  [phone] 10/15/2014, 3:13 PM

## 2014-10-15 NOTE — Discharge Summary (Signed)
Physician Discharge Summary  Patient ID: [name] MRN: [id number] DOB/AGE: 1934-12-20 80 y.o.  Admit date: 10/13/2014 Discharge date:  10/15/2014  Procedures:  Procedure(s) (LRB): INCISION AND DRAINAGE LEFT TOTAL KNEE WITH POLY EXCHANGE (Left)  Attending Physician:  Dr. Paralee Cancel   Admission Diagnoses:   Infected left total knee arthroplasty   Discharge Diagnoses:  Principal Problem:   Infection of prosthetic left knee joint Active Problems:   Infection of total knee replacement  Past Medical History  Diagnosis Date  . Ulcerative colitis   . Hypothyroidism   . Hypertension     pcp   dr reed   [facility]  . Pneumonia 2008  . Chronic bronchitis 10/31/2011    "prone to it; I have sinus/bronchitis problem probably q yr"  . Multiple skin tears 10/31/2011    "get them very easily; my skin is thin and I bruise easily"  . Chronic back pain     "my entire spine"  . Edema   . Other and unspecified hyperlipidemia   . Encounter for long-term (current) use of other medications   . Senile osteoporosis   . Spinal stenosis, unspecified region other than cervical   . Unspecified glaucoma   . Hypothyroidism   . Complication of anesthesia 2009    htn with block- for hand surgery   . Anxiety     sometimes   . Rheumatoid arthritis(714.0)     degenerative disc disease     HPI:    Pt is a 80 y.o. female complaining of left knee pain and swelling. Patient had a left total knee on 06/16/2014 per Dr. Alvan Dame. She has had a complicated course with severe RA and UC and is immunocompromised because of the medications needed. She has cellulitis from a leg wound which is most likely the cause of the infected left total knee arthroplasty. Do to swelling in the office the knee was aspirated and the labs revealed 24,000 WBC. Various options are discussed with the patient. Dr. Alvan Dame feels that I&D and poly exchange with 6 weeks of IV antibiotics and then using oral antibiotic would  benefit her compared to further surgery at this time. Do to the advanced state of her RA and UC and the medications she needs to take, a more conservative route is discussed with the patient. Risks, benefits and expectations were discussed with the patient. Patient understand the risks, benefits and expectations and wishes to proceed with surgery.   PCP: Hollace Kinnier, DO   Discharged Condition: good  Hospital Course:  Patient underwent the above stated procedure on 10/13/2014. Patient tolerated the procedure well and brought to the recovery room in good condition and subsequently to the floor.  POD #1 BP: 104/62 ; Pulse: 64 ; Temp: 98.2 F (36.8 C) ; Resp: 16 Patient reports pain as mild, pain controlled. No events throughout the night. Discussed the PICC line and antibiotics. Discussed with case worker regarding trying to set up arrangements with her infusions. She has significant RA deformities and knows she wouldn't be able to do the infusions herself. Dorsiflexion/plantar flexion intact, incision: dressing C/D/I and compartment soft.   LABS  Basename    HGB  9.0  HCT  28.7   POD #2  BP: 119/53 ; Pulse: 66 ; Temp: 98.6 F (37 C) ; Resp: 16 Patient reports pain as mild, pain controlled. No events throughout the night. Was seen by Dr. Baxter Flattery who made suggestions regarding antibiotics. Feels much better today after being  able to get a good amount of sleep last night.  Dorsiflexion/plantar flexion intact, incision: dressing C/D/I and compartment soft.   LABS  Basename    HGB  7.9  HCT  24.6    Discharge Exam: General appearance: alert, cooperative and no distress Extremities: Homans sign is negative, no sign of DVT, no edema, redness or tenderness in the calves or thighs and no ulcers, gangrene or trophic changes  Disposition:     Skilled nursing facility with follow up in 2 weeks   Follow-up Information    Follow up with Mauri Pole, MD. Schedule an appointment as soon as  possible for a visit in 2 weeks.   Specialty:  Orthopedic Surgery   Contact information:   [address] [place] [address] [phone]       Schedule an appointment as soon as possible for a visit with Carlyle Basques, MD.   Specialty:  Infectious Diseases   Contact information:   [place] [address] [phone]           Discharge Instructions    Call MD / Call 911    Complete by:  As directed   If you experience chest pain or shortness of breath, CALL 911 and be transported to the hospital emergency room.  If you develope a fever above 101 F, pus (white drainage) or increased drainage or redness at the wound, or calf pain, call your surgeon's office.     Change dressing    Complete by:  As directed   Maintain surgical dressing until follow up in the clinic. If the edges start to pull up, may reinforce with tape. If the dressing is no longer working, may remove and cover with gauze and tape, but must keep the area dry and clean.  Call with any questions or concerns.     Constipation Prevention    Complete by:  As directed   Drink plenty of fluids.  Prune juice may be helpful.  You may use a stool softener, such as Colace (over the counter) 100 mg twice a day.  Use MiraLax (over the counter) for constipation as needed.     Diet - low sodium heart healthy    Complete by:  As directed      Discharge instructions    Complete by:  As directed   Maintain surgical dressing until follow up in the clinic. If the edges start to pull up, may reinforce with tape. If the dressing is no longer working, may remove and cover with gauze and tape, but must keep the area dry and clean.  Follow up in 2 weeks at [facility]. Call with any questions or concerns.     Increase activity slowly as tolerated    Complete by:  As directed   Weight bearing as tolerated with assist device (walker, cane, etc) as directed, use it as long as  suggested by your surgeon or therapist, typically at least 4-6 weeks.     TED hose    Complete by:  As directed   Use stockings (TED hose) for 2 weeks on both leg(s).  You may remove them at night for sleeping.             Medication List    STOP taking these medications        cephALEXin 500 MG capsule  Commonly known as:  KEFLEX     furosemide 20 MG tablet  Commonly known as:  LASIX     HYDROcodone-acetaminophen 5-325 MG per tablet  Commonly known as:  NORCO/VICODIN     leflunomide 20 MG tablet  Commonly known as:  ARAVA     loperamide 2 MG capsule  Commonly known as:  IMODIUM     oxyCODONE-acetaminophen 5-325 MG per tablet  Commonly known as:  PERCOCET/ROXICET      TAKE these medications        acetaminophen 325 MG tablet  Commonly known as:  TYLENOL  Take 2 tablets (650 mg total) by mouth every 6 (six) hours as needed for mild pain (or Fever >/= 101).     aspirin 325 MG EC tablet  Take 1 tablet (325 mg total) by mouth 2 (two) times daily.     balsalazide 750 MG capsule  Commonly known as:  COLAZAL  Take 2,250 mg by mouth 2 (two) times daily.     Calcium + D3 600-200 MG-UNIT Tabs  Take 1 tablet by mouth daily.     CELEBREX 200 MG capsule  Generic drug:  celecoxib  Take 200 mg by mouth 2 (two) times daily.     collagenase ointment  Commonly known as:  SANTYL  Apply topically daily.     [employer] DIGESTIVE PROBIOTIC 250 MG capsule  Generic drug:  saccharomyces boulardii  Take 250 mg by mouth 2 (two) times daily.     diazepam 5 MG tablet  Commonly known as:  VALIUM  Take 1 tablet (5 mg total) by mouth every 6 (six) hours as needed for muscle spasms.     docusate sodium 100 MG capsule  Commonly known as:  COLACE  Take 1 capsule (100 mg total) by mouth 2 (two) times daily.     estradiol 0.5 MG tablet  Commonly known as:  ESTRACE  Take 0.5 mg by mouth at bedtime.     ferrous sulfate 325 (65 FE) MG tablet  Take 1 tablet (325 mg total) by mouth 3 (three)  times daily after meals.     Fish Oil 1200 MG Caps  Take 1,200 mg by mouth daily.     folic acid 1 MG tablet  Commonly known as:  FOLVITE  Take 2 mg by mouth daily.     guaiFENesin 600 MG 12 hr tablet  Commonly known as:  MUCINEX  Take 1,200 mg by mouth daily as needed (sinus relief).     latanoprost 0.005 % ophthalmic solution  Commonly known as:  XALATAN  Place 1 drop into both eyes at bedtime.     medroxyPROGESTERone 2.5 MG tablet  Commonly known as:  PROVERA  Take 2.5 mg by mouth at bedtime.     multivitamin with minerals Tabs tablet  Take 1 tablet by mouth daily.     oxyCODONE 5 MG immediate release tablet  Commonly known as:  Oxy IR/ROXICODONE  Take 1-3 tablets (5-15 mg total) by mouth every 4 (four) hours as needed for severe pain.     polyethylene glycol packet  Commonly known as:  MIRALAX / GLYCOLAX  Take 17 g by mouth 2 (two) times daily.     Potassium Gluconate 595 MG Caps  Take 1 capsule by mouth daily as needed (with furosemide.).     predniSONE 5 MG tablet  Commonly known as:  DELTASONE  Take 5 mg by mouth daily with breakfast.     SYNTHROID 88 MCG tablet  Generic drug:  levothyroxine  Take 88 mcg by mouth daily before breakfast. DAW, DO NOT SUBSTITUTE WITH  GENERIC     tiZANidine 4 MG tablet  Commonly known as:  ZANAFLEX  Take 1 tablet (4 mg total) by mouth every 6 (six) hours as needed for muscle spasms.     Vancomycin 750 MG/150ML Soln  Commonly known as:  VANCOCIN  Inject 150 mLs (750 mg total) into the vein every 12 (twelve) hours.     Vitamin D3 1000 UNITS Caps  Take 1,000 Units by mouth daily.         Signed: [place]. Shaheed Schmuck   PA-C  10/15/2014, 10:43 AM

## 2014-10-15 NOTE — Progress Notes (Addendum)
     Subjective: 2 Days Post-Op Procedure(s) (LRB): INCISION AND DRAINAGE LEFT TOTAL KNEE WITH POLY EXCHANGE (Left)   Patient reports pain as mild, pain controlled. No events throughout the night. Was seen by Dr. Baxter Flattery who made suggestions regarding antibiotics.  Feels much better today after being able to get a good amount of sleep last night.   Objective:   VITALS:   Filed Vitals:   10/15/14 0444  BP: 119/53  Pulse: 66  Temp: 98.6 F (37 C)  Resp: 16    Dorsiflexion/Plantar flexion intact Incision: dressing C/D/I Compartment soft  LABS  Recent Labs  10/13/14 1430 10/14/14 0414 10/15/14 0620  HGB 9.8* 9.0* 7.9*  HCT 30.5* 28.7* 24.6*  WBC 6.4 6.7 9.4  PLT 453* 409* 337     Recent Labs  10/13/14 1430 10/14/14 0414 10/15/14 0620  NA 139 139 138  K 4.0 4.7 4.6  BUN 16 15 22*  CREATININE 0.69 0.69 0.90  GLUCOSE 86 202* 126*     Assessment/Plan: 2 Days Post-Op Procedure(s) (LRB): INCISION AND DRAINAGE LEFT TOTAL KNEE WITH POLY EXCHANGE (Left) Up with therapy Discharge to SNF  Follow up in 2 weeks at [facility]. Follow up with OLIN,Tya Haughey D in 2 weeks.  Contact information:  [facility] [address], [address] [place] Nathen Balaban   PAC  10/15/2014, 8:19 AM

## 2014-10-15 NOTE — Care Management Important Message (Signed)
Important Message  Patient Details  Name: [name] MRN: [id number] Date of Birth: 01/20/1935   Medicare Important Message Given:  Yes-second notification given    Camillo Flaming 10/15/2014, 2:12 [place] Message  Patient Details  Name: [name] MRN: [id number] Date of Birth: [birth year]   Medicare Important Message Given:  Yes-second notification given    Camillo Flaming 10/15/2014, 2:11 PM

## 2014-10-15 NOTE — Progress Notes (Signed)
Physical Therapy Treatment Patient Details Name: [name] MRN: [id number] DOB: 05/08/1934 Today's Date: 10-19-14    History of Present Illness Pt admitted for INCISION AND DRAINAGE LEFT TOTAL KNEE WITH POLY EXCHANGE. Pt with PMH of RA, back surgery, L TKA 06/2014 and recent admission on 09/25/2014 for L LE cellultis.     PT Comments    Progressing well with mobility.  Follow Up Recommendations  Home health PT;SNF     Equipment Recommendations  None recommended by PT    Recommendations for Other Services OT consult     Precautions / Restrictions Precautions Precautions: Knee;Fall Restrictions Weight Bearing Restrictions: No Other Position/Activity Restrictions: WBAT    Mobility  Bed Mobility Overal bed mobility: Needs Assistance Bed Mobility: Supine to Sit     Supine to sit: Min guard     General bed mobility comments: Min cues for sequence  Transfers Overall transfer level: Needs assistance Equipment used: Rolling walker (2 wheeled) Transfers: Sit to/from Stand Sit to Stand: Min guard         General transfer comment: cues for hand placement.  Ambulation/Gait Ambulation/Gait assistance: Min guard Ambulation Distance (Feet): 180 Feet (twice) Assistive device: Rolling walker (2 wheeled) Gait Pattern/deviations: Step-through pattern;Shuffle;Trunk flexed     General Gait Details: min cues for posture, position from RW and initial seqence   Stairs            Wheelchair Mobility    Modified Rankin (Stroke Patients Only)       Balance                                    Cognition Arousal/Alertness: Awake/alert Behavior During Therapy: WFL for tasks assessed/performed Overall Cognitive Status: Within Functional Limits for tasks assessed                      Exercises Total Joint Exercises Ankle Circles/Pumps: AROM;Both;15 reps;Supine Quad Sets: AROM;Both;Supine;20 reps Heel Slides: AAROM;Left;Supine;20  reps Straight Leg Raises: AAROM;AROM;Left;20 reps;Supine Goniometric ROM: AAROM at knee -5 - 95    General Comments        Pertinent Vitals/Pain Pain Assessment: 0-10 Pain Score: 4  Pain Location: L LE Pain Descriptors / Indicators: Aching;Sore Pain Intervention(s): Limited activity within patient's tolerance;Monitored during session;Ice applied;Premedicated before session    Home Living                      Prior Function            PT Goals (current goals can now be found in the care plan section) Acute Rehab PT Goals Patient Stated Goal: increase independence PT Goal Formulation: With patient Time For Goal Achievement: 10/03/14 Potential to Achieve Goals: Good Progress towards PT goals: Progressing toward goals    Frequency  7X/week    PT Plan Current plan remains appropriate    Co-evaluation             End of Session Equipment Utilized During Treatment: Gait belt Activity Tolerance: Patient tolerated treatment well Patient left: in chair;with call bell/phone within reach     Time: 0827-0903 PT Time Calculation (min) (ACUTE ONLY): 36 min  Charges:  $Gait Training: 8-22 mins $Therapeutic Exercise: 8-22 mins                    G Codes:      [name] 10-19-14, 12:07 PM

## 2014-10-16 LAB — BODY FLUID CULTURE
CULTURE: NO GROWTH
Culture: NO GROWTH

## 2014-10-17 LAB — ANAEROBIC CULTURE: Gram Stain: NONE SEEN

## 2014-10-19 LAB — CULTURE, BLOOD (ROUTINE X 2)
CULTURE: NO GROWTH
CULTURE: NO GROWTH

## 2014-10-21 DIAGNOSIS — L97821 Non-pressure chronic ulcer of other part of left lower leg limited to breakdown of skin: Secondary | ICD-10-CM | POA: Diagnosis present

## 2014-10-21 DIAGNOSIS — K5190 Ulcerative colitis, unspecified, without complications: Secondary | ICD-10-CM | POA: Diagnosis not present

## 2014-10-21 DIAGNOSIS — N179 Acute kidney failure, unspecified: Secondary | ICD-10-CM | POA: Diagnosis not present

## 2014-10-21 DIAGNOSIS — I129 Hypertensive chronic kidney disease with stage 1 through stage 4 chronic kidney disease, or unspecified chronic kidney disease: Secondary | ICD-10-CM | POA: Diagnosis not present

## 2014-10-21 DIAGNOSIS — R601 Generalized edema: Secondary | ICD-10-CM | POA: Diagnosis not present

## 2014-10-21 DIAGNOSIS — N189 Chronic kidney disease, unspecified: Secondary | ICD-10-CM | POA: Diagnosis not present

## 2014-10-21 DIAGNOSIS — D631 Anemia in chronic kidney disease: Secondary | ICD-10-CM | POA: Diagnosis not present

## 2014-10-21 DIAGNOSIS — H409 Unspecified glaucoma: Secondary | ICD-10-CM | POA: Diagnosis not present

## 2014-10-21 DIAGNOSIS — E039 Hypothyroidism, unspecified: Secondary | ICD-10-CM | POA: Diagnosis not present

## 2014-10-21 DIAGNOSIS — M069 Rheumatoid arthritis, unspecified: Secondary | ICD-10-CM | POA: Diagnosis not present

## 2014-10-21 DIAGNOSIS — M1990 Unspecified osteoarthritis, unspecified site: Secondary | ICD-10-CM | POA: Diagnosis not present

## 2014-10-21 DIAGNOSIS — L97811 Non-pressure chronic ulcer of other part of right lower leg limited to breakdown of skin: Secondary | ICD-10-CM | POA: Diagnosis not present

## 2014-10-21 DIAGNOSIS — I5032 Chronic diastolic (congestive) heart failure: Secondary | ICD-10-CM | POA: Diagnosis not present

## 2014-10-22 ENCOUNTER — Encounter (HOSPITAL_COMMUNITY): Payer: Self-pay | Admitting: Orthopedic Surgery

## 2014-10-28 DIAGNOSIS — L97821 Non-pressure chronic ulcer of other part of left lower leg limited to breakdown of skin: Secondary | ICD-10-CM | POA: Diagnosis not present

## 2014-10-29 ENCOUNTER — Encounter (HOSPITAL_COMMUNITY): Payer: Self-pay

## 2014-10-29 ENCOUNTER — Inpatient Hospital Stay (HOSPITAL_COMMUNITY)
Admission: AD | Admit: 2014-10-29 | Discharge: 2014-10-30 | DRG: 559 | Disposition: A | Discharge status: Home / Self Care | Payer: Medicare Other | Source: Ambulatory Visit | Attending: Internal Medicine | Admitting: Internal Medicine

## 2014-10-29 ENCOUNTER — Inpatient Hospital Stay (HOSPITAL_COMMUNITY): Payer: Medicare Other

## 2014-10-29 DIAGNOSIS — M810 Age-related osteoporosis without current pathological fracture: Secondary | ICD-10-CM | POA: Diagnosis present

## 2014-10-29 DIAGNOSIS — M069 Rheumatoid arthritis, unspecified: Secondary | ICD-10-CM | POA: Diagnosis present

## 2014-10-29 DIAGNOSIS — Z8701 Personal history of pneumonia (recurrent): Secondary | ICD-10-CM | POA: Diagnosis not present

## 2014-10-29 DIAGNOSIS — R609 Edema, unspecified: Secondary | ICD-10-CM | POA: Diagnosis not present

## 2014-10-29 DIAGNOSIS — Z79818 Long term (current) use of other agents affecting estrogen receptors and estrogen levels: Secondary | ICD-10-CM

## 2014-10-29 DIAGNOSIS — Z7982 Long term (current) use of aspirin: Secondary | ICD-10-CM

## 2014-10-29 DIAGNOSIS — L03116 Cellulitis of left lower limb: Secondary | ICD-10-CM | POA: Diagnosis present

## 2014-10-29 DIAGNOSIS — M549 Dorsalgia, unspecified: Secondary | ICD-10-CM | POA: Diagnosis present

## 2014-10-29 DIAGNOSIS — D638 Anemia in other chronic diseases classified elsewhere: Secondary | ICD-10-CM | POA: Diagnosis present

## 2014-10-29 DIAGNOSIS — N179 Acute kidney failure, unspecified: Secondary | ICD-10-CM | POA: Diagnosis not present

## 2014-10-29 DIAGNOSIS — E039 Hypothyroidism, unspecified: Secondary | ICD-10-CM | POA: Diagnosis present

## 2014-10-29 DIAGNOSIS — I10 Essential (primary) hypertension: Secondary | ICD-10-CM | POA: Diagnosis not present

## 2014-10-29 DIAGNOSIS — Z96652 Presence of left artificial knee joint: Secondary | ICD-10-CM | POA: Diagnosis present

## 2014-10-29 DIAGNOSIS — Z79891 Long term (current) use of opiate analgesic: Secondary | ICD-10-CM | POA: Diagnosis not present

## 2014-10-29 DIAGNOSIS — Z79899 Other long term (current) drug therapy: Secondary | ICD-10-CM | POA: Diagnosis not present

## 2014-10-29 DIAGNOSIS — L03119 Cellulitis of unspecified part of limb: Secondary | ICD-10-CM

## 2014-10-29 DIAGNOSIS — Y831 Surgical operation with implant of artificial internal device as the cause of abnormal reaction of the patient, or of later complication, without mention of misadventure at the time of the procedure: Secondary | ICD-10-CM | POA: Diagnosis present

## 2014-10-29 DIAGNOSIS — L97909 Non-pressure chronic ulcer of unspecified part of unspecified lower leg with unspecified severity: Secondary | ICD-10-CM | POA: Diagnosis present

## 2014-10-29 DIAGNOSIS — I83009 Varicose veins of unspecified lower extremity with ulcer of unspecified site: Secondary | ICD-10-CM | POA: Diagnosis present

## 2014-10-29 DIAGNOSIS — G8929 Other chronic pain: Secondary | ICD-10-CM | POA: Diagnosis present

## 2014-10-29 DIAGNOSIS — I5032 Chronic diastolic (congestive) heart failure: Secondary | ICD-10-CM | POA: Diagnosis not present

## 2014-10-29 DIAGNOSIS — I5033 Acute on chronic diastolic (congestive) heart failure: Secondary | ICD-10-CM | POA: Diagnosis present

## 2014-10-29 DIAGNOSIS — L03115 Cellulitis of right lower limb: Secondary | ICD-10-CM | POA: Diagnosis present

## 2014-10-29 DIAGNOSIS — T8454XA Infection and inflammatory reaction due to internal left knee prosthesis, initial encounter: Secondary | ICD-10-CM | POA: Diagnosis present

## 2014-10-29 DIAGNOSIS — D509 Iron deficiency anemia, unspecified: Secondary | ICD-10-CM

## 2014-10-29 DIAGNOSIS — M4802 Spinal stenosis, cervical region: Secondary | ICD-10-CM | POA: Diagnosis present

## 2014-10-29 DIAGNOSIS — Z7901 Long term (current) use of anticoagulants: Secondary | ICD-10-CM | POA: Diagnosis not present

## 2014-10-29 DIAGNOSIS — Z7952 Long term (current) use of systemic steroids: Secondary | ICD-10-CM

## 2014-10-29 DIAGNOSIS — K5190 Ulcerative colitis, unspecified, without complications: Secondary | ICD-10-CM | POA: Diagnosis present

## 2014-10-29 DIAGNOSIS — E785 Hyperlipidemia, unspecified: Secondary | ICD-10-CM | POA: Diagnosis present

## 2014-10-29 DIAGNOSIS — T8450XA Infection and inflammatory reaction due to unspecified internal joint prosthesis, initial encounter: Secondary | ICD-10-CM

## 2014-10-29 DIAGNOSIS — I509 Heart failure, unspecified: Secondary | ICD-10-CM

## 2014-10-29 DIAGNOSIS — M7989 Other specified soft tissue disorders: Secondary | ICD-10-CM | POA: Diagnosis present

## 2014-10-29 DIAGNOSIS — H409 Unspecified glaucoma: Secondary | ICD-10-CM | POA: Diagnosis present

## 2014-10-29 DIAGNOSIS — F17210 Nicotine dependence, cigarettes, uncomplicated: Secondary | ICD-10-CM | POA: Diagnosis present

## 2014-10-29 DIAGNOSIS — L0390 Cellulitis, unspecified: Secondary | ICD-10-CM | POA: Diagnosis present

## 2014-10-29 LAB — CBC WITH DIFFERENTIAL/PLATELET
Basophils Absolute: 0 10*3/uL (ref 0.0–0.1)
Basophils Relative: 0 % (ref 0–1)
Eosinophils Absolute: 0 10*3/uL (ref 0.0–0.7)
Eosinophils Relative: 1 % (ref 0–5)
HCT: 28 % — ABNORMAL LOW (ref 36.0–46.0)
HEMOGLOBIN: 8.6 g/dL — AB (ref 12.0–15.0)
LYMPHS ABS: 1.3 10*3/uL (ref 0.7–4.0)
Lymphocytes Relative: 15 % (ref 12–46)
MCH: 28.3 pg (ref 26.0–34.0)
MCHC: 30.7 g/dL (ref 30.0–36.0)
MCV: 92.1 fL (ref 78.0–100.0)
Monocytes Absolute: 0.6 10*3/uL (ref 0.1–1.0)
Monocytes Relative: 7 % (ref 3–12)
NEUTROS ABS: 6.8 10*3/uL (ref 1.7–7.7)
Neutrophils Relative %: 77 % (ref 43–77)
Platelets: 284 10*3/uL (ref 150–400)
RBC: 3.04 MIL/uL — AB (ref 3.87–5.11)
RDW: 19.3 % — ABNORMAL HIGH (ref 11.5–15.5)
WBC: 8.8 10*3/uL (ref 4.0–10.5)

## 2014-10-29 LAB — COMPREHENSIVE METABOLIC PANEL
ALT: 11 U/L — AB (ref 14–54)
AST: 23 U/L (ref 15–41)
Albumin: 3 g/dL — ABNORMAL LOW (ref 3.5–5.0)
Alkaline Phosphatase: 131 U/L — ABNORMAL HIGH (ref 38–126)
Anion gap: 5 (ref 5–15)
BILIRUBIN TOTAL: 0.7 mg/dL (ref 0.3–1.2)
BUN: 19 mg/dL (ref 6–20)
CALCIUM: 8.5 mg/dL — AB (ref 8.9–10.3)
CO2: 30 mmol/L (ref 22–32)
CREATININE: 1.14 mg/dL — AB (ref 0.44–1.00)
Chloride: 105 mmol/L (ref 101–111)
GFR, EST AFRICAN AMERICAN: 52 mL/min — AB (ref >60)
GFR, EST NON AFRICAN AMERICAN: 45 mL/min — AB (ref >60)
Glucose, Bld: 93 mg/dL (ref 65–99)
Potassium: 3.7 mmol/L (ref 3.5–5.1)
Sodium: 140 mmol/L (ref 135–145)
TOTAL PROTEIN: 5.6 g/dL — AB (ref 6.5–8.1)

## 2014-10-29 LAB — APTT: APTT: 34 s (ref 24–37)

## 2014-10-29 LAB — PROTIME-INR
INR: 2.74 — ABNORMAL HIGH (ref 0.00–1.49)
Prothrombin Time: 28.6 seconds — ABNORMAL HIGH (ref 11.6–15.2)

## 2014-10-29 LAB — TSH: TSH: 0.851 u[IU]/mL (ref 0.350–4.500)

## 2014-10-29 LAB — BRAIN NATRIURETIC PEPTIDE: B NATRIURETIC PEPTIDE 5: 853.9 pg/mL — AB (ref 0.0–100.0)

## 2014-10-29 LAB — MAGNESIUM: MAGNESIUM: 2.3 mg/dL (ref 1.7–2.4)

## 2014-10-29 LAB — PHOSPHORUS: PHOSPHORUS: 3.7 mg/dL (ref 2.5–4.6)

## 2014-10-29 MED ORDER — BALSALAZIDE DISODIUM 750 MG PO CAPS
2250.0000 mg | ORAL_CAPSULE | Freq: Two times a day (BID) | ORAL | Status: DC
  Administered 2014-10-29 – 2014-10-30 (×2): 2250 mg via ORAL
  Filled 2014-10-29 (×2): qty 3

## 2014-10-29 MED ORDER — WARFARIN - PHARMACIST DOSING INPATIENT: Freq: Every day | Status: DC

## 2014-10-29 MED ORDER — CALCIUM CARBONATE-VITAMIN D 500-200 MG-UNIT PO TABS
1.0000 | ORAL_TABLET | Freq: Every day | ORAL | Status: DC
  Administered 2014-10-30: 1 via ORAL
  Filled 2014-10-29: qty 1

## 2014-10-29 MED ORDER — MEDROXYPROGESTERONE ACETATE 2.5 MG PO TABS
2.5000 mg | ORAL_TABLET | Freq: Every day | ORAL | Status: DC
  Administered 2014-10-29: 2.5 mg via ORAL
  Filled 2014-10-29: qty 1

## 2014-10-29 MED ORDER — SODIUM CHLORIDE 0.9 % IJ SOLN: 3.0000 mL | Freq: Two times a day (BID) | INTRAMUSCULAR | Status: DC

## 2014-10-29 MED ORDER — SODIUM CHLORIDE 0.9 % IJ SOLN
10.0000 mL | INTRAMUSCULAR | Status: DC | PRN
  Administered 2014-10-30: 10 mL
  Filled 2014-10-29: qty 40

## 2014-10-29 MED ORDER — COLLAGENASE 250 UNIT/GM EX OINT: 1.0000 "application " | TOPICAL_OINTMENT | Freq: Every day | CUTANEOUS | Status: DC | PRN

## 2014-10-29 MED ORDER — METOPROLOL SUCCINATE ER 50 MG PO TB24
50.0000 mg | ORAL_TABLET | Freq: Two times a day (BID) | ORAL | Status: DC
  Administered 2014-10-29 – 2014-10-30 (×2): 50 mg via ORAL
  Filled 2014-10-29 (×2): qty 1

## 2014-10-29 MED ORDER — ACETAMINOPHEN 325 MG PO TABS: 650.0000 mg | ORAL_TABLET | Freq: Four times a day (QID) | ORAL | Status: DC | PRN

## 2014-10-29 MED ORDER — ADULT MULTIVITAMIN W/MINERALS CH
1.0000 | ORAL_TABLET | Freq: Every day | ORAL | Status: DC
  Administered 2014-10-30: 1 via ORAL
  Filled 2014-10-29: qty 1

## 2014-10-29 MED ORDER — POLYETHYLENE GLYCOL 3350 17 G PO PACK
17.0000 g | PACK | Freq: Two times a day (BID) | ORAL | Status: DC
  Administered 2014-10-29: 17 g via ORAL
  Filled 2014-10-29 (×2): qty 1

## 2014-10-29 MED ORDER — VANCOMYCIN HCL IN DEXTROSE 750-5 MG/150ML-% IV SOLN
750.0000 mg | Freq: Two times a day (BID) | INTRAVENOUS | Status: DC
  Administered 2014-10-29 – 2014-10-30 (×2): 750 mg via INTRAVENOUS
  Filled 2014-10-29 (×2): qty 150

## 2014-10-29 MED ORDER — VITAMIN D3 25 MCG (1000 UNIT) PO TABS
1000.0000 [IU] | ORAL_TABLET | Freq: Every day | ORAL | Status: DC
  Administered 2014-10-30: 1000 [IU] via ORAL
  Filled 2014-10-29: qty 1

## 2014-10-29 MED ORDER — DOCUSATE SODIUM 100 MG PO CAPS
100.0000 mg | ORAL_CAPSULE | Freq: Two times a day (BID) | ORAL | Status: DC
  Administered 2014-10-29: 100 mg via ORAL
  Filled 2014-10-29 (×2): qty 1

## 2014-10-29 MED ORDER — WARFARIN SODIUM 4 MG PO TABS: 4.0000 mg | ORAL_TABLET | ORAL | Status: DC

## 2014-10-29 MED ORDER — WARFARIN SODIUM 4 MG PO TABS
4.0000 mg | ORAL_TABLET | Freq: Once | ORAL | Status: AC
  Administered 2014-10-29: 4 mg via ORAL
  Filled 2014-10-29: qty 1

## 2014-10-29 MED ORDER — WARFARIN SODIUM 6 MG PO TABS: 6.0000 mg | ORAL_TABLET | ORAL | Status: DC

## 2014-10-29 MED ORDER — OXYCODONE HCL 5 MG PO TABS
5.0000 mg | ORAL_TABLET | ORAL | Status: DC | PRN
  Administered 2014-10-29: 10 mg via ORAL
  Administered 2014-10-30: 5 mg via ORAL
  Filled 2014-10-29: qty 1
  Filled 2014-10-29: qty 2

## 2014-10-29 MED ORDER — LATANOPROST 0.005 % OP SOLN
1.0000 [drp] | Freq: Every day | OPHTHALMIC | Status: DC
  Administered 2014-10-29: 1 [drp] via OPHTHALMIC
  Filled 2014-10-29: qty 2.5

## 2014-10-29 MED ORDER — CEFAZOLIN SODIUM 1-5 GM-% IV SOLN: 1.0000 g | Freq: Three times a day (TID) | INTRAVENOUS | Status: DC

## 2014-10-29 MED ORDER — LEVOTHYROXINE SODIUM 88 MCG PO TABS
88.0000 ug | ORAL_TABLET | Freq: Every day | ORAL | Status: DC
  Administered 2014-10-30: 88 ug via ORAL
  Filled 2014-10-29: qty 1

## 2014-10-29 MED ORDER — FUROSEMIDE 40 MG PO TABS
40.0000 mg | ORAL_TABLET | Freq: Two times a day (BID) | ORAL | Status: DC
  Administered 2014-10-29 – 2014-10-30 (×2): 40 mg via ORAL
  Filled 2014-10-29 (×2): qty 1

## 2014-10-29 MED ORDER — DIAZEPAM 5 MG PO TABS: 5.0000 mg | ORAL_TABLET | Freq: Four times a day (QID) | ORAL | Status: DC | PRN

## 2014-10-29 MED ORDER — PRO-STAT SUGAR FREE PO LIQD
30.0000 mL | Freq: Two times a day (BID) | ORAL | Status: DC
  Administered 2014-10-29 – 2014-10-30 (×2): 30 mL via ORAL
  Filled 2014-10-29 (×2): qty 30

## 2014-10-29 MED ORDER — SODIUM CHLORIDE 0.9 % IJ SOLN: 10.0000 mL | Freq: Two times a day (BID) | INTRAMUSCULAR | Status: DC

## 2014-10-29 MED ORDER — ONDANSETRON HCL 4 MG/2ML IJ SOLN: 4.0000 mg | Freq: Four times a day (QID) | INTRAMUSCULAR | Status: DC | PRN

## 2014-10-29 MED ORDER — PREDNISONE 5 MG PO TABS
5.0000 mg | ORAL_TABLET | Freq: Every day | ORAL | Status: DC
  Administered 2014-10-30: 5 mg via ORAL
  Filled 2014-10-29: qty 1

## 2014-10-29 MED ORDER — ASPIRIN EC 325 MG PO TBEC: 325.0000 mg | DELAYED_RELEASE_TABLET | Freq: Two times a day (BID) | ORAL | Status: DC

## 2014-10-29 MED ORDER — FOLIC ACID 1 MG PO TABS
2.0000 mg | ORAL_TABLET | Freq: Every day | ORAL | Status: DC
  Administered 2014-10-30: 2 mg via ORAL
  Filled 2014-10-29: qty 2

## 2014-10-29 MED ORDER — ONDANSETRON HCL 4 MG PO TABS: 4.0000 mg | ORAL_TABLET | Freq: Four times a day (QID) | ORAL | Status: DC | PRN

## 2014-10-29 MED ORDER — SIMETHICONE 80 MG PO CHEW
80.0000 mg | CHEWABLE_TABLET | Freq: Three times a day (TID) | ORAL | Status: DC | PRN
  Filled 2014-10-29: qty 1

## 2014-10-29 MED ORDER — POTASSIUM CHLORIDE CRYS ER 20 MEQ PO TBCR
20.0000 meq | EXTENDED_RELEASE_TABLET | Freq: Every day | ORAL | Status: DC
  Administered 2014-10-30: 20 meq via ORAL
  Filled 2014-10-29: qty 1

## 2014-10-29 MED ORDER — TIZANIDINE HCL 4 MG PO TABS
4.0000 mg | ORAL_TABLET | Freq: Four times a day (QID) | ORAL | Status: DC | PRN
  Filled 2014-10-29: qty 1

## 2014-10-29 MED ORDER — FERROUS SULFATE 325 (65 FE) MG PO TABS
325.0000 mg | ORAL_TABLET | Freq: Three times a day (TID) | ORAL | Status: DC
  Administered 2014-10-30 (×2): 325 mg via ORAL
  Filled 2014-10-29 (×3): qty 1

## 2014-10-29 MED ORDER — CALCIUM + D3 600-200 MG-UNIT PO TABS: 1.0000 | ORAL_TABLET | Freq: Every day | ORAL | Status: DC

## 2014-10-29 MED ORDER — SODIUM CHLORIDE 0.9 % IV SOLN
INTRAVENOUS | Status: DC
  Administered 2014-10-29: 19:00:00 via INTRAVENOUS

## 2014-10-29 MED ORDER — CELECOXIB 200 MG PO CAPS
200.0000 mg | ORAL_CAPSULE | Freq: Two times a day (BID) | ORAL | Status: DC
  Administered 2014-10-29 – 2014-10-30 (×2): 200 mg via ORAL
  Filled 2014-10-29 (×2): qty 1

## 2014-10-29 NOTE — Progress Notes (Signed)
[facility] admissions paged about patients arrival [place]. Brigitte Pulse, RN

## 2014-10-29 NOTE — Treatment Plan (Signed)
Discussed case with Dr. Alvan Dame  79yo with hx of RA s/p knee replacement 4 mos prior to admit with infected joint 2 weeks prior. Seen in Ortho clinic today and BLE noted to be markedly edematous, with UE edema and reported increased sob. Per Dr. Alvan Dame, knee does not look infected. Requests admission for concerns for possible ?CHF?  Accepted to med-tele floor

## 2014-10-29 NOTE — H&P (Addendum)
[facility] History and Physical  [name] OFB:[id number] DOB: September 01, 1934 DOA: 10/29/2014  Referring physician: Dr. Alvan Dame (orthopedic surgery) PCP: Hollace Kinnier, DO  Chief Complaint: leg swelling  HPI:  80 year old female with past medical history of hypertension, ulcerative colitis, rheumatoid arthritis, hypothyroidism, chronic diastolic CHF (last 2 D ECHO in 04/2014 showed preserver EF with grade 1 diastolic dysfunction), recent left total knee replacement and then hospitalization from 10/13/14 to 10/15/14 for infected left knee arthroplasty. She presented to orthopedic clinic today with swelling in lower extremities and shortness of breath. She was referred to [facility] for an admission from orthopedic office. Pt reported her swelling was evident for last 1 week or so but started to get progressively worse over day or so with more redness extending from down below the knee to her ankle. She reported some shortness of breath but no fevers or chills. No cough. No reports of abdominal pain, nausea or vomiting. No reports of blood in stool or urine. No urinary complaints.   Please note that there are no admission labs since the patient has been directly admitted to the telemetry floor.    Assessment & Plan    Principal Problem:   Cellulitis of lower extremities, bilaterally /  Infection of prosthetic left knee joint - Pt is already on vancomycin from previous admission.  - Continue vanco through 11/26/2014 - Obtain LE doppler to rule out DVT - Obtain CBC - Coumadin for VTE prophylaxis   Active Problems:   Acute on chronic diastolic CHF (congestive heart failure), NYHA class 1 - Last 2 D ECHO in 04/2014 showed preserved F with grade 1 diastolic dysfunction - Follow up BMP - Obtain CXR - She does not seem to have lot of shortness of breath  - For now continue lasix per home dose, 40 mg BID    Rheumatoid arthritis - Continue low dose prednisone    Hypothyroidism - Continue  synthroid - Check TSH    Benign essential HTN - Resume metoprolol      Anemia, iron deficiency - Continue ferrous sulfate supplementation     Ulcerative colitis, chronic - Stable - No abdominal pain, no diarrhea, no rectal bleed    DVT prophylaxis:  - On full dose anticoagulation with coumadin  Radiological Exams on Admission: No results found.   Code Status: Full Family Communication: Plan of care discussed with the patient  Disposition Plan: Admit for further evaluation  Leisa Lenz, MD  [facility] Pager [phone]  Time spent in minutes: 50 minutes  Review of Systems:  Constitutional: Negative for fever, chills and malaise/fatigue. Negative for diaphoresis.  HENT: Negative for hearing loss, ear pain, nosebleeds, congestion, sore throat, neck pain, tinnitus and ear discharge.   Eyes: Negative for blurred vision, double vision, photophobia, pain, discharge and redness.  Respiratory: Per HPI Cardiovascular: Negative for chest pain, palpitations, orthopnea, claudication and leg swelling.  Gastrointestinal: Negative for nausea, vomiting and abdominal pain. Negative for heartburn, constipation, blood in stool and melena.  Genitourinary: Negative for dysuria, urgency, frequency, hematuria and flank pain.  Musculoskeletal: Negative for myalgias, back pain, joint pain and falls.  Skin: Negative for itching and rash.  Neurological: Negative for dizziness and weakness. Negative for tingling, tremors, sensory change, speech change, focal weakness, loss of consciousness and headaches.  Endo/Heme/Allergies: Negative for environmental allergies and polydipsia. Does not bruise/bleed easily.  Psychiatric/Behavioral: Negative for suicidal ideas. The patient is not nervous/anxious.      Past Medical History  Diagnosis Date  .  Ulcerative colitis   . Hypothyroidism   . Hypertension     pcp   dr reed   [facility]  . Pneumonia 2008  . Chronic bronchitis 10/31/2011     "prone to it; I have sinus/bronchitis problem probably q yr"  . Multiple skin tears 10/31/2011    "get them very easily; my skin is thin and I bruise easily"  . Chronic back pain     "my entire spine"  . Edema   . Other and unspecified hyperlipidemia   . Encounter for long-term (current) use of other medications   . Senile osteoporosis   . Spinal stenosis, unspecified region other than cervical   . Unspecified glaucoma   . Hypothyroidism   . Complication of anesthesia 2009    htn with block- for hand surgery   . Anxiety     sometimes   . Rheumatoid arthritis(714.0)     degenerative disc disease    Past Surgical History  Procedure Laterality Date  . Back surgery    . Sinus surgery with instatrak    . Thyroidectomy, partial  1997  . Posterior fusion lumbar spine  10/31/2011    L2-3; 3-4  . Tonsillectomy      "when I was a small child"  . Functional endoscopic sinus surgery  1988  . Posterior fusion lumbar spine  11/2009    L3-4;  L4-5  . Joint replacement  2009; 2006    joints 2 fingers,rt hand; joint left thumb  . Spine surgery    . Posterior lumbar fusion N/A 08/05/13    T1, T2  . Eye surgery    . Anterior cervical decomp/discectomy fusion N/A 02/17/2014    Procedure: CERVICAL FOUR TO FIVE ANTERIOR CERVICAL DECOMPRESSION/DISCECTOMY FUSION 1 LEVEL;  Surgeon: [name], MD;  Location: [place] [facility];  Service: Neurosurgery;  Laterality: N/A;  C4-5 Anterior cervical decompression/diskectomy/fusion  . Cataract extraction Left 11/11/2013  . Cataract extraction Right 12/30/2013  . Total knee arthroplasty Left 06/16/2014    Procedure: TOTAL KNEE ARTHROPLASTY;  Surgeon: Paralee Cancel, MD;  Location: [facility];  Service: Orthopedics;  Laterality: Left;  . I&d knee with poly exchange Left 10/13/2014    Procedure: INCISION AND DRAINAGE LEFT TOTAL KNEE WITH POLY EXCHANGE;  Surgeon: Paralee Cancel, MD;  Location: [facility];  Service: Orthopedics;  Laterality: Left;   Social History:  reports that she  has been passively smoking Cigarettes.  She has a 12.5 pack-year smoking history. She uses smokeless tobacco. She reports that she drinks about 2.4 oz of alcohol per week. She reports that she does not use illicit drugs.  No Known Allergies  Family History:  Family History  Problem Relation Age of Onset  . Heart disease Mother   . Cancer Father     lung     Prior to Admission medications   Medication Sig Start Date End Date Taking? Authorizing Provider  acetaminophen (TYLENOL) 325 MG tablet Take 2 tablets (650 mg total) by mouth every 6 (six) hours as needed for mild pain (or Fever >/= 101). 10/15/14  Yes Matthew Babish, PA-C  Adalimumab (HUMIRA PEN [place]) Inject into the skin.   Yes Historical Provider, MD  Amino Acids-Protein Hydrolys (FEEDING SUPPLEMENT, PRO-STAT SUGAR FREE 64,) LIQD Take 30 mLs by mouth 2 (two) times daily.   Yes Historical Provider, MD  balsalazide (COLAZAL) 750 MG capsule Take 2,250 mg by mouth 2 (two) times daily.    Yes Historical Provider, MD  Calcium Carb-Cholecalciferol (CALCIUM +  D3) 600-200 MG-UNIT TABS Take 1 tablet by mouth daily.   Yes Historical Provider, MD  CELEBREX 200 MG capsule Take 200 mg by mouth 2 (two) times daily.  07/15/12  Yes Historical Provider, MD  Cholecalciferol (VITAMIN D3) 1000 UNITS CAPS Take 1,000 Units by mouth daily.   Yes Historical Provider, MD  [employer] DIGESTIVE PROBIOTIC 250 MG capsule Take 250 mg by mouth 2 (two) times daily. 09/28/14  Yes Historical Provider, MD  diazepam (VALIUM) 5 MG tablet Take 1 tablet (5 mg total) by mouth every 6 (six) hours as needed for muscle spasms. 10/15/14  Yes Danae Orleans, PA-C  docusate sodium (COLACE) 100 MG capsule Take 1 capsule (100 mg total) by mouth 2 (two) times daily. 10/15/14  Yes Danae Orleans, PA-C  estradiol (ESTRACE) 0.5 MG tablet Take 0.5 mg by mouth at bedtime.  06/18/12  Yes Historical Provider, MD  ferrous sulfate 325 (65 FE) MG tablet Take 1 tablet (325 mg total) by mouth 3 (three) times daily  after meals. 10/15/14  Yes Danae Orleans, PA-C  folic acid (FOLVITE) 1 MG tablet Take 2 mg by mouth daily.   Yes Historical Provider, MD  furosemide (LASIX) 40 MG tablet Take 40 mg by mouth 2 (two) times daily.   Yes Historical Provider, MD  guaiFENesin (MUCINEX) 600 MG 12 hr tablet Take 1,200 mg by mouth daily as needed (sinus relief).    Yes Historical Provider, MD  latanoprost (XALATAN) 0.005 % ophthalmic solution Place 1 drop into both eyes at bedtime.   Yes Historical Provider, MD  levothyroxine (SYNTHROID) 88 MCG tablet Take 88 mcg by mouth daily before breakfast. DAW, DO NOT SUBSTITUTE WITH GENERIC   Yes Historical Provider, MD  medroxyPROGESTERone (PROVERA) 2.5 MG tablet Take 2.5 mg by mouth at bedtime.  06/02/13  Yes Historical Provider, MD  metoprolol succinate (TOPROL-XL) 50 MG 24 hr tablet Take 50 mg by mouth 2 (two) times daily. Take with or immediately following a meal.   Yes Historical Provider, MD  Multiple Vitamin (MULTIVITAMIN WITH MINERALS) TABS Take 1 tablet by mouth daily.   Yes Historical Provider, MD  Omega-3 Fatty Acids (FISH OIL) 1200 MG CAPS Take 1,200 mg by mouth daily.   Yes Historical Provider, MD  oxyCODONE (OXY IR/ROXICODONE) 5 MG immediate release tablet Take 1-3 tablets (5-15 mg total) by mouth every 4 (four) hours as needed for severe pain. 10/15/14  Yes Danae Orleans, PA-C  polyethylene glycol (MIRALAX / GLYCOLAX) packet Take 17 g by mouth 2 (two) times daily. 10/15/14  Yes Danae Orleans, PA-C  potassium chloride SA (K-DUR,KLOR-CON) 20 MEQ tablet Take 20 mEq by mouth daily.   Yes Historical Provider, MD  predniSONE (DELTASONE) 5 MG tablet Take 5 mg by mouth daily with breakfast.    Yes Historical Provider, MD  simethicone (MYLICON) 80 MG chewable tablet Chew 80 mg by mouth 3 (three) times daily as needed for flatulence.   Yes Historical Provider, MD  tiZANidine (ZANAFLEX) 4 MG tablet Take 1 tablet (4 mg total) by mouth every 6 (six) hours as needed for muscle spasms.  10/15/14  Yes Danae Orleans, PA-C  Vancomycin (VANCOCIN) 750 MG/150ML SOLN Inject 150 mLs (750 mg total) into the vein every 12 (twelve) hours. Patient taking differently: Inject 750 mg into the vein every 12 (twelve) hours. Takes for 6 weeks stop on 09/15 10/15/14  Yes Danae Orleans, PA-C  warfarin (COUMADIN) 4 MG tablet Take 4 mg by mouth every Tuesday, Thursday, Saturday, and Sunday at 6 PM.  Yes Historical Provider, MD  warfarin (COUMADIN) 6 MG tablet Take 6 mg by mouth every Monday, Wednesday, and Friday.   Yes Historical Provider, MD  aspirin EC 325 MG EC tablet Take 1 tablet (325 mg total) by mouth 2 (two) times daily. 10/15/14 11/12/14  Danae Orleans, PA-C  collagenase (SANTYL) ointment Apply topically daily. Patient taking differently: Apply 1 application topically daily as needed (wounds).  09/28/14   Debbe Odea, MD  Potassium Gluconate 595 MG CAPS Take 1 capsule by mouth daily as needed (with furosemide.).    Historical Provider, MD   Physical Exam: Filed Vitals:   10/29/14 1636  BP: 145/63  Pulse: 58  Temp: 98.9 F (37.2 C)  TempSrc: Oral  Resp: 14  Height: 5\' 7"  (1.702 m)  Weight: 69.627 kg (153 lb 8 oz)  SpO2: 100%    Physical Exam  Constitutional: Appears well-developed and well-nourished. No distress.  HENT: Normocephalic. No tonsillar erythema or exudates Eyes: Conjunctivae are normal. No scleral icterus.  Neck: Normal ROM. Neck supple. No JVD. No tracheal deviation. No thyromegaly.  CVS: RRR, S1/S2 +, no murmurs, no gallops, no carotid bruit.  Pulmonary: Effort and breath sounds normal, no stridor, rhonchi, wheezes, rales.  Abdominal: Soft. BS +,  no distension, tenderness, rebound or guarding.  Musculoskeletal: left LE wrapped, RLE erythema and swelling noted   Lymphadenopathy: No lymphadenopathy noted, cervical, inguinal. Neuro: Alert. No focal neurologic deficits. Skin: Skin is warm and dry. Psychiatric: Normal mood and affect. Behavior, judgment, thought content  normal.   Labs on Admission:  Basic Metabolic Panel: No results for input(s): NA, K, CL, CO2, GLUCOSE, BUN, CREATININE, CALCIUM, MG, PHOS in the last 168 hours. Liver Function Tests: No results for input(s): AST, ALT, ALKPHOS, BILITOT, PROT, ALBUMIN in the last 168 hours. No results for input(s): LIPASE, AMYLASE in the last 168 hours. No results for input(s): AMMONIA in the last 168 hours. CBC: No results for input(s): WBC, NEUTROABS, HGB, HCT, MCV, PLT in the last 168 hours. Cardiac Enzymes: No results for input(s): CKTOTAL, CKMB, CKMBINDEX, TROPONINI in the last 168 hours. BNP: Invalid input(s): POCBNP CBG: No results for input(s): GLUCAP in the last 168 hours.  If 7PM-7AM, please contact night-coverage [email or web address] Password [facility] 10/29/2014, 5:12 PM

## 2014-10-29 NOTE — Progress Notes (Signed)
Pharmacy called to verify medications [name]. Brigitte Pulse, RN

## 2014-10-29 NOTE — Progress Notes (Signed)
Patient states that dressing on LLE was changed 8/17. Dressing dry and intact.  WOC ordered. Large 3cmx3cm intact blister on Right lower extremity.  Barbee Shropshire. Brigitte Pulse, RN

## 2014-10-29 NOTE — Progress Notes (Signed)
[facility] admissions paged

## 2014-10-29 NOTE — Progress Notes (Signed)
ANTICOAGULATION CONSULT NOTE - Initial Consult  Pharmacy Consult for warfarin Indication: VTE prophylaxis  No Known Allergies  Patient Measurements: Height: 5\' 7"  (170.2 cm) Weight: 153 lb 8 oz (69.627 kg) IBW/kg (Calculated) : 61.6  Vital Signs: Temp: 98.9 F (37.2 C) (08/18 1636) Temp Source: Oral (08/18 1636) BP: 145/63 mmHg (08/18 1636) Pulse Rate: 58 (08/18 1636)  Labs:  Recent Labs  10/29/14 1903  HGB 8.6*  HCT 28.0*  PLT 284  APTT 34  LABPROT 28.6*  INR 2.74*    Estimated Creatinine Clearance: 50.9 mL/min (by C-G formula based on Cr of 0.9).   Medical History: Past Medical History  Diagnosis Date  . Ulcerative colitis   . Hypothyroidism   . Hypertension     pcp   dr reed   [facility]  . Pneumonia 2008  . Chronic bronchitis 10/31/2011    "prone to it; I have sinus/bronchitis problem probably q yr"  . Multiple skin tears 10/31/2011    "get them very easily; my skin is thin and I bruise easily"  . Chronic back pain     "my entire spine"  . Edema   . Other and unspecified hyperlipidemia   . Encounter for long-term (current) use of other medications   . Senile osteoporosis   . Spinal stenosis, unspecified region other than cervical   . Unspecified glaucoma   . Hypothyroidism   . Complication of anesthesia 2009    htn with block- for hand surgery   . Anxiety     sometimes   . Rheumatoid arthritis(714.0)     degenerative disc disease     Medications:  Scheduled:  . balsalazide  2,250 mg Oral BID  . [START ON 10/30/2014] calcium-vitamin D  1 tablet Oral Q breakfast  . celecoxib  200 mg Oral BID  . [START ON 10/30/2014] cholecalciferol  1,000 Units Oral Daily  . docusate sodium  100 mg Oral BID  . feeding supplement (PRO-STAT SUGAR FREE 64)  30 mL Oral BID  . [START ON 10/30/2014] ferrous sulfate  325 mg Oral TID PC  . [START ON 9/52/8413] folic acid  2 mg Oral Daily  . furosemide  40 mg Oral BID  . latanoprost  1 drop Both Eyes QHS  . [START  ON 10/30/2014] levothyroxine  88 mcg Oral QAC breakfast  . medroxyPROGESTERone  2.5 mg Oral QHS  . metoprolol succinate  50 mg Oral BID  . [START ON 10/30/2014] multivitamin with minerals  1 tablet Oral Daily  . polyethylene glycol  17 g Oral BID  . [START ON 10/30/2014] potassium chloride SA  20 mEq Oral Daily  . [START ON 10/30/2014] predniSONE  5 mg Oral Q breakfast  . sodium chloride  10-40 mL Intracatheter Q12H  . sodium chloride  3 mL Intravenous Q12H  . Vancomycin  750 mg Intravenous Q12H   Infusions:  . sodium chloride 10 mL/hr at 10/29/14 1837    Assessment: 80 yo admitted from SNF with CHF. PMH includes s/p L TKA 06/16/14 followed by infected prosthetic joint with I&D and poly exchange of hardware with 6 weeks of IV vancomycin scheduled to complete on 11/26/14 per ID rec's. Patient also on warfarin prior to admission at a reported dose of 4mg  daily except 6mg  on MWF but appears to be new medication and not sure what indication is. Baseline INR therapeutic with last warfarin given last night at 9pm per home med list. CBC ok  Goal of Therapy:  INR 2-3  Plan:  1) Continue with warfarin home regimen as above - 4mg  tonight 2) Daily INR   Adrian Saran, PharmD, BCPS Pager [phone] 10/29/2014 7:38 PM

## 2014-10-29 NOTE — Progress Notes (Signed)
ANTIBIOTIC CONSULT NOTE - INITIAL  Pharmacy Consult for vancomycin Indication: PJI  No Known Allergies  Patient Measurements: Height: 5\' 7"  (170.2 cm) Weight: 153 lb 8 oz (69.627 kg) IBW/kg (Calculated) : 61.6  Vital Signs: Temp: 98.9 F (37.2 C) (08/18 1636) Temp Source: Oral (08/18 1636) BP: 145/63 mmHg (08/18 1636) Pulse Rate: 58 (08/18 1636) Intake/Output from previous day:   Intake/Output from this shift: Total I/O In: -  Out: 200 [Urine:200]  Labs: No results for input(s): WBC, HGB, PLT, LABCREA, CREATININE in the last 72 hours. Estimated Creatinine Clearance: 50.9 mL/min (by C-G formula based on Cr of 0.9). No results for input(s): VANCOTROUGH, VANCOPEAK, VANCORANDOM, GENTTROUGH, GENTPEAK, GENTRANDOM, TOBRATROUGH, TOBRAPEAK, TOBRARND, AMIKACINPEAK, AMIKACINTROU, AMIKACIN in the last 72 hours.   Microbiology: Recent Results (from the past 720 hour(s))  Anaerobic culture     Status: None   Collection Time: 10/13/14  5:08 PM  Result Value Ref Range Status   Specimen Description SYNOVIAL  Final   Special Requests PATIENT ON FOLLOWING VANCOMYCIN IV  Final   Gram Stain   Final    FEW WBC PRESENT,BOTH PMN AND MONONUCLEAR NO ORGANISMS SEEN Performed at [facility]    Culture   Final    NO ANAEROBES ISOLATED Performed at [facility]    Report Status 10/17/2014 FINAL  Final  Anaerobic culture     Status: None   Collection Time: 10/13/14  5:08 PM  Result Value Ref Range Status   Specimen Description SYNOVIAL LEFT KNEE  Final   Special Requests PATIENT ON FOLLOWING VANCOMYCIN IV  Final   Gram Stain   Final    NO WBC SEEN NO ORGANISMS SEEN Performed at [facility]    Culture   Final    NO ANAEROBES ISOLATED Performed at [facility]    Report Status 10/17/2014 FINAL  Final  Body fluid culture     Status: None   Collection Time: 10/13/14  5:08 PM  Result Value Ref Range Status   Specimen Description SYNOVIAL LEFT KNEE  Final    Special Requests PATIENT ON FOLLOWING VANCOMYCIN IV SWAB SPECIMEN  Final   Gram Stain   Final    MODERATE WBC PRESENT,BOTH PMN AND MONONUCLEAR NO ORGANISMS SEEN    Culture   Final    NO GROWTH 3 DAYS Performed at [facility]    Report Status 10/16/2014 FINAL  Final  Body fluid culture     Status: None   Collection Time: 10/13/14  5:08 PM  Result Value Ref Range Status   Specimen Description SYNOVIAL LEFT KNEE  Final   Special Requests PATIENT ON FOLLOWING VANCOMYCIN IV  Final   Gram Stain   Final    ABUNDANT WBC PRESENT, PREDOMINANTLY PMN NO ORGANISMS SEEN    Culture   Final    NO GROWTH 3 DAYS Performed at [facility]    Report Status 10/16/2014 FINAL  Final  Culture, blood (routine x 2)     Status: None   Collection Time: 10/13/14 11:10 PM  Result Value Ref Range Status   Specimen Description BLOOD RIGHT HAND  Final   Special Requests BOTTLES DRAWN AEROBIC ONLY 5ML  Final   Culture   Final    NO GROWTH 5 DAYS Performed at [facility]    Report Status 10/19/2014 FINAL  Final  Culture, blood (routine x 2)     Status: None   Collection Time: 10/13/14 11:14 PM  Result Value Ref  Range Status   Specimen Description BLOOD RIGHT ANTECUBITAL  Final   Special Requests BOTTLES DRAWN AEROBIC ONLY 5ML  Final   Culture   Final    NO GROWTH 5 DAYS Performed at [facility]    Report Status 10/19/2014 FINAL  Final    Medical History: Past Medical History  Diagnosis Date  . Ulcerative colitis   . Hypothyroidism   . Hypertension     pcp   dr reed   [facility]  . Pneumonia 2008  . Chronic bronchitis 10/31/2011    "prone to it; I have sinus/bronchitis problem probably q yr"  . Multiple skin tears 10/31/2011    "get them very easily; my skin is thin and I bruise easily"  . Chronic back pain     "my entire spine"  . Edema   . Other and unspecified hyperlipidemia   . Encounter for long-term (current) use of other medications   . Senile  osteoporosis   . Spinal stenosis, unspecified region other than cervical   . Unspecified glaucoma   . Hypothyroidism   . Complication of anesthesia 2009    htn with block- for hand surgery   . Anxiety     sometimes   . Rheumatoid arthritis(714.0)     degenerative disc disease     Medications:  Scheduled:  . aspirin EC  325 mg Oral BID  . balsalazide  2,250 mg Oral BID  . Calcium + D3  1 tablet Oral Daily  . celecoxib  200 mg Oral BID  . docusate sodium  100 mg Oral BID  . feeding supplement (PRO-STAT SUGAR FREE 64)  30 mL Oral BID  . ferrous sulfate  325 mg Oral TID PC  . folic acid  2 mg Oral Daily  . furosemide  40 mg Oral BID  . latanoprost  1 drop Both Eyes QHS  . [START ON 10/30/2014] levothyroxine  88 mcg Oral QAC breakfast  . medroxyPROGESTERone  2.5 mg Oral QHS  . metoprolol succinate  50 mg Oral BID  . multivitamin with minerals  1 tablet Oral Daily  . polyethylene glycol  17 g Oral BID  . potassium chloride SA  20 mEq Oral Daily  . [START ON 10/30/2014] predniSONE  5 mg Oral Q breakfast  . sodium chloride  10-40 mL Intracatheter Q12H  . sodium chloride  3 mL Intravenous Q12H  . Vancomycin  750 mg Intravenous Q12H  . Vitamin D3  1,000 Units Oral Daily  . warfarin  4 mg Oral Q T,Th,S,Su-1800  . [START ON 10/30/2014] warfarin  6 mg Oral Q M,W,F   Infusions:  . sodium chloride     Assessment: 80 yo admitted from SNF with CHF. PMH includes s/p L TKA 06/16/14 followed by infected prosthetic joint with I&D and poly exchange of hardware with 6 weeks of IV vancomycin scheduled to complete on 11/26/14 per ID rec's. Patient also on warfarin prior to admission at a reported dose of 4mg  daily except 6mg  on MWF but appears to be new medication and not sure what indication actually is  8/4 >> Vancomycin 750mg  IV q12 >> 9/15 per ID rec's per previous admission but note that patient's last vanc dose per NH MAR was administered on 8/17 at 6am so she has missed the 2 doses  Goal of  Therapy:  Vancomycin trough level 15-20 mcg/ml  Plan:  1) Restart vancomycin 750mg  IV q12 as per home regimen 2) Will recheck renal function in  AM prior to next dose tomorrow AM 3) Check vanc trough at reestablished steady state   Adrian Saran, PharmD, BCPS Pager [phone] 10/29/2014 7:48 PM

## 2014-10-30 ENCOUNTER — Ambulatory Visit (HOSPITAL_COMMUNITY): Payer: Medicare Other | Attending: Internal Medicine

## 2014-10-30 DIAGNOSIS — R609 Edema, unspecified: Secondary | ICD-10-CM

## 2014-10-30 DIAGNOSIS — M7989 Other specified soft tissue disorders: Secondary | ICD-10-CM | POA: Insufficient documentation

## 2014-10-30 DIAGNOSIS — I5032 Chronic diastolic (congestive) heart failure: Secondary | ICD-10-CM

## 2014-10-30 DIAGNOSIS — N179 Acute kidney failure, unspecified: Secondary | ICD-10-CM

## 2014-10-30 LAB — COMPREHENSIVE METABOLIC PANEL
ALBUMIN: 2.8 g/dL — AB (ref 3.5–5.0)
ALT: 10 U/L — ABNORMAL LOW (ref 14–54)
ANION GAP: 8 (ref 5–15)
AST: 21 U/L (ref 15–41)
Alkaline Phosphatase: 118 U/L (ref 38–126)
BUN: 24 mg/dL — AB (ref 6–20)
CHLORIDE: 105 mmol/L (ref 101–111)
CO2: 28 mmol/L (ref 22–32)
Calcium: 8.3 mg/dL — ABNORMAL LOW (ref 8.9–10.3)
Creatinine, Ser: 1.58 mg/dL — ABNORMAL HIGH (ref 0.44–1.00)
GFR calc Af Amer: 35 mL/min — ABNORMAL LOW (ref >60)
GFR, EST NON AFRICAN AMERICAN: 30 mL/min — AB (ref >60)
Glucose, Bld: 92 mg/dL (ref 65–99)
POTASSIUM: 3.9 mmol/L (ref 3.5–5.1)
Sodium: 141 mmol/L (ref 135–145)
Total Bilirubin: 0.7 mg/dL (ref 0.3–1.2)
Total Protein: 4.9 g/dL — ABNORMAL LOW (ref 6.5–8.1)

## 2014-10-30 LAB — CBC
HEMATOCRIT: 27.1 % — AB (ref 36.0–46.0)
HEMOGLOBIN: 8.6 g/dL — AB (ref 12.0–15.0)
MCH: 29.7 pg (ref 26.0–34.0)
MCHC: 31.7 g/dL (ref 30.0–36.0)
MCV: 93.4 fL (ref 78.0–100.0)
Platelets: 288 10*3/uL (ref 150–400)
RBC: 2.9 MIL/uL — ABNORMAL LOW (ref 3.87–5.11)
RDW: 19.7 % — AB (ref 11.5–15.5)
WBC: 6.8 10*3/uL (ref 4.0–10.5)

## 2014-10-30 LAB — PROTIME-INR
INR: 2.93 — ABNORMAL HIGH (ref 0.00–1.49)
Prothrombin Time: 30.1 seconds — ABNORMAL HIGH (ref 11.6–15.2)

## 2014-10-30 LAB — GLUCOSE, CAPILLARY: GLUCOSE-CAPILLARY: 91 mg/dL (ref 65–99)

## 2014-10-30 MED ORDER — FUROSEMIDE 40 MG PO TABS: 40.0000 mg | ORAL_TABLET | Freq: Every day | ORAL | Status: DC

## 2014-10-30 MED ORDER — HEPARIN SOD (PORK) LOCK FLUSH 100 UNIT/ML IV SOLN
250.0000 [IU] | INTRAVENOUS | Status: DC | PRN
Start: 2014-10-30 — End: 2014-10-30
  Administered 2014-10-30: 250 [IU]
  Filled 2014-10-30: qty 3

## 2014-10-30 MED ORDER — VANCOMYCIN HCL IN DEXTROSE 750-5 MG/150ML-% IV SOLN: 750.0000 mg | INTRAVENOUS | Status: DC

## 2014-10-30 MED ORDER — WARFARIN SODIUM 4 MG PO TABS
4.0000 mg | ORAL_TABLET | Freq: Once | ORAL | Status: DC
  Filled 2014-10-30: qty 1

## 2014-10-30 MED ORDER — EYE WASH OPHTH SOLN
1.0000 [drp] | OPHTHALMIC | Status: DC | PRN
  Administered 2014-10-30: 1 [drp] via OPHTHALMIC
  Filled 2014-10-30: qty 118

## 2014-10-30 MED ORDER — HEPARIN SOD (PORK) LOCK FLUSH 100 UNIT/ML IV SOLN
250.0000 [IU] | Freq: Every day | INTRAVENOUS | Status: DC
  Filled 2014-10-30: qty 3

## 2014-10-30 NOTE — Progress Notes (Signed)
VASCULAR LAB PRELIMINARY  PRELIMINARY  PRELIMINARY  PRELIMINARY  Bilateral lower extremity venous duplex completed.    Preliminary report:  Bilateral:  No evidence of DVT, superficial thrombosis, or Baker's Cyst. Moderate interstitial fluid noted bilaterally from the popliteal fossa to the ankle.   Anthonyjames Bargar, RVS 10/30/2014, 11:55 AM

## 2014-10-30 NOTE — Progress Notes (Signed)
Report called to [facility] Margarito Courser RN accepting report. VSS and no acute changes noted at time of discharge

## 2014-10-30 NOTE — Consult Note (Signed)
WOC wound consult note Reason for Consult: Discharge planned today.  Wound care to left anterior and lateral lower extremity.  Blood filled blister present to right anterior calf, resolving cellulitis.  Wound type:Cellulitis to right anterior calf, resolving.  Chronic venous stasis ulcers to left calf.  Pressure Ulcer POA: N/A Measurement:Right anterior calf 4 cm x 3 cm raised blood filled blister.  Intact at this time.  Left anterior calf:  2 lesions  1.5 cm x 1 cm x 0.2 cm proximal and 1 cm x 1 cm x 0.1 cm distal both are 50% fibrin slough. Moderate serosanguinous drainage.  Left lateral calf 0.5 cm in diameter pink and moist Wound LKJ:ZPHXTA slough  Periwound: Dry skin, generalized edema.  Patient wears removable compression at skilled facility.  Will apply once she arrives to facility.  Dressing procedure/placement/frequency:Cleanse bilateral lower legs with NS and pat gently dry.  Apply Aquacel Ag to open lesions (and blood filled blister as I believe this will inevitably rupture and open).  Cover with 4x4 gauze and secure with kerlix and tape.  Wrap from below toes to just below knee. Change twice weekly.  Will not follow at this time.  Please re-consult if needed.  Domenic Moras RN BSN [place] Pager [phone]

## 2014-10-30 NOTE — Progress Notes (Signed)
CMT notified writer of patient having an 8 beat run of Vtach. Patient asymptomatic. NP on call notified, will continue to monitor

## 2014-10-30 NOTE — Progress Notes (Signed)
Pharmacy - Antibiotic dosing  Assessment: 42 yoF on vancomycin for PJI, continuing from PTA administration.  New AKI overnight with CrCl now 29 ml/min.  Received 2 doses in hospital so far  Plan:  Reduce vancomycin to 750 mg IV q24 with new AKI.  Patient is borderline for this dose, but I am concerned her renal function may continue to decline in the immediate future.  F/u SCr  Reuel Boom, PharmD, BCPS Pager: [phone] 10/30/2014, 9:45 AM

## 2014-10-30 NOTE — Progress Notes (Signed)
Patient is set to discharge back to [facility] today. Patient & husband aware. Discharge packet given to RN, Anderson Malta. Husband to transport back to SNF after she eats lunch.     Raynaldo Opitz, [facility] Clinical Social Worker cell #: [phone]

## 2014-10-30 NOTE — Progress Notes (Signed)
ANTICOAGULATION CONSULT NOTE - Follow Up Consult  Pharmacy Consult for warfarin Indication: atrial fibrillation  No Known Allergies  Patient Measurements: Height: 5\' 7"  (170.2 cm) Weight: 151 lb 3.2 oz (68.584 kg) IBW/kg (Calculated) : 61.6  Vital Signs: Temp: 98 F (36.7 C) (08/19 0600) Temp Source: Oral (08/19 0600) BP: 120/60 mmHg (08/19 0600) Pulse Rate: 63 (08/19 0600)  Labs:  Recent Labs  10/29/14 1903 10/30/14 0422  HGB 8.6* 8.6*  HCT 28.0* 27.1*  PLT 284 288  APTT 34  --   LABPROT 28.6* 30.1*  INR 2.74* 2.93*  CREATININE 1.14* 1.58*    Estimated Creatinine Clearance: 29 mL/min (by C-G formula based on Cr of 1.58).   Medications:  Prescriptions prior to admission  Medication Sig Dispense Refill Last Dose  . acetaminophen (TYLENOL) 325 MG tablet Take 2 tablets (650 mg total) by mouth every 6 (six) hours as needed for mild pain (or Fever >/= 101).   unknown  . balsalazide (COLAZAL) 750 MG capsule Take 2,250 mg by mouth 2 (two) times daily.    10/29/2014 at 0900  . Calcium Carb-Cholecalciferol (CALCIUM + D3) 600-200 MG-UNIT TABS Take 1 tablet by mouth daily with breakfast.    10/29/2014 at 0900  . CELEBREX 200 MG capsule Take 200 mg by mouth 2 (two) times daily.    10/29/2014 at 0900  . Cholecalciferol (VITAMIN D3) 1000 UNITS CAPS Take 1,000 Units by mouth daily.   10/29/2014 at 0900  . [employer] DIGESTIVE PROBIOTIC 250 MG capsule Take 250 mg by mouth 2 (two) times daily.  0 10/29/2014 at 0900  . diazepam (VALIUM) 5 MG tablet Take 1 tablet (5 mg total) by mouth every 6 (six) hours as needed for muscle spasms. 30 tablet 0 10/28/2014 at 2056  . docusate sodium (COLACE) 100 MG capsule Take 1 capsule (100 mg total) by mouth 2 (two) times daily. 10 capsule 0 10/29/2014 at 0900  . estradiol (ESTRACE) 0.5 MG tablet Take 0.5 mg by mouth at bedtime.    10/28/2014 at 2100  . ferrous sulfate 325 (65 FE) MG tablet Take 1 tablet (325 mg total) by mouth 3 (three) times daily after meals.  3  10/29/2014 at 1300  . folic acid (FOLVITE) 1 MG tablet Take 2 mg by mouth daily.   10/29/2014 at 0900  . furosemide (LASIX) 40 MG tablet Take 40 mg by mouth 2 (two) times daily.   10/29/2014 at 0900  . guaiFENesin (MUCINEX) 600 MG 12 hr tablet Take 1,200 mg by mouth daily as needed (sinus relief).    unknown  . latanoprost (XALATAN) 0.005 % ophthalmic solution Place 1 drop into both eyes at bedtime.   10/28/2014 at 2100  . levothyroxine (SYNTHROID) 88 MCG tablet Take 88 mcg by mouth daily before breakfast. DAW, DO NOT SUBSTITUTE WITH GENERIC   10/29/2014 at 0630  . medroxyPROGESTERone (PROVERA) 2.5 MG tablet Take 2.5 mg by mouth at bedtime.    10/28/2014 at 2100  . metoprolol succinate (TOPROL-XL) 50 MG 24 hr tablet Take 50 mg by mouth 2 (two) times daily. Take with or immediately following a meal.   10/29/2014 at 0900  . Multiple Vitamin (MULTIVITAMIN WITH MINERALS) TABS Take 1 tablet by mouth daily.   10/29/2014 at 0900  . Omega-3 Fatty Acids (FISH OIL) 1200 MG CAPS Take 1,200 mg by mouth daily.   10/29/2014 at 0900  . oxyCODONE (OXY IR/ROXICODONE) 5 MG immediate release tablet Take 1-3 tablets (5-15 mg total) by mouth every 4 (four)  hours as needed for severe pain. 120 tablet 0 10/29/2014 at 1030  . polyethylene glycol (MIRALAX / GLYCOLAX) packet Take 17 g by mouth 2 (two) times daily. 14 each 0 10/29/2014 at 0900  . potassium chloride SA (K-DUR,KLOR-CON) 20 MEQ tablet Take 20 mEq by mouth daily.   10/29/2014 at 0900  . predniSONE (DELTASONE) 5 MG tablet Take 5 mg by mouth daily with breakfast.    10/29/2014 at 0900  . simethicone (MYLICON) 80 MG chewable tablet Chew 80 mg by mouth 3 (three) times daily as needed for flatulence.   unknown  . tiZANidine (ZANAFLEX) 4 MG tablet Take 1 tablet (4 mg total) by mouth every 6 (six) hours as needed for muscle spasms. 40 tablet 0 unknown  . Vancomycin (VANCOCIN) 750 MG/150ML SOLN Inject 150 mLs (750 mg total) into the vein every 12 (twelve) hours. (Patient taking  differently: Inject 750 mg into the vein every 12 (twelve) hours. Takes for 6 weeks stop on 09/15) 12600 mL 0 10/28/2014 at 0600  . warfarin (COUMADIN) 4 MG tablet Take 4 mg by mouth every Tuesday, Thursday, Saturday, and Sunday at 6 PM.   10/27/2014  . warfarin (COUMADIN) 6 MG tablet Take 6 mg by mouth every Monday, Wednesday, and Friday.   10/28/2014 at 2100   Scheduled:  . balsalazide  2,250 mg Oral BID  . calcium-vitamin D  1 tablet Oral Q breakfast  . celecoxib  200 mg Oral BID  . cholecalciferol  1,000 Units Oral Daily  . docusate sodium  100 mg Oral BID  . feeding supplement (PRO-STAT SUGAR FREE 64)  30 mL Oral BID  . ferrous sulfate  325 mg Oral TID PC  . folic acid  2 mg Oral Daily  . furosemide  40 mg Oral BID  . latanoprost  1 drop Both Eyes QHS  . levothyroxine  88 mcg Oral QAC breakfast  . medroxyPROGESTERone  2.5 mg Oral QHS  . metoprolol succinate  50 mg Oral BID  . multivitamin with minerals  1 tablet Oral Daily  . polyethylene glycol  17 g Oral BID  . potassium chloride SA  20 mEq Oral Daily  . predniSONE  5 mg Oral Q breakfast  . sodium chloride  10-40 mL Intracatheter Q12H  . sodium chloride  3 mL Intravenous Q12H  . Vancomycin  750 mg Intravenous Q12H  . Warfarin - Pharmacist Dosing Inpatient   Does not apply q1800   Assessment: 80 yo admitted from SNF with CHF. PMH includes s/p L TKA 06/16/14 followed by infected prosthetic joint with I&D and poly exchange of hardware with 6 weeks of IV vancomycin scheduled to complete on 11/26/14 per ID rec's. Per NH records, patient also started on warfarin 8/7 by NH MD for new AFib and stopped ASA BID from ortho.   Baseline INR therapeutic  Prior anticoagulation: warfarin 4 mg daily, except for 6 mg MWF  Significant events:  Today, 10/30/2014:  CBC: Hgb low but stable overnight; Plt wnl  INR therapeutic at high end of range  Major drug interactions: celecoxib from PTA  No bleeding issues per nursing  Regular  diet  Goal of Therapy: INR 2-3  Plan:  Warfarin 4 mg PO tonight at 18:00.  Due for 6 mg today, but with acute illness, expect INR may rise.  Would continue 4 mg/d as long as in upper therapeutic range  Daily INR  CBC at least q72 hr while on warfarin  Monitor for signs of bleeding or  thrombosis   Reuel Boom, PharmD Pager: [phone] 10/30/2014, 9:04 AM

## 2014-10-30 NOTE — Care Management Note (Signed)
Case Management Note  Patient Details  Name: [name] MRN: [id number] Date of Birth: 02-Mar-1935  Subjective/Objective: 80 y/o f admitted w/Cellulitis LE. From SNF.                   Action/Plan:d/c back to SNF.   Expected Discharge Date:   (unknown)               Expected Discharge Plan:  Assisted Living / Rest Home  In-House Referral:  Clinical Social Work  Discharge planning Services  CM Consult  Post Acute Care Choice:    Choice offered to:     DME Arranged:    DME Agency:     HH Arranged:    [place] Agency:     Status of Service:  Completed, signed off  Medicare Important Message Given:    Date Medicare IM Given:    Medicare IM give by:    Date Additional Medicare IM Given:    Additional Medicare Important Message give by:     If discussed at [place] of Stay Meetings, dates discussed:    Additional Comments:  Dessa Phi, RN 10/30/2014, 10:23 AM

## 2014-10-30 NOTE — Progress Notes (Deleted)
VASCULAR LAB PRELIMINARY  PRELIMINARY  PRELIMINARY  PRELIMINARY  Bilateral lower extremity venous duplex completed.    Preliminary report:  Bilateral:  No evidence of DVT, superficial thrombosis, or Baker's Cyst.   Hugh Garrow, RVS 10/30/2014, 11:27 AM

## 2014-10-30 NOTE — Discharge Summary (Addendum)
Physician Discharge Summary  [name] UMP:[id number] DOB: [birth year] DOA: 10/29/2014  PCP: Hollace Kinnier, DO  Admit date: 10/29/2014 Discharge date: 10/30/2014  Recommendations for Outpatient Follow-up:  1. Please note we have reduced lasix to once a day regime due to worsening renal function 2. Tylenol can be used for pain control in addition to robaxin. Avoid benzos and meds in similar category as they can cause more confusion in elderly patients. 3.Please continue vancomycin through 11/26/2014 which is recommendation from ID from previous hospitalization for infected knee prosthesis. This would cover for possible LE cellulitis 4. Continue coumadin per ortho recommendations for VTE prophylaxis   Discharge Diagnoses:  Principal Problem:   Cellulitis Active Problems:   Acute on chronic diastolic CHF (congestive heart failure), NYHA class 1   Rheumatoid arthritis   Hypothyroidism   Benign essential HTN   Infection of prosthetic left knee joint   Anemia, iron deficiency   Ulcerative colitis, chronic    Discharge Condition: stable   Diet recommendation: as tolerated   History of present illness:  80 year old female with past medical history of hypertension, ulcerative colitis, rheumatoid arthritis, hypothyroidism, chronic diastolic CHF (last 2 D ECHO in 04/2014 showed preserver EF with grade 1 diastolic dysfunction), recent left total knee replacement and then hospitalization from 10/13/14 to 10/15/14 for infected left knee arthroplasty. She presented to orthopedic clinic today with swelling in lower extremities and shortness of breath. She was referred to [facility] for an admission from orthopedic office. Pt reported her swelling was evident for last 1 week or so but started to get progressively worse over day or so with more redness extending from down below the knee to her ankle. She reported some shortness of breath but no fevers or chills.   Hospital Course:   Assessment & Plan     Principal Problem:  Cellulitis of lower extremities, bilaterally / Infection of prosthetic left knee joint - Pt is already on vancomycin from previous admission.  - Continue vanco through 11/26/2014 as previously recommended for infected knee prosthesis  - Obtain LE doppler to rule out DVT - study pending but will be updated prior to discharge  - Coumadin for VTE prophylaxis as per previous ortho recommendations  - Appreciate WOC assessment   Active Problems:  Chronic diastolic CHF (congestive heart failure), NYHA class 1 - Last 2 D ECHO in 04/2014 showed preserved F with grade 1 diastolic dysfunction - Compensated - No evidence of acute CHF on CXR - Continue lasix 40 mg daily instead of BID due ot worsening renal function    Rheumatoid arthritis - Continue low dose prednisone    Acute renal failure - From lasix so will reduce the frequenc form 40 mg BID to daily  - Monitor renal function per SNF protocol     Anemia of chronic disease /Anemia, iron deficiency - Due to h/o UC and RA - Hemoglobin stable at 5.6 - No current indications for transfusion  - Continue ferrous sulfate supplementation    Hypothyroidism - Continue synthroid   Benign essential HTN - Resume metoprolol      Ulcerative colitis, chronic - Stable - No abdominal pain, no diarrhea, no rectal bleed    DVT prophylaxis:  - On full dose anticoagulation with coumadin     Signed:  Leisa Lenz, MD  [facility] 10/30/2014, 9:52 AM  Pager #: [phone]  Time spent in minutes: more than 30 minutes  Procedures:  LE doppler  Consultations:  [place]  Discharge Exam:  Filed Vitals:   10/30/14 0600  BP: 120/60  Pulse: 63  Temp: 98 F (36.7 C)  Resp: 16   Filed Vitals:   10/29/14 1636 10/29/14 2219 10/30/14 0600  BP: 145/63 148/71 120/60  Pulse: 58 75 63  Temp: 98.9 F (37.2 C) 98 F (36.7 C) 98 F (36.7 C)  TempSrc: Oral Oral Oral  Resp: 14 16 16   Height: 5\' 7"   (1.702 m)    Weight: 69.627 kg (153 lb 8 oz)  68.584 kg (151 lb 3.2 oz)  SpO2: 100% 97% 98%    General: Pt is alert, follows commands appropriately, not in acute distress Cardiovascular: Regular rate and rhythm, S1/S2 +, no murmurs Respiratory: Clear to auscultation bilaterally, no wheezing, no crackles, no rhonchi Abdominal: Soft, non tender, non distended, bowel sounds +, no guarding Extremities: LE edema, LLE wrapped, RLE with blisters and erythema, no cyanosis, pulses palpable bilaterally DP and PT Neuro: Grossly nonfocal  Discharge Instructions  Discharge Instructions    Call MD for:  difficulty breathing, headache or visual disturbances    Complete by:  As directed      Call MD for:  persistant nausea and vomiting    Complete by:  As directed      Call MD for:  severe uncontrolled pain    Complete by:  As directed      Diet - low sodium heart healthy    Complete by:  As directed      Discharge instructions    Complete by:  As directed   1. Please note we have reduced lasix to once a day regime due to worsening renal function 2. Tylenol can be used for pain control in addition to robaxin. Avoid benzos and meds in similar category as they can cause more confusion in elderly patients.     Increase activity slowly    Complete by:  As directed             Medication List    STOP taking these medications        diazepam 5 MG tablet  Commonly known as:  VALIUM     oxyCODONE 5 MG immediate release tablet  Commonly known as:  Oxy IR/ROXICODONE      TAKE these medications        acetaminophen 325 MG tablet  Commonly known as:  TYLENOL  Take 2 tablets (650 mg total) by mouth every 6 (six) hours as needed for mild pain (or Fever >/= 101).     balsalazide 750 MG capsule  Commonly known as:  COLAZAL  Take 2,250 mg by mouth 2 (two) times daily.     Calcium + D3 600-200 MG-UNIT Tabs  Take 1 tablet by mouth daily with breakfast.     CELEBREX 200 MG capsule  Generic  drug:  celecoxib  Take 200 mg by mouth 2 (two) times daily.     [employer] DIGESTIVE PROBIOTIC 250 MG capsule  Generic drug:  saccharomyces boulardii  Take 250 mg by mouth 2 (two) times daily.     docusate sodium 100 MG capsule  Commonly known as:  COLACE  Take 1 capsule (100 mg total) by mouth 2 (two) times daily.     estradiol 0.5 MG tablet  Commonly known as:  ESTRACE  Take 0.5 mg by mouth at bedtime.     ferrous sulfate 325 (65 FE) MG tablet  Take 1 tablet (325 mg total) by mouth 3 (three) times daily after meals.  Fish Oil 1200 MG Caps  Take 1,200 mg by mouth daily.     folic acid 1 MG tablet  Commonly known as:  FOLVITE  Take 2 mg by mouth daily.     furosemide 40 MG tablet  Commonly known as:  LASIX  Take 1 tablet (40 mg total) by mouth daily.     guaiFENesin 600 MG 12 hr tablet  Commonly known as:  MUCINEX  Take 1,200 mg by mouth daily as needed (sinus relief).     latanoprost 0.005 % ophthalmic solution  Commonly known as:  XALATAN  Place 1 drop into both eyes at bedtime.     medroxyPROGESTERone 2.5 MG tablet  Commonly known as:  PROVERA  Take 2.5 mg by mouth at bedtime.     metoprolol succinate 50 MG 24 hr tablet  Commonly known as:  TOPROL-XL  Take 50 mg by mouth 2 (two) times daily. Take with or immediately following a meal.     multivitamin with minerals Tabs tablet  Take 1 tablet by mouth daily.     polyethylene glycol packet  Commonly known as:  MIRALAX / GLYCOLAX  Take 17 g by mouth 2 (two) times daily.     potassium chloride SA 20 MEQ tablet  Commonly known as:  K-DUR,KLOR-CON  Take 20 mEq by mouth daily.     predniSONE 5 MG tablet  Commonly known as:  DELTASONE  Take 5 mg by mouth daily with breakfast.     simethicone 80 MG chewable tablet  Commonly known as:  MYLICON  Chew 80 mg by mouth 3 (three) times daily as needed for flatulence.     SYNTHROID 88 MCG tablet  Generic drug:  levothyroxine  Take 88 mcg by mouth daily before breakfast.  DAW, DO NOT SUBSTITUTE WITH GENERIC     tiZANidine 4 MG tablet  Commonly known as:  ZANAFLEX  Take 1 tablet (4 mg total) by mouth every 6 (six) hours as needed for muscle spasms.     Vancomycin 750 MG/150ML Soln  Commonly known as:  VANCOCIN  Inject 150 mLs (750 mg total) into the vein every 12 (twelve) hours.     Vitamin D3 1000 UNITS Caps  Take 1,000 Units by mouth daily.     warfarin 6 MG tablet  Commonly known as:  COUMADIN  Take 6 mg by mouth every Monday, Wednesday, and Friday.     warfarin 4 MG tablet  Commonly known as:  COUMADIN  Take 4 mg by mouth every Tuesday, Thursday, Saturday, and Sunday at 6 PM.           Follow-up Information    Follow up with [name], [name], DO. Schedule an appointment as soon as possible for a visit in 2 weeks.   Specialty:  Geriatric Medicine   Why:  Follow up appt after recent hospitalization   Contact information:   [address] [place] [address] [phone]        The results of significant diagnostics from this hospitalization (including imaging, microbiology, ancillary and laboratory) are listed below for reference.    Significant Diagnostic Studies: Dg Chest Port 1 View  10/29/2014   CLINICAL DATA:  Shortness of breath and hypertension.  EXAM: PORTABLE CHEST - 1 VIEW  COMPARISON:  Jul 31, 2013  FINDINGS: There is a minimal left pleural effusion. There is a stable 6 mm nodular opacity in the right mid lung region. There is no edema or consolidation. Heart size and pulmonary vascularity  are normal. No adenopathy. Central catheter tip is in the superior cava. No pneumothorax. There is postoperative change in the lumbar spine.  IMPRESSION: Rather minimal left pleural effusion. Stable 6 mm nodular opacity right mid lung region. No edema or consolidation. Central catheter tip in superior vena cava. No adenopathy.   Electronically Signed   By: Lowella Grip III M.D.   On: 10/29/2014 17:36    Microbiology: No results found for  this or any previous visit (from the past 240 hour(s)).   Labs: Basic Metabolic Panel:  Recent Labs Lab 10/29/14 1903 10/30/14 0422  NA 140 141  K 3.7 3.9  CL 105 105  CO2 30 28  GLUCOSE 93 92  BUN 19 24*  CREATININE 1.14* 1.58*  CALCIUM 8.5* 8.3*  MG 2.3  --   PHOS 3.7  --    Liver Function Tests:  Recent Labs Lab 10/29/14 1903 10/30/14 0422  AST 23 21  ALT 11* 10*  ALKPHOS 131* 118  BILITOT 0.7 0.7  PROT 5.6* 4.9*  ALBUMIN 3.0* 2.8*   No results for input(s): LIPASE, AMYLASE in the last 168 hours. No results for input(s): AMMONIA in the last 168 hours. CBC:  Recent Labs Lab 10/29/14 1903 10/30/14 0422  WBC 8.8 6.8  NEUTROABS 6.8  --   HGB 8.6* 8.6*  HCT 28.0* 27.1*  MCV 92.1 93.4  PLT 284 288   Cardiac Enzymes: No results for input(s): CKTOTAL, CKMB, CKMBINDEX, TROPONINI in the last 168 hours. BNP: BNP (last 3 results)  Recent Labs  10/29/14 1903  BNP 853.9*    ProBNP (last 3 results) No results for input(s): PROBNP in the last 8760 hours.  CBG:  Recent Labs Lab 10/30/14 0749  GLUCAP 91

## 2014-10-30 NOTE — Discharge Instructions (Addendum)
Edema  Edema is an abnormal buildup of fluids. It is more common in your legs and thighs. Painless swelling of the feet and ankles is more likely as a person ages. It also is common in looser skin, like around your eyes.  HOME CARE   · Keep the affected body part above the level of the heart while lying down.  · Do not sit still or stand for a long time.  · Do not put anything right under your knees when you lie down.  · Do not wear tight clothes on your upper legs.  · Exercise your legs to help the puffiness (swelling) go down.  · Wear elastic bandages or support stockings as told by your doctor.  · A low-salt diet may help lessen the puffiness.  · Only take medicine as told by your doctor.  GET HELP IF:  · Treatment is not working.  · You have heart, liver, or kidney disease and notice that your skin looks puffy or shiny.  · You have puffiness in your legs that does not get better when you raise your legs.  · You have sudden weight gain for no reason.  GET HELP RIGHT AWAY IF:   · You have shortness of breath or chest pain.  · You cannot breathe when you lie down.  · You have pain, redness, or warmth in the areas that are puffy.  · You have heart, liver, or kidney disease and get edema all of a sudden.  · You have a fever and your symptoms get worse all of a sudden.  MAKE SURE YOU:   · Understand these instructions.  · Will watch your condition.  · Will get help right away if you are not doing well or get worse.  Document Released: 08/16/2007 Document Revised: 03/04/2013 Document Reviewed: 12/20/2012  Exit[employer]® Patient Information ©2015 [employer]. This information is not intended to replace advice given to you by your health care provider. Make sure you discuss any questions you have with your health care provider.

## 2014-10-30 NOTE — Care Management Note (Signed)
Case Management Note  Patient Details  Name: [name] MRN: [id number] Date of Birth: 1934/07/20  Subjective/Objective:  CC44 given.Patient voiced understanding.                  Action/Plan:   Expected Discharge Date:   (unknown)               Expected Discharge Plan:  Assisted Living / Rest Home  In-House Referral:  Clinical Social Work  Discharge planning Services  CM Consult  Post Acute Care Choice:    Choice offered to:     DME Arranged:    DME Agency:     HH Arranged:    [place] Agency:     Status of Service:  Completed, signed off  Medicare Important Message Given:    Date Medicare IM Given:    Medicare IM give by:    Date Additional Medicare IM Given:    Additional Medicare Important Message give by:     If discussed at [place] of Stay Meetings, dates discussed:    Additional Comments:  Dessa Phi, RN 10/30/2014, 10:27 AM

## 2014-11-02 ENCOUNTER — Encounter: Payer: Self-pay | Admitting: Internal Medicine

## 2014-11-02 ENCOUNTER — Ambulatory Visit: Payer: Self-pay | Admitting: Internal Medicine

## 2014-11-04 DIAGNOSIS — H409 Unspecified glaucoma: Secondary | ICD-10-CM | POA: Diagnosis not present

## 2014-11-04 DIAGNOSIS — M1990 Unspecified osteoarthritis, unspecified site: Secondary | ICD-10-CM | POA: Diagnosis not present

## 2014-11-04 DIAGNOSIS — L97811 Non-pressure chronic ulcer of other part of right lower leg limited to breakdown of skin: Secondary | ICD-10-CM | POA: Diagnosis not present

## 2014-11-04 DIAGNOSIS — R601 Generalized edema: Secondary | ICD-10-CM | POA: Diagnosis not present

## 2014-11-04 DIAGNOSIS — I5032 Chronic diastolic (congestive) heart failure: Secondary | ICD-10-CM | POA: Diagnosis not present

## 2014-11-04 DIAGNOSIS — D631 Anemia in chronic kidney disease: Secondary | ICD-10-CM | POA: Diagnosis not present

## 2014-11-04 DIAGNOSIS — N179 Acute kidney failure, unspecified: Secondary | ICD-10-CM | POA: Diagnosis not present

## 2014-11-04 DIAGNOSIS — L97821 Non-pressure chronic ulcer of other part of left lower leg limited to breakdown of skin: Secondary | ICD-10-CM | POA: Diagnosis not present

## 2014-11-04 DIAGNOSIS — E039 Hypothyroidism, unspecified: Secondary | ICD-10-CM | POA: Diagnosis not present

## 2014-11-04 DIAGNOSIS — K5190 Ulcerative colitis, unspecified, without complications: Secondary | ICD-10-CM | POA: Diagnosis not present

## 2014-11-04 DIAGNOSIS — N189 Chronic kidney disease, unspecified: Secondary | ICD-10-CM | POA: Diagnosis not present

## 2014-11-04 DIAGNOSIS — M069 Rheumatoid arthritis, unspecified: Secondary | ICD-10-CM | POA: Diagnosis not present

## 2014-11-04 DIAGNOSIS — I129 Hypertensive chronic kidney disease with stage 1 through stage 4 chronic kidney disease, or unspecified chronic kidney disease: Secondary | ICD-10-CM | POA: Diagnosis not present

## 2014-11-05 ENCOUNTER — Telehealth: Payer: Self-pay

## 2014-11-05 NOTE — Telephone Encounter (Signed)
Noted  

## 2014-11-05 NOTE — Telephone Encounter (Signed)
Message left on triage voicemail, patient is currently at [facility] and would like to cancel any pending appointments with Dr.[name]. Patient will call once released from [facility] to rechedule.   Appointment for September 2016 canceled.   Message will be forwarded to PCP as a [place]

## 2014-11-11 DIAGNOSIS — L97821 Non-pressure chronic ulcer of other part of left lower leg limited to breakdown of skin: Secondary | ICD-10-CM | POA: Diagnosis not present

## 2014-11-18 ENCOUNTER — Encounter (HOSPITAL_BASED_OUTPATIENT_CLINIC_OR_DEPARTMENT_OTHER): Payer: Medicare Other | Attending: Surgery

## 2014-11-18 DIAGNOSIS — L97212 Non-pressure chronic ulcer of right calf with fat layer exposed: Secondary | ICD-10-CM | POA: Insufficient documentation

## 2014-11-18 DIAGNOSIS — L03116 Cellulitis of left lower limb: Secondary | ICD-10-CM | POA: Diagnosis not present

## 2014-11-18 DIAGNOSIS — X58XXXA Exposure to other specified factors, initial encounter: Secondary | ICD-10-CM | POA: Insufficient documentation

## 2014-11-18 DIAGNOSIS — I10 Essential (primary) hypertension: Secondary | ICD-10-CM | POA: Insufficient documentation

## 2014-11-18 DIAGNOSIS — M1990 Unspecified osteoarthritis, unspecified site: Secondary | ICD-10-CM | POA: Diagnosis not present

## 2014-11-18 DIAGNOSIS — S81811A Laceration without foreign body, right lower leg, initial encounter: Secondary | ICD-10-CM | POA: Insufficient documentation

## 2014-11-18 DIAGNOSIS — I890 Lymphedema, not elsewhere classified: Secondary | ICD-10-CM | POA: Diagnosis not present

## 2014-11-18 DIAGNOSIS — M069 Rheumatoid arthritis, unspecified: Secondary | ICD-10-CM | POA: Insufficient documentation

## 2014-11-18 DIAGNOSIS — F419 Anxiety disorder, unspecified: Secondary | ICD-10-CM | POA: Diagnosis not present

## 2014-11-18 DIAGNOSIS — F17218 Nicotine dependence, cigarettes, with other nicotine-induced disorders: Secondary | ICD-10-CM | POA: Diagnosis not present

## 2014-11-18 DIAGNOSIS — D649 Anemia, unspecified: Secondary | ICD-10-CM | POA: Diagnosis not present

## 2014-11-18 DIAGNOSIS — L97222 Non-pressure chronic ulcer of left calf with fat layer exposed: Secondary | ICD-10-CM | POA: Insufficient documentation

## 2014-11-25 DIAGNOSIS — L97222 Non-pressure chronic ulcer of left calf with fat layer exposed: Secondary | ICD-10-CM | POA: Diagnosis not present

## 2014-11-26 ENCOUNTER — Encounter: Payer: Self-pay | Admitting: Infectious Disease

## 2014-11-26 ENCOUNTER — Inpatient Hospital Stay: Payer: Medicare Other | Admitting: Internal Medicine

## 2014-11-26 ENCOUNTER — Ambulatory Visit (INDEPENDENT_AMBULATORY_CARE_PROVIDER_SITE_OTHER): Payer: Medicare Other | Admitting: Infectious Disease

## 2014-11-26 ENCOUNTER — Ambulatory Visit (HOSPITAL_COMMUNITY)
Admission: RE | Admit: 2014-11-26 | Discharge: 2014-11-26 | Disposition: A | Discharge status: Home / Self Care | Payer: Medicare Other | Source: Ambulatory Visit | Attending: Infectious Disease | Admitting: Infectious Disease

## 2014-11-26 VITALS — BP 130/73 | HR 70 | Temp 98.7°F | Wt 141.0 lb

## 2014-11-26 DIAGNOSIS — M069 Rheumatoid arthritis, unspecified: Secondary | ICD-10-CM

## 2014-11-26 DIAGNOSIS — Z96652 Presence of left artificial knee joint: Secondary | ICD-10-CM

## 2014-11-26 DIAGNOSIS — K5190 Ulcerative colitis, unspecified, without complications: Secondary | ICD-10-CM

## 2014-11-26 DIAGNOSIS — M7989 Other specified soft tissue disorders: Secondary | ICD-10-CM

## 2014-11-26 DIAGNOSIS — T8454XA Infection and inflammatory reaction due to internal left knee prosthesis, initial encounter: Secondary | ICD-10-CM | POA: Diagnosis not present

## 2014-11-26 HISTORY — DX: Other specified soft tissue disorders: M79.89

## 2014-11-26 LAB — BASIC METABOLIC PANEL
BUN: 27 mg/dL — AB (ref 7–25)
CHLORIDE: 103 mmol/L (ref 98–110)
CO2: 26 mmol/L (ref 20–31)
Calcium: 9.8 mg/dL (ref 8.6–10.4)
Creat: 0.92 mg/dL (ref 0.60–0.93)
Glucose, Bld: 94 mg/dL (ref 65–99)
POTASSIUM: 4.9 mmol/L (ref 3.5–5.3)
SODIUM: 142 mmol/L (ref 135–146)

## 2014-11-26 LAB — CBC WITH DIFFERENTIAL/PLATELET
BASOS ABS: 0.1 10*3/uL (ref 0.0–0.1)
BASOS PCT: 1 % (ref 0–1)
EOS PCT: 1 % (ref 0–5)
Eosinophils Absolute: 0.1 10*3/uL (ref 0.0–0.7)
HEMATOCRIT: 35 % — AB (ref 36.0–46.0)
Hemoglobin: 11.2 g/dL — ABNORMAL LOW (ref 12.0–15.0)
LYMPHS PCT: 22 % (ref 12–46)
Lymphs Abs: 1.6 10*3/uL (ref 0.7–4.0)
MCH: 29.4 pg (ref 26.0–34.0)
MCHC: 32 g/dL (ref 30.0–36.0)
MCV: 91.9 fL (ref 78.0–100.0)
MPV: 10.5 fL (ref 8.6–12.4)
Monocytes Absolute: 0.7 10*3/uL (ref 0.1–1.0)
Monocytes Relative: 10 % (ref 3–12)
NEUTROS ABS: 4.7 10*3/uL (ref 1.7–7.7)
Neutrophils Relative %: 66 % (ref 43–77)
Platelets: 280 10*3/uL (ref 150–400)
RBC: 3.81 MIL/uL — AB (ref 3.87–5.11)
RDW: 16.7 % — AB (ref 11.5–15.5)
WBC: 7.1 10*3/uL (ref 4.0–10.5)

## 2014-11-26 LAB — SEDIMENTATION RATE: Sed Rate: 14 mm/hr (ref 0–30)

## 2014-11-26 LAB — PROTIME-INR
INR: 1.86 — AB (ref 0.00–1.49)
Prothrombin Time: 21.4 seconds — ABNORMAL HIGH (ref 11.6–15.2)

## 2014-11-26 LAB — C-REACTIVE PROTEIN: CRP: <0.5 mg/dL (ref <0.60)

## 2014-11-26 MED ORDER — DOXYCYCLINE HYCLATE 100 MG PO TABS: 100.0000 mg | ORAL_TABLET | Freq: Two times a day (BID) | ORAL | Status: DC

## 2014-11-26 MED ORDER — CEPHALEXIN 500 MG PO CAPS: 1000.0000 mg | ORAL_CAPSULE | Freq: Two times a day (BID) | ORAL | Status: DC

## 2014-11-26 NOTE — Progress Notes (Signed)
Chief complaint: HSFU for infected prosthetic knee  Subjective:    Patient ID: [name], female    DOB: 1935/03/05, 80 y.o.   MRN: [id number]  HPI  80 y.o. female with hx of RA, UC, hypothyroidism, on humara (on hold), pred 5 mg daily, and celecoxib who underwent left TKA in early April 2016. Post operative did well initially with exception to repeated bouts of lowe extremity ulcers and cellulitis to left leg. She had cellulitis treated with a course of bactrim in June as an out patient, followed by having to be admitted in mid July for worsening cellulitis, treated initially with IV clindamycin then transitioned to orals for a total of 10 days. She suffered discomfort with swallowing associated with oral clindamycin (possible pill esophagitis) She had developed worsening left knee pain and swelling.she was seen in dr. Aurea Graff office initially for evaluation of her knee. She underwent arthrocentesis which revealed 24,000 WBC. due to recent bouts of cellulitis from a leg wound, there is the thought that she may have secondarily infected left total knee arthroplasty. she underwent an I&D and poly exchange with intent to treat as an early PJI with 6 weeks of IV antibiotics followed by oral antibiotic to finish 6 month course of treatment. Cultures from her knee joint never yielded an organisms and she has remained on IV vancomycin with significant improvement in her knee pain.  Past Medical History  Diagnosis Date  . Ulcerative colitis   . Hypothyroidism   . Hypertension     pcp   dr reed   [facility]  . Pneumonia 2008  . Chronic bronchitis 10/31/2011    "prone to it; I have sinus/bronchitis problem probably q yr"  . Multiple skin tears 10/31/2011    "get them very easily; my skin is thin and I bruise easily"  . Chronic back pain     "my entire spine"  . Edema   . Other and unspecified hyperlipidemia   . Encounter for long-term (current) use of other medications   . Senile  osteoporosis   . Spinal stenosis, unspecified region other than cervical   . Unspecified glaucoma   . Hypothyroidism   . Complication of anesthesia 2009    htn with block- for hand surgery   . Anxiety     sometimes   . Rheumatoid arthritis(714.0)     degenerative disc disease   . Swelling of right upper extremity 11/26/2014   Past Surgical History  Procedure Laterality Date  . Back surgery    . Sinus surgery with instatrak    . Thyroidectomy, partial  1997  . Posterior fusion lumbar spine  10/31/2011    L2-3; 3-4  . Tonsillectomy      "when I was a small child"  . Functional endoscopic sinus surgery  1988  . Posterior fusion lumbar spine  11/2009    L3-4;  L4-5  . Joint replacement  2009; 2006    joints 2 fingers,rt hand; joint left thumb  . Spine surgery    . Posterior lumbar fusion N/A 08/05/13    T1, T2  . Eye surgery    . Anterior cervical decomp/discectomy fusion N/A 02/17/2014    Procedure: CERVICAL FOUR TO FIVE ANTERIOR CERVICAL DECOMPRESSION/DISCECTOMY FUSION 1 LEVEL;  Surgeon: [name], MD;  Location: [facility];  Service: Neurosurgery;  Laterality: N/A;  C4-5 Anterior cervical decompression/diskectomy/fusion  . Cataract extraction Left 11/11/2013  . Cataract extraction Right 12/30/2013  . Total knee arthroplasty Left  06/16/2014    Procedure: TOTAL KNEE ARTHROPLASTY;  Surgeon: Paralee Cancel, MD;  Location: [facility];  Service: Orthopedics;  Laterality: Left;  . I&d knee with poly exchange Left 10/13/2014    Procedure: INCISION AND DRAINAGE LEFT TOTAL KNEE WITH POLY EXCHANGE;  Surgeon: Paralee Cancel, MD;  Location: [facility];  Service: Orthopedics;  Laterality: Left;   Family History  Problem Relation Age of Onset  . Heart disease Mother   . Cancer Father     lung   Social History  Substance Use Topics  . Smoking status: Passive Smoke Exposure - Never Smoker -- 0.25 packs/day for 50 years    Types: Cigarettes  . Smokeless tobacco: Current User     Comment: 10/31/2011   offered smoking cessation materials; pt declines; "I puff; I've never really inhaled"  . Alcohol Use: 2.4 oz/week    2 Glasses of wine, 2 Cans of beer per week     Comment: 10/31/2011 "1 beers/wine with dinner daily ; an occasional scotch"   No Known Allergies   Current outpatient prescriptions:  .  acetaminophen (TYLENOL) 325 MG tablet, Take 2 tablets (650 mg total) by mouth every 6 (six) hours as needed for mild pain (or Fever >/= 101)., Disp: , Rfl:  .  balsalazide (COLAZAL) 750 MG capsule, Take 2,250 mg by mouth 2 (two) times daily. , Disp: , Rfl:  .  Calcium Carb-Cholecalciferol (CALCIUM + D3) 600-200 MG-UNIT TABS, Take 1 tablet by mouth daily with breakfast. , Disp: , Rfl:  .  CELEBREX 200 MG capsule, Take 200 mg by mouth 2 (two) times daily. , Disp: , Rfl:  .  Cholecalciferol (VITAMIN D3) 1000 UNITS CAPS, Take 1,000 Units by mouth daily., Disp: , Rfl:  .  [employer] DIGESTIVE PROBIOTIC 250 MG capsule, Take 250 mg by mouth 2 (two) times daily., Disp: , Rfl: 0 .  docusate sodium (COLACE) 100 MG capsule, Take 1 capsule (100 mg total) by mouth 2 (two) times daily., Disp: 10 capsule, Rfl: 0 .  estradiol (ESTRACE) 0.5 MG tablet, Take 0.5 mg by mouth at bedtime. , Disp: , Rfl:  .  ferrous sulfate 325 (65 FE) MG tablet, Take 1 tablet (325 mg total) by mouth 3 (three) times daily after meals., Disp: , Rfl: 3 .  folic acid (FOLVITE) 1 MG tablet, Take 2 mg by mouth daily., Disp: , Rfl:  .  furosemide (LASIX) 40 MG tablet, Take 1 tablet (40 mg total) by mouth daily., Disp: 30 tablet, Rfl: 0 .  guaiFENesin (MUCINEX) 600 MG 12 hr tablet, Take 1,200 mg by mouth daily as needed (sinus relief). , Disp: , Rfl:  .  latanoprost (XALATAN) 0.005 % ophthalmic solution, Place 1 drop into both eyes at bedtime., Disp: , Rfl:  .  levothyroxine (SYNTHROID) 88 MCG tablet, Take 88 mcg by mouth daily before breakfast. DAW, DO NOT SUBSTITUTE WITH GENERIC, Disp: , Rfl:  .  medroxyPROGESTERone (PROVERA) 2.5 MG tablet, Take 2.5  mg by mouth at bedtime. , Disp: , Rfl:  .  metoprolol succinate (TOPROL-XL) 50 MG 24 hr tablet, Take 50 mg by mouth 2 (two) times daily. Take with or immediately following a meal., Disp: , Rfl:  .  Multiple Vitamin (MULTIVITAMIN WITH MINERALS) TABS, Take 1 tablet by mouth daily., Disp: , Rfl:  .  Omega-3 Fatty Acids (FISH OIL) 1200 MG CAPS, Take 1,200 mg by mouth daily., Disp: , Rfl:  .  polyethylene glycol (MIRALAX / GLYCOLAX) packet, Take 17 g by mouth  2 (two) times daily., Disp: 14 each, Rfl: 0 .  potassium chloride SA (K-DUR,KLOR-CON) 20 MEQ tablet, Take 20 mEq by mouth daily., Disp: , Rfl:  .  predniSONE (DELTASONE) 5 MG tablet, Take 5 mg by mouth daily with breakfast. , Disp: , Rfl:  .  simethicone (MYLICON) 80 MG chewable tablet, Chew 80 mg by mouth 3 (three) times daily as needed for flatulence., Disp: , Rfl:  .  tiZANidine (ZANAFLEX) 4 MG tablet, Take 1 tablet (4 mg total) by mouth every 6 (six) hours as needed for muscle spasms., Disp: 40 tablet, Rfl: 0 .  Vancomycin (VANCOCIN) 750 MG/150ML SOLN, Inject 150 mLs (750 mg total) into the vein every 12 (twelve) hours. (Patient taking differently: Inject 750 mg into the vein every 12 (twelve) hours. Takes for 6 weeks stop on 09/15), Disp: 12600 mL, Rfl: 0 .  warfarin (COUMADIN) 4 MG tablet, Take 4 mg by mouth every Tuesday, Thursday, Saturday, and Sunday at 6 PM., Disp: , Rfl:  .  warfarin (COUMADIN) 6 MG tablet, Take 6 mg by mouth every Monday, Wednesday, and Friday., Disp: , Rfl:  .  cephALEXin (KEFLEX) 500 MG capsule, Take 2 capsules (1,000 mg total) by mouth 2 (two) times daily., Disp: 120 capsule, Rfl: 5 .  doxycycline (VIBRA-TABS) 100 MG tablet, Take 1 tablet (100 mg total) by mouth 2 (two) times daily., Disp: 60 tablet, Rfl: 5    Review of Systems  Constitutional: Negative for fever, chills, diaphoresis, activity change, appetite change, fatigue and unexpected weight change.  HENT: Negative for congestion, rhinorrhea, sinus pressure,  sneezing, sore throat and trouble swallowing.   Eyes: Negative for photophobia and visual disturbance.  Respiratory: Negative for cough, chest tightness, shortness of breath, wheezing and stridor.   Cardiovascular: Negative for chest pain, palpitations and leg swelling.  Gastrointestinal: Negative for nausea, vomiting, abdominal pain, diarrhea, constipation, blood in stool, abdominal distention and anal bleeding.  Genitourinary: Negative for dysuria, hematuria, flank pain and difficulty urinating.  Musculoskeletal: Positive for myalgias and joint swelling. Negative for back pain, arthralgias and gait problem.  Skin: Positive for color change and wound. Negative for pallor and rash.  Neurological: Negative for dizziness, tremors, weakness and light-headedness.  Hematological: Negative for adenopathy. Does not bruise/bleed easily.  Psychiatric/Behavioral: Negative for behavioral problems, confusion, sleep disturbance, dysphoric mood, decreased concentration and agitation.       Objective:   Physical Exam  Constitutional: She is oriented to person, place, and time. No distress.  HENT:  Head: Normocephalic and atraumatic.  Mouth/Throat: No oropharyngeal exudate.  Eyes: Conjunctivae and EOM are normal. No scleral icterus.  Neck: Normal range of motion. Neck supple.  Cardiovascular: Normal rate and regular rhythm.   Pulmonary/Chest: Effort normal. No respiratory distress. She has no wheezes.  Abdominal: She exhibits no distension.  Musculoskeletal: She exhibits no edema or tenderness.  Neurological: She is alert and oriented to person, place, and time. She exhibits normal muscle tone. Coordination normal.  Skin: Skin is warm and dry. She is not diaphoretic.  Psychiatric: She has a normal mood and affect. Her behavior is normal. Judgment and thought content normal.    Left knee scar clean   Left shin with bandage where she has ulceration   After removal of PICC had signficicant swelling  in left arm     Assessment & Plan:   Prosthetic joint infection sp I&D and exchange sp IV vancomycin now approaching 6 weeks with improvement in condition. Since we DO NOT know the organisms  will switch her to oral doxy and keflex and continue this for several months, optimally 6 months minimum Recheck ESR, CRP   Left arm swelling: obtained stat doppler which was negative. Her INR was not therapeutic but will deer to PCP, follow the site  RA and UC: hold anti-TNF therapy while recovering from her infection over next half year   I spent greater than 40 minutes with the patient including greater than 50% of time in face to face counsel of the patient of her PJI, left arm swelling, RA and UC and in coordination of their care.

## 2014-11-26 NOTE — Progress Notes (Addendum)
Preliminary results by tech - Right Upper Ext. Venous Duplex Completed. Negative for deep and superficial vein thrombosis in the right arm. Results given to Dr. Tommy Medal. Velva Harman Shelisa Fern, BS, RDMS, RVT

## 2014-11-26 NOTE — Patient Instructions (Signed)
You can stop your vancomycin  I want you to start two antibiotics by mouth  Keflex 500mg  take TWO tablets TWICE daily  And  Doxycycline 100mg  po BID

## 2014-11-27 ENCOUNTER — Inpatient Hospital Stay: Payer: Medicare Other | Admitting: Internal Medicine

## 2014-11-27 LAB — HM DEXA SCAN

## 2014-12-02 DIAGNOSIS — L97222 Non-pressure chronic ulcer of left calf with fat layer exposed: Secondary | ICD-10-CM | POA: Diagnosis not present

## 2014-12-08 ENCOUNTER — Other Ambulatory Visit: Payer: Medicare Other

## 2014-12-08 DIAGNOSIS — I872 Venous insufficiency (chronic) (peripheral): Secondary | ICD-10-CM

## 2014-12-09 ENCOUNTER — Inpatient Hospital Stay: Payer: Medicare Other | Admitting: Infectious Disease

## 2014-12-09 DIAGNOSIS — L97222 Non-pressure chronic ulcer of left calf with fat layer exposed: Secondary | ICD-10-CM | POA: Diagnosis not present

## 2014-12-09 LAB — PROTIME-INR
INR: 2.9 — ABNORMAL HIGH (ref 0.8–1.2)
Prothrombin Time: 29.4 s — ABNORMAL HIGH (ref 9.1–12.0)

## 2014-12-10 ENCOUNTER — Encounter: Payer: Self-pay | Admitting: Internal Medicine

## 2014-12-10 ENCOUNTER — Ambulatory Visit: Payer: Medicare Other | Admitting: Internal Medicine

## 2014-12-10 ENCOUNTER — Ambulatory Visit (INDEPENDENT_AMBULATORY_CARE_PROVIDER_SITE_OTHER): Payer: Medicare Other | Admitting: Internal Medicine

## 2014-12-10 VITALS — BP 120/68 | HR 61 | Resp 14 | Ht 67.0 in | Wt 126.0 lb

## 2014-12-10 DIAGNOSIS — M4806 Spinal stenosis, lumbar region: Secondary | ICD-10-CM

## 2014-12-10 DIAGNOSIS — I480 Paroxysmal atrial fibrillation: Secondary | ICD-10-CM | POA: Diagnosis not present

## 2014-12-10 DIAGNOSIS — I8311 Varicose veins of right lower extremity with inflammation: Secondary | ICD-10-CM | POA: Diagnosis not present

## 2014-12-10 DIAGNOSIS — D509 Iron deficiency anemia, unspecified: Secondary | ICD-10-CM

## 2014-12-10 DIAGNOSIS — I872 Venous insufficiency (chronic) (peripheral): Secondary | ICD-10-CM

## 2014-12-10 DIAGNOSIS — Z7901 Long term (current) use of anticoagulants: Secondary | ICD-10-CM

## 2014-12-10 DIAGNOSIS — M069 Rheumatoid arthritis, unspecified: Secondary | ICD-10-CM

## 2014-12-10 DIAGNOSIS — M48062 Spinal stenosis, lumbar region with neurogenic claudication: Secondary | ICD-10-CM

## 2014-12-10 DIAGNOSIS — I10 Essential (primary) hypertension: Secondary | ICD-10-CM

## 2014-12-10 DIAGNOSIS — M4712 Other spondylosis with myelopathy, cervical region: Secondary | ICD-10-CM

## 2014-12-10 DIAGNOSIS — K5190 Ulcerative colitis, unspecified, without complications: Secondary | ICD-10-CM

## 2014-12-10 DIAGNOSIS — T8454XA Infection and inflammatory reaction due to internal left knee prosthesis, initial encounter: Secondary | ICD-10-CM

## 2014-12-10 DIAGNOSIS — M7501 Adhesive capsulitis of right shoulder: Secondary | ICD-10-CM | POA: Diagnosis not present

## 2014-12-10 DIAGNOSIS — Z5181 Encounter for therapeutic drug level monitoring: Secondary | ICD-10-CM

## 2014-12-10 DIAGNOSIS — E039 Hypothyroidism, unspecified: Secondary | ICD-10-CM | POA: Diagnosis not present

## 2014-12-10 DIAGNOSIS — I8312 Varicose veins of left lower extremity with inflammation: Secondary | ICD-10-CM

## 2014-12-10 MED ORDER — OXYCODONE HCL 5 MG PO TABS: 5.0000 mg | ORAL_TABLET | Freq: Two times a day (BID) | ORAL | Status: DC | PRN

## 2014-12-10 MED ORDER — METOPROLOL SUCCINATE ER 50 MG PO TB24: 50.0000 mg | ORAL_TABLET | Freq: Two times a day (BID) | ORAL | Status: DC

## 2014-12-10 MED ORDER — PREDNISONE 1 MG PO TABS: 3.0000 mg | ORAL_TABLET | Freq: Every day | ORAL | Status: DC

## 2014-12-10 MED ORDER — FERROUS SULFATE 325 (65 FE) MG PO TABS: 325.0000 mg | ORAL_TABLET | Freq: Every day | ORAL | Status: DC

## 2014-12-10 NOTE — Patient Instructions (Addendum)
Increase lasix back to 40mg  daily. Decrease iron to daily. Continue metoprolol and coumadin for atrial fibrillation Continue lower dose of prednisone  I referred you to OT outpatient for your right arm

## 2014-12-10 NOTE — Progress Notes (Signed)
Patient ID: [name], female   DOB: 1934/08/28, 80 y.o.   MRN: [id number]   Location:  [facility]  Code Status: full code Goals of Care: Advanced Directive information Does patient have an advance directive?: No, Would patient like information on creating an advanced directive?: Yes - Educational materials given (Patient still has paperwork at home )   Chief Complaint  Patient presents with  . Follow-up    Follow-up from 6 weeks of rehab at [facility]   . Medication Management    If patient is to continue Metoprolol need new RX 90 day supply. If patient is to continue prednisone needs rx   . Medication Refill    Probiotic refill   . Immunizations    Already had flu vaccine     HPI: Patient is a 80 y.o. white female seen in the office today for hospital and SNF followup.  She was discharged from [facility] 12/03/14 where she had rehab s/p hospitalization.  I reviewed pt's entire history of two hospitalizations in epic and the discharge summary from [facility].  10/05/14, pt was seen here after hospitalization for left leg cellulitis.  She completed her course of IV clindamycin and then followed up with Dr. Alvan Dame who had done her left TKA in April.  She was having more pain and swelling there and he got her admitted again 10/13/14, and she underwent debridement of the left infected TKA and polyexchange.  She was discharged 10/15/14 to [facility] on IV vanc for a 6 wk course.  On 8/18, she followed up with Dr. Alvan Dame and had significantly increased edema and swelling of her legs.  She was sent to the hospital again, diuresed and continued on the vanc.  She was also in acute renal failure so lasix was decreased to 40mg  daily from 40mg  po bid.  She then saw Dr. Drucilla Schmidt on 9/15 who recommended the 6 wks of iv abx that she completed already and converting then to a 6 month course of po doxycycline and keflex for the early infected joint.  Venous dopplers were done 9/15 that  were negative for DVT.  At some point while at [facility] she says they told her she may have been in afib, but they could not locate the EKG.  She was started on coumadin (on 4mg  daily at present) and metoprolol.  She was not educated about the afib or the coumadin at all so I gave her a starter course today.  She was discharged from [facility] 9/22, 7 days ago.  Will get [employer] involved to monitor the coumadin.  INR done in lab 2.9 on 12/08/14 here so 4mg  dose of coumadin was continued with recheck ordered for Monday, 10/3 with Joseph.    Per pt: Left leg cellulitis Seen Dr. Alvan Dame Had IV abx for days Had to go to [facility] for the IV bid  Dr. Sharlett Iles thought she had afib, she had an EKG, thinks maybe that's why she was on coumadin She says they did a heel scan at the facility through [facility].  Had [facility] home health referral, but says she was cleared to drive 2-3 wks ago.  Does need more PT.   Has lost a lot of use of the right arm.  Had overused when she broke the left clavicle.  Still cannot raise right arm despite exercises done at [facility] Knee doing beautifully now.   Saw Dr. Drucilla Schmidt who put her on oral antibiotics.  Stop dates  are in Oct are before her visit with him in mid-Nov.    Is taking prostat supplement.  Had been getting magic cups in SNF.  Using balsalazide for UC 2x per week instead of humira due to active infection.  Has had some periods of incredible diarrhea.     Is having difficulty sleeping.  Bed in facility was hard.  Using valium very occasionally as needed.  Back never stopped hurting since the surgery.  Had been taking 4 pain pills per day.  Now taking 1-2 per day.  Had been taking oxycodone not percocet.  Worked just as well for her. Rarely needs colace.  Is on iron tid.  Had been constipated at [facility]  Had been on lasix 40mg  but was going to the restroom frequently so taking 20mg . Says she will try to go back to 40mg  b/c swelling did return on lower  dose.  Had flu shot 12/03/14.   Dr. Leanna Battles was physician at [facility].  Review of Systems:  Review of Systems  Constitutional: Negative for fever and chills.  HENT: Negative for congestion.   Eyes: Negative for blurred vision.  Respiratory: Negative for shortness of breath.   Cardiovascular: Positive for leg swelling. Negative for chest pain and palpitations.  Gastrointestinal: Positive for diarrhea and constipation. Negative for abdominal pain, blood in stool and melena.  Genitourinary: Negative for dysuria.  Musculoskeletal: Positive for back pain, joint pain and neck pain. Negative for falls.       No falls since last appt  Skin:       Venous ulcers  Neurological: Negative for dizziness.  Endo/Heme/Allergies: Bruises/bleeds easily.  Psychiatric/Behavioral: Negative for depression and memory loss.    Past Medical History  Diagnosis Date  . Ulcerative colitis   . Hypothyroidism   . Hypertension     pcp   dr Consandra Laske   [facility]  . Pneumonia 2008  . Chronic bronchitis 10/31/2011    "prone to it; I have sinus/bronchitis problem probably q yr"  . Multiple skin tears 10/31/2011    "get them very easily; my skin is thin and I bruise easily"  . Chronic back pain     "my entire spine"  . Edema   . Other and unspecified hyperlipidemia   . Encounter for long-term (current) use of other medications   . Senile osteoporosis   . Spinal stenosis, unspecified region other than cervical   . Unspecified glaucoma   . Hypothyroidism   . Complication of anesthesia 2009    htn with block- for hand surgery   . Anxiety     sometimes   . Rheumatoid arthritis(714.0)     degenerative disc disease   . Swelling of right upper extremity 11/26/2014    Past Surgical History  Procedure Laterality Date  . Back surgery    . Sinus surgery with instatrak    . Thyroidectomy, partial  1997  . Posterior fusion lumbar spine  10/31/2011    L2-3; 3-4  . Tonsillectomy      "when I was a small  child"  . Functional endoscopic sinus surgery  1988  . Posterior fusion lumbar spine  11/2009    L3-4;  L4-5  . Joint replacement  2009; 2006    joints 2 fingers,rt hand; joint left thumb  . Spine surgery    . Posterior lumbar fusion N/A 08/05/13    T1, T2  . Eye surgery    . Anterior cervical decomp/discectomy fusion N/A 02/17/2014  Procedure: CERVICAL FOUR TO FIVE ANTERIOR CERVICAL DECOMPRESSION/DISCECTOMY FUSION 1 LEVEL;  Surgeon: [name], MD;  Location: [facility];  Service: Neurosurgery;  Laterality: N/A;  C4-5 Anterior cervical decompression/diskectomy/fusion  . Cataract extraction Left 11/11/2013  . Cataract extraction Right 12/30/2013  . Total knee arthroplasty Left 06/16/2014    Procedure: TOTAL KNEE ARTHROPLASTY;  Surgeon: Paralee Cancel, MD;  Location: [facility];  Service: Orthopedics;  Laterality: Left;  . I&d knee with poly exchange Left 10/13/2014    Procedure: INCISION AND DRAINAGE LEFT TOTAL KNEE WITH POLY EXCHANGE;  Surgeon: Paralee Cancel, MD;  Location: [facility];  Service: Orthopedics;  Laterality: Left;    No Known Allergies Medications: Patient's Medications  New Prescriptions   No medications on file  Previous Medications   AMINO ACIDS-PROTEIN HYDROLYS (FEEDING SUPPLEMENT, PRO-STAT SUGAR FREE 64,) LIQD    Take 30 mLs by mouth 2 (two) times daily.   BALSALAZIDE (COLAZAL) 750 MG CAPSULE    Take 2,250 mg by mouth 2 (two) times daily.    CALCIUM CARB-CHOLECALCIFEROL (CALCIUM + D3) 600-200 MG-UNIT TABS    Take 1 tablet by mouth daily with breakfast.    CELEBREX 200 MG CAPSULE    Take 200 mg by mouth 2 (two) times daily.    CEPHALEXIN (KEFLEX) 500 MG CAPSULE    Take 2 capsules (1,000 mg total) by mouth 2 (two) times daily.   CHOLECALCIFEROL (VITAMIN D3) 1000 UNITS CAPS    Take 1,000 Units by mouth daily.   DIAZEPAM (VALIUM) 5 MG TABLET    Take 5 mg by mouth every 6 (six) hours as needed for muscle spasms.   DOCUSATE SODIUM (COLACE) 100 MG CAPSULE    Take 100 mg by mouth  daily as needed for mild constipation.   DOXYCYCLINE (VIBRA-TABS) 100 MG TABLET    Take 1 tablet (100 mg total) by mouth 2 (two) times daily.   ESTRADIOL (ESTRACE) 0.5 MG TABLET    Take 0.5 mg by mouth at bedtime.    FERROUS SULFATE 325 (65 FE) MG TABLET    Take 1 tablet (325 mg total) by mouth 3 (three) times daily after meals.   FOLIC ACID (FOLVITE) 1 MG TABLET    Take 2 mg by mouth daily.   FUROSEMIDE (LASIX) 20 MG TABLET    Take 20 mg by mouth daily.   GUAIFENESIN (MUCINEX) 600 MG 12 HR TABLET    Take 1,200 mg by mouth daily as needed (sinus relief).    LATANOPROST (XALATAN) 0.005 % OPHTHALMIC SOLUTION    Place 1 drop into both eyes at bedtime.   LEVOTHYROXINE (SYNTHROID) 88 MCG TABLET    Take 88 mcg by mouth daily before breakfast. DAW, DO NOT SUBSTITUTE WITH GENERIC   MEDROXYPROGESTERONE (PROVERA) 2.5 MG TABLET    Take 2.5 mg by mouth at bedtime.    METOPROLOL SUCCINATE (TOPROL-XL) 50 MG 24 HR TABLET    Take 50 mg by mouth 2 (two) times daily. Take with or immediately following a meal.   MULTIPLE VITAMIN (MULTIVITAMIN WITH MINERALS) TABS    Take 1 tablet by mouth daily.   OMEGA-3 FATTY ACIDS (FISH OIL) 1200 MG CAPS    Take 1,200 mg by mouth daily.   OXYCODONE (OXY IR/ROXICODONE) 5 MG IMMEDIATE RELEASE TABLET    1 by mouth every 4 hours as needed for moderate pain, 2 by mouth as needed for severe pain   POLYETHYLENE GLYCOL (MIRALAX / GLYCOLAX) PACKET    Take 17 g by mouth 2 (two)  times daily.   POTASSIUM CHLORIDE SA (K-DUR,KLOR-CON) 20 MEQ TABLET    Take 20 mEq by mouth daily.   PREDNISONE (DELTASONE) 1 MG TABLET    3 by mouth daily with breakfast   PROBIOTIC PRODUCT (ALIGN) 4 MG CAPS    Take by mouth daily.   WARFARIN (COUMADIN) 4 MG TABLET    1 by mouth daily  Modified Medications   No medications on file  Discontinued Medications   ACETAMINOPHEN (TYLENOL) 325 MG TABLET    Take 2 tablets (650 mg total) by mouth every 6 (six) hours as needed for mild pain (or Fever >/= 101).   [employer]  DIGESTIVE PROBIOTIC 250 MG CAPSULE    Take 250 mg by mouth 2 (two) times daily.   DOCUSATE SODIUM (COLACE) 100 MG CAPSULE    Take 1 capsule (100 mg total) by mouth 2 (two) times daily.   FUROSEMIDE (LASIX) 40 MG TABLET    Take 1 tablet (40 mg total) by mouth daily.   PREDNISONE (DELTASONE) 5 MG TABLET    Take 5 mg by mouth daily with breakfast.    SIMETHICONE (MYLICON) 80 MG CHEWABLE TABLET    Chew 80 mg by mouth 3 (three) times daily as needed for flatulence.   TIZANIDINE (ZANAFLEX) 4 MG TABLET    Take 1 tablet (4 mg total) by mouth every 6 (six) hours as needed for muscle spasms.   VANCOMYCIN (VANCOCIN) 750 MG/150ML SOLN    Inject 150 mLs (750 mg total) into the vein every 12 (twelve) hours.   WARFARIN (COUMADIN) 6 MG TABLET    Take 6 mg by mouth every Monday, Wednesday, and Friday.    Physical Exam: Filed Vitals:   12/10/14 0847  BP: 120/68  Pulse: 61  Resp: 14  Height: 5\' 7"  (1.702 m)  Weight: 126 lb (57.153 kg)  SpO2: 99%   Physical Exam  Constitutional:  Thin, chronically ill appearing female  Cardiovascular: Normal rate, regular rhythm and normal heart sounds.   Regular today  Pulmonary/Chest: Effort normal and breath sounds normal. No respiratory distress. She has no wheezes. She has no rales.  Abdominal: Bowel sounds are normal.  Musculoskeletal:  Walks with cane, limps  Neurological: She is alert.  Skin:  Wearing compression hose, still has edema of bilateral legs (self-reduced lasix to 20mg  again due to urinating frequently)  Psychiatric: She has a normal mood and affect.    Labs reviewed: Basic Metabolic Panel:  Recent Labs  03/23/14 0854  08/24/14 1119  10/29/14 1903 10/30/14 0422 11/26/14 1528  NA 141  < > 143  < > 140 141 142  K 3.7  < > 4.0  < > 3.7 3.9 4.9  CL 100  < > 101  < > 105 105 103  CO2 26  < > 25  < > 30 28 26   GLUCOSE 74  < > 102*  < > 93 92 94  BUN 22  < > 26  < > 19 24* 27*  CREATININE 0.88  < > 0.87  < > 1.14* 1.58* 0.92  CALCIUM 9.4   < > 9.3  < > 8.5* 8.3* 9.8  MG  --   --   --   --  2.3  --   --   PHOS  --   --   --   --  3.7  --   --   TSH 2.180  --  0.457  --  0.851  --   --   < > =  values in this interval not displayed. Liver Function Tests:  Recent Labs  08/24/14 1119 10/29/14 1903 10/30/14 0422  AST 28 23 21   ALT 12 11* 10*  ALKPHOS 156* 131* 118  BILITOT 0.3 0.7 0.7  PROT 6.1 5.6* 4.9*  ALBUMIN  --  3.0* 2.8*   No results for input(s): LIPASE, AMYLASE in the last 8760 hours. No results for input(s): AMMONIA in the last 8760 hours. CBC:  Recent Labs  08/24/14 1119  10/29/14 1903 10/30/14 0422 11/26/14 1528  WBC 7.5  < > 8.8 6.8 7.1  NEUTROABS 6.1  --  6.8  --  4.7  HGB  --   < > 8.6* 8.6* 11.2*  HCT 37.8  < > 28.0* 27.1* 35.0*  MCV  --   < > 92.1 93.4 91.9  PLT  --   < > 284 288 280  < > = values in this interval not displayed. Lipid Panel:  Recent Labs  03/23/14 0854  CHOL 239*  HDL 86  LDLCALC 130*  TRIG 115  CHOLHDL 2.8   No results found for: HGBA1C  Procedures since last visit: Echo and other hospital procedures reviewed.  Assessment/Plan 1. Paroxysmal atrial fibrillation -will plan to check EKG next visit b/c d/c summary from [facility] does not list this or any other reason for the coumadin and metoprolol and today seems to be back in sinus  2. Anticoagulation goal of INR 2 to 3 -cont coumadin 4mg  daily with recheck on Monday 10/3 -educated briefly today on coumadin, greens, effects of antibiotics, steroids and many other meds on thinness of blood -will have her follow with Tivis Ringer for coumadin mgt   3. Benign essential HTN -bp is well controlled on current therapy, not hypotensive  4. Ulcerative colitis, chronic, without complications -cont lower dose of prednisone until wounds have healed on legs (cont to follow with wound care center on these) -cont balsamazide--has not been having loose stools since home  5. Hypothyroidism, unspecified hypothyroidism  type -cont levothyroxine current dose, tsh has been stable  6. Cervical spondylosis with myelopathy -has chronic pain -continues on low dose prednisone -had prior surgery  7. Rheumatoid arthritis -humira on hold due to her infections and on low dose prednisone  -cont oxycodone 5mg  po bid prn pain--60 pills ordered today  8. Infection of prosthetic left knee joint -continues on doxycycline and keflex from 11/26/14 for a 6 month course as per Dr. Arlyss Queen recommendation -keep ID f/u   9. Chronic venous stasis dermatitis of both lower extremities -cont use of compression hose -due to her diastolic chf and worsened edema, resume lasix 40mg  daily as was ordered instead of taking 20mg  -discussed that it's a balance between edema and urinary frequency in her case -elevate feet at rest  10. Lumbar stenosis with neurogenic claudication -with chronic pain, has never gotten ride of the pain since the surgery, and sitting and lying more has made it worse -cont current regimen  11. Anemia, iron deficiency -decrease iron to daily to prevent constipation -plan to reassess cbc at next visit if she has not just had one by another provider at that point  12. Adhesive capsulitis of right shoulder - request outpatient therapy to work on her right shoulder for increased range of motion - Ambulatory referral to Occupational Therapy  Labs/tests ordered:   Orders Placed This Encounter  Procedures  . Ambulatory referral to Occupational Therapy    Referral Priority:  Routine    Referral Type:  Occupational  Therapy    Referral Reason:  Specialty Services Required    Requested Specialty:  Occupational Therapy    Number of Visits Requested:  1    Next appt:  1 month med mgt; 10/3 for INR with Gardner. Suhaib Guzzo, D.O. [facility] [address], [place] [address] Cell Phone (Mon-Fri 8am-5pm):  [phone] On Call:  [phone] & follow  prompts after 5pm & weekends Office Phone:  [phone] Office Fax:  [phone]

## 2014-12-14 ENCOUNTER — Ambulatory Visit (INDEPENDENT_AMBULATORY_CARE_PROVIDER_SITE_OTHER): Payer: Medicare Other | Admitting: Pharmacotherapy

## 2014-12-14 ENCOUNTER — Other Ambulatory Visit: Payer: Self-pay | Admitting: *Deleted

## 2014-12-14 ENCOUNTER — Encounter: Payer: Self-pay | Admitting: Pharmacotherapy

## 2014-12-14 VITALS — BP 120/60 | HR 51 | Temp 97.6°F | Resp 20 | Ht 67.0 in | Wt 131.0 lb

## 2014-12-14 DIAGNOSIS — I4891 Unspecified atrial fibrillation: Secondary | ICD-10-CM | POA: Insufficient documentation

## 2014-12-14 DIAGNOSIS — I509 Heart failure, unspecified: Secondary | ICD-10-CM | POA: Diagnosis not present

## 2014-12-14 DIAGNOSIS — I480 Paroxysmal atrial fibrillation: Secondary | ICD-10-CM

## 2014-12-14 DIAGNOSIS — Z7901 Long term (current) use of anticoagulants: Secondary | ICD-10-CM | POA: Diagnosis not present

## 2014-12-14 DIAGNOSIS — Z5181 Encounter for therapeutic drug level monitoring: Secondary | ICD-10-CM

## 2014-12-14 LAB — POCT INR: INR: 3.4

## 2014-12-14 MED ORDER — WARFARIN SODIUM 4 MG PO TABS: ORAL_TABLET | ORAL | Status: DC

## 2014-12-14 NOTE — Patient Instructions (Signed)
INR 3.4 (goal 2-3) Decrease Coumadin 4mg  daily except 2mg  (1/2 tablet) on Monday

## 2014-12-14 NOTE — Progress Notes (Signed)
   Subjective:    Patient ID: [name], female    DOB: 05/07/1934, 80 y.o.   MRN: [id number]  HPI  Currently on Coumadin 4mg  daily for AF Denies missed doses Denies unusual bleeding or bruising. Denies CP, palpitations, falls Consistent with vitamin K  Concerned about 10 pound weight loss    Review of Systems  Constitutional: Positive for appetite change, fatigue and unexpected weight change. Negative for fever.  HENT: Negative for nosebleeds.   Cardiovascular: Negative for chest pain and palpitations.  Gastrointestinal: Negative for anal bleeding.  Genitourinary: Negative for hematuria.  Hematological: Does not bruise/bleed easily.       Objective:   Physical Exam  Constitutional: She is oriented to person, place, and time. She appears well-developed and well-nourished.  HENT:  Right Ear: External ear normal.  Left Ear: External ear normal.  Cardiovascular: Normal rate, regular rhythm and normal heart sounds.   Pulmonary/Chest: Effort normal and breath sounds normal.  Neurological: She is alert and oriented to person, place, and time.  Skin: Skin is warm and dry.  Psychiatric: She has a normal mood and affect. Her behavior is normal. Judgment and thought content normal.     BP:  120/60  HR:  51  Wt:  131lb INR 3.4     Assessment & Plan:  1.  INR above target 2-3 2.  Decrease Coumadin 4mg  QD except 2mg  on Mondays 3.  INR 2 weeks

## 2014-12-14 NOTE — Telephone Encounter (Signed)
Patient requested refill, faxed to pharmacy.

## 2014-12-15 ENCOUNTER — Ambulatory Visit (INDEPENDENT_AMBULATORY_CARE_PROVIDER_SITE_OTHER): Payer: Medicare Other | Admitting: Podiatry

## 2014-12-15 ENCOUNTER — Encounter: Payer: Self-pay | Admitting: Podiatry

## 2014-12-15 DIAGNOSIS — M79609 Pain in unspecified limb: Principal | ICD-10-CM

## 2014-12-15 DIAGNOSIS — B351 Tinea unguium: Secondary | ICD-10-CM

## 2014-12-15 DIAGNOSIS — Q828 Other specified congenital malformations of skin: Secondary | ICD-10-CM

## 2014-12-15 DIAGNOSIS — M79673 Pain in unspecified foot: Secondary | ICD-10-CM | POA: Diagnosis not present

## 2014-12-15 LAB — CBC
Hematocrit: 36.5 % (ref 34.0–46.6)
Hemoglobin: 11.9 g/dL (ref 11.1–15.9)
MCH: 28.8 pg (ref 26.6–33.0)
MCHC: 32.6 g/dL (ref 31.5–35.7)
MCV: 88 fL (ref 79–97)
Platelets: 263 10*3/uL (ref 150–379)
RBC: 4.13 x10E6/uL (ref 3.77–5.28)
RDW: 15.2 % (ref 12.3–15.4)
WBC: 6 10*3/uL (ref 3.4–10.8)

## 2014-12-15 LAB — COMPREHENSIVE METABOLIC PANEL
ALT: 13 IU/L (ref 0–32)
AST: 37 IU/L (ref 0–40)
Albumin/Globulin Ratio: 2 (ref 1.1–2.5)
Albumin: 4 g/dL (ref 3.5–4.8)
Alkaline Phosphatase: 118 IU/L — ABNORMAL HIGH (ref 39–117)
BUN/Creatinine Ratio: 45 — ABNORMAL HIGH (ref 11–26)
BUN: 41 mg/dL — ABNORMAL HIGH (ref 8–27)
Bilirubin Total: 0.4 mg/dL (ref 0.0–1.2)
CO2: 28 mmol/L (ref 18–29)
Calcium: 9.4 mg/dL (ref 8.7–10.3)
Chloride: 96 mmol/L — ABNORMAL LOW (ref 97–108)
Creatinine, Ser: 0.92 mg/dL (ref 0.57–1.00)
GFR calc Af Amer: 69 mL/min/{1.73_m2} (ref >59)
GFR calc non Af Amer: 60 mL/min/{1.73_m2} (ref >59)
Globulin, Total: 2 g/dL (ref 1.5–4.5)
Glucose: 96 mg/dL (ref 65–99)
Potassium: 3.2 mmol/L — ABNORMAL LOW (ref 3.5–5.2)
Sodium: 140 mmol/L (ref 134–144)
Total Protein: 6 g/dL (ref 6.0–8.5)

## 2014-12-15 LAB — PREALBUMIN: PREALBUMIN: 25 mg/dL (ref 9–32)

## 2014-12-15 NOTE — Progress Notes (Signed)
Patient ID: [name], female   DOB: [birth year], 80 y.o.   MRN: [id number] Complaint:  Visit Type: Patient returns to my office for continued preventative foot care services. Complaint: Patient states" my nails have grown long and thick and become painful to walk and wear shoes" . The patient presents for preventative foot care services. No changes to ROS.  Patient is under care for multiple ulcers on her left lower leg being treated by wound care.  Podiatric Exam: Vascular: dorsalis pedis and posterior tibial pulses are palpable bilateral. Capillary return is immediate. Temperature gradient is WNL. Skin turgor WNL  Sensorium: Normal Semmes Weinstein monofilament test. Normal tactile sensation bilaterally. Nail Exam: Pt has thick disfigured discolored nails with subungual debris noted bilateral entire nail hallux through fifth toenails Ulcer Exam: There is no evidence of ulcer or pre-ulcerative changes or infection. Orthopedic Exam: Muscle tone and strength are WNL. No limitations in general ROM. No crepitus or effusions noted. Foot type and digits show no abnormalities. Bony prominences are unremarkable.HAV 1st MPJ B/LShe has arthritis 1st Metabase cuneiform right foot. Skin: No Porokeratosis. No infection or ulcers.Porokeratosis sub3 left foot  Diagnosis:  Onychomycosis, , Pain in right toe, pain in left toes  Treatment & Plan Procedures and Treatment: Consent by patient was obtained for treatment procedures. The patient understood the discussion of treatment and procedures well. All questions were answered thoroughly reviewed. Debridement of mycotic and hypertrophic toenails, 1 through 5 bilateral and clearing of subungual debris. No ulceration, no infection noted.  Return Visit-Office Procedure: Patient instructed to return to the office for a follow up visit 3 months for continued evaluation and treatment.

## 2014-12-16 ENCOUNTER — Encounter (HOSPITAL_BASED_OUTPATIENT_CLINIC_OR_DEPARTMENT_OTHER): Payer: Medicare Other | Attending: Surgery

## 2014-12-16 DIAGNOSIS — H409 Unspecified glaucoma: Secondary | ICD-10-CM | POA: Insufficient documentation

## 2014-12-16 DIAGNOSIS — L97821 Non-pressure chronic ulcer of other part of left lower leg limited to breakdown of skin: Secondary | ICD-10-CM | POA: Insufficient documentation

## 2014-12-16 DIAGNOSIS — M1990 Unspecified osteoarthritis, unspecified site: Secondary | ICD-10-CM | POA: Insufficient documentation

## 2014-12-16 DIAGNOSIS — Z87891 Personal history of nicotine dependence: Secondary | ICD-10-CM | POA: Insufficient documentation

## 2014-12-16 DIAGNOSIS — I10 Essential (primary) hypertension: Secondary | ICD-10-CM | POA: Diagnosis not present

## 2014-12-16 DIAGNOSIS — M069 Rheumatoid arthritis, unspecified: Secondary | ICD-10-CM | POA: Diagnosis not present

## 2014-12-23 DIAGNOSIS — L97821 Non-pressure chronic ulcer of other part of left lower leg limited to breakdown of skin: Secondary | ICD-10-CM | POA: Diagnosis not present

## 2014-12-25 ENCOUNTER — Telehealth: Payer: Self-pay | Admitting: *Deleted

## 2014-12-25 ENCOUNTER — Ambulatory Visit (INDEPENDENT_AMBULATORY_CARE_PROVIDER_SITE_OTHER): Payer: Medicare Other | Admitting: Internal Medicine

## 2014-12-25 ENCOUNTER — Encounter: Payer: Self-pay | Admitting: Internal Medicine

## 2014-12-25 VITALS — BP 118/72 | HR 63 | Temp 98.4°F | Resp 18 | Ht 67.0 in | Wt 131.8 lb

## 2014-12-25 DIAGNOSIS — R1084 Generalized abdominal pain: Secondary | ICD-10-CM

## 2014-12-25 DIAGNOSIS — K5190 Ulcerative colitis, unspecified, without complications: Secondary | ICD-10-CM

## 2014-12-25 DIAGNOSIS — Z7901 Long term (current) use of anticoagulants: Secondary | ICD-10-CM

## 2014-12-25 DIAGNOSIS — Z7952 Long term (current) use of systemic steroids: Secondary | ICD-10-CM | POA: Diagnosis not present

## 2014-12-25 DIAGNOSIS — T8454XA Infection and inflammatory reaction due to internal left knee prosthesis, initial encounter: Secondary | ICD-10-CM | POA: Diagnosis not present

## 2014-12-25 DIAGNOSIS — R197 Diarrhea, unspecified: Secondary | ICD-10-CM

## 2014-12-25 DIAGNOSIS — A09 Infectious gastroenteritis and colitis, unspecified: Secondary | ICD-10-CM | POA: Diagnosis not present

## 2014-12-25 MED ORDER — METRONIDAZOLE 500 MG PO TABS: 500.0000 mg | ORAL_TABLET | Freq: Two times a day (BID) | ORAL | Status: DC

## 2014-12-25 NOTE — Progress Notes (Signed)
Patient ID: [name], female   DOB: 1934/11/23, 80 y.o.   MRN: [id number]    Location:    [facility]   Place of Service:   OFFICE  Chief Complaint  Patient presents with  . Acute Visit    diarrhea x 3 days, painful, violent,gaseous,cramping    HPI:  80 yo female seen today for diarrhea and abdominal pain that began 3 days ago. She reports stools are black. She  has been having explosive diarrhea. abdominal pain is cramping. She has chronic chills. No fever. No N/V. She takes colazal for [place] and on chronic prednisone for RA. She was previously taking humira prior to infected knee hardware. Med was stopped once infection dx. She received IV vanco x 6 weeks while in [place] and is currently taking po keflex 1010m BID and doxy 1044mBID (since 9/15th and projected stop date Oct 27th ) . She has f/u appt with Ortho next week.  Past Medical History  Diagnosis Date  . [facility]  . Hypothyroidism   . Hypertension     pcp   dr reed   [facility]  . Pneumonia 2008  . [place] bronch[place]    "prone to it; I have sinus/bronchitis problem probably q yr"  . Multiple skin tears 10/31/2011    "get them very easily; my skin is thin and I bruise easily"  . Chronic back pain     "my entire spine"  . Edema   . Other and unspecified hyperlipidemia   . Encounter for long-term (current) use of other medications   . Senile osteoporosis   . Spinal stenosis, unspecified region other than cervical   . Unspecified glaucoma   . Hypothyroidism   . Complication of anesthesia 2009    htn with block- for hand surgery   . Anxiety     sometimes   . Rheumatoid arthritis(714.0)     degenerative disc disease   . Swelling of right upper extremity 11/26/2014    Past Surgical History  Procedure Laterality Date  . Back surgery    . Sinus surgery with instatrak    . Thyroidectomy, partial  1997  . Posterior fusion lumbar spine  10/31/2011    L2-3; 3-4  . Tonsillectomy     "when I was a small child"  . Functional endoscopic sinus surgery  1988  . Posterior fusion lumbar spine  11/2009    L3-4;  L4-5  . Joint replacement  2009; 2006    joints 2 fingers,rt hand; joint left thumb  . Spine surgery    . Posterior lumbar fusion N/A 08/05/13    T1, T2  . Eye surgery    . Anterior cervical decomp/discectomy fusion N/A 02/17/2014    Procedure: CERVICAL FOUR TO FIVE ANTERIOR CERVICAL DECOMPRESSION/DISCECTOMY FUSION 1 LEVEL;  Surgeon: HeKristeen MissMD;  [facility];  Service: Neurosurgery;  Laterality: N/A;  C4-5 Anterior cervical decompression/diskectomy/fusion  . Cataract extraction Left 11/11/2013  . Cataract extraction Right 12/30/2013  . Total knee arthroplasty Left 06/16/2014    Procedure: TOTAL KNEE ARTHROPLASTY;  Surgeon: MaParalee CancelMD;  Location: [facility];  Service: Orthopedics;  Laterality: Left;  . I&d knee with poly exchange Left 10/13/2014    Procedure: INCISION AND DRAINAGE LEFT TOTAL KNEE WITH POLY EXCHANGE;  Surgeon: MaParalee CancelMD;  Location: [facility];  Service: Orthopedics;  Laterality: Left;    Patient Care Team: TiGayland CurryDO as PCP - General (Geriatric Medicine)  Hennie Duos, MD as Consulting Physician (Rheumatology) Paralee Cancel, MD as Consulting Physician (Orthopedic Surgery) Gean Birchwood, DPM as Consulting Physician (Podiatry) [name], MD as Consulting Physician (Neurosurgery)  Social History   Social History  . Marital Status: Married    Spouse Name: N/A  . Number of Children: N/A  . Years of Education: N/A   Occupational History  . Not on file.   Social History Main Topics  . Smoking status: Passive Smoke Exposure - Never Smoker -- 0.25 packs/day for 50 years    Types: Cigarettes  . Smokeless tobacco: Never Used     Comment: 10/31/2011  offered smoking cessation materials; pt declines; "I puff; I've never really inhaled"  . Alcohol Use: 2.4 oz/week    2 Glasses of wine, 2 Cans of beer per week      Comment: 10/31/2011 "1 beers/wine with dinner daily ; an occasional scotch"  . Drug Use: No  . Sexual Activity: Not Currently   Other Topics Concern  . Not on file   Social History Narrative     reports that she has been passively smoking Cigarettes.  She has a 12.5 pack-year smoking history. She has never used smokeless tobacco. She reports that she drinks about 2.4 oz of alcohol per week. She reports that she does not use illicit drugs.  No Known Allergies  Medications: Patient's Medications  New Prescriptions   No medications on file  Previous Medications   AMINO ACIDS-PROTEIN HYDROLYS (FEEDING SUPPLEMENT, PRO-STAT SUGAR FREE 64,) LIQD    Take 30 mLs by mouth 2 (two) times daily.   BALSALAZIDE (COLAZAL) 750 MG CAPSULE    Take 2,250 mg by mouth 2 (two) times daily.    CALCIUM CARB-CHOLECALCIFEROL (CALCIUM + D3) 600-200 MG-UNIT TABS    Take 1 tablet by mouth daily with breakfast.    CELEBREX 200 MG CAPSULE    Take 200 mg by mouth 2 (two) times daily.    CEPHALEXIN (KEFLEX) 500 MG CAPSULE    Take 2 capsules (1,000 mg total) by mouth 2 (two) times daily.   CHOLECALCIFEROL (VITAMIN D3) 1000 UNITS CAPS    Take 1,000 Units by mouth daily.   DIAZEPAM (VALIUM) 5 MG TABLET    Take 5 mg by mouth every 6 (six) hours as needed for muscle spasms.   DOCUSATE SODIUM (COLACE) 100 MG CAPSULE    Take 100 mg by mouth daily as needed for mild constipation.   DOXYCYCLINE (VIBRA-TABS) 100 MG TABLET    Take 1 tablet (100 mg total) by mouth 2 (two) times daily.   ESTRADIOL (ESTRACE) 0.5 MG TABLET    Take 0.5 mg by mouth at bedtime.    FERROUS SULFATE 325 (65 FE) MG TABLET    Take 1 tablet (325 mg total) by mouth daily with breakfast.   FOLIC ACID (FOLVITE) 1 MG TABLET    Take 2 mg by mouth daily.   FUROSEMIDE (LASIX) 20 MG TABLET    Take 40 mg by mouth daily.   GUAIFENESIN (MUCINEX) 600 MG 12 HR TABLET    Take 1,200 mg by mouth daily as needed (sinus relief).    LATANOPROST (XALATAN) 0.005 % OPHTHALMIC  SOLUTION    Place 1 drop into both eyes at bedtime.   LEVOTHYROXINE (SYNTHROID) 88 MCG TABLET    Take 88 mcg by mouth daily before breakfast. DAW, DO NOT SUBSTITUTE WITH GENERIC   MEDROXYPROGESTERONE (PROVERA) 2.5 MG TABLET    Take 2.5 mg by mouth at bedtime.  METOPROLOL SUCCINATE (TOPROL-XL) 50 MG 24 HR TABLET    Take 1 tablet (50 mg total) by mouth 2 (two) times daily. Take with or immediately following a meal.   MULTIPLE VITAMIN (MULTIVITAMIN WITH MINERALS) TABS    Take 1 tablet by mouth daily.   OXYCODONE (OXY IR/ROXICODONE) 5 MG IMMEDIATE RELEASE TABLET    Take 1 tablet (5 mg total) by mouth 2 (two) times daily as needed for severe pain. 1 by mouth every 4 hours as needed for moderate pain, 2 by mouth as needed for severe pain   OXYCODONE-ACETAMINOPHEN (PERCOCET/ROXICET) 5-325 MG TABLET    Take 1 tablet by mouth every 6 (six) hours as needed. for pain   POLYETHYLENE GLYCOL (MIRALAX / GLYCOLAX) PACKET    Take 17 g by mouth 2 (two) times daily.   POTASSIUM CHLORIDE SA (K-DUR,KLOR-CON) 20 MEQ TABLET    Take 20 mEq by mouth daily.   PREDNISONE (DELTASONE) 1 MG TABLET    Take 3 tablets (3 mg total) by mouth daily with breakfast.   WARFARIN (COUMADIN) 4 MG TABLET    Take one tablet by mouth once daily except take 67m on Monday  Modified Medications   No medications on file  Discontinued Medications   OMEGA-3 FATTY ACIDS (FISH OIL) 1200 MG CAPS    Take 1,200 mg by mouth daily.   PROBIOTIC PRODUCT (ALIGN) 4 MG CAPS    Take 4 mg by mouth daily.     Review of Systems  Constitutional: Positive for chills. Negative for fever and appetite change.  HENT: Negative for trouble swallowing.   Gastrointestinal: Positive for abdominal pain, diarrhea and abdominal distention. Negative for nausea and vomiting.  Hematological: Bruises/bleeds easily.  Psychiatric/Behavioral: Negative for sleep disturbance.    Filed Vitals:   12/25/14 1307  BP: 118/72  Pulse: 63  Temp: 98.4 F (36.9 C)  TempSrc: Oral    Resp: 18  Height: '5\' 7"'  (1.702 m)  Weight: 131 lb 12.8 oz (59.784 kg)  SpO2: 98%   Body mass index is 20.64 kg/(m^2).  Physical Exam  Constitutional: She is oriented to person, place, and time. No distress.  Frail appearing in NAD  HENT:  Mouth/Throat: Oropharynx is clear and moist.  MMM  Eyes: Pupils are equal, round, and reactive to light. No scleral icterus.  Cardiovascular: Normal pulses.  An irregularly irregular rhythm present. Exam reveals no gallop.   Murmur heard.  Systolic murmur is present with a grade of 1/6  +1 pitting edema in LE b/l. No calf TTP  Abdominal: Normal appearance. She exhibits distension. She exhibits no ascites. Bowel sounds are increased. There is no hepatomegaly. There is tenderness in the right lower quadrant, left upper quadrant and left lower quadrant.  Musculoskeletal: She exhibits edema and tenderness.  Neurological: She is alert and oriented to person, place, and time.  Skin: Skin is warm and dry.  ecchymosis  Psychiatric: She has a normal mood and affect. Her behavior is normal.     Labs reviewed: Office Visit on 12/14/2014  Component Date Value Ref Range Status  . INR 12/14/2014 3.4   Final  . Glucose 12/14/2014 96  65 - 99 mg/dL Final  . BUN 12/14/2014 41* 8 - 27 mg/dL Final  . Creatinine, Ser 12/14/2014 0.92  0.57 - 1.00 mg/dL Final  . GFR calc non Af Amer 12/14/2014 60  >59 mL/min/1.73 Final  . GFR calc Af Amer 12/14/2014 69  >59 mL/min/1.73 Final  . BUN/Creatinine Ratio 12/14/2014 45* 11 -  26 Final  . Sodium 12/14/2014 140  134 - 144 mmol/L Final   Comment: **Effective December 28, 2014 the reference interval**   for Sodium, Serum will be changing to:                                             136 - 144   . Potassium 12/14/2014 3.2* 3.5 - 5.2 mmol/L Final   Comment: **Effective December 28, 2014 the reference interval**   for Potassium, Serum will be changing to:                          0 -  7 days        3.7 - 5.2                           8 - 30 days        3.7 - 6.4                          1 -  6 months      3.8 - 6.0                   7 months -  1 year        3.8 - 5.3                              >1 year        3.5 - 5.2   . Chloride 12/14/2014 96* 97 - 108 mmol/L Final   Comment: **Effective December 28, 2014 the reference interval**   for Chloride, Serum will be changing to:                                              97 - 106   . CO2 12/14/2014 28  18 - 29 mmol/L Final  . Calcium 12/14/2014 9.4  8.7 - 10.3 mg/dL Final  . Total Protein 12/14/2014 6.0  6.0 - 8.5 g/dL Final  . Albumin 12/14/2014 4.0  3.5 - 4.8 g/dL Final  . Globulin, Total 12/14/2014 2.0  1.5 - 4.5 g/dL Final  . Albumin/Globulin Ratio 12/14/2014 2.0  1.1 - 2.5 Final  . Bilirubin Total 12/14/2014 0.4  0.0 - 1.2 mg/dL Final  . Alkaline Phosphatase 12/14/2014 118* 39 - 117 IU/L Final  . AST 12/14/2014 37  0 - 40 IU/L Final  . ALT 12/14/2014 13  0 - 32 IU/L Final  . WBC 12/14/2014 6.0  3.4 - 10.8 x10E3/uL Final  . RBC 12/14/2014 4.13  3.77 - 5.28 x10E6/uL Final  . Hemoglobin 12/14/2014 11.9  11.1 - 15.9 g/dL Final  . Hematocrit 12/14/2014 36.5  34.0 - 46.6 % Final  . MCV 12/14/2014 88  79 - 97 fL Final  . MCH 12/14/2014 28.8  26.6 - 33.0 pg Final  . MCHC 12/14/2014 32.6  31.5 - 35.7 g/dL Final  . RDW 12/14/2014 15.2  12.3 - 15.4 % Final  . Platelets 12/14/2014 263  150 - 379 x10E3/uL Final  . PREALBUMIN 12/14/2014 25  9 -  32 mg/dL Final  Lab on 12/08/2014  Component Date Value Ref Range Status  . INR 12/08/2014 2.9* 0.8 - 1.2 Final   Comment: Reference interval is for non-anticoagulated patients. Suggested INR therapeutic range for Vitamin K antagonist therapy:    Standard Dose (moderate intensity                   therapeutic range):       2.0 - 3.0    Higher intensity therapeutic range       2.5 - 3.5   . Prothrombin Time 12/08/2014 29.4* 9.1 - 12.0 sec Final  Office Visit on 11/26/2014  Component Date Value Ref Range Status   . CRP 11/26/2014 <0.5  <0.60 mg/dL Final  . Sed Rate 11/26/2014 14  0 - 30 mm/hr Final  . WBC 11/26/2014 7.1  4.0 - 10.5 K/uL Final  . RBC 11/26/2014 3.81* 3.87 - 5.11 MIL/uL Final  . Hemoglobin 11/26/2014 11.2* 12.0 - 15.0 g/dL Final  . HCT 11/26/2014 35.0* 36.0 - 46.0 % Final  . MCV 11/26/2014 91.9  78.0 - 100.0 fL Final  . MCH 11/26/2014 29.4  26.0 - 34.0 pg Final  . MCHC 11/26/2014 32.0  30.0 - 36.0 g/dL Final  . RDW 11/26/2014 16.7* 11.5 - 15.5 % Final  . Platelets 11/26/2014 280  150 - 400 K/uL Final  . MPV 11/26/2014 10.5  8.6 - 12.4 fL Final  . Neutrophils Relative % 11/26/2014 66  43 - 77 % Final  . Neutro Abs 11/26/2014 4.7  1.7 - 7.7 K/uL Final  . Lymphocytes Relative 11/26/2014 22  12 - 46 % Final  . Lymphs Abs 11/26/2014 1.6  0.7 - 4.0 K/uL Final  . Monocytes Relative 11/26/2014 10  3 - 12 % Final  . Monocytes Absolute 11/26/2014 0.7  0.1 - 1.0 K/uL Final  . Eosinophils Relative 11/26/2014 1  0 - 5 % Final  . Eosinophils Absolute 11/26/2014 0.1  0.0 - 0.7 K/uL Final  . Basophils Relative 11/26/2014 1  0 - 1 % Final  . Basophils Absolute 11/26/2014 0.1  0.0 - 0.1 K/uL Final  . Smear Review 11/26/2014 SEE NOTE   Final   Comment:   RBC, Platelet and WBC Morphology unremarkable   . Sodium 11/26/2014 142  135 - 146 mmol/L Final  . Potassium 11/26/2014 4.9  3.5 - 5.3 mmol/L Final  . Chloride 11/26/2014 103  98 - 110 mmol/L Final  . CO2 11/26/2014 26  20 - 31 mmol/L Final  . Glucose, Bld 11/26/2014 94  65 - 99 mg/dL Final  . BUN 11/26/2014 27* 7 - 25 mg/dL Final  . Creat 11/26/2014 0.92  0.60 - 0.93 mg/dL Final  . Calcium 11/26/2014 9.8  8.6 - 10.4 mg/dL Final   Comment: ** Please note change in unit of measure and reference range(s). **     . Prothrombin Time 11/26/2014 21.4* 11.6 - 15.2 seconds Final  . INR 11/26/2014 1.86* 0.00 - 1.49 Final  Admission on 10/29/2014, Discharged on 10/30/2014  Component Date Value Ref Range Status  . Sodium 10/29/2014 140  135 - 145  mmol/L Final  . Potassium 10/29/2014 3.7  3.5 - 5.1 mmol/L Final  . Chloride 10/29/2014 105  101 - 111 mmol/L Final  . CO2 10/29/2014 30  22 - 32 mmol/L Final  . Glucose, Bld 10/29/2014 93  65 - 99 mg/dL Final  . BUN 10/29/2014 19  6 - 20 mg/dL Final  . Creatinine, Ser  10/29/2014 1.14* 0.44 - 1.00 mg/dL Final  . Calcium 10/29/2014 8.5* 8.9 - 10.3 mg/dL Final  . Total Protein 10/29/2014 5.6* 6.5 - 8.1 g/dL Final  . Albumin 10/29/2014 3.0* 3.5 - 5.0 g/dL Final  . AST 10/29/2014 23  15 - 41 U/L Final  . ALT 10/29/2014 11* 14 - 54 U/L Final  . Alkaline Phosphatase 10/29/2014 131* 38 - 126 U/L Final  . Total Bilirubin 10/29/2014 0.7  0.3 - 1.2 mg/dL Final  . GFR calc non Af Amer 10/29/2014 45* >60 mL/min Final  . GFR calc Af Amer 10/29/2014 52* >60 mL/min Final   Comment: (NOTE) The eGFR has been calculated using the CKD EPI equation. This calculation has not been validated in all clinical situations. eGFR's persistently <60 mL/min signify possible Chronic Kidney Disease.   . Anion gap 10/29/2014 5  5 - 15 Final  . Magnesium 10/29/2014 2.3  1.7 - 2.4 mg/dL Final  . Phosphorus 10/29/2014 3.7  2.5 - 4.6 mg/dL Final  . WBC 10/29/2014 8.8  4.0 - 10.5 K/uL Final  . RBC 10/29/2014 3.04* 3.87 - 5.11 MIL/uL Final  . Hemoglobin 10/29/2014 8.6* 12.0 - 15.0 g/dL Final  . HCT 10/29/2014 28.0* 36.0 - 46.0 % Final  . MCV 10/29/2014 92.1  78.0 - 100.0 fL Final  . MCH 10/29/2014 28.3  26.0 - 34.0 pg Final  . MCHC 10/29/2014 30.7  30.0 - 36.0 g/dL Final  . RDW 10/29/2014 19.3* 11.5 - 15.5 % Final  . Platelets 10/29/2014 284  150 - 400 K/uL Final  . Neutrophils Relative % 10/29/2014 77  43 - 77 % Final  . Neutro Abs 10/29/2014 6.8  1.7 - 7.7 K/uL Final  . Lymphocytes Relative 10/29/2014 15  12 - 46 % Final  . Lymphs Abs 10/29/2014 1.3  0.7 - 4.0 K/uL Final  . Monocytes Relative 10/29/2014 7  3 - 12 % Final  . Monocytes Absolute 10/29/2014 0.6  0.1 - 1.0 K/uL Final  . Eosinophils Relative 10/29/2014  1  0 - 5 % Final  . Eosinophils Absolute 10/29/2014 0.0  0.0 - 0.7 K/uL Final  . Basophils Relative 10/29/2014 0  0 - 1 % Final  . Basophils Absolute 10/29/2014 0.0  0.0 - 0.1 K/uL Final  . aPTT 10/29/2014 34  24 - 37 seconds Final  . Prothrombin Time 10/29/2014 28.6* 11.6 - 15.2 seconds Final  . INR 10/29/2014 2.74* 0.00 - 1.49 Final  . TSH 10/29/2014 0.851  0.350 - 4.500 uIU/mL Final  . B Natriuretic Peptide 10/29/2014 853.9* 0.0 - 100.0 pg/mL Final  . Sodium 10/30/2014 141  135 - 145 mmol/L Final  . Potassium 10/30/2014 3.9  3.5 - 5.1 mmol/L Final  . Chloride 10/30/2014 105  101 - 111 mmol/L Final  . CO2 10/30/2014 28  22 - 32 mmol/L Final  . Glucose, Bld 10/30/2014 92  65 - 99 mg/dL Final  . BUN 10/30/2014 24* 6 - 20 mg/dL Final  . Creatinine, Ser 10/30/2014 1.58* 0.44 - 1.00 mg/dL Final  . Calcium 10/30/2014 8.3* 8.9 - 10.3 mg/dL Final  . Total Protein 10/30/2014 4.9* 6.5 - 8.1 g/dL Final  . Albumin 10/30/2014 2.8* 3.5 - 5.0 g/dL Final  . AST 10/30/2014 21  15 - 41 U/L Final  . ALT 10/30/2014 10* 14 - 54 U/L Final  . Alkaline Phosphatase 10/30/2014 118  38 - 126 U/L Final  . Total Bilirubin 10/30/2014 0.7  0.3 - 1.2 mg/dL Final  . GFR calc  non Af Amer 10/30/2014 30* >60 mL/min Final  . GFR calc Af Amer 10/30/2014 35* >60 mL/min Final   Comment: (NOTE) The eGFR has been calculated using the CKD EPI equation. This calculation has not been validated in all clinical situations. eGFR's persistently <60 mL/min signify possible Chronic Kidney Disease.   . Anion gap 10/30/2014 8  5 - 15 Final  . WBC 10/30/2014 6.8  4.0 - 10.5 K/uL Final  . RBC 10/30/2014 2.90* 3.87 - 5.11 MIL/uL Final  . Hemoglobin 10/30/2014 8.6* 12.0 - 15.0 g/dL Final  . HCT 10/30/2014 27.1* 36.0 - 46.0 % Final  . MCV 10/30/2014 93.4  78.0 - 100.0 fL Final  . MCH 10/30/2014 29.7  26.0 - 34.0 pg Final  . MCHC 10/30/2014 31.7  30.0 - 36.0 g/dL Final  . RDW 10/30/2014 19.7* 11.5 - 15.5 % Final  . Platelets  10/30/2014 288  150 - 400 K/uL Final  . Prothrombin Time 10/30/2014 30.1* 11.6 - 15.2 seconds Final  . INR 10/30/2014 2.93* 0.00 - 1.49 Final  . Glucose-Capillary 10/30/2014 91  65 - 99 mg/dL Final  Admission on 10/13/2014, Discharged on 10/15/2014  Component Date Value Ref Range Status  . WBC 10/13/2014 6.4  4.0 - 10.5 K/uL Final  . RBC 10/13/2014 3.42* 3.87 - 5.11 MIL/uL Final  . Hemoglobin 10/13/2014 9.8* 12.0 - 15.0 g/dL Final  . HCT 10/13/2014 30.5* 36.0 - 46.0 % Final  . MCV 10/13/2014 89.2  78.0 - 100.0 fL Final  . MCH 10/13/2014 28.7  26.0 - 34.0 pg Final  . MCHC 10/13/2014 32.1  30.0 - 36.0 g/dL Final  . RDW 10/13/2014 16.0* 11.5 - 15.5 % Final  . Platelets 10/13/2014 453* 150 - 400 K/uL Final  . Sodium 10/13/2014 139  135 - 145 mmol/L Final  . Potassium 10/13/2014 4.0  3.5 - 5.1 mmol/L Final  . Chloride 10/13/2014 110  101 - 111 mmol/L Final  . CO2 10/13/2014 24  22 - 32 mmol/L Final  . Glucose, Bld 10/13/2014 86  65 - 99 mg/dL Final  . BUN 10/13/2014 16  6 - 20 mg/dL Final  . Creatinine, Ser 10/13/2014 0.69  0.44 - 1.00 mg/dL Final  . Calcium 10/13/2014 7.6* 8.9 - 10.3 mg/dL Final  . GFR calc non Af Amer 10/13/2014 >60  >60 mL/min Final  . GFR calc Af Amer 10/13/2014 >60  >60 mL/min Final   Comment: (NOTE) The eGFR has been calculated using the CKD EPI equation. This calculation has not been validated in all clinical situations. eGFR's persistently <60 mL/min signify possible Chronic Kidney Disease.   . Anion gap 10/13/2014 5  5 - 15 Final  . Prothrombin Time 10/13/2014 14.4  11.6 - 15.2 seconds Final  . INR 10/13/2014 1.10  0.00 - 1.49 Final  . aPTT 10/13/2014 29  24 - 37 seconds Final  . Color, Urine 10/13/2014 YELLOW  YELLOW Final  . APPearance 10/13/2014 CLOUDY* CLEAR Final  . Specific Gravity, Urine 10/13/2014 1.016  1.005 - 1.030 Final  . pH 10/13/2014 6.0  5.0 - 8.0 Final  . Glucose, UA 10/13/2014 NEGATIVE  NEGATIVE mg/dL Final  . Hgb urine dipstick  10/13/2014 NEGATIVE  NEGATIVE Final  . Bilirubin Urine 10/13/2014 NEGATIVE  NEGATIVE Final  . Ketones, ur 10/13/2014 NEGATIVE  NEGATIVE mg/dL Final  . Protein, ur 10/13/2014 NEGATIVE  NEGATIVE mg/dL Final  . Urobilinogen, UA 10/13/2014 0.2  0.0 - 1.0 mg/dL Final  . Nitrite 10/13/2014 NEGATIVE  NEGATIVE Final  .  Leukocytes, UA 10/13/2014 NEGATIVE  NEGATIVE Final   MICROSCOPIC NOT DONE ON URINES WITH NEGATIVE PROTEIN, BLOOD, LEUKOCYTES, NITRITE, OR GLUCOSE <1000 mg/dL.  . ABO/RH(D) 10/13/2014 B POS   Final  . Antibody Screen 10/13/2014 NEG   Final  . Sample Expiration 10/13/2014 10/16/2014   Final  . Specimen Description 10/13/2014 SYNOVIAL   Final  . Special Requests 10/13/2014 PATIENT ON FOLLOWING VANCOMYCIN IV   Final  . Gram Stain 10/13/2014    Final                   Value:FEW WBC PRESENT,BOTH PMN AND MONONUCLEAR NO ORGANISMS SEEN Performed at [facility]   . Culture 10/13/2014    Final                   Value:NO ANAEROBES ISOLATED Performed at [facility]   . Report Status 10/13/2014 10/17/2014 FINAL   Final  . Specimen Description 10/13/2014 SYNOVIAL LEFT KNEE   Final  . Special Requests 10/13/2014 PATIENT ON FOLLOWING VANCOMYCIN IV   Final  . Gram Stain 10/13/2014    Final                   Value:NO WBC SEEN NO ORGANISMS SEEN Performed at [facility]   . Culture 10/13/2014    Final                   Value:NO ANAEROBES ISOLATED Performed at [facility]   . Report Status 10/13/2014 10/17/2014 FINAL   Final  . Color, Synovial 10/13/2014 YELLOW* YELLOW Final  . Appearance-Synovial 10/13/2014 TURBID* CLEAR Final  . Crystals, Fluid 10/13/2014 NO CRYSTALS SEEN   Final  . WBC, Synovial 10/13/2014 UNABLE TO PERFORM COUNT DUE TO CLOT IN SPECIMEN  0 - 200 /cu mm Final  . Neutrophil, Synovial 10/13/2014 80* 0 - 25 % Final  . Lymphocytes-Synovial Fld 10/13/2014 6  0 - 20 % Final  . Monocyte-Macrophage-Synovial Fluid 10/13/2014 14* 50 - 90 % Final    . Specimen Description 10/13/2014 SYNOVIAL LEFT KNEE   Final  . Special Requests 10/13/2014 PATIENT ON FOLLOWING VANCOMYCIN IV SWAB SPECIMEN   Final  . Gram Stain 10/13/2014    Final                   Value:MODERATE WBC PRESENT,BOTH PMN AND MONONUCLEAR NO ORGANISMS SEEN   . Culture 10/13/2014    Final                   Value:NO GROWTH 3 DAYS Performed at [facility]   . Report Status 10/13/2014 10/16/2014 FINAL   Final  . Specimen Description 10/13/2014 SYNOVIAL LEFT KNEE   Final  . Special Requests 10/13/2014 PATIENT ON FOLLOWING VANCOMYCIN IV   Final  . Gram Stain 10/13/2014    Final                   Value:ABUNDANT WBC PRESENT, PREDOMINANTLY PMN NO ORGANISMS SEEN   . Culture 10/13/2014    Final                   Value:NO GROWTH 3 DAYS Performed at [facility]   . Report Status 10/13/2014 10/16/2014 FINAL   Final  . WBC 10/14/2014 6.7  4.0 - 10.5 K/uL Final  . RBC 10/14/2014 3.19* 3.87 - 5.11 MIL/uL Final  . Hemoglobin 10/14/2014 9.0* 12.0 - 15.0 g/dL Final  . HCT  10/14/2014 28.7* 36.0 - 46.0 % Final  . MCV 10/14/2014 90.0  78.0 - 100.0 fL Final  . MCH 10/14/2014 28.2  26.0 - 34.0 pg Final  . MCHC 10/14/2014 31.4  30.0 - 36.0 g/dL Final  . RDW 10/14/2014 16.2* 11.5 - 15.5 % Final  . Platelets 10/14/2014 409* 150 - 400 K/uL Final  . Sodium 10/14/2014 139  135 - 145 mmol/L Final  . Potassium 10/14/2014 4.7  3.5 - 5.1 mmol/L Final  . Chloride 10/14/2014 108  101 - 111 mmol/L Final  . CO2 10/14/2014 24  22 - 32 mmol/L Final  . Glucose, Bld 10/14/2014 202* 65 - 99 mg/dL Final  . BUN 10/14/2014 15  6 - 20 mg/dL Final  . Creatinine, Ser 10/14/2014 0.69  0.44 - 1.00 mg/dL Final  . Calcium 10/14/2014 7.9* 8.9 - 10.3 mg/dL Final  . GFR calc non Af Amer 10/14/2014 >60  >60 mL/min Final  . GFR calc Af Amer 10/14/2014 >60  >60 mL/min Final   Comment: (NOTE) The eGFR has been calculated using the CKD EPI equation. This calculation has not been validated in all  clinical situations. eGFR's persistently <60 mL/min signify possible Chronic Kidney Disease.   . Anion gap 10/14/2014 7  5 - 15 Final  . Specimen Description 10/13/2014 BLOOD RIGHT HAND   Final  . Special Requests 10/13/2014 BOTTLES DRAWN AEROBIC ONLY 5ML   Final  . Culture 10/13/2014    Final                   Value:NO GROWTH 5 DAYS Performed at [facility]   . Report Status 10/13/2014 10/19/2014 FINAL   Final  . Specimen Description 10/13/2014 BLOOD RIGHT ANTECUBITAL   Final  . Special Requests 10/13/2014 BOTTLES DRAWN AEROBIC ONLY 5ML   Final  . Culture 10/13/2014    Final                   Value:NO GROWTH 5 DAYS Performed at [facility]   . Report Status 10/13/2014 10/19/2014 FINAL   Final  . WBC 10/15/2014 9.4  4.0 - 10.5 K/uL Final  . RBC 10/15/2014 2.72* 3.87 - 5.11 MIL/uL Final  . Hemoglobin 10/15/2014 7.9* 12.0 - 15.0 g/dL Final  . HCT 10/15/2014 24.6* 36.0 - 46.0 % Final  . MCV 10/15/2014 90.4  78.0 - 100.0 fL Final  . MCH 10/15/2014 29.0  26.0 - 34.0 pg Final  . MCHC 10/15/2014 32.1  30.0 - 36.0 g/dL Final  . RDW 10/15/2014 16.3* 11.5 - 15.5 % Final  . Platelets 10/15/2014 337  150 - 400 K/uL Final  . Sodium 10/15/2014 138  135 - 145 mmol/L Final  . Potassium 10/15/2014 4.6  3.5 - 5.1 mmol/L Final  . Chloride 10/15/2014 109  101 - 111 mmol/L Final  . CO2 10/15/2014 25  22 - 32 mmol/L Final  . Glucose, Bld 10/15/2014 126* 65 - 99 mg/dL Final  . BUN 10/15/2014 22* 6 - 20 mg/dL Final  . Creatinine, Ser 10/15/2014 0.90  0.44 - 1.00 mg/dL Final  . Calcium 10/15/2014 7.3* 8.9 - 10.3 mg/dL Final  . GFR calc non Af Amer 10/15/2014 >60  >60 mL/min Final  . GFR calc Af Amer 10/15/2014 >60  >60 mL/min Final   Comment: (NOTE) The eGFR has been calculated using the CKD EPI equation. This calculation has not been validated in all clinical situations. eGFR's persistently <60 mL/min signify possible Chronic Kidney Disease.   Marland Kitchen  Anion gap 10/15/2014 4* 5 - 15  Final  Admission on 09/25/2014, Discharged on 09/28/2014  Component Date Value Ref Range Status  . Sodium 09/25/2014 135  135 - 145 mmol/L Final  . Potassium 09/25/2014 3.0* 3.5 - 5.1 mmol/L Final  . Chloride 09/25/2014 95* 101 - 111 mmol/L Final  . CO2 09/25/2014 29  22 - 32 mmol/L Final  . Glucose, Bld 09/25/2014 117* 65 - 99 mg/dL Final  . BUN 09/25/2014 35* 6 - 20 mg/dL Final  . Creatinine, Ser 09/25/2014 0.92  0.44 - 1.00 mg/dL Final  . Calcium 09/25/2014 8.3* 8.9 - 10.3 mg/dL Final  . GFR calc non Af Amer 09/25/2014 59* >60 mL/min Final  . GFR calc Af Amer 09/25/2014 >60  >60 mL/min Final   Comment: (NOTE) The eGFR has been calculated using the CKD EPI equation. This calculation has not been validated in all clinical situations. eGFR's persistently <60 mL/min signify possible Chronic Kidney Disease.   . Anion gap 09/25/2014 11  5 - 15 Final  . WBC 09/25/2014 11.0* 4.0 - 10.5 K/uL Final  . RBC 09/25/2014 3.96  3.87 - 5.11 MIL/uL Final  . Hemoglobin 09/25/2014 11.3* 12.0 - 15.0 g/dL Final  . HCT 09/25/2014 34.7* 36.0 - 46.0 % Final  . MCV 09/25/2014 87.6  78.0 - 100.0 fL Final  . MCH 09/25/2014 28.5  26.0 - 34.0 pg Final  . MCHC 09/25/2014 32.6  30.0 - 36.0 g/dL Final  . RDW 09/25/2014 14.5  11.5 - 15.5 % Final  . Platelets 09/25/2014 334  150 - 400 K/uL Final  . Specimen Description 09/25/2014 BLOOD LEFT ARM   Final  . Special Requests 09/25/2014 BOTTLES DRAWN AEROBIC AND ANAEROBIC 10CC   Final  . Culture 09/25/2014    Final                   Value:NO GROWTH 5 DAYS Performed at [facility]   . Report Status 09/25/2014 09/30/2014 FINAL   Final  . Specimen Description 09/25/2014 BLOOD RIGHT ARM   Final  . Special Requests 09/25/2014 BOTTLES DRAWN AEROBIC AND ANAEROBIC [place]   Final  . Culture 09/25/2014    Final                   Value:NO GROWTH 5 DAYS Performed at [facility]   . Report Status 09/25/2014 09/30/2014 FINAL   Final  . Sodium 09/26/2014 138   135 - 145 mmol/L Final  . Potassium 09/26/2014 4.4  3.5 - 5.1 mmol/L Final   Comment: RESULT REPEATED AND VERIFIED DELTA CHECK NOTED NO VISIBLE HEMOLYSIS   . Chloride 09/26/2014 101  101 - 111 mmol/L Final  . CO2 09/26/2014 29  22 - 32 mmol/L Final  . Glucose, Bld 09/26/2014 90  65 - 99 mg/dL Final  . BUN 09/26/2014 32* 6 - 20 mg/dL Final  . Creatinine, Ser 09/26/2014 1.14* 0.44 - 1.00 mg/dL Final  . Calcium 09/26/2014 7.5* 8.9 - 10.3 mg/dL Final  . GFR calc non Af Amer 09/26/2014 45* >60 mL/min Final  . GFR calc Af Amer 09/26/2014 52* >60 mL/min Final   Comment: (NOTE) The eGFR has been calculated using the CKD EPI equation. This calculation has not been validated in all clinical situations. eGFR's persistently <60 mL/min signify possible Chronic Kidney Disease.   . Anion gap 09/26/2014 8  5 - 15 Final  . WBC 09/26/2014 9.0  4.0 - 10.5 K/uL Final  . RBC 09/26/2014  3.34* 3.87 - 5.11 MIL/uL Final  . Hemoglobin 09/26/2014 9.7* 12.0 - 15.0 g/dL Final  . HCT 09/26/2014 29.5* 36.0 - 46.0 % Final  . MCV 09/26/2014 88.3  78.0 - 100.0 fL Final  . MCH 09/26/2014 29.0  26.0 - 34.0 pg Final  . MCHC 09/26/2014 32.9  30.0 - 36.0 g/dL Final  . RDW 09/26/2014 14.9  11.5 - 15.5 % Final  . Platelets 09/26/2014 321  150 - 400 K/uL Final  . Sodium 09/27/2014 136  135 - 145 mmol/L Final  . Potassium 09/27/2014 5.8* 3.5 - 5.1 mmol/L Final   Comment: SPECIMEN HEMOLYZED. HEMOLYSIS MAY AFFECT INTEGRITY OF RESULTS. DELTA CHECK NOTED   . Chloride 09/27/2014 106  101 - 111 mmol/L Final  . CO2 09/27/2014 22  22 - 32 mmol/L Final  . Glucose, Bld 09/27/2014 78  65 - 99 mg/dL Final  . BUN 09/27/2014 29* 6 - 20 mg/dL Final  . Creatinine, Ser 09/27/2014 0.95  0.44 - 1.00 mg/dL Final  . Calcium 09/27/2014 7.4* 8.9 - 10.3 mg/dL Final  . GFR calc non Af Amer 09/27/2014 56* >60 mL/min Final  . GFR calc Af Amer 09/27/2014 >60  >60 mL/min Final   Comment: (NOTE) The eGFR has been calculated using the CKD EPI  equation. This calculation has not been validated in all clinical situations. eGFR's persistently <60 mL/min signify possible Chronic Kidney Disease.   . Anion gap 09/27/2014 8  5 - 15 Final  . Sodium 09/28/2014 138  135 - 145 mmol/L Final  . Potassium 09/28/2014 4.3  3.5 - 5.1 mmol/L Final   Comment: DELTA CHECK NOTED REPEATED TO VERIFY   . Chloride 09/28/2014 109  101 - 111 mmol/L Final  . CO2 09/28/2014 24  22 - 32 mmol/L Final  . Glucose, Bld 09/28/2014 87  65 - 99 mg/dL Final  . BUN 09/28/2014 23* 6 - 20 mg/dL Final  . Creatinine, Ser 09/28/2014 0.92  0.44 - 1.00 mg/dL Final  . Calcium 09/28/2014 7.4* 8.9 - 10.3 mg/dL Final  . GFR calc non Af Amer 09/28/2014 59* >60 mL/min Final  . GFR calc Af Amer 09/28/2014 >60  >60 mL/min Final   Comment: (NOTE) The eGFR has been calculated using the CKD EPI equation. This calculation has not been validated in all clinical situations. eGFR's persistently <60 mL/min signify possible Chronic Kidney Disease.   . Anion gap 09/28/2014 5  5 - 15 Final    No results found.   Assessment/Plan   ICD-9-CM ICD-10-CM   1. Diarrhea of presumed infectious origin - probable C diff given recent abx use 009.3 A09 metroNIDAZOLE (FLAGYL) 500 MG tablet     CBC with Differential     CMP     C. difficile, PCR  2. Ulcerative colitis, chronic, without complications (HCC) 449.6 K51.90 CBC with Differential     CMP  3. Generalized abdominal pain probably due to #1 789.07 R10.84 CBC with Differential     CMP  4. Infection of prosthetic left knee joint (HCC) 996.66 T84.54XA    V43.65    5. Long term (current) use of anticoagulants for Afib V58.61 Z79.01   6. Current use of steroid medication due to RA V58.65 Z79.52     Push fluids and rest. Clear liquid diet and advance as tolerated. Recommend you avoid foods with high fat, milk/diary and high sugar until feeling better  Education handout for diarrhea given  May continue OTC simethicone prn and align  daily  Start flagyl 564m twice daily x 10 days  Continue other medications as ordered  Follow up with specialists as scheduled. Will d/w ID regarding continuation of abx or if they can be stopped in light of her sx's  Keep appt with Dr RMariea Clontsas scheduled.   Chealsey Miyamoto S. CPerlie Gold PSt [facility] [address] [phone] (Monday-Friday 8 AM - 5 PM) ([phone] 5 PM and follow prompts

## 2014-12-25 NOTE — Telephone Encounter (Signed)
Patient stated she saw you today and you prescribed her an antibiotic. She stated that you were going to talk with Dr. Drucilla Schmidt before she started taking it. She wants to know if you have talked to him and can she start taking the antibiotic? Please Advise.

## 2014-12-25 NOTE — Patient Instructions (Addendum)
Push fluids and rest. Clear liquid diet and advance as tolerated. Recommend you avoid foods with high fat, milk/diary and high sugar until feeling better  Start flagyl 500mg  twice daily x 10 days  Continue other medications as ordered  Follow up with specialists as scheduled  Keep appt with Dr Mariea Clonts as scheduled  Diarrhea Diarrhea is frequent loose and watery bowel movements. It can cause you to feel weak and dehydrated. Dehydration can cause you to become tired and thirsty, have a dry mouth, and have decreased urination that often is dark yellow. Diarrhea is a sign of another problem, most often an infection that will not last long. In most cases, diarrhea typically lasts 2-3 days. However, it can last longer if it is a sign of something more serious. It is important to treat your diarrhea as directed by your caregiver to lessen or prevent future episodes of diarrhea. CAUSES  Some common causes include:  Gastrointestinal infections caused by viruses, bacteria, or parasites.  Food poisoning or food allergies.  Certain medicines, such as antibiotics, chemotherapy, and laxatives.  Artificial sweeteners and fructose.  Digestive disorders. HOME CARE INSTRUCTIONS  Ensure adequate fluid intake (hydration): Have 1 cup (8 oz) of fluid for each diarrhea episode. Avoid fluids that contain simple sugars or sports drinks, fruit juices, whole milk products, and sodas. Your urine should be clear or pale yellow if you are drinking enough fluids. Hydrate with an oral rehydration solution that you can purchase at pharmacies, retail stores, and online. You can prepare an oral rehydration solution at home by mixing the following ingredients together:   - tsp table salt.   tsp baking soda.   tsp salt substitute containing potassium chloride.  1  tablespoons sugar.  1 L (34 oz) of water.  Certain foods and beverages may increase the speed at which food moves through the gastrointestinal (GI) tract.  These foods and beverages should be avoided and include:  Caffeinated and alcoholic beverages.  High-fiber foods, such as raw fruits and vegetables, nuts, seeds, and whole grain breads and cereals.  Foods and beverages sweetened with sugar alcohols, such as xylitol, sorbitol, and mannitol.  Some foods may be well tolerated and may help thicken stool including:  Starchy foods, such as rice, toast, pasta, low-sugar cereal, oatmeal, grits, baked potatoes, crackers, and bagels.  Bananas.  Applesauce.  Add probiotic-rich foods to help increase healthy bacteria in the GI tract, such as yogurt and fermented milk products.  Wash your hands well after each diarrhea episode.  Only take over-the-counter or prescription medicines as directed by your caregiver.  Take a warm bath to relieve any burning or pain from frequent diarrhea episodes. SEEK IMMEDIATE MEDICAL CARE IF:   You are unable to keep fluids down.  You have persistent vomiting.  You have blood in your stool, or your stools are black and tarry.  You do not urinate in 6-8 hours, or there is only a small amount of very dark urine.  You have abdominal pain that increases or localizes.  You have weakness, dizziness, confusion, or light-headedness.  You have a severe headache.  Your diarrhea gets worse or does not get better.  You have a fever or persistent symptoms for more than 2-3 days.  You have a fever and your symptoms suddenly get worse. MAKE SURE YOU:   Understand these instructions.  Will watch your condition.  Will get help right away if you are not doing well or get worse.   This information is  not intended to replace advice given to you by your health care provider. Make sure you discuss any questions you have with your health care provider.   Document Released: 02/17/2002 Document Revised: 03/20/2014 Document Reviewed: 11/05/2011 Elsevier Interactive Patient Education [employer].

## 2014-12-25 NOTE — Telephone Encounter (Signed)
She needs to start taking flagyl as Rx AFTER SHE COLLECTS STOOL SAMPLE. I am still waiting to hear from ID on whether she should continue the doxy and keflex

## 2014-12-26 LAB — CBC WITH DIFFERENTIAL/PLATELET
BASOS: 0 %
Basophils Absolute: 0 10*3/uL (ref 0.0–0.2)
EOS (ABSOLUTE): 0.1 10*3/uL (ref 0.0–0.4)
Eos: 2 %
Hematocrit: 40 % (ref 34.0–46.6)
Hemoglobin: 12.7 g/dL (ref 11.1–15.9)
IMMATURE GRANULOCYTES: 0 %
Immature Grans (Abs): 0 10*3/uL (ref 0.0–0.1)
LYMPHS ABS: 1.2 10*3/uL (ref 0.7–3.1)
Lymphs: 17 %
MCH: 28.7 pg (ref 26.6–33.0)
MCHC: 31.8 g/dL (ref 31.5–35.7)
MCV: 90 fL (ref 79–97)
MONOS ABS: 0.6 10*3/uL (ref 0.1–0.9)
Monocytes: 8 %
NEUTROS ABS: 5.1 10*3/uL (ref 1.4–7.0)
NEUTROS PCT: 73 %
PLATELETS: 226 10*3/uL (ref 150–379)
RBC: 4.43 x10E6/uL (ref 3.77–5.28)
RDW: 15.2 % (ref 12.3–15.4)
WBC: 7 10*3/uL (ref 3.4–10.8)

## 2014-12-26 LAB — COMPREHENSIVE METABOLIC PANEL
A/G RATIO: 2.1 (ref 1.1–2.5)
ALT: 15 IU/L (ref 0–32)
AST: 41 IU/L — AB (ref 0–40)
Albumin: 4 g/dL (ref 3.5–4.8)
Alkaline Phosphatase: 114 IU/L (ref 39–117)
BILIRUBIN TOTAL: 0.4 mg/dL (ref 0.0–1.2)
BUN/Creatinine Ratio: 37 — ABNORMAL HIGH (ref 11–26)
BUN: 32 mg/dL — ABNORMAL HIGH (ref 8–27)
CHLORIDE: 99 mmol/L (ref 97–108)
CO2: 26 mmol/L (ref 18–29)
Calcium: 9.2 mg/dL (ref 8.7–10.3)
Creatinine, Ser: 0.87 mg/dL (ref 0.57–1.00)
GFR calc Af Amer: 74 mL/min/{1.73_m2} (ref >59)
GFR calc non Af Amer: 64 mL/min/{1.73_m2} (ref >59)
Globulin, Total: 1.9 g/dL (ref 1.5–4.5)
Glucose: 98 mg/dL (ref 65–99)
POTASSIUM: 4.1 mmol/L (ref 3.5–5.2)
SODIUM: 139 mmol/L (ref 134–144)
Total Protein: 5.9 g/dL — ABNORMAL LOW (ref 6.0–8.5)

## 2014-12-28 ENCOUNTER — Encounter: Payer: Self-pay | Admitting: Pharmacotherapy

## 2014-12-28 ENCOUNTER — Ambulatory Visit (INDEPENDENT_AMBULATORY_CARE_PROVIDER_SITE_OTHER): Payer: Medicare Other | Admitting: Pharmacotherapy

## 2014-12-28 ENCOUNTER — Telehealth: Payer: Self-pay

## 2014-12-28 ENCOUNTER — Other Ambulatory Visit: Payer: Medicare Other

## 2014-12-28 VITALS — BP 142/70 | HR 64 | Temp 98.1°F | Resp 20 | Ht 67.0 in | Wt 128.0 lb

## 2014-12-28 DIAGNOSIS — Z5181 Encounter for therapeutic drug level monitoring: Secondary | ICD-10-CM

## 2014-12-28 DIAGNOSIS — I480 Paroxysmal atrial fibrillation: Secondary | ICD-10-CM | POA: Diagnosis not present

## 2014-12-28 DIAGNOSIS — Z7901 Long term (current) use of anticoagulants: Secondary | ICD-10-CM

## 2014-12-28 DIAGNOSIS — R197 Diarrhea, unspecified: Secondary | ICD-10-CM

## 2014-12-28 LAB — POCT INR: INR: 4.7

## 2014-12-28 MED ORDER — WARFARIN SODIUM 4 MG PO TABS: ORAL_TABLET | ORAL | Status: DC

## 2014-12-28 NOTE — Telephone Encounter (Signed)
Discussed with, patient verbalized understanding of recommendations. Patient had not started flagyl yet, patient will now get rx filled.

## 2014-12-28 NOTE — Telephone Encounter (Signed)
Patient notified and agreed.  

## 2014-12-28 NOTE — Patient Instructions (Signed)
No Coumadin x 2 doses Resume dose at 2mg  daily (1/2 tablet)

## 2014-12-28 NOTE — Progress Notes (Signed)
   Subjective:    Patient ID: [name], female    DOB: [birth year], 80 y.o.   MRN: [id number]  HPI Last INR was high at 3.4. Coumadin was reduced to 4mg  QD except 2mg  Mondays She has had 2 weeks of diarrhea.  Will start on Flagyl today She has multiple bruises. NO bleeding. Less vitamin K intake   Review of Systems  Constitutional: Positive for fatigue. Negative for fever and chills.  HENT: Negative for nosebleeds.   Gastrointestinal: Negative for blood in stool and anal bleeding.  Genitourinary: Negative for hematuria.  Hematological: Bruises/bleeds easily.       Objective:   Physical Exam  Constitutional: She is oriented to person, place, and time. She appears well-developed and well-nourished.  HENT:  Right Ear: External ear normal.  Left Ear: External ear normal.  Cardiovascular: Normal rate, regular rhythm and normal heart sounds.   Pulmonary/Chest: Effort normal and breath sounds normal.  Neurological: She is alert and oriented to person, place, and time.  Skin:  Multiple bruises  Psychiatric: She has a normal mood and affect. Her behavior is normal. Judgment and thought content normal.  Vitals reviewed.    BP: 142/70  HR; 64  Wt: 128lb INR 4.7     Assessment & Plan:  1.  No Coumadin x 2 doses 2.  The resume Coumadin at 2mg  daily 3.  INR 1 week

## 2014-12-28 NOTE — Telephone Encounter (Signed)
-----   Message from [name], [name] sent at 12/27/2014  9:49 PM EDT ----- Hi [name],  I saw this pt last week for diarrhea. Please call her and tell her that Dr Tommy Medal said to continue both knee infection antibiotics and f/u as scheduled. She also needs to finish the flagyl Rx by our office and continue probiotic. Thanks  Dr Loletha Grayer

## 2014-12-29 LAB — CLOSTRIDIUM DIFFICILE BY PCR: Toxigenic C. Difficile by PCR: NEGATIVE

## 2014-12-30 DIAGNOSIS — L97821 Non-pressure chronic ulcer of other part of left lower leg limited to breakdown of skin: Secondary | ICD-10-CM | POA: Diagnosis not present

## 2015-01-04 ENCOUNTER — Ambulatory Visit (INDEPENDENT_AMBULATORY_CARE_PROVIDER_SITE_OTHER): Payer: Medicare Other | Admitting: Pharmacotherapy

## 2015-01-04 ENCOUNTER — Encounter: Payer: Self-pay | Admitting: Pharmacotherapy

## 2015-01-04 VITALS — BP 110/68 | HR 49 | Temp 98.2°F | Resp 20 | Ht 67.0 in | Wt 129.0 lb

## 2015-01-04 DIAGNOSIS — Z7901 Long term (current) use of anticoagulants: Secondary | ICD-10-CM | POA: Diagnosis not present

## 2015-01-04 DIAGNOSIS — I480 Paroxysmal atrial fibrillation: Secondary | ICD-10-CM

## 2015-01-04 LAB — POCT INR: INR: 4

## 2015-01-04 MED ORDER — POTASSIUM CHLORIDE CRYS ER 20 MEQ PO TBCR: 20.0000 meq | EXTENDED_RELEASE_TABLET | Freq: Every day | ORAL | Status: DC

## 2015-01-04 NOTE — Progress Notes (Signed)
   Subjective:    Patient ID: [name], female    DOB: [birth year], 80 y.o.   MRN: [id number]  HPI  Last INR was high at 4.7.  Coumadin was held and reduced. Bruising improved. No unusual bleeding Denies CP, palpitations, falls Poor appetite and eating Will finish Flagyl in 2 days    Review of Systems  HENT: Negative for nosebleeds.   Respiratory: Negative for shortness of breath.   Cardiovascular: Negative for chest pain and leg swelling.  Gastrointestinal: Positive for abdominal pain. Negative for blood in stool and anal bleeding.  Genitourinary: Negative for hematuria.  Hematological: Bruises/bleeds easily.       Objective:   Physical Exam  Constitutional: She is oriented to person, place, and time. She appears well-developed.  Very thin  HENT:  Right Ear: External ear normal.  Left Ear: External ear normal.  Cardiovascular: Normal rate, regular rhythm and normal heart sounds.   Pulmonary/Chest: Effort normal and breath sounds normal.  Neurological: She is alert and oriented to person, place, and time.  Skin: Skin is warm and dry.  Psychiatric: She has a normal mood and affect. Her behavior is normal. Judgment and thought content normal.  Vitals reviewed.  INR 4.0      Assessment & Plan:  1.  INR above target 2-3 due to Flagyl use. 2.  Hold Coumadin for 2 days. 3.  Then start Coumadin 2mg  daily 4.  RTC in 1 week

## 2015-01-04 NOTE — Patient Instructions (Signed)
INR 4.0 No Coumadin x 2 days Then resume Coumadin 2mg  (1/2 tablet) daily

## 2015-01-11 ENCOUNTER — Ambulatory Visit (INDEPENDENT_AMBULATORY_CARE_PROVIDER_SITE_OTHER): Payer: Medicare Other | Admitting: Internal Medicine

## 2015-01-11 ENCOUNTER — Encounter: Payer: Self-pay | Admitting: Internal Medicine

## 2015-01-11 ENCOUNTER — Ambulatory Visit: Payer: Medicare Other | Admitting: Pharmacotherapy

## 2015-01-11 VITALS — BP 106/66 | HR 55 | Temp 98.0°F | Resp 20 | Ht 67.0 in | Wt 128.6 lb

## 2015-01-11 DIAGNOSIS — I8312 Varicose veins of left lower extremity with inflammation: Secondary | ICD-10-CM

## 2015-01-11 DIAGNOSIS — I872 Venous insufficiency (chronic) (peripheral): Secondary | ICD-10-CM

## 2015-01-11 DIAGNOSIS — T8454XA Infection and inflammatory reaction due to internal left knee prosthesis, initial encounter: Secondary | ICD-10-CM | POA: Diagnosis not present

## 2015-01-11 DIAGNOSIS — I480 Paroxysmal atrial fibrillation: Secondary | ICD-10-CM | POA: Diagnosis not present

## 2015-01-11 DIAGNOSIS — R197 Diarrhea, unspecified: Secondary | ICD-10-CM

## 2015-01-11 DIAGNOSIS — Z7901 Long term (current) use of anticoagulants: Secondary | ICD-10-CM

## 2015-01-11 DIAGNOSIS — R001 Bradycardia, unspecified: Secondary | ICD-10-CM | POA: Diagnosis not present

## 2015-01-11 DIAGNOSIS — I9589 Other hypotension: Secondary | ICD-10-CM

## 2015-01-11 DIAGNOSIS — I8311 Varicose veins of right lower extremity with inflammation: Secondary | ICD-10-CM

## 2015-01-11 DIAGNOSIS — K5190 Ulcerative colitis, unspecified, without complications: Secondary | ICD-10-CM | POA: Diagnosis not present

## 2015-01-11 DIAGNOSIS — I773 Arterial fibromuscular dysplasia: Secondary | ICD-10-CM | POA: Diagnosis not present

## 2015-01-11 MED ORDER — FUROSEMIDE 40 MG PO TABS: 40.0000 mg | ORAL_TABLET | Freq: Every day | ORAL | Status: DC

## 2015-01-11 MED ORDER — DOXYCYCLINE HYCLATE 100 MG PO TABS: 100.0000 mg | ORAL_TABLET | Freq: Two times a day (BID) | ORAL | Status: DC

## 2015-01-11 MED ORDER — METOPROLOL SUCCINATE ER 25 MG PO TB24: 25.0000 mg | ORAL_TABLET | Freq: Two times a day (BID) | ORAL | Status: DC

## 2015-01-11 NOTE — Progress Notes (Signed)
Patient ID: [name], female   DOB: 03-05-1935, 80 y.o.   MRN: [id number]   Location: [facility] Provider: Rexene Edison. Mariea Clonts, D.O., C.M.D.  Code Status: DNR Goals of Care: Advanced Directive information Does patient have an advance directive?: No, Would patient like information on creating an advanced directive?: Yes - Educational materials given  Chief Complaint  Patient presents with  . Medical Management of Chronic Issues    1 month for EKG and Medical Management    HPI: Patient is a 80 y.o. female seen in the office today for a one month f/u intended to check heart rhythm (she was not convinced she'd ever for sure been in afib) and also to follow up on her numerous active medical conditions.  She is not having difficulty with palpitations now.  No chest pain or breathing difficulty.    Unfortunately, her left knee remains tender, mildly erythematous and distinctly warmer than the right which is concerning.  She is only on doxycycline at this time b/c Dr. Alvan Dame stopped the keflex b/c of her terrible loose stools she was having that had caused dehydration and turned out to be c diff negative. She notes the pain has worsened since the keflex was discontinued a couple of weeks ago now.  Loose stools have improved.  She feels a little bit stronger and less unsteady as a result.  She continues to feel weak and a bit lightheaded, however, with all of the metroprolol she's been taking.   She is having more difficulty now with her shoulder as she compensates.  Review of Systems:  Review of Systems  Constitutional: Positive for weight loss and malaise/fatigue. Negative for fever, chills and diaphoresis.  HENT: Negative for congestion.   Respiratory: Negative for shortness of breath.        Continues to smoke  Cardiovascular: Negative for chest pain.  Gastrointestinal: Positive for diarrhea. Negative for abdominal pain, constipation, blood in stool and melena.  Genitourinary:  Negative for dysuria, urgency and frequency.  Musculoskeletal: Positive for joint pain. Negative for falls.       Left knee, shoulder  Skin: Negative for rash.  Neurological: Positive for weakness. Negative for dizziness and loss of consciousness.  Psychiatric/Behavioral: Negative for depression and memory loss.    Past Medical History  Diagnosis Date  . Ulcerative colitis ([place])   . Hypothyroidism   . Hypertension     pcp   dr Aleister Lady   [facility]  . Pneumonia 2008  . Chronic bronchitis ([place]) 10/31/2011    "prone to it; I have sinus/bronchitis problem probably q yr"  . Multiple skin tears 10/31/2011    "get them very easily; my skin is thin and I bruise easily"  . Chronic back pain     "my entire spine"  . Edema   . Other and unspecified hyperlipidemia   . Encounter for long-term (current) use of other medications   . Senile osteoporosis   . Spinal stenosis, unspecified region other than cervical   . Unspecified glaucoma   . Hypothyroidism   . Complication of anesthesia 2009    htn with block- for hand surgery   . Anxiety     sometimes   . Rheumatoid arthritis(714.0)     degenerative disc disease   . Swelling of right upper extremity 11/26/2014    Past Surgical History  Procedure Laterality Date  . Back surgery    . Sinus surgery with instatrak    . Thyroidectomy, partial  1997  . Posterior fusion lumbar spine  10/31/2011    L2-3; 3-4  . Tonsillectomy      "when I was a small child"  . Functional endoscopic sinus surgery  1988  . Posterior fusion lumbar spine  11/2009    L3-4;  L4-5  . Joint replacement  2009; 2006    joints 2 fingers,rt hand; joint left thumb  . Spine surgery    . Posterior lumbar fusion N/A 08/05/13    T1, T2  . Eye surgery    . Anterior cervical decomp/discectomy fusion N/A 02/17/2014    Procedure: CERVICAL FOUR TO FIVE ANTERIOR CERVICAL DECOMPRESSION/DISCECTOMY FUSION 1 LEVEL;  Surgeon: [name], MD;  Location: [facility];  Service:  Neurosurgery;  Laterality: N/A;  C4-5 Anterior cervical decompression/diskectomy/fusion  . Cataract extraction Left 11/11/2013  . Cataract extraction Right 12/30/2013  . Total knee arthroplasty Left 06/16/2014    Procedure: TOTAL KNEE ARTHROPLASTY;  Surgeon: Paralee Cancel, MD;  Location: [facility];  Service: Orthopedics;  Laterality: Left;  . I&d knee with poly exchange Left 10/13/2014    Procedure: INCISION AND DRAINAGE LEFT TOTAL KNEE WITH POLY EXCHANGE;  Surgeon: Paralee Cancel, MD;  Location: [facility];  Service: Orthopedics;  Laterality: Left;    No Known Allergies    Medication List       This list is accurate as of: 01/11/15  3:26 PM.  Always use your most recent med list.               balsalazide 750 MG capsule  Commonly known as:  COLAZAL  Take 2,250 mg by mouth 2 (two) times daily.     Calcium + D3 600-200 MG-UNIT Tabs  Take 1 tablet by mouth daily with breakfast.     CELEBREX 200 MG capsule  Generic drug:  celecoxib  Take 200 mg by mouth 2 (two) times daily.     diazepam 5 MG tablet  Commonly known as:  VALIUM  Take 5 mg by mouth every 6 (six) hours as needed for muscle spasms.     docusate sodium 100 MG capsule  Commonly known as:  COLACE  Take 100 mg by mouth daily as needed for mild constipation.     doxycycline 100 MG tablet  Commonly known as:  VIBRA-TABS  Take 1 tablet (100 mg total) by mouth 2 (two) times daily.     estradiol 0.5 MG tablet  Commonly known as:  ESTRACE  Take 0.5 mg by mouth at bedtime.     feeding supplement (PRO-STAT SUGAR FREE 64) Liqd  Patient is now taking protein bars     ferrous sulfate 325 (65 FE) MG tablet  Take 1 tablet (325 mg total) by mouth daily with breakfast.     folic acid 1 MG tablet  Commonly known as:  FOLVITE  Take 2 mg by mouth daily.     furosemide 20 MG tablet  Commonly known as:  LASIX  Take 40 mg by mouth daily.     guaiFENesin 600 MG 12 hr tablet  Commonly known as:  MUCINEX  Take 1,200 mg by mouth daily as  needed (sinus relief).     latanoprost 0.005 % ophthalmic solution  Commonly known as:  XALATAN  Place 1 drop into both eyes at bedtime.     medroxyPROGESTERone 2.5 MG tablet  Commonly known as:  PROVERA  Take 2.5 mg by mouth at bedtime.     metoprolol succinate 50 MG 24 hr tablet  Commonly  known as:  TOPROL-XL  Take 1 tablet (50 mg total) by mouth 2 (two) times daily. Take with or immediately following a meal.     multivitamin with minerals Tabs tablet  Take 1 tablet by mouth daily.     oxyCODONE 5 MG immediate release tablet  Commonly known as:  Oxy IR/ROXICODONE  Take 1 tablet (5 mg total) by mouth 2 (two) times daily as needed for severe pain. 1 by mouth every 4 hours as needed for moderate pain, 2 by mouth as needed for severe pain     oxyCODONE-acetaminophen 5-325 MG tablet  Commonly known as:  PERCOCET/ROXICET  Take 1 tablet by mouth every 6 (six) hours as needed. for pain     polyethylene glycol packet  Commonly known as:  MIRALAX / GLYCOLAX  Take 17 g by mouth 2 (two) times daily.     potassium chloride SA 20 MEQ tablet  Commonly known as:  K-DUR,KLOR-CON  Take 1 tablet (20 mEq total) by mouth daily.     predniSONE 1 MG tablet  Commonly known as:  DELTASONE  Take 3 tablets (3 mg total) by mouth daily with breakfast.     SYNTHROID 88 MCG tablet  Generic drug:  levothyroxine  Take 88 mcg by mouth daily before breakfast. DAW, DO NOT SUBSTITUTE WITH GENERIC     Vitamin D3 1000 UNITS Caps  Take 1,000 Units by mouth daily.     warfarin 4 MG tablet  Commonly known as:  COUMADIN  Take one-half tablet (2mg ) daily        Health Maintenance  Topic Date Due  . ZOSTAVAX  01/26/1997  . PNA vac Low Risk Adult (2 of 2 - PPSV23) 03/27/2015  . INFLUENZA VACCINE  10/12/2015  . TETANUS/TDAP  08/15/2024  . DEXA SCAN  Completed    Physical Exam: Filed Vitals:   01/11/15 1441  BP: 106/66  Pulse: 55  Temp: 98 F (36.7 C)  TempSrc: Oral  Resp: 20  Height: 5\' 7"   (1.702 m)  Weight: 128 lb 9.6 oz (58.333 kg)  SpO2: 97%   Body mass index is 20.14 kg/(m^2). Physical Exam  Constitutional: She is oriented to person, place, and time.  Chronically ill-appearing white female  Cardiovascular:  Loletha Grayer, regular  Pulmonary/Chest: Effort normal and breath sounds normal. No respiratory distress.  Abdominal: Soft. Bowel sounds are normal.  Musculoskeletal: She exhibits edema and tenderness.  Ambulates with cane, limping on left knee; left knee still showing some mild erythema, clear increased warmth and tenderness vs. right  Neurological: She is alert and oriented to person, place, and time.    Labs reviewed: Basic Metabolic Panel:  Recent Labs  03/23/14 0854  08/24/14 1119  10/29/14 1903  11/26/14 1528 12/14/14 1041 12/25/14 1346  NA 141  < > 143  < > 140  < > 142 140 139  K 3.7  < > 4.0  < > 3.7  < > 4.9 3.2* 4.1  CL 100  < > 101  < > 105  < > 103 96* 99  CO2 26  < > 25  < > 30  < > 26 28 26   GLUCOSE 74  < > 102*  < > 93  < > 94 96 98  BUN 22  < > 26  < > 19  < > 27* 41* 32*  CREATININE 0.88  < > 0.87  < > 1.14*  < > 0.92 0.92 0.87  CALCIUM 9.4  < > 9.3  < >  8.5*  < > 9.8 9.4 9.2  MG  --   --   --   --  2.3  --   --   --   --   PHOS  --   --   --   --  3.7  --   --   --   --   TSH 2.180  --  0.457  --  0.851  --   --   --   --   < > = values in this interval not displayed. Liver Function Tests:  Recent Labs  10/30/14 0422 12/14/14 1041 12/25/14 1346  AST 21 37 41*  ALT 10* 13 15  ALKPHOS 118 118* 114  BILITOT 0.7 0.4 0.4  PROT 4.9* 6.0 5.9*  ALBUMIN 2.8* 4.0 4.0   No results for input(s): LIPASE, AMYLASE in the last 8760 hours. No results for input(s): AMMONIA in the last 8760 hours. CBC:  Recent Labs  10/29/14 1903 10/30/14 0422 11/26/14 1528 12/14/14 1041 12/25/14 1346  WBC 8.8 6.8 7.1 6.0 7.0  NEUTROABS 6.8  --  4.7  --  5.1  HGB 8.6* 8.6* 11.2*  --   --   HCT 28.0* 27.1* 35.0* 36.5 40.0  MCV 92.1 93.4 91.9  --   --     PLT 284 288 280  --   --    Lipid Panel:  Recent Labs  03/23/14 0854  CHOL 239*  HDL 86  LDLCALC 130*  TRIG 115  CHOLHDL 2.8    Assessment/Plan 1. Infection of prosthetic left knee joint ([place]) -continues to be concerning -seems she is having more pain, warmth and difficulty ambulating on left knee since keflex was discontinued -will check some basic labs and send report to Dr. Drucilla Schmidt and Dr. Alvan Dame  - refilled (VIBRA-TABS) 100 MG tablet; Take 1 tablet (100 mg total) by mouth 2 (two) times daily.  Dispense: 60 tablet; Refill: 5  2. Frequent loose stools -improved with stopping keflex, but knee infection seems it's resurfaced, check labs to confirm  3. Ulcerative colitis, chronic, without complications ([place]) -still some loose stools off her immune modulator due to infection  4. Chronic venous stasis dermatitis of both lower extremities - furosemide (LASIX) 40 MG tablet; Take 1 tablet (40 mg total) by mouth daily.  Dispense: 30 tablet; Refill: 3 was renewed and she was encouraged to continue to elevate feet at rest and avoid high sodium foods -she does not seem to like to wear her compression hose  5. Paroxysmal atrial fibrillation ([facility]) -now in sinus bradycardia, cont metoprolol at reduced dose and coumadin  6. Long term (current) use of anticoagulants -continues on coumadin being managed by Tivis Ringer here in our office  7. Iatrogenic hypotension -due to metoprolol and relative dehydration  8. Sinus bradycardia on ECG -will reduce metroprolol dose due to bradycardia and weakness/lightheadedness    Labs/tests ordered:   Orders Placed This Encounter  Procedures  . Sedimentation Rate  . C-reactive Protein  . CBC with Differential/Platelet  . Basic metabolic panel  . EKG 12-Lead    Next appt:  01/18/2015 with [facility] for INR, f/u with me in one month and Dr. Drucilla Schmidt in 2 wks, Dr. Alvan Dame ASAP   Zyana Amaro L. Toneka Fullen, D.O. [facility] [address], [place] [address] Cell Phone (Mon-Fri 8am-5pm):  [phone] On Call:  [phone] & follow prompts after 5pm & weekends Office Phone:  [phone] Office Fax:  [phone]

## 2015-01-12 LAB — CBC WITH DIFFERENTIAL/PLATELET
Basophils Absolute: 0 10*3/uL (ref 0.0–0.2)
Basos: 0 %
EOS (ABSOLUTE): 0 10*3/uL (ref 0.0–0.4)
Eos: 0 %
Hematocrit: 37.4 % (ref 34.0–46.6)
Hemoglobin: 12.3 g/dL (ref 11.1–15.9)
Immature Grans (Abs): 0 10*3/uL (ref 0.0–0.1)
Immature Granulocytes: 0 %
Lymphocytes Absolute: 0.8 10*3/uL (ref 0.7–3.1)
Lymphs: 9 %
MCH: 28.8 pg (ref 26.6–33.0)
MCHC: 32.9 g/dL (ref 31.5–35.7)
MCV: 88 fL (ref 79–97)
Monocytes Absolute: 0.8 10*3/uL (ref 0.1–0.9)
Monocytes: 9 %
Neutrophils Absolute: 7.3 10*3/uL — ABNORMAL HIGH (ref 1.4–7.0)
Neutrophils: 82 %
Platelets: 221 10*3/uL (ref 150–379)
RBC: 4.27 x10E6/uL (ref 3.77–5.28)
RDW: 16 % — ABNORMAL HIGH (ref 12.3–15.4)
WBC: 8.9 10*3/uL (ref 3.4–10.8)

## 2015-01-12 LAB — BASIC METABOLIC PANEL
BUN/Creatinine Ratio: 39 — ABNORMAL HIGH (ref 11–26)
BUN: 36 mg/dL — ABNORMAL HIGH (ref 8–27)
CO2: 26 mmol/L (ref 18–29)
Calcium: 9.1 mg/dL (ref 8.7–10.3)
Chloride: 99 mmol/L (ref 97–106)
Creatinine, Ser: 0.93 mg/dL (ref 0.57–1.00)
GFR calc Af Amer: 69 mL/min/{1.73_m2} (ref >59)
GFR calc non Af Amer: 59 mL/min/{1.73_m2} — ABNORMAL LOW (ref >59)
Glucose: 101 mg/dL — ABNORMAL HIGH (ref 65–99)
Potassium: 3 mmol/L — ABNORMAL LOW (ref 3.5–5.2)
Sodium: 142 mmol/L (ref 136–144)

## 2015-01-12 LAB — SEDIMENTATION RATE: Sed Rate: 35 mm/hr (ref 0–40)

## 2015-01-12 LAB — C-REACTIVE PROTEIN: CRP: 227.4 mg/L — ABNORMAL HIGH (ref 0.0–4.9)

## 2015-01-12 NOTE — Progress Notes (Signed)
Thanks [name]!

## 2015-01-18 ENCOUNTER — Encounter: Payer: Self-pay | Admitting: Pharmacotherapy

## 2015-01-18 ENCOUNTER — Other Ambulatory Visit: Payer: Self-pay

## 2015-01-18 ENCOUNTER — Ambulatory Visit (INDEPENDENT_AMBULATORY_CARE_PROVIDER_SITE_OTHER): Payer: Medicare Other | Admitting: Pharmacotherapy

## 2015-01-18 VITALS — BP 120/58 | HR 57 | Temp 98.2°F | Resp 20 | Ht 67.0 in | Wt 126.6 lb

## 2015-01-18 DIAGNOSIS — M62838 Other muscle spasm: Secondary | ICD-10-CM

## 2015-01-18 DIAGNOSIS — R141 Gas pain: Secondary | ICD-10-CM

## 2015-01-18 DIAGNOSIS — Z7901 Long term (current) use of anticoagulants: Secondary | ICD-10-CM

## 2015-01-18 DIAGNOSIS — I480 Paroxysmal atrial fibrillation: Secondary | ICD-10-CM | POA: Diagnosis not present

## 2015-01-18 LAB — POCT INR: INR: 2.5

## 2015-01-18 MED ORDER — DIAZEPAM 5 MG PO TABS: 5.0000 mg | ORAL_TABLET | Freq: Four times a day (QID) | ORAL | Status: DC | PRN

## 2015-01-18 MED ORDER — SIMETHICONE 125 MG PO TABS: 1.0000 | ORAL_TABLET | Freq: Three times a day (TID) | ORAL | Status: DC | PRN

## 2015-01-18 NOTE — Patient Instructions (Signed)
INR 2.5 Continue Coumadin 2mg  daily (1/2 tablet)

## 2015-01-18 NOTE — Telephone Encounter (Signed)
Was sent by cathy she did not know it did not go through pharmacy having trouble receiving electronically faxed over.

## 2015-01-18 NOTE — Progress Notes (Signed)
   Subjective:    Patient ID: [name], female    DOB: 1934/09/02, 80 y.o.   MRN: [id number]  HPI  Last INR was high at 4.0 due to interaction with Flagyl. Coumadin was reduced to 2mg  daily. Denies missed doses. Denies unusual bleeding or bruising. Denies CP, palpitations, falls Consistent with vitamin K intake    Review of Systems  HENT: Negative for nosebleeds.   Cardiovascular: Negative for chest pain and palpitations.  Genitourinary: Negative for hematuria.  Hematological: Bruises/bleeds easily.       Objective:   Physical Exam  Constitutional: She is oriented to person, place, and time. She appears well-developed and well-nourished.  HENT:  Right Ear: External ear normal.  Left Ear: External ear normal.  Cardiovascular: Normal rate, regular rhythm and normal heart sounds.   Pulmonary/Chest: Effort normal and breath sounds normal.  Neurological: She is alert and oriented to person, place, and time.  Skin: Skin is warm and dry.  Psychiatric: She has a normal mood and affect. Her behavior is normal. Judgment and thought content normal.  Vitals reviewed.   BP:  120/58  HR: 57  Wt: 126lb INR 2.5       Assessment & Plan:  1.  INR at goal 2-3 2.  Continue Coumadin 2mg  daily 3.  INR in 4 weeks.

## 2015-01-19 ENCOUNTER — Telehealth: Payer: Self-pay

## 2015-01-19 NOTE — Telephone Encounter (Signed)
Patient left a message that her medication had not been sent yesterday to pharmacy due to they were having trouble with there system, called spoke with pharmacist  medication sent.

## 2015-01-23 ENCOUNTER — Encounter: Payer: Self-pay | Admitting: Internal Medicine

## 2015-01-27 ENCOUNTER — Encounter: Payer: Self-pay | Admitting: Infectious Disease

## 2015-01-27 ENCOUNTER — Ambulatory Visit (INDEPENDENT_AMBULATORY_CARE_PROVIDER_SITE_OTHER): Payer: Medicare Other | Admitting: Infectious Disease

## 2015-01-27 VITALS — BP 136/68 | HR 73 | Temp 97.5°F | Wt 122.0 lb

## 2015-01-27 DIAGNOSIS — M05711 Rheumatoid arthritis with rheumatoid factor of right shoulder without organ or systems involvement: Secondary | ICD-10-CM | POA: Diagnosis not present

## 2015-01-27 DIAGNOSIS — K5190 Ulcerative colitis, unspecified, without complications: Secondary | ICD-10-CM

## 2015-01-27 DIAGNOSIS — Z96652 Presence of left artificial knee joint: Secondary | ICD-10-CM | POA: Diagnosis not present

## 2015-01-27 DIAGNOSIS — T8454XA Infection and inflammatory reaction due to internal left knee prosthesis, initial encounter: Secondary | ICD-10-CM

## 2015-01-27 DIAGNOSIS — R197 Diarrhea, unspecified: Secondary | ICD-10-CM | POA: Insufficient documentation

## 2015-01-27 HISTORY — DX: Diarrhea, unspecified: R19.7

## 2015-01-27 NOTE — Progress Notes (Signed)
Chief complaint: Knee is swollen and "my infection is back"  Subjective:    Patient ID: [name], female    DOB: 1935-03-08, 80 y.o.   MRN: [id number]  HPI   80 y.o. female with hx of RA, UC, hypothyroidism, on humara (on hold), pred 5 mg daily, and celecoxib who underwent left TKA in early April 2016. Post operative did well initially with exception to repeated bouts of lowe extremity ulcers and cellulitis to left leg. She had cellulitis treated with a course of bactrim in June as an out patient, followed by having to be admitted in mid July for worsening cellulitis, treated initially with IV clindamycin then transitioned to orals for a total of 10 days. She suffered discomfort with swallowing associated with oral clindamycin (possible pill esophagitis) She had developed worsening left knee pain and swelling.she was seen in dr. Aurea Graff office initially for evaluation of her knee. She underwent arthrocentesis which revealed 24,000 WBC. due to recent bouts of cellulitis from a leg wound, there is the thought that she may have secondarily infected left total knee arthroplasty. she underwent an I&D and poly exchange with intent to treat as an early PJI with 6 weeks of IV antibiotics followed by oral antibiotic to finish 6 month course of treatment. Cultures from her knee joint never yielded an organisms and she has remained on IV vancomycin with significant improvement in her knee pain. After she finished her IV vancomycin and we laced her on oral doxycycline and cephalexin. While on these antibiotics she developed worsening diarrhea and there was concern for C. difficile colitis. She tested negative for C. difficile colitis after stopping her cephalexin her diarrhea improved. Her inflammatory markers showed an encouraging sedimentation rate but her C-reactive protein was above 200. She saw Dr. Alvan Dame who aspirated her joint indeterminate she had recurrent infection. No do not have access to the  cell count differential cultures or notes from Dr. Aurea Graff office but will obtain this.  She is scheduled for removal of her prosthesis and all of its components in December on December 19.  I've counseled her to stop her doxycycline December 1 to increase yield on cultures from the operating room on December 19.  Past Medical History  Diagnosis Date  . Ulcerative colitis ([place])   . Hypothyroidism   . Hypertension     pcp   dr reed   [facility]  . Pneumonia 2008  . Chronic bronchitis ([place]) 10/31/2011    "prone to it; I have sinus/bronchitis problem probably q yr"  . Multiple skin tears 10/31/2011    "get them very easily; my skin is thin and I bruise easily"  . Chronic back pain     "my entire spine"  . Edema   . Other and unspecified hyperlipidemia   . Encounter for long-term (current) use of other medications   . Senile osteoporosis   . Spinal stenosis, unspecified region other than cervical   . Unspecified glaucoma   . Hypothyroidism   . Complication of anesthesia 2009    htn with block- for hand surgery   . Anxiety     sometimes   . Rheumatoid arthritis(714.0)     degenerative disc disease   . Swelling of right upper extremity 11/26/2014   Past Surgical History  Procedure Laterality Date  . Back surgery    . Sinus surgery with instatrak    . Thyroidectomy, partial  1997  . Posterior fusion lumbar spine  10/31/2011  L2-3; 3-4  . Tonsillectomy      "when I was a small child"  . Functional endoscopic sinus surgery  1988  . Posterior fusion lumbar spine  11/2009    L3-4;  L4-5  . Joint replacement  2009; 2006    joints 2 fingers,rt hand; joint left thumb  . Spine surgery    . Posterior lumbar fusion N/A 08/05/13    T1, T2  . Eye surgery    . Anterior cervical decomp/discectomy fusion N/A 02/17/2014    Procedure: CERVICAL FOUR TO FIVE ANTERIOR CERVICAL DECOMPRESSION/DISCECTOMY FUSION 1 LEVEL;  Surgeon: [name], MD;  Location: [facility];  Service:  Neurosurgery;  Laterality: N/A;  C4-5 Anterior cervical decompression/diskectomy/fusion  . Cataract extraction Left 11/11/2013  . Cataract extraction Right 12/30/2013  . Total knee arthroplasty Left 06/16/2014    Procedure: TOTAL KNEE ARTHROPLASTY;  Surgeon: Paralee Cancel, MD;  Location: [facility];  Service: Orthopedics;  Laterality: Left;  . I&d knee with poly exchange Left 10/13/2014    Procedure: INCISION AND DRAINAGE LEFT TOTAL KNEE WITH POLY EXCHANGE;  Surgeon: Paralee Cancel, MD;  Location: [facility];  Service: Orthopedics;  Laterality: Left;   Family History  Problem Relation Age of Onset  . Heart disease Mother   . Cancer Father     lung   Social History  Substance Use Topics  . Smoking status: Current Every Day Smoker -- 0.25 packs/day for 50 years    Types: Cigarettes  . Smokeless tobacco: Never Used     Comment: 10/31/2011  offered smoking cessation materials; pt declines; "I puff; I've never really inhaled"  . Alcohol Use: 2.4 oz/week    2 Glasses of wine, 2 Cans of beer per week     Comment: 10/31/2011 "1 beers/wine with dinner daily ; an occasional scotch"   No Known Allergies   Current outpatient prescriptions:  .  Amino Acids-Protein Hydrolys (FEEDING SUPPLEMENT, PRO-STAT SUGAR FREE 64,) LIQD, Patient is now taking protein bars, Disp: , Rfl:  .  balsalazide (COLAZAL) 750 MG capsule, Take 2,250 mg by mouth 2 (two) times daily. , Disp: , Rfl:  .  Calcium Carb-Cholecalciferol (CALCIUM + D3) 600-200 MG-UNIT TABS, Take 1 tablet by mouth daily with breakfast. , Disp: , Rfl:  .  CELEBREX 200 MG capsule, Take 200 mg by mouth 2 (two) times daily. , Disp: , Rfl:  .  Cholecalciferol (VITAMIN D3) 1000 UNITS CAPS, Take 1,000 Units by mouth daily., Disp: , Rfl:  .  diazepam (VALIUM) 5 MG tablet, Take 1 tablet (5 mg total) by mouth every 6 (six) hours as needed for muscle spasms., Disp: 30 tablet, Rfl: 0 .  docusate sodium (COLACE) 100 MG capsule, Take 100 mg by mouth daily as needed for mild  constipation., Disp: , Rfl:  .  estradiol (ESTRACE) 0.5 MG tablet, Take 0.5 mg by mouth at bedtime. , Disp: , Rfl:  .  ferrous sulfate 325 (65 FE) MG tablet, Take 1 tablet (325 mg total) by mouth daily with breakfast., Disp: , Rfl: 3 .  folic acid (FOLVITE) 1 MG tablet, Take 2 mg by mouth daily., Disp: , Rfl:  .  furosemide (LASIX) 40 MG tablet, Take 1 tablet (40 mg total) by mouth daily., Disp: 30 tablet, Rfl: 3 .  guaiFENesin (MUCINEX) 600 MG 12 hr tablet, Take 1,200 mg by mouth daily as needed (sinus relief). , Disp: , Rfl:  .  latanoprost (XALATAN) 0.005 % ophthalmic solution, Place 1 drop into both eyes  at bedtime., Disp: , Rfl:  .  levothyroxine (SYNTHROID) 88 MCG tablet, Take 88 mcg by mouth daily before breakfast. DAW, DO NOT SUBSTITUTE WITH GENERIC, Disp: , Rfl:  .  medroxyPROGESTERone (PROVERA) 2.5 MG tablet, Take 2.5 mg by mouth at bedtime. , Disp: , Rfl:  .  metoprolol succinate (TOPROL-XL) 25 MG 24 hr tablet, Take 1 tablet (25 mg total) by mouth 2 (two) times daily. Take with or immediately following a meal., Disp: 60 tablet, Rfl: 3 .  Multiple Vitamin (MULTIVITAMIN WITH MINERALS) TABS, Take 1 tablet by mouth daily., Disp: , Rfl:  .  oxyCODONE (OXY IR/ROXICODONE) 5 MG immediate release tablet, Take 1 tablet (5 mg total) by mouth 2 (two) times daily as needed for severe pain. 1 by mouth every 4 hours as needed for moderate pain, 2 by mouth as needed for severe pain, Disp: 60 tablet, Rfl: 0 .  oxyCODONE-acetaminophen (PERCOCET/ROXICET) 5-325 MG tablet, Take 1 tablet by mouth every 6 (six) hours as needed. for pain, Disp: , Rfl: 0 .  polyethylene glycol (MIRALAX / GLYCOLAX) packet, Take 17 g by mouth 2 (two) times daily., Disp: 14 each, Rfl: 0 .  potassium chloride SA (K-DUR,KLOR-CON) 20 MEQ tablet, Take 1 tablet (20 mEq total) by mouth daily., Disp: 30 tablet, Rfl: 3 .  predniSONE (DELTASONE) 1 MG tablet, Take 3 tablets (3 mg total) by mouth daily with breakfast. (Patient taking  differently: Take 5 mg by mouth daily with breakfast. ), Disp: 90 tablet, Rfl: 1 .  Simethicone 125 MG TABS, Take 1 tablet (125 mg total) by mouth every 8 (eight) hours as needed., Disp: 90 tablet, Rfl: 3 .  warfarin (COUMADIN) 4 MG tablet, Take one-half tablet (2mg ) daily, Disp: 30 tablet, Rfl: 3    Review of Systems  Constitutional: Negative for fever, chills, diaphoresis, activity change, appetite change, fatigue and unexpected weight change.  HENT: Negative for congestion, rhinorrhea, sinus pressure, sneezing, sore throat and trouble swallowing.   Eyes: Negative for photophobia and visual disturbance.  Respiratory: Negative for cough, chest tightness, shortness of breath, wheezing and stridor.   Cardiovascular: Negative for chest pain, palpitations and leg swelling.  Gastrointestinal: Negative for nausea, vomiting, abdominal pain, diarrhea, constipation, blood in stool, abdominal distention and anal bleeding.  Genitourinary: Negative for dysuria, hematuria, flank pain and difficulty urinating.  Musculoskeletal: Positive for myalgias and joint swelling. Negative for back pain, arthralgias and gait problem.  Skin: Positive for color change and wound. Negative for pallor and rash.  Neurological: Negative for dizziness, tremors, weakness and light-headedness.  Hematological: Negative for adenopathy. Does not bruise/bleed easily.  Psychiatric/Behavioral: Negative for behavioral problems, confusion, sleep disturbance, dysphoric mood, decreased concentration and agitation.       Objective:   Physical Exam  Constitutional: She is oriented to person, place, and time. No distress.  HENT:  Head: Normocephalic and atraumatic.  Mouth/Throat: No oropharyngeal exudate.  Eyes: Conjunctivae and EOM are normal. No scleral icterus.  Neck: Normal range of motion. Neck supple.  Cardiovascular: Normal rate and regular rhythm.   Pulmonary/Chest: Effort normal. No respiratory distress. She has no wheezes.   Abdominal: She exhibits no distension.  Musculoskeletal: She exhibits no edema or tenderness.  Neurological: She is alert and oriented to person, place, and time. She exhibits normal muscle tone. Coordination normal.  Skin: Skin is warm and dry. She is not diaphoretic.  Psychiatric: She has a normal mood and affect. Her behavior is normal. Judgment and thought content normal.    Left knee  With effusion      Assessment & Plan:   Prosthetic joint infection sp I&D and exchange sp IV vancomycin to oral doxy and keflex now with recurrence on therapy. I agree she needs her prosthetic knee completely removed and the area thoroughly washed out. I would like to stop her antibiotics on December 1 to maximize intraoperative cultures. We also need anesthesiology and everyone else to not give her antibiotics prior to surgery. If she worsens clinically and shows evidence of systemic illness L admit her to the hospital get blood cultures observe her and consult orthopedic surgery urgently for a more expedited removal of her prosthesis   RA and UC: hold anti-TNF therapy while recovering from her infection over next half year    Diarrhea: Resolved off Keflex. I've counseled her not to use probiotics given this is MR demise and that probiotics have not been found to be effective at reducing incidence of antibiotic-induced diarrhea or clustered into the seal colitis and in fact pose a risk for disseminated fungemia or bacteremia.  I spent greater than 40 minutes with the patient including greater than 50% of time in face to face counsel of the patient of her PJI, lRA and UC, diarrhea and in coordination of her care.

## 2015-01-30 ENCOUNTER — Other Ambulatory Visit: Payer: Self-pay | Admitting: Internal Medicine

## 2015-02-02 ENCOUNTER — Other Ambulatory Visit: Payer: Self-pay | Admitting: Internal Medicine

## 2015-02-15 ENCOUNTER — Other Ambulatory Visit: Payer: Self-pay | Admitting: *Deleted

## 2015-02-15 ENCOUNTER — Ambulatory Visit (INDEPENDENT_AMBULATORY_CARE_PROVIDER_SITE_OTHER): Payer: Medicare Other | Admitting: Pharmacotherapy

## 2015-02-15 ENCOUNTER — Encounter: Payer: Self-pay | Admitting: Pharmacotherapy

## 2015-02-15 VITALS — BP 120/70 | HR 59 | Temp 98.4°F | Resp 20 | Ht 67.0 in | Wt 123.8 lb

## 2015-02-15 DIAGNOSIS — Z7901 Long term (current) use of anticoagulants: Secondary | ICD-10-CM

## 2015-02-15 DIAGNOSIS — I480 Paroxysmal atrial fibrillation: Secondary | ICD-10-CM

## 2015-02-15 DIAGNOSIS — M069 Rheumatoid arthritis, unspecified: Secondary | ICD-10-CM | POA: Diagnosis not present

## 2015-02-15 LAB — POCT INR: INR: 2.3

## 2015-02-15 MED ORDER — PREDNISONE 1 MG PO TABS: 5.0000 mg | ORAL_TABLET | Freq: Every day | ORAL | Status: DC

## 2015-02-15 MED ORDER — OXYCODONE HCL 5 MG PO TABS: 5.0000 mg | ORAL_TABLET | Freq: Two times a day (BID) | ORAL | Status: DC | PRN

## 2015-02-15 NOTE — Progress Notes (Signed)
   Subjective:    Patient ID: [name], female    DOB: 1934/05/09, 80 y.o.   MRN: [id number]  HPI  Last INR on 01/18/15 was OK at 2.5 Current Coumadin dose is 2mg  QD. Denies missed doses. Denies unusual bleeding or bruising. Denies CP, palpitations, falls Consistent with vitamin K intake.  To have knee surgery 03/01/15 to take out infected hardware.   Review of Systems  HENT: Negative for nosebleeds.   Respiratory: Negative for shortness of breath.   Cardiovascular: Negative for chest pain and palpitations.  Genitourinary: Negative for hematuria.  Hematological: Bruises/bleeds easily.       Objective:   Physical Exam  Constitutional: She is oriented to person, place, and time. She appears well-developed and well-nourished.  HENT:  Right Ear: External ear normal.  Left Ear: External ear normal.  Cardiovascular: Normal rate, regular rhythm and normal heart sounds.   Pulmonary/Chest: Effort normal and breath sounds normal.  Neurological: She is alert and oriented to person, place, and time.  Skin: Skin is warm and dry.  Psychiatric: She has a normal mood and affect. Her behavior is normal. Judgment and thought content normal.  Vitals reviewed.   INR 2.3 BP:  120/70  HR:  59  Wt: 123lb      Assessment & Plan:  1.  INR at goal 2-3 2.  Continue Coumadin 2mg  QD 3.  INR in 1 month

## 2015-02-15 NOTE — Patient Instructions (Signed)
INR 2.3 Continue Coumadin 2mg  daily

## 2015-02-23 ENCOUNTER — Encounter (HOSPITAL_COMMUNITY)
Admission: RE | Admit: 2015-02-23 | Discharge: 2015-02-23 | Disposition: A | Discharge status: Home / Self Care | Payer: Medicare Other | Source: Ambulatory Visit | Attending: Orthopedic Surgery | Admitting: Orthopedic Surgery

## 2015-02-23 ENCOUNTER — Telehealth: Payer: Self-pay | Admitting: *Deleted

## 2015-02-23 ENCOUNTER — Ambulatory Visit (HOSPITAL_COMMUNITY)
Admission: RE | Admit: 2015-02-23 | Discharge: 2015-02-23 | Disposition: A | Discharge status: Home / Self Care | Payer: Medicare Other | Source: Ambulatory Visit | Attending: Anesthesiology | Admitting: Anesthesiology

## 2015-02-23 ENCOUNTER — Encounter (HOSPITAL_COMMUNITY): Payer: Self-pay

## 2015-02-23 DIAGNOSIS — R05 Cough: Secondary | ICD-10-CM | POA: Insufficient documentation

## 2015-02-23 DIAGNOSIS — R938 Abnormal findings on diagnostic imaging of other specified body structures: Secondary | ICD-10-CM | POA: Insufficient documentation

## 2015-02-23 DIAGNOSIS — Z01818 Encounter for other preprocedural examination: Secondary | ICD-10-CM | POA: Insufficient documentation

## 2015-02-23 DIAGNOSIS — R918 Other nonspecific abnormal finding of lung field: Secondary | ICD-10-CM | POA: Insufficient documentation

## 2015-02-23 DIAGNOSIS — Z01812 Encounter for preprocedural laboratory examination: Secondary | ICD-10-CM | POA: Diagnosis not present

## 2015-02-23 DIAGNOSIS — R9389 Abnormal findings on diagnostic imaging of other specified body structures: Secondary | ICD-10-CM

## 2015-02-23 HISTORY — DX: Personal history of (healed) traumatic fracture: Z87.81

## 2015-02-23 HISTORY — DX: Repeated falls: R29.6

## 2015-02-23 HISTORY — DX: Cardiac arrhythmia, unspecified: I49.9

## 2015-02-23 HISTORY — DX: Personal history of other medical treatment: Z92.89

## 2015-02-23 HISTORY — DX: Depression, unspecified: F32.A

## 2015-02-23 HISTORY — DX: Anemia, unspecified: D64.9

## 2015-02-23 HISTORY — DX: Peripheral vascular disease, unspecified: I73.9

## 2015-02-23 HISTORY — DX: Unspecified hearing loss, unspecified ear: H91.90

## 2015-02-23 HISTORY — DX: Dizziness and giddiness: R42

## 2015-02-23 HISTORY — DX: Major depressive disorder, single episode, unspecified: F32.9

## 2015-02-23 HISTORY — DX: Unspecified fall, initial encounter: W19.XXXA

## 2015-02-23 LAB — URINALYSIS, ROUTINE W REFLEX MICROSCOPIC
BILIRUBIN URINE: NEGATIVE
GLUCOSE, UA: NEGATIVE mg/dL
Hgb urine dipstick: NEGATIVE
KETONES UR: NEGATIVE mg/dL
Leukocytes, UA: NEGATIVE
Nitrite: NEGATIVE
PH: 6.5 (ref 5.0–8.0)
Protein, ur: NEGATIVE mg/dL
Specific Gravity, Urine: 1.02 (ref 1.005–1.030)

## 2015-02-23 LAB — BASIC METABOLIC PANEL
ANION GAP: 12 (ref 5–15)
BUN: 41 mg/dL — ABNORMAL HIGH (ref 6–20)
CALCIUM: 9.1 mg/dL (ref 8.9–10.3)
CO2: 30 mmol/L (ref 22–32)
Chloride: 98 mmol/L — ABNORMAL LOW (ref 101–111)
Creatinine, Ser: 1.03 mg/dL — ABNORMAL HIGH (ref 0.44–1.00)
GFR, EST AFRICAN AMERICAN: 59 mL/min — AB (ref >60)
GFR, EST NON AFRICAN AMERICAN: 51 mL/min — AB (ref >60)
Glucose, Bld: 114 mg/dL — ABNORMAL HIGH (ref 65–99)
Potassium: 2.7 mmol/L — CL (ref 3.5–5.1)
Sodium: 140 mmol/L (ref 135–145)

## 2015-02-23 LAB — PROTIME-INR
INR: 1.62 — AB (ref 0.00–1.49)
PROTHROMBIN TIME: 19.2 s — AB (ref 11.6–15.2)

## 2015-02-23 LAB — SURGICAL PCR SCREEN
MRSA, PCR: NEGATIVE
Staphylococcus aureus: NEGATIVE

## 2015-02-23 LAB — CBC
HCT: 36.1 % (ref 36.0–46.0)
HEMOGLOBIN: 12 g/dL (ref 12.0–15.0)
MCH: 28.6 pg (ref 26.0–34.0)
MCHC: 33.2 g/dL (ref 30.0–36.0)
MCV: 86.2 fL (ref 78.0–100.0)
Platelets: 397 10*3/uL (ref 150–400)
RBC: 4.19 MIL/uL (ref 3.87–5.11)
RDW: 15.8 % — ABNORMAL HIGH (ref 11.5–15.5)
WBC: 8.9 10*3/uL (ref 4.0–10.5)

## 2015-02-23 LAB — APTT: APTT: 31 s (ref 24–37)

## 2015-02-23 NOTE — Telephone Encounter (Signed)
Dr. Aurea Graff office notified and phone note faxed. Myrtis Hopping

## 2015-02-23 NOTE — Patient Instructions (Signed)
[name]  02/23/2015   Your procedure is scheduled on: Monday March 01, 2015   Report to [facility]  Entrance take [place]  elevators to 3rd floor to  [place] at 10:00 AM.  Call this number if you have problems the morning of surgery [phone]   Remember: ONLY 1 PERSON MAY GO WITH YOU TO SHORT STAY TO GET  READY MORNING OF [place].  Do not eat food or drink liquids :After Midnight.     Take these medicines the morning of surgery with A SIP OF WATER: Diazepam (Valium) if needed; Levothyroxine; Metoprolol; Oxycodone if needed; Oxycodone-Acetaminophen if needed; May use eye drops if needed; Prednisone; Colazal                                You may not have any metal on your body including hair pins and              piercings  Do not wear jewelry, make-up, lotions, powders or perfumes, deodorant             Do not wear nail polish.  Do not shave  48 hours prior to surgery.             Do not bring valuables to the hospital. [place].  Contacts, dentures or bridgework may not be worn into surgery.  Leave suitcase in the car. After surgery it may be brought to your room.                Please read over the following fact sheets you were given:BLOOD TRANSFUSION INFORMATION SHEET; [place]  _____________________________________________________________________             [facility] - Preparing for Surgery Before surgery, you can play an important role.  Because skin is not sterile, your skin needs to be as free of germs as possible.  You can reduce the number of germs on your skin by washing with CHG (chlorahexidine gluconate) soap before surgery.  CHG is an antiseptic cleaner which kills germs and bonds with the skin to continue killing germs even after washing. Please DO NOT use if you have an allergy to CHG or antibacterial soaps.  If your skin becomes reddened/irritated  stop using the CHG and inform your nurse when you arrive at Short Stay. Do not shave (including legs and underarms) for at least 48 hours prior to the first CHG shower.  You may shave your face/neck. Please follow these instructions carefully:  1.  Shower with CHG Soap the night before surgery and the  morning of Surgery.  2.  If you choose to wash your hair, wash your hair first as usual with your  normal  shampoo.  3.  After you shampoo, rinse your hair and body thoroughly to remove the  shampoo.                           4.  Use CHG as you would any other liquid soap.  You can apply chg directly  to the skin and wash  Gently with a scrungie or clean washcloth.  5.  Apply the CHG Soap to your body ONLY FROM THE NECK DOWN.   Do not use on face/ open                           Wound or open sores. Avoid contact with eyes, ears mouth and genitals (private parts).                       Wash face,  Genitals (private parts) with your normal soap.             6.  Wash thoroughly, paying special attention to the area where your surgery  will be performed.  7.  Thoroughly rinse your body with warm water from the neck down.  8.  DO NOT shower/wash with your normal soap after using and rinsing off  the CHG Soap.                9.  Pat yourself dry with a clean towel.            10.  Wear clean pajamas.            11.  Place clean sheets on your bed the night of your first shower and do not  sleep with pets. Day of Surgery : Do not apply any lotions/deodorants the morning of surgery.  Please wear clean clothes to the hospital/surgery center.  FAILURE TO FOLLOW THESE INSTRUCTIONS MAY RESULT IN THE CANCELLATION OF YOUR SURGERY PATIENT SIGNATURE_________________________________  NURSE SIGNATURE__________________________________  ________________________________________________________________________   [name]  An incentive spirometer is a tool that can help keep your  lungs clear and active. This tool measures how well you are filling your lungs with each breath. Taking long deep breaths may help reverse or decrease the chance of developing breathing (pulmonary) problems (especially infection) following:  A long period of time when you are unable to move or be active. BEFORE THE PROCEDURE   If the spirometer includes an indicator to show your best effort, your nurse or respiratory therapist will set it to a desired goal.  If possible, sit up straight or lean slightly forward. Try not to slouch.  Hold the incentive spirometer in an upright position. INSTRUCTIONS FOR USE   Sit on the edge of your bed if possible, or sit up as far as you can in bed or on a chair.  Hold the incentive spirometer in an upright position.  Breathe out normally.  Place the mouthpiece in your mouth and seal your lips tightly around it.  Breathe in slowly and as deeply as possible, raising the piston or the ball toward the top of the column.  Hold your breath for 3-5 seconds or for as long as possible. Allow the piston or ball to fall to the bottom of the column.  Remove the mouthpiece from your mouth and breathe out normally.  Rest for a few seconds and repeat Steps 1 through 7 at least 10 times every 1-2 hours when you are awake. Take your time and take a few normal breaths between deep breaths.  The spirometer may include an indicator to show your best effort. Use the indicator as a goal to work toward during each repetition.  After each set of 10 deep breaths, practice coughing to be sure your lungs are clear. If you have an incision (the cut made at the time of surgery),  support your incision when coughing by placing a pillow or rolled up towels firmly against it. Once you are able to get out of bed, walk around indoors and cough well. You may stop using the incentive spirometer when instructed by your caregiver.  RISKS AND COMPLICATIONS  Take your time so you do not  get dizzy or light-headed.  If you are in pain, you may need to take or ask for pain medication before doing incentive spirometry. It is harder to take a deep breath if you are having pain. AFTER USE  Rest and breathe slowly and easily.  It can be helpful to keep track of a log of your progress. Your caregiver can provide you with a simple table to help with this. If you are using the spirometer at home, follow these instructions: [place] IF:   You are having difficultly using the spirometer.  You have trouble using the spirometer as often as instructed.  Your pain medication is not giving enough relief while using the spirometer.  You develop fever of 100.5 F (38.1 C) or higher. SEEK IMMEDIATE MEDICAL CARE IF:   You cough up bloody sputum that had not been present before.  You develop fever of 102 F (38.9 C) or greater.  You develop worsening pain at or near the incision site. MAKE SURE YOU:   Understand these instructions.  Will watch your condition.  Will get help right away if you are not doing well or get worse. Document Released: 07/10/2006 Document Revised: 05/22/2011 Document Reviewed: 09/10/2006 ExitCare Patient Information 2014 Exit[employer], [place].   ________________________________________________________________________  WHAT IS A BLOOD TRANSFUSION? Blood Transfusion Information  A transfusion is the replacement of blood or some of its parts. Blood is made up of multiple cells which provide different functions.  Red blood cells carry oxygen and are used for blood loss replacement.  White blood cells fight against infection.  Platelets control bleeding.  Plasma helps clot blood.  Other blood products are available for specialized needs, such as hemophilia or other clotting disorders. BEFORE THE TRANSFUSION  Who gives blood for transfusions?   Healthy volunteers who are fully evaluated to make sure their blood is safe. This is blood bank  blood. Transfusion therapy is the safest it has ever been in the practice of medicine. Before blood is taken from a donor, a complete history is taken to make sure that person has no history of diseases nor engages in risky social behavior (examples are intravenous drug use or sexual activity with multiple partners). The donor's travel history is screened to minimize risk of transmitting infections, such as malaria. The donated blood is tested for signs of infectious diseases, such as HIV and hepatitis. The blood is then tested to be sure it is compatible with you in order to minimize the chance of a transfusion reaction. If you or a relative donates blood, this is often done in anticipation of surgery and is not appropriate for emergency situations. It takes many days to process the donated blood. RISKS AND COMPLICATIONS Although transfusion therapy is very safe and saves many lives, the main dangers of transfusion include:   Getting an infectious disease.  Developing a transfusion reaction. This is an allergic reaction to something in the blood you were given. Every precaution is taken to prevent this. The decision to have a blood transfusion has been considered carefully by your caregiver before blood is given. Blood is not given unless the benefits outweigh the risks. AFTER THE TRANSFUSION  Right after receiving a blood transfusion, you will usually feel much better and more energetic. This is especially true if your red blood cells have gotten low (anemic). The transfusion raises the level of the red blood cells which carry oxygen, and this usually causes an energy increase.  The nurse administering the transfusion will monitor you carefully for complications. HOME CARE INSTRUCTIONS  No special instructions are needed after a transfusion. You may find your energy is better. Speak with your caregiver about any limitations on activity for underlying diseases you may have. SEEK MEDICAL CARE IF:    Your condition is not improving after your transfusion.  You develop redness or irritation at the intravenous (IV) site. SEEK IMMEDIATE MEDICAL CARE IF:  Any of the following symptoms occur over the next 12 hours:  Shaking chills.  You have a temperature by mouth above 102 F (38.9 C), not controlled by medicine.  Chest, back, or muscle pain.  People around you feel you are not acting correctly or are confused.  Shortness of breath or difficulty breathing.  Dizziness and fainting.  You get a rash or develop hives.  You have a decrease in urine output.  Your urine turns a dark color or changes to pink, red, or brown. Any of the following symptoms occur over the next 10 days:  You have a temperature by mouth above 102 F (38.9 C), not controlled by medicine.  Shortness of breath.  Weakness after normal activity.  The white part of the eye turns yellow (jaundice).  You have a decrease in the amount of urine or are urinating less often.  Your urine turns a dark color or changes to pink, red, or brown. Document Released: 02/25/2000 Document Revised: 05/22/2011 Document Reviewed: 10/14/2007 [facility] Patient Information 2014 [place], [place].  _______________________________________________________________________

## 2015-02-23 NOTE — Progress Notes (Signed)
Critical lab valve CRITICAL VALUE ALERT  Critical value received:  Potassium 2.7  Date of notification:  02/23/2015  Time of notification:  3:59 pm   Critical value read back:yes  Nurse who received alert:  Nash Dimmer RN   MD notified (1st page):  Dr Alvan Dame / Kallie Locks RN in [place] - Dr Alvan Dame stated would contact pt for followup   Time of first page:  4:00 pm    Responding MD:  Dr Alvan Dame   Time MD responded:4:00 pm

## 2015-02-23 NOTE — Progress Notes (Addendum)
EKG epic 01/11/2015  ECHO epic 04/17/2014  Note per Dr Tommy Medal / ID MD on front of chart 01/27/2015  Spoke with Judeen Hammans with Dr Alvan Dame in regards to pts left lower extermity - redness, edema, tightness  Faxed copy of Dr Lucianne Lei Dam's OV note from 01/27/2015 received return call back after Judeen Hammans spoke with Dr Alvan Dame informed pt if things worsen with left lower extrremity contact Dr Tommy Medal pt verbalized understanding Also clarified with [facility] scheduler with Dr Alvan Dame in regards to celebrex - pt is to hold till after surgery pt verbalized understanding

## 2015-02-23 NOTE — Telephone Encounter (Signed)
Please have the patient CONTINUE TO STAY OFF antibiotics and ask her to tell anesthesiology to NOT give her preoperative antibiotics so we can get some CULTURES IN THE OR OFF antibiotics.   If she gets worse and needs to be seen by me or admitted for observation I NEED TO BE INVOLVED because other folks who dont know her case will end up throwing antibiotics at her and mess everything up.   She should not start on ANY abx without contacting us in ID. If she has problems after hours she can also call on call ID MD.  Not suprising to me that her leg is worse off abx.  Just needs to hold steady and get to surgery and I can then help manage her abx post surgery  She may want to call the day she is being admitted

## 2015-02-23 NOTE — Telephone Encounter (Signed)
[name] with Dr. Aurea Graff office called stating patient is having knee surgery on Monday, 03/01/15 and having a spacer placed. Patient is reporting swelling, redness and warmth to the touch. Dr. Alvan Dame wanted Dr. Tommy Medal to be aware; please advise. Call back # [phone]. [name]

## 2015-02-23 NOTE — Progress Notes (Signed)
BMP results in epic per PAT visit 02/23/2015 sent to Dr Alvan Dame (also called to Dr Alvan Dame - see critical valve note per epic)

## 2015-02-23 NOTE — Telephone Encounter (Signed)
Perfect

## 2015-02-24 NOTE — Progress Notes (Signed)
PT results in epic per PAT visit 02/23/2015 sent to Dr Alvan Dame

## 2015-02-24 NOTE — Progress Notes (Signed)
Spoke with Dr Hubbard Robinson in regards to pts H&P,CXR,EKG and ECHO results. Also made aware of note placed on surgical schedule in regards to Dr Lucianne Lei Dams request in regards to antibiotics. No orders given. Anesthesia to see pt day of surgery.

## 2015-02-25 ENCOUNTER — Ambulatory Visit (INDEPENDENT_AMBULATORY_CARE_PROVIDER_SITE_OTHER): Payer: Medicare Other | Admitting: Internal Medicine

## 2015-02-25 ENCOUNTER — Encounter: Payer: Self-pay | Admitting: Internal Medicine

## 2015-02-25 VITALS — BP 118/60 | HR 67 | Temp 98.3°F | Wt 127.0 lb

## 2015-02-25 DIAGNOSIS — I8311 Varicose veins of right lower extremity with inflammation: Secondary | ICD-10-CM

## 2015-02-25 DIAGNOSIS — Z7952 Long term (current) use of systemic steroids: Secondary | ICD-10-CM | POA: Diagnosis not present

## 2015-02-25 DIAGNOSIS — K5190 Ulcerative colitis, unspecified, without complications: Secondary | ICD-10-CM

## 2015-02-25 DIAGNOSIS — Z01818 Encounter for other preprocedural examination: Secondary | ICD-10-CM

## 2015-02-25 DIAGNOSIS — T8454XA Infection and inflammatory reaction due to internal left knee prosthesis, initial encounter: Secondary | ICD-10-CM

## 2015-02-25 DIAGNOSIS — M069 Rheumatoid arthritis, unspecified: Secondary | ICD-10-CM

## 2015-02-25 DIAGNOSIS — I480 Paroxysmal atrial fibrillation: Secondary | ICD-10-CM

## 2015-02-25 DIAGNOSIS — I8312 Varicose veins of left lower extremity with inflammation: Secondary | ICD-10-CM

## 2015-02-25 DIAGNOSIS — I872 Venous insufficiency (chronic) (peripheral): Secondary | ICD-10-CM

## 2015-02-25 NOTE — Progress Notes (Signed)
Patient ID: [name], female   DOB: 1934-06-11, 80 y.o.   MRN: [id number]   Location: [facility] Provider: Rexene Edison. Mariea Clonts, D.O., C.M.D.  Code Status: full code Goals of Care: Advanced Directive information Does patient have an advance directive?: No, Would patient like information on creating an advanced directive?: No - patient declined information  Chief Complaint  Patient presents with  . Medical Management of Chronic Issues    surgical clearance, has swelling in her left leg, check callus on bottom of the foot.    HPI: Patient is a 80 y.o. female seen in the office today for medical clearance for her surgery.  She has already had urine sample, labs, chest xray which were unremarkable and stopped her coumadin for afib 2 days ago.    She is now pointing out to me increased edema of her left leg despite daily lasix 40mg  and increased potassium dosing.  She wears compression socks, but they are not adequate and she can't get on the ones that are tight enough.  Her husband doesn't have the patience to help she reports.   She now has a callous on the metatarsal plantar region of the left foot that is draining serosanguinous fluid for the past two weeks.  She is concerned this could affect her surgery.  She notes she did call Dr. Aurea Graff office.  I informed her that we will send my note and a comment about this on the clearance paperwork.  We reviewed which meds to take and not take the morning of surgery.  I have not held her prednisone due to her risk for adrenal insufficiency with longstanding steroid use in a stressful situation.  Review of Systems:  Review of Systems  Constitutional: Positive for chills and malaise/fatigue. Negative for fever.  HENT: Positive for hearing loss.   Eyes: Negative for blurred vision.  Respiratory: Positive for cough. Negative for shortness of breath.   Cardiovascular: Negative for chest pain.  Gastrointestinal: Negative for abdominal pain,  diarrhea and constipation.  Genitourinary: Positive for frequency. Negative for dysuria.       Due to lasix  Musculoskeletal: Positive for myalgias and joint pain. Negative for falls.  Skin: Negative for rash.       Callous on plantar surface of left foot  Neurological: Negative for dizziness and weakness.  Endo/Heme/Allergies: Bruises/bleeds easily.  Psychiatric/Behavioral: Negative for depression and memory loss.    Past Medical History  Diagnosis Date  . Ulcerative colitis ([place])   . Hypothyroidism   . Hypertension     pcp   dr Curtis Uriarte   [facility]  . Pneumonia 2008  . Chronic bronchitis ([place]) 10/31/2011    "prone to it; I have sinus/bronchitis problem probably q yr"  . Multiple skin tears 10/31/2011    "get them very easily; my skin is thin and I bruise easily"  . Chronic back pain     "my entire spine"  . Edema   . Other and unspecified hyperlipidemia   . Encounter for long-term (current) use of other medications   . Senile osteoporosis   . Spinal stenosis, unspecified region other than cervical   . Unspecified glaucoma   . Hypothyroidism   . Anxiety     sometimes   . Rheumatoid arthritis(714.0)     degenerative disc disease   . Swelling of right upper extremity 11/26/2014  . Diarrhea 01/27/2015  . Complication of anesthesia 2009    htn with block- for hand surgery   .  Dysrhythmia     pt states taking coumadin for paroxysmal atrial fibrillation   . Peripheral vascular disease ([place])   . Depression   . Anemia   . History of blood transfusion   . Hard of hearing   . Dizziness   . Falls   . H/O clavicle fracture     left    Past Surgical History  Procedure Laterality Date  . Back surgery    . Sinus surgery with instatrak    . Thyroidectomy, partial  1997  . Posterior fusion lumbar spine  10/31/2011    L2-3; 3-4  . Tonsillectomy      "when I was a small child"  . Functional endoscopic sinus surgery  1988  . Posterior fusion lumbar spine  11/2009    L3-4;   L4-5  . Joint replacement  2009; 2006    joints 2 fingers,rt hand; joint left thumb  . Spine surgery    . Posterior lumbar fusion N/A 08/05/13    T1, T2  . Eye surgery    . Anterior cervical decomp/discectomy fusion N/A 02/17/2014    Procedure: CERVICAL FOUR TO FIVE ANTERIOR CERVICAL DECOMPRESSION/DISCECTOMY FUSION 1 LEVEL;  Surgeon: [name], MD;  Location: [facility];  Service: Neurosurgery;  Laterality: N/A;  C4-5 Anterior cervical decompression/diskectomy/fusion  . Cataract extraction Left 11/11/2013  . Cataract extraction Right 12/30/2013  . Total knee arthroplasty Left 06/16/2014    Procedure: TOTAL KNEE ARTHROPLASTY;  Surgeon: Paralee Cancel, MD;  Location: [facility];  Service: Orthopedics;  Laterality: Left;  . I&d knee with poly exchange Left 10/13/2014    Procedure: INCISION AND DRAINAGE LEFT TOTAL KNEE WITH POLY EXCHANGE;  Surgeon: Paralee Cancel, MD;  Location: [facility];  Service: Orthopedics;  Laterality: Left;    No Known Allergies    Medication List       This list is accurate as of: 02/25/15  3:18 PM.  Always use your most recent med list.               balsalazide 750 MG capsule  Commonly known as:  COLAZAL  Take 2,250 mg by mouth 2 (two) times daily.     Calcium + D3 600-200 MG-UNIT Tabs  Take 1 tablet by mouth daily with breakfast.     CELEBREX 200 MG capsule  Generic drug:  celecoxib  Take 200 mg by mouth 2 (two) times daily.     diazepam 5 MG tablet  Commonly known as:  VALIUM  Take 1 tablet (5 mg total) by mouth every 6 (six) hours as needed for muscle spasms.     docusate sodium 100 MG capsule  Commonly known as:  COLACE  Take 100 mg by mouth daily as needed for mild constipation.     estradiol 0.5 MG tablet  Commonly known as:  ESTRACE  Take 0.5 mg by mouth at bedtime.     feeding supplement (PRO-STAT SUGAR FREE 64) Liqd  Take 30 mLs by mouth 3 (three) times daily with meals.     ferrous sulfate 325 (65 FE) MG tablet  Take 1 tablet (325 mg total)  by mouth daily with breakfast.     folic acid 1 MG tablet  Commonly known as:  FOLVITE  Take 2 mg by mouth daily.     furosemide 40 MG tablet  Commonly known as:  LASIX  Take 1 tablet (40 mg total) by mouth daily.     guaiFENesin 600 MG 12 hr tablet  Commonly known  as:  MUCINEX  Take 600 mg by mouth daily as needed (sinus relief).     latanoprost 0.005 % ophthalmic solution  Commonly known as:  XALATAN  Place 1 drop into both eyes at bedtime.     leflunomide 20 MG tablet  Commonly known as:  ARAVA  Take 20 mg by mouth daily.     medroxyPROGESTERone 2.5 MG tablet  Commonly known as:  PROVERA  Take 2.5 mg by mouth at bedtime.     metoprolol succinate 25 MG 24 hr tablet  Commonly known as:  TOPROL-XL  Take 1 tablet (25 mg total) by mouth 2 (two) times daily. Take with or immediately following a meal.     multivitamin with minerals Tabs tablet  Take 1 tablet by mouth daily with lunch.     oxyCODONE 5 MG immediate release tablet  Commonly known as:  Oxy IR/ROXICODONE  Take 1 tablet (5 mg total) by mouth 2 (two) times daily as needed for severe pain. 1 by mouth every 4 hours as needed for moderate pain, 2 by mouth as needed for severe pain     oxyCODONE-acetaminophen 5-325 MG tablet  Commonly known as:  PERCOCET/ROXICET  Take 1 tablet by mouth every 6 (six) hours as needed. for pain     polyethylene glycol packet  Commonly known as:  MIRALAX / GLYCOLAX  Take 17 g by mouth 2 (two) times daily.     polyvinyl alcohol 1.4 % ophthalmic solution  Commonly known as:  LIQUIFILM TEARS  Place 1 drop into both eyes 2 (two) times daily as needed for dry eyes.     potassium chloride SA 20 MEQ tablet  Commonly known as:  K-DUR,KLOR-CON  Take 1 tablet (20 mEq total) by mouth daily.     predniSONE 5 MG tablet  Commonly known as:  DELTASONE  Take 5 mg by mouth daily with breakfast.     Simethicone 125 MG Tabs  Take 1 tablet (125 mg total) by mouth every 8 (eight) hours as needed.       SYNTHROID 88 MCG tablet  Generic drug:  levothyroxine  Take 88 mcg by mouth daily before breakfast. DAW, DO NOT SUBSTITUTE WITH GENERIC     Vitamin D3 1000 UNITS Caps  Take 1,000 Units by mouth daily.     warfarin 4 MG tablet  Commonly known as:  COUMADIN  Take one-half tablet (2mg ) daily        Health Maintenance  Topic Date Due  . ZOSTAVAX  01/26/1997  . PNA vac Low Risk Adult (2 of 2 - PPSV23) 03/27/2015  . INFLUENZA VACCINE  10/12/2015  . TETANUS/TDAP  08/15/2024  . DEXA SCAN  Completed    Physical Exam: Filed Vitals:   02/25/15 1517  BP: 118/60  Pulse: 67  Temp: 98.3 F (36.8 C)  Weight: 127 lb (57.607 kg)  SpO2: 95%   Body mass index is 19.89 kg/(m^2). Physical Exam  Constitutional: She is oriented to person, place, and time. No distress.  HENT:  Head: Normocephalic and atraumatic.  Cardiovascular: Normal rate, regular rhythm and normal heart sounds.   Left lower leg with 2+ edema, right nonedematous  Pulmonary/Chest: Effort normal and breath sounds normal.  Musculoskeletal:  Left knee remains significantly warmer than right, but edema in knee improved vs. Last visit and no erythema present, not painful on ambulation like it had been; walks unsteadily with a cane  Neurological: She is alert and oriented to person, place, and time.  Skin:  Chronic venous insufficiency hyperpigmentation of bilateral lower legs; left plantar surface at level of metatarsals with large central callous with small amt of serosanguinous drainage on a bandaid she has applied--no surrounding erythema, warmth or tenderness, no odor  Psychiatric: She has a normal mood and affect.    Labs reviewed: Basic Metabolic Panel:  Recent Labs  03/23/14 0854  08/24/14 1119  10/29/14 1903  12/25/14 1346 01/11/15 1543 02/23/15 1430  NA 141  < > 143  < > 140  < > 139 142 140  K 3.7  < > 4.0  < > 3.7  < > 4.1 3.0* 2.7*  CL 100  < > 101  < > 105  < > 99 99 98*  CO2 26  < > 25  < > 30  <  > 26 26 30   GLUCOSE 74  < > 102*  < > 93  < > 98 101* 114*  BUN 22  < > 26  < > 19  < > 32* 36* 41*  CREATININE 0.88  < > 0.87  < > 1.14*  < > 0.87 0.93 1.03*  CALCIUM 9.4  < > 9.3  < > 8.5*  < > 9.2 9.1 9.1  MG  --   --   --   --  2.3  --   --   --   --   PHOS  --   --   --   --  3.7  --   --   --   --   TSH 2.180  --  0.457  --  0.851  --   --   --   --   < > = values in this interval not displayed. Liver Function Tests:  Recent Labs  10/30/14 0422 12/14/14 1041 12/25/14 1346  AST 21 37 41*  ALT 10* 13 15  ALKPHOS 118 118* 114  BILITOT 0.7 0.4 0.4  PROT 4.9* 6.0 5.9*  ALBUMIN 2.8* 4.0 4.0   No results for input(s): LIPASE, AMYLASE in the last 8760 hours. No results for input(s): AMMONIA in the last 8760 hours. CBC:  Recent Labs  10/30/14 0422 11/26/14 1528  12/25/14 1346 01/11/15 1543 02/23/15 1430  WBC 6.8 7.1  < > 7.0 8.9 8.9  NEUTROABS  --  4.7  --  5.1 7.3*  --   HGB 8.6* 11.2*  --   --   --  12.0  HCT 27.1* 35.0*  < > 40.0 37.4 36.1  MCV 93.4 91.9  --   --   --  86.2  PLT 288 280  --   --   --  397  < > = values in this interval not displayed. Lipid Panel:  Recent Labs  03/23/14 0854  CHOL 239*  HDL 86  LDLCALC 130*  TRIG 115  CHOLHDL 2.8   No results found for: HGBA1C  Procedures since last visit: EKG today:  NSR at 59bpm with pacs, low limb voltage  Assessment/Plan 1. Infection of prosthetic left knee joint ([place]) -highly suspected, but could not be confirmed -is off abx in anticipation of surgery so wound culture can be obtained  -noted now has open callous on her foot (does not appear at all infected, but is present) -labs, cxr, ekg and ua have been unremarkable -await left knee resection of left TKA and placement of abx spacer  2. Rheumatoid arthritis involving knee, unspecified laterality, unspecified rheumatoid factor presence ([place]) -continues on arava -off her injectable immune  drugs until infection treatment complete  3. Paroxysmal  atrial fibrillation ([facility]) -off coumadin now for pending surgery on 12/19, continues on toprol xl for rate control -has been in sinus  4. Current use of steroid medication -noted, if pt declines, assess cortisol for adrenal insufficiency  5. Ulcerative colitis, chronic, without complications ([place]) -cont balsazide for her UC  6. Chronic venous stasis dermatitis of both lower extremities -left worse than right at present -not using real compression hose at present b/c she and her husband can't get them on -elevate feet at rest  7. Preoperative clearance -she is cleared for surgery -she is a fairly high risk patient considering her tobacco abuse, infection, afib, and autoimmune diseases  - EKG 12-Lead was unremarkable as above -pt requested that we inform Dr. Alvan Dame of the open callous on her foot (does not appear at all infected)   Labs/tests ordered:  Orders Placed This Encounter  Procedures  . EKG 12-Lead    Order Specific Question:  Where should this test be performed    Answer:  OTHER    Next appt:  I will see her post-op post-rehab  Latricia Cerrito L. Belicia Difatta, D.O. [facility] [address], [place] [address] Cell Phone (Mon-Fri 8am-5pm):  [phone] On Call:  [phone] & follow prompts after 5pm & weekends Office Phone:  [phone] Office Fax:  [phone]

## 2015-02-28 NOTE — H&P (Signed)
[name] is an 80 y.o. female.    Chief Complaint:   Infected left total knee arthroplasty  Procedure:   Resection left TKA, placement of antibiotic spacer  HPI: Pt is a 80 y.o. female complaining of left knee pain and swelling. Lab work and previous findings indicate an infection in the left knee.   X-rays in the clinic show previous TKA of the left knee. Pt has previous procedure on the left knee including a TKA on 06/16/2014 and an I&D of the left TKA on 10/13/2014. Various options are discussed with the patient. Risks, benefits and expectations were discussed with the patient. Patient understand the risks, benefits and expectations and wishes to proceed with surgery.    PCP: Hollace Kinnier, DO  D/C Plans:      SNF  Post-op Meds:       No Rx given   Tranexamic Acid:      To be given - IV   Decadron:      Is to be given  FYI:     ASA   Oxycodone  Bledsoe brace  Working with Dr. Tommy Medal  Miralax and Colace as needed, pt has UC  No abx prior to cultures   PMH: Past Medical History  Diagnosis Date  . Ulcerative colitis ([place])   . Hypothyroidism   . Hypertension     pcp   dr reed   [facility]  . Pneumonia 2008  . Chronic bronchitis ([place]) 10/31/2011    "prone to it; I have sinus/bronchitis problem probably q yr"  . Multiple skin tears 10/31/2011    "get them very easily; my skin is thin and I bruise easily"  . Chronic back pain     "my entire spine"  . Edema   . Other and unspecified hyperlipidemia   . Encounter for long-term (current) use of other medications   . Senile osteoporosis   . Spinal stenosis, unspecified region other than cervical   . Unspecified glaucoma   . Hypothyroidism   . Anxiety     sometimes   . Rheumatoid arthritis(714.0)     degenerative disc disease   . Swelling of right upper extremity 11/26/2014  . Diarrhea 01/27/2015  . Complication of anesthesia 2009    htn with block- for hand surgery   . Dysrhythmia     pt states taking  coumadin for paroxysmal atrial fibrillation   . Peripheral vascular disease ([place])   . Depression   . Anemia   . History of blood transfusion   . Hard of hearing   . Dizziness   . Falls   . H/O clavicle fracture     left    PSH: Past Surgical History  Procedure Laterality Date  . Back surgery    . Sinus surgery with instatrak    . Thyroidectomy, partial  1997  . Posterior fusion lumbar spine  10/31/2011    L2-3; 3-4  . Tonsillectomy      "when I was a small child"  . Functional endoscopic sinus surgery  1988  . Posterior fusion lumbar spine  11/2009    L3-4;  L4-5  . Joint replacement  2009; 2006    joints 2 fingers,rt hand; joint left thumb  . Spine surgery    . Posterior lumbar fusion N/A 08/05/13    T1, T2  . Eye surgery    . Anterior cervical decomp/discectomy fusion N/A 02/17/2014    Procedure: CERVICAL FOUR TO FIVE ANTERIOR CERVICAL  DECOMPRESSION/DISCECTOMY FUSION 1 LEVEL;  Surgeon: [name], MD;  Location: [facility];  Service: Neurosurgery;  Laterality: N/A;  C4-5 Anterior cervical decompression/diskectomy/fusion  . Cataract extraction Left 11/11/2013  . Cataract extraction Right 12/30/2013  . Total knee arthroplasty Left 06/16/2014    Procedure: TOTAL KNEE ARTHROPLASTY;  Surgeon: Paralee Cancel, MD;  Location: [facility];  Service: Orthopedics;  Laterality: Left;  . I&d knee with poly exchange Left 10/13/2014    Procedure: INCISION AND DRAINAGE LEFT TOTAL KNEE WITH POLY EXCHANGE;  Surgeon: Paralee Cancel, MD;  Location: [facility];  Service: Orthopedics;  Laterality: Left;    Social History:  reports that she has been smoking Cigarettes.  She has a 12.5 pack-year smoking history. She has never used smokeless tobacco. She reports that she drinks about 2.4 oz of alcohol per week. She reports that she does not use illicit drugs.  Allergies:  No Known Allergies  Medications: No current facility-administered medications for this encounter.   Current Outpatient Prescriptions    Medication Sig Dispense Refill  . Amino Acids-Protein Hydrolys (FEEDING SUPPLEMENT, PRO-STAT SUGAR FREE 64,) LIQD Take 30 mLs by mouth 3 (three) times daily with meals.     . balsalazide (COLAZAL) 750 MG capsule Take 2,250 mg by mouth 2 (two) times daily.     . Calcium Carb-Cholecalciferol (CALCIUM + D3) 600-200 MG-UNIT TABS Take 1 tablet by mouth daily with breakfast.     . CELEBREX 200 MG capsule Take 200 mg by mouth 2 (two) times daily.     . Cholecalciferol (VITAMIN D3) 1000 UNITS CAPS Take 1,000 Units by mouth daily.    . diazepam (VALIUM) 5 MG tablet Take 1 tablet (5 mg total) by mouth every 6 (six) hours as needed for muscle spasms. 30 tablet 0  . docusate sodium (COLACE) 100 MG capsule Take 100 mg by mouth daily as needed for mild constipation.    Marland Kitchen estradiol (ESTRACE) 0.5 MG tablet Take 0.5 mg by mouth at bedtime.     . ferrous sulfate 325 (65 FE) MG tablet Take 1 tablet (325 mg total) by mouth daily with breakfast. (Patient taking differently: Take 325 mg by mouth daily with lunch. )  3  . folic acid (FOLVITE) 1 MG tablet Take 2 mg by mouth daily.    . furosemide (LASIX) 40 MG tablet Take 1 tablet (40 mg total) by mouth daily. 30 tablet 3  . guaiFENesin (MUCINEX) 600 MG 12 hr tablet Take 600 mg by mouth daily as needed (sinus relief).     Marland Kitchen latanoprost (XALATAN) 0.005 % ophthalmic solution Place 1 drop into both eyes at bedtime.    Marland Kitchen levothyroxine (SYNTHROID) 88 MCG tablet Take 88 mcg by mouth daily before breakfast. DAW, DO NOT SUBSTITUTE WITH GENERIC    . medroxyPROGESTERone (PROVERA) 2.5 MG tablet Take 2.5 mg by mouth at bedtime.     . metoprolol succinate (TOPROL-XL) 25 MG 24 hr tablet Take 1 tablet (25 mg total) by mouth 2 (two) times daily. Take with or immediately following a meal. 60 tablet 3  . Multiple Vitamin (MULTIVITAMIN WITH MINERALS) TABS Take 1 tablet by mouth daily with lunch.     . oxyCODONE (OXY IR/ROXICODONE) 5 MG immediate release tablet Take 1 tablet (5 mg total)  by mouth 2 (two) times daily as needed for severe pain. 1 by mouth every 4 hours as needed for moderate pain, 2 by mouth as needed for severe pain (Patient taking differently: Take 5-10 mg by mouth every 6 (  six) hours as needed for severe pain. 1 by mouth every 4 hours as needed for moderate pain, 2 by mouth as needed for severe pain) 60 tablet 0  . oxyCODONE-acetaminophen (PERCOCET/ROXICET) 5-325 MG tablet Take 1 tablet by mouth every 6 (six) hours as needed. for pain  0  . polyethylene glycol (MIRALAX / GLYCOLAX) packet Take 17 g by mouth 2 (two) times daily. (Patient taking differently: Take 17 g by mouth 2 (two) times daily as needed (constipation). ) 14 each 0  . polyvinyl alcohol (LIQUIFILM TEARS) 1.4 % ophthalmic solution Place 1 drop into both eyes 2 (two) times daily as needed for dry eyes.    . potassium chloride SA (K-DUR,KLOR-CON) 20 MEQ tablet Take 1 tablet (20 mEq total) by mouth daily. (Patient taking differently: Take 20 mEq by mouth daily. Patient is taking 58 MEQ daily for five days.) 30 tablet 3  . predniSONE (DELTASONE) 5 MG tablet Take 5 mg by mouth daily with breakfast.    . Simethicone 125 MG TABS Take 1 tablet (125 mg total) by mouth every 8 (eight) hours as needed. (Patient taking differently: Take 1 tablet by mouth every 8 (eight) hours as needed (flatulence). ) 90 tablet 3  . warfarin (COUMADIN) 4 MG tablet Take one-half tablet (2mg ) daily 30 tablet 3  . leflunomide (ARAVA) 20 MG tablet Take 20 mg by mouth daily.         Review of Systems  Constitutional: Negative.   HENT: Negative.   Eyes: Negative.   Respiratory: Negative.   Cardiovascular: Negative.   Gastrointestinal: Positive for diarrhea.  Genitourinary: Negative.   Musculoskeletal: Positive for joint pain.  Skin: Negative.   Neurological: Negative.   Endo/Heme/Allergies: Negative.   Psychiatric/Behavioral: The patient is nervous/anxious.        Physical Exam  Constitutional: She is oriented to  person, place, and time. She appears well-developed.  HENT:  Head: Normocephalic.  Eyes: Pupils are equal, round, and reactive to light.  Neck: Neck supple. No JVD present. No tracheal deviation present. No thyromegaly present.  Cardiovascular: Normal rate, regular rhythm, normal heart sounds and intact distal pulses.   Respiratory: Effort normal and breath sounds normal. No stridor. No respiratory distress. She has no wheezes.  GI: Soft. There is no tenderness. There is no guarding.  Musculoskeletal:       Left knee: She exhibits decreased range of motion, swelling and laceration (healed incision). She exhibits no ecchymosis, no deformity and no erythema. Tenderness found.  Lymphadenopathy:    She has no cervical adenopathy.  Neurological: She is alert and oriented to person, place, and time.  Skin: Skin is warm and dry.  Psychiatric: She has a normal mood and affect.     Assessment/Plan Assessment:   Infected left total knee arthroplasty   Plan: Patient will undergo a resection left TKA, placement of antibiotic spacer on 03/01/2015 per Dr. Alvan Dame at [facility]. Risks benefits and expectations were discussed with the patient. Patient understand risks, benefits and expectations and wishes to proceed.     West Pugh Mistina Coatney   PA-C  02/28/2015, 7:25 PM

## 2015-03-01 ENCOUNTER — Encounter (HOSPITAL_COMMUNITY)
Admission: RE | Disposition: A | Discharge status: Skilled Nursing Facility | Payer: Self-pay | Source: Ambulatory Visit | Attending: Orthopedic Surgery

## 2015-03-01 ENCOUNTER — Inpatient Hospital Stay (HOSPITAL_COMMUNITY): Payer: Medicare Other | Admitting: Certified Registered"

## 2015-03-01 ENCOUNTER — Encounter (HOSPITAL_COMMUNITY): Payer: Self-pay | Admitting: *Deleted

## 2015-03-01 ENCOUNTER — Inpatient Hospital Stay (HOSPITAL_COMMUNITY)
Admission: RE | Admit: 2015-03-01 | Discharge: 2015-03-04 | DRG: 465 | Disposition: A | Discharge status: Skilled Nursing Facility | Payer: Medicare Other | Source: Ambulatory Visit | Attending: Orthopedic Surgery | Admitting: Orthopedic Surgery

## 2015-03-01 DIAGNOSIS — I10 Essential (primary) hypertension: Secondary | ICD-10-CM | POA: Diagnosis present

## 2015-03-01 DIAGNOSIS — Z79899 Other long term (current) drug therapy: Secondary | ICD-10-CM | POA: Diagnosis not present

## 2015-03-01 DIAGNOSIS — E039 Hypothyroidism, unspecified: Secondary | ICD-10-CM | POA: Diagnosis present

## 2015-03-01 DIAGNOSIS — I480 Paroxysmal atrial fibrillation: Secondary | ICD-10-CM | POA: Diagnosis present

## 2015-03-01 DIAGNOSIS — Z5181 Encounter for therapeutic drug level monitoring: Secondary | ICD-10-CM

## 2015-03-01 DIAGNOSIS — E785 Hyperlipidemia, unspecified: Secondary | ICD-10-CM | POA: Diagnosis present

## 2015-03-01 DIAGNOSIS — T8454XA Infection and inflammatory reaction due to internal left knee prosthesis, initial encounter: Principal | ICD-10-CM | POA: Diagnosis present

## 2015-03-01 DIAGNOSIS — F17210 Nicotine dependence, cigarettes, uncomplicated: Secondary | ICD-10-CM | POA: Diagnosis present

## 2015-03-01 DIAGNOSIS — Z89529 Acquired absence of unspecified knee: Secondary | ICD-10-CM

## 2015-03-01 DIAGNOSIS — B9689 Other specified bacterial agents as the cause of diseases classified elsewhere: Secondary | ICD-10-CM | POA: Diagnosis not present

## 2015-03-01 DIAGNOSIS — H9190 Unspecified hearing loss, unspecified ear: Secondary | ICD-10-CM | POA: Diagnosis present

## 2015-03-01 DIAGNOSIS — E876 Hypokalemia: Secondary | ICD-10-CM | POA: Diagnosis present

## 2015-03-01 DIAGNOSIS — Y831 Surgical operation with implant of artificial internal device as the cause of abnormal reaction of the patient, or of later complication, without mention of misadventure at the time of the procedure: Secondary | ICD-10-CM | POA: Diagnosis present

## 2015-03-01 DIAGNOSIS — M810 Age-related osteoporosis without current pathological fracture: Secondary | ICD-10-CM | POA: Diagnosis present

## 2015-03-01 DIAGNOSIS — I739 Peripheral vascular disease, unspecified: Secondary | ICD-10-CM | POA: Diagnosis present

## 2015-03-01 DIAGNOSIS — M25562 Pain in left knee: Secondary | ICD-10-CM | POA: Diagnosis present

## 2015-03-01 DIAGNOSIS — Z7901 Long term (current) use of anticoagulants: Secondary | ICD-10-CM | POA: Diagnosis not present

## 2015-03-01 DIAGNOSIS — M62838 Other muscle spasm: Secondary | ICD-10-CM

## 2015-03-01 DIAGNOSIS — H409 Unspecified glaucoma: Secondary | ICD-10-CM | POA: Diagnosis present

## 2015-03-01 DIAGNOSIS — Z9889 Other specified postprocedural states: Secondary | ICD-10-CM | POA: Diagnosis not present

## 2015-03-01 HISTORY — PX: EXCISIONAL TOTAL KNEE ARTHROPLASTY WITH ANTIBIOTIC SPACERS: SHX5827

## 2015-03-01 LAB — SYNOVIAL CELL COUNT + DIFF, W/ CRYSTALS
Crystals, Fluid: NONE SEEN
Eosinophils-Synovial: 0 % (ref 0–1)
LYMPHOCYTES-SYNOVIAL FLD: 14 % (ref 0–20)
MONOCYTE-MACROPHAGE-SYNOVIAL FLUID: 5 % — AB (ref 50–90)
NEUTROPHIL, SYNOVIAL: 81 % — AB (ref 0–25)
WBC, Synovial: UNDETERMINED /mm3 (ref 0–200)

## 2015-03-01 LAB — PROTIME-INR
INR: 1.07 (ref 0.00–1.49)
PROTHROMBIN TIME: 14.1 s (ref 11.6–15.2)

## 2015-03-01 LAB — CBC
HCT: 34.9 % — ABNORMAL LOW (ref 36.0–46.0)
HEMOGLOBIN: 11.1 g/dL — AB (ref 12.0–15.0)
MCH: 28.6 pg (ref 26.0–34.0)
MCHC: 31.8 g/dL (ref 30.0–36.0)
MCV: 89.9 fL (ref 78.0–100.0)
PLATELETS: 398 10*3/uL (ref 150–400)
RBC: 3.88 MIL/uL (ref 3.87–5.11)
RDW: 16.9 % — ABNORMAL HIGH (ref 11.5–15.5)
WBC: 6.8 10*3/uL (ref 4.0–10.5)

## 2015-03-01 LAB — TYPE AND SCREEN
ABO/RH(D): B POS
ANTIBODY SCREEN: NEGATIVE

## 2015-03-01 LAB — CREATININE, SERUM
CREATININE: 0.94 mg/dL (ref 0.44–1.00)
GFR calc Af Amer: >60 mL/min (ref >60)
GFR, EST NON AFRICAN AMERICAN: 57 mL/min — AB (ref >60)

## 2015-03-01 SURGERY — REMOVAL, TOTAL ARTHROPLASTY HARDWARE, KNEE, WITH ANTIBIOTIC SPACER INSERTION
Anesthesia: General | Site: Knee | Laterality: Left

## 2015-03-01 MED ORDER — FOLIC ACID 1 MG PO TABS
2.0000 mg | ORAL_TABLET | Freq: Every day | ORAL | Status: DC
  Administered 2015-03-02 – 2015-03-04 (×3): 2 mg via ORAL
  Filled 2015-03-01 (×4): qty 2

## 2015-03-01 MED ORDER — LATANOPROST 0.005 % OP SOLN
1.0000 [drp] | Freq: Every day | OPHTHALMIC | Status: DC
  Administered 2015-03-01 – 2015-03-03 (×3): 1 [drp] via OPHTHALMIC
  Filled 2015-03-01: qty 2.5

## 2015-03-01 MED ORDER — SODIUM CHLORIDE 0.9 % IV SOLN
INTRAVENOUS | Status: DC
  Administered 2015-03-01: 20:00:00 via INTRAVENOUS
  Filled 2015-03-01 (×7): qty 1000

## 2015-03-01 MED ORDER — FUROSEMIDE 40 MG PO TABS
40.0000 mg | ORAL_TABLET | Freq: Every day | ORAL | Status: DC
  Administered 2015-03-01 – 2015-03-04 (×3): 40 mg via ORAL
  Filled 2015-03-01 (×4): qty 1

## 2015-03-01 MED ORDER — CHLORHEXIDINE GLUCONATE 4 % EX LIQD: 60.0000 mL | Freq: Once | CUTANEOUS | Status: DC

## 2015-03-01 MED ORDER — TOBRAMYCIN SULFATE 1.2 G IJ SOLR
INTRAMUSCULAR | Status: AC
  Filled 2015-03-01: qty 3.6

## 2015-03-01 MED ORDER — VANCOMYCIN HCL IN DEXTROSE 1-5 GM/200ML-% IV SOLN
INTRAVENOUS | Status: AC
  Filled 2015-03-01: qty 200

## 2015-03-01 MED ORDER — METOCLOPRAMIDE HCL 5 MG/ML IJ SOLN: 5.0000 mg | Freq: Three times a day (TID) | INTRAMUSCULAR | Status: DC | PRN

## 2015-03-01 MED ORDER — VANCOMYCIN HCL 1000 MG IV SOLR
1000.0000 mg | Freq: Once | INTRAVENOUS | Status: AC
  Administered 2015-03-01: 1000 mg via INTRAVENOUS

## 2015-03-01 MED ORDER — CEFAZOLIN SODIUM-DEXTROSE 2-3 GM-% IV SOLR
INTRAVENOUS | Status: AC
  Filled 2015-03-01: qty 50

## 2015-03-01 MED ORDER — FERROUS SULFATE 325 (65 FE) MG PO TABS
325.0000 mg | ORAL_TABLET | Freq: Three times a day (TID) | ORAL | Status: DC
  Administered 2015-03-02 – 2015-03-04 (×5): 325 mg via ORAL
  Filled 2015-03-01 (×11): qty 1

## 2015-03-01 MED ORDER — LACTATED RINGERS IV SOLN
INTRAVENOUS | Status: DC | PRN
  Administered 2015-03-01 (×2): via INTRAVENOUS

## 2015-03-01 MED ORDER — MIDAZOLAM HCL 2 MG/2ML IJ SOLN
INTRAMUSCULAR | Status: AC
  Filled 2015-03-01: qty 2

## 2015-03-01 MED ORDER — PHENOL 1.4 % MT LIQD: 1.0000 | OROMUCOSAL | Status: DC | PRN

## 2015-03-01 MED ORDER — POLYETHYLENE GLYCOL 3350 17 G PO PACK: 17.0000 g | PACK | Freq: Every day | ORAL | Status: DC | PRN

## 2015-03-01 MED ORDER — POLYVINYL ALCOHOL 1.4 % OP SOLN
1.0000 [drp] | Freq: Two times a day (BID) | OPHTHALMIC | Status: DC | PRN
  Filled 2015-03-01: qty 15

## 2015-03-01 MED ORDER — TOBRAMYCIN SULFATE 1.2 G IJ SOLR
INTRAMUSCULAR | Status: DC | PRN
  Administered 2015-03-01: 2.4 g

## 2015-03-01 MED ORDER — CEFAZOLIN SODIUM-DEXTROSE 2-3 GM-% IV SOLR
2.0000 g | INTRAVENOUS | Status: AC
  Administered 2015-03-01: 2 g via INTRAVENOUS

## 2015-03-01 MED ORDER — PROPOFOL 10 MG/ML IV BOLUS
INTRAVENOUS | Status: AC
  Filled 2015-03-01: qty 20

## 2015-03-01 MED ORDER — HYDROMORPHONE HCL 1 MG/ML IJ SOLN: 0.5000 mg | INTRAMUSCULAR | Status: DC | PRN

## 2015-03-01 MED ORDER — BUPIVACAINE-EPINEPHRINE (PF) 0.25% -1:200000 IJ SOLN
INTRAMUSCULAR | Status: AC
  Filled 2015-03-01: qty 30

## 2015-03-01 MED ORDER — SIMETHICONE 80 MG PO CHEW
160.0000 mg | CHEWABLE_TABLET | Freq: Three times a day (TID) | ORAL | Status: DC | PRN
  Filled 2015-03-01: qty 2

## 2015-03-01 MED ORDER — CEFAZOLIN SODIUM-DEXTROSE 2-3 GM-% IV SOLR: 2.0000 g | Freq: Four times a day (QID) | INTRAVENOUS | Status: DC

## 2015-03-01 MED ORDER — ENOXAPARIN SODIUM 40 MG/0.4ML ~~LOC~~ SOLN
40.0000 mg | SUBCUTANEOUS | Status: DC
  Administered 2015-03-02 – 2015-03-04 (×3): 40 mg via SUBCUTANEOUS
  Filled 2015-03-01 (×6): qty 0.4

## 2015-03-01 MED ORDER — MENTHOL 3 MG MT LOZG: 1.0000 | LOZENGE | OROMUCOSAL | Status: DC | PRN

## 2015-03-01 MED ORDER — SUCCINYLCHOLINE CHLORIDE 20 MG/ML IJ SOLN
INTRAMUSCULAR | Status: DC | PRN
  Administered 2015-03-01: 100 mg via INTRAVENOUS

## 2015-03-01 MED ORDER — OXYCODONE HCL 5 MG PO TABS
5.0000 mg | ORAL_TABLET | ORAL | Status: DC
  Administered 2015-03-01 – 2015-03-02 (×7): 10 mg via ORAL
  Administered 2015-03-03 (×4): 5 mg via ORAL
  Administered 2015-03-03 – 2015-03-04 (×5): 10 mg via ORAL
  Filled 2015-03-01 (×2): qty 3
  Filled 2015-03-01 (×2): qty 1
  Filled 2015-03-01: qty 3
  Filled 2015-03-01: qty 2
  Filled 2015-03-01: qty 3
  Filled 2015-03-01 (×2): qty 2
  Filled 2015-03-01: qty 3
  Filled 2015-03-01: qty 1
  Filled 2015-03-01 (×2): qty 3
  Filled 2015-03-01: qty 2
  Filled 2015-03-01: qty 3
  Filled 2015-03-01 (×2): qty 2

## 2015-03-01 MED ORDER — VANCOMYCIN HCL IN DEXTROSE 750-5 MG/150ML-% IV SOLN
750.0000 mg | INTRAVENOUS | Status: DC
  Administered 2015-03-02 – 2015-03-03 (×2): 750 mg via INTRAVENOUS
  Filled 2015-03-01 (×2): qty 150

## 2015-03-01 MED ORDER — MIDAZOLAM HCL 5 MG/5ML IJ SOLN
INTRAMUSCULAR | Status: DC | PRN
  Administered 2015-03-01: 2 mg via INTRAVENOUS

## 2015-03-01 MED ORDER — ROCURONIUM BROMIDE 100 MG/10ML IV SOLN
INTRAVENOUS | Status: AC
  Filled 2015-03-01: qty 1

## 2015-03-01 MED ORDER — SODIUM CHLORIDE 0.9 % IJ SOLN
INTRAMUSCULAR | Status: AC
  Filled 2015-03-01: qty 50

## 2015-03-01 MED ORDER — ALUM & MAG HYDROXIDE-SIMETH 200-200-20 MG/5ML PO SUSP: 30.0000 mL | ORAL | Status: DC | PRN

## 2015-03-01 MED ORDER — METHOCARBAMOL 1000 MG/10ML IJ SOLN
500.0000 mg | Freq: Four times a day (QID) | INTRAVENOUS | Status: DC | PRN
  Administered 2015-03-01: 500 mg via INTRAVENOUS
  Filled 2015-03-01 (×2): qty 5

## 2015-03-01 MED ORDER — DIPHENHYDRAMINE HCL 25 MG PO CAPS: 25.0000 mg | ORAL_CAPSULE | Freq: Four times a day (QID) | ORAL | Status: DC | PRN

## 2015-03-01 MED ORDER — METOPROLOL SUCCINATE ER 25 MG PO TB24
25.0000 mg | ORAL_TABLET | Freq: Two times a day (BID) | ORAL | Status: DC
  Administered 2015-03-01 – 2015-03-04 (×4): 25 mg via ORAL
  Filled 2015-03-01 (×7): qty 1

## 2015-03-01 MED ORDER — LEVOTHYROXINE SODIUM 88 MCG PO TABS
88.0000 ug | ORAL_TABLET | Freq: Every day | ORAL | Status: DC
  Administered 2015-03-02 – 2015-03-04 (×3): 88 ug via ORAL
  Filled 2015-03-01 (×6): qty 1

## 2015-03-01 MED ORDER — MEDROXYPROGESTERONE ACETATE 2.5 MG PO TABS
2.5000 mg | ORAL_TABLET | Freq: Every day | ORAL | Status: DC
  Administered 2015-03-01 – 2015-03-03 (×3): 2.5 mg via ORAL
  Filled 2015-03-01 (×4): qty 1

## 2015-03-01 MED ORDER — DIAZEPAM 5 MG PO TABS
5.0000 mg | ORAL_TABLET | Freq: Four times a day (QID) | ORAL | Status: DC | PRN
  Administered 2015-03-03 – 2015-03-04 (×2): 5 mg via ORAL
  Filled 2015-03-01 (×3): qty 1

## 2015-03-01 MED ORDER — FENTANYL CITRATE (PF) 250 MCG/5ML IJ SOLN
INTRAMUSCULAR | Status: AC
  Filled 2015-03-01: qty 5

## 2015-03-01 MED ORDER — WARFARIN SODIUM 3 MG PO TABS
3.0000 mg | ORAL_TABLET | Freq: Once | ORAL | Status: AC
  Administered 2015-03-01: 3 mg via ORAL
  Filled 2015-03-01: qty 1

## 2015-03-01 MED ORDER — METOCLOPRAMIDE HCL 10 MG PO TABS: 5.0000 mg | ORAL_TABLET | Freq: Three times a day (TID) | ORAL | Status: DC | PRN

## 2015-03-01 MED ORDER — MAGNESIUM CITRATE PO SOLN: 1.0000 | Freq: Once | ORAL | Status: DC | PRN

## 2015-03-01 MED ORDER — BALSALAZIDE DISODIUM 750 MG PO CAPS
2250.0000 mg | ORAL_CAPSULE | Freq: Two times a day (BID) | ORAL | Status: DC
  Administered 2015-03-01 – 2015-03-04 (×6): 2250 mg via ORAL
  Filled 2015-03-01 (×7): qty 3

## 2015-03-01 MED ORDER — ROCURONIUM BROMIDE 100 MG/10ML IV SOLN
INTRAVENOUS | Status: DC | PRN
  Administered 2015-03-01: 5 mg via INTRAVENOUS
  Administered 2015-03-01: 35 mg via INTRAVENOUS

## 2015-03-01 MED ORDER — METHOCARBAMOL 500 MG PO TABS
500.0000 mg | ORAL_TABLET | Freq: Four times a day (QID) | ORAL | Status: DC | PRN
  Filled 2015-03-01 (×2): qty 1

## 2015-03-01 MED ORDER — SUGAMMADEX SODIUM 200 MG/2ML IV SOLN
INTRAVENOUS | Status: AC
  Filled 2015-03-01: qty 2

## 2015-03-01 MED ORDER — ONDANSETRON HCL 4 MG/2ML IJ SOLN: 4.0000 mg | Freq: Four times a day (QID) | INTRAMUSCULAR | Status: DC | PRN

## 2015-03-01 MED ORDER — ESTRADIOL 1 MG PO TABS
0.5000 mg | ORAL_TABLET | Freq: Every day | ORAL | Status: DC
  Administered 2015-03-01 – 2015-03-03 (×3): 0.5 mg via ORAL
  Filled 2015-03-01 (×4): qty 0.5

## 2015-03-01 MED ORDER — HYDROMORPHONE HCL 2 MG/ML IJ SOLN
INTRAMUSCULAR | Status: AC
  Filled 2015-03-01: qty 1

## 2015-03-01 MED ORDER — SUGAMMADEX SODIUM 200 MG/2ML IV SOLN
INTRAVENOUS | Status: DC | PRN
  Administered 2015-03-01: 125 mg via INTRAVENOUS

## 2015-03-01 MED ORDER — LIDOCAINE HCL (CARDIAC) 20 MG/ML IV SOLN
INTRAVENOUS | Status: DC | PRN
  Administered 2015-03-01: 50 mg via INTRAVENOUS

## 2015-03-01 MED ORDER — BISACODYL 10 MG RE SUPP: 10.0000 mg | Freq: Every day | RECTAL | Status: DC | PRN

## 2015-03-01 MED ORDER — VANCOMYCIN HCL 1000 MG IV SOLR
INTRAVENOUS | Status: DC | PRN
  Administered 2015-03-01: 2000 mg

## 2015-03-01 MED ORDER — ONDANSETRON HCL 4 MG/2ML IJ SOLN: 4.0000 mg | Freq: Once | INTRAMUSCULAR | Status: DC | PRN

## 2015-03-01 MED ORDER — DOCUSATE SODIUM 100 MG PO CAPS: 100.0000 mg | ORAL_CAPSULE | Freq: Two times a day (BID) | ORAL | Status: DC | PRN

## 2015-03-01 MED ORDER — HYDROMORPHONE HCL 1 MG/ML IJ SOLN
INTRAMUSCULAR | Status: DC | PRN
  Administered 2015-03-01 (×2): 1 mg via INTRAVENOUS

## 2015-03-01 MED ORDER — DEXAMETHASONE SODIUM PHOSPHATE 10 MG/ML IJ SOLN
10.0000 mg | Freq: Once | INTRAMUSCULAR | Status: DC
  Filled 2015-03-01: qty 1

## 2015-03-01 MED ORDER — ACETAMINOPHEN 650 MG RE SUPP: 650.0000 mg | Freq: Four times a day (QID) | RECTAL | Status: DC | PRN

## 2015-03-01 MED ORDER — WARFARIN - PHARMACIST DOSING INPATIENT: Freq: Every day | Status: DC

## 2015-03-01 MED ORDER — ONDANSETRON HCL 4 MG/2ML IJ SOLN
INTRAMUSCULAR | Status: AC
  Filled 2015-03-01: qty 2

## 2015-03-01 MED ORDER — LACTATED RINGERS IV SOLN: INTRAVENOUS | Status: DC

## 2015-03-01 MED ORDER — SODIUM CHLORIDE 0.9 % IR SOLN
Status: DC | PRN
  Administered 2015-03-01: 1000 mL
  Administered 2015-03-01: 3000 mL

## 2015-03-01 MED ORDER — ONDANSETRON HCL 4 MG PO TABS: 4.0000 mg | ORAL_TABLET | Freq: Four times a day (QID) | ORAL | Status: DC | PRN

## 2015-03-01 MED ORDER — PREDNISONE 5 MG PO TABS
5.0000 mg | ORAL_TABLET | Freq: Every day | ORAL | Status: DC
  Administered 2015-03-02 – 2015-03-04 (×3): 5 mg via ORAL
  Filled 2015-03-01 (×6): qty 1

## 2015-03-01 MED ORDER — PROPOFOL 10 MG/ML IV BOLUS
INTRAVENOUS | Status: DC | PRN
Start: 2015-03-01 — End: 2015-03-01
  Administered 2015-03-01: 150 mg via INTRAVENOUS

## 2015-03-01 MED ORDER — VANCOMYCIN HCL 1000 MG IV SOLR
INTRAVENOUS | Status: AC
  Filled 2015-03-01: qty 3000

## 2015-03-01 MED ORDER — CELECOXIB 200 MG PO CAPS
200.0000 mg | ORAL_CAPSULE | Freq: Two times a day (BID) | ORAL | Status: DC
Start: 2015-03-01 — End: 2015-03-04
  Administered 2015-03-01 – 2015-03-04 (×6): 200 mg via ORAL
  Filled 2015-03-01 (×7): qty 1

## 2015-03-01 MED ORDER — VANCOMYCIN HCL 1000 MG IV SOLR
INTRAVENOUS | Status: AC
  Filled 2015-03-01: qty 1000

## 2015-03-01 MED ORDER — LIDOCAINE HCL (CARDIAC) 20 MG/ML IV SOLN
INTRAVENOUS | Status: AC
  Filled 2015-03-01: qty 5

## 2015-03-01 MED ORDER — ACETAMINOPHEN 325 MG PO TABS: 650.0000 mg | ORAL_TABLET | Freq: Four times a day (QID) | ORAL | Status: DC | PRN

## 2015-03-01 MED ORDER — KETOROLAC TROMETHAMINE 30 MG/ML IJ SOLN
INTRAMUSCULAR | Status: AC
  Filled 2015-03-01: qty 1

## 2015-03-01 MED ORDER — TOBRAMYCIN SULFATE 1.2 G IJ SOLR
INTRAMUSCULAR | Status: AC
  Filled 2015-03-01: qty 2.4

## 2015-03-01 MED ORDER — ONDANSETRON HCL 4 MG/2ML IJ SOLN
INTRAMUSCULAR | Status: DC | PRN
  Administered 2015-03-01: 4 mg via INTRAVENOUS

## 2015-03-01 MED ORDER — HYDROMORPHONE HCL 1 MG/ML IJ SOLN
0.5000 mg | INTRAMUSCULAR | Status: DC | PRN
  Administered 2015-03-01 (×2): 0.5 mg via INTRAVENOUS

## 2015-03-01 MED ORDER — FENTANYL CITRATE (PF) 100 MCG/2ML IJ SOLN
INTRAMUSCULAR | Status: DC | PRN
  Administered 2015-03-01 (×2): 50 ug via INTRAVENOUS
  Administered 2015-03-01: 100 ug via INTRAVENOUS
  Administered 2015-03-01: 50 ug via INTRAVENOUS

## 2015-03-01 MED ORDER — HYDROMORPHONE HCL 1 MG/ML IJ SOLN
INTRAMUSCULAR | Status: AC
  Administered 2015-03-01: 0.5 mg via INTRAVENOUS
  Filled 2015-03-01: qty 1

## 2015-03-01 SURGICAL SUPPLY — 65 items
BAG SPEC THK2 15X12 ZIP CLS (MISCELLANEOUS) ×1
BAG ZIPLOCK 12X15 (MISCELLANEOUS) ×3 IMPLANT
BANDAGE ELASTIC 6 VELCRO ST LF (GAUZE/BANDAGES/DRESSINGS) ×3 IMPLANT
BANDAGE ESMARK 6X9 LF (GAUZE/BANDAGES/DRESSINGS) ×1 IMPLANT
BLADE SAW SGTL 13.0X1.19X90.0M (BLADE) ×3 IMPLANT
BLADE SAW SGTL 81X20 HD (BLADE) ×3 IMPLANT
BNDG CMPR 9X6 STRL LF SNTH (GAUZE/BANDAGES/DRESSINGS) ×1
BNDG ESMARK 6X9 LF (GAUZE/BANDAGES/DRESSINGS) ×3
BOWL SMART MIX CTS (DISPOSABLE) IMPLANT
CEMENT HV SMART SET (Cement) ×9 IMPLANT
CUFF TOURN SGL QUICK 34 (TOURNIQUET CUFF) ×3
CUFF TRNQT CYL 34X4X40X1 (TOURNIQUET CUFF) ×1 IMPLANT
DRAPE EXTREMITY T 121X128X90 (DRAPE) ×3 IMPLANT
DRAPE POUCH INSTRU U-SHP 10X18 (DRAPES) ×3 IMPLANT
DRAPE U-SHAPE 47X51 STRL (DRAPES) ×3 IMPLANT
DRSG AQUACEL AG ADV 3.5X10 (GAUZE/BANDAGES/DRESSINGS) ×3 IMPLANT
DURAPREP 26ML APPLICATOR (WOUND CARE) ×6 IMPLANT
ELECT REM PT RETURN 9FT ADLT (ELECTROSURGICAL) ×3
ELECTRODE REM PT RTRN 9FT ADLT (ELECTROSURGICAL) ×1 IMPLANT
FACESHIELD WRAPAROUND (MASK) ×15 IMPLANT
FACESHIELD WRAPAROUND OR TEAM (MASK) ×5 IMPLANT
GAUZE SPONGE 4X4 12PLY STRL (GAUZE/BANDAGES/DRESSINGS) ×2 IMPLANT
GAUZE XEROFORM 1X8 LF (GAUZE/BANDAGES/DRESSINGS) ×2 IMPLANT
GLOVE BIOGEL M 7.0 STRL (GLOVE) IMPLANT
GLOVE BIOGEL PI IND STRL 7.5 (GLOVE) ×1 IMPLANT
GLOVE BIOGEL PI IND STRL 8.5 (GLOVE) ×1 IMPLANT
GLOVE BIOGEL PI INDICATOR 7.5 (GLOVE) ×2
GLOVE BIOGEL PI INDICATOR 8.5 (GLOVE) ×2
GLOVE ECLIPSE 8.0 STRL XLNG CF (GLOVE) ×6 IMPLANT
GLOVE ORTHO TXT STRL SZ7.5 (GLOVE) ×6 IMPLANT
GOWN STRL REUS W/TWL LRG LVL3 (GOWN DISPOSABLE) ×3 IMPLANT
GOWN STRL REUS W/TWL XL LVL3 (GOWN DISPOSABLE) ×3 IMPLANT
HANDPIECE INTERPULSE COAX TIP (DISPOSABLE) ×3
IMMOBILIZER KNEE 20 (SOFTGOODS) ×2 IMPLANT
INSERT TIBIAL PFC SIG SZ3 10MM (Knees) ×2 IMPLANT
LIQUID BAND (GAUZE/BANDAGES/DRESSINGS) ×3 IMPLANT
MANIFOLD NEPTUNE II (INSTRUMENTS) ×3 IMPLANT
NDL SAFETY ECLIPSE 18X1.5 (NEEDLE) ×1 IMPLANT
NEEDLE HYPO 18GX1.5 SHARP (NEEDLE) ×3
NS IRRIG 1000ML POUR BTL (IV SOLUTION) ×3 IMPLANT
PACK TOTAL JOINT (CUSTOM PROCEDURE TRAY) ×3 IMPLANT
PAD ABD 8X10 STRL (GAUZE/BANDAGES/DRESSINGS) ×2 IMPLANT
PADDING CAST COTTON 6X4 STRL (CAST SUPPLIES) ×2 IMPLANT
PEN SKIN MARKING BROAD (MISCELLANEOUS) ×3 IMPLANT
POSITIONER SURGICAL ARM (MISCELLANEOUS) ×3 IMPLANT
SET HNDPC FAN SPRY TIP SCT (DISPOSABLE) ×1 IMPLANT
SET PAD KNEE POSITIONER (MISCELLANEOUS) ×3 IMPLANT
SPACER KASM MOLD44APX67ML KNEE (Spacer) ×2 IMPLANT
SPONGE LAP 18X18 X RAY DECT (DISPOSABLE) ×3 IMPLANT
STAPLER VISISTAT 35W (STAPLE) IMPLANT
SUCTION FRAZIER 12FR DISP (SUCTIONS) ×3 IMPLANT
SUT MNCRL AB 4-0 PS2 18 (SUTURE) ×3 IMPLANT
SUT PDS AB 1 CT1 27 (SUTURE) ×6 IMPLANT
SUT VIC AB 1 CT1 36 (SUTURE) ×6 IMPLANT
SUT VIC AB 2-0 CT1 27 (SUTURE) ×9
SUT VIC AB 2-0 CT1 TAPERPNT 27 (SUTURE) ×3 IMPLANT
SUT VLOC 180 0 24IN GS25 (SUTURE) IMPLANT
SYR 50ML LL SCALE MARK (SYRINGE) ×3 IMPLANT
TOWEL OR 17X26 10 PK STRL BLUE (TOWEL DISPOSABLE) ×6 IMPLANT
TOWEL OR NON WOVEN STRL DISP B (DISPOSABLE) ×3 IMPLANT
TOWER CARTRIDGE SMART MIX (DISPOSABLE) ×3 IMPLANT
TRAY FOLEY W/METER SILVER 14FR (SET/KITS/TRAYS/PACK) ×3 IMPLANT
WATER STERILE IRR 1500ML POUR (IV SOLUTION) ×3 IMPLANT
WRAP KNEE MAXI GEL POST OP (GAUZE/BANDAGES/DRESSINGS) ×3 IMPLANT
YANKAUER SUCT BULB TIP 10FT TU (MISCELLANEOUS) ×3 IMPLANT

## 2015-03-01 NOTE — Progress Notes (Signed)
ANTICOAGULATION CONSULT NOTE - Initial Consult  Pharmacy Consult for Warfarin for atrial fibrillation; Vancomycin for infected left TKA  No Known Allergies  Patient Measurements: Height: 5\' 7"  (170.2 cm) Weight: 127 lb (57.607 kg) IBW/kg (Calculated) : 61.6  Vital Signs: Temp: 98.8 F (37.1 C) (12/19 1725) Temp Source: Oral (12/19 1155) BP: 141/73 mmHg (12/19 1815) Pulse Rate: 51 (12/19 1815)  Labs:  Recent Labs  03/01/15 1314  LABPROT 14.1  INR 1.07    Estimated Creatinine Clearance: 40.9 mL/min (by C-G formula based on Cr of 1.03).   Medical History: Past Medical History  Diagnosis Date  . Ulcerative colitis ([place])   . Hypothyroidism   . Hypertension     pcp   dr reed   [facility]  . Pneumonia 2008  . Chronic bronchitis ([place]) 10/31/2011    "prone to it; I have sinus/bronchitis problem probably q yr"  . Multiple skin tears 10/31/2011    "get them very easily; my skin is thin and I bruise easily"  . Chronic back pain     "my entire spine"  . Edema   . Other and unspecified hyperlipidemia   . Encounter for long-term (current) use of other medications   . Senile osteoporosis   . Spinal stenosis, unspecified region other than cervical   . Unspecified glaucoma   . Hypothyroidism   . Anxiety     sometimes   . Rheumatoid arthritis(714.0)     degenerative disc disease   . Swelling of right upper extremity 11/26/2014  . Diarrhea 01/27/2015  . Complication of anesthesia 2009    htn with block- for hand surgery   . Dysrhythmia     pt states taking coumadin for paroxysmal atrial fibrillation   . Peripheral vascular disease ([place])   . Depression   . Anemia   . History of blood transfusion   . Hard of hearing   . Dizziness   . Falls   . H/O clavicle fracture     left    Medications:  Scheduled:  . balsalazide  2,250 mg Oral BID  .  ceFAZolin (ANCEF) IV  2 g Intravenous Q6H  . celecoxib  200 mg Oral Q12H  . [START ON 03/02/2015] dexamethasone  10 mg  Intravenous Once  . [START ON 03/02/2015] enoxaparin (LOVENOX) injection  40 mg Subcutaneous Q24H  . estradiol  0.5 mg Oral QHS  . ferrous sulfate  325 mg Oral TID PC  . folic acid  2 mg Oral Daily  . furosemide  40 mg Oral Daily  . latanoprost  1 drop Both Eyes QHS  . [START ON 03/02/2015] levothyroxine  88 mcg Oral QAC breakfast  . medroxyPROGESTERone  2.5 mg Oral QHS  . metoprolol succinate  25 mg Oral BID  . oxyCODONE  5-15 mg Oral Q4H  . [START ON 03/02/2015] predniSONE  5 mg Oral Q breakfast   Infusions:  . sodium chloride 0.9 % 1,000 mL with potassium chloride 10 mEq infusion     PRN: acetaminophen **OR** acetaminophen, alum & mag hydroxide-simeth, bisacodyl, diazepam, diphenhydrAMINE, docusate sodium, HYDROmorphone (DILAUDID) injection, magnesium citrate, menthol-cetylpyridinium **OR** phenol, methocarbamol **OR** methocarbamol (ROBAXIN)  IV, metoCLOPramide **OR** metoCLOPramide (REGLAN) injection, ondansetron **OR** ondansetron (ZOFRAN) IV, polyethylene glycol, polyvinyl alcohol, Simethicone  Assessment: 80 yo female s/p resection of left TKA with placement of antibiotic spacers. On chronic warfarin for history of afib.  Home dose 2mg  daily, last dose was to be taken 12/12 in preparation for surgery.  Pharmacy is  consulted to resume warfarin dosing post-op and dose vancomycin for infection knee.   Goal of Therapy:  INR 2-3 Monitor platelets by anticoagulation protocol: Yes   Warfarin Plan:   Warfarin 3mg  PO x 1 tonight at 22:00  Daily PT/INR  Per MD, Lovenox 40mg  SQ q24h until INR >/= 1.8   Vancomycin Plan:  750mg  IV q24h starting 12/20 (Received 1g IV pre-op at 16:00 today) Check trough at steady state Follow up renal function & cultures, clinical course  Peggyann Juba, PharmD, BCPS Pager: [phone] 03/01/2015,6:54 PM

## 2015-03-01 NOTE — Anesthesia Postprocedure Evaluation (Signed)
Anesthesia Post Note  [name]  Procedure(s) Performed: Procedure(s) (LRB): RESECTION LEFT TOTAL KNEE ARTHROPLASTY WITH PLACEMENT OF  ANTIBIOTIC SPACERS (Left)  Patient location during evaluation: PACU Anesthesia Type: General Level of consciousness: awake, awake and alert, oriented and patient cooperative Pain management: pain level controlled Vital Signs Assessment: post-procedure vital signs reviewed and stable Respiratory status: spontaneous breathing and respiratory function stable Cardiovascular status: stable Postop Assessment: no signs of nausea or vomiting Anesthetic complications: no    Last Vitals:  Filed Vitals:   03/01/15 1745 03/01/15 1800  BP: 135/62 150/74  Pulse: 66 61  Temp:    Resp: 19 13    Last Pain:  Filed Vitals:   03/01/15 1804  PainSc: 5                  Sheryle Vice EDWARD

## 2015-03-01 NOTE — Anesthesia Preprocedure Evaluation (Signed)
Anesthesia Evaluation  Patient identified by MRN, date of birth, ID band Patient awake    Reviewed: Allergy & Precautions, NPO status , Patient's Chart, lab work & pertinent test results  Airway Mallampati: I       Dental   Pulmonary Current Smoker,    Pulmonary exam normal        Cardiovascular hypertension, + Peripheral Vascular Disease and +CHF  + dysrhythmias  Rhythm:Irregular Rate:Abnormal     Neuro/Psych    GI/Hepatic PUD,   Endo/Other  Hypothyroidism   Renal/GU      Musculoskeletal  (+) Arthritis , Rheumatoid disorders,    Abdominal   Peds  Hematology   Anesthesia Other Findings   Reproductive/Obstetrics                             Anesthesia Physical Anesthesia Plan  ASA: III  Anesthesia Plan: General   Post-op Pain Management:    Induction: Intravenous  Airway Management Planned: Oral ETT  Additional Equipment:   Intra-op Plan:   Post-operative Plan: Extubation in OR  Informed Consent: I have reviewed the patients History and Physical, chart, labs and discussed the procedure including the risks, benefits and alternatives for the proposed anesthesia with the patient or authorized representative who has indicated his/her understanding and acceptance.     Plan Discussed with: CRNA, Anesthesiologist and Surgeon  Anesthesia Plan Comments:         Anesthesia Quick Evaluation

## 2015-03-01 NOTE — Transfer of Care (Signed)
Immediate Anesthesia Transfer of Care Note  [name]  Procedure(s) Performed: Procedure(s): RESECTION LEFT TOTAL KNEE ARTHROPLASTY WITH PLACEMENT OF  ANTIBIOTIC SPACERS (Left)  Patient Location: PACU  Anesthesia Type:General  Level of Consciousness: awake, alert  and oriented  Airway & Oxygen Therapy: Patient Spontanous Breathing and Patient connected to face mask oxygen  Post-op Assessment: Report given to RN and Post -op Vital signs reviewed and stable  Post vital signs: Reviewed and stable  Last Vitals:  Filed Vitals:   03/01/15 1155  BP: 141/72  Pulse: 63  Temp: 36.8 C  Resp: 16    Complications: No apparent anesthesia complications

## 2015-03-01 NOTE — Brief Op Note (Signed)
03/01/2015  5:14 PM  PATIENT:  [name]  80 y.o. female  PRE-OPERATIVE DIAGNOSIS:  INFECTED LEFT TOTAL KNEE   POST-OPERATIVE DIAGNOSIS:  INFECTED LEFT TOTAL KNEE  PROCEDURE:  Procedure(s): RESECTION LEFT TOTAL KNEE ARTHROPLASTY WITH PLACEMENT OF  ANTIBIOTIC SPACERS (Left)  SURGEON:  Surgeon(s) and Role:    * Paralee Cancel, MD - Primary  PHYSICIAN ASSISTANT: Danae Orleans, PA-C  ANESTHESIA:   general  EBL:  Total I/O In: 1000 [I.V.:1000] Out: 200 [Urine:200]  BLOOD ADMINISTERED:none  DRAINS: none   LOCAL MEDICATIONS USED:  NONE  SPECIMEN:  Source of Specimen:  left knee, synovial fluid and swabs  DISPOSITION OF SPECIMEN:  PATHOLOGY  COUNTS:  YES  TOURNIQUET:   Total Tourniquet Time Documented: Thigh (Left) - 70 minutes Total: Thigh (Left) - 70 minutes   DICTATION: .Other Dictation: Dictation Number [id number]  PLAN OF CARE: Admit to inpatient   PATIENT DISPOSITION:  PACU - hemodynamically stable.   Delay start of Pharmacological VTE agent (>24hrs) due to surgical blood loss or risk of bleeding: no

## 2015-03-01 NOTE — Interval H&P Note (Signed)
History and Physical Interval Note:  03/01/2015 2:42 PM  [name]  has presented today for surgery, with the diagnosis of INFECTED LEFT TOTAL KNEE   The various methods of treatment have been discussed with the patient and family. After consideration of risks, benefits and other options for treatment, the patient has consented to  Procedure(s): RESECTION LEFT TOTAL KNEE ARTHROPLASTY WITH PLACEMENT OF  ANTIBIOTIC SPACERS (Left) as a surgical intervention .  The patient's history has been reviewed, patient examined, no change in status, stable for surgery.  I have reviewed the patient's chart and labs.  Questions were answered to the patient's satisfaction.     Mauri Pole

## 2015-03-01 NOTE — Anesthesia Procedure Notes (Signed)
Procedure Name: Intubation Date/Time: 03/01/2015 3:27 PM Performed by: Noralyn Pick D Pre-anesthesia Checklist: Patient identified, Emergency Drugs available, Suction available and Patient being monitored Patient Re-evaluated:Patient Re-evaluated prior to inductionOxygen Delivery Method: Circle System Utilized Preoxygenation: Pre-oxygenation with 100% oxygen Intubation Type: IV induction Ventilation: Mask ventilation without difficulty Laryngoscope Size: Mac and 3 Grade View: Grade I Tube type: Oral Tube size: 7.0 mm Number of attempts: 1 Airway Equipment and Method: Stylet and Oral airway Placement Confirmation: ETT inserted through vocal cords under direct vision,  positive ETCO2 and breath sounds checked- equal and bilateral Secured at: 21 cm Tube secured with: Tape Dental Injury: Teeth and Oropharynx as per pre-operative assessment

## 2015-03-01 NOTE — Progress Notes (Deleted)
Patient's Rt foot and ankle areas are  swollen and inflammed. There is a small open area on bottom of foot which is draining small amt brown liquid. It is covered with telfa.  Dr. Alvan Dame was notified and he will examine her in the holding room

## 2015-03-01 NOTE — Progress Notes (Signed)
Patient has been taking potassium 37meq for the past 5 days per Dr. Aurea Graff order. She last took it 02/28/15

## 2015-03-01 NOTE — Progress Notes (Signed)
Left ankle and foot swollen and inflammed. There is a small opened area on bottom of Left foot draining small amt brown liquid. It is covered with telfa. Dr. Alvan Dame notified and will examine her in holding room

## 2015-03-02 ENCOUNTER — Encounter (HOSPITAL_COMMUNITY): Payer: Self-pay | Admitting: Orthopedic Surgery

## 2015-03-02 LAB — CBC
HEMATOCRIT: 27.8 % — AB (ref 36.0–46.0)
Hemoglobin: 9.1 g/dL — ABNORMAL LOW (ref 12.0–15.0)
MCH: 29.1 pg (ref 26.0–34.0)
MCHC: 32.7 g/dL (ref 30.0–36.0)
MCV: 88.8 fL (ref 78.0–100.0)
Platelets: 281 10*3/uL (ref 150–400)
RBC: 3.13 MIL/uL — ABNORMAL LOW (ref 3.87–5.11)
RDW: 16.4 % — AB (ref 11.5–15.5)
WBC: 12 10*3/uL — AB (ref 4.0–10.5)

## 2015-03-02 LAB — PROTIME-INR
INR: 1.12 (ref 0.00–1.49)
Prothrombin Time: 14.6 seconds (ref 11.6–15.2)

## 2015-03-02 LAB — BASIC METABOLIC PANEL
ANION GAP: 10 (ref 5–15)
BUN: 23 mg/dL — ABNORMAL HIGH (ref 6–20)
CALCIUM: 8.1 mg/dL — AB (ref 8.9–10.3)
CHLORIDE: 104 mmol/L (ref 101–111)
CO2: 29 mmol/L (ref 22–32)
Creatinine, Ser: 0.97 mg/dL (ref 0.44–1.00)
GFR calc Af Amer: >60 mL/min (ref >60)
GFR calc non Af Amer: 55 mL/min — ABNORMAL LOW (ref >60)
GLUCOSE: 116 mg/dL — AB (ref 65–99)
Potassium: 3.1 mmol/L — ABNORMAL LOW (ref 3.5–5.1)
Sodium: 143 mmol/L (ref 135–145)

## 2015-03-02 MED ORDER — WARFARIN SODIUM 3 MG PO TABS
3.0000 mg | ORAL_TABLET | Freq: Once | ORAL | Status: AC
  Administered 2015-03-02: 3 mg via ORAL
  Filled 2015-03-02: qty 1

## 2015-03-02 NOTE — Progress Notes (Signed)
ANTICOAGULATION CONSULT NOTE - Initial Consult  Pharmacy Consult for Warfarin for atrial fibrillation  No Known Allergies  Patient Measurements: Height: 5\' 7"  (170.2 cm) Weight: 127 lb (57.607 kg) IBW/kg (Calculated) : 61.6  Vital Signs: Temp: 97.8 F (36.6 C) (12/20 0952) Temp Source: Oral (12/20 0952) BP: 102/55 mmHg (12/20 1040) Pulse Rate: 73 (12/20 1040)  Labs:  Recent Labs  03/01/15 1314 03/02/15 0420  HGB 11.1* 9.1*  HCT 34.9* 27.8*  PLT 398 281  LABPROT 14.1 14.6  INR 1.07 1.12  CREATININE 0.94 0.97    Estimated Creatinine Clearance: 43.5 mL/min (by C-G formula based on Cr of 0.97).   Medical History: Past Medical History  Diagnosis Date  . Ulcerative colitis ([place])   . Hypothyroidism   . Hypertension     pcp   dr reed   [facility]  . Pneumonia 2008  . Chronic bronchitis ([place]) 10/31/2011    "prone to it; I have sinus/bronchitis problem probably q yr"  . Multiple skin tears 10/31/2011    "get them very easily; my skin is thin and I bruise easily"  . Chronic back pain     "my entire spine"  . Edema   . Other and unspecified hyperlipidemia   . Encounter for long-term (current) use of other medications   . Senile osteoporosis   . Spinal stenosis, unspecified region other than cervical   . Unspecified glaucoma   . Hypothyroidism   . Anxiety     sometimes   . Rheumatoid arthritis(714.0)     degenerative disc disease   . Swelling of right upper extremity 11/26/2014  . Diarrhea 01/27/2015  . Complication of anesthesia 2009    htn with block- for hand surgery   . Dysrhythmia     pt states taking coumadin for paroxysmal atrial fibrillation   . Peripheral vascular disease ([place])   . Depression   . Anemia   . History of blood transfusion   . Hard of hearing   . Dizziness   . Falls   . H/O clavicle fracture     left    Medications:  Scheduled:  . balsalazide  2,250 mg Oral BID  . celecoxib  200 mg Oral Q12H  . dexamethasone  10 mg  Intravenous Once  . enoxaparin (LOVENOX) injection  40 mg Subcutaneous Q24H  . estradiol  0.5 mg Oral QHS  . ferrous sulfate  325 mg Oral TID PC  . folic acid  2 mg Oral Daily  . furosemide  40 mg Oral Daily  . latanoprost  1 drop Both Eyes QHS  . levothyroxine  88 mcg Oral QAC breakfast  . medroxyPROGESTERone  2.5 mg Oral QHS  . metoprolol succinate  25 mg Oral BID  . oxyCODONE  5-15 mg Oral Q4H  . predniSONE  5 mg Oral Q breakfast  . vancomycin  750 mg Intravenous Q24H  . Warfarin - Pharmacist Dosing Inpatient   Does not apply q1800   Infusions:  . sodium chloride 0.9 % 1,000 mL with potassium chloride 10 mEq infusion 20 mL/hr at 03/02/15 0200   PRN: acetaminophen **OR** acetaminophen, alum & mag hydroxide-simeth, bisacodyl, diazepam, diphenhydrAMINE, docusate sodium, HYDROmorphone (DILAUDID) injection, magnesium citrate, menthol-cetylpyridinium **OR** phenol, methocarbamol **OR** methocarbamol (ROBAXIN)  IV, metoCLOPramide **OR** metoCLOPramide (REGLAN) injection, ondansetron **OR** ondansetron (ZOFRAN) IV, polyethylene glycol, polyvinyl alcohol, simethicone  Assessment: 80 yo female s/p resection of left TKA with placement of antibiotic spacers. On chronic warfarin for history of afib.  Home dose  2mg  daily, last dose was to be taken 12/12 in preparation for surgery.  Pharmacy is consulted to resume warfarin dosing post-op and dose vancomycin for infection knee.   Today, 03/02/2015: - INR 1.12 - hgb down 9.1 but maybe secondary to ABLA, plt wnl - no bleeding documented - no significant drug-drug intxns - ate 100% of breakfast this morning  Goal of Therapy:  INR 2-3 Monitor platelets by anticoagulation protocol: Yes  Plan: - warfarin 3 mg PO x1 today - Per MD, Lovenox 40mg  SQ q24h until INR >/= 1.8  - daily INR   Dia Sitter, PharmD, BCPS 03/02/2015 12:55 PM

## 2015-03-02 NOTE — Op Note (Signed)
[name], [name] NO.:  [id number]  MEDICAL RECORD NO.:  [id number]  LOCATION:  [id number]                         [facility]  PHYSICIAN:  Pietro Cassis. Alvan Dame, M.D.  DATE OF BIRTH:  04-06-1934  DATE OF PROCEDURE:  03/01/2015 DATE OF DISCHARGE:                              OPERATIVE REPORT   PREOPERATIVE DIAGNOSIS:  Infected left total knee arthroplasty.  POSTOPERATIVE DIAGNOSIS:  Infected left total knee arthroplasty.  PROCEDURE:  Resection of left total knee arthroplasty, placement of antibiotic spacer.  Caveat was that the femoral component, polyethylene insert, and patella were removed.  The tibial component was unable to be removed safely, thus was retained.  Cement spacer included the use of 2 g of vancomycin and 2.4 g of tobramycin.  SURGEON:  Pietro Cassis. Alvan Dame, M.D.  ASSISTANT:  [name] Orleans, PA-C.  Noted that [name] was present for the entirety of the case from preoperative position, perioperative management, operative extremity, general facilitation of the case, and primary wound closure.  ANESTHESIA:  General.  SPECIMENS:  Fluid from the joint was taken.  The patient was noted to have a non-cloudy, non-purulent, darkened synovial fluid.  There would have the appearance grossly of that of a Streptococcus type infection, but not a large purulence.  Overall, the knee did not appear to be grossly infected at the time of the procedure.  DRAINS:  None.  COMPLICATIONS:  None.  TOURNIQUET TIME:  Noted to be 70 minutes at 250 mmHg.  INDICATION FOR PROCEDURES:  [name] is a 80 year old female with multiple medical comorbidities, but most significant for rheumatoid arthritis, on antirheumatic medication.  She had a total knee arthroplasty, had done well for the first 3-4 months, and then presented with a lower extremity cellulitis associated with leg wound.  She developed a concern for a septic left total knee arthroplasty and was taken  to the operating room, and we had performed an attempted salvage procedure with an I and D, poly exchange.  After 6 weeks of antibiotics and oral antibiotics, which were difficult for to tolerate, she unfortunately had a recurrence of pain and swelling.  This is associated with some left lower extremity cellulitic changes.  Workup consisted of labs, which revealed a significant elevated C-reactive protein and sed rate.  After consultation with Infectious Disease, it was felt that it was best to treat this surgically.  We discussed the risks and benefits.  At this point, I proceeded with exchange arthroplasty in two-stage fashion.  Despite the fact that this would have significant implications in her functional capability and comorbidities, we felt this was in her best care of this need.  Risks of recurrent infection, DVT, component failure, need for future revision surgery all discussed as well as the plan of proceeding with reimplantation after labs had normalized at 2- to 47-month interval. Infectious Disease was on board of this plan.  No antibiotics were given prior to her scheduled surgery today.  PROCEDURE IN DETAIL:  The patient was brought to the operative theater. Once adequate anesthesia was established, she was positioned supine with left thigh tourniquet placed.  The left lower extremity was then prepped and draped in a  sterile fashion.  Time-out was performed identifying the patient, planned procedure, and extremity.  The left lower extremity exsanguinated, tourniquet elevated to 250 mmHg. The patient's old incision was excised and extended slightly proximal and distal.  Soft tissue exposure was carried out.  Median arthrotomy was made.  At this point, she was noted to have approximately 15-20 mL of brownish-looking fluid in the joint and this did not appear to be hemosiderin stained, but did not have any obvious signs of purulence of a staph infection, more perhaps close  to a strep infection.  I did take a syringe full of fluid and send it to Pathology for Gram stain, culture, cell count.  I also sent swabs for the same for aerobic and anaerobic cultures.  At this point, I began working on excision of the implants as was previously discussed as our primary plan.  I first used a thin ACL oscillating saw and undermined the bone-cement interface on the tibia surface.  Once I did this, I attended to the femur.  The same interface was undermined using this ACL saw.  The femoral component was then removed using osteotomes with minimal to no bone loss.  I returned back to the tibia tension and despite the attempts to try to loosen up the proximal side with full circumferential exposure, I used an osteotome.  I was unable to get it to budge, in fact we created a transverse nearly circumferential cracker on the proximal tibia.  After several attempts, I tried to dissociate the metal components of the tibial tray from the sleeve as well as the bone and cement interface.  I felt that it was in her best interest for me not to continue to do this. I was very worried at this point that the proximal tibia would come off with complete disassociate creating a significant challenge not only at this point in time for her but also in the postoperative period.  I elected to keep this tibial tray in place.  For this reason, I went ahead and irrigated the wound.  We used 6 L of normal saline solution for irrigation.  I did use a canal brush irrigator to irrigate the femoral canal.  I then prepared on the back table, a cemented mold using cement and 2 g of vancomycin and 2.4 g of tobramycin.  The cement mold was held and placed to fully cure on the distal femur and I elected to just place a polyethylene insert back into the knee for the cement, so would not articulate with the metal component of the tibial tray to provide protection.  I think this was less than an ideal  situation, however, due to the potential comorbidity of a proximal tibia fracture and the nature which was going to occur, I felt this was in her best interest.  Once the cement mold had fully cured and the final tibial component insert placed, we re-irrigated the knee with normal saline solution.  The extensor mechanism was reapproximated using interrupted #1 PDS suture. The remainder of wound was closed with 2-0 Vicryl and staples on the skin.  The skin was cleaned, dried, and dressed sterilely using Xeroform and a bulky sterile wrap.  She was brought to the recovery room in stable condition, tolerating the procedure well.  A knee immobilizer placed.  She will be touchdown in weightbearing despite the proximal tibia crack.  I will review these findings with her husband.  We will have Infectious Disease consulted for their assistance in  treatment.     Pietro Cassis Alvan Dame, M.D.     MDO/MEDQ  D:  03/01/2015  T:  03/02/2015  Job:  [id number]

## 2015-03-02 NOTE — Care Management Note (Signed)
Case Management Note  Patient Details  Name: [name] MRN: [id number] Date of Birth: 04-01-1934  Subjective/Objective:    S/p Resection of left total knee arthroplasty, placement of antibiotic spacer                Action/Plan: Discharge planning per CSW  Expected Discharge Date:  03/03/15               Expected Discharge Plan:  Skilled Nursing Facility  In-House Referral:  Clinical Social Work  Discharge planning Services  CM Consult  Post Acute Care Choice:  NA Choice offered to:  NA  DME Arranged:  N/A DME Agency:  NA  HH Arranged:  NA HH Agency:  NA  Status of Service:  Completed, signed off  Medicare Important Message Given:    Date Medicare IM Given:    Medicare IM give by:    Date Additional Medicare IM Given:    Additional Medicare Important Message give by:     If discussed at [place] of Stay Meetings, dates discussed:    Additional Comments:  Guadalupe Maple, RN 03/02/2015, 11:28 AM

## 2015-03-02 NOTE — Progress Notes (Signed)
Three attempts to place a PICC line in the right basilic (2) and cephalic (1) veins. Vein cannulated with good blood return but unable to thread the guide wire. Junius Argyle RN notified and is aware the PICC team will make another attempt to place a PICC line on 03/02/15. [place]

## 2015-03-02 NOTE — Progress Notes (Signed)
Physical Therapy Treatment Note    03/02/15 1530  PT Visit Information  Last PT Received On 03/02/15  Assistance Needed +2  History of Present Illness Pt s/p resection left total knee arthroplasty with placement of antibiotic spacers.  PMHx of RA, back surgery, L TKA 06/2014 and L knee I&D 10/2014, L clavicle fx  PT Time Calculation  PT Start Time (ACUTE ONLY) 1420  PT Stop Time (ACUTE ONLY) 1439  PT Time Calculation (min) (ACUTE ONLY) 19 min  Subjective Data  Subjective Pt assisted back to bed.  Pt able to assist a little better this afternoon with transfer however continues to require step by step cues and increased assist.  Pt concerned about having BM (reports hx of ulcerative colitis) so assisted onto bed pan and notified not to remain on bed pan waiting if not successful.  (aware to call for assist off bed pan and for any mobility)  Precautions  Precautions Fall;Knee  Precaution Comments cement spacer  Required Braces or Orthoses Knee Immobilizer - Left  Knee Immobilizer - Left On at all times  Restrictions  Weight Bearing Restrictions Yes  LLE Weight Bearing TWB  Pain Assessment  Pain Assessment 0-10  Pain Score 6  Pain Location L proximial tibia  Pain Descriptors / Indicators Sore  Pain Intervention(s) Limited activity within patient's tolerance;Monitored during session  Cognition  Arousal/Alertness Awake/alert  Behavior During Therapy [facility] for tasks assessed/performed  Overall Cognitive Status Within Functional Limits for tasks assessed  Bed Mobility  Overal bed mobility +2 for physical assistance;Needs Assistance  Bed Mobility Sit to Supine  Sit to supine Min assist  General bed mobility comments verbal cues for technique, assist for L LE  Transfers  Overall transfer level Needs assistance  Equipment used Rolling walker (2 wheeled)  Transfers Sit to/from Stand  Sit to Stand Mod assist;+2 physical assistance  Stand pivot transfers Mod assist;+2 physical assistance   General transfer comment pt required mod cues for TDWB, had pt keep foot slightly forward to assist with keeping weight off LE, step by step cues for technique required, increased assist required for steadying and turning  Exercises  Exercises General Lower Extremity  General Exercises - Lower Extremity  Hip ABduction/ADduction AROM;5 reps;Supine  PT - End of Session  Equipment Utilized During Treatment Gait belt;Left knee immobilizer  Activity Tolerance Patient limited by fatigue;Patient limited by pain  Patient left in bed;with call bell/phone within reach;with bed alarm set  Nurse Communication Mobility status;Other (comment) (pt on bed pan)  PT - Assessment/Plan  PT Plan Current plan remains appropriate  PT Frequency (ACUTE ONLY) 7X/week  Follow Up Recommendations SNF;Supervision/Assistance - 24 hour  PT equipment Wheelchair (measurements PT)  PT Goal Progression  Progress towards PT goals Progressing toward goals  PT General Charges  $$ ACUTE PT VISIT 1 Procedure  PT Treatments  $Therapeutic Activity 8-22 mins   Carmelia Bake, PT, DPT 03/02/2015 Pager: [phone]

## 2015-03-02 NOTE — Clinical Social Work Placement (Signed)
   CLINICAL SOCIAL WORK PLACEMENT  NOTE  Date:  03/02/2015  Patient Details  Name: [name] MRN: [id number] Date of Birth: [birth year]  Clinical Social Work is seeking post-discharge placement for this patient at the [place] level of care (*CSW will initial, date and re-position this form in  chart as items are completed):  No   Patient/family provided with [place] Work Department's list of facilities offering this level of care within the geographic area requested by the patient (or if unable, by the patient's family).  Yes   Patient/family informed of their freedom to choose among providers that offer the needed level of care, that participate in Medicare, Medicaid or managed care program needed by the patient, have an available bed and are willing to accept the patient.  Yes   Patient/family informed of [facility]'s ownership interest in [facility] and [facility], as well as of the fact that they are under no obligation to receive care at these facilities.  PASRR submitted to EDS on       PASRR number received on       Existing PASRR number confirmed on 03/02/15     FL2 transmitted to all facilities in geographic area requested by pt/family on 03/02/15     FL2 transmitted to all facilities within larger geographic area on       Patient informed that his/her managed care company has contracts with or will negotiate with certain facilities, including the following:            Patient/family informed of bed offers received.  Patient chooses bed at       Physician recommends and patient chooses bed at      Patient to be transferred to   on  .  Patient to be transferred to facility by       Patient family notified on   of transfer.  Name of family member notified:        PHYSICIAN       Additional Comment:    _______________________________________________ [name], [place] 03/02/2015, 9:12  AM

## 2015-03-02 NOTE — Clinical Social Work Note (Signed)
Clinical Social Work Assessment  Patient Details  Name: [name] MRN: [id number] Date of Birth: Jul 16, 1934  Date of referral:  03/02/15               Reason for consult:  Facility Placement, Discharge Planning                Permission sought to share information with:  Chartered certified accountant granted to share information::  Yes, Verbal Permission Granted  Name::        Agency::     Relationship::     Contact Information:     Housing/Transportation Living arrangements for the past 2 months:  Single Family Home Source of Information:  Patient Patient Interpreter Needed:  None Criminal Activity/Legal Involvement Pertinent to Current Situation/Hospitalization:  No - Comment as needed Significant Relationships:    Lives with:  Spouse Do you feel safe going back to the place where you live?   (ST Rehab is needed.) Need for family participation in patient care:     Care giving concerns:  Pt's care cannot be managed at home following hospital d/c.   Social Worker assessment / plan:  Pt hospitalized on 03/01/15 for pre planned left total knee arthroplasty with placement of antibiotic spacers. CSW met with pt to assist with d/c planning. ST Rehab will be needed at d/c. Pt has requested placement at [facility] ( [facility] ). SNF contacted and CSW is waiting for decision from SNF. [facility] search initiated, with pt's permission, for additional options in case [facility] cannot assist. Bed offers pending. CSW will continue to follow to assist with d/c planning to SNF.  Employment status:  Retired Nurse, adult PT Recommendations:  Not assessed at this time Information / Referral to community resources:  [place]  Patient/Family's Response to care:  Pt feels placement is needed at d/c.  Patient/Family's Understanding of and Emotional Response to Diagnosis, Current Treatment, and Prognosis: Pt is aware of her medical status. " It  was frustrating waiting 6 wks for this surgery. I'm still going to need another surgery. " Pt has been to [facility] in the past and is hopeful they will have an opening for her at d/c. Support and reassurance provided.  Emotional Assessment Appearance:  Appears stated age Attitude/Demeanor/Rapport:  Other (cooperative) Affect (typically observed):  Frustrated Orientation:  Oriented to Self, Oriented to Place, Oriented to  Time, Oriented to Situation Alcohol / Substance use:  Not Applicable Psych involvement (Current and /or in the community):  No (Comment)  Discharge Needs  Concerns to be addressed:  Discharge Planning Concerns Readmission within the last 30 days:  No Current discharge risk:  None Barriers to Discharge:  No Barriers Identified   [name], [place] 03/02/2015, 9:04 AM

## 2015-03-02 NOTE — NC FL2 (Signed)
[place] MEDICAID FL2 LEVEL OF CARE SCREENING TOOL     IDENTIFICATION  Patient Name: [name] Birthdate: February 05, 1935 Sex: female Admission Date (Current Location): 03/01/2015  [facility] and [place] Number:  Herbalist and Address:  [facility],  [place] [address], [place]      Provider Number: [id number]  Attending Physician Name and Address:  Paralee Cancel, MD  Relative Name and Phone Number:       Current Level of Care: Hospital Recommended Level of Care: [place] Prior Approval Number:    Date Approved/Denied:   PASRR Number: [id number]  Discharge Plan: SNF    Current Diagnoses: Patient Active Problem List   Diagnosis Date Noted  . Left TK resection / spacer 03/01/2015  . Diarrhea 01/27/2015  . Atrial fibrillation ([place]) [I48.91] 12/14/2014  . Long term (current) use of anticoagulants [Z79.01] 12/14/2014  . Adhesive capsulitis of right shoulder 12/10/2014  . Swelling of right upper extremity 11/26/2014  . Acute on chronic diastolic CHF (congestive heart failure), NYHA class 1 ([place]) 10/29/2014  . Cellulitis 10/29/2014  . Anemia, iron deficiency 10/29/2014  . Infection of prosthetic left knee joint ([place]) 10/13/2014  . Benign essential HTN   . S/P left TKA 06/16/2014  . Cervical spondylosis with myelopathy 02/17/2014  . Chronic venous stasis dermatitis of both lower extremities 09/08/2013  . Lumbar stenosis with neurogenic claudication 08/05/2013  . Hypothyroidism   . Rheumatoid arthritis ([place]) 11/22/2011  . Ulcerative colitis, chronic ([place]) 11/22/2011    Orientation RESPIRATION BLADDER Height & Weight    Self, Time, Situation, Place  Normal Continent      BEHAVIORAL SYMPTOMS/MOOD NEUROLOGICAL BOWEL NUTRITION STATUS  Other (Comment) (no behaviors)   Continent Diet  AMBULATORY STATUS COMMUNICATION OF NEEDS Skin   Extensive Assist Verbally Surgical wounds                       Personal Care  Assistance Level of Assistance  Bathing, Feeding, Dressing Bathing Assistance: Limited assistance Feeding assistance: Independent Dressing Assistance: Limited assistance     Functional Limitations Info  Sight, Hearing, Speech Sight Info: Adequate Hearing Info: Adequate Speech Info: Adequate    SPECIAL CARE FACTORS FREQUENCY  PT (By licensed PT), OT (By licensed OT)     PT Frequency: 5 x wk OT Frequency: 5 x wk            Contractures Contractures Info: Not present    Additional Factors Info  Code Status Code Status Info: Full Code             Current Medications (03/02/2015):  This is the current hospital active medication list Current Facility-Administered Medications  Medication Dose Route Frequency Provider Last Rate Last Dose  . acetaminophen (TYLENOL) tablet 650 mg  650 mg Oral Q6H PRN Danae Orleans, PA-C       Or  . acetaminophen (TYLENOL) suppository 650 mg  650 mg Rectal Q6H PRN Danae Orleans, PA-C      . alum & mag hydroxide-simeth (MAALOX/MYLANTA) 200-200-20 MG/5ML suspension 30 mL  30 mL Oral Q4H PRN Danae Orleans, PA-C      . balsalazide (COLAZAL) capsule 2,250 mg  2,250 mg Oral BID Danae Orleans, PA-C   2,250 mg at 03/01/15 2257  . bisacodyl (DULCOLAX) suppository 10 mg  10 mg Rectal Daily PRN Danae Orleans, PA-C      . celecoxib (CELEBREX) capsule 200 mg  200 mg Oral Q12H Danae Orleans, PA-C  200 mg at 03/01/15 2257  . dexamethasone (DECADRON) injection 10 mg  10 mg Intravenous Once Danae Orleans, PA-C      . diazepam (VALIUM) tablet 5 mg  5 mg Oral Q6H PRN Danae Orleans, PA-C      . diphenhydrAMINE (BENADRYL) capsule 25 mg  25 mg Oral Q6H PRN Danae Orleans, PA-C      . docusate sodium (COLACE) capsule 100 mg  100 mg Oral BID PRN Danae Orleans, PA-C      . enoxaparin (LOVENOX) injection 40 mg  40 mg Subcutaneous Q24H Danae Orleans, PA-C      . estradiol (ESTRACE) tablet 0.5 mg  0.5 mg Oral QHS Danae Orleans, PA-C   0.5 mg at 03/01/15 2257   . ferrous sulfate tablet 325 mg  325 mg Oral TID PC Danae Orleans, PA-C   325 mg at 03/01/15 2256  . folic acid (FOLVITE) tablet 2 mg  2 mg Oral Daily Danae Orleans, PA-C   2 mg at 03/01/15 1946  . furosemide (LASIX) tablet 40 mg  40 mg Oral Daily Danae Orleans, PA-C   40 mg at 03/01/15 1946  . HYDROmorphone (DILAUDID) injection 0.5-2 mg  0.5-2 mg Intravenous Q2H PRN Danae Orleans, PA-C      . latanoprost (XALATAN) 0.005 % ophthalmic solution 1 drop  1 drop Both Eyes QHS Danae Orleans, PA-C   1 drop at 03/01/15 2304  . levothyroxine (SYNTHROID, LEVOTHROID) tablet 88 mcg  88 mcg Oral QAC breakfast Danae Orleans, PA-C      . magnesium citrate solution 1 Bottle  1 Bottle Oral Once PRN Danae Orleans, PA-C      . medroxyPROGESTERone (PROVERA) tablet 2.5 mg  2.5 mg Oral QHS Danae Orleans, PA-C   2.5 mg at 03/01/15 2257  . menthol-cetylpyridinium (CEPACOL) lozenge 3 mg  1 lozenge Oral PRN Danae Orleans, PA-C       Or  . phenol (CHLORASEPTIC) mouth spray 1 spray  1 spray Mouth/Throat PRN Danae Orleans, PA-C      . methocarbamol (ROBAXIN) tablet 500 mg  500 mg Oral Q6H PRN Danae Orleans, PA-C       Or  . methocarbamol (ROBAXIN) 500 mg in dextrose 5 % 50 mL IVPB  500 mg Intravenous Q6H PRN Danae Orleans, PA-C   500 mg at 03/01/15 1801  . metoCLOPramide (REGLAN) tablet 5-10 mg  5-10 mg Oral Q8H PRN Danae Orleans, PA-C       Or  . metoCLOPramide (REGLAN) injection 5-10 mg  5-10 mg Intravenous Q8H PRN Danae Orleans, PA-C      . metoprolol succinate (TOPROL-XL) 24 hr tablet 25 mg  25 mg Oral BID Danae Orleans, PA-C   25 mg at 03/01/15 2258  . ondansetron (ZOFRAN) tablet 4 mg  4 mg Oral Q6H PRN Danae Orleans, PA-C       Or  . ondansetron [facility]) injection 4 mg  4 mg Intravenous Q6H PRN Danae Orleans, PA-C      . oxyCODONE (Oxy IR/ROXICODONE) immediate release tablet 5-15 mg  5-15 mg Oral Q4H Danae Orleans, PA-C   10 mg at 03/02/15 0754  . polyethylene glycol (MIRALAX / GLYCOLAX) packet 17  g  17 g Oral Daily PRN Danae Orleans, PA-C      . polyvinyl alcohol (LIQUIFILM TEARS) 1.4 % ophthalmic solution 1 drop  1 drop Both Eyes BID PRN Danae Orleans, PA-C      . predniSONE (DELTASONE) tablet 5 mg  5 mg Oral Q breakfast Danae Orleans, PA-C      .  simethicone (MYLICON) chewable tablet 160 mg  160 mg Oral Q8H PRN Danae Orleans, PA-C      . sodium chloride 0.9 % 1,000 mL with potassium chloride 10 mEq infusion   Intravenous Continuous Danae Orleans, PA-C 20 mL/hr at 03/02/15 0200    . vancomycin (VANCOCIN) IVPB 750 mg/150 ml premix  750 mg Intravenous Q24H Emiliano Dyer, RPH      . Warfarin - Pharmacist Dosing Inpatient   Does not apply [place], [facility]         Discharge Medications: Please see discharge summary for a list of discharge medications.  Relevant Imaging Results:  Relevant Lab Results:   Additional Information SS # [id number]  Keiron Iodice, Randall An, LCSW

## 2015-03-02 NOTE — Progress Notes (Signed)
OT Cancellation Note  Patient Details Name: [name] MRN: [id number] DOB: March 07, 1935   Cancelled Treatment:    Reason Eval/Treat Not Completed: OT screened, no needs identified, will sign off. Plan is for patient to discharge > SNF. No acute OT needs identified, all needs can be met in SNF. Please send text page to OT services if any questions, concerns, or with new orders: [phone] OR call office at [phone]. Thank you for the order.    Chrys Racer , MS, OTR/L, CLT Pager: [phone]  03/02/2015, 12:49 PM

## 2015-03-02 NOTE — Progress Notes (Signed)
Peripherally Inserted Central Catheter/Midline Placement  The IV Nurse has discussed with the patient and/or persons authorized to consent for the patient, the purpose of this procedure and the potential benefits and risks involved with this procedure.  The benefits include less needle sticks, lab draws from the catheter and patient may be discharged home with the catheter.  Risks include, but not limited to, infection, bleeding, blood clot (thrombus formation), and puncture of an artery; nerve damage and irregular heat beat.  Alternatives to this procedure were also discussed.  PICC/Midline Placement Documentation  PICC / Midline Single Lumen 10/14/14 PICC Right Brachial 41 cm 4 cm (Active)       Marianna Payment M 03/02/2015, 6:45 PM

## 2015-03-02 NOTE — Evaluation (Signed)
Physical Therapy Evaluation Patient Details Name: [name] MRN: [id number] DOB: 1934-09-14 Today's Date: 03/02/2015   History of Present Illness  Pt s/p resection left total knee arthroplasty with placement of antibiotic spacers.  PMHx of RA, back surgery, L TKA 06/2014 and L knee I&D 10/2014, L clavicle fx  Clinical Impression  Patient is s/p above surgery resulting in functional limitations due to the deficits listed below (see PT Problem List).  Patient will benefit from skilled PT to increase their independence and safety with mobility to allow discharge to the venue listed below.  Pt requiring increased assist to stand and transfer.  Pt educated on TDWB however due to poor upper body strength has difficulty maintaining TDWB status despite max cues.  Pt would benefit from SNF upon d/c.     Follow Up Recommendations SNF;Supervision/Assistance - 24 hour    Equipment Recommendations  Wheelchair (measurements PT)    Recommendations for Other Services       Precautions / Restrictions Precautions Precautions: Fall;Knee Precaution Comments: cement spacer Required Braces or Orthoses: Knee Immobilizer - Left Knee Immobilizer - Left: On at all times Restrictions Weight Bearing Restrictions: Yes LLE Weight Bearing: Touchdown weight bearing      Mobility  Bed Mobility Overal bed mobility: Needs Assistance Bed Mobility: Supine to Sit     Supine to sit: Min assist     General bed mobility comments: verbal cues for self assist, support/assist for L LE, increased time and effort  Transfers Overall transfer level: Needs assistance Equipment used: Rolling walker (2 wheeled) Transfers: Sit to/from [facility] Sit to Stand: Mod assist;Max assist;+2 physical assistance;From elevated surface Stand pivot transfers: Mod assist;Max assist;+2 physical assistance       General transfer comment: max verbal cues for TDWB, assist to rise, steady and control descent,  step by step cues for safe technique  Ambulation/Gait                Stairs            Wheelchair Mobility    Modified Rankin (Stroke Patients Only)       Balance Overall balance assessment: Needs assistance         Standing balance support: Bilateral upper extremity supported Standing balance-Leahy Scale: Zero                               Pertinent Vitals/Pain Pain Assessment: 0-10 Pain Score: 5  Pain Location: L proximal tibia Pain Descriptors / Indicators: Sore Pain Intervention(s): Limited activity within patient's tolerance;Monitored during session;Repositioned  Pt with low BP this morning however improved slightly upon sitting EOB, pt does reports mild "wooziness" with mobility.  BP's documented in flowsheets and RN notified.    Home Living Family/patient expects to be discharged to:: Skilled nursing facility Living Arrangements: Spouse/significant other                    Prior Function Level of Independence: Needs assistance               Hand Dominance        Extremity/Trunk Assessment   Upper Extremity Assessment: Generalized weakness;Defer to OT evaluation (hx L clavicle fx, pt reports poor upper body strength)           Lower Extremity Assessment: Generalized weakness;LLE deficits/detail   LLE Deficits / Details: maintained KI, able to perform ankle pumps, L ankle/foot edema  Communication   Communication: HOH  Cognition Arousal/Alertness: Awake/alert Behavior During Therapy: WFL for tasks assessed/performed Overall Cognitive Status: Within Functional Limits for tasks assessed                      General Comments      Exercises        Assessment/Plan    PT Assessment Patient needs continued PT services  PT Diagnosis Generalized weakness;Acute pain;Difficulty walking   PT Problem List Decreased strength;Decreased activity tolerance;Decreased balance;Decreased mobility;Decreased  knowledge of precautions;Pain  PT Treatment Interventions DME instruction;Gait training;Functional mobility training;Patient/family education;Therapeutic activities;Wheelchair mobility training;Therapeutic exercise;Balance training   PT Goals (Current goals can be found in the Care Plan section) Acute Rehab PT Goals PT Goal Formulation: With patient Time For Goal Achievement: 03/09/15 Potential to Achieve Goals: Good    Frequency 7X/week   Barriers to discharge        Co-evaluation               End of Session Equipment Utilized During Treatment: Gait belt;Left knee immobilizer Activity Tolerance: Patient limited by fatigue;Patient limited by pain Patient left: in chair;with call bell/phone within reach Nurse Communication: Mobility status         Time: [id number] PT Time Calculation (min) (ACUTE ONLY): 24 min   Charges:   PT Evaluation $Initial PT Evaluation Tier I: 1 Procedure PT Treatments $Therapeutic Activity: 8-22 mins   PT G Codes:        Manjinder Breau,KATHrine E 03/02/2015, 12:07 PM Carmelia Bake, PT, DPT 03/02/2015 Pager: [phone]

## 2015-03-02 NOTE — Progress Notes (Signed)
Patient ID: AMERICA CALICA, female   DOB: 10/04/1934, 80 y.o.   MRN: [id number] Subjective: 1 Day Post-Op Procedure(s) (LRB): RESECTION LEFT TOTAL KNEE ARTHROPLASTY WITH PLACEMENT OF  ANTIBIOTIC SPACERS (Left)    Patient reports pain as mild.  Relatively comfortable.  No events.  Reviewed intra-operative findings and procedure according to findings  Objective:   VITALS:   Filed Vitals:   03/02/15 0952 03/02/15 0955  BP: 83/45 86/37  Pulse: 65   Temp: 97.8 F (36.6 C)   Resp: 16     Neurovascular intact Incision: dressing C/D/I  LABS  Recent Labs  03/01/15 1314 03/02/15 0420  HGB 11.1* 9.1*  HCT 34.9* 27.8*  WBC 6.8 12.0*  PLT 398 281     Recent Labs  03/01/15 1314 03/02/15 0420  NA  --  143  K  --  3.1*  BUN  --  23*  CREATININE 0.94 0.97  GLUCOSE  --  116*     Recent Labs  03/01/15 1314 03/02/15 0420  INR 1.07 1.12     Assessment/Plan: 1 Day Post-Op Procedure(s) (LRB): RESECTION LEFT TOTAL KNEE ARTHROPLASTY WITH PLACEMENT OF  ANTIBIOTIC SPACERS (Left)   Advance diet Up with therapy Discharge to SNF  PIC line orderd Will be consulting ID for assistance in management Cultures from OR pending

## 2015-03-03 DIAGNOSIS — T8454XA Infection and inflammatory reaction due to internal left knee prosthesis, initial encounter: Principal | ICD-10-CM

## 2015-03-03 DIAGNOSIS — B9689 Other specified bacterial agents as the cause of diseases classified elsewhere: Secondary | ICD-10-CM

## 2015-03-03 DIAGNOSIS — Z9889 Other specified postprocedural states: Secondary | ICD-10-CM

## 2015-03-03 LAB — BASIC METABOLIC PANEL
ANION GAP: 9 (ref 5–15)
BUN: 31 mg/dL — ABNORMAL HIGH (ref 6–20)
CALCIUM: 7.4 mg/dL — AB (ref 8.9–10.3)
CO2: 28 mmol/L (ref 22–32)
CREATININE: 1.18 mg/dL — AB (ref 0.44–1.00)
Chloride: 106 mmol/L (ref 101–111)
GFR, EST AFRICAN AMERICAN: 50 mL/min — AB (ref >60)
GFR, EST NON AFRICAN AMERICAN: 43 mL/min — AB (ref >60)
GLUCOSE: 96 mg/dL (ref 65–99)
Potassium: 2.9 mmol/L — ABNORMAL LOW (ref 3.5–5.1)
Sodium: 143 mmol/L (ref 135–145)

## 2015-03-03 LAB — CBC
HCT: 25.3 % — ABNORMAL LOW (ref 36.0–46.0)
Hemoglobin: 8.1 g/dL — ABNORMAL LOW (ref 12.0–15.0)
MCH: 28.7 pg (ref 26.0–34.0)
MCHC: 32 g/dL (ref 30.0–36.0)
MCV: 89.7 fL (ref 78.0–100.0)
PLATELETS: 242 10*3/uL (ref 150–400)
RBC: 2.82 MIL/uL — ABNORMAL LOW (ref 3.87–5.11)
RDW: 17 % — AB (ref 11.5–15.5)
WBC: 7.7 10*3/uL (ref 4.0–10.5)

## 2015-03-03 LAB — PROTIME-INR
INR: 1.32 (ref 0.00–1.49)
Prothrombin Time: 16.5 seconds — ABNORMAL HIGH (ref 11.6–15.2)

## 2015-03-03 MED ORDER — SODIUM CHLORIDE 0.9 % IJ SOLN
10.0000 mL | INTRAMUSCULAR | Status: DC | PRN
  Administered 2015-03-04 (×2): 10 mL
  Filled 2015-03-03 (×2): qty 40

## 2015-03-03 MED ORDER — DEXTROSE 5 % IV SOLN
2.0000 g | INTRAVENOUS | Status: DC
  Administered 2015-03-03: 2 g via INTRAVENOUS
  Filled 2015-03-03 (×2): qty 2

## 2015-03-03 MED ORDER — WARFARIN SODIUM 3 MG PO TABS
3.0000 mg | ORAL_TABLET | Freq: Once | ORAL | Status: AC
  Administered 2015-03-03: 3 mg via ORAL
  Filled 2015-03-03: qty 1

## 2015-03-03 NOTE — Consult Note (Signed)
[facility]       Reason for Consult: Prosthetic joint infection    Referring Physician: Dr. Alvan Dame  Principal Problem:   Left TK resection / spacer   . balsalazide  2,250 mg Oral BID  . celecoxib  200 mg Oral Q12H  . dexamethasone  10 mg Intravenous Once  . enoxaparin (LOVENOX) injection  40 mg Subcutaneous Q24H  . estradiol  0.5 mg Oral QHS  . ferrous sulfate  325 mg Oral TID PC  . folic acid  2 mg Oral Daily  . furosemide  40 mg Oral Daily  . latanoprost  1 drop Both Eyes QHS  . levothyroxine  88 mcg Oral QAC breakfast  . medroxyPROGESTERone  2.5 mg Oral QHS  . metoprolol succinate  25 mg Oral BID  . oxyCODONE  5-15 mg Oral Q4H  . predniSONE  5 mg Oral Q breakfast  . vancomycin  750 mg Intravenous Q24H  . warfarin  3 mg Oral ONCE-1800  . Warfarin - Pharmacist Dosing Inpatient   Does not apply q1800    Recommendations: Vancomycin per pharmacy with trough goal of 15-20 Ceftriaxone 2 grams daily Both for 6 weeks minimum We will arrange follow up with Dr. 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  Now s/p resection arthroplasty, though some retained tibial component, spacer placement with 2 grams of vancomycin and 2.4 g tobramycin.   Antibiotics: vancomycin  HPI: [name] is a 80 y.o. female with rheumatoid arthritis, ulcerative colitis with initial TKA 06/16/2014 then infection requiring I and D with polyexchange on 10/12/24 and treated with vancomycin, followed by doxycycline and Keflex who comes in now with increased swelling an redness and s/p resection arthroplasty 12/19.  Tibial component was not able to be removed.  Had no associated fever or chills, no overt pus noted in OR.  Previous hospitalization and history reviewed.  No previous positive cultures.  Also no positive cultures now to date.   Review of  Systems:  Constitutional: negative for fevers and chills Gastrointestinal: negative for nausea and diarrhea All other systems reviewed and are negative   Past Medical History  Diagnosis Date  . Ulcerative colitis ([place])   . Hypothyroidism   . Hypertension     pcp   dr reed   [facility]  . Pneumonia 2008  . Chronic bronchitis ([place]) 10/31/2011    "prone to it; I have sinus/bronchitis problem probably q yr"  . Multiple skin tears 10/31/2011    "get them very easily; my skin is thin and I bruise easily"  . Chronic back pain     "my entire spine"  . Edema   . Other and unspecified hyperlipidemia   . Encounter for long-term (current) use of other medications   . Senile osteoporosis   . Spinal stenosis, unspecified region other than cervical   . Unspecified glaucoma   . Hypothyroidism   . Anxiety     sometimes   . Rheumatoid arthritis(714.0)     degenerative disc disease   . Swelling of right upper extremity 11/26/2014  . Diarrhea 01/27/2015  . Complication of anesthesia 2009    htn with block- for hand surgery   . Dysrhythmia     pt states taking coumadin for paroxysmal atrial fibrillation   . Peripheral vascular disease ([place])   .  Depression   . Anemia   . History of blood transfusion   . Hard of hearing   . Dizziness   . Falls   . H/O clavicle fracture     left    Social History  Substance Use Topics  . Smoking status: Current Every Day Smoker -- 0.25 packs/day for 50 years    Types: Cigarettes  . Smokeless tobacco: Never Used     Comment: 10/31/2011  offered smoking cessation materials; pt declines; "I puff; I've never really inhaled"  . Alcohol Use: 2.4 oz/week    2 Glasses of wine, 2 Cans of beer per week     Comment: 10/31/2011 "1 beers/wine with dinner daily ; an occasional scotch"    Family History  Problem Relation Age of Onset  . Heart disease Mother   . Cancer Father     lung    No Known Allergies  Physical Exam: Constitutional: in no apparent  distress and alert  Filed Vitals:   03/03/15 0422 03/03/15 0800  BP: 100/44 114/55  Pulse: 57 94  Temp: 99.7 F (37.6 C)   Resp: 16    EYES: anicteric ENMT: no thrush Cardiovascular: Cor RRR and No murmurs Respiratory: CTA B; normal respiratory effort GI: Bowel sounds are normal, liver is not enlarged, spleen is not enlarged Musculoskeletal: arthritic changes Skin: negatives: no rash Hematologic: no cervical lad  Lab Results  Component Value Date   WBC 7.7 03/03/2015   HGB 8.1* 03/03/2015   HCT 25.3* 03/03/2015   MCV 89.7 03/03/2015   PLT 242 03/03/2015    Lab Results  Component Value Date   CREATININE 1.18* 03/03/2015   BUN 31* 03/03/2015   NA 143 03/03/2015   K 2.9* 03/03/2015   CL 106 03/03/2015   CO2 28 03/03/2015    Lab Results  Component Value Date   ALT 15 12/25/2014   AST 41* 12/25/2014   ALKPHOS 114 12/25/2014     Microbiology: Recent Results (from the past 240 hour(s))  Surgical pcr screen     Status: None   Collection Time: 02/23/15  1:00 PM  Result Value Ref Range Status   MRSA, PCR NEGATIVE NEGATIVE Final   Staphylococcus aureus NEGATIVE NEGATIVE Final    Comment:        The Xpert SA Assay (FDA approved for NASAL specimens in patients over 51 years of age), is one component of a comprehensive surveillance program.  Test performance has been validated by [facility] for patients greater than or equal to 84 year old. It is not intended to diagnose infection nor to guide or monitor treatment.   Anaerobic culture     Status: None (Preliminary result)   Collection Time: 03/01/15  4:00 PM  Result Value Ref Range Status   Specimen Description SYNOVIAL LEFT KNEE  Final   Special Requests FLUID ON SWAB  Final   Gram Stain PENDING  Incomplete   Culture   Final    NO ANAEROBES ISOLATED; CULTURE IN PROGRESS FOR 5 DAYS Performed at [facility]    Report Status PENDING  Incomplete  Body fluid culture     Status: None (Preliminary result)    Collection Time: 03/01/15  4:00 PM  Result Value Ref Range Status   Specimen Description SYNOVIAL LEFT KNEE  Final   Special Requests FLUID ON SWAB  Final   Gram Stain   Final    MODERATE WBC PRESENT,BOTH PMN AND MONONUCLEAR NO ORGANISMS SEEN    Culture  Final    NO GROWTH 2 DAYS Performed at [facility]    Report Status PENDING  Incomplete    Scharlene Gloss, [facility] Infect[facility] [email or web address] [phone] pager  [phone] cell 03/03/2015, 2:35 PM

## 2015-03-03 NOTE — Progress Notes (Signed)
Peripherally Inserted Central Catheter/Midline Placement  The IV Nurse has discussed with the patient and/or persons authorized to consent for the patient, the purpose of this procedure and the potential benefits and risks involved with this procedure.  The benefits include less needle sticks, lab draws from the catheter and patient may be discharged home with the catheter.  Risks include, but not limited to, infection, bleeding, blood clot (thrombus formation), and puncture of an artery; nerve damage and irregular heat beat.  Alternatives to this procedure were also discussed.  PICC/Midline Placement Documentation  PICC / Midline Single Lumen 10/14/14 PICC Right Brachial 41 cm 4 cm (Active)       Holley Bouche Renee 03/03/2015, 8:08 AM

## 2015-03-03 NOTE — Progress Notes (Addendum)
ANTICOAGULATION CONSULT NOTE - Follow Up Consult  Pharmacy Consult for Warfarin for atrial fibrillation  No Known Allergies  Patient Measurements: Height: 5\' 7"  (170.2 cm) Weight: 127 lb (57.607 kg) IBW/kg (Calculated) : 61.6  Vital Signs: Temp: 99.7 F (37.6 C) (12/21 0422) Temp Source: Oral (12/21 0422) BP: 100/44 mmHg (12/21 0422) Pulse Rate: 57 (12/21 0422)  Labs:  Recent Labs  03/01/15 1314 03/02/15 0420 03/03/15 0425  HGB 11.1* 9.1* 8.1*  HCT 34.9* 27.8* 25.3*  PLT 398 281 242  LABPROT 14.1 14.6 16.5*  INR 1.07 1.12 1.32  CREATININE 0.94 0.97 1.18*    Estimated Creatinine Clearance: 35.7 mL/min (by C-G formula based on Cr of 1.18).   Medical History: Past Medical History  Diagnosis Date  . Ulcerative colitis ([place])   . Hypothyroidism   . Hypertension     pcp   dr reed   [facility]  . Pneumonia 2008  . Chronic bronchitis ([place]) 10/31/2011    "prone to it; I have sinus/bronchitis problem probably q yr"  . Multiple skin tears 10/31/2011    "get them very easily; my skin is thin and I bruise easily"  . Chronic back pain     "my entire spine"  . Edema   . Other and unspecified hyperlipidemia   . Encounter for long-term (current) use of other medications   . Senile osteoporosis   . Spinal stenosis, unspecified region other than cervical   . Unspecified glaucoma   . Hypothyroidism   . Anxiety     sometimes   . Rheumatoid arthritis(714.0)     degenerative disc disease   . Swelling of right upper extremity 11/26/2014  . Diarrhea 01/27/2015  . Complication of anesthesia 2009    htn with block- for hand surgery   . Dysrhythmia     pt states taking coumadin for paroxysmal atrial fibrillation   . Peripheral vascular disease ([place])   . Depression   . Anemia   . History of blood transfusion   . Hard of hearing   . Dizziness   . Falls   . H/O clavicle fracture     left    Medications:  Scheduled:  . balsalazide  2,250 mg Oral BID  . celecoxib  200  mg Oral Q12H  . dexamethasone  10 mg Intravenous Once  . enoxaparin (LOVENOX) injection  40 mg Subcutaneous Q24H  . estradiol  0.5 mg Oral QHS  . ferrous sulfate  325 mg Oral TID PC  . folic acid  2 mg Oral Daily  . furosemide  40 mg Oral Daily  . latanoprost  1 drop Both Eyes QHS  . levothyroxine  88 mcg Oral QAC breakfast  . medroxyPROGESTERone  2.5 mg Oral QHS  . metoprolol succinate  25 mg Oral BID  . oxyCODONE  5-15 mg Oral Q4H  . predniSONE  5 mg Oral Q breakfast  . vancomycin  750 mg Intravenous Q24H  . Warfarin - Pharmacist Dosing Inpatient   Does not apply q1800   Infusions:  . sodium chloride 0.9 % 1,000 mL with potassium chloride 10 mEq infusion 20 mL/hr at 03/02/15 0200   PRN: acetaminophen **OR** acetaminophen, alum & mag hydroxide-simeth, bisacodyl, diazepam, diphenhydrAMINE, docusate sodium, HYDROmorphone (DILAUDID) injection, magnesium citrate, menthol-cetylpyridinium **OR** phenol, methocarbamol **OR** methocarbamol (ROBAXIN)  IV, metoCLOPramide **OR** metoCLOPramide (REGLAN) injection, ondansetron **OR** ondansetron (ZOFRAN) IV, polyethylene glycol, polyvinyl alcohol, simethicone  Assessment: 80 yo female s/p resection of left TKA with placement of antibiotic spacers. On  chronic warfarin for history of afib.  Home dose 2mg  daily, last dose was to be taken 12/12 in preparation for surgery.  Pharmacy is consulted to resume warfarin dosing post-op and dose vancomycin for infection knee.  Today, 03/03/2015: - INR 1.32 - Hgb down 8.1 but maybe secondary to ABLA, plt down to 242K - No bleeding documented - No significant drug-drug intxns - Ate 80-100% of meals yesterday  Goal of Therapy:  INR 2-3 Monitor platelets by anticoagulation protocol: Yes  Plan: - warfarin 3 mg PO x1 today - Per MD, Lovenox 40mg  SQ q24h until INR >/= 1.8  - daily INR - would resume home dose of 2 mg daily at discharge with INR follow up before weekend   Hershal Coria, PharmD, BCPS Pager:  [phone] 03/03/2015 8:27 AM

## 2015-03-03 NOTE — Progress Notes (Signed)
PHARMACY NOTE -  Ceftriaxone  Pharmacy has been consulted to dose of ceftriaxone for left knee infection. Dosage remains stable at ceftriaxone 2 gr IV q24h and need for further dosage adjustment appears unlikely at present.    Will sign off at this time.  Please reconsult if a change in clinical status warrants re-evaluation of dosage.   Royetta Asal, PharmD, BCPS Pager [phone] 03/03/2015 3:17 PM

## 2015-03-03 NOTE — Progress Notes (Signed)
Physical Therapy Treatment Patient Details Name: [name] MRN: [id number] DOB: 1934-04-03 Today's Date: 03/03/2015    History of Present Illness Pt s/p resection left total knee arthroplasty with placement of antibiotic spacers.  PMHx of RA, back surgery, L TKA 06/2014 and L knee I&D 10/2014, L clavicle fx    PT Comments    Pt performed lateral/scoot transfer and tolerated and performed better then yesterday (stand pivot).  Recommended bari BSC for "bench" surface to perform scoot transfer for pt to use in hospital and requested from portable equipment.  Pt very pleased with her performance and ability to perform transfer today.   Follow Up Recommendations  SNF;Supervision/Assistance - 24 hour     Equipment Recommendations  Wheelchair (measurements PT)    Recommendations for Other Services       Precautions / Restrictions Precautions Precautions: Fall;Knee Precaution Comments: cement spacer Required Braces or Orthoses: Knee Immobilizer - Left Knee Immobilizer - Left: On at all times Restrictions Weight Bearing Restrictions: Yes LLE Weight Bearing: Touchdown weight bearing    Mobility  Bed Mobility Overal bed mobility: Needs Assistance Bed Mobility: Supine to Sit     Supine to sit: Min assist     General bed mobility comments: verbal cues for technique, assist for L LE  Transfers Overall transfer level: Needs assistance Equipment used: None Transfers: Lateral/Scoot Transfers          Lateral/Scoot Transfers: Min assist General transfer comment: pt wishing to be more independent for transfers (concerned about when she will need to transfer for BMs) so educated on scoot transfer which pt tolerated well, required assist for blocking R knee for safety, increased time however pt performed well (bed to drop arm recliner)  Ambulation/Gait                 Stairs            Wheelchair Mobility    Modified Rankin (Stroke Patients Only)        Balance                                    Cognition Arousal/Alertness: Awake/alert Behavior During Therapy: WFL for tasks assessed/performed Overall Cognitive Status: Within Functional Limits for tasks assessed                      Exercises      General Comments        Pertinent Vitals/Pain Pain Assessment: 0-10 Pain Score: 6  Pain Location: L proximial tibia with movement Pain Descriptors / Indicators: Sore Pain Intervention(s): Limited activity within patient's tolerance;Monitored during session;Repositioned    Home Living                      Prior Function            PT Goals (current goals can now be found in the care plan section) Progress towards PT goals: Progressing toward goals    Frequency  7X/week    PT Plan Current plan remains appropriate    Co-evaluation             End of Session Equipment Utilized During Treatment: Left knee immobilizer Activity Tolerance: Patient tolerated treatment well Patient left: in chair;with call bell/phone within reach     Time: 1330-1345 PT Time Calculation (min) (ACUTE ONLY): 15 min  Charges:  $Therapeutic Activity: 8-22 mins  G Codes:      [name],KATHrine E 03-17-15, 3:33 PM Carmelia Bake, PT, DPT 2015/03/17 Pager: [phone]

## 2015-03-03 NOTE — Progress Notes (Signed)
     Subjective: 2 Days Post-Op Procedure(s) (LRB): RESECTION LEFT TOTAL KNEE ARTHROPLASTY WITH PLACEMENT OF  ANTIBIOTIC SPACERS (Left)   Patient reports pain as mild, pain controlled. No events throughout the night. Has had her PICC line placed today and currently getting Vancomycin.  Will be contacting Dr. Derek Mound office today to see if we can get them on board and assist in treatment plan for the patient.  Her plan is to be discharged to SNF ([facility]) eventually when ready.  Possibily tomorrow if everything works out.  Objective:   VITALS:   Filed Vitals:   03/03/15 0422 03/03/15 0800  BP: 100/44 114/55  Pulse: 57 94  Temp: 99.7 F (37.6 C)   Resp: 16     Incision: dressing C/D/I No cellulitis present Compartment soft  LABS  Recent Labs  03/01/15 1314 03/02/15 0420 03/03/15 0425  HGB 11.1* 9.1* 8.1*  HCT 34.9* 27.8* 25.3*  WBC 6.8 12.0* 7.7  PLT 398 281 242     Recent Labs  03/01/15 1314 03/02/15 0420 03/03/15 0425  NA  --  143 143  K  --  3.1* 2.9*  BUN  --  23* 31*  CREATININE 0.94 0.97 1.18*  GLUCOSE  --  116* 96     Assessment/Plan: 2 Days Post-Op Procedure(s) (LRB): RESECTION LEFT TOTAL KNEE ARTHROPLASTY WITH PLACEMENT OF  ANTIBIOTIC SPACERS (Left) Up with therapy Discharge to SNF eventually when ready, possibly Thursday. Will contact Dr. Derek M[place] office for assistance in the patient's treatment Just received her PICC line earlier this morning. Understands her weight restrictions, and is somewhat concerned regarding how she is going to get around.      West Pugh Brelan Hannen   PAC  03/03/2015, 9:48 AM

## 2015-03-04 LAB — CBC
HCT: 25.6 % — ABNORMAL LOW (ref 36.0–46.0)
Hemoglobin: 8.5 g/dL — ABNORMAL LOW (ref 12.0–15.0)
MCH: 29 pg (ref 26.0–34.0)
MCHC: 33.2 g/dL (ref 30.0–36.0)
MCV: 87.4 fL (ref 78.0–100.0)
PLATELETS: 243 10*3/uL (ref 150–400)
RBC: 2.93 MIL/uL — ABNORMAL LOW (ref 3.87–5.11)
RDW: 16.9 % — AB (ref 11.5–15.5)
WBC: 6.5 10*3/uL (ref 4.0–10.5)

## 2015-03-04 LAB — BASIC METABOLIC PANEL
ANION GAP: 10 (ref 5–15)
BUN: 25 mg/dL — AB (ref 6–20)
CALCIUM: 7.9 mg/dL — AB (ref 8.9–10.3)
CO2: 28 mmol/L (ref 22–32)
CREATININE: 1.06 mg/dL — AB (ref 0.44–1.00)
Chloride: 104 mmol/L (ref 101–111)
GFR calc Af Amer: 57 mL/min — ABNORMAL LOW (ref >60)
GFR, EST NON AFRICAN AMERICAN: 49 mL/min — AB (ref >60)
GLUCOSE: 124 mg/dL — AB (ref 65–99)
Potassium: 3.1 mmol/L — ABNORMAL LOW (ref 3.5–5.1)
Sodium: 142 mmol/L (ref 135–145)

## 2015-03-04 LAB — C-REACTIVE PROTEIN: CRP: 10 mg/dL — ABNORMAL HIGH (ref <1.0)

## 2015-03-04 LAB — PROTIME-INR
INR: 1.24 (ref 0.00–1.49)
PROTHROMBIN TIME: 15.8 s — AB (ref 11.6–15.2)

## 2015-03-04 LAB — SEDIMENTATION RATE: SED RATE: 71 mm/h — AB (ref 0–22)

## 2015-03-04 MED ORDER — HEPARIN SOD (PORK) LOCK FLUSH 100 UNIT/ML IV SOLN
250.0000 [IU] | INTRAVENOUS | Status: AC | PRN
  Administered 2015-03-04: 250 [IU]

## 2015-03-04 MED ORDER — ACETAMINOPHEN 325 MG PO TABS: 650.0000 mg | ORAL_TABLET | Freq: Four times a day (QID) | ORAL | Status: DC | PRN

## 2015-03-04 MED ORDER — POTASSIUM CHLORIDE CRYS ER 20 MEQ PO TBCR
40.0000 meq | EXTENDED_RELEASE_TABLET | Freq: Two times a day (BID) | ORAL | Status: DC
  Administered 2015-03-04: 40 meq via ORAL
  Filled 2015-03-04 (×2): qty 2

## 2015-03-04 MED ORDER — VANCOMYCIN HCL IN DEXTROSE 750-5 MG/150ML-% IV SOLN: 750.0000 mg | INTRAVENOUS | Status: DC

## 2015-03-04 MED ORDER — WARFARIN SODIUM 4 MG PO TABS: ORAL_TABLET | ORAL | Status: DC

## 2015-03-04 MED ORDER — OXYCODONE HCL 5 MG PO TABS: 5.0000 mg | ORAL_TABLET | ORAL | Status: DC | PRN

## 2015-03-04 MED ORDER — DIAZEPAM 5 MG PO TABS: 5.0000 mg | ORAL_TABLET | Freq: Four times a day (QID) | ORAL | Status: DC | PRN

## 2015-03-04 MED ORDER — POTASSIUM CHLORIDE CRYS ER 20 MEQ PO TBCR: 40.0000 meq | EXTENDED_RELEASE_TABLET | Freq: Every day | ORAL | Status: DC

## 2015-03-04 MED ORDER — DEXTROSE 5 % IV SOLN: 2.0000 g | INTRAVENOUS | Status: DC

## 2015-03-04 MED ORDER — ENOXAPARIN SODIUM 40 MG/0.4ML ~~LOC~~ SOLN: 40.0000 mg | SUBCUTANEOUS | Status: DC

## 2015-03-04 NOTE — Progress Notes (Signed)
     Subjective: 3 Days Post-Op Procedure(s) (LRB): RESECTION LEFT TOTAL KNEE ARTHROPLASTY WITH PLACEMENT OF  ANTIBIOTIC SPACERS (Left)   Patient reports pain as mild, pain controlled. No events throughout the night. ID made recommendations.  Ready to be discharged to SNF.  Objective:   VITALS:   Filed Vitals:   03/03/15 2213 03/04/15 0437  BP: 118/45 119/55  Pulse: 57 57  Temp:  98.7 F (37.1 C)  Resp:  14    Dorsiflexion/Plantar flexion intact Incision: dressing C/D/I No cellulitis present Compartment soft  LABS  Recent Labs  03/02/15 0420 03/03/15 0425 03/04/15 0850  HGB 9.1* 8.1* 8.5*  HCT 27.8* 25.3* 25.6*  WBC 12.0* 7.7 6.5  PLT 281 242 243     Recent Labs  03/01/15 1314 03/02/15 0420 03/03/15 0425  NA  --  143 143  K  --  3.1* 2.9*  BUN  --  23* 31*  CREATININE 0.94 0.97 1.18*  GLUCOSE  --  116* 96     Assessment/Plan: 3 Days Post-Op Procedure(s) (LRB): RESECTION LEFT TOTAL KNEE ARTHROPLASTY WITH PLACEMENT OF  ANTIBIOTIC SPACERS (Left) Up with therapy Discharge to SNF  Follow up in 2 weeks at [facility]. Follow up with OLIN,Daegen Berrocal D in 2 weeks.  Contact information:  [facility] [address] [phone]    Hypokalemia Treated with oral potassium and will observe       West Pugh. December Hedtke   PAC  03/04/2015, 9:26 AM

## 2015-03-04 NOTE — Progress Notes (Signed)
Physical Therapy Treatment Patient Details Name: [name] MRN: [id number] DOB: Jan 28, 1935 Today's Date: 03/04/2015    History of Present Illness Pt s/p resection left total knee arthroplasty with placement of antibiotic spacers.  PMHx of RA, back surgery, L TKA 06/2014 and L knee I&D 10/2014, L clavicle fx    PT Comments    POD # 2 am session Reinforced KI as pt was in bed with KI on but open and knee was flexed.  Performed some TE's then assisted OOB to [facility] then to recliner.  Pt had difficulty push self up to rise due to B UE weakness.  Pt had difficulty turning/pivoting while maintaining TTWB.   Follow Up Recommendations  SNF ([facility])     Equipment Recommendations       Recommendations for Other Services       Precautions / Restrictions Precautions Precautions: Fall;Knee Precaution Comments: cement spacer Required Braces or Orthoses: Knee Immobilizer - Left Knee Immobilizer - Left: On at all times Restrictions Weight Bearing Restrictions: Yes LLE Weight Bearing: Touchdown weight bearing    Mobility  Bed Mobility Overal bed mobility: Needs Assistance Bed Mobility: Supine to Sit     Supine to sit: Min assist     General bed mobility comments: verbal cues for technique, assist for L LE.  Extra assist to scoot to EOB.    Transfers Overall transfer level: Needs assistance Equipment used: None Transfers: Stand Pivot Transfers Sit to Stand: Total assist         General transfer comment: assisted fro EOB 1/4 turn to [facility] towards her R while blocking Knee and support trunk with partial stance to pivot.  Pt c/o weak B UE's.  Very little effort with upper body.  Then assisted off BSC required + 2 assist for hygiene same fashion.  1/4 pivot turn towards her R partial stance.    Ambulation/Gait             General Gait Details: transfers only   Stairs            Wheelchair Mobility    Modified Rankin (Stroke Patients Only)       Balance                                     Cognition Arousal/Alertness: Awake/alert Behavior During Therapy: WFL for tasks assessed/performed Overall Cognitive Status: Within Functional Limits for tasks assessed                      Exercises      General Comments        Pertinent Vitals/Pain Pain Assessment: Faces Faces Pain Scale: Hurts little more Pain Location: L knee Pain Descriptors / Indicators: Sore Pain Intervention(s): Monitored during session;Repositioned    Home Living                      Prior Function            PT Goals (current goals can now be found in the care plan section) Progress towards PT goals: Progressing toward goals    Frequency  7X/week    PT Plan Current plan remains appropriate    Co-evaluation             End of Session Equipment Utilized During Treatment: Left knee immobilizer;Gait belt Activity Tolerance: Treatment limited secondary to medical complications (Comment) Patient left:  in chair;with call bell/phone within reach     Time: 1035-1100 PT Time Calculation (min) (ACUTE ONLY): 25 min  Charges:  $Therapeutic Exercise: 8-22 mins $Therapeutic Activity: 8-22 mins                    G Codes:      Rica Koyanagi  PTA [facility]  Acute  Rehab Pager      [phone]

## 2015-03-04 NOTE — Clinical Social Work Placement (Signed)
   CLINICAL SOCIAL WORK PLACEMENT  NOTE  Date:  03/04/2015  Patient Details  Name: [name] MRN: [id number] Date of Birth: Dec 11, 1934  Clinical Social Work is seeking post-discharge placement for this patient at the [place] level of care (*CSW will initial, date and re-position this form in  chart as items are completed):  No   Patient/family provided with [place] Work Department's list of facilities offering this level of care within the geographic area requested by the patient (or if unable, by the patient's family).  Yes   Patient/family informed of their freedom to choose among providers that offer the needed level of care, that participate in Medicare, Medicaid or managed care program needed by the patient, have an available bed and are willing to accept the patient.  Yes   Patient/family informed of [place]'s ownership interest in [facility] and [facility], as well as of the fact that they are under no obligation to receive care at these facilities.  PASRR submitted to EDS on       PASRR number received on       Existing PASRR number confirmed on 03/02/15     FL2 transmitted to all facilities in geographic area requested by pt/family on 03/02/15     FL2 transmitted to all facilities within larger geographic area on       Patient informed that his/her managed care company has contracts with or will negotiate with certain facilities, including the following:        Yes   Patient/family informed of bed offers received.  Patient chooses bed at [place] [place]     Physician recommends and patient chooses bed at      Patient to be transferred to [place] on 03/04/15.  Patient to be transferred to facility by [facility]     Patient family notified on 03/04/15 of transfer.  Name of family member notified:  Pt contacted spouse directly.     PHYSICIAN       Additional Comment: Pt is in agreement  with d/c to [facility] today. [facility] transport required. Medical necessity form completed. Pt is aware that out of pocket costs may be associated with [facility] transport. NSG reviewed d/c summary, scripts, avs. D/C summary sent to SNF for review prior to d/c. Scripts included in d/c packet.   _______________________________________________ [name], LCSW  [phone] 03/04/2015, 1:44 PM

## 2015-03-04 NOTE — Care Management Important Message (Signed)
Important Message  Patient Details  Name: [name] MRN: [id number] Date of Birth: 1934-11-28   Medicare Important Message Given:  Yes    Camillo Flaming 03/04/2015, 12:13 [place] Message  Patient Details  Name: [name] MRN: [id number] Date of Birth: 11-23-1934   Medicare Important Message Given:  Yes    Camillo Flaming 03/04/2015, 12:13 PM

## 2015-03-04 NOTE — Discharge Summary (Signed)
Physician Discharge Summary  Patient ID: CAMPBELL DEALE MRN: [id number] DOB/AGE: [birth year] 80 y.o.  Admit date: 03/01/2015 Discharge date:  03/04/2015  Procedures:  Procedure(s) (LRB): RESECTION LEFT TOTAL KNEE ARTHROPLASTY WITH PLACEMENT OF  ANTIBIOTIC SPACERS (Left)  Attending Physician:  Dr. Paralee Cancel   Admission Diagnoses:   Infected left total knee arthroplasty.  Discharge Diagnoses:  Principal Problem:   Left TK resection / spacer  Past Medical History  Diagnosis Date  . Ulcerative colitis ([place])   . Hypothyroidism   . Hypertension     pcp   dr reed   [facility]  . Pneumonia 2008  . Chronic bronchitis ([place]) 10/31/2011    "prone to it; I have sinus/bronchitis problem probably q yr"  . Multiple skin tears 10/31/2011    "get them very easily; my skin is thin and I bruise easily"  . Chronic back pain     "my entire spine"  . Edema   . Other and unspecified hyperlipidemia   . Encounter for long-term (current) use of other medications   . Senile osteoporosis   . Spinal stenosis, unspecified region other than cervical   . Unspecified glaucoma   . Hypothyroidism   . Anxiety     sometimes   . Rheumatoid arthritis(714.0)     degenerative disc disease   . Swelling of right upper extremity 11/26/2014  . Diarrhea 01/27/2015  . Complication of anesthesia 2009    htn with block- for hand surgery   . Dysrhythmia     pt states taking coumadin for paroxysmal atrial fibrillation   . Peripheral vascular disease ([place])   . Depression   . Anemia   . History of blood transfusion   . Hard of hearing   . Dizziness   . Falls   . H/O clavicle fracture     left    HPI:    Pt is a 80 y.o. female complaining of left knee pain and swelling. Lab work and previous findings indicate an infection in the left knee. X-rays in the clinic show previous TKA of the left knee. Pt has previous procedure on the left knee including a TKA on 06/16/2014 and an I&D of the left TKA on  10/13/2014. Various options are discussed with the patient. Risks, benefits and expectations were discussed with the patient. Patient understand the risks, benefits and expectations and wishes to proceed with surgery.   PCP: Hollace Kinnier, DO   Discharged Condition: good  Hospital Course:  Patient underwent the above stated procedure on 03/01/2015. Patient tolerated the procedure well and brought to the recovery room in good condition and subsequently to the floor.  POD #1 BP: 86/37 ; Pulse: 65 ; Temp: 97.8 F (36.6 C) ; Resp: 16 Patient reports pain as mild. Relatively comfortable. No events. Reviewed intra-operative findings and procedure according to findings. Neurovascular intact and incision: dressing C/D/I  LABS  Basename    HGB     9.1  HCT     27.8   POD #2  BP: 114/55 ; Pulse: 94 ; Temp: 99.7 F (37.6 C) ; Resp: 16 Patient reports pain as mild, pain controlled. No events throughout the night. Has had her PICC line placed today and currently getting Vancomycin. Will be contacting Dr. Derek Mound office today to see if we can get them on board and assist in treatment plan for the patient. Her plan is to be discharged to SNF ([facility]) eventually when ready. Possibily tomorrow if everything works  out. Neurovascular intact and incision: dressing C/D/I  LABS  Basename    HGB     8.1  HCT     25.3   POD #3  BP: 119/55 ; Pulse: 57 ; Temp: 98.7 F (37.1 C) ; Resp: 14 Patient reports pain as mild, pain controlled. No events throughout the night. ID made recommendations. Ready to be discharged to SNF. Neurovascular intact and incision: dressing C/D/I  LABS  Basename    HGB     8.5  HCT     25.6    Discharge Exam: General appearance: alert, cooperative and no distress Extremities: Homans sign is negative, no sign of DVT and no edema, redness or tenderness in the calves or thighs  Disposition: Skilled nursing facility with follow up in 2 weeks   Follow-up Information     Follow up with Mauri Pole, MD. Schedule an appointment as soon as possible for a visit in 2 weeks.   Specialty:  Orthopedic Surgery   Contact information:   [address] [place] [address] [phone]       Follow up with Alcide Evener, MD. Schedule an appointment as soon as possible for a visit in 2 weeks.   Specialty:  Infectious Diseases   Contact information:   [address] [place] [address] [phone]       Discharge Instructions    Call MD / Call 911    Complete by:  As directed   If you experience chest pain or shortness of breath, CALL 911 and be transported to the hospital emergency room.  If you develope a fever above 101 F, pus (white drainage) or increased drainage or redness at the wound, or calf pain, call your surgeon's office.     Care order/instruction    Complete by:  As directed   Patient previously received protein shakes / cup while at the facility. Please continue these to assist in nutritional intake for proper healing.     Change dressing    Complete by:  As directed   Maintain surgical dressing until follow up in the clinic. If the edges start to pull up, may reinforce with tape. If the dressing is no longer working, may remove and cover with gauze and tape, but must keep the area dry and clean.  Call with any questions or concerns.     Constipation Prevention    Complete by:  As directed   Drink plenty of fluids.  Prune juice may be helpful.  You may use a stool softener, such as Colace (over the counter) 100 mg twice a day.  Use MiraLax (over the counter) for constipation as needed.     Diet - low sodium heart healthy    Complete by:  As directed      Discharge instructions    Complete by:  As directed   Maintain surgical dressing until follow up in the clinic. If the edges start to pull up, may reinforce with tape. If the dressing is no longer working, may remove and cover with gauze and  tape, but must keep the area dry and clean.  Follow up in 2 weeks at [facility]. Call with any questions or concerns.     Touch down weight bearing    Complete by:  As directed   Laterality:  left  Extremity:  Lower             Medication List  STOP taking these medications        leflunomide 20 MG tablet  Commonly known as:  ARAVA     oxyCODONE-acetaminophen 5-325 MG tablet  Commonly known as:  PERCOCET/ROXICET      TAKE these medications        acetaminophen 325 MG tablet  Commonly known as:  TYLENOL  Take 2 tablets (650 mg total) by mouth every 6 (six) hours as needed for mild pain (or Fever >/= 101).     balsalazide 750 MG capsule  Commonly known as:  COLAZAL  Take 2,250 mg by mouth 2 (two) times daily.     Calcium + D3 600-200 MG-UNIT Tabs  Take 1 tablet by mouth daily with breakfast.     cefTRIAXone 2 g in dextrose 5 % 50 mL  Inject 2 g into the vein daily.     CELEBREX 200 MG capsule  Generic drug:  celecoxib  Take 200 mg by mouth 2 (two) times daily.     diazepam 5 MG tablet  Commonly known as:  VALIUM  Take 1 tablet (5 mg total) by mouth every 6 (six) hours as needed for muscle spasms.     docusate sodium 100 MG capsule  Commonly known as:  COLACE  Take 100 mg by mouth daily as needed for mild constipation.     enoxaparin 40 MG/0.4ML injection  Commonly known as:  LOVENOX  Inject 0.4 mLs (40 mg total) into the skin daily.   Use along with Coumadin to obtain therapeutic INR. May stop Lovenox once INR >/= 1.8.      estradiol 0.5 MG tablet  Commonly known as:  ESTRACE  Take 0.5 mg by mouth at bedtime.     feeding supplement (PRO-STAT SUGAR FREE 64) Liqd  Take 30 mLs by mouth 3 (three) times daily with meals.     ferrous sulfate 325 (65 FE) MG tablet  Take 1 tablet (325 mg total) by mouth daily with breakfast.     folic acid 1 MG tablet  Commonly known as:  FOLVITE  Take 2 mg by mouth daily.     furosemide 40 MG tablet    Commonly known as:  LASIX  Take 1 tablet (40 mg total) by mouth daily.     guaiFENesin 600 MG 12 hr tablet  Commonly known as:  MUCINEX  Take 600 mg by mouth daily as needed (sinus relief).     latanoprost 0.005 % ophthalmic solution  Commonly known as:  XALATAN  Place 1 drop into both eyes at bedtime.     medroxyPROGESTERone 2.5 MG tablet  Commonly known as:  PROVERA  Take 2.5 mg by mouth at bedtime.     metoprolol succinate 25 MG 24 hr tablet  Commonly known as:  TOPROL-XL  Take 1 tablet (25 mg total) by mouth 2 (two) times daily. Take with or immediately following a meal.     multivitamin with minerals Tabs tablet  Take 1 tablet by mouth daily with lunch.     oxyCODONE 5 MG immediate release tablet  Commonly known as:  Oxy IR/ROXICODONE  Take 1-3 tablets (5-15 mg total) by mouth every 4 (four) hours as needed for severe pain.     polyethylene glycol packet  Commonly known as:  MIRALAX / GLYCOLAX  Take 17 g by mouth 2 (two) times daily.     polyvinyl alcohol 1.4 % ophthalmic solution  Commonly known as:  LIQUIFILM TEARS  Place 1 drop into both eyes 2 (  two) times daily as needed for dry eyes.     potassium chloride SA 20 MEQ tablet  Commonly known as:  K-DUR,KLOR-CON  Take 2 tablets (40 mEq total) by mouth daily.     predniSONE 5 MG tablet  Commonly known as:  DELTASONE  Take 5 mg by mouth daily with breakfast.     Simethicone 125 MG Tabs  Take 1 tablet (125 mg total) by mouth every 8 (eight) hours as needed.     SYNTHROID 88 MCG tablet  Generic drug:  levothyroxine  Take 88 mcg by mouth daily before breakfast. DAW, DO NOT SUBSTITUTE WITH GENERIC     Vancomycin 750 MG/150ML Soln  Commonly known as:  VANCOCIN  Inject 150 mLs (750 mg total) into the vein daily.                         Vancomycin level on Friday evening      Vitamin D3 1000 UNITS Caps  Take 1,000 Units by mouth daily.     warfarin 4 MG tablet  Commonly known as:  COUMADIN  Take one-half  tablet (2mg ) daily   Use along with Lovenox to obtain therapeutic INR. Daily INR to monitor levels and dosing adjusted by pharmacy to obtain and maintain therapeutic INR of 2-3. Last dose in the hospital was 2 mg.           Signed: [place] P[place]. Caliber Landess   PA-C  03/04/2015, 9:30 AM

## 2015-03-04 NOTE — Care Management Note (Signed)
Case Management Note  Patient Details  Name: [name] MRN: [id number] Date of Birth: 1934/09/26  Subjective/Objective:     S/p Resection of left total knee arthroplasty, placement of antibiotic spacer               Action/Plan: Discharge planning per CSW  Expected Discharge Date:  03/03/15               Expected Discharge Plan:  Skilled Nursing Facility  In-House Referral:  Clinical Social Work  Discharge planning Services  CM Consult  Post Acute Care Choice:  NA Choice offered to:  NA  DME Arranged:  N/A DME Agency:  NA  HH Arranged:  NA HH Agency:  NA  Status of Service:  Completed, signed off  Medicare Important Message Given:    Date Medicare IM Given:    Medicare IM give by:    Date Additional Medicare IM Given:    Additional Medicare Important Message give by:     If discussed at [place] of Stay Meetings, dates discussed:    Additional Comments:  Guadalupe Maple, RN 03/04/2015, 11:03 AM

## 2015-03-05 LAB — BODY FLUID CULTURE: Culture: NO GROWTH

## 2015-03-07 LAB — ANAEROBIC CULTURE

## 2015-03-26 ENCOUNTER — Encounter (HOSPITAL_COMMUNITY): Payer: Self-pay | Admitting: Emergency Medicine

## 2015-03-26 ENCOUNTER — Emergency Department (HOSPITAL_COMMUNITY)
Admission: EM | Admit: 2015-03-26 | Discharge: 2015-03-26 | Disposition: A | Discharge status: Home / Self Care | Payer: Medicare Other | Attending: Emergency Medicine | Admitting: Emergency Medicine

## 2015-03-26 DIAGNOSIS — Z8719 Personal history of other diseases of the digestive system: Secondary | ICD-10-CM | POA: Insufficient documentation

## 2015-03-26 DIAGNOSIS — L0889 Other specified local infections of the skin and subcutaneous tissue: Secondary | ICD-10-CM | POA: Diagnosis not present

## 2015-03-26 DIAGNOSIS — M069 Rheumatoid arthritis, unspecified: Secondary | ICD-10-CM | POA: Insufficient documentation

## 2015-03-26 DIAGNOSIS — Z8781 Personal history of (healed) traumatic fracture: Secondary | ICD-10-CM | POA: Insufficient documentation

## 2015-03-26 DIAGNOSIS — Z79818 Long term (current) use of other agents affecting estrogen receptors and estrogen levels: Secondary | ICD-10-CM | POA: Diagnosis not present

## 2015-03-26 DIAGNOSIS — F17210 Nicotine dependence, cigarettes, uncomplicated: Secondary | ICD-10-CM | POA: Diagnosis not present

## 2015-03-26 DIAGNOSIS — Z452 Encounter for adjustment and management of vascular access device: Secondary | ICD-10-CM | POA: Diagnosis present

## 2015-03-26 DIAGNOSIS — Z8701 Personal history of pneumonia (recurrent): Secondary | ICD-10-CM | POA: Insufficient documentation

## 2015-03-26 DIAGNOSIS — L089 Local infection of the skin and subcutaneous tissue, unspecified: Secondary | ICD-10-CM

## 2015-03-26 DIAGNOSIS — E039 Hypothyroidism, unspecified: Secondary | ICD-10-CM | POA: Diagnosis not present

## 2015-03-26 DIAGNOSIS — Z87828 Personal history of other (healed) physical injury and trauma: Secondary | ICD-10-CM | POA: Diagnosis not present

## 2015-03-26 DIAGNOSIS — D649 Anemia, unspecified: Secondary | ICD-10-CM | POA: Diagnosis not present

## 2015-03-26 DIAGNOSIS — Z79899 Other long term (current) drug therapy: Secondary | ICD-10-CM | POA: Diagnosis not present

## 2015-03-26 DIAGNOSIS — M7981 Nontraumatic hematoma of soft tissue: Secondary | ICD-10-CM | POA: Diagnosis not present

## 2015-03-26 DIAGNOSIS — Z791 Long term (current) use of non-steroidal anti-inflammatories (NSAID): Secondary | ICD-10-CM | POA: Insufficient documentation

## 2015-03-26 DIAGNOSIS — Z7952 Long term (current) use of systemic steroids: Secondary | ICD-10-CM | POA: Diagnosis not present

## 2015-03-26 DIAGNOSIS — Z7902 Long term (current) use of antithrombotics/antiplatelets: Secondary | ICD-10-CM | POA: Insufficient documentation

## 2015-03-26 DIAGNOSIS — Z8659 Personal history of other mental and behavioral disorders: Secondary | ICD-10-CM | POA: Insufficient documentation

## 2015-03-26 DIAGNOSIS — T148XXA Other injury of unspecified body region, initial encounter: Secondary | ICD-10-CM

## 2015-03-26 DIAGNOSIS — I10 Essential (primary) hypertension: Secondary | ICD-10-CM | POA: Diagnosis not present

## 2015-03-26 DIAGNOSIS — G8929 Other chronic pain: Secondary | ICD-10-CM | POA: Diagnosis not present

## 2015-03-26 DIAGNOSIS — Z792 Long term (current) use of antibiotics: Secondary | ICD-10-CM | POA: Insufficient documentation

## 2015-03-26 LAB — CBC WITH DIFFERENTIAL/PLATELET
Basophils Absolute: 0.1 10*3/uL (ref 0.0–0.1)
Basophils Relative: 1 %
EOS ABS: 0.3 10*3/uL (ref 0.0–0.7)
EOS PCT: 5 %
HCT: 28.4 % — ABNORMAL LOW (ref 36.0–46.0)
HEMOGLOBIN: 8.7 g/dL — AB (ref 12.0–15.0)
LYMPHS ABS: 1.2 10*3/uL (ref 0.7–4.0)
LYMPHS PCT: 21 %
MCH: 29.1 pg (ref 26.0–34.0)
MCHC: 30.6 g/dL (ref 30.0–36.0)
MCV: 95 fL (ref 78.0–100.0)
MONOS PCT: 18 %
Monocytes Absolute: 1 10*3/uL (ref 0.1–1.0)
NEUTROS PCT: 55 %
Neutro Abs: 3 10*3/uL (ref 1.7–7.7)
Platelets: 247 10*3/uL (ref 150–400)
RBC: 2.99 MIL/uL — AB (ref 3.87–5.11)
RDW: 19.3 % — ABNORMAL HIGH (ref 11.5–15.5)
WBC: 5.4 10*3/uL (ref 4.0–10.5)

## 2015-03-26 LAB — I-STAT CHEM 8, ED
BUN: 26 mg/dL — AB (ref 6–20)
CALCIUM ION: 1.07 mmol/L — AB (ref 1.13–1.30)
CHLORIDE: 108 mmol/L (ref 101–111)
Creatinine, Ser: 0.8 mg/dL (ref 0.44–1.00)
GLUCOSE: 82 mg/dL (ref 65–99)
HCT: 29 % — ABNORMAL LOW (ref 36.0–46.0)
Hemoglobin: 9.9 g/dL — ABNORMAL LOW (ref 12.0–15.0)
Potassium: 4.3 mmol/L (ref 3.5–5.1)
Sodium: 142 mmol/L (ref 135–145)
TCO2: 25 mmol/L (ref 0–100)

## 2015-03-26 LAB — PROTIME-INR
INR: 2.84 — ABNORMAL HIGH (ref 0.00–1.49)
Prothrombin Time: 29.3 seconds — ABNORMAL HIGH (ref 11.6–15.2)

## 2015-03-26 MED ORDER — CEPHALEXIN 500 MG PO CAPS: 500.0000 mg | ORAL_CAPSULE | Freq: Four times a day (QID) | ORAL | Status: DC

## 2015-03-26 NOTE — ED Notes (Signed)
Pt in EMS from [place] home, reports LUE redness and swelling. Pt has PICC line LUE. Has been there since 03/01/15 after knee replacement & receiving abx through it. Pt denies pain

## 2015-03-26 NOTE — ED Provider Notes (Signed)
CSN: [id number]     Arrival date & time 03/26/15  0156 History  By signing my name below, I, [name], attest that this documentation has been prepared under the direction and in the presence of Francenia Chimenti, MD . Electronically Signed: [name], Scribe. 03/26/2015. 3:29 AM.    Chief Complaint  Patient presents with  . Vascular Access Problem     Patient is a 81 y.o. female presenting with rash. The history is provided by the patient. No language interpreter was used.  Rash Location:  Shoulder/arm Shoulder/arm rash location:  L forearm Quality: redness   Quality: not draining and not swelling   Severity:  Mild Onset quality:  Gradual Timing:  Constant Progression:  Unchanged Chronicity:  New Context: not animal contact   Relieved by:  Nothing Worsened by:  Nothing tried Ineffective treatments:  None tried Associated symptoms: no fever and no shortness of breath      HPI Comments:  [name] is a 81 y.o. female who presents to the Emergency Department via EMS from [facility], complaining of moderate redness and swelling to her left forearm, first noticed yesterday. She has a PICC line near the site that was placed on 03/01/15 after left knee replacement to receive vancomycin, which she is still receiving. No alleviating factors noted. Pt is currently on coumadin. Also has bruising to knuckles and elbow states she bumped her arm   Past Medical History  Diagnosis Date  . Ulcerative colitis ([place])   . Hypothyroidism   . Hypertension     pcp   dr reed   [facility]  . Pneumonia 2008  . Chronic bronchitis ([place]) 10/31/2011    "prone to it; I have sinus/bronchitis problem probably q yr"  . Multiple skin tears 10/31/2011    "get them very easily; my skin is thin and I bruise easily"  . Chronic back pain     "my entire spine"  . Edema   . Other and unspecified hyperlipidemia   . Encounter for long-term (current) use of other medications   . Senile osteoporosis   .  Spinal stenosis, unspecified region other than cervical   . Unspecified glaucoma   . Hypothyroidism   . Anxiety     sometimes   . Rheumatoid arthritis(714.0)     degenerative disc disease   . Swelling of right upper extremity 11/26/2014  . Diarrhea 01/27/2015  . Complication of anesthesia 2009    htn with block- for hand surgery   . Dysrhythmia     pt states taking coumadin for paroxysmal atrial fibrillation   . Peripheral vascular disease ([place])   . Depression   . Anemia   . History of blood transfusion   . Hard of hearing   . Dizziness   . Falls   . H/O clavicle fracture     left   Past Surgical History  Procedure Laterality Date  . Back surgery    . Sinus surgery with instatrak    . Thyroidectomy, partial  1997  . Posterior fusion lumbar spine  10/31/2011    L2-3; 3-4  . Tonsillectomy      "when I was a small child"  . Functional endoscopic sinus surgery  1988  . Posterior fusion lumbar spine  11/2009    L3-4;  L4-5  . Joint replacement  2009; 2006    joints 2 fingers,rt hand; joint left thumb  . Spine surgery    . Posterior lumbar fusion N/A 08/05/13  T1, T2  . Eye surgery    . Anterior cervical decomp/discectomy fusion N/A 02/17/2014    Procedure: CERVICAL FOUR TO FIVE ANTERIOR CERVICAL DECOMPRESSION/DISCECTOMY FUSION 1 LEVEL;  Surgeon: [name], MD;  Location: [facility];  Service: Neurosurgery;  Laterality: N/A;  C4-5 Anterior cervical decompression/diskectomy/fusion  . Cataract extraction Left 11/11/2013  . Cataract extraction Right 12/30/2013  . Total knee arthroplasty Left 06/16/2014    Procedure: TOTAL KNEE ARTHROPLASTY;  Surgeon: Paralee Cancel, MD;  Location: [facility];  Service: Orthopedics;  Laterality: Left;  . I&d knee with poly exchange Left 10/13/2014    Procedure: INCISION AND DRAINAGE LEFT TOTAL KNEE WITH POLY EXCHANGE;  Surgeon: Paralee Cancel, MD;  Location: [facility];  Service: Orthopedics;  Laterality: Left;  . Excisional total knee arthroplasty with  antibiotic spacers Left 03/01/2015    Procedure: RESECTION LEFT TOTAL KNEE ARTHROPLASTY WITH PLACEMENT OF  ANTIBIOTIC SPACERS;  Surgeon: Paralee Cancel, MD;  Location: [facility];  Service: Orthopedics;  Laterality: Left;   Family History  Problem Relation Age of Onset  . Heart disease Mother   . Cancer Father     lung   Social History  Substance Use Topics  . Smoking status: Current Every Day Smoker -- 0.25 packs/day for 50 years    Types: Cigarettes  . Smokeless tobacco: Never Used     Comment: 10/31/2011  offered smoking cessation materials; pt declines; "I puff; I've never really inhaled"  . Alcohol Use: 2.4 oz/week    2 Glasses of wine, 2 Cans of beer per week     Comment: 10/31/2011 "1 beers/wine with dinner daily ; an occasional scotch"   OB History    No data available     Review of Systems  Constitutional: Negative for fever and chills.  Respiratory: Negative for shortness of breath.   Cardiovascular: Negative for chest pain.  Skin: Positive for color change (erythema) and rash.  All other systems reviewed and are negative.   Allergies  Review of patient's allergies indicates no known allergies.  Home Medications   Prior to Admission medications   Medication Sig Start Date End Date Taking? Authorizing Provider  acetaminophen (TYLENOL) 325 MG tablet Take 2 tablets (650 mg total) by mouth every 6 (six) hours as needed for mild pain (or Fever >/= 101). 03/04/15   Danae Orleans, PA-C  Amino Acids-Protein Hydrolys (FEEDING SUPPLEMENT, PRO-STAT SUGAR FREE 64,) LIQD Take 30 mLs by mouth 3 (three) times daily with meals.     Historical Provider, MD  balsalazide (COLAZAL) 750 MG capsule Take 2,250 mg by mouth 2 (two) times daily.     Historical Provider, MD  Calcium Carb-Cholecalciferol (CALCIUM + D3) 600-200 MG-UNIT TABS Take 1 tablet by mouth daily with breakfast.     Historical Provider, MD  cefTRIAXone 2 g in dextrose 5 % 50 mL Inject 2 g into the vein daily. 03/04/15   Danae Orleans, PA-C  CELEBREX 200 MG capsule Take 200 mg by mouth 2 (two) times daily.  07/15/12   Historical Provider, MD  Cholecalciferol (VITAMIN D3) 1000 UNITS CAPS Take 1,000 Units by mouth daily.    Historical Provider, MD  diazepam (VALIUM) 5 MG tablet Take 1 tablet (5 mg total) by mouth every 6 (six) hours as needed for muscle spasms. 03/04/15   Danae Orleans, PA-C  docusate sodium (COLACE) 100 MG capsule Take 100 mg by mouth daily as needed for mild constipation.    Historical Provider, MD  enoxaparin (LOVENOX) 40 MG/0.4ML injection  Inject 0.4 mLs (40 mg total) into the skin daily. 03/04/15   Danae Orleans, PA-C  estradiol (ESTRACE) 0.5 MG tablet Take 0.5 mg by mouth at bedtime.  06/18/12   Historical Provider, MD  ferrous sulfate 325 (65 FE) MG tablet Take 1 tablet (325 mg total) by mouth daily with breakfast. Patient taking differently: Take 325 mg by mouth daily with lunch.  12/10/14   [name] L [name], DO  folic acid (FOLVITE) 1 MG tablet Take 2 mg by mouth daily.    Historical Provider, MD  furosemide (LASIX) 40 MG tablet Take 1 tablet (40 mg total) by mouth daily. 01/11/15   [name] L [name], DO  guaiFENesin (MUCINEX) 600 MG 12 hr tablet Take 600 mg by mouth daily as needed (sinus relief).     Historical Provider, MD  latanoprost (XALATAN) 0.005 % ophthalmic solution Place 1 drop into both eyes at bedtime.    Historical Provider, MD  levothyroxine (SYNTHROID) 88 MCG tablet Take 88 mcg by mouth daily before breakfast. DAW, DO NOT SUBSTITUTE WITH GENERIC    Historical Provider, MD  medroxyPROGESTERone (PROVERA) 2.5 MG tablet Take 2.5 mg by mouth at bedtime.  06/02/13   Historical Provider, MD  metoprolol succinate (TOPROL-XL) 25 MG 24 hr tablet Take 1 tablet (25 mg total) by mouth 2 (two) times daily. Take with or immediately following a meal. 01/11/15   [name] L [name], DO  Multiple Vitamin (MULTIVITAMIN WITH MINERALS) TABS Take 1 tablet by mouth daily with lunch.     Historical Provider, MD   oxyCODONE (OXY IR/ROXICODONE) 5 MG immediate release tablet Take 1-3 tablets (5-15 mg total) by mouth every 4 (four) hours as needed for severe pain. 03/04/15   Danae Orleans, PA-C  polyethylene glycol (MIRALAX / GLYCOLAX) packet Take 17 g by mouth 2 (two) times daily. Patient taking differently: Take 17 g by mouth 2 (two) times daily as needed (constipation).  10/15/14   Danae Orleans, PA-C  polyvinyl alcohol (LIQUIFILM TEARS) 1.4 % ophthalmic solution Place 1 drop into both eyes 2 (two) times daily as needed for dry eyes.    Historical Provider, MD  potassium chloride SA (K-DUR,KLOR-CON) 20 MEQ tablet Take 2 tablets (40 mEq total) by mouth daily. 03/04/15   Danae Orleans, PA-C  predniSONE (DELTASONE) 5 MG tablet Take 5 mg by mouth daily with breakfast.    Historical Provider, MD  Simethicone 125 MG TABS Take 1 tablet (125 mg total) by mouth every 8 (eight) hours as needed. Patient taking differently: Take 1 tablet by mouth every 8 (eight) hours as needed (flatulence).  01/18/15   [name] L [name], DO  Vancomycin (VANCOCIN) 750 MG/150ML SOLN Inject 150 mLs (750 mg total) into the vein daily. 03/04/15   Danae Orleans, PA-C  warfarin (COUMADIN) 4 MG tablet Take one-half tablet (2mg ) daily 03/04/15   Danae Orleans, PA-C   BP 132/56 mmHg  Pulse 51  Temp(Src) 98.2 F (36.8 C) (Oral)  Resp 16  SpO2 100% Physical Exam  Constitutional: She is oriented to person, place, and time. She appears well-developed and well-nourished. No distress.  HENT:  Head: Normocephalic and atraumatic.  Mouth/Throat: Oropharynx is clear and moist. No oropharyngeal exudate.  Moist mucous membranes   Eyes: Conjunctivae are normal. Pupils are equal, round, and reactive to light.  Neck: No JVD present.  Trachea midline  Cardiovascular: Normal rate, regular rhythm and normal heart sounds.   Pulses:      Radial pulses are 2+ on the left side.  Pulmonary/Chest:  Effort normal and breath sounds normal. No respiratory  distress.  Abdominal: Soft. Bowel sounds are normal. She exhibits no distension.  Neurological: She is alert and oriented to person, place, and time. She has normal reflexes.  Skin: Skin is warm and dry.  Mild bruising noted to knuckles of left hand and left wrist   Psychiatric: She has a normal mood and affect. Her behavior is normal.  Nursing note and vitals reviewed.   ED Course  Procedures   DIAGNOSTIC STUDIES:  Oxygen Saturation is 100% on RA, normal by my interpretation.    COORDINATION OF CARE:  3:17 AM Discussed treatment plan with pt at bedside and pt agreed to plan.  Labs Review Labs Reviewed - No data to display  Imaging Review No results found. I have personally reviewed and evaluated these lab results as part of my medical decision-making.   EKG Interpretation None      MDM   Final diagnoses:  None    PICC line is CDI will start keflex QID for added coverage follow up with your PMD for ongoing care  I personally performed the services described in this documentation, which was scribed in my presence. The recorded information has been reviewed and is accurate.       Ravyn Nikkel, MD 03/26/15 0600

## 2015-03-26 NOTE — ED Notes (Signed)
[facility] CALLED @ [id number].

## 2015-03-26 NOTE — ED Notes (Signed)
[facility] has been called

## 2015-03-29 ENCOUNTER — Ambulatory Visit (INDEPENDENT_AMBULATORY_CARE_PROVIDER_SITE_OTHER): Payer: Medicare Other | Admitting: Infectious Disease

## 2015-03-29 ENCOUNTER — Encounter: Payer: Self-pay | Admitting: Infectious Disease

## 2015-03-29 VITALS — BP 134/72 | HR 75 | Temp 98.2°F

## 2015-03-29 DIAGNOSIS — T8454XA Infection and inflammatory reaction due to internal left knee prosthesis, initial encounter: Secondary | ICD-10-CM

## 2015-03-29 DIAGNOSIS — I480 Paroxysmal atrial fibrillation: Secondary | ICD-10-CM

## 2015-03-29 DIAGNOSIS — M05711 Rheumatoid arthritis with rheumatoid factor of right shoulder without organ or systems involvement: Secondary | ICD-10-CM | POA: Diagnosis not present

## 2015-03-29 DIAGNOSIS — M7989 Other specified soft tissue disorders: Secondary | ICD-10-CM

## 2015-03-29 DIAGNOSIS — K5190 Ulcerative colitis, unspecified, without complications: Secondary | ICD-10-CM | POA: Diagnosis not present

## 2015-03-29 DIAGNOSIS — I8311 Varicose veins of right lower extremity with inflammation: Secondary | ICD-10-CM

## 2015-03-29 DIAGNOSIS — Z7901 Long term (current) use of anticoagulants: Secondary | ICD-10-CM

## 2015-03-29 DIAGNOSIS — I8312 Varicose veins of left lower extremity with inflammation: Secondary | ICD-10-CM

## 2015-03-29 DIAGNOSIS — Z96652 Presence of left artificial knee joint: Secondary | ICD-10-CM

## 2015-03-29 DIAGNOSIS — I872 Venous insufficiency (chronic) (peripheral): Secondary | ICD-10-CM

## 2015-03-29 DIAGNOSIS — M05712 Rheumatoid arthritis with rheumatoid factor of left shoulder without organ or systems involvement: Secondary | ICD-10-CM

## 2015-03-29 MED ORDER — DOXYCYCLINE HYCLATE 100 MG PO TABS: 100.0000 mg | ORAL_TABLET | Freq: Two times a day (BID) | ORAL | Status: DC

## 2015-03-29 NOTE — Patient Instructions (Addendum)
I would IMMEDIATELY STOP Eudora (it is redundant with antibiotics you are already receiving)  I would recommend repeat Doppler of Left UE to exclude blood clot  We will plan on finishing IV antibiotics on January 29th and then PICC can be pulled  After IV abx are completed you should start oral doxycyline at 100mg  twice daily on January 30th and continue this for several months  Fu in [place] clinic at the end of February

## 2015-03-29 NOTE — Progress Notes (Signed)
Chief complaint: Redness on her left forearm along with edema  Subjective:    Patient ID: [name], female    DOB: [birth year], 81 y.o.   MRN: [id number]  HPI   81 y.o. female with hx of RA, UC, hypothyroidism, on humara (on hold), pred 5 mg daily, and celecoxib who underwent left TKA in early April 2016. Post operative did well initially with exception to repeated bouts of lowe extremity ulcers and cellulitis to left leg. She had cellulitis treated with a course of bactrim in June as an out patient, followed by having to be admitted in mid July for worsening cellulitis, treated initially with IV clindamycin then transitioned to orals for a total of 10 days. She suffered discomfort with swallowing associated with oral clindamycin (possible pill esophagitis) She had developed worsening left knee pain and swelling.she was seen in dr. Aurea Graff office initially for evaluation of her knee. She underwent arthrocentesis which revealed 24,000 WBC. due to recent bouts of cellulitis from a leg wound, there is the thought that she may have secondarily infected left total knee arthroplasty. she underwent an I&D and poly exchange with intent to treat as an early PJI with 6 weeks of IV antibiotics followed by oral antibiotic to finish 6 month course of treatment. Cultures from her knee joint never yielded an organisms and she has remained on IV vancomycin with significant improvement in her knee pain. After she finished her IV vancomycin and we laced her on oral doxycycline and cephalexin. While on these antibiotics she developed worsening diarrhea and there was concern for C. difficile colitis. She tested negative for C. difficile colitis after stopping her cephalexin her diarrhea improved. Her inflammatory markers showed an encouraging sedimentation rate but her C-reactive protein was above 200. She saw Dr. Alvan Dame who aspirated her joint indeterminate she had recurrent infection. We did  not have access to  the cell count differential cultures or notes from Dr. Aurea Graff office at that time.   She was  scheduled for removal of her prosthesis and all of its components in December on December 19 and we had her stop her doxycycline at my last visit with her and she stayed true to our plan and stayed off the doxycycline up through surgery. Cultures in the operating room unfortunately did not yield an organism. She did undergo orthopedic surgery on December 19 with  Resection of left total knee arthroplasty, placement of antibiotic spacer. Caveat was that the femoral component, polyethylene insert, and patella were removed. The tibial component was unable to be removed safely, thus was retained. Cement spacer included the use of 2 g of vancomycin and 2.4 g of tobramycin.  She was seen by my partner Dr. Linus Salmons was placed on IV vancomycin and ceftriaxone has been on these for the last 28+ days. Her knee pain has improved but she dislikes the brace that she has to wear. She has developed some erythema and edema in her left forearm. She underwent a Doppler that excluded a deep venous thrombosis in this arm but I wonder if it needs to be repeated since there is an unusual amount of edema in her left arm compared to her right. She also has some significant left elbow pain but she claims that this is chronic in nature and not acute.  Past Medical History  Diagnosis Date  . Ulcerative colitis ([place])   . Hypothyroidism   . Hypertension     pcp   dr reed   [facility]  [facility]  . Pneumonia 2008  . Chronic bronchitis ([place]) 10/31/2011    "prone to it; I have sinus/bronchitis problem probably q yr"  . Multiple skin tears 10/31/2011    "get them very easily; my skin is thin and I bruise easily"  . Chronic back pain     "my entire spine"  . Edema   . Other and unspecified hyperlipidemia   . Encounter for long-term (current) use of other medications   . Senile osteoporosis   . Spinal stenosis, unspecified region other  than cervical   . Unspecified glaucoma   . Hypothyroidism   . Anxiety     sometimes   . Rheumatoid arthritis(714.0)     degenerative disc disease   . Swelling of right upper extremity 11/26/2014  . Diarrhea 01/27/2015  . Complication of anesthesia 2009    htn with block- for hand surgery   . Dysrhythmia     pt states taking coumadin for paroxysmal atrial fibrillation   . Peripheral vascular disease ([place])   . Depression   . Anemia   . History of blood transfusion   . Hard of hearing   . Dizziness   . Falls   . H/O clavicle fracture     left   Past Surgical History  Procedure Laterality Date  . Back surgery    . Sinus surgery with instatrak    . Thyroidectomy, partial  1997  . Posterior fusion lumbar spine  10/31/2011    L2-3; 3-4  . Tonsillectomy      "when I was a small child"  . Functional endoscopic sinus surgery  1988  . Posterior fusion lumbar spine  11/2009    L3-4;  L4-5  . Joint replacement  2009; 2006    joints 2 fingers,rt hand; joint left thumb  . Spine surgery    . Posterior lumbar fusion N/A 08/05/13    T1, T2  . Eye surgery    . Anterior cervical decomp/discectomy fusion N/A 02/17/2014    Procedure: CERVICAL FOUR TO FIVE ANTERIOR CERVICAL DECOMPRESSION/DISCECTOMY FUSION 1 LEVEL;  Surgeon: [name], MD;  Location: [facility];  Service: Neurosurgery;  Laterality: N/A;  C4-5 Anterior cervical decompression/diskectomy/fusion  . Cataract extraction Left 11/11/2013  . Cataract extraction Right 12/30/2013  . Total knee arthroplasty Left 06/16/2014    Procedure: TOTAL KNEE ARTHROPLASTY;  Surgeon: Paralee Cancel, MD;  Location: [facility];  Service: Orthopedics;  Laterality: Left;  . I&d knee with poly exchange Left 10/13/2014    Procedure: INCISION AND DRAINAGE LEFT TOTAL KNEE WITH POLY EXCHANGE;  Surgeon: Paralee Cancel, MD;  Location: [facility];  Service: Orthopedics;  Laterality: Left;  . Excisional total knee arthroplasty with antibiotic spacers Left 03/01/2015     Procedure: RESECTION LEFT TOTAL KNEE ARTHROPLASTY WITH PLACEMENT OF  ANTIBIOTIC SPACERS;  Surgeon: Paralee Cancel, MD;  Location: [facility];  Service: Orthopedics;  Laterality: Left;   Family History  Problem Relation Age of Onset  . Heart disease Mother   . Cancer Father     lung   Social History  Substance Use Topics  . Smoking status: Former Smoker -- 0.25 packs/day for 50 years    Types: Cigarettes    Quit date: 02/20/2015  . Smokeless tobacco: Never Used     Comment: 10/31/2011  offered smoking cessation materials; pt declines; "I puff; I've never really inhaled"  . Alcohol Use: No     Comment: 10/31/2011 "1 beers/wine with dinner daily ; an occasional scotch"  No Known Allergies   Current outpatient prescriptions:  .  acetaminophen (TYLENOL) 325 MG tablet, Take 2 tablets (650 mg total) by mouth every 6 (six) hours as needed for mild pain (or Fever >/= 101)., Disp: , Rfl:  .  Amino Acids-Protein Hydrolys (FEEDING SUPPLEMENT, PRO-STAT SUGAR FREE 64,) LIQD, Take 30 mLs by mouth 3 (three) times daily with meals. , Disp: , Rfl:  .  balsalazide (COLAZAL) 750 MG capsule, Take 2,250 mg by mouth 2 (two) times daily. , Disp: , Rfl:  .  Calcium Carb-Cholecalciferol (CALCIUM + D3) 600-200 MG-UNIT TABS, Take 1 tablet by mouth daily with breakfast. , Disp: , Rfl:  .  cefTRIAXone 2 g in dextrose 5 % 50 mL, Inject 2 g into the vein daily., Disp: 42 packet, Rfl: 0 .  CELEBREX 200 MG capsule, Take 200 mg by mouth 2 (two) times daily. , Disp: , Rfl:  .  Cholecalciferol (VITAMIN D3) 1000 UNITS CAPS, Take 1,000 Units by mouth daily., Disp: , Rfl:  .  diazepam (VALIUM) 5 MG tablet, Take 1 tablet (5 mg total) by mouth every 6 (six) hours as needed for muscle spasms., Disp: 30 tablet, Rfl: 0 .  docusate sodium (COLACE) 100 MG capsule, Take 100 mg by mouth daily as needed for mild constipation., Disp: , Rfl:  .  enoxaparin (LOVENOX) 40 MG/0.4ML injection, Inject 0.4 mLs (40 mg total) into the skin daily.,  Disp: 12 Syringe, Rfl: 0 .  estradiol (ESTRACE) 0.5 MG tablet, Take 0.5 mg by mouth at bedtime. , Disp: , Rfl:  .  ferrous sulfate 325 (65 FE) MG tablet, Take 1 tablet (325 mg total) by mouth daily with breakfast. (Patient taking differently: Take 325 mg by mouth daily with lunch. ), Disp: , Rfl: 3 .  folic acid (FOLVITE) 1 MG tablet, Take 2 mg by mouth daily., Disp: , Rfl:  .  furosemide (LASIX) 40 MG tablet, Take 1 tablet (40 mg total) by mouth daily., Disp: 30 tablet, Rfl: 3 .  guaiFENesin (MUCINEX) 600 MG 12 hr tablet, Take 600 mg by mouth daily as needed (sinus relief). , Disp: , Rfl:  .  latanoprost (XALATAN) 0.005 % ophthalmic solution, Place 1 drop into both eyes at bedtime., Disp: , Rfl:  .  levothyroxine (SYNTHROID) 88 MCG tablet, Take 88 mcg by mouth daily before breakfast. DAW, DO NOT SUBSTITUTE WITH GENERIC, Disp: , Rfl:  .  medroxyPROGESTERone (PROVERA) 2.5 MG tablet, Take 2.5 mg by mouth at bedtime. , Disp: , Rfl:  .  metoprolol succinate (TOPROL-XL) 25 MG 24 hr tablet, Take 1 tablet (25 mg total) by mouth 2 (two) times daily. Take with or immediately following a meal., Disp: 60 tablet, Rfl: 3 .  Multiple Vitamin (MULTIVITAMIN WITH MINERALS) TABS, Take 1 tablet by mouth daily with lunch. , Disp: , Rfl:  .  oxyCODONE (OXY IR/ROXICODONE) 5 MG immediate release tablet, Take 1-3 tablets (5-15 mg total) by mouth every 4 (four) hours as needed for severe pain., Disp: 120 tablet, Rfl: 0 .  polyethylene glycol (MIRALAX / GLYCOLAX) packet, Take 17 g by mouth 2 (two) times daily. (Patient taking differently: Take 17 g by mouth 2 (two) times daily as needed (constipation). ), Disp: 14 each, Rfl: 0 .  polyvinyl alcohol (LIQUIFILM TEARS) 1.4 % ophthalmic solution, Place 1 drop into both eyes 2 (two) times daily as needed for dry eyes., Disp: , Rfl:  .  potassium chloride SA (K-DUR,KLOR-CON) 20 MEQ tablet, Take 2 tablets (40 mEq total)  by mouth daily., Disp: 30 tablet, Rfl: 3 .  predniSONE  (DELTASONE) 5 MG tablet, Take 5 mg by mouth daily with breakfast., Disp: , Rfl:  .  Simethicone 125 MG TABS, Take 1 tablet (125 mg total) by mouth every 8 (eight) hours as needed. (Patient taking differently: Take 1 tablet by mouth every 8 (eight) hours as needed (flatulence). ), Disp: 90 tablet, Rfl: 3 .  Vancomycin (VANCOCIN) 750 MG/150ML SOLN, Inject 150 mLs (750 mg total) into the vein daily., Disp: 6300 mL, Rfl: 0 .  warfarin (COUMADIN) 4 MG tablet, Take one-half tablet (2mg ) daily, Disp: 30 tablet, Rfl: 3 .  doxycycline (VIBRA-TABS) 100 MG tablet, Take 1 tablet (100 mg total) by mouth 2 (two) times daily., Disp: 60 tablet, Rfl: 11    Review of Systems  Constitutional: Negative for fever, chills, diaphoresis, activity change, appetite change, fatigue and unexpected weight change.  HENT: Negative for congestion, rhinorrhea, sinus pressure, sneezing, sore throat and trouble swallowing.   Eyes: Negative for photophobia and visual disturbance.  Respiratory: Negative for cough, chest tightness, shortness of breath, wheezing and stridor.   Cardiovascular: Negative for chest pain, palpitations and leg swelling.  Gastrointestinal: Negative for nausea, vomiting, abdominal pain, diarrhea, constipation, blood in stool, abdominal distention and anal bleeding.  Genitourinary: Negative for dysuria, hematuria, flank pain and difficulty urinating.  Musculoskeletal: Positive for myalgias and joint swelling. Negative for back pain, arthralgias and gait problem.  Skin: Positive for color change and wound. Negative for pallor and rash.  Neurological: Negative for dizziness, tremors, weakness and light-headedness.  Hematological: Negative for adenopathy. Does not bruise/bleed easily.  Psychiatric/Behavioral: Negative for behavioral problems, confusion, sleep disturbance, dysphoric mood, decreased concentration and agitation.       Objective:   Physical Exam  Constitutional: She is oriented to person,  place, and time. No distress.  HENT:  Head: Normocephalic and atraumatic.  Mouth/Throat: No oropharyngeal exudate.  Eyes: Conjunctivae and EOM are normal. No scleral icterus.  Neck: Normal range of motion. Neck supple.  Cardiovascular: Normal rate and regular rhythm.   Pulmonary/Chest: Effort normal. No respiratory distress. She has no wheezes.  Abdominal: She exhibits no distension.  Musculoskeletal: She exhibits no edema or tenderness.  Neurological: She is alert and oriented to person, place, and time. She exhibits normal muscle tone. Coordination normal.  Skin: Skin is warm and dry. She is not diaphoretic. There is erythema.  Psychiatric: She has a normal mood and affect. Her behavior is normal. Judgment and thought content normal.    Left arm with erythema and edema. Left elbow is also quite tender 03/29/15:    Left knee with surgical scar that is well-healed 03/29/2015:          Assessment & Plan:   Prosthetic joint infection sp I&D and exchange sp IV vancomycin to oral doxy and keflex  with recurrence on therapy. We took her off the doxycycline to ensure better coverage organism in the operating room. She underwent removal of all the prosthetic material other than the tibial component on December 19 but cultures did not yield an organism. She's currently on vancomycin and ceftriaxone.  I would have her finish up her 6 week course of vancomycin and ceftriaxone on 04/11/2015. I then placed on oral doxycycline for multiple months thereafter potentially 6 months and follow her closely.  Erythema in the forearm with edema also with left elbow pain: She was placed on Keflex I believe for the idea that she might have cellulitis. Note Keflex does  not add any coverage to her vancomycin and ceftriaxone I don't think she has cellulitis in this arm. I would stop the keflex   I do worry still there could be a DVT that was not picked up on the initial Doppler and I would consider doing a  repeat Doppler study.   It is also worth thinking about is whether the elbow joint itself might be affected she claims to have chronic elbow pain and bone-on-bone changes there but if this persists I would get imaging of the left elbow likely an MRI with contrast if possible  Atrial fibrillation with RVR: On Coumadin and beta blocker  RA and UC: hold anti-TNF therapy while recovering from her infection over next half year  Diarrhea: Resolved off Keflex. I've counseled her not to use probiotics given this is no evidence that probiotics have not been found to be effective at reducing incidence of antibiotic-induced diarrhea or  C difficile colitis and in fact pose a risk for disseminated fungemia or bacteremia.  Lower extremity edema: Likely due to her recent heart failure due to atrial fibrillation defer to primary care would be careful though with diuretics given the fact she is on vancomycin currently  I spent greater than 40 minutes with the patient including greater than 50% of time in face to face counsel of the patient of her PJI, lRA and UC, erythema in arm, lower extremity edema, diarrhea and in coordination of her care.

## 2015-03-30 ENCOUNTER — Ambulatory Visit: Payer: Medicare Other | Admitting: Podiatry

## 2015-04-20 ENCOUNTER — Ambulatory Visit (INDEPENDENT_AMBULATORY_CARE_PROVIDER_SITE_OTHER): Payer: Medicare Other | Admitting: Infectious Disease

## 2015-04-20 ENCOUNTER — Encounter: Payer: Self-pay | Admitting: Infectious Disease

## 2015-04-20 VITALS — Ht 67.5 in | Wt 135.0 lb

## 2015-04-20 DIAGNOSIS — M05711 Rheumatoid arthritis with rheumatoid factor of right shoulder without organ or systems involvement: Secondary | ICD-10-CM | POA: Diagnosis not present

## 2015-04-20 DIAGNOSIS — Z899 Acquired absence of limb, unspecified: Secondary | ICD-10-CM | POA: Diagnosis not present

## 2015-04-20 DIAGNOSIS — K5190 Ulcerative colitis, unspecified, without complications: Secondary | ICD-10-CM

## 2015-04-20 DIAGNOSIS — I480 Paroxysmal atrial fibrillation: Secondary | ICD-10-CM

## 2015-04-20 DIAGNOSIS — T8454XA Infection and inflammatory reaction due to internal left knee prosthesis, initial encounter: Secondary | ICD-10-CM | POA: Diagnosis not present

## 2015-04-20 DIAGNOSIS — Z96652 Presence of left artificial knee joint: Secondary | ICD-10-CM

## 2015-04-20 DIAGNOSIS — R197 Diarrhea, unspecified: Secondary | ICD-10-CM

## 2015-04-20 DIAGNOSIS — I809 Phlebitis and thrombophlebitis of unspecified site: Secondary | ICD-10-CM | POA: Insufficient documentation

## 2015-04-20 DIAGNOSIS — Z89529 Acquired absence of unspecified knee: Secondary | ICD-10-CM

## 2015-04-20 HISTORY — DX: Phlebitis and thrombophlebitis of unspecified site: I80.9

## 2015-04-20 LAB — CBC WITH DIFFERENTIAL/PLATELET
BASOS PCT: 1 % (ref 0–1)
Basophils Absolute: 0.1 10*3/uL (ref 0.0–0.1)
EOS ABS: 0 10*3/uL (ref 0.0–0.7)
Eosinophils Relative: 0 % (ref 0–5)
HCT: 33.6 % — ABNORMAL LOW (ref 36.0–46.0)
Hemoglobin: 10.3 g/dL — ABNORMAL LOW (ref 12.0–15.0)
Lymphocytes Relative: 17 % (ref 12–46)
Lymphs Abs: 1 10*3/uL (ref 0.7–4.0)
MCH: 28.7 pg (ref 26.0–34.0)
MCHC: 30.7 g/dL (ref 30.0–36.0)
MCV: 93.6 fL (ref 78.0–100.0)
MONO ABS: 0.3 10*3/uL (ref 0.1–1.0)
MONOS PCT: 5 % (ref 3–12)
MPV: 10 fL (ref 8.6–12.4)
NEUTROS ABS: 4.7 10*3/uL (ref 1.7–7.7)
NEUTROS PCT: 77 % (ref 43–77)
PLATELETS: 375 10*3/uL (ref 150–400)
RBC: 3.59 MIL/uL — AB (ref 3.87–5.11)
RDW: 16 % — ABNORMAL HIGH (ref 11.5–15.5)
WBC: 6.1 10*3/uL (ref 4.0–10.5)

## 2015-04-20 LAB — BASIC METABOLIC PANEL WITH GFR
BUN: 40 mg/dL — ABNORMAL HIGH (ref 7–25)
CALCIUM: 9.3 mg/dL (ref 8.6–10.4)
CO2: 28 mmol/L (ref 20–31)
Chloride: 104 mmol/L (ref 98–110)
Creat: 1.42 mg/dL — ABNORMAL HIGH (ref 0.60–0.93)
GFR, EST AFRICAN AMERICAN: 41 mL/min — AB (ref >=60)
GFR, EST NON AFRICAN AMERICAN: 35 mL/min — AB (ref >=60)
GLUCOSE: 101 mg/dL — AB (ref 65–99)
POTASSIUM: 5.7 mmol/L — AB (ref 3.5–5.3)
Sodium: 143 mmol/L (ref 135–146)

## 2015-04-20 LAB — C-REACTIVE PROTEIN: CRP: 0.5 mg/dL (ref <0.60)

## 2015-04-20 NOTE — Progress Notes (Signed)
Chief complaint: Redness on her left forearm along with edema  Subjective:    Patient ID: [name], female    DOB: 11/12/1934, 81 y.o.   MRN: [id number]  HPI   81 y.o. female with hx of RA, UC, hypothyroidism, on humara (on hold), pred 5 mg daily, and celecoxib who underwent left TKA in early April 2016. Post operative did well initially with exception to repeated bouts of lowe extremity ulcers and cellulitis to left leg. She had cellulitis treated with a course of bactrim in June as an out patient, followed by having to be admitted in mid July for worsening cellulitis, treated initially with IV clindamycin then transitioned to orals for a total of 10 days. She suffered discomfort with swallowing associated with oral clindamycin (possible pill esophagitis) She had developed worsening left knee pain and swelling.she was seen in dr. Aurea Graff office initially for evaluation of her knee. She underwent arthrocentesis which revealed 24,000 WBC. due to recent bouts of cellulitis from a leg wound, there is the thought that she may have secondarily infected left total knee arthroplasty. she underwent an I&D and poly exchange with intent to treat as an early PJI with 6 weeks of IV antibiotics followed by oral antibiotic to finish 6 month course of treatment. Cultures from her knee joint never yielded an organisms and she has remained on IV vancomycin with significant improvement in her knee pain. After she finished her IV vancomycin and we laced her on oral doxycycline and cephalexin. While on these antibiotics she developed worsening diarrhea and there was concern for C. difficile colitis. She tested negative for C. difficile colitis after stopping her cephalexin her diarrhea improved. Her inflammatory markers showed an encouraging sedimentation rate but her C-reactive protein was above 200. She saw Dr. Alvan Dame who aspirated her joint indeterminate she had recurrent infection. We did  not have access to  the cell count differential cultures or notes from Dr. Aurea Graff office at that time.   She was  scheduled for removal of her prosthesis and all of its components in December on December 19 and we had her stop her doxycycline at my last visit with her and she stayed true to our plan and stayed off the doxycycline up through surgery. Cultures in the operating room unfortunately did not yield an organism. She did undergo orthopedic surgery on December 19 with  Resection of left total knee arthroplasty, placement of antibiotic spacer. Caveat was that the femoral component, polyethylene insert, and patella were removed. The tibial component was unable to be removed safely, thus was retained. Cement spacer included the use of 2 g of vancomycin and 2.4 g of tobramycin.  She was seen by my partner Dr. Linus Salmons was placed on IV vancomycin and ceftriaxone has been on these for the last 28+ days. Her knee pain has improved but she disliked the brace that she has to wear. She has developed some erythema and edema in her left forearm. She underwent a Doppler that excluded a deep venous thrombosis in this arm but I wonder if it needs to be repeated since there is an unusual amount of edema in her left arm compared to her right.   She is here for followup with [place] and stopped the Vancomycin last month but PICC remained. She has area of induration c/w clot near PICC after my RN removed it.  She continues to have pain in her knee even at rest though better than when I saw her last. She  has had persistent weeping of fluids through her skin bilaterally below the knees and these are wrapped.  Past Medical History  Diagnosis Date  . Ulcerative colitis ([place])   . Hypothyroidism   . Hypertension     pcp   dr reed   [facility]  . Pneumonia 2008  . Chronic bronchitis ([place]) 10/31/2011    "prone to it; I have sinus/bronchitis problem probably q yr"  . Multiple skin tears 10/31/2011    "get them very easily; my skin is  thin and I bruise easily"  . Chronic back pain     "my entire spine"  . Edema   . Other and unspecified hyperlipidemia   . Encounter for long-term (current) use of other medications   . Senile osteoporosis   . Spinal stenosis, unspecified region other than cervical   . Unspecified glaucoma   . Hypothyroidism   . Anxiety     sometimes   . Rheumatoid arthritis(714.0)     degenerative disc disease   . Swelling of right upper extremity 11/26/2014  . Diarrhea 01/27/2015  . Complication of anesthesia 2009    htn with block- for hand surgery   . Dysrhythmia     pt states taking coumadin for paroxysmal atrial fibrillation   . Peripheral vascular disease ([place])   . Depression   . Anemia   . History of blood transfusion   . Hard of hearing   . Dizziness   . Falls   . H/O clavicle fracture     left   Past Surgical History  Procedure Laterality Date  . Back surgery    . Sinus surgery with instatrak    . Thyroidectomy, partial  1997  . Posterior fusion lumbar spine  10/31/2011    L2-3; 3-4  . Tonsillectomy      "when I was a small child"  . Functional endoscopic sinus surgery  1988  . Posterior fusion lumbar spine  11/2009    L3-4;  L4-5  . Joint replacement  2009; 2006    joints 2 fingers,rt hand; joint left thumb  . Spine surgery    . Posterior lumbar fusion N/A 08/05/13    T1, T2  . Eye surgery    . Anterior cervical decomp/discectomy fusion N/A 02/17/2014    Procedure: CERVICAL FOUR TO FIVE ANTERIOR CERVICAL DECOMPRESSION/DISCECTOMY FUSION 1 LEVEL;  Surgeon: [name], MD;  Location: [facility];  Service: Neurosurgery;  Laterality: N/A;  C4-5 Anterior cervical decompression/diskectomy/fusion  . Cataract extraction Left 11/11/2013  . Cataract extraction Right 12/30/2013  . Total knee arthroplasty Left 06/16/2014    Procedure: TOTAL KNEE ARTHROPLASTY;  Surgeon: Paralee Cancel, MD;  Location: [facility];  Service: Orthopedics;  Laterality: Left;  . I&d knee with poly exchange Left  10/13/2014    Procedure: INCISION AND DRAINAGE LEFT TOTAL KNEE WITH POLY EXCHANGE;  Surgeon: Paralee Cancel, MD;  Location: [facility];  Service: Orthopedics;  Laterality: Left;  . Excisional total knee arthroplasty with antibiotic spacers Left 03/01/2015    Procedure: RESECTION LEFT TOTAL KNEE ARTHROPLASTY WITH PLACEMENT OF  ANTIBIOTIC SPACERS;  Surgeon: Paralee Cancel, MD;  Location: [facility];  Service: Orthopedics;  Laterality: Left;   Family History  Problem Relation Age of Onset  . Heart disease Mother   . Cancer Father     lung   Social History  Substance Use Topics  . Smoking status: Former Smoker -- 0.25 packs/day for 50 years    Types: Cigarettes  Quit date: 02/20/2015  . Smokeless tobacco: Never Used     Comment: 10/31/2011  offered smoking cessation materials; pt declines; "I puff; I've never really inhaled"  . Alcohol Use: No     Comment: 10/31/2011 "1 beers/wine with dinner daily ; an occasional scotch"   No Known Allergies   Current outpatient prescriptions:  .  acetaminophen (TYLENOL) 325 MG tablet, Take 2 tablets (650 mg total) by mouth every 6 (six) hours as needed for mild pain (or Fever >/= 101)., Disp: , Rfl:  .  Amino Acids-Protein Hydrolys (FEEDING SUPPLEMENT, PRO-STAT SUGAR FREE 64,) LIQD, Take 30 mLs by mouth 3 (three) times daily with meals. , Disp: , Rfl:  .  balsalazide (COLAZAL) 750 MG capsule, Take 2,250 mg by mouth 2 (two) times daily. , Disp: , Rfl:  .  Calcium Carb-Cholecalciferol (CALCIUM + D3) 600-200 MG-UNIT TABS, Take 1 tablet by mouth daily with breakfast. , Disp: , Rfl:  .  cefTRIAXone 2 g in dextrose 5 % 50 mL, Inject 2 g into the vein daily., Disp: 42 packet, Rfl: 0 .  CELEBREX 200 MG capsule, Take 200 mg by mouth 2 (two) times daily. , Disp: , Rfl:  .  Cholecalciferol (VITAMIN D3) 1000 UNITS CAPS, Take 1,000 Units by mouth daily., Disp: , Rfl:  .  diazepam (VALIUM) 5 MG tablet, Take 1 tablet (5 mg total) by mouth every 6 (six) hours as needed for muscle  spasms., Disp: 30 tablet, Rfl: 0 .  docusate sodium (COLACE) 100 MG capsule, Take 100 mg by mouth daily as needed for mild constipation., Disp: , Rfl:  .  doxycycline (VIBRA-TABS) 100 MG tablet, Take 1 tablet (100 mg total) by mouth 2 (two) times daily., Disp: 60 tablet, Rfl: 11 .  enoxaparin (LOVENOX) 40 MG/0.4ML injection, Inject 0.4 mLs (40 mg total) into the skin daily., Disp: 12 Syringe, Rfl: 0 .  estradiol (ESTRACE) 0.5 MG tablet, Take 0.5 mg by mouth at bedtime. , Disp: , Rfl:  .  ferrous sulfate 325 (65 FE) MG tablet, Take 1 tablet (325 mg total) by mouth daily with breakfast. (Patient taking differently: Take 325 mg by mouth daily with lunch. ), Disp: , Rfl: 3 .  folic acid (FOLVITE) 1 MG tablet, Take 2 mg by mouth daily., Disp: , Rfl:  .  furosemide (LASIX) 40 MG tablet, Take 1 tablet (40 mg total) by mouth daily., Disp: 30 tablet, Rfl: 3 .  guaiFENesin (MUCINEX) 600 MG 12 hr tablet, Take 600 mg by mouth daily as needed (sinus relief). , Disp: , Rfl:  .  latanoprost (XALATAN) 0.005 % ophthalmic solution, Place 1 drop into both eyes at bedtime., Disp: , Rfl:  .  levothyroxine (SYNTHROID) 88 MCG tablet, Take 88 mcg by mouth daily before breakfast. DAW, DO NOT SUBSTITUTE WITH GENERIC, Disp: , Rfl:  .  medroxyPROGESTERone (PROVERA) 2.5 MG tablet, Take 2.5 mg by mouth at bedtime. , Disp: , Rfl:  .  metoprolol succinate (TOPROL-XL) 25 MG 24 hr tablet, Take 1 tablet (25 mg total) by mouth 2 (two) times daily. Take with or immediately following a meal., Disp: 60 tablet, Rfl: 3 .  Multiple Vitamin (MULTIVITAMIN WITH MINERALS) TABS, Take 1 tablet by mouth daily with lunch. , Disp: , Rfl:  .  oxyCODONE (OXY IR/ROXICODONE) 5 MG immediate release tablet, Take 1-3 tablets (5-15 mg total) by mouth every 4 (four) hours as needed for severe pain., Disp: 120 tablet, Rfl: 0 .  polyethylene glycol (MIRALAX / GLYCOLAX) packet,  Take 17 g by mouth 2 (two) times daily. (Patient taking differently: Take 17 g by  mouth 2 (two) times daily as needed (constipation). ), Disp: 14 each, Rfl: 0 .  polyvinyl alcohol (LIQUIFILM TEARS) 1.4 % ophthalmic solution, Place 1 drop into both eyes 2 (two) times daily as needed for dry eyes., Disp: , Rfl:  .  potassium chloride SA (K-DUR,KLOR-CON) 20 MEQ tablet, Take 2 tablets (40 mEq total) by mouth daily., Disp: 30 tablet, Rfl: 3 .  predniSONE (DELTASONE) 5 MG tablet, Take 5 mg by mouth daily with breakfast., Disp: , Rfl:  .  Simethicone 125 MG TABS, Take 1 tablet (125 mg total) by mouth every 8 (eight) hours as needed. (Patient taking differently: Take 1 tablet by mouth every 8 (eight) hours as needed (flatulence). ), Disp: 90 tablet, Rfl: 3 .  Vancomycin (VANCOCIN) 750 MG/150ML SOLN, Inject 150 mLs (750 mg total) into the vein daily., Disp: 6300 mL, Rfl: 0 .  warfarin (COUMADIN) 4 MG tablet, Take one-half tablet (2mg ) daily, Disp: 30 tablet, Rfl: 3    Review of Systems  Constitutional: Negative for fever, chills, diaphoresis, activity change, appetite change, fatigue and unexpected weight change.  HENT: Negative for congestion, rhinorrhea, sinus pressure, sneezing, sore throat and trouble swallowing.   Eyes: Negative for photophobia and visual disturbance.  Respiratory: Negative for cough, chest tightness, shortness of breath, wheezing and stridor.   Cardiovascular: Negative for chest pain, palpitations and leg swelling.  Gastrointestinal: Negative for nausea, vomiting, abdominal pain, diarrhea, constipation, blood in stool, abdominal distention and anal bleeding.  Genitourinary: Negative for dysuria, hematuria, flank pain and difficulty urinating.  Musculoskeletal: Positive for myalgias and joint swelling. Negative for back pain, arthralgias and gait problem.  Skin: Positive for color change and wound. Negative for pallor and rash.  Neurological: Negative for dizziness, tremors, weakness and light-headedness.  Hematological: Negative for adenopathy. Does not  bruise/bleed easily.  Psychiatric/Behavioral: Negative for behavioral problems, confusion, sleep disturbance, dysphoric mood, decreased concentration and agitation.       Objective:   Physical Exam  Constitutional: She is oriented to person, place, and time. No distress.  HENT:  Head: Normocephalic and atraumatic.  Mouth/Throat: No oropharyngeal exudate.  Eyes: Conjunctivae and EOM are normal. No scleral icterus.  Neck: Normal range of motion. Neck supple.  Cardiovascular: Normal rate and regular rhythm.   Pulmonary/Chest: Effort normal. No respiratory distress. She has no wheezes.  Abdominal: She exhibits no distension.  Musculoskeletal: She exhibits no edema or tenderness.  Neurological: She is alert and oriented to person, place, and time. She exhibits normal muscle tone. Coordination normal.  Skin: Skin is warm and dry. She is not diaphoretic. There is erythema.     Psychiatric: She has a normal mood and affect. Her behavior is normal. Judgment and thought content normal.    Left arm with erythema and edema. Left elbow is also quite tender 03/29/15:    Left knee with surgical scar that is well-healed 03/29/2015:     Left knee 04/20/15: few areas of nodular likely scar tissue proximally near wound         Assessment & Plan:   Prosthetic joint infection sp I&D and exchange sp IV vancomycin to oral doxy and keflex  with recurrence on therapy. We took her off the doxycycline to ensure better coverage organism in the operating room. She underwent removal of all the prosthetic material other than the tibial component on December 19 but cultures did not yield an organism. She's  currently on vancomycin and ceftriaxone.  We will check labs today. I would prefer to push the doxycycline  Potentially into May which would make 6 months  Then we could trial her off abx and see if she is safe for reimplanation  I will talk with Dr. Alvan Dame over phone (just texted him now)   Nodule  where PICC had been: recommend SNF check INR and ensure he is anticoagulated with coumadin.  I think this is at minimum a superficial DVT  I would also recommend repeating Doppler   I do worry still there could be a DVT that was not picked up on the initial Doppler and I would consider doing a repeat Doppler study.    Atrial fibrillation with RVR: On Coumadin and beta blocker  RA and UC: hold anti-TNF therapy while recovering from her infection over next half year  Diarrhea: still a bit of a problem but not worse  Lower extremity edema: Likely due to her recent heart failure due to atrial fibrillation defer to primary care would be careful though with diuretics given the fact she is on vancomycin currently  I spent greater than 40 minutes with the patient including greater than 50% of time in face to face counsel of the patient of her and her husband PJI, lRA and UC, erythema in arm, lower extremity edema, diarrhea and in coordination of her care.

## 2015-04-21 ENCOUNTER — Telehealth: Payer: Self-pay | Admitting: *Deleted

## 2015-04-21 LAB — SEDIMENTATION RATE: SED RATE: 12 mm/h (ref 0–30)

## 2015-04-21 NOTE — Telephone Encounter (Signed)
-----   Message from Truman Hayward, MD sent at 04/21/2015  9:00 AM EST ----- k was 5.7 should have rechecked at [facility]

## 2015-04-21 NOTE — Telephone Encounter (Signed)
Perfect

## 2015-04-21 NOTE — Telephone Encounter (Signed)
Notified Betty,RN at [facility] [phone]).  They drew labs yesterday morning, patient's K=4.8. Last INR was 2/1, = 2.6. Patient on 4mg  coumadin.   RN relayed repeat BMP and INR orders per Dr. Tommy Medal.  They will be drawn and results faxed to [phone]. Landis Gandy, RN

## 2015-04-26 ENCOUNTER — Ambulatory Visit (INDEPENDENT_AMBULATORY_CARE_PROVIDER_SITE_OTHER): Payer: Medicare Other | Admitting: Pharmacotherapy

## 2015-04-26 ENCOUNTER — Encounter: Payer: Self-pay | Admitting: Pharmacotherapy

## 2015-04-26 VITALS — BP 92/66 | HR 69 | Temp 97.6°F | Resp 20 | Ht 68.0 in | Wt 133.2 lb

## 2015-04-26 DIAGNOSIS — Z7901 Long term (current) use of anticoagulants: Secondary | ICD-10-CM

## 2015-04-26 DIAGNOSIS — Z5181 Encounter for therapeutic drug level monitoring: Secondary | ICD-10-CM | POA: Diagnosis not present

## 2015-04-26 DIAGNOSIS — I480 Paroxysmal atrial fibrillation: Secondary | ICD-10-CM | POA: Diagnosis not present

## 2015-04-26 LAB — POCT INR: INR: 2.7

## 2015-04-26 MED ORDER — WARFARIN SODIUM 4 MG PO TABS: ORAL_TABLET | ORAL | Status: DC

## 2015-04-26 NOTE — Patient Instructions (Signed)
INR 2.7 Continue Coumadin 4mg  daily except 2mg  on Tuesdays. Thursdays, Saturdays

## 2015-04-26 NOTE — Progress Notes (Signed)
   Subjective:    Patient ID: [name], female    DOB: 09-03-1934, 81 y.o.   MRN: [id number]  HPI Has been living at [facility]. Her current dose of warfarin is 4mg  QD except 2mg  T/Th/Sat Denies unusual bleeding or bruising. Denies CP, falls Consistent with vitamin K intake.   Review of Systems  HENT: Negative for nosebleeds.   Cardiovascular: Negative for chest pain and palpitations.  Gastrointestinal: Negative for blood in stool and anal bleeding.  Genitourinary: Negative for hematuria.  Hematological: Bruises/bleeds easily.       Objective:   Physical Exam  Constitutional: She is oriented to person, place, and time. She appears well-developed and well-nourished.  HENT:  Right Ear: External ear normal.  Left Ear: External ear normal.  Cardiovascular: Normal rate, regular rhythm and normal heart sounds.   Pulmonary/Chest: Effort normal and breath sounds normal.  Neurological: She is alert and oriented to person, place, and time.  Skin: Skin is warm and dry.  Psychiatric: She has a normal mood and affect. Her behavior is normal. Judgment and thought content normal.  Vitals reviewed.    BP:  92/66  HR: 69  Wt: 133lb INR 2.7     Assessment & Plan:  1.  INR at goal 2-3 2.  Continue warfarin 4mg  QD except 2mg  T/Th/Sat 3.  [facility] to manage anticoagulation until discharge.

## 2015-04-30 ENCOUNTER — Ambulatory Visit (INDEPENDENT_AMBULATORY_CARE_PROVIDER_SITE_OTHER): Payer: Medicare Other | Admitting: Internal Medicine

## 2015-04-30 ENCOUNTER — Encounter: Payer: Self-pay | Admitting: Internal Medicine

## 2015-04-30 VITALS — BP 118/70 | HR 65 | Temp 98.5°F

## 2015-04-30 DIAGNOSIS — M05711 Rheumatoid arthritis with rheumatoid factor of right shoulder without organ or systems involvement: Secondary | ICD-10-CM

## 2015-04-30 DIAGNOSIS — K5190 Ulcerative colitis, unspecified, without complications: Secondary | ICD-10-CM

## 2015-04-30 DIAGNOSIS — T8454XA Infection and inflammatory reaction due to internal left knee prosthesis, initial encounter: Secondary | ICD-10-CM

## 2015-04-30 DIAGNOSIS — Z89529 Acquired absence of unspecified knee: Secondary | ICD-10-CM

## 2015-04-30 DIAGNOSIS — Z899 Acquired absence of limb, unspecified: Secondary | ICD-10-CM

## 2015-04-30 DIAGNOSIS — I480 Paroxysmal atrial fibrillation: Secondary | ICD-10-CM

## 2015-04-30 DIAGNOSIS — I5033 Acute on chronic diastolic (congestive) heart failure: Secondary | ICD-10-CM

## 2015-04-30 NOTE — Progress Notes (Signed)
Patient ID: [name], female   DOB: 09-06-1934, 81 y.o.   MRN: [id number]   Location: [place] Provider: Rexene Edison. Mariea Clonts, D.O., C.M.D.  Code Status: full code Goals of Care: Advanced Directive information Does patient have an advance directive?: No  Chief Complaint  Patient presents with  . Medical Management of Chronic Issues    medication management     HPI: Patient is a 81 y.o. female seen in the office today for med mgt of her chronic diseases--she is still at [facility] and being followed by Dr. Philip Aspen there.  She says she wanted to come here to see if I could tell her more about her current situation and if I could help.    Outside of ongoing abx for her infection and med administration, she reports that she really isn't getting any care at SNF now.  She says she does things for herself.  The food is terrible.    When she saw ID, she had gained fluid weight and her diuretics were adjusted plus she was started on compression wraps with coban which have helped tremendously.  Is frustrated that she is not able to walk and take care of herself Still taking her doxycycline 100mg  po bid Says she wants surgery tomorrow and the hell with everything else. Says she could have been infected before but she could at least walk and not be in pain.  She says they couldn't even take out the whole prosthesis so the infected part could be in there anyway.   No DVT or superficial thrombosis was seen on the [place] at Dr. Arlyss Queen office.    She has written a letter to Dr. Alvan Dame requesting he do her surgery asap to replace her left knee.  Her insurance is running out for her rehab stay and she will need to go home as of March.  We discussed home health options and her husband had a copy of a brochure from [employer] with covered services.  She apparently has her bedroom and bathroom on the second floor (up 16 steps) and cannot use stairs at this time).  She is needing some adl help  and a home safety eval.  She does not want to wait until May as planned by ID and orthopedics even though this will mean she is at higher risk for persistent infection in the joint.  She does not seem to believe that there was ever infection since it could not be outright proven due to the antibiotics she required.  She follows up with Dr. Drucilla Schmidt 5/1 and Danae Orleans at ortho on 3/1.    Review of Systems:  Review of Systems  Constitutional: Positive for malaise/fatigue. Negative for fever and chills.  Eyes: Negative for blurred vision.  Respiratory: Negative for shortness of breath.   Cardiovascular: Negative for chest pain.  Gastrointestinal: Positive for diarrhea. Negative for abdominal pain, constipation, blood in stool and melena.       UC off immunosuppressants due to abx  Genitourinary: Positive for frequency. Negative for dysuria and urgency.  Musculoskeletal: Positive for joint pain. Negative for falls.       Left knee  Neurological: Positive for weakness. Negative for dizziness and headaches.  Psychiatric/Behavioral: Positive for depression. Negative for memory loss.       Says she will die of boredom    Past Medical History  Diagnosis Date  . Ulcerative colitis ([place])   . Hypothyroidism   . Hypertension  pcp   dr Zayn Selley   [facility]  . Pneumonia 2008  . Chronic bronchitis ([place]) 10/31/2011    "prone to it; I have sinus/bronchitis problem probably q yr"  . Multiple skin tears 10/31/2011    "get them very easily; my skin is thin and I bruise easily"  . Chronic back pain     "my entire spine"  . Edema   . Other and unspecified hyperlipidemia   . Encounter for long-term (current) use of other medications   . Senile osteoporosis   . Spinal stenosis, unspecified region other than cervical   . Unspecified glaucoma   . Hypothyroidism   . Anxiety     sometimes   . Rheumatoid arthritis(714.0)     degenerative disc disease   . Swelling of right upper extremity 11/26/2014    . Diarrhea 01/27/2015  . Complication of anesthesia 2009    htn with block- for hand surgery   . Dysrhythmia     pt states taking coumadin for paroxysmal atrial fibrillation   . Peripheral vascular disease ([place])   . Depression   . Anemia   . History of blood transfusion   . Hard of hearing   . Dizziness   . Falls   . H/O clavicle fracture     left  . Superficial thrombophlebitis 04/20/2015    Past Surgical History  Procedure Laterality Date  . Back surgery    . Sinus surgery with instatrak    . Thyroidectomy, partial  1997  . Posterior fusion lumbar spine  10/31/2011    L2-3; 3-4  . Tonsillectomy      "when I was a small child"  . Functional endoscopic sinus surgery  1988  . Posterior fusion lumbar spine  11/2009    L3-4;  L4-5  . Joint replacement  2009; 2006    joints 2 fingers,rt hand; joint left thumb  . Spine surgery    . Posterior lumbar fusion N/A 08/05/13    T1, T2  . Eye surgery    . Anterior cervical decomp/discectomy fusion N/A 02/17/2014    Procedure: CERVICAL FOUR TO FIVE ANTERIOR CERVICAL DECOMPRESSION/DISCECTOMY FUSION 1 LEVEL;  Surgeon: [name], MD;  Location: [place] [facility];  Service: Neurosurgery;  Laterality: N/A;  C4-5 Anterior cervical decompression/diskectomy/fusion  . Cataract extraction Left 11/11/2013  . Cataract extraction Right 12/30/2013  . Total knee arthroplasty Left 06/16/2014    Procedure: TOTAL KNEE ARTHROPLASTY;  Surgeon: Paralee Cancel, MD;  Location: [facility];  Service: Orthopedics;  Laterality: Left;  . I&d knee with poly exchange Left 10/13/2014    Procedure: INCISION AND DRAINAGE LEFT TOTAL KNEE WITH POLY EXCHANGE;  Surgeon: Paralee Cancel, MD;  Location: [facility];  Service: Orthopedics;  Laterality: Left;  . Excisional total knee arthroplasty with antibiotic spacers Left 03/01/2015    Procedure: RESECTION LEFT TOTAL KNEE ARTHROPLASTY WITH PLACEMENT OF  ANTIBIOTIC SPACERS;  Surgeon: Paralee Cancel, MD;  Location: [facility];  Service: Orthopedics;   Laterality: Left;    No Known Allergies    Medication List       This list is accurate as of: 04/30/15 10:57 AM.  Always use your most recent med list.               acetaminophen 325 MG tablet  Commonly known as:  TYLENOL  Take 2 tablets (650 mg total) by mouth every 6 (six) hours as needed for mild pain (or Fever >/= 101).     balsalazide 750 MG  capsule  Commonly known as:  COLAZAL  Take 2,250 mg by mouth 2 (two) times daily.     Calcium + D3 600-200 MG-UNIT Tabs  Take 1 tablet by mouth daily with breakfast.     cefTRIAXone 2 g in dextrose 5 % 50 mL  Inject 2 g into the vein daily.     CELEBREX 200 MG capsule  Generic drug:  celecoxib  Take 200 mg by mouth 2 (two) times daily.     diazepam 5 MG tablet  Commonly known as:  VALIUM  Take 1 tablet (5 mg total) by mouth every 6 (six) hours as needed for muscle spasms.     docusate sodium 100 MG capsule  Commonly known as:  COLACE  Take 100 mg by mouth daily as needed for mild constipation.     doxycycline 100 MG tablet  Commonly known as:  VIBRA-TABS  Take 1 tablet (100 mg total) by mouth 2 (two) times daily.     enoxaparin 40 MG/0.4ML injection  Commonly known as:  LOVENOX  Inject 0.4 mLs (40 mg total) into the skin daily.     estradiol 0.5 MG tablet  Commonly known as:  ESTRACE  Take 0.5 mg by mouth at bedtime.     feeding supplement (PRO-STAT SUGAR FREE 64) Liqd  Take 30 mLs by mouth 3 (three) times daily with meals.     ferrous sulfate 325 (65 FE) MG tablet  Take 1 tablet (325 mg total) by mouth daily with breakfast.     folic acid 1 MG tablet  Commonly known as:  FOLVITE  Take 2 mg by mouth daily.     furosemide 40 MG tablet  Commonly known as:  LASIX  Take 1 tablet (40 mg total) by mouth daily.     guaiFENesin 600 MG 12 hr tablet  Commonly known as:  MUCINEX  Take 600 mg by mouth daily as needed (sinus relief).     latanoprost 0.005 % ophthalmic solution  Commonly known as:  XALATAN  Place 1  drop into both eyes at bedtime.     medroxyPROGESTERone 2.5 MG tablet  Commonly known as:  PROVERA  Take 2.5 mg by mouth at bedtime.     metoprolol succinate 25 MG 24 hr tablet  Commonly known as:  TOPROL-XL  Take 1 tablet (25 mg total) by mouth 2 (two) times daily. Take with or immediately following a meal.     multivitamin with minerals Tabs tablet  Take 1 tablet by mouth daily with lunch.     oxyCODONE 5 MG immediate release tablet  Commonly known as:  Oxy IR/ROXICODONE  Take 1-3 tablets (5-15 mg total) by mouth every 4 (four) hours as needed for severe pain.     polyethylene glycol packet  Commonly known as:  MIRALAX / GLYCOLAX  Take 17 g by mouth 2 (two) times daily.     polyvinyl alcohol 1.4 % ophthalmic solution  Commonly known as:  LIQUIFILM TEARS  Place 1 drop into both eyes 2 (two) times daily as needed for dry eyes.     potassium chloride SA 20 MEQ tablet  Commonly known as:  K-DUR,KLOR-CON  Take 2 tablets (40 mEq total) by mouth daily.     predniSONE 5 MG tablet  Commonly known as:  DELTASONE  Take 5 mg by mouth daily with breakfast.     Simethicone 125 MG Tabs  Take 1 tablet (125 mg total) by mouth every 8 (eight) hours as needed.  SYNTHROID 88 MCG tablet  Generic drug:  levothyroxine  Take 88 mcg by mouth daily before breakfast. DAW, DO NOT SUBSTITUTE WITH GENERIC     Vitamin D3 1000 units Caps  Take 1,000 Units by mouth daily.     warfarin 4 MG tablet  Commonly known as:  COUMADIN  Take 1 tablet daily except 1/2 tablet on Tuesdays, Thursdays, and Saturdays        Health Maintenance  Topic Date Due  . ZOSTAVAX  01/26/1997  . PNA vac Low Risk Adult (2 of 2 - PPSV23) 03/27/2015  . INFLUENZA VACCINE  10/12/2015  . TETANUS/TDAP  08/15/2024  . DEXA SCAN  Completed    Physical Exam: Filed Vitals:   04/30/15 1036  SpO2: 99%   There is no weight on file to calculate BMI. Physical Exam  Constitutional: She is oriented to person, place, and  time. No distress.  Thin white female  Cardiovascular:  irreg irreg  Pulmonary/Chest: Effort normal and breath sounds normal.  Abdominal: Soft. Bowel sounds are normal.  Musculoskeletal:  Here in wheelchair  Neurological: She is alert and oriented to person, place, and time.  Skin: Skin is warm and dry. No erythema.  Left knee; had been wearing brace--no erythema or warmth, pain with any weightbearing when "shuffling" some and doing transfers; legs wrapped in unnaboots  Psychiatric:  Flat affect    Labs reviewed: Basic Metabolic Panel:  Recent Labs  08/24/14 1119  10/29/14 1903  03/03/15 0425 03/04/15 0850 03/26/15 0421 04/20/15 1451  NA 143  < > 140  < > 143 142 142 143  K 4.0  < > 3.7  < > 2.9* 3.1* 4.3 5.7*  CL 101  < > 105  < > 106 104 108 104  CO2 25  < > 30  < > 28 28  --  28  GLUCOSE 102*  < > 93  < > 96 124* 82 101*  BUN 26  < > 19  < > 31* 25* 26* 40*  CREATININE 0.87  < > 1.14*  < > 1.18* 1.06* 0.80 1.42*  CALCIUM 9.3  < > 8.5*  < > 7.4* 7.9*  --  9.3  MG  --   --  2.3  --   --   --   --   --   PHOS  --   --  3.7  --   --   --   --   --   TSH 0.457  --  0.851  --   --   --   --   --   < > = values in this interval not displayed. Liver Function Tests:  Recent Labs  10/30/14 0422 12/14/14 1041 12/25/14 1346  AST 21 37 41*  ALT 10* 13 15  ALKPHOS 118 118* 114  BILITOT 0.7 0.4 0.4  PROT 4.9* 6.0 5.9*  ALBUMIN 2.8* 4.0 4.0   No results for input(s): LIPASE, AMYLASE in the last 8760 hours. No results for input(s): AMMONIA in the last 8760 hours. CBC:  Recent Labs  01/11/15 1543  03/04/15 0850 03/26/15 0405 03/26/15 0421 04/20/15 1451  WBC 8.9  < > 6.5 5.4  --  6.1  NEUTROABS 7.3*  --   --  3.0  --  4.7  HGB  --   < > 8.5* 8.7* 9.9* 10.3*  HCT 37.4  < > 25.6* 28.4* 29.0* 33.6*  MCV 88  < > 87.4 95.0  --  93.6  PLT 221  < > 243 247  --  375  < > = values in this interval not displayed. Procedures since last visit: Doppler reviewed and negative  from ID appt  Assessment/Plan 1. Infection of prosthetic left knee joint ([place]) -counseled on benefits of completing the 6 mo course of abx doxycycline 100mg  po bid - she is not sure she wants to wait the full duration before having surgery due to her quality of life being poor as it is and the financial implications  -I have texted Dr. Tommy Medal via iodine to discuss her concerns with him  2. Rheumatoid arthritis involving right shoulder with positive rheumatoid factor (HCC) -cont her chronic pain regimen, off her immunosuppressive meds due to her infected joint  3. Left TK resection / spacer -to complete 6 wks abx before replacement of joint if clear -she is frustrated and resistant to waiting right now  4. Acute on chronic diastolic CHF (congestive heart failure), NYHA class 1 (HCC) -weight improved per her report (would not get up for weight today) -using unnaboots again for edema which have helped   5. Paroxysmal atrial fibrillation (HCC) -cont on coumadin with INR checks at [facility] until discharge when she will return to [facility] here for coumadin mgt, goal 2-3 -rate controlled at present with metoprolol succinate  6. Ulcerative colitis, chronic, without complications (HCC) -having diarrhea due to being off her injectable immunosuppressant due to her joint infection, is still on colazal--mentions that this will make being at home really difficult due to frequent trips to restroom  We will try to enroll her in the [facility] program when she is discharged from [facility] so maybe they can help with some of the safety interventions needed at her home. She was given a brochure with the phone number today.   Next appt:  F/u upon d/c from [place] [place]. Tarsha Blando, D.O. [facility] [address], [place] [address] Cell Phone (Mon-Fri 8am-5pm):  [phone] On Call:  [phone] & follow prompts after 5pm &  weekends Office Phone:  [phone] Office Fax:  [phone]

## 2015-05-28 DIAGNOSIS — M069 Rheumatoid arthritis, unspecified: Secondary | ICD-10-CM | POA: Diagnosis not present

## 2015-05-28 DIAGNOSIS — T8454XD Infection and inflammatory reaction due to internal left knee prosthesis, subsequent encounter: Secondary | ICD-10-CM | POA: Diagnosis not present

## 2015-05-28 DIAGNOSIS — I10 Essential (primary) hypertension: Secondary | ICD-10-CM | POA: Diagnosis not present

## 2015-05-28 DIAGNOSIS — R600 Localized edema: Secondary | ICD-10-CM | POA: Diagnosis not present

## 2015-05-28 DIAGNOSIS — M810 Age-related osteoporosis without current pathological fracture: Secondary | ICD-10-CM | POA: Diagnosis not present

## 2015-05-28 DIAGNOSIS — J449 Chronic obstructive pulmonary disease, unspecified: Secondary | ICD-10-CM | POA: Diagnosis not present

## 2015-06-08 ENCOUNTER — Encounter: Payer: Self-pay | Admitting: *Deleted

## 2015-06-11 NOTE — Telephone Encounter (Signed)
Erroneous encounter

## 2015-06-21 ENCOUNTER — Encounter: Payer: Self-pay | Admitting: Internal Medicine

## 2015-06-21 ENCOUNTER — Ambulatory Visit (INDEPENDENT_AMBULATORY_CARE_PROVIDER_SITE_OTHER): Payer: Medicare Other | Admitting: Internal Medicine

## 2015-06-21 VITALS — BP 140/80 | HR 66 | Temp 98.6°F

## 2015-06-21 DIAGNOSIS — Z720 Tobacco use: Secondary | ICD-10-CM | POA: Diagnosis not present

## 2015-06-21 DIAGNOSIS — M05711 Rheumatoid arthritis with rheumatoid factor of right shoulder without organ or systems involvement: Secondary | ICD-10-CM

## 2015-06-21 DIAGNOSIS — Z7901 Long term (current) use of anticoagulants: Secondary | ICD-10-CM

## 2015-06-21 DIAGNOSIS — T8454XA Infection and inflammatory reaction due to internal left knee prosthesis, initial encounter: Secondary | ICD-10-CM | POA: Diagnosis not present

## 2015-06-21 DIAGNOSIS — Z5181 Encounter for therapeutic drug level monitoring: Secondary | ICD-10-CM | POA: Diagnosis not present

## 2015-06-21 DIAGNOSIS — Z01818 Encounter for other preprocedural examination: Secondary | ICD-10-CM | POA: Diagnosis not present

## 2015-06-21 DIAGNOSIS — Z899 Acquired absence of limb, unspecified: Secondary | ICD-10-CM | POA: Diagnosis not present

## 2015-06-21 DIAGNOSIS — Z89529 Acquired absence of unspecified knee: Secondary | ICD-10-CM

## 2015-06-21 LAB — POCT INR: INR: 1

## 2015-06-21 NOTE — Progress Notes (Signed)
Patient ID: [name], female   DOB: [birth year], 81 y.o.   MRN: [id number]   Location:  [facility] clinic Provider:  [place] Pirie L. Mariea Clonts, D.O., C.M.D.  Code Status: full code Goals of Care:  Advanced Directives 06/21/2015  Does patient have an advance directive? No  Would patient like information on creating an advanced directive? Yes - Multimedia programmer Complaint  Patient presents with  . Medical Management of Chronic Issues    surgical clearance for knee replacement    HPI: Patient is a 81 y.o. female seen today for "medical clearance" for her left knee replacement.    Says she did fine at home limping carefully which was the same condition she'd been in at [facility].  At [facility] the left knee had "turned right" and she wound up going to Dr. Aurea Graff and they said it was part of the healing process.  Now turns each time she stands--pt thinks spacer gave out.  It's agonizing to stand.  Has to use the chair to use the restroom. She did have [place] home health.   She wound up going back to [facility] after that.  She is now paying to stay there b/c her insurance has run out.  She has some part time folks that are now keeping her moving a little bit.  She does her exercises she was taught.    Discussed that coumadin and celebrex would need to be held before her surgery.  She has not had her surgery visit with such instructions yet.    Still smoking 1 cigarette per day.    Colitis has flared since she's off her DMARDs and may not be back on due to infection risk for  a long time if ever.  Past Medical History  Diagnosis Date  . Ulcerative colitis ([place])   . Hypothyroidism   . Hypertension     pcp   dr Sante Biedermann   [facility]  . Pneumonia 2008  . Chronic bronchitis ([place]) 10/31/2011    "prone to it; I have sinus/bronchitis problem probably q yr"  . Multiple skin tears 10/31/2011    "get them very easily; my skin is thin and I bruise easily"  . Chronic back pain     "my entire  spine"  . Edema   . Other and unspecified hyperlipidemia   . Encounter for long-term (current) use of other medications   . Senile osteoporosis   . Spinal stenosis, unspecified region other than cervical   . Unspecified glaucoma   . Hypothyroidism   . Anxiety     sometimes   . Rheumatoid arthritis(714.0)     degenerative disc disease   . Swelling of right upper extremity 11/26/2014  . Diarrhea 01/27/2015  . Complication of anesthesia 2009    htn with block- for hand surgery   . Dysrhythmia     pt states taking coumadin for paroxysmal atrial fibrillation   . Peripheral vascular disease ([place])   . Depression   . Anemia   . History of blood transfusion   . Hard of hearing   . Dizziness   . Falls   . H/O clavicle fracture     left  . Superficial thrombophlebitis 04/20/2015    Past Surgical History  Procedure Laterality Date  . Back surgery    . Sinus surgery with instatrak    . Thyroidectomy, partial  1997  . Posterior fusion lumbar spine  10/31/2011    L2-3; 3-4  .  Tonsillectomy      "when I was a small child"  . Functional endoscopic sinus surgery  1988  . Posterior fusion lumbar spine  11/2009    L3-4;  L4-5  . Joint replacement  2009; 2006    joints 2 fingers,rt hand; joint left thumb  . Spine surgery    . Posterior lumbar fusion N/A 08/05/13    T1, T2  . Eye surgery    . Anterior cervical decomp/discectomy fusion N/A 02/17/2014    Procedure: CERVICAL FOUR TO FIVE ANTERIOR CERVICAL DECOMPRESSION/DISCECTOMY FUSION 1 LEVEL;  Surgeon: [name], MD;  Location: [facility];  Service: Neurosurgery;  Laterality: N/A;  C4-5 Anterior cervical decompression/diskectomy/fusion  . Cataract extraction Left 11/11/2013  . Cataract extraction Right 12/30/2013  . Total knee arthroplasty Left 06/16/2014    Procedure: TOTAL KNEE ARTHROPLASTY;  Surgeon: Paralee Cancel, MD;  Location: [facility];  Service: Orthopedics;  Laterality: Left;  . I&d knee with poly exchange Left 10/13/2014     Procedure: INCISION AND DRAINAGE LEFT TOTAL KNEE WITH POLY EXCHANGE;  Surgeon: Paralee Cancel, MD;  Location: [facility];  Service: Orthopedics;  Laterality: Left;  . Excisional total knee arthroplasty with antibiotic spacers Left 03/01/2015    Procedure: RESECTION LEFT TOTAL KNEE ARTHROPLASTY WITH PLACEMENT OF  ANTIBIOTIC SPACERS;  Surgeon: Paralee Cancel, MD;  Location: [facility];  Service: Orthopedics;  Laterality: Left;    No Known Allergies    Medication List       This list is accurate as of: 06/21/15 11:34 AM.  Always use your most recent med list.               balsalazide 750 MG capsule  Commonly known as:  COLAZAL  Take 2,250 mg by mouth 2 (two) times daily.     Calcium + D3 600-200 MG-UNIT Tabs  Take 1 tablet by mouth daily with breakfast.     CELEBREX 200 MG capsule  Generic drug:  celecoxib  Take 200 mg by mouth 2 (two) times daily.     [employer] SENNA PLUS 8.6-50 MG tablet  Generic drug:  senna-docusate  TAKE 1 TABLET BY MOUTH DAILY AS NEEDED FOR CONSTIPATION     diazepam 5 MG tablet  Commonly known as:  VALIUM  Take 1 tablet (5 mg total) by mouth every 6 (six) hours as needed for muscle spasms.     doxycycline 100 MG tablet  Commonly known as:  VIBRA-TABS  Take 1 tablet (100 mg total) by mouth 2 (two) times daily.     estradiol 0.5 MG tablet  Commonly known as:  ESTRACE  Take 0.5 mg by mouth at bedtime.     ferrous sulfate 325 (65 FE) MG tablet  Take 325 mg by mouth daily with breakfast.     folic acid 1 MG tablet  Commonly known as:  FOLVITE  Take 2 mg by mouth daily.     furosemide 40 MG tablet  Commonly known as:  LASIX  Take 1 tablet (40 mg total) by mouth daily.     guaiFENesin 600 MG 12 hr tablet  Commonly known as:  MUCINEX  Take 600 mg by mouth daily as needed (sinus relief).     latanoprost 0.005 % ophthalmic solution  Commonly known as:  XALATAN  Place 1 drop into both eyes at bedtime.     medroxyPROGESTERone 2.5 MG tablet  Commonly known as:   PROVERA  Take 2.5 mg by mouth at bedtime.     metoprolol  succinate 25 MG 24 hr tablet  Commonly known as:  TOPROL-XL  Take 1 tablet (25 mg total) by mouth 2 (two) times daily. Take with or immediately following a meal.     multivitamin with minerals Tabs tablet  Take 1 tablet by mouth daily with lunch.     oxyCODONE 5 MG immediate release tablet  Commonly known as:  Oxy IR/ROXICODONE  Take 1-3 tablets (5-15 mg total) by mouth every 4 (four) hours as needed for severe pain.     polyethylene glycol packet  Commonly known as:  MIRALAX / GLYCOLAX  Take 17 g by mouth daily.     potassium chloride SA 20 MEQ tablet  Commonly known as:  K-DUR,KLOR-CON  Take 2 tablets (40 mEq total) by mouth daily.     predniSONE 5 MG tablet  Commonly known as:  DELTASONE  Take 5 mg by mouth daily with breakfast.     simethicone 125 MG chewable tablet  Commonly known as:  MYLICON  Chew 0000000 mg by mouth every 8 (eight) hours as needed.     spironolactone 25 MG tablet  Commonly known as:  ALDACTONE     SYNTHROID 88 MCG tablet  Generic drug:  levothyroxine  Take 88 mcg by mouth daily before breakfast. DAW, DO NOT SUBSTITUTE WITH GENERIC     Vitamin D3 1000 units Caps  Take 1,000 Units by mouth daily.        Review of Systems:  Review of Systems  Constitutional: Positive for weight loss. Negative for fever and chills.  Respiratory: Negative for shortness of breath.   Cardiovascular: Positive for leg swelling. Negative for chest pain.       Had not taken her lasix for several days, she says and then restarted it  Gastrointestinal: Positive for diarrhea. Negative for abdominal pain, blood in stool and melena.       Due to her UC  Genitourinary: Negative for dysuria.  Musculoskeletal: Positive for joint pain. Negative for falls.       Left knee which turns and wants to give way during transfers and self-care  Skin: Negative for rash.  Neurological: Positive for weakness. Negative for dizziness.    Psychiatric/Behavioral: Negative for memory loss.    Health Maintenance  Topic Date Due  . ZOSTAVAX  01/26/1997  . PNA vac Low Risk Adult (2 of 2 - PPSV23) 03/27/2015  . INFLUENZA VACCINE  10/12/2015  . TETANUS/TDAP  08/15/2024  . DEXA SCAN  Completed    Physical Exam: Filed Vitals:   06/21/15 1117  BP: 140/80  Pulse: 66  Temp: 98.6 F (37 C)  TempSrc: Oral  SpO2: 94%   There is no weight on file to calculate BMI. Physical Exam  Constitutional: She is oriented to person, place, and time. No distress.  HENT:  Head: Normocephalic and atraumatic.  Cardiovascular: Normal rate, regular rhythm and normal heart sounds.   Pulmonary/Chest: Effort normal and breath sounds normal.  Abdominal: Soft. Bowel sounds are normal.  Musculoskeletal:  In wheelchair  Neurological: She is alert and oriented to person, place, and time.  Seems slower thinking than she once was, but still appropriate  Skin: Skin is warm and dry.  Wearing her brace on her left knee  Psychiatric: She has a normal mood and affect.    Labs reviewed: Basic Metabolic Panel:  Recent Labs  08/24/14 1119  10/29/14 1903  03/03/15 0425 03/04/15 0850 03/26/15 0421 04/20/15 1451  NA 143  < > 140  < >  143 142 142 143  K 4.0  < > 3.7  < > 2.9* 3.1* 4.3 5.7*  CL 101  < > 105  < > 106 104 108 104  CO2 25  < > 30  < > 28 28  --  28  GLUCOSE 102*  < > 93  < > 96 124* 82 101*  BUN 26  < > 19  < > 31* 25* 26* 40*  CREATININE 0.87  < > 1.14*  < > 1.18* 1.06* 0.80 1.42*  CALCIUM 9.3  < > 8.5*  < > 7.4* 7.9*  --  9.3  MG  --   --  2.3  --   --   --   --   --   PHOS  --   --  3.7  --   --   --   --   --   TSH 0.457  --  0.851  --   --   --   --   --   < > = values in this interval not displayed. Liver Function Tests:  Recent Labs  10/30/14 0422 12/14/14 1041 12/25/14 1346  AST 21 37 41*  ALT 10* 13 15  ALKPHOS 118 118* 114  BILITOT 0.7 0.4 0.4  PROT 4.9* 6.0 5.9*  ALBUMIN 2.8* 4.0 4.0   No results for  input(s): LIPASE, AMYLASE in the last 8760 hours. No results for input(s): AMMONIA in the last 8760 hours. CBC:  Recent Labs  01/11/15 1543  03/04/15 0850 03/26/15 0405 03/26/15 0421 04/20/15 1451  WBC 8.9  < > 6.5 5.4  --  6.1  NEUTROABS 7.3*  --   --  3.0  --  4.7  HGB  --   < > 8.5* 8.7* 9.9* 10.3*  HCT 37.4  < > 25.6* 28.4* 29.0* 33.6*  MCV 88  < > 87.4 95.0  --  93.6  PLT 221  < > 243 247  --  375  < > = values in this interval not displayed. Lipid Panel: No results for input(s): CHOL, HDL, LDLCALC, TRIG, CHOLHDL, LDLDIRECT in the last 8760 hours. No results found for: HGBA1C  EKG today:  Sinus rhythm at 64bpm  Assessment/Plan 1. Infection of prosthetic left knee joint ([place]) - continues on her doxy (ordered thru 5/19 for now)  -for total knee redo on 07/05/15 with Dr. Alvan Dame  - CBC with Differential/Platelet was to be done today, but pt was dismissed w/o labs unfortunately  2. Left TK resection / spacer -h/o  -has not been ambulatory for several months as a result and will be completing 6 mos of abx in May -has difficulty with pain in the knee during transfers and continues on opioids for this at [facility] -was unable to safely care for herself at home after therapy there and had to return (I learned that she'd gone back when she came for the appt today)  3. Rheumatoid arthritis involving right shoulder with positive rheumatoid factor (HCC) -severe chronic pain as a result and off her DMARDs, on steroids, due to her infection  4. Anticoagulation goal of INR 2 to 3 for afib on coumadin--so I thought -when she came today, her MAR did not show coumadin - CMA contacted [facility] and nursing reported that coumadin was stopped - pt has never been in afib in my presence, on EKGs here, but was said to have been at [facility] before and got started on the coumadin and metoprolol there -she reported  that she had been supratherapeutic at 8 at one point and was held and she had it tested  once since -I have sent a message to Dr. Philip Aspen who is taking care of her there to find out if coumadin was intentionally discontinued as INR today was 1.0 - POCT INR  5. Preoperative clearance - EKG unremarkable today and she is eagerly awaiting surgery -will need to be off coumadin for a week (if she was to restart it) and also should not take celebrex for a few days before due to potential bleeding risks with nsaids -pt left w/o getting the labs I ordered (I'm sure they will be done at her preop visit though) - TSH - CBC with Differential/Platelet - Basic metabolic panel - EKG [id number] - POCT INR -I'm sure she will need PT postop after all of the muscle mass she has lost the past several mos and will need adequate social support (doubt that is possible at home)  6. Tobacco abuse -ongoing at 1 cigarette per day and still says she doesn't inhale it so won't quit  Labs/tests ordered:   Orders Placed This Encounter  Procedures  . TSH  . CBC with Differential/Platelet  . Basic metabolic panel  . POCT INR  . EKG 12-Lead    Order Specific Question:  Where should this test be performed    Answer:  OTHER    Next appt:  F/u post-hospitalization and rehab stay--again explained that she should be seen by the facility physicians while staying there and see me upon discharge home  [place]. Journie Howson, D.O. [facility] [address], [place] [address] Cell Phone (Mon-Fri 8am-5pm):  [phone] On Call:  [phone] & follow prompts after 5pm & weekends Office Phone:  [phone] Office Fax:  [phone]

## 2015-06-24 NOTE — Progress Notes (Signed)
That is my impression now.  She has a surgery date.

## 2015-06-28 ENCOUNTER — Encounter (HOSPITAL_COMMUNITY)
Admission: RE | Admit: 2015-06-28 | Discharge: 2015-06-28 | Disposition: A | Discharge status: Home / Self Care | Payer: Medicare Other | Source: Ambulatory Visit | Attending: Orthopedic Surgery | Admitting: Orthopedic Surgery

## 2015-06-28 ENCOUNTER — Encounter (HOSPITAL_COMMUNITY): Payer: Self-pay

## 2015-06-28 DIAGNOSIS — Z01812 Encounter for preprocedural laboratory examination: Secondary | ICD-10-CM | POA: Insufficient documentation

## 2015-06-28 HISTORY — DX: Other injury of unspecified body region, initial encounter: T14.8XXA

## 2015-06-28 LAB — SURGICAL PCR SCREEN
MRSA, PCR: NEGATIVE
Staphylococcus aureus: NEGATIVE

## 2015-06-28 LAB — URINALYSIS, ROUTINE W REFLEX MICROSCOPIC
Bilirubin Urine: NEGATIVE
GLUCOSE, UA: NEGATIVE mg/dL
HGB URINE DIPSTICK: NEGATIVE
Ketones, ur: NEGATIVE mg/dL
LEUKOCYTES UA: NEGATIVE
NITRITE: NEGATIVE
PH: 5.5 (ref 5.0–8.0)
PROTEIN: NEGATIVE mg/dL
SPECIFIC GRAVITY, URINE: 1.009 (ref 1.005–1.030)

## 2015-06-28 LAB — CBC
HCT: 37.9 % (ref 36.0–46.0)
Hemoglobin: 12 g/dL (ref 12.0–15.0)
MCH: 27.7 pg (ref 26.0–34.0)
MCHC: 31.7 g/dL (ref 30.0–36.0)
MCV: 87.5 fL (ref 78.0–100.0)
PLATELETS: 269 10*3/uL (ref 150–400)
RBC: 4.33 MIL/uL (ref 3.87–5.11)
RDW: 16.2 % — AB (ref 11.5–15.5)
WBC: 6.9 10*3/uL (ref 4.0–10.5)

## 2015-06-28 LAB — BASIC METABOLIC PANEL
Anion gap: 8 (ref 5–15)
BUN: 46 mg/dL — ABNORMAL HIGH (ref 6–20)
CALCIUM: 9.5 mg/dL (ref 8.9–10.3)
CO2: 25 mmol/L (ref 22–32)
CREATININE: 1.46 mg/dL — AB (ref 0.44–1.00)
Chloride: 110 mmol/L (ref 101–111)
GFR calc non Af Amer: 33 mL/min — ABNORMAL LOW (ref >60)
GFR, EST AFRICAN AMERICAN: 39 mL/min — AB (ref >60)
Glucose, Bld: 106 mg/dL — ABNORMAL HIGH (ref 65–99)
Potassium: 5.5 mmol/L — ABNORMAL HIGH (ref 3.5–5.1)
SODIUM: 143 mmol/L (ref 135–145)

## 2015-06-28 LAB — APTT: aPTT: 25 seconds (ref 24–37)

## 2015-06-28 LAB — PROTIME-INR
INR: 1.1 (ref 0.00–1.49)
PROTHROMBIN TIME: 14.4 s (ref 11.6–15.2)

## 2015-06-28 NOTE — Patient Instructions (Addendum)
KRISALYN [name]  06/28/2015   Your procedure is scheduled on:  07-05-15  Report to [facility]  Entrance take [facility]  elevators to 3rd floor to  [place] at 1030  Call this number if you have problems the morning of surgery [phone]   Remember: ONLY 1 PERSON MAY GO WITH YOU TO SHORT STAY TO GET  READY MORNING OF YOUR SURGERY.  Do not eat food or drink after midnight.  After Midnight,     Take these medicines the morning of surgery with A SIP OF WATER: Diazepam (Valium) as needed, Metoprolol, Levothyroxine, Oxycodone as needed, Prednisone                   PLEASE SEND LATEST COPY OF MEDICATION ADMINISTRATION WITH PATIENT DAY OF SURGERY              You may not have any metal on your body including hair pins and              piercings  Do not wear jewelry, make-up, lotions, powders or perfumes, deodorant             Do not wear nail polish.  Do not shave  48 hours prior to surgery.              Men may shave face and neck.   Do not bring valuables to the hospital. [place].  Contacts, dentures or bridgework may not be worn into surgery.  Leave suitcase in the car. After surgery it may be brought to your room.     Patients discharged the day of surgery will not be allowed to drive home.  Name and phone number of your driver:  Special Instructions: N/A              Please read over the following fact sheets you were given: _____________________________________________________________________                 _____________________________________________________________________  [facility] - Preparing for Surgery Before surgery, you can play an important role.  Because skin is not sterile, your skin needs to be as free of germs as possible.  You can reduce the number of germs on your skin by washing with CHG (chlorahexidine gluconate) soap before surgery.  CHG is an antiseptic cleaner which  kills germs and bonds with the skin to continue killing germs even after washing. Please DO NOT use if you have an allergy to CHG or antibacterial soaps.  If your skin becomes reddened/irritated stop using the CHG and inform your nurse when you arrive at Short Stay. Do not shave (including legs and underarms) for at least 48 hours prior to the first CHG shower.  You may shave your face/neck. Please follow these instructions carefully:  1.  Shower with CHG Soap the night before surgery and the  morning of Surgery.  2.  If you choose to wash your hair, wash your hair first as usual with your  normal  shampoo.  3.  After you shampoo, rinse your hair and body thoroughly to remove the  shampoo.                           4.  Use CHG as  you would any other liquid soap.  You can apply chg directly  to the skin and wash                       Gently with a scrungie or clean washcloth.  5.  Apply the CHG Soap to your body ONLY FROM THE NECK DOWN.   Do not use on face/ open                           Wound or open sores. Avoid contact with eyes, ears mouth and genitals (private parts).                       Wash face,  Genitals (private parts) with your normal soap.             6.  Wash thoroughly, paying special attention to the area where your surgery  will be performed.  7.  Thoroughly rinse your body with warm water from the neck down.  8.  DO NOT shower/wash with your normal soap after using and rinsing off  the CHG Soap.                9.  Pat yourself dry with a clean towel.            10.  Wear clean pajamas.            11.  Place clean sheets on your bed the night of your first shower and do not  sleep with pets. Day of Surgery : Do not apply any lotions/deodorants the morning of surgery.  Please wear clean clothes to the hospital/surgery center.  FAILURE TO FOLLOW THESE INSTRUCTIONS MAY RESULT IN THE CANCELLATION OF YOUR SURGERY PATIENT SIGNATURE_________________________________  NURSE  SIGNATURE__________________________________  ________________________________________________________________________   [name]  An incentive spirometer is a tool that can help keep your lungs clear and active. This tool measures how well you are filling your lungs with each breath. Taking long deep breaths may help reverse or decrease the chance of developing breathing (pulmonary) problems (especially infection) following:  A long period of time when you are unable to move or be active. BEFORE THE PROCEDURE   If the spirometer includes an indicator to show your best effort, your nurse or respiratory therapist will set it to a desired goal.  If possible, sit up straight or lean slightly forward. Try not to slouch.  Hold the incentive spirometer in an upright position. INSTRUCTIONS FOR USE   Sit on the edge of your bed if possible, or sit up as far as you can in bed or on a chair.  Hold the incentive spirometer in an upright position.  Breathe out normally.  Place the mouthpiece in your mouth and seal your lips tightly around it.  Breathe in slowly and as deeply as possible, raising the piston or the ball toward the top of the column.  Hold your breath for 3-5 seconds or for as long as possible. Allow the piston or ball to fall to the bottom of the column.  Remove the mouthpiece from your mouth and breathe out normally.  Rest for a few seconds and repeat Steps 1 through 7 at least 10 times every 1-2 hours when you are awake. Take your time and take a few normal breaths between deep breaths.  The spirometer may include an indicator to show your best effort. Use the  indicator as a goal to work toward during each repetition.  After each set of 10 deep breaths, practice coughing to be sure your lungs are clear. If you have an incision (the cut made at the time of surgery), support your incision when coughing by placing a pillow or rolled up towels firmly against it. Once  you are able to get out of bed, walk around indoors and cough well. You may stop using the incentive spirometer when instructed by your caregiver.  RISKS AND COMPLICATIONS  Take your time so you do not get dizzy or light-headed.  If you are in pain, you may need to take or ask for pain medication before doing incentive spirometry. It is harder to take a deep breath if you are having pain. AFTER USE  Rest and breathe slowly and easily.  It can be helpful to keep track of a log of your progress. Your caregiver can provide you with a simple table to help with this. If you are using the spirometer at home, follow these instructions: [place] IF:   You are having difficultly using the spirometer.  You have trouble using the spirometer as often as instructed.  Your pain medication is not giving enough relief while using the spirometer.  You develop fever of 100.5 F (38.1 C) or higher. SEEK IMMEDIATE MEDICAL CARE IF:   You cough up bloody sputum that had not been present before.  You develop fever of 102 F (38.9 C) or greater.  You develop worsening pain at or near the incision site. MAKE SURE YOU:   Understand these instructions.  Will watch your condition.  Will get help right away if you are not doing well or get worse. Document Released: 07/10/2006 Document Revised: 05/22/2011 Document Reviewed: 09/10/2006 ExitCare Patient Information 2014 ExitCare, [place].   ________________________________________________________________________  WHAT IS A BLOOD TRANSFUSION? Blood Transfusion Information  A transfusion is the replacement of blood or some of its parts. Blood is made up of multiple cells which provide different functions.  Red blood cells carry oxygen and are used for blood loss replacement.  White blood cells fight against infection.  Platelets control bleeding.  Plasma helps clot blood.  Other blood products are available for specialized needs, such as  hemophilia or other clotting disorders. BEFORE THE TRANSFUSION  Who gives blood for transfusions?   Healthy volunteers who are fully evaluated to make sure their blood is safe. This is blood bank blood. Transfusion therapy is the safest it has ever been in the practice of medicine. Before blood is taken from a donor, a complete history is taken to make sure that person has no history of diseases nor engages in risky social behavior (examples are intravenous drug use or sexual activity with multiple partners). The donor's travel history is screened to minimize risk of transmitting infections, such as malaria. The donated blood is tested for signs of infectious diseases, such as HIV and hepatitis. The blood is then tested to be sure it is compatible with you in order to minimize the chance of a transfusion reaction. If you or a relative donates blood, this is often done in anticipation of surgery and is not appropriate for emergency situations. It takes many days to process the donated blood. RISKS AND COMPLICATIONS Although transfusion therapy is very safe and saves many lives, the main dangers of transfusion include:   Getting an infectious disease.  Developing a transfusion reaction. This is an allergic reaction to something in the blood you  were given. Every precaution is taken to prevent this. The decision to have a blood transfusion has been considered carefully by your caregiver before blood is given. Blood is not given unless the benefits outweigh the risks. AFTER THE TRANSFUSION  Right after receiving a blood transfusion, you will usually feel much better and more energetic. This is especially true if your red blood cells have gotten low (anemic). The transfusion raises the level of the red blood cells which carry oxygen, and this usually causes an energy increase.  The nurse administering the transfusion will monitor you carefully for complications. HOME CARE INSTRUCTIONS  No special  instructions are needed after a transfusion. You may find your energy is better. Speak with your caregiver about any limitations on activity for underlying diseases you may have. SEEK MEDICAL CARE IF:   Your condition is not improving after your transfusion.  You develop redness or irritation at the intravenous (IV) site. SEEK IMMEDIATE MEDICAL CARE IF:  Any of the following symptoms occur over the next 12 hours:  Shaking chills.  You have a temperature by mouth above 102 F (38.9 C), not controlled by medicine.  Chest, back, or muscle pain.  People around you feel you are not acting correctly or are confused.  Shortness of breath or difficulty breathing.  Dizziness and fainting.  You get a rash or develop hives.  You have a decrease in urine output.  Your urine turns a dark color or changes to pink, red, or brown. Any of the following symptoms occur over the next 10 days:  You have a temperature by mouth above 102 F (38.9 C), not controlled by medicine.  Shortness of breath.  Weakness after normal activity.  The white part of the eye turns yellow (jaundice).  You have a decrease in the amount of urine or are urinating less often.  Your urine turns a dark color or changes to pink, red, or brown. Document Released: 02/25/2000 Document Revised: 05/22/2011 Document Reviewed: 10/14/2007 [facility] Patient Information 2014 [place], [place].  _______________________________________________________________________

## 2015-06-28 NOTE — Progress Notes (Signed)
ekg 02-25-15 epic

## 2015-06-28 NOTE — Progress Notes (Signed)
Sent copy of pre op instructions with pt spouse and faxed copy of pre op instructions to [facility] fax [phone], fax confirmation received and placed on chart, spoke with kelly adams LPN and informed her of spouse has copy of pre op instructions and copy was faxed also.

## 2015-06-29 NOTE — Progress Notes (Signed)
bmet results faxed by epic to dr olin 

## 2015-06-29 NOTE — H&P (Signed)
[name] is an 81 y.o. female.    Chief Complaint:    S/P resection of left TKA with placement of antibiotic spacer  Procedure:       Reimplantation of left TKA with removal of antibiotic spacer   HPI: Pt is a 81 y.o. female complaining of left knee pain.  Patient is  S/P resection of left TKA with placement of antibiotic spacer with a surgery scheduled for 07/05/2015 to reimplant the left TKA.  Patient is very much looking forward to the surgery.   Pt has previous procedure on the left knee including a TKA on 06/16/2014 and an I&D of the left TKA on 10/13/2014.  She then had the left TKA resected and an antibiotic spacer placed on  03/01/2015 and subsequently placed on IV antibiotics.  Various options are discussed with the patient. Risks, benefits and expectations were discussed with the patient. Patient understand the risks, benefits and expectations and wishes to proceed with surgery.   PCP: [name], [name], DO  D/C Plans:      Home/SNF  Post-op Meds:       No Rx given   Tranexamic Acid:      To be given - IV   Decadron:      Is to be given  FYI:ASA  Oxycodone Miralax and Colace as needed, pt has UC   PMH: Past Medical History  Diagnosis Date  . Ulcerative colitis ([place])   . Hypothyroidism   . Hypertension     pcp   dr reed   [facility]  . Pneumonia 2008  . Chronic bronchitis ([place]) 10/31/2011    "prone to it; I have sinus/bronchitis problem probably q yr"  . Multiple skin tears 10/31/2011    "get them very easily; my skin is thin and I bruise easily"  . Chronic back pain     "my entire spine"  . Edema   . Other and unspecified hyperlipidemia   . Encounter for long-term (current) use of other medications   . Senile osteoporosis   . Spinal stenosis, unspecified region other than cervical   . Unspecified glaucoma   . Hypothyroidism   . Anxiety     sometimes   . Swelling of right upper extremity 11/26/2014  . Diarrhea 01/27/2015  .  Peripheral vascular disease ([place])   . Depression   . Anemia   . History of blood transfusion   . Hard of hearing   . Dizziness   . Falls   . H/O clavicle fracture     left  . Superficial thrombophlebitis 04/20/2015  . Complication of anesthesia 2009    htn with block- for hand surgery , pt thinks anesthesia causing some long term confusion  . Dysrhythmia     pt states bring off coumadin for paroxysmal atrial fibrillation for last month  . Rheumatoid arthritis(714.0)     degenerative disc disease , ra  . Bruise     right lower extremity with opsite on    PSH: Past Surgical History  Procedure Laterality Date  . Back surgery    . Sinus surgery with instatrak    . Thyroidectomy, partial  1997  . Posterior fusion lumbar spine  10/31/2011    L2-3; 3-4  . Tonsillectomy      "when I was a small child"  . Functional endoscopic sinus surgery  1988  . Posterior fusion lumbar spine  11/2009    L3-4;  L4-5  . Spine surgery    .  Posterior lumbar fusion N/A 08/05/13    T1, T2  . Anterior cervical decomp/discectomy fusion N/A 02/17/2014    Procedure: CERVICAL FOUR TO FIVE ANTERIOR CERVICAL DECOMPRESSION/DISCECTOMY FUSION 1 LEVEL;  Surgeon: [name], MD;  Location: [facility];  Service: Neurosurgery;  Laterality: N/A;  C4-5 Anterior cervical decompression/diskectomy/fusion  . Cataract extraction Left 11/11/2013  . Cataract extraction Right 12/30/2013  . Total knee arthroplasty Left 06/16/2014    Procedure: TOTAL KNEE ARTHROPLASTY;  Surgeon: Paralee Cancel, MD;  Location: [facility] OR[facility];  Service: Orthopedics;  Laterality: Left;  . I&d knee with poly exchange Left 10/13/2014    Procedure: INCISION AND DRAINAGE LEFT TOTAL KNEE WITH POLY EXCHANGE;  Surgeon: Paralee Cancel, MD;  Location: [facility] OR[facility];  Service: Orthopedics;  Laterality: Left;  . Excisional total knee arthroplasty with antibiotic spacers Left 03/01/2015    Procedure: RESECTION LEFT TOTAL KNEE ARTHROPLASTY WITH PLACEMENT OF  ANTIBIOTIC SPACERS;   Surgeon: Paralee Cancel, MD;  Location: [facility];  Service: Orthopedics;  Laterality: Left;  Marland Kitchen Eye surgery Bilateral   . Joint replacement  2009; 2006    joints 2 fingers,rt hand; joint left thumb    Social History:  reports that she has been smoking Cigarettes.  She has a 12.5 pack-year smoking history. She has never used smokeless tobacco. She reports that she does not drink alcohol or use illicit drugs.  Allergies:  No Known Allergies  Medications: No current facility-administered medications for this encounter.   Current Outpatient Prescriptions  Medication Sig Dispense Refill  . balsalazide (COLAZAL) 750 MG capsule Take 2,250 mg by mouth 2 (two) times daily.     . Calcium Carb-Cholecalciferol (CALCIUM + D3) 600-200 MG-UNIT TABS Take 1 tablet by mouth daily with breakfast.     . CELEBREX 200 MG capsule Take 200 mg by mouth 2 (two) times daily.     . Cholecalciferol (VITAMIN D3) 1000 UNITS CAPS Take 1,000 Units by mouth daily.    . diazepam (VALIUM) 5 MG tablet Take 1 tablet (5 mg total) by mouth every 6 (six) hours as needed for muscle spasms. 30 tablet 0  . doxycycline (VIBRA-TABS) 100 MG tablet Take 1 tablet (100 mg total) by mouth 2 (two) times daily. 60 tablet 11  . estradiol (ESTRACE) 0.5 MG tablet Take 0.25 mg by mouth daily.    . ferrous sulfate 325 (65 FE) MG tablet Take 325 mg by mouth daily with breakfast.    . folic acid (FOLVITE) 1 MG tablet Take 2 mg by mouth daily.    . furosemide (LASIX) 40 MG tablet Take 1 tablet (40 mg total) by mouth daily. 30 tablet 3  . guaiFENesin (MUCINEX) 600 MG 12 hr tablet Take 600 mg by mouth daily as needed (sinus relief).     Marland Kitchen latanoprost (XALATAN) 0.005 % ophthalmic solution Place 1 drop into both eyes at bedtime.    Marland Kitchen levothyroxine (SYNTHROID) 88 MCG tablet Take 88 mcg by mouth daily before breakfast. DAW, DO NOT SUBSTITUTE WITH GENERIC    . medroxyPROGESTERone (PROVERA) 2.5 MG tablet Take 2.5 mg by mouth at bedtime.     . metoprolol  succinate (TOPROL-XL) 25 MG 24 hr tablet Take 1 tablet (25 mg total) by mouth 2 (two) times daily. Take with or immediately following a meal. 60 tablet 3  . Multiple Vitamin (MULTIVITAMIN WITH MINERALS) TABS Take 1 tablet by mouth daily with lunch.     . oxyCODONE (OXY IR/ROXICODONE) 5 MG immediate release tablet Take 1-3 tablets (5-15 mg total)  by mouth every 4 (four) hours as needed for severe pain. 120 tablet 0  . polyethylene glycol (MIRALAX / GLYCOLAX) packet Take 17 g by mouth daily as needed for mild constipation.     . potassium chloride SA (K-DUR,KLOR-CON) 20 MEQ tablet Take 2 tablets (40 mEq total) by mouth daily. 30 tablet 3  . predniSONE (DELTASONE) 1 MG tablet Take 4 mg by mouth daily.    Orlie Dakin Sodium (SENNA S PO) Take 1 tablet by mouth daily as needed (constipation.).    Marland Kitchen simethicone (MYLICON) 654 MG chewable tablet Chew 125 mg by mouth every 8 (eight) hours as needed.    Marland Kitchen spironolactone (ALDACTONE) 25 MG tablet Take 25 mg by mouth daily.     . [employer] SENNA PLUS 8.6-50 MG tablet TAKE 1 TABLET BY MOUTH DAILY AS NEEDED FOR CONSTIPATION  0    Results for orders placed or performed during the hospital encounter of 06/28/15 (from the past 48 hour(s))  Type and screen Order type and screen if day of surgery is less than 15 days from draw of preadmission visit or order morning of surgery if day of surgery is greater than 6 days from preadmission visit.     Status: None   Collection Time: 06/28/15  2:20 PM  Result Value Ref Range   ABO/RH(D) B POS    Antibody Screen NEG    Sample Expiration 07/12/2015    Extend sample reason NO TRANSFUSIONS OR PREGNANCY IN THE PAST 3 MONTHS   APTT     Status: None   Collection Time: 06/28/15  2:30 PM  Result Value Ref Range   aPTT 25 24 - 37 seconds  Basic metabolic panel     Status: Abnormal   Collection Time: 06/28/15  2:30 PM  Result Value Ref Range   Sodium 143 135 - 145 mmol/L   Potassium 5.5 (H) 3.5 - 5.1 mmol/L   Chloride 110  101 - 111 mmol/L   CO2 25 22 - 32 mmol/L   Glucose, Bld 106 (H) 65 - 99 mg/dL   BUN 46 (H) 6 - 20 mg/dL   Creatinine, Ser 1.46 (H) 0.44 - 1.00 mg/dL   Calcium 9.5 8.9 - 10.3 mg/dL   GFR calc non Af Amer 33 (L) >60 mL/min   GFR calc Af Amer 39 (L) >60 mL/min    Comment: (NOTE) The eGFR has been calculated using the CKD EPI equation. This calculation has not been validated in all clinical situations. eGFR's persistently <60 mL/min signify possible Chronic Kidney Disease.    Anion gap 8 5 - 15  CBC     Status: Abnormal   Collection Time: 06/28/15  2:30 PM  Result Value Ref Range   WBC 6.9 4.0 - 10.5 K/uL   RBC 4.33 3.87 - 5.11 MIL/uL   Hemoglobin 12.0 12.0 - 15.0 g/dL   HCT 37.9 36.0 - 46.0 %   MCV 87.5 78.0 - 100.0 fL   MCH 27.7 26.0 - 34.0 pg   MCHC 31.7 30.0 - 36.0 g/dL   RDW 16.2 (H) 11.5 - 15.5 %   Platelets 269 150 - 400 K/uL  Protime-INR     Status: None   Collection Time: 06/28/15  2:30 PM  Result Value Ref Range   Prothrombin Time 14.4 11.6 - 15.2 seconds   INR 1.10 0.00 - 1.49  Urinalysis, Routine w reflex microscopic (not at [facility])     Status: None   Collection Time: 06/28/15  2:30 PM  Result Value Ref Range   Color, Urine YELLOW YELLOW   APPearance CLEAR CLEAR   Specific Gravity, Urine 1.009 1.005 - 1.030   pH 5.5 5.0 - 8.0   Glucose, UA NEGATIVE NEGATIVE mg/dL   Hgb urine dipstick NEGATIVE NEGATIVE   Bilirubin Urine NEGATIVE NEGATIVE   Ketones, ur NEGATIVE NEGATIVE mg/dL   Protein, ur NEGATIVE NEGATIVE mg/dL   Nitrite NEGATIVE NEGATIVE   Leukocytes, UA NEGATIVE NEGATIVE    Comment: MICROSCOPIC NOT DONE ON URINES WITH NEGATIVE PROTEIN, BLOOD, LEUKOCYTES, NITRITE, OR GLUCOSE <1000 mg/dL.  Surgical pcr screen     Status: None   Collection Time: 06/28/15  2:52 PM  Result Value Ref Range   MRSA, PCR NEGATIVE NEGATIVE   Staphylococcus aureus NEGATIVE NEGATIVE    Comment:        The Xpert SA Assay (FDA approved for NASAL specimens in patients over 21 years of  age), is one component of a comprehensive surveillance program.  Test performance has been validated by [facility] for patients greater than or equal to 37 year old. It is not intended to diagnose infection nor to guide or monitor treatment.      Review of Systems  Constitutional: Negative.   HENT: Negative.   Eyes: Negative.   Respiratory: Negative.   Cardiovascular: Negative.   Gastrointestinal: Positive for constipation.  Genitourinary: Positive for frequency.  Musculoskeletal: Positive for back pain and joint pain.  Skin: Negative.   Neurological: Negative.   Endo/Heme/Allergies: Negative.   Psychiatric/Behavioral: Positive for depression. The patient is nervous/anxious.       Physical Exam  Constitutional: She is oriented to person, place, and time. She appears well-developed.  HENT:  Head: Normocephalic.  Eyes: Pupils are equal, round, and reactive to light.  Neck: Neck supple. No JVD present. No tracheal deviation present. No thyromegaly present.  Cardiovascular: Normal rate, regular rhythm, normal heart sounds and intact distal pulses.   Respiratory: Effort normal and breath sounds normal. No stridor. No respiratory distress. She has no wheezes.  GI: Soft. There is no tenderness. There is no guarding.  Musculoskeletal:       Left knee: She exhibits decreased range of motion, swelling, laceration (healed previous incision) and bony tenderness. She exhibits no ecchymosis, no deformity and no erythema. Tenderness found.  Lymphadenopathy:    She has no cervical adenopathy.  Neurological: She is alert and oriented to person, place, and time.  Skin: Skin is warm and dry.  Psychiatric: She has a normal mood and affect.       Assessment/Plan Assessment:   S/P resection of left TKA with placement of antibiotic spacer  Plan: Patient will undergo a reimplantation of left TKA with removal of antibiotic spacer on 07/05/2015 per Dr. Alvan Dame at [facility]. Risks  benefits and expectations were discussed with the patient. Patient understand risks, benefits and expectations and wishes to proceed.   [place] Pugh Alfreddie Consalvo   PA-C  06/29/2015, 8:55 PM

## 2015-07-05 ENCOUNTER — Inpatient Hospital Stay (HOSPITAL_COMMUNITY): Payer: Medicare Other | Admitting: Certified Registered"

## 2015-07-05 ENCOUNTER — Encounter (HOSPITAL_COMMUNITY)
Admission: RE | Disposition: A | Discharge status: Skilled Nursing Facility | Payer: Self-pay | Source: Ambulatory Visit | Attending: Orthopedic Surgery

## 2015-07-05 ENCOUNTER — Inpatient Hospital Stay (HOSPITAL_COMMUNITY)
Admission: RE | Admit: 2015-07-05 | Discharge: 2015-07-09 | DRG: 467 | Disposition: A | Discharge status: Skilled Nursing Facility | Payer: Medicare Other | Source: Ambulatory Visit | Attending: Orthopedic Surgery | Admitting: Orthopedic Surgery

## 2015-07-05 ENCOUNTER — Encounter (HOSPITAL_COMMUNITY): Payer: Self-pay

## 2015-07-05 DIAGNOSIS — Z7982 Long term (current) use of aspirin: Secondary | ICD-10-CM | POA: Diagnosis not present

## 2015-07-05 DIAGNOSIS — I4892 Unspecified atrial flutter: Secondary | ICD-10-CM | POA: Diagnosis not present

## 2015-07-05 DIAGNOSIS — H9190 Unspecified hearing loss, unspecified ear: Secondary | ICD-10-CM | POA: Diagnosis present

## 2015-07-05 DIAGNOSIS — Z96652 Presence of left artificial knee joint: Secondary | ICD-10-CM

## 2015-07-05 DIAGNOSIS — Z681 Body mass index (BMI) 19 or less, adult: Secondary | ICD-10-CM

## 2015-07-05 DIAGNOSIS — Z79899 Other long term (current) drug therapy: Secondary | ICD-10-CM | POA: Diagnosis not present

## 2015-07-05 DIAGNOSIS — Z96659 Presence of unspecified artificial knee joint: Secondary | ICD-10-CM

## 2015-07-05 DIAGNOSIS — M069 Rheumatoid arthritis, unspecified: Secondary | ICD-10-CM | POA: Diagnosis present

## 2015-07-05 DIAGNOSIS — R000 Tachycardia, unspecified: Secondary | ICD-10-CM | POA: Diagnosis present

## 2015-07-05 DIAGNOSIS — I739 Peripheral vascular disease, unspecified: Secondary | ICD-10-CM | POA: Diagnosis present

## 2015-07-05 DIAGNOSIS — Y838 Other surgical procedures as the cause of abnormal reaction of the patient, or of later complication, without mention of misadventure at the time of the procedure: Secondary | ICD-10-CM | POA: Diagnosis present

## 2015-07-05 DIAGNOSIS — F17210 Nicotine dependence, cigarettes, uncomplicated: Secondary | ICD-10-CM | POA: Diagnosis present

## 2015-07-05 DIAGNOSIS — Z981 Arthrodesis status: Secondary | ICD-10-CM

## 2015-07-05 DIAGNOSIS — E440 Moderate protein-calorie malnutrition: Secondary | ICD-10-CM | POA: Insufficient documentation

## 2015-07-05 DIAGNOSIS — E039 Hypothyroidism, unspecified: Secondary | ICD-10-CM | POA: Diagnosis present

## 2015-07-05 DIAGNOSIS — I10 Essential (primary) hypertension: Secondary | ICD-10-CM | POA: Diagnosis present

## 2015-07-05 DIAGNOSIS — M62838 Other muscle spasm: Secondary | ICD-10-CM

## 2015-07-05 DIAGNOSIS — I4819 Other persistent atrial fibrillation: Secondary | ICD-10-CM | POA: Insufficient documentation

## 2015-07-05 DIAGNOSIS — T8454XA Infection and inflammatory reaction due to internal left knee prosthesis, initial encounter: Secondary | ICD-10-CM | POA: Diagnosis present

## 2015-07-05 DIAGNOSIS — R7982 Elevated C-reactive protein (CRP): Secondary | ICD-10-CM | POA: Diagnosis not present

## 2015-07-05 DIAGNOSIS — M810 Age-related osteoporosis without current pathological fracture: Secondary | ICD-10-CM | POA: Diagnosis present

## 2015-07-05 DIAGNOSIS — Z7901 Long term (current) use of anticoagulants: Secondary | ICD-10-CM | POA: Diagnosis not present

## 2015-07-05 DIAGNOSIS — I481 Persistent atrial fibrillation: Secondary | ICD-10-CM | POA: Diagnosis present

## 2015-07-05 DIAGNOSIS — D72829 Elevated white blood cell count, unspecified: Secondary | ICD-10-CM | POA: Diagnosis not present

## 2015-07-05 HISTORY — PX: REIMPLANTATION OF TOTAL KNEE: SHX6052

## 2015-07-05 LAB — SYNOVIAL CELL COUNT + DIFF, W/ CRYSTALS
Crystals, Fluid: NONE SEEN
EOSINOPHILS-SYNOVIAL: 0 % (ref 0–1)
LYMPHOCYTES-SYNOVIAL FLD: 6 % (ref 0–20)
MONOCYTE-MACROPHAGE-SYNOVIAL FLUID: 4 % — AB (ref 50–90)
Neutrophil, Synovial: 90 % — ABNORMAL HIGH (ref 0–25)
WBC, Synovial: 28380 /mm3 — ABNORMAL HIGH (ref 0–200)

## 2015-07-05 LAB — TYPE AND SCREEN
ABO/RH(D): B POS
ANTIBODY SCREEN: NEGATIVE

## 2015-07-05 LAB — SEDIMENTATION RATE: SED RATE: 30 mm/h — AB (ref 0–22)

## 2015-07-05 LAB — C-REACTIVE PROTEIN: CRP: 4.5 mg/dL — ABNORMAL HIGH (ref <1.0)

## 2015-07-05 SURGERY — REVISION, TOTAL ARTHROPLASTY, KNEE
Anesthesia: General | Site: Knee | Laterality: Left

## 2015-07-05 MED ORDER — FUROSEMIDE 40 MG PO TABS: 40.0000 mg | ORAL_TABLET | Freq: Every day | ORAL | Status: DC

## 2015-07-05 MED ORDER — METOCLOPRAMIDE HCL 10 MG PO TABS
5.0000 mg | ORAL_TABLET | Freq: Three times a day (TID) | ORAL | Status: DC | PRN
Start: 2015-07-05 — End: 2015-07-09

## 2015-07-05 MED ORDER — DEXAMETHASONE SODIUM PHOSPHATE 10 MG/ML IJ SOLN
10.0000 mg | Freq: Once | INTRAMUSCULAR | Status: AC
  Administered 2015-07-05: 10 mg via INTRAVENOUS

## 2015-07-05 MED ORDER — PREDNISONE 1 MG PO TABS
4.0000 mg | ORAL_TABLET | Freq: Every day | ORAL | Status: DC
  Administered 2015-07-06 – 2015-07-09 (×4): 4 mg via ORAL
  Filled 2015-07-05 (×5): qty 4

## 2015-07-05 MED ORDER — PROPOFOL 10 MG/ML IV BOLUS
INTRAVENOUS | Status: AC
  Filled 2015-07-05: qty 20

## 2015-07-05 MED ORDER — MEPERIDINE HCL 50 MG/ML IJ SOLN: 6.2500 mg | INTRAMUSCULAR | Status: DC | PRN

## 2015-07-05 MED ORDER — BALSALAZIDE DISODIUM 750 MG PO CAPS
2250.0000 mg | ORAL_CAPSULE | Freq: Two times a day (BID) | ORAL | Status: DC
  Administered 2015-07-05 – 2015-07-09 (×8): 2250 mg via ORAL
  Filled 2015-07-05 (×10): qty 3

## 2015-07-05 MED ORDER — SUGAMMADEX SODIUM 200 MG/2ML IV SOLN
INTRAVENOUS | Status: AC
  Filled 2015-07-05: qty 2

## 2015-07-05 MED ORDER — PHENYLEPHRINE 40 MCG/ML (10ML) SYRINGE FOR IV PUSH (FOR BLOOD PRESSURE SUPPORT)
PREFILLED_SYRINGE | INTRAVENOUS | Status: AC
  Filled 2015-07-05: qty 20

## 2015-07-05 MED ORDER — BUPIVACAINE-EPINEPHRINE (PF) 0.25% -1:200000 IJ SOLN
INTRAMUSCULAR | Status: AC
  Filled 2015-07-05: qty 30

## 2015-07-05 MED ORDER — MAGNESIUM CITRATE PO SOLN: 1.0000 | Freq: Once | ORAL | Status: DC | PRN

## 2015-07-05 MED ORDER — MEDROXYPROGESTERONE ACETATE 2.5 MG PO TABS
2.5000 mg | ORAL_TABLET | Freq: Every day | ORAL | Status: DC
  Administered 2015-07-05 – 2015-07-08 (×4): 2.5 mg via ORAL
  Filled 2015-07-05 (×5): qty 1

## 2015-07-05 MED ORDER — FENTANYL CITRATE (PF) 100 MCG/2ML IJ SOLN
INTRAMUSCULAR | Status: AC
  Filled 2015-07-05: qty 2

## 2015-07-05 MED ORDER — FENTANYL CITRATE (PF) 100 MCG/2ML IJ SOLN
INTRAMUSCULAR | Status: DC | PRN
  Administered 2015-07-05 (×7): 25 ug via INTRAVENOUS
  Administered 2015-07-05: 50 ug via INTRAVENOUS
  Administered 2015-07-05: 25 ug via INTRAVENOUS
  Administered 2015-07-05: 50 ug via INTRAVENOUS

## 2015-07-05 MED ORDER — DOXYCYCLINE HYCLATE 100 MG PO TABS
100.0000 mg | ORAL_TABLET | Freq: Two times a day (BID) | ORAL | Status: DC
  Administered 2015-07-05 – 2015-07-09 (×8): 100 mg via ORAL
  Filled 2015-07-05 (×8): qty 1

## 2015-07-05 MED ORDER — ONDANSETRON HCL 4 MG PO TABS: 4.0000 mg | ORAL_TABLET | Freq: Four times a day (QID) | ORAL | Status: DC | PRN

## 2015-07-05 MED ORDER — DOCUSATE SODIUM 100 MG PO CAPS: 100.0000 mg | ORAL_CAPSULE | Freq: Two times a day (BID) | ORAL | Status: DC | PRN

## 2015-07-05 MED ORDER — SODIUM CHLORIDE 0.9 % IJ SOLN
INTRAMUSCULAR | Status: AC
  Filled 2015-07-05: qty 50

## 2015-07-05 MED ORDER — METOCLOPRAMIDE HCL 5 MG/ML IJ SOLN: 5.0000 mg | Freq: Three times a day (TID) | INTRAMUSCULAR | Status: DC | PRN

## 2015-07-05 MED ORDER — SODIUM CHLORIDE 0.9 % IR SOLN
Status: DC | PRN
  Administered 2015-07-05: 3000 mL

## 2015-07-05 MED ORDER — METHOCARBAMOL 500 MG PO TABS
500.0000 mg | ORAL_TABLET | Freq: Four times a day (QID) | ORAL | Status: DC | PRN
  Administered 2015-07-06 (×2): 500 mg via ORAL
  Filled 2015-07-05 (×2): qty 1

## 2015-07-05 MED ORDER — SPIRONOLACTONE 25 MG PO TABS
25.0000 mg | ORAL_TABLET | Freq: Every day | ORAL | Status: DC
  Administered 2015-07-05 – 2015-07-09 (×5): 25 mg via ORAL
  Filled 2015-07-05 (×5): qty 1

## 2015-07-05 MED ORDER — ONDANSETRON HCL 4 MG/2ML IJ SOLN
INTRAMUSCULAR | Status: DC | PRN
  Administered 2015-07-05: 4 mg via INTRAVENOUS

## 2015-07-05 MED ORDER — BUPIVACAINE-EPINEPHRINE (PF) 0.25% -1:200000 IJ SOLN
INTRAMUSCULAR | Status: DC | PRN
  Administered 2015-07-05: 30 mL via PERINEURAL

## 2015-07-05 MED ORDER — PHENOL 1.4 % MT LIQD
1.0000 | OROMUCOSAL | Status: DC | PRN
  Filled 2015-07-05: qty 177

## 2015-07-05 MED ORDER — BOOST / RESOURCE BREEZE PO LIQD
1.0000 | Freq: Three times a day (TID) | ORAL | Status: DC
  Administered 2015-07-05 – 2015-07-07 (×4): 1 via ORAL

## 2015-07-05 MED ORDER — HYDROMORPHONE HCL 1 MG/ML IJ SOLN
INTRAMUSCULAR | Status: AC
Start: 2015-07-05 — End: 2015-07-05
  Administered 2015-07-05: 0.5 mg via INTRAVENOUS
  Filled 2015-07-05: qty 1

## 2015-07-05 MED ORDER — DEXAMETHASONE SODIUM PHOSPHATE 10 MG/ML IJ SOLN
INTRAMUSCULAR | Status: AC
  Filled 2015-07-05: qty 1

## 2015-07-05 MED ORDER — DEXAMETHASONE SODIUM PHOSPHATE 10 MG/ML IJ SOLN
10.0000 mg | Freq: Once | INTRAMUSCULAR | Status: AC
Start: 2015-07-06 — End: 2015-07-06
  Administered 2015-07-06: 10 mg via INTRAVENOUS
  Filled 2015-07-05: qty 1

## 2015-07-05 MED ORDER — KETOROLAC TROMETHAMINE 30 MG/ML IJ SOLN
INTRAMUSCULAR | Status: AC
  Filled 2015-07-05: qty 1

## 2015-07-05 MED ORDER — HYDROMORPHONE HCL 1 MG/ML IJ SOLN
INTRAMUSCULAR | Status: AC
  Administered 2015-07-05: 0.5 mg via INTRAVENOUS
  Filled 2015-07-05: qty 1

## 2015-07-05 MED ORDER — ALUM & MAG HYDROXIDE-SIMETH 200-200-20 MG/5ML PO SUSP
30.0000 mL | ORAL | Status: DC | PRN
Start: 2015-07-05 — End: 2015-07-09

## 2015-07-05 MED ORDER — SUGAMMADEX SODIUM 200 MG/2ML IV SOLN
INTRAVENOUS | Status: DC | PRN
  Administered 2015-07-05: 200 mg via INTRAVENOUS

## 2015-07-05 MED ORDER — DIPHENHYDRAMINE HCL 25 MG PO CAPS: 25.0000 mg | ORAL_CAPSULE | Freq: Four times a day (QID) | ORAL | Status: DC | PRN

## 2015-07-05 MED ORDER — LACTATED RINGERS IV SOLN
INTRAVENOUS | Status: DC | PRN
  Administered 2015-07-05 (×2): via INTRAVENOUS

## 2015-07-05 MED ORDER — METOPROLOL SUCCINATE ER 25 MG PO TB24: 25.0000 mg | ORAL_TABLET | Freq: Two times a day (BID) | ORAL | Status: DC

## 2015-07-05 MED ORDER — MINERAL OIL LIGHT 100 % EX OIL
TOPICAL_OIL | CUTANEOUS | Status: AC
  Filled 2015-07-05: qty 25

## 2015-07-05 MED ORDER — PHENYLEPHRINE 40 MCG/ML (10ML) SYRINGE FOR IV PUSH (FOR BLOOD PRESSURE SUPPORT)
PREFILLED_SYRINGE | INTRAVENOUS | Status: AC
  Filled 2015-07-05: qty 10

## 2015-07-05 MED ORDER — ONDANSETRON HCL 4 MG/2ML IJ SOLN: 4.0000 mg | Freq: Once | INTRAMUSCULAR | Status: DC | PRN

## 2015-07-05 MED ORDER — METOPROLOL SUCCINATE ER 25 MG PO TB24
25.0000 mg | ORAL_TABLET | Freq: Two times a day (BID) | ORAL | Status: DC
  Administered 2015-07-05 – 2015-07-09 (×8): 25 mg via ORAL
  Filled 2015-07-05 (×10): qty 1

## 2015-07-05 MED ORDER — FUROSEMIDE 40 MG PO TABS
40.0000 mg | ORAL_TABLET | Freq: Every day | ORAL | Status: DC
  Administered 2015-07-06 – 2015-07-09 (×4): 40 mg via ORAL
  Filled 2015-07-05 (×4): qty 1

## 2015-07-05 MED ORDER — POLYETHYLENE GLYCOL 3350 17 G PO PACK: 17.0000 g | PACK | Freq: Two times a day (BID) | ORAL | Status: DC | PRN

## 2015-07-05 MED ORDER — MIDAZOLAM HCL 2 MG/2ML IJ SOLN
INTRAMUSCULAR | Status: AC
  Filled 2015-07-05: qty 2

## 2015-07-05 MED ORDER — CEFAZOLIN SODIUM-DEXTROSE 2-4 GM/100ML-% IV SOLN
2.0000 g | Freq: Three times a day (TID) | INTRAVENOUS | Status: AC
  Administered 2015-07-05 – 2015-07-06 (×2): 2 g via INTRAVENOUS
  Filled 2015-07-05 (×4): qty 100

## 2015-07-05 MED ORDER — LIDOCAINE HCL (CARDIAC) 20 MG/ML IV SOLN
INTRAVENOUS | Status: DC | PRN
  Administered 2015-07-05: 60 mg via INTRAVENOUS

## 2015-07-05 MED ORDER — ASPIRIN EC 325 MG PO TBEC
325.0000 mg | DELAYED_RELEASE_TABLET | Freq: Two times a day (BID) | ORAL | Status: DC
  Administered 2015-07-06 – 2015-07-07 (×3): 325 mg via ORAL
  Filled 2015-07-05 (×3): qty 1

## 2015-07-05 MED ORDER — ONDANSETRON HCL 4 MG/2ML IJ SOLN: 4.0000 mg | Freq: Four times a day (QID) | INTRAMUSCULAR | Status: DC | PRN

## 2015-07-05 MED ORDER — HYDROMORPHONE HCL 1 MG/ML IJ SOLN
0.2500 mg | INTRAMUSCULAR | Status: DC | PRN
  Administered 2015-07-05 (×4): 0.5 mg via INTRAVENOUS

## 2015-07-05 MED ORDER — METOPROLOL SUCCINATE ER 25 MG PO TB24
25.0000 mg | ORAL_TABLET | Freq: Two times a day (BID) | ORAL | Status: DC
  Filled 2015-07-05: qty 1

## 2015-07-05 MED ORDER — BISACODYL 10 MG RE SUPP: 10.0000 mg | Freq: Every day | RECTAL | Status: DC | PRN

## 2015-07-05 MED ORDER — TRANEXAMIC ACID 1000 MG/10ML IV SOLN
1000.0000 mg | Freq: Once | INTRAVENOUS | Status: AC
  Administered 2015-07-05: 1000 mg via INTRAVENOUS
  Filled 2015-07-05: qty 10

## 2015-07-05 MED ORDER — DILTIAZEM HCL 100 MG IV SOLR
5.0000 mg/h | INTRAVENOUS | Status: DC
  Administered 2015-07-05: 5 mg/h via INTRAVENOUS
  Filled 2015-07-05: qty 100

## 2015-07-05 MED ORDER — PROPOFOL 10 MG/ML IV BOLUS
INTRAVENOUS | Status: DC | PRN
  Administered 2015-07-05: 150 mg via INTRAVENOUS
  Administered 2015-07-05: 50 mg via INTRAVENOUS

## 2015-07-05 MED ORDER — CEFAZOLIN SODIUM-DEXTROSE 2-4 GM/100ML-% IV SOLN
2.0000 g | INTRAVENOUS | Status: AC
  Administered 2015-07-05: 2 g via INTRAVENOUS

## 2015-07-05 MED ORDER — FERROUS SULFATE 325 (65 FE) MG PO TABS
325.0000 mg | ORAL_TABLET | Freq: Three times a day (TID) | ORAL | Status: DC
  Administered 2015-07-06 – 2015-07-07 (×5): 325 mg via ORAL
  Filled 2015-07-05 (×5): qty 1

## 2015-07-05 MED ORDER — LIDOCAINE HCL (CARDIAC) 20 MG/ML IV SOLN
INTRAVENOUS | Status: AC
  Filled 2015-07-05: qty 5

## 2015-07-05 MED ORDER — TRANEXAMIC ACID 1000 MG/10ML IV SOLN
1000.0000 mg | Freq: Once | INTRAVENOUS | Status: AC
  Administered 2015-07-05: 14:00:00 via INTRAVENOUS
  Administered 2015-07-05: 1000 mg via INTRAVENOUS
  Filled 2015-07-05: qty 10

## 2015-07-05 MED ORDER — MENTHOL 3 MG MT LOZG
1.0000 | LOZENGE | OROMUCOSAL | Status: DC | PRN
  Administered 2015-07-06: 1 via ORAL
  Administered 2015-07-09: 3 mg via ORAL
  Filled 2015-07-05: qty 9

## 2015-07-05 MED ORDER — LEVOTHYROXINE SODIUM 88 MCG PO TABS
88.0000 ug | ORAL_TABLET | Freq: Every day | ORAL | Status: DC
  Administered 2015-07-06 – 2015-07-09 (×4): 88 ug via ORAL
  Filled 2015-07-05 (×4): qty 1

## 2015-07-05 MED ORDER — KETOROLAC TROMETHAMINE 30 MG/ML IJ SOLN
INTRAMUSCULAR | Status: DC | PRN
  Administered 2015-07-05: 30 mg via INTRAVENOUS

## 2015-07-05 MED ORDER — OXYCODONE HCL 5 MG PO TABS
10.0000 mg | ORAL_TABLET | ORAL | Status: DC
  Administered 2015-07-05 – 2015-07-09 (×22): 10 mg via ORAL
  Filled 2015-07-05 (×4): qty 2
  Filled 2015-07-05: qty 4
  Filled 2015-07-05 (×17): qty 2

## 2015-07-05 MED ORDER — DIAZEPAM 5 MG PO TABS
5.0000 mg | ORAL_TABLET | Freq: Four times a day (QID) | ORAL | Status: DC | PRN
  Administered 2015-07-05 – 2015-07-06 (×3): 5 mg via ORAL
  Filled 2015-07-05 (×3): qty 1

## 2015-07-05 MED ORDER — ROPIVACAINE HCL 5 MG/ML IJ SOLN
INTRAMUSCULAR | Status: AC
  Filled 2015-07-05: qty 30

## 2015-07-05 MED ORDER — ROCURONIUM BROMIDE 100 MG/10ML IV SOLN
INTRAVENOUS | Status: DC | PRN
  Administered 2015-07-05: 50 mg via INTRAVENOUS

## 2015-07-05 MED ORDER — VANCOMYCIN HCL 1000 MG IV SOLR
1000.0000 mg | Freq: Two times a day (BID) | INTRAVENOUS | Status: DC
  Administered 2015-07-05: 1000 mg via INTRAVENOUS
  Filled 2015-07-05 (×3): qty 1000

## 2015-07-05 MED ORDER — CELECOXIB 200 MG PO CAPS
200.0000 mg | ORAL_CAPSULE | Freq: Two times a day (BID) | ORAL | Status: DC
  Administered 2015-07-05 – 2015-07-09 (×8): 200 mg via ORAL
  Filled 2015-07-05 (×10): qty 1

## 2015-07-05 MED ORDER — VANCOMYCIN HCL IN DEXTROSE 1-5 GM/200ML-% IV SOLN
INTRAVENOUS | Status: AC
  Filled 2015-07-05: qty 200

## 2015-07-05 MED ORDER — LATANOPROST 0.005 % OP SOLN
1.0000 [drp] | Freq: Every day | OPHTHALMIC | Status: DC
  Administered 2015-07-05 – 2015-07-08 (×4): 1 [drp] via OPHTHALMIC
  Filled 2015-07-05: qty 2.5

## 2015-07-05 MED ORDER — PHENYLEPHRINE HCL 10 MG/ML IJ SOLN
INTRAMUSCULAR | Status: DC | PRN
  Administered 2015-07-05: 120 ug via INTRAVENOUS
  Administered 2015-07-05: 40 ug via INTRAVENOUS
  Administered 2015-07-05 (×2): 80 ug via INTRAVENOUS
  Administered 2015-07-05 (×2): 160 ug via INTRAVENOUS
  Administered 2015-07-05: 40 ug via INTRAVENOUS
  Administered 2015-07-05: 120 ug via INTRAVENOUS
  Administered 2015-07-05: 40 ug via INTRAVENOUS

## 2015-07-05 MED ORDER — ONDANSETRON HCL 4 MG/2ML IJ SOLN
INTRAMUSCULAR | Status: AC
  Filled 2015-07-05: qty 2

## 2015-07-05 MED ORDER — POTASSIUM CHLORIDE CRYS ER 20 MEQ PO TBCR
40.0000 meq | EXTENDED_RELEASE_TABLET | Freq: Every day | ORAL | Status: DC
  Administered 2015-07-06 – 2015-07-09 (×4): 40 meq via ORAL
  Filled 2015-07-05 (×4): qty 2

## 2015-07-05 MED ORDER — METHOCARBAMOL 1000 MG/10ML IJ SOLN
500.0000 mg | Freq: Four times a day (QID) | INTRAVENOUS | Status: DC | PRN
  Administered 2015-07-05: 500 mg via INTRAVENOUS
  Filled 2015-07-05 (×2): qty 5

## 2015-07-05 MED ORDER — SODIUM CHLORIDE 0.9 % IV SOLN
INTRAVENOUS | Status: DC
  Administered 2015-07-05 – 2015-07-07 (×2): via INTRAVENOUS

## 2015-07-05 MED ORDER — CEFAZOLIN SODIUM-DEXTROSE 2-4 GM/100ML-% IV SOLN
INTRAVENOUS | Status: AC
  Filled 2015-07-05: qty 100

## 2015-07-05 MED ORDER — HYDROMORPHONE HCL 1 MG/ML IJ SOLN: 0.5000 mg | INTRAMUSCULAR | Status: DC | PRN

## 2015-07-05 SURGICAL SUPPLY — 80 items
ADAPTER BOLT FEMORAL +2/-2 (Knees) ×2 IMPLANT
ADPR FEM +2/-2 OFST BOLT (Knees) ×1 IMPLANT
ADPR FEM 5D STRL KN PFC SGM (Orthopedic Implant) ×1 IMPLANT
AUG FEM SZ3 4 CMB POST STRL LF (Knees) ×1 IMPLANT
AUG FEM SZ3 8 CMB POST STRL LF (Knees) ×1 IMPLANT
AUG FEM SZ3 8 DIST STRL KN LT (Knees) ×2 IMPLANT
AUGMENT DIST PFC 8MM (Knees) IMPLANT
BAG SPEC THK2 15X12 ZIP CLS (MISCELLANEOUS) ×1
BAG ZIPLOCK 12X15 (MISCELLANEOUS) ×3 IMPLANT
BANDAGE ACE 6X5 VEL STRL LF (GAUZE/BANDAGES/DRESSINGS) ×3 IMPLANT
BANDAGE ESMARK 6X9 LF (GAUZE/BANDAGES/DRESSINGS) ×1 IMPLANT
BLADE SAW SGTL 13.0X1.19X90.0M (BLADE) ×3 IMPLANT
BLADE SAW SGTL 81X20 HD (BLADE) ×3 IMPLANT
BNDG CMPR 9X6 STRL LF SNTH (GAUZE/BANDAGES/DRESSINGS) ×1
BNDG ESMARK 6X9 LF (GAUZE/BANDAGES/DRESSINGS) ×3
BONE CEMENT GENTAMICIN (Cement) ×9 IMPLANT
BRUSH FEMORAL CANAL (MISCELLANEOUS) ×2 IMPLANT
CEMENT BONE GENTAMICIN 40 (Cement) ×3 IMPLANT
CEMENT RESTRICTOR DEPUY SZ 6 (Cement) ×2 IMPLANT
CUFF TOURN SGL QUICK 34 (TOURNIQUET CUFF) ×3
CUFF TRNQT CYL 34X4X40X1 (TOURNIQUET CUFF) ×1 IMPLANT
DISAL AUG PFC 8MM (Knees) ×6 IMPLANT
DRAPE EXTREMITY T 121X128X90 (DRAPE) ×3 IMPLANT
DRAPE POUCH INSTRU U-SHP 10X18 (DRAPES) ×3 IMPLANT
DRAPE U-SHAPE 47X51 STRL (DRAPES) ×3 IMPLANT
DRESSING AQUACEL AG SP 3.5X10 (GAUZE/BANDAGES/DRESSINGS) IMPLANT
DRSG AQUACEL AG ADV 3.5X14 (GAUZE/BANDAGES/DRESSINGS) ×2 IMPLANT
DRSG AQUACEL AG SP 3.5X10 (GAUZE/BANDAGES/DRESSINGS)
DRSG PAD ABDOMINAL 8X10 ST (GAUZE/BANDAGES/DRESSINGS) ×3 IMPLANT
DURAPREP 26ML APPLICATOR (WOUND CARE) ×3 IMPLANT
ELECT REM PT RETURN 9FT ADLT (ELECTROSURGICAL) ×3
ELECTRODE REM PT RTRN 9FT ADLT (ELECTROSURGICAL) ×1 IMPLANT
FACESHIELD WRAPAROUND (MASK) ×15 IMPLANT
FACESHIELD WRAPAROUND OR TEAM (MASK) ×5 IMPLANT
FEM TC3 PFC SZ3 LEFT (Orthopedic Implant) ×3 IMPLANT
FEMORAL ADAPTER (Orthopedic Implant) ×2 IMPLANT
FEMORAL TC3 PFC SZ3 LEFT (Orthopedic Implant) IMPLANT
FEMUR LFT TC3 REVISION (Knees) ×2 IMPLANT
GAUZE SPONGE 4X4 12PLY STRL (GAUZE/BANDAGES/DRESSINGS) ×6 IMPLANT
GAUZE XEROFORM 5X9 LF (GAUZE/BANDAGES/DRESSINGS) ×3 IMPLANT
GLOVE BIOGEL M 7.0 STRL (GLOVE) IMPLANT
GLOVE BIOGEL PI IND STRL 7.5 (GLOVE) ×1 IMPLANT
GLOVE BIOGEL PI IND STRL 8.5 (GLOVE) ×1 IMPLANT
GLOVE BIOGEL PI INDICATOR 7.5 (GLOVE) ×2
GLOVE BIOGEL PI INDICATOR 8.5 (GLOVE) ×2
GLOVE ECLIPSE 8.0 STRL XLNG CF (GLOVE) ×3 IMPLANT
GLOVE ORTHO TXT STRL SZ7.5 (GLOVE) ×6 IMPLANT
GOWN STRL REUS W/TWL LRG LVL3 (GOWN DISPOSABLE) ×3 IMPLANT
GOWN STRL REUS W/TWL XL LVL3 (GOWN DISPOSABLE) ×3 IMPLANT
HANDPIECE INTERPULSE COAX TIP (DISPOSABLE) ×3
IMMOBILIZER KNEE 20 (SOFTGOODS)
IMMOBILIZER KNEE 20 THIGH 36 (SOFTGOODS) IMPLANT
INSERT TIBIAL PFC 3 17.5 (Knees) ×2 IMPLANT
MANIFOLD NEPTUNE II (INSTRUMENTS) ×3 IMPLANT
NDL SAFETY ECLIPSE 18X1.5 (NEEDLE) ×1 IMPLANT
NEEDLE HYPO 18GX1.5 SHARP (NEEDLE) ×3
NS IRRIG 1000ML POUR BTL (IV SOLUTION) ×3 IMPLANT
PADDING CAST COTTON 6X4 STRL (CAST SUPPLIES) ×6 IMPLANT
PATELLA DOME PFC 38MM (Knees) ×2 IMPLANT
POSITIONER SURGICAL ARM (MISCELLANEOUS) ×3 IMPLANT
POST AVE PFC 4MM (Knees) ×2 IMPLANT
POST AVE PFC 8MM (Knees) ×2 IMPLANT
SET HNDPC FAN SPRY TIP SCT (DISPOSABLE) ×1 IMPLANT
SET PAD KNEE POSITIONER (MISCELLANEOUS) ×3 IMPLANT
SPONGE LAP 18X18 X RAY DECT (DISPOSABLE) ×1 IMPLANT
STAPLER VISISTAT 35W (STAPLE) IMPLANT
STEM TIBIA PFC 13X30MM (Stem) ×2 IMPLANT
SUCTION FRAZIER HANDLE 12FR (TUBING) ×2
SUCTION TUBE FRAZIER 12FR DISP (TUBING) ×1 IMPLANT
SUT VIC AB 1 CT1 36 (SUTURE) ×9 IMPLANT
SUT VIC AB 2-0 CT1 27 (SUTURE) ×9
SUT VIC AB 2-0 CT1 TAPERPNT 27 (SUTURE) ×3 IMPLANT
SYR 50ML LL SCALE MARK (SYRINGE) ×3 IMPLANT
TOWEL OR 17X26 10 PK STRL BLUE (TOWEL DISPOSABLE) ×3 IMPLANT
TOWER CARTRIDGE SMART MIX (DISPOSABLE) ×3 IMPLANT
TRAY FOLEY W/METER SILVER 14FR (SET/KITS/TRAYS/PACK) ×3 IMPLANT
TRAY FOLEY W/METER SILVER 16FR (SET/KITS/TRAYS/PACK) ×1 IMPLANT
WATER STERILE IRR 1500ML POUR (IV SOLUTION) ×6 IMPLANT
WRAP KNEE MAXI GEL POST OP (GAUZE/BANDAGES/DRESSINGS) ×3 IMPLANT
YANKAUER SUCT BULB TIP 10FT TU (MISCELLANEOUS) ×2 IMPLANT

## 2015-07-05 NOTE — Progress Notes (Signed)
AssistedDr. Ossey with left, ultrasound guided, adductor canal block. Side rails up, monitors on throughout procedure. See vital signs in flow sheet. Tolerated Procedure well.  

## 2015-07-05 NOTE — Op Note (Signed)
NAMEMIRHA, VARONE NO.:  [id number]  MEDICAL RECORD NO.:  [id number]  LOCATION:  [facility]                         FACILITY:  [facility]  PHYSICIAN:  Pietro Cassis. Alvan Dame, M.D.  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  PROCEDURES: 1. Removal of antibiotic spacer. 2. Reimplantation/revision of left total knee.  COMPONENTS USED:  A DePuy size 3 TC3 femur with a 13 x 30 cemented stem, +2 bolt, 5-degree adapter with 8-mm distal, medial and lateral augments, 8-mm posterior and medial augment, and a 4-mm posterolateral augment and then a 17.5-mm posterior stabilized insert to match the 3 femur.  SURGEON:  Pietro Cassis. Alvan Dame, M.D.  ASSISTANT:  Danae Orleans, PA-C.  Note that Mr. [name] was present for the entirety of the case from preoperative position, perioperative management of the operative extremity and general facilitation of the case and primary wound closure.  ANESTHESIA:  General plus a preoperative regional adductor canal block.  DRAINS:  None.  COMPLICATIONS:  None.  TOURNIQUET TIME:  51 minutes at 250 mmHg.  SPECIMENS:  I did send approximately 20 mL of synovial fluid as well as swabs for Gram stain and culture, cell count evaluation.  INDICATIONS FOR PROCEDURE:  Ms. Blumenberg is a pleasant 81 year old female who unfortunately battling rheumatoid arthritis for long term.  She was on immunosuppressive medications.  In her postoperative period from her primary total knee arthroplasty, she developed significant lower extremity edematous changes that ultimately  led to skin eruption, significant cellulitis and most likely contributing to the concern for infection on her left knee.  At the time of her attempted explant to manage her infection of her left knee after failing of previous I and D, I was unable to remove the tibial component, as when I was doing so, there was evidence of proximal tibial cracking and I do not feel that it was worth the morbidity of a significantly-altered proximal tibia as opposed to leaving the component placed.  I subsequently placed the femoral cement spacer, she has been on antibiotics.  In the postoperative period, there has been a significant delay in getting her back to the OR related to antibiotic use as well as her lower extremity edematous changes again in this recurrent cellulitis that occurred again in this postoperative.  We continue to monitor her labs, her sedimentation rate, C-reactive protein, that is trended towards normal. She had significant improvement on her leg cellulitis.  At this point, we determined that surgery would be indicated for at least a repeat I and D, possible placement of antibiotic spacer versus reimplantation of her total knee.  Risks and benefits were discussed and reviewed with particular concern regarding the infection concerns.  PROCEDURE IN DETAIL:  The patient was brought into the operative theater.  Once adequate anesthesia, preop antibiotics initially held until  an arthrotomy made, we then administered 2 g of Ancef.  She was positioned supine.  A proximal thigh tourniquet was placed on the left. The left lower extremity was then prepped and draped in sterile fashion with the left leg placed in [facility], [facility] leg holder.  The leg was exsanguinated.  Tourniquet elevated to 250 mmHg.  A midline incision was made excising her old scar, soft tissue planes created.  Median arthrotomy was made encountering a bloody synovial fluid.  We identified at the time that the polyethylene had  rotated around her knee indicating some laxity in her ligaments.  With the knee flexed, the femoral cemented spacer was removed as was any other remaining cement fragments.  Significant medial and lateral synovectomies were carried out as well as posterior in the suprapatellar region.  The old polyethylene was removed.  Once I had adequately debrided the knee of all soft tissues including any pseudosynovial tissue of the bone surfaces, I irrigated the knee, at this point, about 1500 mL normal of saline solution pulse lavaged.  Following this, I opened up the canal with reamers and reamed up to 15 mm to allow for 13 mm cemented stem.  I then prepared the distal aspect of the femur again, placed in an intramedullary rod with the 16-mm aperture to centralize the rod, revisited the distal femoral cut, noting that no asymmetric augments and only would be utilized to improve the balance between the flexion and extension gaps.  I then revisited with a size 3 femoral cutting block.  This will be taken out of her knee initially and found that during this time from her last surgery of removal of the implant, now that, we required 8-mm augment medially, posteriorly and 4 mm posterolateral.  I then revisited the cut for the box.  At this point, I did a trial reduction with a size 3 femoral component, the aforementioned augments, the 13 x 30-mm stem, +2 bolt and 5-degree adapter.  I did add 4-mm distal augments initially.  At this point, I did a trial reduction and even up to a 17.5-mm insert, there was slight bit of hyperextension.  For this reason, I altered the final components as noted above.  At this point, all trial components were removed and we fit the distal femur with a cement restrictor.  The final components were opened on the back field including the 8-mm distal, medial and lateral augments.  The canal was irrigated with normal saline solution with a pulse lavage canal brush  irrigator.  Once the distal femur was prepared and dried and the final components were opened and configured on the back table, I mixed cement.  We used gentamicin impregnated cement.  The final femoral component was then cemented into place.  The knee was brought to extension with a 17.5 insert, come into full extension.  It was held in extension until the cement fully cured, excessive cement was removed throughout the knee.  Based on the trial reductions and instability, the final 17.5-mm poly was placed.  Please note that, in addition, the patella was revisited again, removing all pseudosynovial tissue over top of the patella, I recut the patella only 1 mm to have a fresh bone cut surface.  I sized the patella to be a size 38, 38 drill holes were drilled and the final 38 button was cemented into place at the time of femoral cementing.  The knee was re-irrigated with normal saline solution for total of 3 liters.  My overall  impression of the knee was that there was no evidence of any signs of obvious infection.  However, at this point, based on her rheumatoid arthritis, based on her history of immunocompromised medication as well as the significant lower extremity cellulitis and edematous changes as well as wound issues addressed, I may very well like to despite the results of any aspiration in the OR today to keep her on IV antibiotics for 4-6 weeks based on feasibility and have her remain on suppressive oral antibiotics for up to 6 months.  From a social standpoint, Ms. Cilento based on her limited limitations with her underlying rheumatoid arthritis for her to have another significant morbid operation as above may very well cause a significant amount of harm to her.     Pietro Cassis Alvan Dame, M.D.     MDO/MEDQ  D:  07/05/2015  T:  07/05/2015  Job:  [id number]

## 2015-07-05 NOTE — Progress Notes (Signed)
Patient had applesauce w meds about 0750 this am. Med list obtained by Claiborne Billings LPN, at [facility]. [place] in [place] notified

## 2015-07-05 NOTE — Consult Note (Signed)
CARDIOLOGY CONSULT NOTE   Patient ID: [name] MRN: [id number], DOB/AGE: Jun 16, 1934   Admit date: 07/05/2015 Date of Consult: 07/05/2015   Primary Physician: Hollace Kinnier, DO Primary Cardiologist: new  - Dr. Debara Pickett  Pt. Profile  [name] is a pleasant 81 year old Caucasian female with past medical history of hypothyroidism, PVD, rheumatoid arthritis, and atrial fibrillation on Coumadin presented with L knee orthopedic surgery. During surgery, she had tachycardia. Post procedure EKG shows she is in aflutter with RVR  Problem List  Past Medical History  Diagnosis Date  . Ulcerative colitis ([place])   . Hypothyroidism   . Hypertension     pcp   dr reed   [facility]  . Pneumonia 2008  . Chronic bronchitis ([place]) 10/31/2011    "prone to it; I have sinus/bronchitis problem probably q yr"  . Multiple skin tears 10/31/2011    "get them very easily; my skin is thin and I bruise easily"  . Chronic back pain     "my entire spine"  . Edema   . Other and unspecified hyperlipidemia   . Encounter for long-term (current) use of other medications   . Senile osteoporosis   . Spinal stenosis, unspecified region other than cervical   . Unspecified glaucoma   . Hypothyroidism   . Anxiety     sometimes   . Swelling of right upper extremity 11/26/2014  . Diarrhea 01/27/2015  . Peripheral vascular disease ([place])   . Depression   . Anemia   . History of blood transfusion   . Hard of hearing   . Dizziness   . Falls   . H/O clavicle fracture     left  . Superficial thrombophlebitis 04/20/2015  . Complication of anesthesia 2009    htn with block- for hand surgery , pt thinks anesthesia causing some long term confusion  . Dysrhythmia     pt states bring off coumadin for paroxysmal atrial fibrillation for last month  . Rheumatoid arthritis(714.0)     degenerative disc disease , ra  . Bruise     right lower extremity with opsite on    Past Surgical History  Procedure Laterality  Date  . Back surgery    . Sinus surgery with instatrak    . Thyroidectomy, partial  1997  . Posterior fusion lumbar spine  10/31/2011    L2-3; 3-4  . Tonsillectomy      "when I was a small child"  . Functional endoscopic sinus surgery  1988  . Posterior fusion lumbar spine  11/2009    L3-4;  L4-5  . Spine surgery    . Posterior lumbar fusion N/A 08/05/13    T1, T2  . Anterior cervical decomp/discectomy fusion N/A 02/17/2014    Procedure: CERVICAL FOUR TO FIVE ANTERIOR CERVICAL DECOMPRESSION/DISCECTOMY FUSION 1 LEVEL;  Surgeon: [name], MD;  Location: [place] [facility];  Service: Neurosurgery;  Laterality: N/A;  C4-5 Anterior cervical decompression/diskectomy/fusion  . Cataract extraction Left 11/11/2013  . Cataract extraction Right 12/30/2013  . Total knee arthroplasty Left 06/16/2014    Procedure: TOTAL KNEE ARTHROPLASTY;  Surgeon: Paralee Cancel, MD;  Location: [facility];  Service: Orthopedics;  Laterality: Left;  . I&d knee with poly exchange Left 10/13/2014    Procedure: INCISION AND DRAINAGE LEFT TOTAL KNEE WITH POLY EXCHANGE;  Surgeon: Paralee Cancel, MD;  Location: [facility];  Service: Orthopedics;  Laterality: Left;  . Excisional total knee arthroplasty with antibiotic spacers Left 03/01/2015    Procedure: RESECTION  LEFT TOTAL KNEE ARTHROPLASTY WITH PLACEMENT OF  ANTIBIOTIC SPACERS;  Surgeon: Paralee Cancel, MD;  Location: [facility];  Service: Orthopedics;  Laterality: Left;  Marland Kitchen Eye surgery Bilateral   . Joint replacement  2009; 2006    joints 2 fingers,rt hand; joint left thumb     Allergies  No Known Allergies  HPI   [name] is a pleasant 81 year old Caucasian female with past medical history of hypothyroidism, PVD, rheumatoid arthritis, and atrial fibrillation on Coumadin. She says she has never seen a cardiologist before and that it is her PCP who manages her Coumadin. Last echo obtained on 04/17/2014 EF 123456, grade 1 diastolic dysfunction, normal left atrial size. She also states it has  been debated whether or not she truly need to see a cardiologist in the past. The last time she took her Coumadin was 2 weeks ago. The last EKG are system was stated 02/25/2015 which shows normal sinus rhythm. She has not had another EKG since. She has had recent antibiotic treatment for lower extremity ulcer and cellulitis of the left leg. She underwent reimplantation of L total knee with removal of antibiotic spacer. Postprocedure, she was noted to be in tachycardia, EKG shows atrial flutter with RVR with heart rate 120s. PACU was able to pull up anesthesia note from during the procedure, there is one portion in the note mentions that her heart rate increased during the surgery, however underlying rhythm has not changed compared to before. It is questionable at this time how long has she been in atrial flutter with RVR. She denies any active cardiac awareness. She denies any recent chest pain, she always side have some degree of dyspnea on exertion. She lives with her husband and the prior visit of this was able to take care of herself and the cleaning the house without any issues. She says she has been compliant with her metoprolol XL, however the last time she took metoprolol may be last night.   No current facility-administered medications on file prior to encounter.   Current Outpatient Prescriptions on File Prior to Encounter  Medication Sig Dispense Refill  . balsalazide (COLAZAL) 750 MG capsule Take 2,250 mg by mouth 2 (two) times daily.     . Calcium Carb-Cholecalciferol (CALCIUM + D3) 600-200 MG-UNIT TABS Take 1 tablet by mouth daily with breakfast.     . CELEBREX 200 MG capsule Take 200 mg by mouth 2 (two) times daily.     . Cholecalciferol (VITAMIN D3) 1000 UNITS CAPS Take 1,000 Units by mouth daily.    . diazepam (VALIUM) 5 MG tablet Take 1 tablet (5 mg total) by mouth every 6 (six) hours as needed for muscle spasms. 30 tablet 0  . doxycycline (VIBRA-TABS) 100 MG tablet Take 1 tablet (100  mg total) by mouth 2 (two) times daily. 60 tablet 11  . folic acid (FOLVITE) 1 MG tablet Take 2 mg by mouth daily.    . furosemide (LASIX) 40 MG tablet Take 1 tablet (40 mg total) by mouth daily. 30 tablet 3  . guaiFENesin (MUCINEX) 600 MG 12 hr tablet Take 600 mg by mouth daily as needed (sinus relief).     Marland Kitchen latanoprost (XALATAN) 0.005 % ophthalmic solution Place 1 drop into both eyes at bedtime.    Marland Kitchen levothyroxine (SYNTHROID) 88 MCG tablet Take 88 mcg by mouth daily before breakfast. DAW, DO NOT SUBSTITUTE WITH GENERIC    . medroxyPROGESTERone (PROVERA) 2.5 MG tablet Take 2.5 mg by mouth at bedtime.     Marland Kitchen  metoprolol succinate (TOPROL-XL) 25 MG 24 hr tablet Take 1 tablet (25 mg total) by mouth 2 (two) times daily. Take with or immediately following a meal. 60 tablet 3  . Multiple Vitamin (MULTIVITAMIN WITH MINERALS) TABS Take 1 tablet by mouth daily with lunch.     . oxyCODONE (OXY IR/ROXICODONE) 5 MG immediate release tablet Take 1-3 tablets (5-15 mg total) by mouth every 4 (four) hours as needed for severe pain. 120 tablet 0  . potassium chloride SA (K-DUR,KLOR-CON) 20 MEQ tablet Take 2 tablets (40 mEq total) by mouth daily. 30 tablet 3    Family History Family History  Problem Relation Age of Onset  . Heart disease Mother   . Cancer Father     lung     Social History Social History   Social History  . Marital Status: Married    Spouse Name: N/A  . Number of Children: N/A  . Years of Education: N/A   Occupational History  . Not on file.   Social History Main Topics  . Smoking status: Current Every Day Smoker -- 0.25 packs/day for 50 years    Types: Cigarettes    Last Attempt to Quit: 02/20/2015  . Smokeless tobacco: Never Used     Comment: one a day, i dont inhale per pt  . Alcohol Use: No  . Drug Use: No  . Sexual Activity: Not Currently   Other Topics Concern  . Not on file   Social History Narrative     Review of Systems  General:  No chills, fever, night  sweats or weight changes.  Cardiovascular:  No chest pain, dyspnea on exertion, edema, orthopnea, palpitations, paroxysmal nocturnal dyspnea. Dermatological: No rash, lesions/masses Respiratory: No cough, dyspnea Urologic: No hematuria, dysuria Abdominal:   No nausea, vomiting, diarrhea, bright red blood per rectum, melena, or hematemesis Neurologic:  No visual changes, changes in mental status. +wkns after anesthesia All other systems reviewed and are otherwise negative except as noted above.  Physical Exam  Blood pressure 167/94, pulse 136, temperature 97.9 F (36.6 C), temperature source Oral, resp. rate 14, height 5' 6.5" (1.689 m), weight 125 lb 8 oz (56.926 kg), SpO2 100 %.  General: Pleasant, NAD Psych: Normal affect. Neuro: Alert and oriented X 3. Moves all extremities spontaneously. HEENT: Normal  Neck: Supple without bruits or JVD. Lungs:  Resp regular and unlabored, CTA. Heart: regularly irregular. no s3, s4, or murmurs. Abdomen: Soft, non-tender, non-distended, BS + x 4.  Extremities: No clubbing, cyanosis or edema. DP/PT/Radials 2+ and equal bilaterally.  Labs  No results for input(s): CKTOTAL, CKMB, TROPONINI in the last 72 hours. Lab Results  Component Value Date   WBC 6.9 06/28/2015   HGB 12.0 06/28/2015   HCT 37.9 06/28/2015   MCV 87.5 06/28/2015   PLT 269 06/28/2015   No results for input(s): NA, K, CL, CO2, BUN, CREATININE, CALCIUM, PROT, BILITOT, ALKPHOS, ALT, AST, GLUCOSE in the last 168 hours.  Invalid input(s): LABALBU Lab Results  Component Value Date   CHOL 239* 03/23/2014   HDL 86 03/23/2014   LDLCALC 130* 03/23/2014   TRIG 115 03/23/2014   No results found for: DDIMER  Radiology/Studies  No results found.  ECG  Atrial flutter with RVR  ASSESSMENT AND PLAN  1. Atrial flutter with RVR: Unknown duration, last normal EKG dated December 2016  - This patients CHA2DS2-VASc Score and unadjusted Ischemic Stroke Rate (% per year) is equal to  4.8 % stroke rate/year from a score of  4  - Would not attempt cardioversion this time given the unknown duration of atrial flutter. Restart home beta blocker. Restart Coumadin once deemed low risk for bleeding. Will uptitrate BB as blood pressure allows in the next day or so. Hopefully once heart rate is controlled, she can go home and have outpatient cardioversion later.  - Note echocardiogram obtained on 04/17/2014 EF 123456, grade 1 diastolic dysfunction, normal left atrial size.  2. H/o PAF  3. Reimplantation of left TKA with removal of antibiotic spacer    Signed, Almyra Deforest, PA-C 07/05/2015, 5:52 PM

## 2015-07-05 NOTE — Anesthesia Preprocedure Evaluation (Addendum)
Anesthesia Evaluation  Patient identified by MRN, date of birth, ID band Patient awake    Reviewed: Allergy & Precautions, NPO status , Patient's Chart, lab work & pertinent test results  Airway Mallampati: I  TM Distance: >3 FB Neck ROM: Full    Dental   Pulmonary Current Smoker,    Pulmonary exam normal        Cardiovascular hypertension, Pt. on medications Normal cardiovascular exam     Neuro/Psych Anxiety Depression    GI/Hepatic   Endo/Other    Renal/GU      Musculoskeletal   Abdominal   Peds  Hematology   Anesthesia Other Findings   Reproductive/Obstetrics                            Anesthesia Physical Anesthesia Plan  ASA: III  Anesthesia Plan: General   Post-op Pain Management: GA combined w/ Regional for post-op pain   Induction: Intravenous  Airway Management Planned: Natural Airway  Additional Equipment:   Intra-op Plan:   Post-operative Plan:   Informed Consent: I have reviewed the patients History and Physical, chart, labs and discussed the procedure including the risks, benefits and alternatives for the proposed anesthesia with the patient or authorized representative who has indicated his/her understanding and acceptance.     Plan Discussed with: CRNA and Surgeon  Anesthesia Plan Comments:        Anesthesia Quick Evaluation

## 2015-07-05 NOTE — Transfer of Care (Signed)
Immediate Anesthesia Transfer of Care Note  [name]  Procedure(s) Performed: Procedure(s): REIMPLANTATION OF LEFT TOTAL KNEE WITH REMOVAL OF ANTIBIOTIC SPACER (Left)  Patient Location: PACU  Anesthesia Type:General  Level of Consciousness:  sedated, patient cooperative and responds to stimulation  Airway & Oxygen Therapy:Patient Spontanous Breathing and Patient connected to face mask oxgen  Post-op Assessment:  Report given to PACU RN and Post -op Vital signs reviewed and stable  Post vital signs:  Reviewed and stable  Last Vitals:  Filed Vitals:   07/05/15 1337 07/05/15 1338  BP:    Pulse:  89  Temp:    Resp: 17 17    Complications: No apparent anesthesia complications

## 2015-07-05 NOTE — Anesthesia Postprocedure Evaluation (Signed)
Anesthesia Post Note  [name]  Procedure(s) Performed: Procedure(s) (LRB): REIMPLANTATION OF LEFT TOTAL KNEE WITH REMOVAL OF ANTIBIOTIC SPACER (Left)  Patient location during evaluation: PACU Anesthesia Type: General Level of consciousness: awake and alert Pain management: pain level controlled Vital Signs Assessment: post-procedure vital signs reviewed and stable Respiratory status: spontaneous breathing, nonlabored ventilation, respiratory function stable and patient connected to nasal cannula oxygen Postop Assessment: no signs of nausea or vomiting Anesthetic complications: no Comments: Seemed to be in A Flutter at  About 130. Plan Cardiology Consult.    Last Vitals:  Filed Vitals:   07/05/15 1720 07/05/15 1741  BP: 162/94 167/94  Pulse:  136  Temp: 36.4 C 36.6 C  Resp: 14 14    Last Pain:  Filed Vitals:   07/05/15 1812  PainSc: 4                  Elridge Stemm DAVID

## 2015-07-05 NOTE — Anesthesia Procedure Notes (Addendum)
Anesthesia Regional Block:  Adductor canal block  Pre-Anesthetic Checklist: ,, timeout performed, Correct Patient, Correct Site, Correct Laterality, Correct Procedure, Correct Position, site marked, Risks and benefits discussed,  Surgical consent,  Pre-op evaluation,  At surgeon's request and post-op pain management  Laterality: Left  Prep: chloraprep       Needles:  Injection technique: Single-shot  Needle Type: Echogenic Stimulator Needle     Needle Length: 9cm 9 cm Needle Gauge: 21 and 21 G    Additional Needles:  Procedures: ultrasound guided (picture in chart) Adductor canal block Narrative:  Start time: 07/05/2015 1:30 PM End time: 07/05/2015 1:40 PM Injection made incrementally with aspirations every 5 mL.  Performed by: Personally  Anesthesiologist: Lillia Abed  Additional Notes: Monitors applied. Patient sedated. Sterile prep and drape,hand hygiene and sterile gloves were used. Relevant anatomy identified.Needle position confirmed.Local anesthetic injected incrementally after negative aspiration. Local anesthetic spread visualized around nerve(s). Vascular puncture avoided. No complications. Image printed for medical record.The patient tolerated the procedure well.    Lillia Abed MD   Procedure Name: Intubation Date/Time: 07/05/2015 2:30 PM Performed by: Freddie Breech Pre-anesthesia Checklist: Patient identified, Emergency Drugs available, Suction available, Patient being monitored and Timeout performed Patient Re-evaluated:Patient Re-evaluated prior to inductionOxygen Delivery Method: Circle system utilized Preoxygenation: Pre-oxygenation with 100% oxygen Intubation Type: IV induction Ventilation: Mask ventilation without difficulty Laryngoscope Size: Mac and 3 Grade View: Grade I Tube type: Oral Tube size: 7.0 mm Number of attempts: 1 Airway Equipment and Method: Patient positioned with wedge pillow and Stylet Placement Confirmation: ETT inserted  through vocal cords under direct vision,  positive ETCO2 and breath sounds checked- equal and bilateral Secured at: 21 cm Tube secured with: Tape Dental Injury: Teeth and Oropharynx as per pre-operative assessment

## 2015-07-05 NOTE — Interval H&P Note (Signed)
History and Physical Interval Note:  07/05/2015 1:17 PM  [name]  has presented today for surgery, with the diagnosis of status post resection of left total knee with placement of antibiotic spacer  The various methods of treatment have been discussed with the patient and family. After consideration of risks, benefits and other options for treatment, the patient has consented to  Procedure(s): REIMPLANTATION OF LEFT TOTAL KNEE WITH REMOVAL OF ANTIBIOTIC SPACER (Left) as a surgical intervention .  The patient's history has been reviewed, patient examined, no change in status, stable for surgery.  I have reviewed the patient's chart and labs.  Questions were answered to the patient's satisfaction.     Mauri Pole

## 2015-07-05 NOTE — Brief Op Note (Signed)
07/05/2015  1:47 PM  PATIENT:  [name]  81 y.o. female  PRE-OPERATIVE DIAGNOSIS:  status post resection of left total knee with placement of antibiotic spacer  POST-OPERATIVE DIAGNOSIS:  status post resection of left total knee with placement of antibiotic spacer  PROCEDURE:  Procedure(s): REIMPLANTATION OF LEFT TOTAL KNEE WITH REMOVAL OF ANTIBIOTIC SPACER (Left)  SURGEON:  Surgeon(s) and Role:    * Paralee Cancel, MD - Primary  PHYSICIAN ASSISTANT: Danae Orleans, PA-C  ANESTHESIA:   regional and general  EBL:   <150cc  BLOOD ADMINISTERED:none  DRAINS: none   LOCAL MEDICATIONS USED:  MARCAINE     SPECIMEN:  No Specimen and Source of Specimen:  left knee synovial fluid  DISPOSITION OF SPECIMEN:  PATHOLOGY  COUNTS:  YES  TOURNIQUET: 5143 min @ 231mmHg  DICTATION: .Other Dictation: Dictation Number [id number]  PLAN OF CARE: Admit to inpatient   PATIENT DISPOSITION:  PACU - hemodynamically stable.   Delay start of Pharmacological VTE agent (>24hrs) due to surgical blood loss or risk of bleeding: no

## 2015-07-06 ENCOUNTER — Encounter (HOSPITAL_COMMUNITY): Payer: Self-pay | Admitting: Orthopedic Surgery

## 2015-07-06 DIAGNOSIS — D72829 Elevated white blood cell count, unspecified: Secondary | ICD-10-CM

## 2015-07-06 DIAGNOSIS — Z96652 Presence of left artificial knee joint: Secondary | ICD-10-CM

## 2015-07-06 DIAGNOSIS — R7982 Elevated C-reactive protein (CRP): Secondary | ICD-10-CM

## 2015-07-06 DIAGNOSIS — M069 Rheumatoid arthritis, unspecified: Secondary | ICD-10-CM

## 2015-07-06 LAB — CBC
HCT: 32.5 % — ABNORMAL LOW (ref 36.0–46.0)
HEMOGLOBIN: 10.4 g/dL — AB (ref 12.0–15.0)
MCH: 26.9 pg (ref 26.0–34.0)
MCHC: 32 g/dL (ref 30.0–36.0)
MCV: 84 fL (ref 78.0–100.0)
Platelets: 204 10*3/uL (ref 150–400)
RBC: 3.87 MIL/uL (ref 3.87–5.11)
RDW: 16 % — ABNORMAL HIGH (ref 11.5–15.5)
WBC: 10.5 10*3/uL (ref 4.0–10.5)

## 2015-07-06 LAB — BASIC METABOLIC PANEL
Anion gap: 9 (ref 5–15)
BUN: 27 mg/dL — ABNORMAL HIGH (ref 6–20)
CHLORIDE: 109 mmol/L (ref 101–111)
CO2: 23 mmol/L (ref 22–32)
CREATININE: 1.05 mg/dL — AB (ref 0.44–1.00)
Calcium: 8.6 mg/dL — ABNORMAL LOW (ref 8.9–10.3)
GFR calc non Af Amer: 50 mL/min — ABNORMAL LOW (ref >60)
GFR, EST AFRICAN AMERICAN: 57 mL/min — AB (ref >60)
Glucose, Bld: 135 mg/dL — ABNORMAL HIGH (ref 65–99)
Potassium: 5.1 mmol/L (ref 3.5–5.1)
Sodium: 141 mmol/L (ref 135–145)

## 2015-07-06 MED ORDER — SODIUM CHLORIDE 0.9 % IV SOLN
1.0000 g | INTRAVENOUS | Status: DC
  Administered 2015-07-06 – 2015-07-08 (×3): 1 g via INTRAVENOUS
  Filled 2015-07-06 (×3): qty 1

## 2015-07-06 NOTE — Addendum Note (Signed)
Addendum  created 07/06/15 1353 by Lollie Sails, CRNA   Modules edited: Charges VN

## 2015-07-06 NOTE — Progress Notes (Signed)
Patient ID: [name], female   DOB: November 30, 1934, 81 y.o.   MRN: [id number] Subjective: 1 Day Post-Op Procedure(s) (LRB): REIMPLANTATION OF LEFT TOTAL KNEE WITH REMOVAL OF ANTIBIOTIC SPACER (Left)    Patient reports pain as moderate.  Fairly anxious regarding prognosis which is understandable.  No events as it pertains to her heart and monitoring   Objective:   VITALS:   Filed Vitals:   07/06/15 0655 07/06/15 0900  BP: 113/60 122/62  Pulse: 57 61  Temp: 97.8 F (36.6 C) 98.1 F (36.7 C)  Resp: 16 16    Neurovascular intact Incision: dressing C/D/I  LABS  Recent Labs  07/06/15 0508  HGB 10.4*  HCT 32.5*  WBC 10.5  PLT 204     Recent Labs  07/06/15 0508  NA 141  K 5.1  BUN 27*  CREATININE 1.05*  GLUCOSE 135*    No results for input(s): LABPT, INR in the last 72 hours.   Assessment/Plan: 1 Day Post-Op Procedure(s) (LRB): REIMPLANTATION OF LEFT TOTAL KNEE WITH REMOVAL OF ANTIBIOTIC SPACER (Left)   Up with therapy Discharge to SNF possibly  PIC line ID consult (IV antibiotic regiment with transition to oral suppressive antibiotics) Nutrition consult, CMP to eval of albumin Add boost shakes to meals

## 2015-07-06 NOTE — Evaluation (Addendum)
Physical Therapy Evaluation Patient Details Name: [name] MRN: [id number] DOB: 08-08-1934 Today's Date: 07/06/2015   History of Present Illness  81 yo female s/p L total knee reimplantation, removal of spacer 07/05/15. Post op A flutter with RVR. Hx of RA, back surgery.  Clinical Impression  On eval, pt required Mod assist for mobility. Pt was able to perform stand pivot multiple times as well as take a few steps in room with walker. Mod encouragement required. Educated pt on WBAT status. Recommend ST rehab at SNF. Pain rated 8/10 during session.     Follow Up Recommendations SNF    Equipment Recommendations  None recommended by PT    Recommendations for Other Services       Precautions / Restrictions Precautions Precautions: Fall;Knee Restrictions Weight Bearing Restrictions: No LLE Weight Bearing: Weight bearing as tolerated      Mobility  Bed Mobility Overal bed mobility: Needs Assistance Bed Mobility: Supine to Sit     Supine to sit: Mod assist;HOB elevated     General bed mobility comments: Assist for L LE and for scooting to EOB. Utilized bedpad to aid with scooting. Increased time. VCs safety, technique.   Transfers Overall transfer level: Needs assistance Equipment used: Rolling walker (2 wheeled) Transfers: Sit to/from [facility] Sit to Stand: Mod assist Stand pivot transfers: Min assist       General transfer comment: Assist to rise, stabilize, control descent. Stand pivot x3, bed >recliner, recliner<>bsc wit RW. Increased time. VCs safety, technique, hand/LE placement.   Ambulation/Gait   Ambulation Distance (Feet): 4 Feet Assistive device: Rolling walker (2 wheeled) Gait Pattern/deviations: Step-to pattern;Trunk flexed;Antalgic     General Gait Details: Assist to stabilize pt and maneuver safely with walker. Very slow and antalgic gait pattern.   Stairs            Wheelchair Mobility    Modified Rankin (Stroke  Patients Only)       Balance                                             Pertinent Vitals/Pain Pain Assessment: 0-10 Pain Score: 8  Pain Location: L knee Pain Descriptors / Indicators: Aching;Sore Pain Intervention(s): Monitored during session;Ice applied;Repositioned;Limited activity within patient's tolerance    Home Living Family/patient expects to be discharged to:: Unsure Living Arrangements: Spouse/significant other Available Help at Discharge: Family Type of Home: House Home Access: Stairs to enter Entrance Stairs-Rails: Right;Left;Can reach both Entrance Stairs-Number of Steps: deck-4  Home Layout: Multi-level;Able to live on main level with bedroom/bathroom (on a couch) Home Equipment: Walker - 2 wheels;Cane - single point;Bedside commode      Prior Function                 Hand Dominance        Extremity/Trunk Assessment   Upper Extremity Assessment: Defer to OT evaluation           Lower Extremity Assessment: LLE deficits/detail   LLE Deficits / Details: joint deformities noted due to RA  Cervical / Trunk Assessment: Kyphotic  Communication   Communication: HOH  Cognition Arousal/Alertness: Awake/alert Behavior During Therapy: WFL for tasks assessed/performed (very talkative. Displeased with situation) Overall Cognitive Status: Within Functional Limits for tasks assessed  General Comments      Exercises        Assessment/Plan    PT Assessment Patient needs continued PT services  PT Diagnosis Difficulty walking;Generalized weakness;Acute pain   PT Problem List Decreased strength;Decreased range of motion;Decreased activity tolerance;Decreased balance;Pain;Decreased mobility;Decreased knowledge of use of DME;Decreased knowledge of precautions  PT Treatment Interventions DME instruction;Gait training;Functional mobility training;Therapeutic activities;Patient/family education;Balance  training;Therapeutic exercise   PT Goals (Current goals can be found in the Care Plan section) Acute Rehab PT Goals Patient Stated Goal: less pain PT Goal Formulation: With patient Time For Goal Achievement: 07/13/15 Potential to Achieve Goals: Fair    Frequency 7X/week   Barriers to discharge        Co-evaluation               End of Session Equipment Utilized During Treatment: Gait belt Activity Tolerance: Patient limited by fatigue;Patient limited by pain Patient left: in chair;with call bell/phone within reach;with chair alarm set           Time: [id number] PT Time Calculation (min) (ACUTE ONLY): 48 min   Charges:   PT Evaluation $PT Eval Low Complexity: 1 Procedure PT Treatments $Therapeutic Activity: 23-37 mins   PT G Codes:        Weston Anna, MPT Pager: [phone]

## 2015-07-06 NOTE — NC FL2 (Signed)
[place] MEDICAID FL2 LEVEL OF CARE SCREENING TOOL     IDENTIFICATION  Patient Name: [name] Birthdate: 06/21/1934 Sex: female Admission Date (Current Location): 07/05/2015  [facility] and [place] Number:  Herbalist and Address:  [facility],  [place] [address] [place]      Provider Number: [id number]  Attending Physician Name and Address:  Paralee Cancel, MD  Relative Name and Phone Number:       Current Level of Care: Hospital Recommended Level of Care: [place] Prior Approval Number:    Date Approved/Denied:   PASRR Number: [id number]  Discharge Plan: SNF    Current Diagnoses: Patient Active Problem List   Diagnosis Date Noted  . S/P left TK reimplantation 07/05/2015  . S/P revision of total knee 07/05/2015  . Persistent atrial fibrillation ([place])   . Superficial thrombophlebitis 04/20/2015  . Left TK resection / spacer 03/01/2015  . Diarrhea 01/27/2015  . Atrial fibrillation ([place]) [I48.91] 12/14/2014  . Long term (current) use of anticoagulants [Z79.01] 12/14/2014  . Adhesive capsulitis of right shoulder 12/10/2014  . Swelling of right upper extremity 11/26/2014  . Acute on chronic diastolic CHF (congestive heart failure), NYHA class 1 ([place]) 10/29/2014  . Cellulitis 10/29/2014  . Anemia, iron deficiency 10/29/2014  . Infection of prosthetic left knee joint ([place]) 10/13/2014  . Benign essential HTN   . S/P left TKA 06/16/2014  . Cervical spondylosis with myelopathy 02/17/2014  . Chronic venous stasis dermatitis of both lower extremities 09/08/2013  . Lumbar stenosis with neurogenic claudication 08/05/2013  . Hypothyroidism   . Rheumatoid arthritis ([place]) 11/22/2011  . Ulcerative colitis, chronic ([place]) 11/22/2011    Orientation RESPIRATION BLADDER Height & Weight     Self, Time, Situation, Place  Normal Continent Weight: 125 lb 8 oz (56.926 kg) Height:  5' 6.5" (168.9 cm)  BEHAVIORAL SYMPTOMS/MOOD  NEUROLOGICAL BOWEL NUTRITION STATUS  Other (Comment) (No Behaviors)   Continent Diet (Diet Regular)  AMBULATORY STATUS COMMUNICATION OF NEEDS Skin   Limited Assist Verbally Surgical wounds                       Personal Care Assistance Level of Assistance  Bathing, Feeding, Dressing Bathing Assistance: Limited assistance Feeding assistance: Independent Dressing Assistance: Limited assistance     Functional Limitations Info  Sight, Hearing, Speech Sight Info: Adequate Hearing Info: Impaired Speech Info: Adequate    SPECIAL CARE FACTORS FREQUENCY  PT (By licensed PT), OT (By licensed OT)     PT Frequency: 5 x a week OT Frequency: 5 x a week            Contractures Contractures Info: Not present    Additional Factors Info  Code Status Code Status Info: FULL code status             Current Medications (07/06/2015):  This is the current hospital active medication list Current Facility-Administered Medications  Medication Dose Route Frequency Provider Last Rate Last Dose  . 0.9 %  sodium chloride infusion   Intravenous Continuous Danae Orleans, PA-C 75 mL/hr at 07/05/15 1857    . alum & mag hydroxide-simeth (MAALOX/MYLANTA) 200-200-20 MG/5ML suspension 30 mL  30 mL Oral Q4H PRN Danae Orleans, PA-C      . aspirin EC tablet 325 mg  325 mg Oral BID Danae Orleans, PA-C   325 mg at 07/06/15 1000  . balsalazide (COLAZAL) capsule 2,250 mg  2,250 mg Oral BID Danae Orleans, PA-C  2,250 mg at 07/06/15 1000  . bisacodyl (DULCOLAX) suppository 10 mg  10 mg Rectal Daily PRN Danae Orleans, PA-C      . celecoxib (CELEBREX) capsule 200 mg  200 mg Oral Q12H Danae Orleans, PA-C   200 mg at 07/06/15 1000  . dexamethasone (DECADRON) injection 10 mg  10 mg Intravenous Once Danae Orleans, PA-C      . diazepam (VALIUM) tablet 5 mg  5 mg Oral Q6H PRN Danae Orleans, PA-C   5 mg at 07/06/15 1113  . diltiazem (CARDIZEM) 100 mg in dextrose 5 % 100 mL (1 mg/mL) infusion  5-15 mg/hr  Intravenous Titrated Pixie Casino, MD   Stopped at 07/06/15 0221  . diphenhydrAMINE (BENADRYL) capsule 25 mg  25 mg Oral Q6H PRN Danae Orleans, PA-C      . docusate sodium (COLACE) capsule 100 mg  100 mg Oral BID PRN Danae Orleans, PA-C      . doxycycline (VIBRA-TABS) tablet 100 mg  100 mg Oral BID Danae Orleans, PA-C   100 mg at 07/06/15 1000  . feeding supplement (BOOST / RESOURCE BREEZE) liquid 1 Container  1 Container Oral TID BM Paralee Cancel, MD   1 Container at 07/06/15 1000  . ferrous sulfate tablet 325 mg  325 mg Oral TID PC Danae Orleans, PA-C   325 mg at 07/06/15 1314  . furosemide (LASIX) tablet 40 mg  40 mg Oral Daily Paralee Cancel, MD   40 mg at 07/06/15 1113  . HYDROmorphone (DILAUDID) injection 0.5-2 mg  0.5-2 mg Intravenous Q2H PRN Danae Orleans, PA-C      . latanoprost (XALATAN) 0.005 % ophthalmic solution 1 drop  1 drop Both Eyes QHS Danae Orleans, PA-C   1 drop at 07/05/15 2108  . levothyroxine (SYNTHROID, LEVOTHROID) tablet 88 mcg  88 mcg Oral QAC breakfast Danae Orleans, PA-C   88 mcg at 07/06/15 0900  . magnesium citrate solution 1 Bottle  1 Bottle Oral Once PRN Danae Orleans, PA-C      . medroxyPROGESTERone (PROVERA) tablet 2.5 mg  2.5 mg Oral QHS Danae Orleans, PA-C   2.5 mg at 07/05/15 2107  . menthol-cetylpyridinium (CEPACOL) lozenge 3 mg  1 lozenge Oral PRN Danae Orleans, PA-C   1 lozenge at 07/06/15 0142   Or  . phenol (CHLORASEPTIC) mouth spray 1 spray  1 spray Mouth/Throat PRN Danae Orleans, PA-C      . methocarbamol (ROBAXIN) tablet 500 mg  500 mg Oral Q6H PRN Danae Orleans, PA-C       Or  . methocarbamol (ROBAXIN) 500 mg in dextrose 5 % 50 mL IVPB  500 mg Intravenous Q6H PRN Danae Orleans, PA-C   500 mg at 07/05/15 1658  . metoCLOPramide (REGLAN) tablet 5-10 mg  5-10 mg Oral Q8H PRN Danae Orleans, PA-C       Or  . metoCLOPramide (REGLAN) injection 5-10 mg  5-10 mg Intravenous Q8H PRN Danae Orleans, PA-C      . metoprolol succinate (TOPROL-XL) 24  hr tablet 25 mg  25 mg Oral BID Danae Orleans, PA-C   25 mg at 07/06/15 1000  . ondansetron (ZOFRAN) tablet 4 mg  4 mg Oral Q6H PRN Danae Orleans, PA-C       Or  . ondansetron [facility]) injection 4 mg  4 mg Intravenous Q6H PRN Danae Orleans, PA-C      . oxyCODONE (Oxy IR/ROXICODONE) immediate release tablet 10-20 mg  10-20 mg Oral Q4H Danae Orleans, PA-C   10 mg at 07/06/15  1313  . polyethylene glycol (MIRALAX / GLYCOLAX) packet 17 g  17 g Oral BID PRN Danae Orleans, PA-C      . potassium chloride SA (K-DUR,KLOR-CON) CR tablet 40 mEq  40 mEq Oral Daily Danae Orleans, PA-C   40 mEq at 07/06/15 1000  . predniSONE (DELTASONE) tablet 4 mg  4 mg Oral Daily Danae Orleans, PA-C   4 mg at 07/06/15 1000  . spironolactone (ALDACTONE) tablet 25 mg  25 mg Oral Daily Danae Orleans, PA-C   25 mg at 07/06/15 1000     Discharge Medications: Please see discharge summary for a list of discharge medications.  Relevant Imaging Results:  Relevant Lab Results:   Additional Information SS # [id number]  KIDD, SUZANNA A, LCSW

## 2015-07-06 NOTE — Progress Notes (Signed)
Physical Therapy Treatment Patient Details Name: [name] MRN: [id number] DOB: 05-08-1934 Today's Date: 07/06/2015    History of Present Illness 81 yo female s/p L total knee reimplantation, removal of spacer 07/05/15. Hx of RA, back surgery.    PT Comments    Progressing slowly with mobility. Will need SNF  Follow Up Recommendations  SNF     Equipment Recommendations  None recommended by PT    Recommendations for Other Services       Precautions / Restrictions Precautions Precautions: Fall;Knee Restrictions Weight Bearing Restrictions: No LLE Weight Bearing: Weight bearing as tolerated    Mobility  Bed Mobility Overal bed mobility: Needs Assistance Bed Mobility: Sit to Supine     Supine to sit: Mod assist;HOB elevated Sit to supine: Min assist   General bed mobility comments: Assist for L LE  Transfers Overall transfer level: Needs assistance Equipment used: Rolling walker (2 wheeled) Transfers: Sit to/from Stand Sit to Stand: Min assist Stand pivot transfers: Min assist       General transfer comment: Assist to rise, stabilize, control descent. Stand pivot x1 - recliner<>bsc with RW. Increased time. VCs safety, technique, hand/LE placement.   Ambulation/Gait Ambulation/Gait assistance: Min assist Ambulation Distance (Feet): 4 Feet Assistive device: Rolling walker (2 wheeled) Gait Pattern/deviations: Antalgic;Trunk flexed;Step-to pattern     General Gait Details: Assist to stabilize pt and maneuver safely with walker. Very slow and antalgic gait pattern.    Stairs            Wheelchair Mobility    Modified Rankin (Stroke Patients Only)       Balance Overall balance assessment: Needs assistance Sitting-balance support: No upper extremity supported;Feet supported Sitting balance-Leahy Scale: Fair     Standing balance support: Bilateral upper extremity supported Standing balance-Leahy Scale: Fair                       Cognition Arousal/Alertness: Awake/alert Behavior During Therapy: WFL for tasks assessed/performed (talkative) Overall Cognitive Status: Within Functional Limits for tasks assessed                      Exercises Total Joint Exercises Ankle Circles/Pumps: AROM;Both;10 reps;Supine Quad Sets: AROM;Both;10 reps;Supine Heel Slides: AAROM;Left;10 reps;Supine Hip ABduction/ADduction: AAROM;Left;10 reps;Supine Straight Leg Raises: AAROM;Left;10 reps;Supine Goniometric ROM: ~10-70 degrees    General Comments        Pertinent Vitals/Pain Pain Assessment: 0-10 Pain Score: 7  Faces Pain Scale: Hurts whole lot Pain Location: L knee Pain Descriptors / Indicators: Sore;Aching Pain Intervention(s): Monitored during session;Repositioned    Home Living Family/patient expects to be discharged to:: Skilled nursing facility Living Arrangements: Spouse/significant other Available Help at Discharge: Family Type of Home: House Home Access: Stairs to enter Entrance Stairs-Rails: Right;Left;Can reach both Home Layout: Multi-level;Able to live on main level with bedroom/bathroom Home Equipment: Gilford Rile - 2 wheels;Cane - single point;Bedside commode Additional Comments: Above taken from PT entry this admission. Pt unable to answer questions unsure if this was due to [facility] or cognition?     Prior Function Level of Independence: Needs assistance    ADL's / Homemaking Assistance Needed: Pt reports that she has been sponge bathing for the past 4 months or so     PT Goals (current goals can now be found in the care plan section) Acute Rehab PT Goals Patient Stated Goal: none stated PT Goal Formulation: With patient Time For Goal Achievement: 07/13/15 Potential to Achieve Goals: Fair Progress towards PT  goals: Progressing toward goals    Frequency  7X/week    PT Plan Current plan remains appropriate    Co-evaluation             End of Session Equipment Utilized During Treatment:  Gait belt Activity Tolerance: Patient limited by fatigue;Patient limited by pain Patient left: in bed;with call bell/phone within reach;with bed alarm set     Time: [id number] PT Time Calculation (min) (ACUTE ONLY): 30 min  Charges:  $Gait Training: 8-22 mins $Therapeutic Exercise: 8-22 mins $Therapeutic Activity: 23-37 mins                    G Codes:      Weston Anna, MPT Pager: [phone]

## 2015-07-06 NOTE — Evaluation (Signed)
Occupational Therapy Evaluation Patient Details Name: [name] MRN: [id number] DOB: 10/21/1934 Today's Date: 07/06/2015    History of Present Illness 81 yo female s/p L total knee reimplantation, removal of spacer 07/05/15. Hx of RA, back surgery.   Clinical Impression   Patient presenting with decreased ADL and functional mobility independence secondary to above. Patient required assistance and was sponge bathing PTA per patient's report. Patient currently functioning at an overall min to mod assist level. Patient will benefit from acute OT to increase overall independence in the areas of ADLs, functional mobility, and overall safety in order to safely discharge to venue listed below.     Follow Up Recommendations  SNF;Supervision/Assistance - 24 hour    Equipment Recommendations  Other (comment) (TBD)    Recommendations for Other Services  None at this time   Precautions / Restrictions Precautions Precautions: Fall;Knee Restrictions Weight Bearing Restrictions: Yes LLE Weight Bearing: Weight bearing as tolerated    Mobility Bed Mobility - Per PT note, pt found in recliner upon OT entering/exiting room  Overal bed mobility: Needs Assistance Bed Mobility: Supine to Sit     Supine to sit: Mod assist;HOB elevated     General bed mobility comments: Assist for L LE and for scooting to EOB. Utilized bedpad to aid with scooting. Increased time. VCs safety, technique.   Transfers Overall transfer level: Needs assistance Equipment used: Rolling walker (2 wheeled) Transfers: Sit to/from [facility] Sit to Stand: Mod assist Stand pivot transfers: Mod assist       General transfer comment: Assist to rise, stabilize, control descent. Stand pivot x2 - recliner<>bsc with RW. Increased time. VCs safety, technique, hand/LE placement.     Balance Overall balance assessment: Needs assistance Sitting-balance support: No upper extremity supported;Feet  supported Sitting balance-Leahy Scale: Fair     Standing balance support: Bilateral upper extremity supported Standing balance-Leahy Scale: Fair    ADL Overall ADL's : Needs assistance/impaired Eating/Feeding: Set up;Sitting   Grooming: Set up;Sitting   Upper Body Bathing: Minimal assitance;Sitting   Lower Body Bathing: Moderate assistance;Sit to/from stand   Upper Body Dressing : Minimal assistance;Sitting   Lower Body Dressing: Maximal assistance;Sit to/from stand   Toilet Transfer: Moderate assistance;Cueing for safety;BSC;RW   Toileting- Clothing Manipulation and Hygiene: Minimal assistance;Sit to/from stand Toileting - Clothing Manipulation Details (indicate cue type and reason): mod assist for sit to stand    Tub/Shower Transfer Details (indicate cue type and reason): did not occur Functional mobility during ADLs: Moderate assistance;Rolling walker;Cueing for safety;Cueing for sequencing       Vision Vision Assessment?: No apparent visual deficits          Pertinent Vitals/Pain Pain Assessment: Faces Pain Score: 8  Faces Pain Scale: Hurts whole lot Pain Location: L knee Pain Descriptors / Indicators: Aching;Sore Pain Intervention(s): Monitored during session;Repositioned;Limited activity within patient's tolerance    Hand Dominance Right   Extremity/Trunk Assessment Upper Extremity Assessment Upper Extremity Assessment: Generalized weakness (arthritis in hands)   Lower Extremity Assessment Lower Extremity Assessment: Defer to PT evaluation LLE Deficits / Details: joint deformities noted due to RA LLE: Unable to fully assess due to pain   Cervical / Trunk Assessment Cervical / Trunk Assessment: Kyphotic   Communication Communication Communication: HOH   Cognition Arousal/Alertness: Awake/alert Behavior During Therapy: Anxious Overall Cognitive Status: No family/caregiver present to determine baseline cognitive functioning (Pt had difficulty  answering questions. Unsure if this was due to [facility] vs cognition? )  Home Living Family/patient expects to be discharged to:: Skilled nursing facility Living Arrangements: Spouse/significant other Available Help at Discharge: Family Type of Home: House Home Access: Stairs to enter [employer] of Steps: deck-4  Entrance Stairs-Rails: Right;Left;Can reach both Home Layout: Multi-level;Able to live on main level with bedroom/bathroom Alternate Level Stairs-Number of Steps: 1 flight   Bathroom Shower/Tub: Walk-in shower (per entry from previous admission)   Bathroom Toilet: Handicapped height (per entry from previous admission)     Home Equipment: Walker - 2 wheels;Cane - single point;Bedside commode   Additional Comments: Above taken from PT entry this admission. Pt unable to answer questions unsure if this was due to [facility] or cognition?       Prior Functioning/Environment Level of Independence: Needs assistance    ADL's / Homemaking Assistance Needed: Pt reports that she has been sponge bathing for the past 4 months or so    OT Diagnosis: Generalized weakness;Acute pain   OT Problem List: Decreased strength;Decreased range of motion;Decreased activity tolerance;Impaired balance (sitting and/or standing);Decreased coordination;Decreased safety awareness;Decreased cognition;Decreased knowledge of use of DME or AE;Decreased knowledge of precautions;Pain   OT Treatment/Interventions: Self-care/ADL training;Therapeutic exercise;Energy conservation;DME and/or AE instruction;Therapeutic activities;Patient/family education;Balance training    OT Goals(Current goals can be found in the care plan section) Acute Rehab OT Goals Patient Stated Goal: none stated OT Goal Formulation: With patient Time For Goal Achievement: 07/13/15 Potential to Achieve Goals: Good ADL Goals Pt Will Perform Grooming: with supervision;standing Pt Will Perform Lower Body Bathing: with  min assist;sit to/from stand Pt Will Perform Lower Body Dressing: with min assist;sit to/from stand Pt Will Transfer to Toilet: with min assist;bedside commode;stand pivot transfer  OT Frequency: Min 2X/week   Barriers to D/C: Unsure    End of Session Equipment Utilized During Treatment: Gait belt;Rolling walker  Activity Tolerance: Patient tolerated treatment well Patient left: in chair;with call bell/phone within reach;with chair alarm set   Time: 1343-1405 OT Time Calculation (min): 22 min Charges:  OT General Charges $OT Visit: 1 Procedure OT Evaluation $OT Eval Moderate Complexity: 1 Procedure  Chrys Racer , MS, OTR/L, CLT Pager: [phone]  07/06/2015, 2:36 PM

## 2015-07-06 NOTE — Clinical Social Work Note (Signed)
Clinical Social Work Assessment  Patient Details  Name: [name] MRN: [id number] Date of Birth: 1935-02-13  Date of referral:  07/06/15               Reason for consult:  Discharge Planning                Permission sought to share information with:  Family Supports Permission granted to share information::     Name::        Agency::     Relationship::     Contact Information:     Housing/Transportation Living arrangements for the past 2 months:  [place] (pt has been at [facility] as private pay for the past 2 weeks) Source of Information:  Patient Patient Interpreter Needed:  None Criminal Activity/Legal Involvement Pertinent to Current Situation/Hospitalization:  No - Comment as needed Significant Relationships:  Spouse Lives with:  Facility Resident (pt has been at [facility] the past 2 weeks as private pay) Do you feel safe going back to the place where you live?    Need for family participation in patient care:  No (Coment)  Care giving concerns:  Pt admitted from [facility] where pt has been privately paying for the past two weeks, but plan will be to discharge back to [facility] under [facility] Medicare for rehab.    Social Worker assessment / plan:    CSW met with pt at bedside to complete psychosocial assessment. CSW introduced self and explained role. Pt confirmed that she admitted from [facility] and plans to return when medically ready. Pt aware that [facility] will begin billing [facility] Medicare for rehab upon return.   CSW to complete FL2 and send clinical information to [facility].   CSW to continue to follow to provide support and assist with pt discharge planning needs.   Employment status:  Retired Nurse, adult PT Recommendations:  Not assessed at this time Information / Referral to community resources:  [place] (Referral back to [facility])  Patient/Family's Response to care:  Pt alert and oriented x  4. Pt states that she is eager to reach pt husband as he has her belongings. Pt plans to return to [facility] upon discharge for short term rehab.  Patient/Family's Understanding of and Emotional Response to Diagnosis, Current Treatment, and Prognosis:  Pt displayed understanding surrounding pt diagnosis and treatment plan. Pt states that she has not yet seen MD today.   Emotional Assessment Appearance:  Appears stated age Attitude/Demeanor/Rapport:  Other (cooperative) Affect (typically observed):  Appropriate, Pleasant Orientation:  Oriented to Self, Oriented to Place, Oriented to  Time, Oriented to Situation Alcohol / Substance use:  Not Applicable Psych involvement (Current and /or in the community):  No (Comment)  Discharge Needs  Concerns to be addressed:  Discharge Planning Concerns Readmission within the last 30 days:  No Current discharge risk:  None Barriers to Discharge:  Continued Medical Work up   Ladell Pier, LCSW 07/06/2015, 11:13 AM [phone]

## 2015-07-06 NOTE — Consult Note (Signed)
[place] for Infectious Disease       Reason for Consult:  Possible infected knee s/p reimplantation   Referring Physician: Dr. Alvan Dame  Principal Problem:   S/P left TK reimplantation Active Problems:   S/P revision of total knee   Persistent atrial fibrillation ([place])   . aspirin EC  325 mg Oral BID  . balsalazide  2,250 mg Oral BID  . celecoxib  200 mg Oral Q12H  . doxycycline  100 mg Oral BID  . feeding supplement  1 Container Oral TID BM  . ferrous sulfate  325 mg Oral TID PC  . furosemide  40 mg Oral Daily  . latanoprost  1 drop Both Eyes QHS  . levothyroxine  88 mcg Oral QAC breakfast  . medroxyPROGESTERone  2.5 mg Oral QHS  . metoprolol succinate  25 mg Oral BID  . oxyCODONE  10-20 mg Oral Q4H  . potassium chloride SA  40 mEq Oral Daily  . predniSONE  4 mg Oral Daily  . spironolactone  25 mg Oral Daily    Recommendations: Doxycycline 100 mg po twice a day and ertapenem 1 gram daily for 6 weeks through June 5th Will consider suppressive therapy for 6 months with doxycycline and a cephalosporin or comparable  we will arrange follow up in our office Weekly labs per home health protocol for ertapenem ESR, CRP every 2 weeks  Assessment: She has a possible recurrent infection with an area of concern on the tibia, elevated WBCs in synovial fluid and elevated CRP, though also with RA.  Not a definitive infection, but with her comorbidities with RA, retained tibial area, will treat with 6 weeks of IV therapy followed by oral continuation therapy for 6 weeks.   Antibiotics: doxycycline  HPI: [name] is a 81 y.o. female with RA, PVD, hypothyroidsim, afib on coumadin who presented for reimplantation of her TKA on the left.  She initially underwent left TKA in April 9326 and some complications with cellulitis treated with clindamycin and then developed left knee pain, concerned for infection and underwent I and D with polyexchange in August 2016.  She completed  6 weeks of IV therapy with vancomycin and ceftriaxone and continue with doxycycline and cephalexin.  She did have some diarrhea but C diff was negative and it resolved once cephalexin was stopped. In December, 2016, aspiration was done and underwent resection arthroplasty with antibiotic spacer placement.  At that time, her tibial component could not safely be removed due to cracking so was left in place. I then placed her on vancomycin and ceftriaxone for 28 days and then transitioned to oral therapy.  She then presented now for reimplantation.   OP report reviewed  Review of Systems:  Constitutional: negative for fevers and chills Gastrointestinal: negative for diarrhea Musculoskeletal: positive for myalgias and arthralgias All other systems reviewed and are negative   Past Medical History  Diagnosis Date  . Ulcerative colitis ([place])   . Hypothyroidism   . Hypertension     pcp   dr reed   [facility]  . Pneumonia 2008  . Chronic bronchitis ([place]) 10/31/2011    "prone to it; I have sinus/bronchitis problem probably q yr"  . Multiple skin tears 10/31/2011    "get them very easily; my skin is thin and I bruise easily"  . Chronic back pain     "my entire spine"  . Edema   . Other and unspecified hyperlipidemia   . Encounter for  long-term (current) use of other medications   . Senile osteoporosis   . Spinal stenosis, unspecified region other than cervical   . Unspecified glaucoma   . Hypothyroidism   . Anxiety     sometimes   . Swelling of right upper extremity 11/26/2014  . Diarrhea 01/27/2015  . Peripheral vascular disease ([place])   . Depression   . Anemia   . History of blood transfusion   . Hard of hearing   . Dizziness   . Falls   . H/O clavicle fracture     left  . Superficial thrombophlebitis 04/20/2015  . Complication of anesthesia 2009    htn with block- for hand surgery , pt thinks anesthesia causing some long term confusion  . Dysrhythmia     pt states bring off  coumadin for paroxysmal atrial fibrillation for last month  . Rheumatoid arthritis(714.0)     degenerative disc disease , ra  . Bruise     right lower extremity with opsite on    Social History  Substance Use Topics  . Smoking status: Current Every Day Smoker -- 0.25 packs/day for 50 years    Types: Cigarettes    Last Attempt to Quit: 02/20/2015  . Smokeless tobacco: Never Used     Comment: one a day, i dont inhale per pt  . Alcohol Use: No    Family History  Problem Relation Age of Onset  . Heart disease Mother   . Cancer Father     lung    No Known Allergies  Physical Exam: Constitutional: in no apparent distress and alert , tearful  Filed Vitals:   07/06/15 0655 07/06/15 0900  BP: 113/60 122/62  Pulse: 57 61  Temp: 97.8 F (36.6 C) 98.1 F (36.7 C)  Resp: 16 16   EYES: anicteric ENMT: no thrush Cardiovascular: Cor RRR Respiratory: CTA B; normal respiratory effort GI: Bowel sounds are normal, liver is not enlarged, spleen is not enlarged Musculoskeletal: no pedal edema noted, hands with swan neck deformity c/w ra Skin: negatives: no rash Neuro: non focal  Lab Results  Component Value Date   WBC 10.5 07/06/2015   HGB 10.4* 07/06/2015   HCT 32.5* 07/06/2015   MCV 84.0 07/06/2015   PLT 204 07/06/2015    Lab Results  Component Value Date   CREATININE 1.05* 07/06/2015   BUN 27* 07/06/2015   NA 141 07/06/2015   K 5.1 07/06/2015   CL 109 07/06/2015   CO2 23 07/06/2015    Lab Results  Component Value Date   ALT 15 12/25/2014   AST 41* 12/25/2014   ALKPHOS 114 12/25/2014     Microbiology: Recent Results (from the past 240 hour(s))  Surgical pcr screen     Status: None   Collection Time: 06/28/15  2:52 PM  Result Value Ref Range Status   MRSA, PCR NEGATIVE NEGATIVE Final   Staphylococcus aureus NEGATIVE NEGATIVE Final    Comment:        The Xpert SA Assay (FDA approved for NASAL specimens in patients over 51 years of age), is one component  of a comprehensive surveillance program.  Test performance has been validated by [facility] for patients greater than or equal to 84 year old. It is not intended to diagnose infection nor to guide or monitor treatment.   Body fluid culture     Status: None (Preliminary result)   Collection Time: 07/05/15  2:28 PM  Result Value Ref Range Status   Specimen Description  SYNOVIAL FLUID LEFT KNEE  Final   Special Requests FLUID ON SWAB  Final   Gram Stain   Final    ABUNDANT WBC PRESENT, PREDOMINANTLY PMN NO ORGANISMS SEEN    Culture   Final    NO GROWTH < 12 HOURS Performed at [facility]    Report Status PENDING  Incomplete  Anaerobic culture     Status: None (Preliminary result)   Collection Time: 07/05/15  2:44 PM  Result Value Ref Range Status   Specimen Description ABSCESS  Final   Special Requests NONE  Final   Gram Stain   Final    MODERATE WBC PRESENT,BOTH PMN AND MONONUCLEAR NO SQUAMOUS EPITHELIAL CELLS SEEN NO ORGANISMS SEEN Performed at [facility]    Culture PENDING  Incomplete   Report Status PENDING  Incomplete    Quandre Polinski, Herbie [place], [place] for Infectious Disease [employer] [facility] [email or web address] [phone] pager  [phone] cell 07/06/2015, 4:46 PM

## 2015-07-06 NOTE — Progress Notes (Signed)
Cardizem drip stopped at 02:20 per order from Dr Philbert Riser, due to patient's heart rate being in the 50's and converting to NSR.  Will continue to monitor patient.

## 2015-07-06 NOTE — Progress Notes (Signed)
   Patient Name: [name] Date of Encounter: 07/06/2015   SUBJECTIVE  No chest pain or sob.   CURRENT MEDS . aspirin EC  325 mg Oral BID  . balsalazide  2,250 mg Oral BID  . celecoxib  200 mg Oral Q12H  . dexamethasone  10 mg Intravenous Once  . doxycycline  100 mg Oral BID  . feeding supplement  1 Container Oral TID BM  . ferrous sulfate  325 mg Oral TID PC  . furosemide  40 mg Oral Daily  . latanoprost  1 drop Both Eyes QHS  . levothyroxine  88 mcg Oral QAC breakfast  . medroxyPROGESTERone  2.5 mg Oral QHS  . metoprolol succinate  25 mg Oral BID  . oxyCODONE  10-20 mg Oral Q4H  . potassium chloride SA  40 mEq Oral Daily  . predniSONE  4 mg Oral Daily  . spironolactone  25 mg Oral Daily    OBJECTIVE  Filed Vitals:   07/05/15 2157 07/06/15 0158 07/06/15 0655 07/06/15 0900  BP: 134/85 98/59 113/60 122/62  Pulse: 131 57 57 61  Temp: 97.8 F (36.6 C) 97.9 F (36.6 C) 97.8 F (36.6 C) 98.1 F (36.7 C)  TempSrc: Oral Oral Oral Oral  Resp: 16 16 16 16   Height:      Weight:      SpO2: 100% 100% 100% 100%    Intake/Output Summary (Last 24 hours) at 07/06/15 1055 Last data filed at 07/06/15 0704  Gross per 24 hour  Intake 3028.17 ml  Output    800 ml  Net 2228.17 ml   Filed Weights   07/05/15 1120  Weight: 125 lb 8 oz (56.926 kg)    PHYSICAL EXAM  General: Pleasant, NAD. Neuro: Alert and oriented X 3. Moves all extremities spontaneously. Psych: Normal affect. HEENT:  Normal  Neck: Supple without bruits or JVD. Lungs:  Resp regular and unlabored, CTA. Heart: RRR no s3, s4, or murmurs. Abdomen: Soft, non-tender, non-distended, BS + x 4.  Extremities: No clubbing, cyanosis or edema. DP/PT/Radials 2+ and equal bilaterally.  Accessory Clinical Findings  CBC  Recent Labs  07/06/15 0508  WBC 10.5  HGB 10.4*  HCT 32.5*  MCV 84.0  PLT 0000000   Basic Metabolic Panel  Recent Labs  07/06/15 0508  NA 141  K 5.1  CL 109  CO2 23  GLUCOSE 135*    BUN 27*  CREATININE 1.05*  CALCIUM 8.6*    TELE  Sinus rhythm at rate of 70s  Radiology/Studies  No results found.  ASSESSMENT AND PLAN  1. Atrial flutter with RVR: Unknown duration, last normal EKG dated December 2016 - The patient converted to sinus rhythm around 2am. Stopped cardizem drip. Maintaining sinus rhythm. Currently on home dose of Troprol XL 25mg . Titrate further once stable BP. Previously on Warfarin for anticoagulation. Plan to change Eliquis. Will review dose with MD. Currently on asa 325mg  BID, switch to Eliquis when ok with surgery.  - Note echocardiogram obtained on 04/17/2014 EF [id number], grade 1 diastolic dysfunction, normal left atrial size.  2. H/o PAF  3. Reimplantation of left TKA with removal of antibiotic spacer   Signed, Acelyn Basham PA-C Pager [phone]

## 2015-07-06 NOTE — Progress Notes (Signed)
BRIEF NUTRITION NOTE:  Pt seen for MST. Per chart, pt weight has fluctuated between 125-131 since 12/16. Pt denies weight loss, N/V, and abdominal pain associated with eating. Pt reports eating TID PTA. Pt reports eating smaller meals. Pt states she eats protein bars at home but no other nutritional supplements. Pt reports having prostat during hospital stay. However, no prostat is ordered but Boost Breeze is ordered TID. Intern asked pt about mobility PTA and pt reports not walking much since surgery in December. Pt has not experienced weight loss or a decrease in appetite. At this point, no nutritional interventions are warranted. Please consult if nutrition issues arise.  Geoffery Lyons, [place] [facility] Dietetic Intern Pager [phone]

## 2015-07-07 DIAGNOSIS — E440 Moderate protein-calorie malnutrition: Secondary | ICD-10-CM | POA: Insufficient documentation

## 2015-07-07 LAB — COMPREHENSIVE METABOLIC PANEL
ALBUMIN: 2.5 g/dL — AB (ref 3.5–5.0)
ALK PHOS: 153 U/L — AB (ref 38–126)
ALT: 7 U/L — AB (ref 14–54)
ANION GAP: 8 (ref 5–15)
AST: 22 U/L (ref 15–41)
BILIRUBIN TOTAL: 0.4 mg/dL (ref 0.3–1.2)
BUN: 38 mg/dL — ABNORMAL HIGH (ref 6–20)
CO2: 23 mmol/L (ref 22–32)
Calcium: 8.3 mg/dL — ABNORMAL LOW (ref 8.9–10.3)
Chloride: 112 mmol/L — ABNORMAL HIGH (ref 101–111)
Creatinine, Ser: 1.24 mg/dL — ABNORMAL HIGH (ref 0.44–1.00)
GFR calc Af Amer: 47 mL/min — ABNORMAL LOW (ref >60)
GFR calc non Af Amer: 41 mL/min — ABNORMAL LOW (ref >60)
GLUCOSE: 105 mg/dL — AB (ref 65–99)
POTASSIUM: 4.9 mmol/L (ref 3.5–5.1)
SODIUM: 143 mmol/L (ref 135–145)
Total Protein: 5 g/dL — ABNORMAL LOW (ref 6.5–8.1)

## 2015-07-07 LAB — CBC
HEMATOCRIT: 29.3 % — AB (ref 36.0–46.0)
HEMOGLOBIN: 9.6 g/dL — AB (ref 12.0–15.0)
MCH: 27.9 pg (ref 26.0–34.0)
MCHC: 32.8 g/dL (ref 30.0–36.0)
MCV: 85.2 fL (ref 78.0–100.0)
Platelets: 203 10*3/uL (ref 150–400)
RBC: 3.44 MIL/uL — AB (ref 3.87–5.11)
RDW: 16.6 % — ABNORMAL HIGH (ref 11.5–15.5)
WBC: 11.6 10*3/uL — ABNORMAL HIGH (ref 4.0–10.5)

## 2015-07-07 MED ORDER — BOOST / RESOURCE BREEZE PO LIQD
1.0000 | Freq: Two times a day (BID) | ORAL | Status: DC
  Administered 2015-07-08 (×2): 1 via ORAL

## 2015-07-07 MED ORDER — FERROUS SULFATE 325 (65 FE) MG PO TABS
325.0000 mg | ORAL_TABLET | Freq: Two times a day (BID) | ORAL | Status: DC
  Administered 2015-07-08 (×2): 325 mg via ORAL
  Filled 2015-07-07 (×2): qty 1

## 2015-07-07 MED ORDER — SODIUM CHLORIDE 0.9% FLUSH: 10.0000 mL | INTRAVENOUS | Status: DC | PRN

## 2015-07-07 MED ORDER — APIXABAN 5 MG PO TABS
5.0000 mg | ORAL_TABLET | Freq: Two times a day (BID) | ORAL | Status: DC
  Administered 2015-07-07 – 2015-07-09 (×4): 5 mg via ORAL
  Filled 2015-07-07 (×4): qty 1

## 2015-07-07 MED ORDER — BOOST PLUS PO LIQD
237.0000 mL | Freq: Two times a day (BID) | ORAL | Status: DC
  Filled 2015-07-07 (×5): qty 237

## 2015-07-07 NOTE — Progress Notes (Signed)
Peripherally Inserted Central Catheter/Midline Placement  The IV Nurse has discussed with the patient and/or persons authorized to consent for the patient, the purpose of this procedure and the potential benefits and risks involved with this procedure.  The benefits include less needle sticks, lab draws from the catheter and patient may be discharged home with the catheter.  Risks include, but not limited to, infection, bleeding, blood clot (thrombus formation), and puncture of an artery; nerve damage and irregular heat beat.  Alternatives to this procedure were also discussed.  Consent obtained by Claretha Cooper, RN  PICC/Midline Placement Documentation         Jd Mccaster, Nicolette Bang 07/07/2015, 12:07 PM

## 2015-07-07 NOTE — Progress Notes (Signed)
     Subjective: 2 Days Post-Op Procedure(s) (LRB): REIMPLANTATION OF LEFT TOTAL KNEE WITH REMOVAL OF ANTIBIOTIC SPACER (Left)   Patient reports pain as moderate, at times the pain spikes.  Hasn't worked with therapy yet.  No events throughout the night.  We have discussed getting a PICC line.  Objective:   VITALS:   Filed Vitals:   07/06/15 2105 07/07/15 0433  BP: 134/57 125/51  Pulse: 62 65  Temp: 98.1 F (36.7 C) 97.5 F (36.4 C)  Resp: 16 18    Dorsiflexion/Plantar flexion intact Incision: dressing C/D/I No cellulitis present Compartment soft  LABS  Recent Labs  07/06/15 0508 07/07/15 0457  HGB 10.4* 9.6*  HCT 32.5* 29.3*  WBC 10.5 11.6*  PLT 204 203     Recent Labs  07/06/15 0508 07/07/15 0457  NA 141 143  K 5.1 4.9  BUN 27* 38*  CREATININE 1.05* 1.24*  GLUCOSE 135* 105*     Assessment/Plan: 2 Days Post-Op Procedure(s) (LRB): REIMPLANTATION OF LEFT TOTAL KNEE WITH REMOVAL OF ANTIBIOTIC SPACER (Left)  Up with therapy Discharge to SNF upon discharge   [place]. Adriann Thau   PAC  07/07/2015, 9:45 AM

## 2015-07-07 NOTE — [place] FL2 (Signed)
[place] MEDICAID FL2 LEVEL OF CARE SCREENING TOOL     IDENTIFICATION  Patient Name: [name] Birthdate: [birth year] Sex: female Admission Date (Current Location): 07/05/2015  [facility] and [place] Number:  Herbalist and Address:  [facility],  [place] [address], [place]      Provider Number: [id number]  Attending Physician Name and Address:  Paralee Cancel, MD  Relative Name and Phone Number:       Current Level of Care: Hospital Recommended Level of Care: [place] Prior Approval Number:    Date Approved/Denied:   PASRR Number: [id number]  Discharge Plan: SNF    Current Diagnoses: Patient Active Problem List   Diagnosis Date Noted  . Malnutrition of moderate degree 07/07/2015  . S/P left TK reimplantation 07/05/2015  . S/P revision of total knee 07/05/2015  . Persistent atrial fibrillation ([place])   . Superficial thrombophlebitis 04/20/2015  . Left TK resection / spacer 03/01/2015  . Diarrhea 01/27/2015  . Atrial fibrillation ([place]) [I48.91] 12/14/2014  . Long term (current) use of anticoagulants [Z79.01] 12/14/2014  . Adhesive capsulitis of right shoulder 12/10/2014  . Swelling of right upper extremity 11/26/2014  . Acute on chronic diastolic CHF (congestive heart failure), NYHA class 1 ([place]) 10/29/2014  . Cellulitis 10/29/2014  . Anemia, iron deficiency 10/29/2014  . Infection of prosthetic left knee joint ([place]) 10/13/2014  . Benign essential HTN   . S/P left TKA 06/16/2014  . Cervical spondylosis with myelopathy 02/17/2014  . Chronic venous stasis dermatitis of both lower extremities 09/08/2013  . Lumbar stenosis with neurogenic claudication 08/05/2013  . Hypothyroidism   . Rheumatoid arthritis ([place]) 11/22/2011  . Ulcerative colitis, chronic ([place]) 11/22/2011    Orientation RESPIRATION BLADDER Height & Weight     Self, Time, Situation, Place  Normal Continent Weight: 125 lb 8 oz (56.926 kg) Height:  5'  6.5" (168.9 cm)  BEHAVIORAL SYMPTOMS/MOOD NEUROLOGICAL BOWEL NUTRITION STATUS  Other (Comment) (No Behaviors)   Continent Diet (Diet Regular)  AMBULATORY STATUS COMMUNICATION OF NEEDS Skin   Limited Assist Verbally Surgical wounds                       Personal Care Assistance Level of Assistance  Bathing, Feeding, Dressing Bathing Assistance: Limited assistance Feeding assistance: Independent Dressing Assistance: Limited assistance     Functional Limitations Info  Sight, Hearing, Speech Sight Info: Adequate Hearing Info: Impaired Speech Info: Adequate    SPECIAL CARE FACTORS FREQUENCY  PT (By licensed PT), OT (By licensed OT)     PT Frequency: 5 x a week OT Frequency: 5 x a week            Contractures Contractures Info: Not present    Additional Factors Info  Code Status Code Status Info: FULL code status             Current Medications (07/07/2015):  This is the current hospital active medication list Current Facility-Administered Medications  Medication Dose Route Frequency Provider Last Rate Last Dose  . alum & mag hydroxide-simeth (MAALOX/MYLANTA) 200-200-20 MG/5ML suspension 30 mL  30 mL Oral Q4H PRN Danae Orleans, PA-C      . apixaban Arne Cleveland) tablet 5 mg  5 mg Oral BID Danae Orleans, PA-C      . balsalazide (COLAZAL) capsule 2,250 mg  2,250 mg Oral BID Danae Orleans, PA-C   2,250 mg at 07/07/15 1104  . bisacodyl (DULCOLAX) suppository 10 mg  10 mg Rectal  Daily PRN Danae Orleans, PA-C      . celecoxib (CELEBREX) capsule 200 mg  200 mg Oral Q12H Danae Orleans, PA-C   200 mg at 07/07/15 1108  . diazepam (VALIUM) tablet 5 mg  5 mg Oral Q6H PRN Danae Orleans, PA-C   5 mg at 07/06/15 1841  . diltiazem (CARDIZEM) 100 mg in dextrose 5 % 100 mL (1 mg/mL) infusion  5-15 mg/hr Intravenous Titrated Pixie Casino, MD   Stopped at 07/06/15 0221  . diphenhydrAMINE (BENADRYL) capsule 25 mg  25 mg Oral Q6H PRN Danae Orleans, PA-C      . docusate sodium  (COLACE) capsule 100 mg  100 mg Oral BID PRN Danae Orleans, PA-C      . doxycycline (VIBRA-TABS) tablet 100 mg  100 mg Oral BID Danae Orleans, PA-C   100 mg at 07/07/15 1107  . ertapenem (INVANZ) 1 g in sodium chloride 0.9 % 50 mL IVPB  1 g Intravenous Q24H Thayer Headings, MD   1 g at 07/06/15 1840  . feeding supplement (BOOST / RESOURCE BREEZE) liquid 1 Container  1 Container Oral BID BM Paralee Cancel, MD      . ferrous sulfate tablet 325 mg  325 mg Oral TID PC Danae Orleans, PA-C   325 mg at 07/07/15 1107  . furosemide (LASIX) tablet 40 mg  40 mg Oral Daily Paralee Cancel, MD   40 mg at 07/07/15 1107  . HYDROmorphone (DILAUDID) injection 0.5-2 mg  0.5-2 mg Intravenous Q2H PRN Danae Orleans, PA-C      . lactose free nutrition (BOOST PLUS) liquid 237 mL  237 mL Oral BID BM Paralee Cancel, MD      . latanoprost (XALATAN) 0.005 % ophthalmic solution 1 drop  1 drop Both Eyes QHS Danae Orleans, PA-C   1 drop at 07/06/15 2225  . levothyroxine (SYNTHROID, LEVOTHROID) tablet 88 mcg  88 mcg Oral QAC breakfast Danae Orleans, PA-C   88 mcg at 07/07/15 [id number]  . magnesium citrate solution 1 Bottle  1 Bottle Oral Once PRN Danae Orleans, PA-C      . medroxyPROGESTERone (PROVERA) tablet 2.5 mg  2.5 mg Oral QHS Danae Orleans, PA-C   2.5 mg at 07/06/15 2223  . menthol-cetylpyridinium (CEPACOL) lozenge 3 mg  1 lozenge Oral PRN Danae Orleans, PA-C   1 lozenge at 07/06/15 0142   Or  . phenol (CHLORASEPTIC) mouth spray 1 spray  1 spray Mouth/Throat PRN Danae Orleans, PA-C      . methocarbamol (ROBAXIN) tablet 500 mg  500 mg Oral Q6H PRN Danae Orleans, PA-C   500 mg at 07/06/15 2223   Or  . methocarbamol (ROBAXIN) 500 mg in dextrose 5 % 50 mL IVPB  500 mg Intravenous Q6H PRN Danae Orleans, PA-C   500 mg at 07/05/15 1658  . metoCLOPramide (REGLAN) tablet 5-10 mg  5-10 mg Oral Q8H PRN Danae Orleans, PA-C       Or  . metoCLOPramide (REGLAN) injection 5-10 mg  5-10 mg Intravenous Q8H PRN Danae Orleans, PA-C       . metoprolol succinate (TOPROL-XL) 24 hr tablet 25 mg  25 mg Oral BID Danae Orleans, PA-C   25 mg at 07/07/15 1106  . ondansetron (ZOFRAN) tablet 4 mg  4 mg Oral Q6H PRN Danae Orleans, PA-C       Or  . ondansetron [facility]) injection 4 mg  4 mg Intravenous Q6H PRN Danae Orleans, PA-C      . oxyCODONE (Oxy IR/ROXICODONE) immediate  release tablet 10-20 mg  10-20 mg Oral Q4H Danae Orleans, PA-C   10 mg at 07/07/15 [id number]  . polyethylene glycol (MIRALAX / GLYCOLAX) packet 17 g  17 g Oral BID PRN Danae Orleans, PA-C      . potassium chloride SA (K-DUR,KLOR-CON) CR tablet 40 mEq  40 mEq Oral Daily Danae Orleans, PA-C   40 mEq at 07/07/15 1107  . predniSONE (DELTASONE) tablet 4 mg  4 mg Oral Daily Danae Orleans, PA-C   4 mg at 07/07/15 1105  . sodium chloride flush (NS) 0.9 % injection 10-40 mL  10-40 mL Intracatheter PRN Paralee Cancel, MD      . spironolactone (ALDACTONE) tablet 25 mg  25 mg Oral Daily Danae Orleans, PA-C   25 mg at 07/07/15 1108     Discharge Medications: Please see discharge summary for a list of discharge medications.  Relevant Imaging Results:  Relevant Lab Results:   Additional Information SS # [id number] Pt to have PICC line for 6 weeks of IV antibiotics.   KIDD, SUZANNA A, LCSW

## 2015-07-07 NOTE — Progress Notes (Signed)
Physical Therapy Treatment Patient Details Name: [name] MRN: [id number] DOB: 1935-01-06 Today's Date: 07/07/2015    History of Present Illness 81 yo female s/p L total knee reimplantation, removal of spacer 07/05/15. Hx of RA, back surgery.    PT Comments    POD # 2  Assisted OOB to [facility] to void.  Pt required + 2 assist for safety and hygiene.  Unable to take functional steps today.  Assisted back to bed to perform TKR TE's followed by ICE.    Follow Up Recommendations  SNF     Equipment Recommendations       Recommendations for Other Services       Precautions / Restrictions Precautions Precautions: Fall;Knee Precaution Comments: expressive aphasia Restrictions Weight Bearing Restrictions: No LLE Weight Bearing: Weight bearing as tolerated    Mobility  Bed Mobility Overal bed mobility: Needs Assistance Bed Mobility: Supine to Sit;Sit to Supine     Supine to sit: Mod assist Sit to supine: Mod assist   General bed mobility comments: increased time, support L LE and use of bed pad to complete scooting  Transfers Overall transfer level: Needs assistance Equipment used: None Transfers: Stand Pivot Transfers Sit to Stand: Mod assist         General transfer comment: transfer to [facility] 1/4 pivot turn with 75% VC's on proper hand transition and turn completion.  Assisted back to bed stand pivot required + 2 assist for safety.   Ambulation/Gait                 Stairs            Wheelchair Mobility    Modified Rankin (Stroke Patients Only)       Balance                                    Cognition Arousal/Alertness: Awake/alert                          Exercises   Total Knee Replacement TE's 10 reps B LE ankle pumps 10 reps towel squeezes 10 reps knee presses 10 reps heel slides  10 reps SAQ's 10 reps SLR's 10 reps ABD Followed by ICE     General Comments        Pertinent Vitals/Pain Pain  Assessment: Faces Faces Pain Scale: Hurts little more Pain Location: L knee Pain Descriptors / Indicators: Aching;Sore Pain Intervention(s): Monitored during session;Repositioned;Ice applied    Home Living                      Prior Function            PT Goals (current goals can now be found in the care plan section) Progress towards PT goals: Progressing toward goals    Frequency  7X/week    PT Plan Current plan remains appropriate    Co-evaluation             End of Session Equipment Utilized During Treatment: Gait belt Activity Tolerance: Patient limited by fatigue;Patient limited by pain Patient left: in bed;with call bell/phone within reach;with bed alarm set     Time: 1515-1540 PT Time Calculation (min) (ACUTE ONLY): 25 min  Charges:  $Therapeutic Exercise: 8-22 mins $Therapeutic Activity: 8-22 mins  G Codes:      Rica Koyanagi  PTA [facility]  Acute  Rehab Pager      [phone]

## 2015-07-07 NOTE — Progress Notes (Signed)
Initial Nutrition Assessment  DOCUMENTATION CODES:   Non-severe (moderate) malnutrition in context of acute illness/injury  INTERVENTION:  - Boost Breeze BID, each supplement provides 250 kcal and 9 grams of protein - Boost Plus BID, each supplement provides 360 kcal and 14 grams of protein. - Continue to encourage PO intake of meals and supplements - RD will continue to monitor for additional needs  NUTRITION DIAGNOSIS:   Inadequate oral intake related to poor appetite as evidenced by per patient/family report.  GOAL:   Patient will meet greater than or equal to 90% of their needs  MONITOR:   PO intake, Supplement acceptance, Weight trends, Labs, Skin, I & O's  REASON FOR ASSESSMENT:   Consult Assessment of nutrition requirement/status  ASSESSMENT:   [name]  has presented today for surgery, with the diagnosis of status post resection of left total knee with placement of antibiotic spacer  The various methods of treatment have been discussed with the patient and family. After consideration of risks, benefits and other options for treatment, the patient has consented to  Procedure(s): REIMPLANTATION OF LEFT TOTAL KNEE WITH REMOVAL OF ANTIBIOTIC SPACER (Left) as a surgical intervention .   Pt seen for consult. BMI indicates normal weight. Pt reports that for breakfast this AM she ate [id number] of oatmeal, 2 slices of bacon, scrambled eggs, orange juice, and coffee. Pt reports that this is more than she typically eats per meal at home. Pt reports that she has had a decreased appetite but RD unable to obtain time frame for this change despite several attempts to ask the question in different ways. Pt states that at home she was consuming protein bars but she is unable to recall the name of the bars. She states that since admission she has tried [employer], [employer], and Prostat and likes the Boost products but does not like Prostat. Pt states that she has had progressive  difficulty with swallowing which she relates to RA and states that taking medications in applesauce greatly helps. She states that her husband feels that the reason foods are difficult to swallow are because she does not chew thoroughly but pt feels that chewing more thoroughly is a waste of her time stating "I could be doing dishes or something else rather than chewing 32 times."   She states that due to decreased appetite and associated decreased intakes, stating "I eat enough to survive each day," she has lost 11-18 lbs in the past 2-3 months. Chart review indicates that pt has lost 8 lbs (6% body weight) in the past 2 months which is significant for time frame. Also of note, current weight is consistent with weight from 01/11/15-03/01/15 (122-128 lbs). Pt does indicate that she has experienced L leg swelling recently; question if some of her weight fluctuation has been related to the same. Physical assessment to upper body shows mild fat and moderate muscle wasting, pt requested lower body not been assessed at time of visit.   Pt reports hx of ulcerative colitis and that she was previously taking medication for this but was subsequently taken off the medication as it made her RA worse. Pt states that she avoids fresh fruit and spicy items as these cause flares in UC.   Pt likely not meeting needs PTA. Will monitor intakes of meals and supplements and weight trends during admission and provide further intervention as warranted. Medications reviewed; 40 mg oral Lasix/day, PRN Reglan, 40 mEq oral KCl/day, 25 mg oral Aldactone/day. Labs reviewed; Cl:  112 mmol/L, BUN/creatinine elevated and trending up, Ca: 8.3 mg/dL, GFR: 41.  Albumin: 2.5 g/dL with CRP: 4.5 mg/dL.   Diet Order:  Diet regular Room service appropriate?: Yes; Fluid consistency:: Thin  Skin:  Wound (see comment) (L knee incision)  Last BM:  4/25  Height:   Ht Readings from Last 1 Encounters:  07/05/15 5' 6.5" (1.689 m)    Weight:    Wt Readings from Last 1 Encounters:  07/05/15 125 lb 8 oz (56.926 kg)    Ideal Body Weight:  60.23 kg (kg)  BMI:  Body mass index is 19.95 kg/(m^2).  Estimated Nutritional Needs:   Kcal:  1540-1710 (27-30 kcal/kg)  Protein:  68-80 grams (1.2-1.4 grams/kg)  Fluid:  >/= 1.5 L/day  EDUCATION NEEDS:   No education needs identified at this time     Jarome Matin, RD, LDN Inpatient Clinical Dietitian Pager # [phone] After hours/weekend pager # [phone]

## 2015-07-08 MED ORDER — SODIUM CHLORIDE 0.9 % IV SOLN: 1.0000 g | INTRAVENOUS | Status: DC

## 2015-07-08 MED ORDER — DOXYCYCLINE HYCLATE 100 MG PO TABS: 100.0000 mg | ORAL_TABLET | Freq: Two times a day (BID) | ORAL | Status: DC

## 2015-07-08 MED ORDER — OXYCODONE HCL 10 MG PO TABS: 10.0000 mg | ORAL_TABLET | ORAL | Status: DC | PRN

## 2015-07-08 MED ORDER — BOOST / RESOURCE BREEZE PO LIQD: 1.0000 | Freq: Two times a day (BID) | ORAL | Status: DC

## 2015-07-08 MED ORDER — BOOST PLUS PO LIQD: 237.0000 mL | Freq: Two times a day (BID) | ORAL | Status: DC

## 2015-07-08 MED ORDER — FERROUS SULFATE 325 (65 FE) MG PO TABS: 325.0000 mg | ORAL_TABLET | Freq: Two times a day (BID) | ORAL | Status: DC

## 2015-07-08 MED ORDER — APIXABAN 5 MG PO TABS: 5.0000 mg | ORAL_TABLET | Freq: Two times a day (BID) | ORAL | Status: DC

## 2015-07-08 MED ORDER — SODIUM CHLORIDE 0.9 % IV SOLN: 1.0000 g | INTRAVENOUS | Status: AC

## 2015-07-08 MED ORDER — DIAZEPAM 5 MG PO TABS: 5.0000 mg | ORAL_TABLET | Freq: Four times a day (QID) | ORAL | Status: DC | PRN

## 2015-07-08 MED ORDER — DOCUSATE SODIUM 100 MG PO CAPS: 100.0000 mg | ORAL_CAPSULE | Freq: Two times a day (BID) | ORAL | Status: DC | PRN

## 2015-07-08 NOTE — Discharge Summary (Signed)
Physician Discharge Summary  Patient ID: [name] MRN: [id number] DOB/AGE: [birth year] 81 y.o.  Admit date: 07/05/2015 Discharge date:  07/09/2015    Procedures:  Procedure(s) (LRB): REIMPLANTATION OF LEFT TOTAL KNEE WITH REMOVAL OF ANTIBIOTIC SPACER (Left)  Attending Physician:  Dr. Paralee Cancel   Admission Diagnoses:   S/P resection of left TKA with placement of antibiotic spacer  Discharge Diagnoses:  Principal Problem:   S/P left TK reimplantation Active Problems:   S/P revision of total knee   Persistent atrial fibrillation (HCC)   Malnutrition of moderate degree  Past Medical History  Diagnosis Date  . Ulcerative colitis ([place])   . Hypothyroidism   . Hypertension     pcp   dr reed   [facility]  . Pneumonia 2008  . Chronic bronchitis ([place]) 10/31/2011    "prone to it; I have sinus/bronchitis problem probably q yr"  . Multiple skin tears 10/31/2011    "get them very easily; my skin is thin and I bruise easily"  . Chronic back pain     "my entire spine"  . Edema   . Other and unspecified hyperlipidemia   . Encounter for long-term (current) use of other medications   . Senile osteoporosis   . Spinal stenosis, unspecified region other than cervical   . Unspecified glaucoma   . Hypothyroidism   . Anxiety     sometimes   . Swelling of right upper extremity 11/26/2014  . Diarrhea 01/27/2015  . Peripheral vascular disease ([place])   . Depression   . Anemia   . History of blood transfusion   . Hard of hearing   . Dizziness   . Falls   . H/O clavicle fracture     left  . Superficial thrombophlebitis 04/20/2015  . Complication of anesthesia 2009    htn with block- for hand surgery , pt thinks anesthesia causing some long term confusion  . Dysrhythmia     pt states bring off coumadin for paroxysmal atrial fibrillation for last month  . Rheumatoid arthritis(714.0)     degenerative disc disease , ra  . Bruise     right lower extremity with opsite on     HPI:    Pt is a 81 y.o. female complaining of left knee pain. Patient is S/P resection of left TKA with placement of antibiotic spacer with a surgery scheduled for 07/05/2015 to reimplant the left TKA. Patient is very much looking forward to the surgery. Pt has previous procedure on the left knee including a TKA on 06/16/2014 and an I&D of the left TKA on 10/13/2014. She then had the left TKA resected and an antibiotic spacer placed on 03/01/2015 and subsequently placed on IV antibiotics. Various options are discussed with the patient. Risks, benefits and expectations were discussed with the patient. Patient understand the risks, benefits and expectations and wishes to proceed with surgery.   PCP: Hollace Kinnier, DO   Discharged Condition: good  Hospital Course:  Patient underwent the above stated procedure on 07/05/2015. Patient tolerated the procedure well and brought to the recovery room in good condition and subsequently to the floor.  POD #1 BP: 122/62 ; Pulse: 61 ; Temp: 98.1 F (36.7 C) ; Resp: 16 Patient reports pain as moderate. Fairly anxious regarding prognosis which is understandable. No events as it pertains to her heart and monitoring. Neurovascular intact and incision: dressing C/D/I.  LABS  Basename    HGB     10.4  HCT  32.5   POD #2  BP: 125/51 ; Pulse: 65 ; Temp: 97.5 F (36.4 C) ; Resp: 18 Patient reports pain as moderate, at times the pain spikes. Hasn't worked with therapy yet. No events throughout the night. We have discussed getting a PICC line. Neurovascular intact and incision: dressing C/D/I.  LABS  Basename    HGB     9.6  HCT     29.3   POD #3  BP: 117/54 ; Pulse: 98 ; Temp: 97.4 F (36.3 C) ; Resp: 16 Patient reports pain as mild, pain controlled. She states that the pain is much better than it was yesterday. No other events throughout the night. Planning on skilled nursing facility upon discharge. Dorsiflexion/plantar flexion intact,  incision: dressing C/D/I, no cellulitis present and compartment soft.   LABS   No new labs  POD #4  BP: 112/59 ; Pulse: 72 ; Temp: 97.9 F (36.6 C) ; Resp: 16 Patient reports pain as mild, pain controlled. Feels that yesterday wasn't great in that she still doesn't have much mobility. We have discussed that mobility will come in time, but that it will be slow do to her history and medical history. Encouraged to work hard on trying to get strength and ROM in the knee. Ready to be discharged to SNF. Dorsiflexion/plantar flexion intact, incision: dressing C/D/I, no cellulitis present and compartment soft.   LABS   No new labs   Discharge Exam: General appearance: alert, cooperative and no distress Extremities: Homans sign is negative, no sign of DVT, no edema, redness or tenderness in the calves or thighs and no ulcers, gangrene or trophic changes  Disposition:    Skilled nursing facility with follow up in 2 weeks   Follow-up Information    Follow up with [facility] .   Specialty:  Skilled Nursing Facility   Contact information:   [place] [phone]      Follow up with Mauri Pole, MD. Schedule an appointment as soon as possible for a visit in 2 weeks.   Specialty:  Orthopedic Surgery   Contact information:   [address] [place] [address] [id number]       Discharge Instructions    Call MD / Call 911    Complete by:  As directed   If you experience chest pain or shortness of breath, CALL 911 and be transported to the hospital emergency room.  If you develope a fever above 101 F, pus (white drainage) or increased drainage or redness at the wound, or calf pain, call your surgeon's office.     Change dressing    Complete by:  As directed   Maintain surgical dressing until follow up in the clinic. If the edges start to pull up, may reinforce with tape. If the dressing is no  longer working, may remove and cover with gauze and tape, but must keep the area dry and clean.  Call with any questions or concerns.     Constipation Prevention    Complete by:  As directed   Drink plenty of fluids.  Prune juice may be helpful.  You may use a stool softener, such as Colace (over the counter) 100 mg twice a day.  Use MiraLax (over the counter) for constipation as needed.     Diet - low sodium heart healthy    Complete by:  As directed      Discharge instructions    Complete  by:  As directed   Maintain surgical dressing until follow up in the clinic. If the edges start to pull up, may reinforce with tape. If the dressing is no longer working, may remove and cover with gauze and tape, but must keep the area dry and clean.  Follow up in 2 weeks at [facility] Call with any questions or concerns.     Increase activity slowly as tolerated    Complete by:  As directed   Weight bearing as tolerated with assist device (walker, cane, etc) as directed, use it as long as suggested by your surgeon or therapist, typically at least 4-6 weeks.     TED hose    Complete by:  As directed   Use stockings (TED hose) for 2 weeks on both leg(s).  You may remove them at night for sleeping.             Medication List    TAKE these medications        apixaban 5 MG Tabs tablet  Commonly known as:  ELIQUIS  Take 1 tablet (5 mg total) by mouth 2 (two) times daily.     balsalazide 750 MG capsule  Commonly known as:  COLAZAL  Take 2,250 mg by mouth 2 (two) times daily.     Calcium + D3 600-200 MG-UNIT Tabs  Take 1 tablet by mouth daily with breakfast.     CELEBREX 200 MG capsule  Generic drug:  celecoxib  Take 200 mg by mouth 2 (two) times daily.     diazepam 5 MG tablet  Commonly known as:  VALIUM  Take 1 tablet (5 mg total) by mouth every 6 (six) hours as needed for muscle spasms.     docusate sodium 100 MG capsule  Commonly known as:  COLACE  Take 1 capsule (100 mg  total) by mouth 2 (two) times daily as needed for mild constipation or moderate constipation.     doxycycline 100 MG tablet  Commonly known as:  VIBRA-TABS  Take 1 tablet (100 mg total) by mouth 2 (two) times daily.     ertapenem 1 g in sodium chloride 0.9 % 50 mL  Inject 1 g into the vein daily.     estradiol 0.5 MG tablet  Commonly known as:  ESTRACE  Take 0.25 mg by mouth daily.     feeding supplement Liqd  Take 1 Container by mouth 2 (two) times daily between meals.     lactose free nutrition Liqd  Take 237 mLs by mouth 2 (two) times daily between meals.     ferrous sulfate 325 (65 FE) MG tablet  Take 1 tablet (325 mg total) by mouth 2 (two) times daily with a meal.     folic acid 1 MG tablet  Commonly known as:  FOLVITE  Take 2 mg by mouth daily.     furosemide 40 MG tablet  Commonly known as:  LASIX  Take 1 tablet (40 mg total) by mouth daily.     guaiFENesin 600 MG 12 hr tablet  Commonly known as:  MUCINEX  Take 600 mg by mouth daily as needed (sinus relief).     latanoprost 0.005 % ophthalmic solution  Commonly known as:  XALATAN  Place 1 drop into both eyes at bedtime.     medroxyPROGESTERone 2.5 MG tablet  Commonly known as:  PROVERA  Take 2.5 mg by mouth at bedtime.     metoprolol succinate 25 MG 24 hr tablet  Commonly  known as:  TOPROL-XL  Take 1 tablet (25 mg total) by mouth 2 (two) times daily. Take with or immediately following a meal.     multivitamin with minerals Tabs tablet  Take 1 tablet by mouth daily with lunch.     Oxycodone HCl 10 MG Tabs  Take 1-2 tablets (10-20 mg total) by mouth every 4 (four) hours as needed for severe pain.     polyethylene glycol packet  Commonly known as:  MIRALAX / GLYCOLAX  Take 17 g by mouth daily as needed for mild constipation.     potassium chloride SA 20 MEQ tablet  Commonly known as:  K-DUR,KLOR-CON  Take 2 tablets (40 mEq total) by mouth daily.     predniSONE 1 MG tablet  Commonly known as:   DELTASONE  Take 4 mg by mouth daily.     SENNA S PO  Take 1 tablet by mouth daily as needed (constipation.).     simethicone 125 MG chewable tablet  Commonly known as:  MYLICON  Chew 0000000 mg by mouth every 8 (eight) hours as needed.     spironolactone 25 MG tablet  Commonly known as:  ALDACTONE  Take 25 mg by mouth daily.     SYNTHROID 88 MCG tablet  Generic drug:  levothyroxine  Take 88 mcg by mouth daily before breakfast. DAW, DO NOT SUBSTITUTE WITH GENERIC     Vitamin D3 1000 units Caps  Take 1,000 Units by mouth daily.         Signed:  [place] Pugh. Deaven Urwin   PA-C  07/09/2015, 7:23 AM

## 2015-07-08 NOTE — Progress Notes (Signed)
CSW continuing to follow.   Pt plans to discharge to [facility] when medically ready.  CSW updated [facility] regarding need for PICC line for IV antibiotics and rehab.   Per report, plan is for pt to discharge to SNF tomorrow.   CSW to continue to follow.   Alison Murray, MSW, [place] [employer] [phone]

## 2015-07-08 NOTE — Progress Notes (Signed)
     Subjective: 3 Days Post-Op Procedure(s) (LRB): REIMPLANTATION OF LEFT TOTAL KNEE WITH REMOVAL OF ANTIBIOTIC SPACER (Left)   Patient reports pain as mild, pain controlled. She states that the pain is much better than it was yesterday.  No other events throughout the night.  Planning on skilled nursing facility upon discharge.  Objective:   VITALS:   Filed Vitals:   07/07/15 2117 07/08/15 0534  BP: 116/57 117/54  Pulse: 100 98  Temp: 97.2 F (36.2 C) 97.4 F (36.3 C)  Resp: 18 16    Dorsiflexion/Plantar flexion intact Incision: dressing C/D/I No cellulitis present Compartment soft  LABS  Recent Labs  07/06/15 0508 07/07/15 0457  HGB 10.4* 9.6*  HCT 32.5* 29.3*  WBC 10.5 11.6*  PLT 204 203     Recent Labs  07/06/15 0508 07/07/15 0457  NA 141 143  K 5.1 4.9  BUN 27* 38*  CREATININE 1.05* 1.24*  GLUCOSE 135* 105*     Assessment/Plan: 3 Days Post-Op Procedure(s) (LRB): REIMPLANTATION OF LEFT TOTAL KNEE WITH REMOVAL OF ANTIBIOTIC SPACER (Left)  Up with therapy Discharge to SNF eventually, upon discharge    West Pugh. Kendra Woolford   PAC  07/08/2015, 9:13 AM

## 2015-07-08 NOTE — Care Management Important Message (Signed)
Important Message  Patient Details  Name: GEISHA BOGENSCHUTZ MRN: [id number] Date of Birth: 03-06-1935   Medicare Important Message Given:  Yes    Camillo Flaming 07/08/2015, 10:33 AMImportant Message  Patient Details  Name: TOYNA DAHLQUIST MRN: [id number] Date of Birth: 12-Dec-1934   Medicare Important Message Given:  Yes    Camillo Flaming 07/08/2015, 10:33 AM

## 2015-07-08 NOTE — Progress Notes (Signed)
Physical Therapy Treatment Patient Details Name: [name] MRN: [id number] DOB: 02/04/1935 Today's Date: 07/08/2015    History of Present Illness 81 yo female s/p L total knee reimplantation, removal of spacer 07/05/15. Hx of RA, back surgery.    PT Comments    Progressing slowly with mobility. Limited by pain and fatigue on today. Continue to recommend SNF.   Follow Up Recommendations  SNF     Equipment Recommendations  None recommended by PT    Recommendations for Other Services       Precautions / Restrictions Precautions Precautions: Fall;Knee Restrictions Weight Bearing Restrictions: No LLE Weight Bearing: Weight bearing as tolerated    Mobility  Bed Mobility Overal bed mobility: Needs Assistance Bed Mobility: Supine to Sit     Supine to sit: Min assist;HOB elevated     General bed mobility comments: increased time. small amount of assist for trunk and L LE.   Transfers Overall transfer level: Needs assistance Equipment used: Rolling walker (2 wheeled) Transfers: Sit to/from Stand Sit to Stand: Mod assist Stand pivot transfers: Mod assist       General transfer comment: Assist to rise, stabilize, control descent. Multimodal cues for safety, hand/LE placement. Stand pivot, bed to bsc, with RW. Increased time and effort.   Ambulation/Gait Ambulation/Gait assistance: Mod assist Ambulation Distance (Feet): 3 Feet Assistive device: Rolling walker (2 wheeled) Gait Pattern/deviations: Step-to pattern;Trunk flexed;Antalgic;Decreased step length - left;Decreased step length - right     General Gait Details: Assist to support/stabilize pt. Pt had difficulty taking steps (attempt to slide/scoot feet instead of picking up). Fatigues very easily. Followed closely with recliner.    Stairs            Wheelchair Mobility    Modified Rankin (Stroke Patients Only)       Balance           Standing balance support: During functional  activity Standing balance-Leahy Scale: Fair                      Cognition Arousal/Alertness: Awake/alert Behavior During Therapy: WFL for tasks assessed/performed Overall Cognitive Status: Within Functional Limits for tasks assessed                      Exercises Total Joint Exercises Ankle Circles/Pumps: AROM;Both;10 reps;Supine Quad Sets: AROM;Both;10 reps;Supine Heel Slides: AAROM;Left;10 reps;Supine Hip ABduction/ADduction: AAROM;Left;10 reps;Supine Straight Leg Raises: AAROM;Left;10 reps;Supine Goniometric ROM: ~10-75 degrees    General Comments        Pertinent Vitals/Pain Pain Assessment: Faces Faces Pain Scale: Hurts even more Pain Location: L knee with activity Pain Descriptors / Indicators: Aching;Sore Pain Intervention(s): Monitored during session;Limited activity within patient's tolerance;Ice applied;Repositioned    Home Living                      Prior Function            PT Goals (current goals can now be found in the care plan section) Progress towards PT goals: Progressing toward goals (very slowly)    Frequency  7X/week    PT Plan Current plan remains appropriate    Co-evaluation             End of Session Equipment Utilized During Treatment: Gait belt Activity Tolerance: Patient limited by fatigue;Patient limited by pain Patient left: in chair;with call bell/phone within reach;with chair alarm set     Time: 1105-1140 PT Time Calculation (min) (ACUTE ONLY):  35 min  Charges:  $Gait Training: 8-22 mins $Therapeutic Exercise: 8-22 mins                    G Codes:      Weston Anna, MPT Pager: [phone]

## 2015-07-09 LAB — BODY FLUID CULTURE: CULTURE: NO GROWTH

## 2015-07-09 MED ORDER — HEPARIN SOD (PORK) LOCK FLUSH 100 UNIT/ML IV SOLN
250.0000 [IU] | INTRAVENOUS | Status: AC | PRN
  Administered 2015-07-09: 250 [IU]

## 2015-07-09 NOTE — Clinical Social Work Note (Signed)
Pt is ready for d/c.  CSW informed pt, pt's husband and facility of d/c plans.  Pt will be transported via EMS.  CSW provided facility information to RN in order to call report.  CSW signing off as there are no further needs at this time.  [name], [place]

## 2015-07-09 NOTE — Progress Notes (Signed)
     Subjective: 4 Days Post-Op Procedure(s) (LRB): REIMPLANTATION OF LEFT TOTAL KNEE WITH REMOVAL OF ANTIBIOTIC SPACER (Left)   Patient reports pain as mild, pain controlled.  Feels that yesterday wasn't great in that she still doesn't have much mobility.  We have discussed that mobility will come in time, but that it will be slow do to her history and medical history.  Encouraged to work hard on trying to get strength and ROM in the knee.  Ready to be discharged to SNF.  Objective:   VITALS:   Filed Vitals:   07/08/15 2117 07/09/15 0620  BP: 122/70 112/59  Pulse: 67 72  Temp: 97.8 F (36.6 C) 97.9 F (36.6 C)  Resp:  16    Dorsiflexion/Plantar flexion intact Incision: dressing C/D/I No cellulitis present Compartment soft  LABS  Recent Labs  07/07/15 0457  HGB 9.6*  HCT 29.3*  WBC 11.6*  PLT 203     Recent Labs  07/07/15 0457  NA 143  K 4.9  BUN 38*  CREATININE 1.24*  GLUCOSE 105*     Assessment/Plan: 4 Days Post-Op Procedure(s) (LRB): REIMPLANTATION OF LEFT TOTAL KNEE WITH REMOVAL OF ANTIBIOTIC SPACER (Left) Up with therapy Discharge to SNF  Follow up in 2 weeks at [facility]. Follow up with OLIN,Donisha Hoch D in 2 weeks.  Contact information:  [facility] [address], [address] [place] [name]   PAC  07/09/2015, 7:19 AM

## 2015-07-09 NOTE — Progress Notes (Signed)
Report called to RN at [place].  Lind Guest, RN

## 2015-07-10 LAB — ANAEROBIC CULTURE

## 2015-07-12 ENCOUNTER — Ambulatory Visit: Payer: Medicare Other | Admitting: Infectious Disease

## 2015-07-13 ENCOUNTER — Ambulatory Visit: Payer: Medicare Other | Admitting: Infectious Disease

## 2015-07-28 ENCOUNTER — Ambulatory Visit: Payer: Medicare Other | Admitting: Infectious Disease

## 2015-08-10 ENCOUNTER — Ambulatory Visit (INDEPENDENT_AMBULATORY_CARE_PROVIDER_SITE_OTHER): Payer: Medicare Other | Admitting: Internal Medicine

## 2015-08-10 VITALS — BP 117/74 | HR 80 | Temp 98.2°F

## 2015-08-10 DIAGNOSIS — R531 Weakness: Secondary | ICD-10-CM | POA: Diagnosis not present

## 2015-08-10 DIAGNOSIS — T8454XA Infection and inflammatory reaction due to internal left knee prosthesis, initial encounter: Secondary | ICD-10-CM

## 2015-08-10 NOTE — Assessment & Plan Note (Signed)
She seems to be doing well clinically so I don't feel the elevated WBC is representative of infeciton.  No true infection clearly identified but certainly was at high risk with WBCs, RA.  Now completing 6 weeks IV.  I will have her continue with oral continuation therapy for 3-6 months with doxycycline and cefuroxime, both twice a day and have her follow up with me in about 6 weeks.  Will consider stopping antibiotics sooner if she continues to do well.

## 2015-08-10 NOTE — Assessment & Plan Note (Addendum)
Will need continued rehab or PT due to weakness.

## 2015-08-10 NOTE — Progress Notes (Signed)
   Subjective:    Patient ID: [name], female    DOB: 02/10/1935, 81 y.o.   MRN: [id number]  HPI [name] is a 81 y.o. female with RA, PVD, hypothyroidsim, afib on coumadin who presented for reimplantation of her TKA on the left. She initially underwent left TKA in April [id number] and some complications with cellulitis treated with clindamycin and then developed left knee pain, concerned for infection and underwent I and D with polyexchange in August 2016. She completed 6 weeks of IV therapy with vancomycin and ceftriaxone and continue with doxycycline and cephalexin. She did have some diarrhea but C diff was negative and it resolved once cephalexin was stopped. In December, 2016, aspiration was done and underwent resection arthroplasty with antibiotic spacer placement. At that time, her tibial component could not safely be removed due to cracking so was left in place. I then placed her on vancomycin and ceftriaxone for 28 days and then transitioned to oral therapy. She then presented for reimplantation but she had elevated WBC in her synovial fluid with an elevated CRP and we opted to reinitiate therapy with 6 weeks of IV antibiotics which she completes on June 5th.  Her knee has been doing well with no erythema, no warmth and no effusion.  She continues in rehab and feels she continues to be very weak with no muscle mass in her legs.  No fever, no chills.  Her most recent WBC is up to 16, though she is also on steroids.        Review of Systems  Gastrointestinal: Negative for nausea and diarrhea.  Skin: Negative for rash.  Neurological: Negative for dizziness and light-headedness.       Objective:   Physical Exam  Constitutional: She appears well-developed and well-nourished. No distress.  Eyes: No scleral icterus.  Cardiovascular: Normal rate, regular rhythm and normal heart sounds.   Musculoskeletal:  Right arm picc without discharge, clean, no erythema  Skin: Skin is dry.    Flaking and peeling skin          Assessment & Plan:

## 2015-08-10 NOTE — Assessment & Plan Note (Deleted)
On prednisone and can get other immunosuppresive medications if indicated.

## 2015-08-18 ENCOUNTER — Encounter: Payer: Self-pay | Admitting: Internal Medicine

## 2015-09-03 ENCOUNTER — Ambulatory Visit (INDEPENDENT_AMBULATORY_CARE_PROVIDER_SITE_OTHER): Payer: Medicare Other | Admitting: Internal Medicine

## 2015-09-03 ENCOUNTER — Encounter: Payer: Self-pay | Admitting: Internal Medicine

## 2015-09-03 VITALS — BP 130/70 | HR 77 | Temp 98.4°F | Ht 67.0 in | Wt 133.0 lb

## 2015-09-03 DIAGNOSIS — T8454XA Infection and inflammatory reaction due to internal left knee prosthesis, initial encounter: Secondary | ICD-10-CM

## 2015-09-03 DIAGNOSIS — I8311 Varicose veins of right lower extremity with inflammation: Secondary | ICD-10-CM

## 2015-09-03 DIAGNOSIS — Z5181 Encounter for therapeutic drug level monitoring: Secondary | ICD-10-CM

## 2015-09-03 DIAGNOSIS — Z96652 Presence of left artificial knee joint: Secondary | ICD-10-CM | POA: Diagnosis not present

## 2015-09-03 DIAGNOSIS — E039 Hypothyroidism, unspecified: Secondary | ICD-10-CM

## 2015-09-03 DIAGNOSIS — Z7901 Long term (current) use of anticoagulants: Secondary | ICD-10-CM | POA: Diagnosis not present

## 2015-09-03 DIAGNOSIS — I8312 Varicose veins of left lower extremity with inflammation: Secondary | ICD-10-CM | POA: Diagnosis not present

## 2015-09-03 DIAGNOSIS — M05711 Rheumatoid arthritis with rheumatoid factor of right shoulder without organ or systems involvement: Secondary | ICD-10-CM | POA: Diagnosis not present

## 2015-09-03 DIAGNOSIS — I480 Paroxysmal atrial fibrillation: Secondary | ICD-10-CM

## 2015-09-03 DIAGNOSIS — K5190 Ulcerative colitis, unspecified, without complications: Secondary | ICD-10-CM | POA: Diagnosis not present

## 2015-09-03 DIAGNOSIS — I5043 Acute on chronic combined systolic (congestive) and diastolic (congestive) heart failure: Secondary | ICD-10-CM

## 2015-09-03 DIAGNOSIS — I872 Venous insufficiency (chronic) (peripheral): Secondary | ICD-10-CM

## 2015-09-03 MED ORDER — CEFUROXIME AXETIL 500 MG PO TABS: 500.0000 mg | ORAL_TABLET | Freq: Two times a day (BID) | ORAL | Status: DC

## 2015-09-03 MED ORDER — PREDNISONE 1 MG PO TABS: 4.0000 mg | ORAL_TABLET | Freq: Every day | ORAL | Status: DC

## 2015-09-03 MED ORDER — FERROUS SULFATE 325 (65 FE) MG PO TABS: 325.0000 mg | ORAL_TABLET | Freq: Every day | ORAL | Status: DC

## 2015-09-03 MED ORDER — POTASSIUM CHLORIDE ER 20 MEQ PO TBCR: 2.0000 | EXTENDED_RELEASE_TABLET | Freq: Every day | ORAL | Status: DC

## 2015-09-03 MED ORDER — OXYCODONE HCL 10 MG PO TABS
10.0000 mg | ORAL_TABLET | Freq: Two times a day (BID) | ORAL | Status: DC | PRN
Start: 2015-09-03 — End: 2015-10-06

## 2015-09-03 NOTE — Progress Notes (Signed)
Location:  [facility] clinic Provider:  Hooria Gasparini L. Mariea Clonts, D.O., C.M.D.  Code Status: full code Goals of Care:  Advanced Directives 09/03/2015  Does patient have an advance directive? No  Would patient like information on creating an advanced directive? -  has been educated on this, but has not yet completed  Chief Complaint  Patient presents with  . Hospitalization Follow-up    Rehab f/u after stay at [facility] s/p left TKA #2    HPI: Patient is a 81 y.o. female seen today for f/u from rehab at [facility] after undergoing her second left total knee replacement 07/04/25 by Dr. Alvan Dame.  She had an infected joint and had to previously have the prosthetic knee removed and antibiotic spacer placed temporarily.  She was maintained on abx through ID before that and continues on doxycycline and cefuroxime now.  She left [facility] on 08/27/15.  Left knee doesn't hurt, but back is killing her.  Had to sit in a chair from Dec until May.  Arms are stiff and has limited use of them.  Is getting [place] home health service.  Has had some therapy.  Needs to do more walking now.   Pain is largely gone and fluid is trying to come back.  Had been on lasix 40mg  and now on 20mg  (due to frequent urination).  Is on potassium, but otc type of some sort that she says is equivalent to the 51meq.  Asa and hctz were stopped.  Had only a few pills of spironolactone in the discharge list from [facility].  Takes Mon/Wed/Fri.    Does watch her salt intake.  Left leg is being more swollen again.    Fixed metoprolol succinate in place of lopressor (has been filling toprol).    Taking oxycodone 10mg --uses 1-2 per day.  Some days has not needed any.  Estradiol was refused at pharmacy and then she had missed her gyn appt.  Has been out.  Has continued progesterone.  Says gyn had not recommended stopping it before.  We discussed that at her age, the risks of those hormones outweigh the benefits and she's at high risk of DVT (though she is on  eliquis).    Remains on doxycycline and cefuroxime and has f/u with ID upcoming.  Sees Dr. Alvan Dame next week.   The left knee remains quite warm vs the right and the entire leg has more stasis skin changes than the right and is more edematous which has been longstanding.  Hearing is shot.  Vision is also worse in her perspective.  She blames the antibiotics.  She admits to more confusion since her last surgery, as well, and struggles with medication management, it seems to me.  Her husband is also geriatric and a bit frail.  He seems overall frustrated with her condition and endless med list.  Sadly, she appears to have an indication for all except the hormones.  We went through her med list from [facility] and updated the epic list so hopefully it is entirely correct on her AVS now.  Past Medical History  Diagnosis Date  . Ulcerative colitis ([place])   . Hypothyroidism   . Hypertension     pcp   dr Evelise Reine   [facility]  . Pneumonia 2008  . Chronic bronchitis ([place]) 10/31/2011    "prone to it; I have sinus/bronchitis problem probably q yr"  . Multiple skin tears 10/31/2011    "get them very easily; my skin is thin and I bruise easily"  .  Chronic back pain     "my entire spine"  . Edema   . Other and unspecified hyperlipidemia   . Encounter for long-term (current) use of other medications   . Senile osteoporosis   . Spinal stenosis, unspecified region other than cervical   . Unspecified glaucoma   . Hypothyroidism   . Anxiety     sometimes   . Swelling of right upper extremity 11/26/2014  . Diarrhea 01/27/2015  . Peripheral vascular disease ([place])   . Depression   . Anemia   . History of blood transfusion   . Hard of hearing   . Dizziness   . Falls   . H/O clavicle fracture     left  . Superficial thrombophlebitis 04/20/2015  . Complication of anesthesia 2009    htn with block- for hand surgery , pt thinks anesthesia causing some long term confusion  . Dysrhythmia     pt states bring  off coumadin for paroxysmal atrial fibrillation for last month  . Rheumatoid arthritis(714.0)     degenerative disc disease , ra  . Bruise     right lower extremity with opsite on    Past Surgical History  Procedure Laterality Date  . Back surgery    . Sinus surgery with instatrak    . Thyroidectomy, partial  1997  . Posterior fusion lumbar spine  10/31/2011    L2-3; 3-4  . Tonsillectomy      "when I was a small child"  . Functional endoscopic sinus surgery  1988  . Posterior fusion lumbar spine  11/2009    L3-4;  L4-5  . Spine surgery    . Posterior lumbar fusion N/A 08/05/13    T1, T2  . Anterior cervical decomp/discectomy fusion N/A 02/17/2014    Procedure: CERVICAL FOUR TO FIVE ANTERIOR CERVICAL DECOMPRESSION/DISCECTOMY FUSION 1 LEVEL;  Surgeon: [name], MD;  Location: [facility];  Service: Neurosurgery;  Laterality: N/A;  C4-5 Anterior cervical decompression/diskectomy/fusion  . Cataract extraction Left 11/11/2013  . Cataract extraction Right 12/30/2013  . Total knee arthroplasty Left 06/16/2014    Procedure: TOTAL KNEE ARTHROPLASTY;  Surgeon: Paralee Cancel, MD;  Location: [facility];  Service: Orthopedics;  Laterality: Left;  . I&d knee with poly exchange Left 10/13/2014    Procedure: INCISION AND DRAINAGE LEFT TOTAL KNEE WITH POLY EXCHANGE;  Surgeon: Paralee Cancel, MD;  Location: [facility];  Service: Orthopedics;  Laterality: Left;  . Excisional total knee arthroplasty with antibiotic spacers Left 03/01/2015    Procedure: RESECTION LEFT TOTAL KNEE ARTHROPLASTY WITH PLACEMENT OF  ANTIBIOTIC SPACERS;  Surgeon: Paralee Cancel, MD;  Location: [facility];  Service: Orthopedics;  Laterality: Left;  Marland Kitchen Eye surgery Bilateral   . Joint replacement  2009; 2006    joints 2 fingers,rt hand; joint left thumb  . Reimplantation of total knee Left 07/05/2015    Procedure: REIMPLANTATION OF LEFT TOTAL KNEE WITH REMOVAL OF ANTIBIOTIC SPACER;  Surgeon: Paralee Cancel, MD;  Location: [facility];  Service:  Orthopedics;  Laterality: Left;    No Known Allergies    Medication List       This list is accurate as of: 09/03/15  3:39 PM.  Always use your most recent med list.               apixaban 5 MG Tabs tablet  Commonly known as:  ELIQUIS  Take 1 tablet (5 mg total) by mouth 2 (two) times daily.     balsalazide 750 MG capsule  Commonly known as:  COLAZAL  Take 2,250 mg by mouth 2 (two) times daily.     bismuth subsalicylate [id number] suspension  Commonly known as:  PEPTO BISMOL  Take 15 mLs by mouth 4 (four) times daily -  before meals and at bedtime.     Calcium + D3 600-200 MG-UNIT Tabs  Take 1 tablet by mouth daily with breakfast.     cefUROXime 500 MG tablet  Commonly known as:  CEFTIN  Take 1 tablet (500 mg total) by mouth 2 (two) times daily with a meal.     CELEBREX 200 MG capsule  Generic drug:  celecoxib  Take 200 mg by mouth 2 (two) times daily.     diazepam 5 MG tablet  Commonly known as:  VALIUM  Take 5 mg by mouth every 12 (twelve) hours as needed for muscle spasms.     docusate sodium 100 MG capsule  Commonly known as:  COLACE  Take 1 capsule (100 mg total) by mouth 2 (two) times daily as needed for mild constipation or moderate constipation.     doxycycline 100 MG tablet  Commonly known as:  VIBRA-TABS  Take 1 tablet (100 mg total) by mouth 2 (two) times daily.     ferrous sulfate 325 (65 FE) MG tablet  Take 1 tablet (325 mg total) by mouth daily with breakfast.     folic acid 1 MG tablet  Commonly known as:  FOLVITE  Take 2 mg by mouth daily.     furosemide 20 MG tablet  Commonly known as:  LASIX  Take 20 mg by mouth daily as needed for edema.     guaiFENesin 600 MG 12 hr tablet  Commonly known as:  MUCINEX  Take 600 mg by mouth 2 (two) times daily.     latanoprost 0.005 % ophthalmic solution  Commonly known as:  XALATAN  Place 1 drop into both eyes at bedtime.     medroxyPROGESTERone 2.5 MG tablet  Commonly known as:  PROVERA  Take  2.5 mg by mouth at bedtime.     metoprolol succinate 25 MG 24 hr tablet  Commonly known as:  TOPROL-XL  Take 1 tablet by mouth 2 (two) times daily.     multivitamin with minerals Tabs tablet  Take 1 tablet by mouth daily with lunch.     Oxycodone HCl 10 MG Tabs  Take 1 tablet (10 mg total) by mouth 2 (two) times daily as needed (severe pain).     polyethylene glycol packet  Commonly known as:  MIRALAX / GLYCOLAX  Take 17 g by mouth daily as needed for mild constipation.     Potassium Chloride ER 20 MEQ Tbcr  Take 2 tablets by mouth daily.     predniSONE 1 MG tablet  Commonly known as:  DELTASONE  Take 4 tablets (4 mg total) by mouth daily.     SENNA S PO  Take 1 tablet by mouth daily as needed (constipation.).     simethicone 125 MG chewable tablet  Commonly known as:  MYLICON  Chew 0000000 mg by mouth every 8 (eight) hours as needed.     spironolactone 25 MG tablet  Commonly known as:  ALDACTONE  Take 25 mg by mouth 3 (three) times a week. On Mon/Wed/Fri     SYNTHROID 88 MCG tablet  Generic drug:  levothyroxine  Take 88 mcg by mouth daily before breakfast. DAW, DO NOT SUBSTITUTE WITH GENERIC     Vitamin D3 1000  units Caps  Take 1,000 Units by mouth daily.        Review of Systems:  Review of Systems  Constitutional: Positive for weight loss. Negative for fever and chills.  HENT: Positive for hearing loss. Negative for congestion.   Eyes: Positive for blurred vision.  Respiratory: Negative for cough, sputum production and shortness of breath.   Cardiovascular: Negative for chest pain, palpitations and leg swelling.  Gastrointestinal: Positive for diarrhea. Negative for abdominal pain and constipation.       Ongoing chronic colitis  Genitourinary: Positive for frequency. Negative for dysuria and urgency.       With diuretics  Musculoskeletal: Positive for back pain. Negative for joint pain and falls.  Skin: Negative for rash.  Neurological: Positive for weakness.  Negative for dizziness and loss of consciousness.  Endo/Heme/Allergies: Bruises/bleeds easily.  Psychiatric/Behavioral: Positive for memory loss.       Some increased confusion about her meds and history at times which has developed over the past year    Health Maintenance  Topic Date Due  . ZOSTAVAX  01/26/1997  . PNA vac Low Risk Adult (2 of 2 - PPSV23) 03/27/2015  . INFLUENZA VACCINE  10/12/2015  . TETANUS/TDAP  08/15/2024  . DEXA SCAN  Completed    Physical Exam: Filed Vitals:   09/03/15 0950  BP: 130/70  Pulse: 77  Temp: 98.4 F (36.9 C)  TempSrc: Oral  Height: 5\' 7"  (1.702 m)  Weight: 133 lb (60.328 kg)  SpO2: 98%   Body mass index is 20.83 kg/(m^2). Physical Exam  Constitutional: She is oriented to person, place, and time. No distress.  HENT:  Head: Normocephalic and atraumatic.  HOH  Eyes:  glasses  Cardiovascular: Normal rate, regular rhythm, normal heart sounds and intact distal pulses.   Pulmonary/Chest: Effort normal and breath sounds normal. No respiratory distress.  Abdominal: Soft. Bowel sounds are normal. She exhibits no distension. There is no tenderness.  Musculoskeletal:  Walking with walker, but has legs turned out especially left; now has long left leg since TKA  Neurological: She is alert and oriented to person, place, and time.  Some short term memory loss noted during history taking  Skin: Skin is warm and dry.  Venous stasis changes of left greater than right leg and extending up to knee on left; has warmth of left knee joint; 3+ edema left leg even while wearing compression sock, also wearing slippers instead of proper supportive footwear  Psychiatric: She has a normal mood and affect.    Labs reviewed: Basic Metabolic Panel:  Recent Labs  10/29/14 1903  06/28/15 1430 07/06/15 0508 07/07/15 0457  NA 140  < > 143 141 143  K 3.7  < > 5.5* 5.1 4.9  CL 105  < > 110 109 112*  CO2 30  < > 25 23 23   GLUCOSE 93  < > 106* 135* 105*  BUN 19   < > 46* 27* 38*  CREATININE 1.14*  < > 1.46* 1.05* 1.24*  CALCIUM 8.5*  < > 9.5 8.6* 8.3*  MG 2.3  --   --   --   --   PHOS 3.7  --   --   --   --   TSH 0.851  --   --   --   --   < > = values in this interval not displayed. Liver Function Tests:  Recent Labs  12/14/14 1041 12/25/14 1346 07/07/15 0457  AST 37 41* 22  ALT  13 15 7*  ALKPHOS 118* 114 153*  BILITOT 0.4 0.4 0.4  PROT 6.0 5.9* 5.0*  ALBUMIN 4.0 4.0 2.5*   No results for input(s): LIPASE, AMYLASE in the last 8760 hours. No results for input(s): AMMONIA in the last 8760 hours. CBC:  Recent Labs  01/11/15 1543  03/26/15 0405  04/20/15 1451 06/28/15 1430 07/06/15 0508 07/07/15 0457  WBC 8.9  < > 5.4  --  6.1 6.9 10.5 11.6*  NEUTROABS 7.3*  --  3.0  --  4.7  --   --   --   HGB  --   < > 8.7*  < > 10.3* 12.0 10.4* 9.6*  HCT 37.4  < > 28.4*  < > 33.6* 37.9 32.5* 29.3*  MCV 88  < > 95.0  --  93.6 87.5 84.0 85.2  PLT 221  < > 247  --  375 269 204 203  < > = values in this interval not displayed. Lipid Panel: No results for input(s): CHOL, HDL, LDLCALC, TRIG, CHOLHDL, LDLDIRECT in the last 8760 hours. No results found for: HGBA1C  Procedures since last visit: Left TKA 07/05/15  Assessment/Plan 1. Infection of prosthetic left knee joint ([place]) - now with new tka - cont pain regimen, doxy and ceftin for this and keep f/u with Dr. Linus Salmons as planned  - notably warm left knee, but no pain - Oxycodone HCl 10 MG TABS; Take 1 tablet (10 mg total) by mouth 2 (two) times daily as needed (severe pain).  Dispense: 60 tablet; Refill: 0 - cefUROXime (CEFTIN) 500 MG tablet; Take 1 tablet (500 mg total) by mouth 2 (two) times daily with a meal.  Dispense: 60 tablet; Refill: 4 - CBC with Differential/Platelet  2. Status post total left knee replacement - not painful anymore and able to walk on it, but leg longer now and joint still very warm vs. Right - cont home health PT and recommended she discuss her long leg with them so  a lift could be considered in her right shoe to help her gait with her walker  3. Rheumatoid arthritis  with positive rheumatoid factor (HCC) - cont pain regimen--given refill of oxycodone as she takes it, just 2 per day max - Oxycodone HCl 10 MG TABS; Take 1 tablet (10 mg total) by mouth 2 (two) times daily as needed (severe pain).  Dispense: 60 tablet; Refill: 0 - predniSONE (DELTASONE) 1 MG tablet; Take 4 tablets (4 mg total) by mouth daily.  Dispense: 120 tablet; Refill: 3  4. Paroxysmal atrial fibrillation (HCC) -cont eliquis, toprol for rate control  5. Anticoagulation goal of INR 2 to 3 - no longer on coumadin--now on eliquis so monitor cbc and bmp - ferrous sulfate 325 (65 FE) MG tablet; Take 1 tablet (325 mg total) by mouth daily with breakfast.; Refill: 3  6. Chronic venous insufficiency - cont compression sock, elevation, diuretics, but keeps reducing on her own due to urinary frequency - Basic metabolic panel  7. Chronic venous stasis dermatitis of both lower extremities - worsening in left leg since surgeries - see below  8. Ulcerative colitis, chronic, without complications (HCC) - cont steroid and f/u labs - CBC with Differential/Platelet  9. Hypothyroidism, unspecified hypothyroidism type - cont current synthroid and f/u labs - TSH  10. Acute on chronic combined systolic and diastolic congestive heart failure ([place]) -continues with increased edema especially left leg which seems more venous  - cont compression sock -elevate at rest -avoid high sodium  foods -cont toprol - wear shoes not slippers for more support - cont diuretics as directed until I get potassium results and determine course of action   Labs/tests ordered:   Orders Placed This Encounter  Procedures  . CBC with Differential/Platelet  . Basic metabolic panel  . TSH    Next appt:  12/06/2015 [facility] [place]. Shaunika Italiano, D.O. [facility] [address],  [address] Cell Phone (Mon-Fri 8am-5pm):  [phone] On Call:  [phone] & follow prompts after 5pm & weekends Office Phone:  [phone] Office Fax:  [phone]

## 2015-09-04 LAB — TSH: TSH: 0.668 u[IU]/mL (ref 0.450–4.500)

## 2015-09-04 LAB — CBC WITH DIFFERENTIAL/PLATELET
Basophils Absolute: 0 10*3/uL (ref 0.0–0.2)
Basos: 0 %
EOS (ABSOLUTE): 0 10*3/uL (ref 0.0–0.4)
Eos: 0 %
Hematocrit: 34.4 % (ref 34.0–46.6)
Hemoglobin: 10.6 g/dL — ABNORMAL LOW (ref 11.1–15.9)
Immature Grans (Abs): 0.1 10*3/uL (ref 0.0–0.1)
Immature Granulocytes: 1 %
Lymphocytes Absolute: 0.9 10*3/uL (ref 0.7–3.1)
Lymphs: 9 %
MCH: 27.6 pg (ref 26.6–33.0)
MCHC: 30.8 g/dL — ABNORMAL LOW (ref 31.5–35.7)
MCV: 90 fL (ref 79–97)
Monocytes Absolute: 0.7 10*3/uL (ref 0.1–0.9)
Monocytes: 7 %
Neutrophils Absolute: 8 10*3/uL — ABNORMAL HIGH (ref 1.4–7.0)
Neutrophils: 83 %
Platelets: 380 10*3/uL — ABNORMAL HIGH (ref 150–379)
RBC: 3.84 x10E6/uL (ref 3.77–5.28)
RDW: 17.7 % — ABNORMAL HIGH (ref 12.3–15.4)
WBC: 9.7 10*3/uL (ref 3.4–10.8)

## 2015-09-04 LAB — BASIC METABOLIC PANEL
BUN/Creatinine Ratio: 34 — ABNORMAL HIGH (ref 12–28)
BUN: 33 mg/dL — ABNORMAL HIGH (ref 8–27)
CO2: 21 mmol/L (ref 18–29)
Calcium: 9.6 mg/dL (ref 8.7–10.3)
Chloride: 97 mmol/L (ref 96–106)
Creatinine, Ser: 0.97 mg/dL (ref 0.57–1.00)
GFR calc Af Amer: 65 mL/min/{1.73_m2} (ref >59)
GFR calc non Af Amer: 56 mL/min/{1.73_m2} — ABNORMAL LOW (ref >59)
Glucose: 82 mg/dL (ref 65–99)
Potassium: 4.1 mmol/L (ref 3.5–5.2)
Sodium: 137 mmol/L (ref 134–144)

## 2015-09-06 ENCOUNTER — Encounter: Payer: Self-pay | Admitting: *Deleted

## 2015-09-22 ENCOUNTER — Ambulatory Visit (INDEPENDENT_AMBULATORY_CARE_PROVIDER_SITE_OTHER): Payer: Medicare Other | Admitting: Internal Medicine

## 2015-09-22 ENCOUNTER — Encounter: Payer: Self-pay | Admitting: Internal Medicine

## 2015-09-22 VITALS — BP 108/68 | HR 73 | Temp 98.8°F | Ht 67.0 in | Wt 136.0 lb

## 2015-09-22 DIAGNOSIS — M05762 Rheumatoid arthritis with rheumatoid factor of left knee without organ or systems involvement: Secondary | ICD-10-CM | POA: Diagnosis not present

## 2015-09-22 DIAGNOSIS — T8454XA Infection and inflammatory reaction due to internal left knee prosthesis, initial encounter: Secondary | ICD-10-CM

## 2015-09-22 NOTE — Assessment & Plan Note (Signed)
Certainly is complicating therapy.

## 2015-09-22 NOTE — Assessment & Plan Note (Signed)
She is doing well and I will check crp, esr today and be sure not too elevated, though certainly will be clouded by her concurrent RA.  She will continue with the doxy and cefuroxime for 2 months and will consider transition to suppression at that time.  I do agree with Dr. Alvan Dame that chronic suppression at this point would be optimal since she is tolerating well.

## 2015-09-22 NOTE — Progress Notes (Signed)
   Subjective:    Patient ID: [name], female    DOB: 11-Jun-1934, 81 y.o.   MRN: [id number]  HPI  Here for follow up of PJI.  [name] is a 81 y.o. female with RA, PVD, hypothyroidsim, afib on coumadin who presented for reimplantation of her TKA on the left. She initially underwent left TKA in April [id number] and some complications with cellulitis treated with clindamycin and then developed left knee pain, concerned for infection and underwent I and D with polyexchange in August 2016. She completed 6 weeks of IV therapy with vancomycin and ceftriaxone and continue with doxycycline and cephalexin. She did have some diarrhea but C diff was negative and it resolved once cephalexin was stopped. In December, 2016, aspiration was done and underwent resection arthroplasty with antibiotic spacer placement. At that time, her tibial component could not safely be removed due to cracking so was left in place. I then placed her on vancomycin and ceftriaxone for 28 days and then transitioned to oral therapy. She then presented for reimplantation but she had elevated WBC in her synovial fluid with an elevated CRP and we opted to reinitiate therapy with 6 weeks of IV antibiotics which she completed on June 5th.  She then transitioned to oral doxycycline and cefuroxime for 3-6 months and taking that.  No new issues though frustrated with slow progress.  Saw Dr. Alvan Dame yesterday who feels her knee is doing well.  He suggested she may need chronic suppression     Review of Systems  Gastrointestinal: Negative for nausea and diarrhea.  Skin: Negative for rash.  Neurological: Negative for dizziness and light-headedness.       Objective:   Physical Exam  Constitutional: She appears well-developed and well-nourished. No distress.  Eyes: No scleral icterus.  Cardiovascular: Normal rate, regular rhythm and normal heart sounds.   Skin: Skin is dry.          Assessment & Plan:

## 2015-09-23 ENCOUNTER — Other Ambulatory Visit: Payer: Self-pay | Admitting: Internal Medicine

## 2015-09-23 LAB — BASIC METABOLIC PANEL
BUN: 30 mg/dL — ABNORMAL HIGH (ref 7–25)
CHLORIDE: 104 mmol/L (ref 98–110)
CO2: 21 mmol/L (ref 20–31)
Calcium: 9 mg/dL (ref 8.6–10.4)
Creat: 1.07 mg/dL — ABNORMAL HIGH (ref 0.60–0.93)
GLUCOSE: 90 mg/dL (ref 65–99)
Potassium: 5.1 mmol/L (ref 3.5–5.3)
SODIUM: 137 mmol/L (ref 135–146)

## 2015-09-23 LAB — C-REACTIVE PROTEIN: CRP: 6.1 mg/dL — ABNORMAL HIGH (ref <0.60)

## 2015-09-23 LAB — SEDIMENTATION RATE: SED RATE: 20 mm/h (ref 0–30)

## 2015-09-24 ENCOUNTER — Telehealth: Payer: Self-pay | Admitting: *Deleted

## 2015-09-24 NOTE — Telephone Encounter (Signed)
Prior authorization initiated for Eliquis. Spoke with Anatasia at [phone] and medication was APPROVED through 03/12/2016. Case #: [id number] Patient ID#: [id number]

## 2015-09-29 ENCOUNTER — Other Ambulatory Visit: Payer: Self-pay | Admitting: *Deleted

## 2015-09-29 MED ORDER — APIXABAN 5 MG PO TABS: 5.0000 mg | ORAL_TABLET | Freq: Two times a day (BID) | ORAL | Status: DC

## 2015-09-29 NOTE — Telephone Encounter (Signed)
Patient called and stated that the pharmacy did not received last refill request for Eliquis. Faxed Rx to [facility].

## 2015-10-06 ENCOUNTER — Other Ambulatory Visit: Payer: Self-pay | Admitting: *Deleted

## 2015-10-06 DIAGNOSIS — M05711 Rheumatoid arthritis with rheumatoid factor of right shoulder without organ or systems involvement: Secondary | ICD-10-CM

## 2015-10-06 DIAGNOSIS — T8454XA Infection and inflammatory reaction due to internal left knee prosthesis, initial encounter: Secondary | ICD-10-CM

## 2015-10-06 MED ORDER — OXYCODONE HCL 10 MG PO TABS: 10.0000 mg | ORAL_TABLET | Freq: Two times a day (BID) | ORAL | 0 refills | Status: DC | PRN

## 2015-10-06 NOTE — Telephone Encounter (Signed)
Patient requested pain medication and will pick up

## 2015-10-09 ENCOUNTER — Emergency Department (HOSPITAL_COMMUNITY): Payer: Medicare Other

## 2015-10-09 ENCOUNTER — Encounter (HOSPITAL_COMMUNITY): Payer: Self-pay | Admitting: *Deleted

## 2015-10-09 ENCOUNTER — Emergency Department (HOSPITAL_COMMUNITY)
Admission: EM | Admit: 2015-10-09 | Discharge: 2015-10-09 | Disposition: A | Discharge status: Home / Self Care | Payer: Medicare Other | Attending: Emergency Medicine | Admitting: Emergency Medicine

## 2015-10-09 DIAGNOSIS — F17210 Nicotine dependence, cigarettes, uncomplicated: Secondary | ICD-10-CM | POA: Diagnosis not present

## 2015-10-09 DIAGNOSIS — S0990XA Unspecified injury of head, initial encounter: Secondary | ICD-10-CM | POA: Insufficient documentation

## 2015-10-09 DIAGNOSIS — W01190A Fall on same level from slipping, tripping and stumbling with subsequent striking against furniture, initial encounter: Secondary | ICD-10-CM | POA: Diagnosis not present

## 2015-10-09 DIAGNOSIS — S0083XA Contusion of other part of head, initial encounter: Secondary | ICD-10-CM | POA: Insufficient documentation

## 2015-10-09 DIAGNOSIS — Y999 Unspecified external cause status: Secondary | ICD-10-CM | POA: Diagnosis not present

## 2015-10-09 DIAGNOSIS — J449 Chronic obstructive pulmonary disease, unspecified: Secondary | ICD-10-CM | POA: Insufficient documentation

## 2015-10-09 DIAGNOSIS — Z7901 Long term (current) use of anticoagulants: Secondary | ICD-10-CM | POA: Insufficient documentation

## 2015-10-09 DIAGNOSIS — I5033 Acute on chronic diastolic (congestive) heart failure: Secondary | ICD-10-CM | POA: Insufficient documentation

## 2015-10-09 DIAGNOSIS — S8011XA Contusion of right lower leg, initial encounter: Secondary | ICD-10-CM | POA: Insufficient documentation

## 2015-10-09 DIAGNOSIS — D329 Benign neoplasm of meninges, unspecified: Secondary | ICD-10-CM

## 2015-10-09 DIAGNOSIS — Y939 Activity, unspecified: Secondary | ICD-10-CM | POA: Diagnosis not present

## 2015-10-09 DIAGNOSIS — S0011XA Contusion of right eyelid and periocular area, initial encounter: Secondary | ICD-10-CM | POA: Diagnosis not present

## 2015-10-09 DIAGNOSIS — Y929 Unspecified place or not applicable: Secondary | ICD-10-CM | POA: Insufficient documentation

## 2015-10-09 DIAGNOSIS — Z96652 Presence of left artificial knee joint: Secondary | ICD-10-CM | POA: Insufficient documentation

## 2015-10-09 DIAGNOSIS — I110 Hypertensive heart disease with heart failure: Secondary | ICD-10-CM | POA: Diagnosis not present

## 2015-10-09 DIAGNOSIS — S51812A Laceration without foreign body of left forearm, initial encounter: Secondary | ICD-10-CM | POA: Insufficient documentation

## 2015-10-09 DIAGNOSIS — Z96692 Finger-joint replacement of left hand: Secondary | ICD-10-CM | POA: Insufficient documentation

## 2015-10-09 DIAGNOSIS — W19XXXA Unspecified fall, initial encounter: Secondary | ICD-10-CM

## 2015-10-09 DIAGNOSIS — S59912A Unspecified injury of left forearm, initial encounter: Secondary | ICD-10-CM | POA: Diagnosis present

## 2015-10-09 DIAGNOSIS — S41112A Laceration without foreign body of left upper arm, initial encounter: Secondary | ICD-10-CM

## 2015-10-09 NOTE — ED Notes (Signed)
PA at bedside.

## 2015-10-09 NOTE — ED Notes (Signed)
Pt is in stable condition upon d/c and is escorted from ED via wheelchair. 

## 2015-10-09 NOTE — ED Notes (Signed)
Called pt house and spoke with husband to come and get pt.

## 2015-10-09 NOTE — Discharge Instructions (Signed)
You were seen in the emergency room today for evaluation after a fall. Your CT scans showed some bruising and swelling but no broken bones or intracranial bleeding. You may take tylenol as needed for pain. As we discussed, your CT scan of your head also showed evidence of two meningiomas. Please follow up with your primary care provider early next week. She may want to have you see a neurosurgeon for a consultation. Return to the emergency room for new or worsening symptoms.

## 2015-10-09 NOTE — ED Provider Notes (Signed)
[facility] DEPT Provider Note   CSN: [id number] Arrival date & time: 10/09/15  1631  First Provider Contact:  First MD Initiated Contact with Patient 10/09/15 [place]        History   Chief Complaint Chief Complaint  Patient presents with  . Fall    HPI  [name] is an 81 y.o. female with history of paroxysmal afib (now on Elliquis), CHF, HTN, chronic edema who presents to the ED for evaluation after a mechanical fall. She reports she stood up from sitting and tripped on her socks. She states she fell forwards and caught herself with her hands but still hit her face on her coffee table. Denies LOC. She states she has some pain in her left elbow and feels a little sore all over. Denies headache or blurred vision. Denies neck pain. Denies nausea or vomiting. Denies chest pain or SOB. She declines pain meds at this time.  Past Medical History:  Diagnosis Date  . Anemia   . Anxiety    sometimes   . Bruise    right lower extremity with opsite on  . Chronic back pain    "my entire spine"  . Chronic bronchitis ([place]) 10/31/2011   "prone to it; I have sinus/bronchitis problem probably q yr"  . Complication of anesthesia 2009   htn with block- for hand surgery , pt thinks anesthesia causing some long term confusion  . Depression   . Diarrhea 01/27/2015  . Dizziness   . Dysrhythmia    pt states bring off coumadin for paroxysmal atrial fibrillation for last month  . Edema   . Encounter for long-term (current) use of other medications   . Falls   . H/O clavicle fracture    left  . Hard of hearing   . History of blood transfusion   . Hypertension    pcp   dr reed   [facility]  . Hypothyroidism   . Hypothyroidism   . Multiple skin tears 10/31/2011   "get them very easily; my skin is thin and I bruise easily"  . Other and unspecified hyperlipidemia   . Peripheral vascular disease ([place])   . Pneumonia 2008  . Rheumatoid arthritis(714.0)    degenerative disc disease ,  ra  . Senile osteoporosis   . Spinal stenosis, unspecified region other than cervical   . Superficial thrombophlebitis 04/20/2015  . Swelling of right upper extremity 11/26/2014  . Ulcerative colitis ([place])   . Unspecified glaucoma     Patient Active Problem List   Diagnosis Date Noted  . Weakness 08/10/2015  . Malnutrition of moderate degree 07/07/2015  . S/P left TK reimplantation 07/05/2015  . S/P revision of total knee 07/05/2015  . Persistent atrial fibrillation ([place])   . Superficial thrombophlebitis 04/20/2015  . Left TK resection / spacer 03/01/2015  . Diarrhea 01/27/2015  . Atrial fibrillation ([place]) [I48.91] 12/14/2014  . Long term (current) use of anticoagulants [Z79.01] 12/14/2014  . Adhesive capsulitis of right shoulder 12/10/2014  . Swelling of right upper extremity 11/26/2014  . Acute on chronic diastolic CHF (congestive heart failure), NYHA class 1 ([place]) 10/29/2014  . Cellulitis 10/29/2014  . Anemia, iron deficiency 10/29/2014  . Infection of prosthetic left knee joint ([place]) 10/13/2014  . Benign essential HTN   . S/P left TKA 06/16/2014  . Cervical spondylosis with myelopathy 02/17/2014  . Chronic venous stasis dermatitis of both lower extremities 09/08/2013  . Lumbar stenosis with neurogenic claudication 08/05/2013  . Hypothyroidism   .  Rheumatoid arthritis ([place]) 11/22/2011  . Ulcerative colitis, chronic ([place]) 11/22/2011    Past Surgical History:  Procedure Laterality Date  . ANTERIOR CERVICAL DECOMP/DISCECTOMY FUSION N/A 02/17/2014   Procedure: CERVICAL FOUR TO FIVE ANTERIOR CERVICAL DECOMPRESSION/DISCECTOMY FUSION 1 LEVEL;  Surgeon: [name], MD;  Location: [facility];  Service: Neurosurgery;  Laterality: N/A;  C4-5 Anterior cervical decompression/diskectomy/fusion  . BACK SURGERY    . CATARACT EXTRACTION Left 11/11/2013  . CATARACT EXTRACTION Right 12/30/2013  . EXCISIONAL TOTAL KNEE ARTHROPLASTY WITH ANTIBIOTIC SPACERS Left 03/01/2015   Procedure:  RESECTION LEFT TOTAL KNEE ARTHROPLASTY WITH PLACEMENT OF  ANTIBIOTIC SPACERS;  Surgeon: Paralee Cancel, MD;  Location: [facility];  Service: Orthopedics;  Laterality: Left;  . EYE SURGERY Bilateral   . FUNCTIONAL ENDOSCOPIC SINUS SURGERY  1988  . I&D KNEE WITH POLY EXCHANGE Left 10/13/2014   Procedure: INCISION AND DRAINAGE LEFT TOTAL KNEE WITH POLY EXCHANGE;  Surgeon: Paralee Cancel, MD;  Location: [facility];  Service: Orthopedics;  Laterality: Left;  . JOINT REPLACEMENT  2009; 2006   joints 2 fingers,rt hand; joint left thumb  . POSTERIOR FUSION LUMBAR SPINE  10/31/2011   L2-3; 3-4  . POSTERIOR FUSION LUMBAR SPINE  11/2009   L3-4;  L4-5  . POSTERIOR LUMBAR FUSION N/A 08/05/13   T1, T2  . REIMPLANTATION OF TOTAL KNEE Left 07/05/2015   Procedure: REIMPLANTATION OF LEFT TOTAL KNEE WITH REMOVAL OF ANTIBIOTIC SPACER;  Surgeon: Paralee Cancel, MD;  Location: [facility];  Service: Orthopedics;  Laterality: Left;  . SINUS SURGERY WITH INSTATRAK    . SPINE SURGERY    . THYROIDECTOMY, PARTIAL  1997  . TONSILLECTOMY     "when I was a small child"  . TOTAL KNEE ARTHROPLASTY Left 06/16/2014   Procedure: TOTAL KNEE ARTHROPLASTY;  Surgeon: Paralee Cancel, MD;  Location: [facility];  Service: Orthopedics;  Laterality: Left;    OB History    No data available       Home Medications    Prior to Admission medications   Medication Sig Start Date End Date Taking? Authorizing Provider  apixaban (ELIQUIS) 5 MG TABS tablet Take 1 tablet (5 mg total) by mouth 2 (two) times daily. 09/29/15   [name] L [name], DO  balsalazide (COLAZAL) 750 MG capsule TAKE 3 CAPSULES TWICE DAILY 09/23/15   [name] L [name], DO  bismuth subsalicylate (PEPTO BISMOL) 262 MG/15ML suspension Take 15 mLs by mouth 4 (four) times daily -  before meals and at bedtime.    Historical Provider, MD  Calcium Carb-Cholecalciferol (CALCIUM + D3) 600-200 MG-UNIT TABS Take 1 tablet by mouth daily with breakfast.     Historical Provider, MD  cefUROXime (CEFTIN) 500 MG tablet  TAKE 1 TABLET TWICE A DAY 09/23/15   [name] L [name], DO  celecoxib (CELEBREX) 200 MG capsule TAKE 1 CAPSULE TWICE A DAY 09/23/15   [name] L [name], DO  Cholecalciferol (VITAMIN D3) 1000 UNITS CAPS Take 1,000 Units by mouth daily.    Historical Provider, MD  diazepam (VALIUM) 5 MG tablet Take 5 mg by mouth every 12 (twelve) hours as needed for muscle spasms.    Historical Provider, MD  docusate sodium (COLACE) 100 MG capsule Take 1 capsule (100 mg total) by mouth 2 (two) times daily as needed for mild constipation or moderate constipation. 07/08/15   Danae Orleans, PA-C  doxycycline (VIBRA-TABS) 100 MG tablet TAKE 1 TABLET TWICE A DAY 09/23/15   [name] L [name], DO  ferrous sulfate 325 (65 FE) MG tablet  Take 1 tablet (325 mg total) by mouth daily with breakfast. 09/03/15   [name] L [name], DO  folic acid (FOLVITE) 1 MG tablet Take 2 mg by mouth daily.    Historical Provider, MD  furosemide (LASIX) 20 MG tablet TAKE 1 TABLET EVERY DAY AS NEEDED SWELLING 09/23/15   [name] L [name], DO  guaiFENesin (MUCINEX) 600 MG 12 hr tablet Take 600 mg by mouth 2 (two) times daily.    Historical Provider, MD  latanoprost (XALATAN) 0.005 % ophthalmic solution Place 1 drop into both eyes at bedtime.    Historical Provider, MD  levothyroxine (SYNTHROID, LEVOTHROID) 88 MCG tablet TAKE 1 TABLET EVERY DAY BEFORE BREAKFAST 09/23/15   [name] L [name], DO  medroxyPROGESTERone (PROVERA) 2.5 MG tablet Take 2.5 mg by mouth at bedtime.  06/02/13   Historical Provider, MD  metoprolol succinate (TOPROL-XL) 25 MG 24 hr tablet Take 1 tablet by mouth 2 (two) times daily. 06/30/15   Historical Provider, MD  metoprolol tartrate (LOPRESSOR) 25 MG tablet TAKE 1 TABLET TWICE A DAY WITH MEALS 09/23/15   [name] L [name], DO  Multiple Vitamin (MULTIVITAMIN WITH MINERALS) TABS Take 1 tablet by mouth daily with lunch.     Historical Provider, MD  Oxycodone HCl 10 MG TABS Take 1 tablet (10 mg total) by mouth 2 (two) times daily as needed (severe pain). 10/06/15    Lauree Chandler, NP  polyethylene glycol (MIRALAX / GLYCOLAX) packet Take 17 g by mouth daily as needed for mild constipation.     Historical Provider, MD  Potassium Chloride ER 20 MEQ TBCR Take 2 tablets by mouth daily. 09/03/15   [name] L [name], DO  predniSONE (DELTASONE) 1 MG tablet Take 4 tablets (4 mg total) by mouth daily. 09/03/15   [name] L [name], DO  Sennosides-Docusate Sodium (SENNA S PO) Take 1 tablet by mouth daily as needed (constipation.).    Historical Provider, MD  simethicone (MYLICON) 0000000 MG chewable tablet Chew 125 mg by mouth every 8 (eight) hours as needed.    Historical Provider, MD  spironolactone (ALDACTONE) 25 MG tablet TAKE 1 TABLET M, W, FRIDAY ONLY 09/23/15   Gayland Curry, DO    Family History Family History  Problem Relation Age of Onset  . Heart disease Mother   . Cancer Father     lung    Social History Social History  Substance Use Topics  . Smoking status: Current Every Day Smoker    Packs/day: 0.25    Years: 50.00    Types: Cigarettes    Last attempt to quit: 02/20/2015  . Smokeless tobacco: Never Used     Comment: one a day, i dont inhale per pt  . Alcohol use No     Allergies   Review of patient's allergies indicates no known allergies.   Review of Systems Review of Systems  All other systems reviewed and are negative.    Physical Exam Updated Vital Signs BP 148/66 (BP Location: Right Arm)   Pulse (!) 59   Temp 99.3 F (37.4 C) (Oral)   Resp 18   Ht 5\' 7"  (1.702 m)   Wt 59.4 kg   SpO2 100%   BMI 20.52 kg/m   Physical Exam  Constitutional: She is oriented to person, place, and time.  HENT:  Right Ear: External ear normal.  Left Ear: External ear normal.  Mouth/Throat: Oropharynx is clear and moist.  Ecchymosis to lateral right eyebrow and periorbital area Ecchymosis to right side of chin  No facial tenderness or crepitus No nasal swelling FROM of TMJ No hemotympanum  Eyes: Conjunctivae and EOM are normal. Pupils  are equal, round, and reactive to light.  Neck: Normal range of motion. Neck supple.  No c-spine tenderness  Cardiovascular: Normal rate, regular rhythm, normal heart sounds and intact distal pulses.   Pulmonary/Chest: Effort normal and breath sounds normal. No respiratory distress. She has no wheezes. She exhibits no tenderness.  Abdominal: Soft. Bowel sounds are normal. She exhibits no distension. There is no tenderness. There is no rebound and no guarding.  Musculoskeletal: She exhibits no edema.  No midline back tenderness R UE with some chronic bruising, no new. No focal tenderness or deformity. FROM. Forearm with diffuse chronic bruising. L UE with 5cm superficial skin tear at proximal dorsal forearm. Skin is very thin. Bleeding well controlled. Elbow with some posterior tenderness but FROM.  L hand with some diffuse tenderness but FROM of all digits.  2+ radial pulses bilateral UE, brisk cap refill No pelvic instability or hip tenderness R shin with area of new bruising that is tender to palpation No other LE tenderness or deformity. Bilateral LE with 2-3+ pitting edema that is chronic. Bilateral LE with some old bruising.  Neurological: She is alert and oriented to person, place, and time. No cranial nerve deficit.  Skin: Skin is warm and dry.  Psychiatric: She has a normal mood and affect.  Nursing note and vitals reviewed.    ED Treatments / Results  Labs (all labs ordered are listed, but only abnormal results are displayed) Labs Reviewed - No data to display  EKG  EKG Interpretation None       Radiology Dg Elbow Complete Left  Result Date: 10/09/2015 CLINICAL DATA:  Recent fall with elbow pain, initial encounter EXAM: LEFT ELBOW - COMPLETE 3+ VIEW COMPARISON:  None. FINDINGS: Significant degenerative changes are noted of the elbow joint consistent with the patient's given clinical history of rheumatoid arthritis. Remodeling of the proximal ulna and distal humerus is  seen. Some soft tissue swelling is noted. Remodeling of the radial head is noted as well. No definitive acute fracture or dislocation is seen. IMPRESSION: Significant degenerative change and remodeling in the elbow joint consistent with the given clinical history of rheumatoid arthritis. No definitive acute fracture is seen. Electronically Signed   By: Inez Catalina M.D.   On: 10/09/2015 17:46  Dg Tibia/fibula Right  Result Date: 10/09/2015 CLINICAL DATA:  Recent fall with right lower leg pain, initial encounter EXAM: RIGHT TIBIA AND FIBULA - 2 VIEW COMPARISON:  None. FINDINGS: Mild soft tissue swelling is noted. No acute fracture or dislocation is seen. Diffuse vascular calcifications are noted. IMPRESSION: Mild soft tissue swelling which may be related to the recent injury. No acute bony abnormality is seen. Electronically Signed   By: Inez Catalina M.D.   On: 10/09/2015 17:48  Ct Head Wo Contrast  Result Date: 10/09/2015 CLINICAL DATA:  Tripped and fell today. Hit head on coffee table. Soft tissue swelling to the right orbit and chin. No loss of consciousness. The patient does take a blood thinner. EXAM: CT HEAD WITHOUT CONTRAST CT MAXILLOFACIAL WITHOUT CONTRAST TECHNIQUE: Multidetector CT imaging of the head and maxillofacial structures were performed using the standard protocol without intravenous contrast. Multiplanar CT image reconstructions of the maxillofacial structures were also generated. COMPARISON:  CT of the cervical spine 06/30/2013.  Tooth FINDINGS: CT HEAD FINDINGS Mild generalized atrophy and white matter hypoattenuation is present bilaterally. The basal ganglia  are intact. No acute infarct or hemorrhage is present. There is no significant extra-axial fluid collection. A calcified dural lesion over the right frontal convexity measures 7 x 20 x 16 mm. Additional 6 mm dural-based calcification is adjacent to the right superior sagittal sinus on image 26 of series 2 or 3 No other focal lesions  are present. The ventricles are proportionate to the degree of atrophy Atherosclerotic calcifications are present within the cavernous internal carotid arteries bilaterally. The calvarium is intact. The paranasal sinuses and mastoid air cells are clear. CT MAXILLOFACIAL FINDINGS Right infraorbital soft tissue swelling is present. Bilateral lens replacements are present. The globes are otherwise intact. There is no underlying fracture. The paranasal sinuses and mastoid air cells are clear. Soft tissue swelling is also noted at the level of the chain. A small subcutaneous hematoma is present. There is no underlying fracture. The mandible is intact and located. Anterior fusion at C4-5 is noted. The upper cervical spine is otherwise unremarkable. IMPRESSION: 1. Right infraorbital soft tissue swelling without underlying fracture or globe injury. 2. Soft tissue hematoma and laceration at the level of the chin without an underlying fracture. 3. No acute intracranial abnormality. Mild generalized atrophy and white matter disease is within normal limits for age. 4. 7 x 20 x 16 mm calcified dural-based mass over the right frontal convexity likely represents a meningioma. 5. Probable second 6 mm meningioma adjacent to the superior sagittal sinus. . Electronically Signed   By: San Morelle M.D.   On: 10/09/2015 18:00  Dg Hand Complete Left  Result Date: 10/09/2015 CLINICAL DATA:  Recent fall with hand pain, initial encounter EXAM: LEFT HAND - COMPLETE 3+ VIEW COMPARISON:  None. FINDINGS: Postsurgical changes are noted in the first digit with fusion of the proximal and distal phalanges. Significant periarticular row shins and degenerative changes of the interphalangeal joints are seen consistent with the patient's given clinical history of rheumatoid arthritis. Subluxation of the first [facility] joint is noted also consistent with the given clinical history. No acute fracture or dislocation is noted. IMPRESSION: Chronic  changes without acute abnormality. Electronically Signed   By: Inez Catalina M.D.   On: 10/09/2015 17:47  Ct Maxillofacial Wo Contrast  Result Date: 10/09/2015 CLINICAL DATA:  Tripped and fell today. Hit head on coffee table. Soft tissue swelling to the right orbit and chin. No loss of consciousness. The patient does take a blood thinner. EXAM: CT HEAD WITHOUT CONTRAST CT MAXILLOFACIAL WITHOUT CONTRAST TECHNIQUE: Multidetector CT imaging of the head and maxillofacial structures were performed using the standard protocol without intravenous contrast. Multiplanar CT image reconstructions of the maxillofacial structures were also generated. COMPARISON:  CT of the cervical spine 06/30/2013.  Tooth FINDINGS: CT HEAD FINDINGS Mild generalized atrophy and white matter hypoattenuation is present bilaterally. The basal ganglia are intact. No acute infarct or hemorrhage is present. There is no significant extra-axial fluid collection. A calcified dural lesion over the right frontal convexity measures 7 x 20 x 16 mm. Additional 6 mm dural-based calcification is adjacent to the right superior sagittal sinus on image 26 of series 2 or 3 No other focal lesions are present. The ventricles are proportionate to the degree of atrophy Atherosclerotic calcifications are present within the cavernous internal carotid arteries bilaterally. The calvarium is intact. The paranasal sinuses and mastoid air cells are clear. CT MAXILLOFACIAL FINDINGS Right infraorbital soft tissue swelling is present. Bilateral lens replacements are present. The globes are otherwise intact. There is no underlying fracture. The paranasal  sinuses and mastoid air cells are clear. Soft tissue swelling is also noted at the level of the chain. A small subcutaneous hematoma is present. There is no underlying fracture. The mandible is intact and located. Anterior fusion at C4-5 is noted. The upper cervical spine is otherwise unremarkable. IMPRESSION: 1. Right  infraorbital soft tissue swelling without underlying fracture or globe injury. 2. Soft tissue hematoma and laceration at the level of the chin without an underlying fracture. 3. No acute intracranial abnormality. Mild generalized atrophy and white matter disease is within normal limits for age. 4. 7 x 20 x 16 mm calcified dural-based mass over the right frontal convexity likely represents a meningioma. 5. Probable second 6 mm meningioma adjacent to the superior sagittal sinus. . Electronically Signed   By: San Morelle M.D.   On: 10/09/2015 18:00   Procedures Procedures (including critical care time) LACERATION REPAIR Performed by: Delrae Rend Authorized by: Delrae Rend Consent: Verbal consent obtained. Risks and benefits: risks, benefits and alternatives were discussed Consent given by: patient Patient identity confirmed: provided demographic data Prepped and Draped in normal sterile fashion Wound explored  Laceration Location: left arm  Laceration Length: 5 cm  No Foreign Bodies seen or palpated  Anesthesia: none Irrigation method: syringe Amount of cleaning: standard  Skin closure: steri strips  Patient tolerance: Patient tolerated the procedure well with no immediate complications.  Medications Ordered in ED Medications - No data to display   Initial Impression / Assessment and Plan / ED Course  I have reviewed the triage vital signs and the nursing notes.  Pertinent labs & imaging results that were available during my care of the patient were reviewed by me and considered in my medical decision making (see chart for details).  Clinical Course    Imaging reveals no acute fractures though multiple areas of soft tissue swelling and hematoma. Laceration was cleaned and dressed. Ct head reveals two likely meningiomas. Attending Dr. Laverta Baltimore discussed these findings with pt who will f/u with her neurosurgeon. Pt declines pain meds and declines rx for same. Instructed close  PCP f/u. RICE therapy encouraged. ER return precautions given.  Final Clinical Impressions(s) / ED Diagnoses   Final diagnoses:  Fall, initial encounter  Facial contusion, initial encounter  Arm laceration with complication, left, initial encounter  Meningioma [facility])    New Prescriptions Discharge Medication List as of 10/09/2015  6:45 PM       Anne Ng, PA-C 10/10/15 [place], MD 10/10/15 [phone]

## 2015-10-09 NOTE — ED Triage Notes (Signed)
Pt arrives from home via [facility]. Pt has a hx of multiple knee replacements and is unsteady on her feet. Pt reports to [facility] she stood up out of her chair, tripped on her feet and hit her head on the coffee table. Pt is on Eliquis. Pt has a hematoma over the right eyebrow and to the right chin with an avulsion measuring about 1-1.5 on the left forearm.

## 2015-10-09 NOTE — ED Notes (Signed)
Patient transported to X-ray 

## 2015-10-18 ENCOUNTER — Encounter: Payer: Self-pay | Admitting: Internal Medicine

## 2015-10-18 ENCOUNTER — Ambulatory Visit (INDEPENDENT_AMBULATORY_CARE_PROVIDER_SITE_OTHER): Payer: Medicare Other | Admitting: Internal Medicine

## 2015-10-18 VITALS — BP 130/68 | HR 73 | Temp 98.6°F | Wt 134.0 lb

## 2015-10-18 DIAGNOSIS — I8312 Varicose veins of left lower extremity with inflammation: Secondary | ICD-10-CM | POA: Diagnosis not present

## 2015-10-18 DIAGNOSIS — I83029 Varicose veins of left lower extremity with ulcer of unspecified site: Secondary | ICD-10-CM

## 2015-10-18 DIAGNOSIS — L97929 Non-pressure chronic ulcer of unspecified part of left lower leg with unspecified severity: Principal | ICD-10-CM

## 2015-10-18 DIAGNOSIS — D429 Neoplasm of uncertain behavior of meninges, unspecified: Secondary | ICD-10-CM | POA: Diagnosis not present

## 2015-10-18 DIAGNOSIS — T8454XA Infection and inflammatory reaction due to internal left knee prosthesis, initial encounter: Secondary | ICD-10-CM

## 2015-10-18 DIAGNOSIS — I480 Paroxysmal atrial fibrillation: Secondary | ICD-10-CM | POA: Diagnosis not present

## 2015-10-18 DIAGNOSIS — I8311 Varicose veins of right lower extremity with inflammation: Secondary | ICD-10-CM | POA: Diagnosis not present

## 2015-10-18 DIAGNOSIS — I872 Venous insufficiency (chronic) (peripheral): Secondary | ICD-10-CM

## 2015-10-18 NOTE — Patient Instructions (Signed)
Keep compression wraps on your legs until Thursday unless you develop problems with circulation in your feet.  We will change them Thursday, or, if much improved, give you new directions for management at home until you see the wound care center.    Only take the medications on the list I gave you today (same as the list attached to this document).

## 2015-10-18 NOTE — Progress Notes (Signed)
Location:  [facility] clinic Provider: Josalyn Dettmann L. Mariea Clonts, D.O., C.M.D.  Code Status:  Goals of Care:  Advanced Directives 10/09/2015  Does patient have an advance directive? No  Would patient like information on creating an advanced directive? No - patient declined information  Pre-existing out of facility DNR order (yellow form or pink MOST form) -     Chief Complaint  Patient presents with  . Hospitalization Follow-up    follow-up 2 falls    HPI: Patient is a 81 y.o. female seen today for ED f/u.    I last saw her June 23rd.  She saw Dr. Linus Salmons of [facility] 7/12 who recommended continuing her doxy and cefuroxime for 2 months and then consider suppressive therapy at that point forward.  She returns there in September.    A prior auth was done for eliquis on 09/24/15 and medication was approved.  7/29, she had a fall that took her to the ED.  She apparently stood up from sitting and tripped on her socks.  She fell forwards and caught herself with her hands but hit her face on the coffee table.  Her left elbow was particularly sore with a laceration that required .  She had no fractures.  CT head revealed two meningiomas.  F/u with neurosurgery was recommended if I was in agreement.  RICE therapy for her multiple hematomas/contusions was recommended.  When she arrived today, she was confused about her medications.  She still had a list from [facility] from prior to our last visit, the AVS list from her appt here and a list from her appt with Dr. Linus Salmons.    She is back to taking 40mg  of lasix daily.    She refuses smoking cessation.  Her husband reports confusion at home.  She had been very delirious after her last knee surgery.  She is getting more confusion about medications and other matters at home.    Her husband reports that she has had a hole in her left leg since soon after she came home from [facility].  He had discovered it and put a dressing on it.  They've been changing the dressing regularly and  keeping it clean.  It drains fluid regularly and the left leg is much more edematous than the right with her chronic venous insufficiency.    Left lateral leg is leaking from a dressing.  Says raising her legs is ineffective.  Place is open and not healing.  Left leg is much more swollen than right.  Lasix 40mg  daily is not doing much.  Her husband put the bandage on when he discovered the spot.  ABIs reviewed from 2015 were actually normal despite her smoking.    Past Medical History:  Diagnosis Date  . Anemia   . Anxiety    sometimes   . Bruise    right lower extremity with opsite on  . Chronic back pain    "my entire spine"  . Chronic bronchitis ([place]) 10/31/2011   "prone to it; I have sinus/bronchitis problem probably q yr"  . Complication of anesthesia 2009   htn with block- for hand surgery , pt thinks anesthesia causing some long term confusion  . Depression   . Diarrhea 01/27/2015  . Dizziness   . Dysrhythmia    pt states bring off coumadin for paroxysmal atrial fibrillation for last month  . Edema   . Encounter for long-term (current) use of other medications   . Falls   . H/O clavicle  fracture    left  . Hard of hearing   . History of blood transfusion   . Hypertension    pcp   dr Iretta Mangrum   [facility]  . Hypothyroidism   . Hypothyroidism   . Multiple skin tears 10/31/2011   "get them very easily; my skin is thin and I bruise easily"  . Other and unspecified hyperlipidemia   . Peripheral vascular disease ([place])   . Pneumonia 2008  . Rheumatoid arthritis(714.0)    degenerative disc disease , ra  . Senile osteoporosis   . Spinal stenosis, unspecified region other than cervical   . Superficial thrombophlebitis 04/20/2015  . Swelling of right upper extremity 11/26/2014  . Ulcerative colitis ([place])   . Unspecified glaucoma     Past Surgical History:  Procedure Laterality Date  . ANTERIOR CERVICAL DECOMP/DISCECTOMY FUSION N/A 02/17/2014   Procedure: CERVICAL FOUR TO  FIVE ANTERIOR CERVICAL DECOMPRESSION/DISCECTOMY FUSION 1 LEVEL;  Surgeon: [name], MD;  Location: [place];  Service: Neurosurgery;  Laterality: N/A;  C4-5 Anterior cervical decompression/diskectomy/fusion  . BACK SURGERY    . CATARACT EXTRACTION Left 11/11/2013  . CATARACT EXTRACTION Right 12/30/2013  . EXCISIONAL TOTAL KNEE ARTHROPLASTY WITH ANTIBIOTIC SPACERS Left 03/01/2015   Procedure: RESECTION LEFT TOTAL KNEE ARTHROPLASTY WITH PLACEMENT OF  ANTIBIOTIC SPACERS;  Surgeon: Paralee Cancel, MD;  Location: [facility];  Service: Orthopedics;  Laterality: Left;  . EYE SURGERY Bilateral   . FUNCTIONAL ENDOSCOPIC SINUS SURGERY  1988  . I&D KNEE WITH POLY EXCHANGE Left 10/13/2014   Procedure: INCISION AND DRAINAGE LEFT TOTAL KNEE WITH POLY EXCHANGE;  Surgeon: Paralee Cancel, MD;  Location: [facility];  Service: Orthopedics;  Laterality: Left;  . JOINT REPLACEMENT  2009; 2006   joints 2 fingers,rt hand; joint left thumb  . POSTERIOR FUSION LUMBAR SPINE  10/31/2011   L2-3; 3-4  . POSTERIOR FUSION LUMBAR SPINE  11/2009   L3-4;  L4-5  . POSTERIOR LUMBAR FUSION N/A 08/05/13   T1, T2  . REIMPLANTATION OF TOTAL KNEE Left 07/05/2015   Procedure: REIMPLANTATION OF LEFT TOTAL KNEE WITH REMOVAL OF ANTIBIOTIC SPACER;  Surgeon: Paralee Cancel, MD;  Location: [facility];  Service: Orthopedics;  Laterality: Left;  . SINUS SURGERY WITH INSTATRAK    . SPINE SURGERY    . THYROIDECTOMY, PARTIAL  1997  . TONSILLECTOMY     "when I was a small child"  . TOTAL KNEE ARTHROPLASTY Left 06/16/2014   Procedure: TOTAL KNEE ARTHROPLASTY;  Surgeon: Paralee Cancel, MD;  Location: [facility];  Service: Orthopedics;  Laterality: Left;    No Known Allergies    Medication List       Accurate as of 10/18/15  2:02 PM. Always use your most recent med list.          apixaban 5 MG Tabs tablet Commonly known as:  ELIQUIS Take 1 tablet (5 mg total) by mouth 2 (two) times daily.   balsalazide 750 MG capsule Commonly known as:  COLAZAL TAKE 3  CAPSULES TWICE DAILY   bismuth subsalicylate [id number] suspension Commonly known as:  PEPTO BISMOL Take 15 mLs by mouth 4 (four) times daily -  before meals and at bedtime.   Calcium + D3 600-200 MG-UNIT Tabs Take 1 tablet by mouth daily with breakfast.   celecoxib 200 MG capsule Commonly known as:  CELEBREX TAKE 1 CAPSULE TWICE A DAY   diazepam 5 MG tablet Commonly known as:  VALIUM Take 5 mg by mouth every 12 (  twelve) hours as needed for muscle spasms.   docusate sodium 100 MG capsule Commonly known as:  COLACE Take 1 capsule (100 mg total) by mouth 2 (two) times daily as needed for mild constipation or moderate constipation.   doxycycline 100 MG tablet Commonly known as:  VIBRA-TABS TAKE 1 TABLET TWICE A DAY   estradiol 0.5 MG tablet Commonly known as:  ESTRACE Take 0.5 mg by mouth daily.   ferrous sulfate 325 (65 FE) MG tablet Take 1 tablet (325 mg total) by mouth daily with breakfast.   folic acid 1 MG tablet Commonly known as:  FOLVITE Take 2 mg by mouth daily.   furosemide 40 MG tablet Commonly known as:  LASIX   guaiFENesin 600 MG 12 hr tablet Commonly known as:  MUCINEX Take 600 mg by mouth 2 (two) times daily.   latanoprost 0.005 % ophthalmic solution Commonly known as:  XALATAN Place 1 drop into both eyes at bedtime.   levothyroxine 88 MCG tablet Commonly known as:  SYNTHROID, LEVOTHROID TAKE 1 TABLET EVERY DAY BEFORE BREAKFAST   medroxyPROGESTERone 2.5 MG tablet Commonly known as:  PROVERA Take 2.5 mg by mouth at bedtime.   metoprolol succinate 25 MG 24 hr tablet Commonly known as:  TOPROL-XL Take 1 tablet by mouth 2 (two) times daily.   metoprolol tartrate 25 MG tablet Commonly known as:  LOPRESSOR TAKE 1 TABLET TWICE A DAY WITH MEALS   multivitamin with minerals Tabs tablet Take 1 tablet by mouth daily with lunch.   Oxycodone HCl 10 MG Tabs Take 1 tablet (10 mg total) by mouth 2 (two) times daily as needed (severe pain).     polyethylene glycol packet Commonly known as:  MIRALAX / GLYCOLAX Take 17 g by mouth daily as needed for mild constipation.   Potassium Chloride ER 20 MEQ Tbcr Take 2 tablets by mouth daily.   predniSONE 1 MG tablet Commonly known as:  DELTASONE Take 4 tablets (4 mg total) by mouth daily.   SENNA S PO Take 1 tablet by mouth daily as needed (constipation.).   simethicone 125 MG chewable tablet Commonly known as:  MYLICON Chew 0000000 mg by mouth every 8 (eight) hours as needed.   spironolactone 25 MG tablet Commonly known as:  ALDACTONE TAKE 1 TABLET M, W, FRIDAY ONLY   Vitamin D3 1000 units Caps Take 1,000 Units by mouth daily.       Review of Systems:  Review of Systems  Constitutional: Positive for malaise/fatigue. Negative for chills and fever.  Eyes: Positive for blurred vision.       Glasses, glaucoma  Respiratory: Negative for shortness of breath.   Cardiovascular: Positive for leg swelling. Negative for chest pain and palpitations.  Gastrointestinal: Negative for abdominal pain, blood in stool and melena.       UC  Genitourinary: Negative for dysuria.  Musculoskeletal: Positive for falls and joint pain.  Skin: Negative for itching and rash.  Neurological: Positive for weakness. Negative for dizziness, loss of consciousness and headaches.  Endo/Heme/Allergies: Bruises/bleeds easily.       On eliquis  Psychiatric/Behavioral: Positive for memory loss.       Increased difficulty with short term memory over the past several months since latest surgery    Health Maintenance  Topic Date Due  . ZOSTAVAX  01/26/1997  . PNA vac Low Risk Adult (2 of 2 - PPSV23) 03/27/2015  . INFLUENZA VACCINE  10/12/2015  . TETANUS/TDAP  08/15/2024  . DEXA SCAN  Completed  Physical Exam: Vitals:   10/18/15 1347  BP: 130/68  Pulse: 73  Temp: 98.6 F (37 C)  TempSrc: Oral  SpO2: 97%  Weight: 134 lb (60.8 kg)   Body mass index is 20.99 kg/m. Physical Exam   Constitutional: She is oriented to person, place, and time. She appears well-developed. No distress.  Cardiovascular:  irreg irreg, left leg 3+ pitting edema, right 2+ pitting edema, not wearing compression hose  Pulmonary/Chest: Effort normal and breath sounds normal. No respiratory distress.  Abdominal: Bowel sounds are normal.  Musculoskeletal: She exhibits no tenderness.  Walking with walker, still limps on left leg; left leg larger and knee still warmer than right  Neurological: She is alert and oriented to person, place, and time.  But confused about sequence of events when providing history and husband is filling in  Skin:  Left lower leg with approximately 3cm long and 1cm wide likely venous ulcer with granulation tissue covering it; no surrounding erythema; has light yellow drainage, no odor    Labs reviewed: Basic Metabolic Panel:  Recent Labs  10/29/14 1903  07/07/15 0457 09/03/15 1048 09/22/15 1630  NA 140  < > 143 137 137  K 3.7  < > 4.9 4.1 5.1  CL 105  < > 112* 97 104  CO2 30  < > 23 21 21   GLUCOSE 93  < > 105* 82 90  BUN 19  < > 38* 33* 30*  CREATININE 1.14*  < > 1.24* 0.97 1.07*  CALCIUM 8.5*  < > 8.3* 9.6 9.0  MG 2.3  --   --   --   --   PHOS 3.7  --   --   --   --   TSH 0.851  --   --  0.668  --   < > = values in this interval not displayed. Liver Function Tests:  Recent Labs  12/14/14 1041 12/25/14 1346 07/07/15 0457  AST 37 41* 22  ALT 13 15 7*  ALKPHOS 118* 114 153*  BILITOT 0.4 0.4 0.4  PROT 6.0 5.9* 5.0*  ALBUMIN 4.0 4.0 2.5*   No results for input(s): LIPASE, AMYLASE in the last 8760 hours. No results for input(s): AMMONIA in the last 8760 hours. CBC:  Recent Labs  03/26/15 0405  04/20/15 1451 06/28/15 1430 07/06/15 0508 07/07/15 0457 09/03/15 1048  WBC 5.4  --  6.1 6.9 10.5 11.6* 9.7  NEUTROABS 3.0  --  4.7  --   --   --  8.0*  HGB 8.7*  < > 10.3* 12.0 10.4* 9.6*  --   HCT 28.4*  < > 33.6* 37.9 32.5* 29.3* 34.4  MCV 95.0  --   93.6 87.5 84.0 85.2 90  PLT 247  --  375 269 204 203 380*  < > = values in this interval not displayed. Lipid Panel: No results for input(s): CHOL, HDL, LDLCALC, TRIG, CHOLHDL, LDLDIRECT in the last 8760 hours. No results found for: HGBA1C  Procedures since last visit: Dg Elbow Complete Left  Result Date: 10/09/2015 CLINICAL DATA:  Recent fall with elbow pain, initial encounter EXAM: LEFT ELBOW - COMPLETE 3+ VIEW COMPARISON:  None. FINDINGS: Significant degenerative changes are noted of the elbow joint consistent with the patient's given clinical history of rheumatoid arthritis. Remodeling of the proximal ulna and distal humerus is seen. Some soft tissue swelling is noted. Remodeling of the radial head is noted as well. No definitive acute fracture or dislocation is seen. IMPRESSION: Significant degenerative change  and remodeling in the elbow joint consistent with the given clinical history of rheumatoid arthritis. No definitive acute fracture is seen. Electronically Signed   By: Inez Catalina M.D.   On: 10/09/2015 17:46  Dg Tibia/fibula Right  Result Date: 10/09/2015 CLINICAL DATA:  Recent fall with right lower leg pain, initial encounter EXAM: RIGHT TIBIA AND FIBULA - 2 VIEW COMPARISON:  None. FINDINGS: Mild soft tissue swelling is noted. No acute fracture or dislocation is seen. Diffuse vascular calcifications are noted. IMPRESSION: Mild soft tissue swelling which may be related to the recent injury. No acute bony abnormality is seen. Electronically Signed   By: Inez Catalina M.D.   On: 10/09/2015 17:48  Ct Head Wo Contrast  Result Date: 10/09/2015 CLINICAL DATA:  Tripped and fell today. Hit head on coffee table. Soft tissue swelling to the right orbit and chin. No loss of consciousness. The patient does take a blood thinner. EXAM: CT HEAD WITHOUT CONTRAST CT MAXILLOFACIAL WITHOUT CONTRAST TECHNIQUE: Multidetector CT imaging of the head and maxillofacial structures were performed using the  standard protocol without intravenous contrast. Multiplanar CT image reconstructions of the maxillofacial structures were also generated. COMPARISON:  CT of the cervical spine 06/30/2013.  Tooth FINDINGS: CT HEAD FINDINGS Mild generalized atrophy and white matter hypoattenuation is present bilaterally. The basal ganglia are intact. No acute infarct or hemorrhage is present. There is no significant extra-axial fluid collection. A calcified dural lesion over the right frontal convexity measures 7 x 20 x 16 mm. Additional 6 mm dural-based calcification is adjacent to the right superior sagittal sinus on image 26 of series 2 or 3 No other focal lesions are present. The ventricles are proportionate to the degree of atrophy Atherosclerotic calcifications are present within the cavernous internal carotid arteries bilaterally. The calvarium is intact. The paranasal sinuses and mastoid air cells are clear. CT MAXILLOFACIAL FINDINGS Right infraorbital soft tissue swelling is present. Bilateral lens replacements are present. The globes are otherwise intact. There is no underlying fracture. The paranasal sinuses and mastoid air cells are clear. Soft tissue swelling is also noted at the level of the chain. A small subcutaneous hematoma is present. There is no underlying fracture. The mandible is intact and located. Anterior fusion at C4-5 is noted. The upper cervical spine is otherwise unremarkable. IMPRESSION: 1. Right infraorbital soft tissue swelling without underlying fracture or globe injury. 2. Soft tissue hematoma and laceration at the level of the chin without an underlying fracture. 3. No acute intracranial abnormality. Mild generalized atrophy and white matter disease is within normal limits for age. 4. 7 x 20 x 16 mm calcified dural-based mass over the right frontal convexity likely represents a meningioma. 5. Probable second 6 mm meningioma adjacent to the superior sagittal sinus. . Electronically Signed   By:  San Morelle M.D.   On: 10/09/2015 18:00  Dg Hand Complete Left  Result Date: 10/09/2015 CLINICAL DATA:  Recent fall with hand pain, initial encounter EXAM: LEFT HAND - COMPLETE 3+ VIEW COMPARISON:  None. FINDINGS: Postsurgical changes are noted in the first digit with fusion of the proximal and distal phalanges. Significant periarticular row shins and degenerative changes of the interphalangeal joints are seen consistent with the patient's given clinical history of rheumatoid arthritis. Subluxation of the first [facility] joint is noted also consistent with the given clinical history. No acute fracture or dislocation is noted. IMPRESSION: Chronic changes without acute abnormality. Electronically Signed   By: Inez Catalina M.D.   On: 10/09/2015 17:47  Ct Maxillofacial Wo Contrast  Result Date: 10/09/2015 CLINICAL DATA:  Tripped and fell today. Hit head on coffee table. Soft tissue swelling to the right orbit and chin. No loss of consciousness. The patient does take a blood thinner. EXAM: CT HEAD WITHOUT CONTRAST CT MAXILLOFACIAL WITHOUT CONTRAST TECHNIQUE: Multidetector CT imaging of the head and maxillofacial structures were performed using the standard protocol without intravenous contrast. Multiplanar CT image reconstructions of the maxillofacial structures were also generated. COMPARISON:  CT of the cervical spine 06/30/2013.  Tooth FINDINGS: CT HEAD FINDINGS Mild generalized atrophy and white matter hypoattenuation is present bilaterally. The basal ganglia are intact. No acute infarct or hemorrhage is present. There is no significant extra-axial fluid collection. A calcified dural lesion over the right frontal convexity measures 7 x 20 x 16 mm. Additional 6 mm dural-based calcification is adjacent to the right superior sagittal sinus on image 26 of series 2 or 3 No other focal lesions are present. The ventricles are proportionate to the degree of atrophy Atherosclerotic calcifications are present within  the cavernous internal carotid arteries bilaterally. The calvarium is intact. The paranasal sinuses and mastoid air cells are clear. CT MAXILLOFACIAL FINDINGS Right infraorbital soft tissue swelling is present. Bilateral lens replacements are present. The globes are otherwise intact. There is no underlying fracture. The paranasal sinuses and mastoid air cells are clear. Soft tissue swelling is also noted at the level of the chain. A small subcutaneous hematoma is present. There is no underlying fracture. The mandible is intact and located. Anterior fusion at C4-5 is noted. The upper cervical spine is otherwise unremarkable. IMPRESSION: 1. Right infraorbital soft tissue swelling without underlying fracture or globe injury. 2. Soft tissue hematoma and laceration at the level of the chin without an underlying fracture. 3. No acute intracranial abnormality. Mild generalized atrophy and white matter disease is within normal limits for age. 4. 7 x 20 x 16 mm calcified dural-based mass over the right frontal convexity likely represents a meningioma. 5. Probable second 6 mm meningioma adjacent to the superior sagittal sinus. . Electronically Signed   By: San Morelle M.D.   On: 10/09/2015 18:00  Assessment/Plan 1. Venous ulcer of left leg ([place]) -new onset since last snf stay -is noncompliant with wearing any compression hose and reports that elevating her feet has not been helpful -needs to see wound care center now about this -for now, since ABIs ok last check, will temporarily wrap with unnaboot on left and then have her f/u on Thursday for removal and further instructions --plan to start santyl over the wound bed at that time, but edema is too severe at this time and dressings aren't going to stay on if we don't do some compression  2. Chronic venous stasis dermatitis of both lower extremities -needs compression--we have only one unnaboot (left) so will do one real unnaboot and one wrapped with kerlix  and coban (right) until I see her next on Thursday morning  3. Infection of prosthetic left knee joint ([facility]) -cont doxycycline and cefuroxime as per ID until Sept appt with them when may begin on prophylactic therapy  4. Paroxysmal atrial fibrillation ([facility]) -cont eliquis at this time, no difficulty with rate control  5. Meningiomas, multiple ([place]) -newly noted on CT brain, has not had related symptoms--I'm going to investigate probabilities that these are dangerous for her and if there are concerns, I'll send her to neurosurgery--otherwise, she'd like to avoid seeing another specialist and starting another workup  Labs/tests ordered:  Wound care center referral  Next appt:  12/06/2015  Deaunna Olarte L. Reford Olliff, D.O. [facility] [address], [place] [address] Cell Phone (Mon-Fri 8am-5pm):  [phone] On Call:  [phone] & follow prompts after 5pm & weekends Office Phone:  [phone] Office Fax:  [phone]

## 2015-10-21 ENCOUNTER — Ambulatory Visit (INDEPENDENT_AMBULATORY_CARE_PROVIDER_SITE_OTHER): Payer: Medicare Other | Admitting: Internal Medicine

## 2015-10-21 ENCOUNTER — Encounter: Payer: Self-pay | Admitting: Internal Medicine

## 2015-10-21 VITALS — BP 120/70 | HR 55 | Temp 98.7°F | Wt 133.0 lb

## 2015-10-21 DIAGNOSIS — R413 Other amnesia: Secondary | ICD-10-CM | POA: Diagnosis not present

## 2015-10-21 DIAGNOSIS — I83029 Varicose veins of left lower extremity with ulcer of unspecified site: Secondary | ICD-10-CM | POA: Diagnosis not present

## 2015-10-21 DIAGNOSIS — I8311 Varicose veins of right lower extremity with inflammation: Secondary | ICD-10-CM | POA: Diagnosis not present

## 2015-10-21 DIAGNOSIS — T8454XA Infection and inflammatory reaction due to internal left knee prosthesis, initial encounter: Secondary | ICD-10-CM | POA: Diagnosis not present

## 2015-10-21 DIAGNOSIS — L97929 Non-pressure chronic ulcer of unspecified part of left lower leg with unspecified severity: Principal | ICD-10-CM

## 2015-10-21 DIAGNOSIS — I872 Venous insufficiency (chronic) (peripheral): Secondary | ICD-10-CM

## 2015-10-21 DIAGNOSIS — I8312 Varicose veins of left lower extremity with inflammation: Secondary | ICD-10-CM | POA: Diagnosis not present

## 2015-10-21 MED ORDER — POTASSIUM CHLORIDE ER 20 MEQ PO TBCR: 2.0000 | EXTENDED_RELEASE_TABLET | Freq: Every day | ORAL | 1 refills | Status: DC

## 2015-10-21 MED ORDER — FUROSEMIDE 40 MG PO TABS: 40.0000 mg | ORAL_TABLET | Freq: Every day | ORAL | 1 refills | Status: DC

## 2015-10-21 MED ORDER — MEDROXYPROGESTERONE ACETATE 2.5 MG PO TABS: 2.5000 mg | ORAL_TABLET | Freq: Every day | ORAL | 1 refills | Status: DC

## 2015-10-21 NOTE — Patient Instructions (Addendum)
Home health referral was placed for wound care until your wound care center appt. Keep your unnaboot wraps in place until the home health nurse evaluates you. We applied silvadene, telfa and then wrapped with unnaboots.    Your lasix, potassium, and provera were sent to the pharmacy. You must take your lasix and potassium EVERY day.   Follow a low sodium diet (less than 2000mg  of sodium per day).  Elevate your feet at rest

## 2015-10-21 NOTE — Progress Notes (Signed)
Location:  [facility] clinic Provider:  Aneesah Hernan L. Mariea Clonts, D.O., C.M.D.  Code Status: Full code Goals of Care:  Advanced Directives 10/18/2015  Does patient have an advance directive? No  Would patient like information on creating an advanced directive? -  Pre-existing out of facility DNR order (yellow form or pink MOST form) -     Chief Complaint  Patient presents with  . Medical Management of Chronic Issues    follow-up on legs    HPI: Patient is a 81 y.o. female seen today for medical management of chronic diseases--she is back to f/u on her left leg venous ulcer and venous stasis dermatitis/edema.  On Monday, we wrapped her left leg with a real unnaboot and right with kerlix and coban.    Edema is somewhat improved.  Her wound care center appt is not until late this month.    She is again confused about her medications.  Hasn't taken her lasix for 2 days or potassium.  Says it doesn't help her edema anyway and she pees all the time, but every time she comes she hasn't taken it lately so hard to tell.  Says she can take her pills from bottles and only keeps arthritis pills in a pillbox.  Is mixed up about her meds at each visit and typically out of something.  Her husband reports he has memory loss also and does not feel like he can help with this.  She was counseled on a low sodium diet, but does not sound like she will adhere due to her dietary restriction for her colitis.  She also wanted oxycodone today, but it's not due yet (wanted to bring to pharmacy early but we cannot do that).    Past Medical History:  Diagnosis Date  . Anemia   . Anxiety    sometimes   . Bruise    right lower extremity with opsite on  . Chronic back pain    "my entire spine"  . Chronic bronchitis ([place]) 10/31/2011   "prone to it; I have sinus/bronchitis problem probably q yr"  . Complication of anesthesia 2009   htn with block- for hand surgery , pt thinks anesthesia causing some long term confusion  .  Depression   . Diarrhea 01/27/2015  . Dizziness   . Dysrhythmia    pt states bring off coumadin for paroxysmal atrial fibrillation for last month  . Edema   . Encounter for long-term (current) use of other medications   . Falls   . H/O clavicle fracture    left  . Hard of hearing   . History of blood transfusion   . Hypertension    pcp   dr Mell Mellott   [facility]  . Hypothyroidism   . Hypothyroidism   . Multiple skin tears 10/31/2011   "get them very easily; my skin is thin and I bruise easily"  . Other and unspecified hyperlipidemia   . Peripheral vascular disease ([place])   . Pneumonia 2008  . Rheumatoid arthritis(714.0)    degenerative disc disease , ra  . Senile osteoporosis   . Spinal stenosis, unspecified region other than cervical   . Superficial thrombophlebitis 04/20/2015  . Swelling of right upper extremity 11/26/2014  . Ulcerative colitis ([place])   . Unspecified glaucoma     Past Surgical History:  Procedure Laterality Date  . ANTERIOR CERVICAL DECOMP/DISCECTOMY FUSION N/A 02/17/2014   Procedure: CERVICAL FOUR TO FIVE ANTERIOR CERVICAL DECOMPRESSION/DISCECTOMY FUSION 1 LEVEL;  Surgeon: [name],  MD;  Location: [facility];  Service: Neurosurgery;  Laterality: N/A;  C4-5 Anterior cervical decompression/diskectomy/fusion  . BACK SURGERY    . CATARACT EXTRACTION Left 11/11/2013  . CATARACT EXTRACTION Right 12/30/2013  . EXCISIONAL TOTAL KNEE ARTHROPLASTY WITH ANTIBIOTIC SPACERS Left 03/01/2015   Procedure: RESECTION LEFT TOTAL KNEE ARTHROPLASTY WITH PLACEMENT OF  ANTIBIOTIC SPACERS;  Surgeon: Paralee Cancel, MD;  Location: [facility];  Service: Orthopedics;  Laterality: Left;  . EYE SURGERY Bilateral   . FUNCTIONAL ENDOSCOPIC SINUS SURGERY  1988  . I&D KNEE WITH POLY EXCHANGE Left 10/13/2014   Procedure: INCISION AND DRAINAGE LEFT TOTAL KNEE WITH POLY EXCHANGE;  Surgeon: Paralee Cancel, MD;  Location: [facility];  Service: Orthopedics;  Laterality: Left;  . JOINT REPLACEMENT  2009;  2006   joints 2 fingers,rt hand; joint left thumb  . POSTERIOR FUSION LUMBAR SPINE  10/31/2011   L2-3; 3-4  . POSTERIOR FUSION LUMBAR SPINE  11/2009   L3-4;  L4-5  . POSTERIOR LUMBAR FUSION N/A 08/05/13   T1, T2  . REIMPLANTATION OF TOTAL KNEE Left 07/05/2015   Procedure: REIMPLANTATION OF LEFT TOTAL KNEE WITH REMOVAL OF ANTIBIOTIC SPACER;  Surgeon: Paralee Cancel, MD;  Location: [facility];  Service: Orthopedics;  Laterality: Left;  . SINUS SURGERY WITH INSTATRAK    . SPINE SURGERY    . THYROIDECTOMY, PARTIAL  1997  . TONSILLECTOMY     "when I was a small child"  . TOTAL KNEE ARTHROPLASTY Left 06/16/2014   Procedure: TOTAL KNEE ARTHROPLASTY;  Surgeon: Paralee Cancel, MD;  Location: [facility];  Service: Orthopedics;  Laterality: Left;    No Known Allergies    Medication List       Accurate as of 10/21/15  1:26 PM. Always use your most recent med list.          apixaban 5 MG Tabs tablet Commonly known as:  ELIQUIS Take 1 tablet (5 mg total) by mouth 2 (two) times daily.   balsalazide 750 MG capsule Commonly known as:  COLAZAL TAKE 3 CAPSULES TWICE DAILY   bismuth subsalicylate [id number] suspension Commonly known as:  PEPTO BISMOL Take 15 mLs by mouth 4 (four) times daily -  before meals and at bedtime.   Calcium + D3 600-200 MG-UNIT Tabs Take 1 tablet by mouth daily with breakfast.   cefUROXime 500 MG tablet Commonly known as:  CEFTIN Take 500 mg by mouth 2 (two) times daily with a meal. For 6 mths   celecoxib 200 MG capsule Commonly known as:  CELEBREX TAKE 1 CAPSULE TWICE A DAY   diazepam 5 MG tablet Commonly known as:  VALIUM Take 5 mg by mouth every 12 (twelve) hours as needed for muscle spasms.   docusate sodium 100 MG capsule Commonly known as:  COLACE Take 1 capsule (100 mg total) by mouth 2 (two) times daily as needed for mild constipation or moderate constipation.   doxycycline 100 MG tablet Commonly known as:  VIBRA-TABS TAKE 1 TABLET TWICE A DAY   estradiol  0.5 MG tablet Commonly known as:  ESTRACE Take 0.5 mg by mouth daily.   ferrous sulfate 325 (65 FE) MG tablet Take 325 mg by mouth 2 (two) times daily with a meal.   folic acid 1 MG tablet Commonly known as:  FOLVITE Take 2 mg by mouth daily.   furosemide 40 MG tablet Commonly known as:  LASIX Take 40 mg by mouth daily.   guaiFENesin 600 MG 12 hr tablet Commonly known as:  MUCINEX Take 600 mg by mouth 2 (two) times daily.   latanoprost 0.005 % ophthalmic solution Commonly known as:  XALATAN Place 1 drop into both eyes at bedtime.   levothyroxine 88 MCG tablet Commonly known as:  SYNTHROID, LEVOTHROID TAKE 1 TABLET EVERY DAY BEFORE BREAKFAST   medroxyPROGESTERone 2.5 MG tablet Commonly known as:  PROVERA Take 2.5 mg by mouth at bedtime.   metoprolol tartrate 25 MG tablet Commonly known as:  LOPRESSOR Take 25 mg by mouth 2 (two) times daily.   multivitamin with minerals Tabs tablet Take 1 tablet by mouth daily with lunch.   Oxycodone HCl 10 MG Tabs Take 1 tablet (10 mg total) by mouth 2 (two) times daily as needed (severe pain).   polyethylene glycol packet Commonly known as:  MIRALAX / GLYCOLAX Take 17 g by mouth daily as needed for mild constipation.   Potassium Chloride ER 20 MEQ Tbcr Take 2 tablets by mouth daily.   predniSONE 1 MG tablet Commonly known as:  DELTASONE Take 4 tablets (4 mg total) by mouth daily.   SENNA S PO Take 1 tablet by mouth daily as needed (constipation.).   simethicone 125 MG chewable tablet Commonly known as:  MYLICON Chew 0000000 mg by mouth every 8 (eight) hours as needed.   spironolactone 25 MG tablet Commonly known as:  ALDACTONE TAKE 1 TABLET M, W, FRIDAY ONLY       Review of Systems:  Review of Systems  Constitutional: Positive for malaise/fatigue. Negative for chills and fever.  HENT: Positive for hearing loss. Negative for congestion.   Eyes: Negative for blurred vision.       Glasses  Respiratory: Negative for  cough and shortness of breath.   Cardiovascular: Positive for leg swelling. Negative for chest pain, palpitations, orthopnea, claudication and PND.       Left greater than right chronically; venous insufficiency  Gastrointestinal: Positive for diarrhea. Negative for abdominal pain, blood in stool, constipation and melena.       Colitis  Genitourinary: Negative for dysuria.  Musculoskeletal: Positive for joint pain. Negative for falls and myalgias.  Skin: Negative for itching and rash.       Ulcer left lateral leg  Neurological: Positive for weakness. Negative for dizziness and loss of consciousness.  Endo/Heme/Allergies: Bruises/bleeds easily.  Psychiatric/Behavioral: Positive for memory loss. Negative for depression.    Health Maintenance  Topic Date Due  . ZOSTAVAX  01/26/1997  . PNA vac Low Risk Adult (2 of 2 - PPSV23) 03/27/2015  . INFLUENZA VACCINE  10/12/2015  . TETANUS/TDAP  08/15/2024  . DEXA SCAN  Completed    Physical Exam: Vitals:   10/21/15 1312  BP: 120/70  Pulse: (!) 55  Temp: 98.7 F (37.1 C)  TempSrc: Oral  SpO2: 97%  Weight: 133 lb (60.3 kg)   Body mass index is 20.83 kg/m. Physical Exam  Constitutional: She is oriented to person, place, and time. No distress.  Eyes:  glasses  Neck:  HOH  Cardiovascular: Normal rate, regular rhythm and normal heart sounds.   Pulmonary/Chest: Effort normal and breath sounds normal. No respiratory distress.  Abdominal: Soft. Bowel sounds are normal.  Musculoskeletal:  Left knee larger than right, mildly warm vs. ; using walker  Neurological: She is alert and oriented to person, place, and time.  Short term memory loss and confusion about meds, timeline of events  Skin:  Venous stasis dermatitis of bilateral legs; left more edematous with ulcer of lateral shin with a large amount  of yellow granulation tissue, some yellowish green drainage on the dressing when removed; edema at wrap sites was much improved (but still  present b/c she had skipped her lasix for two days)  Psychiatric: She has a normal mood and affect.    Labs reviewed: Basic Metabolic Panel:  Recent Labs  10/29/14 1903  07/07/15 0457 09/03/15 1048 09/22/15 1630  NA 140  < > 143 137 137  K 3.7  < > 4.9 4.1 5.1  CL 105  < > 112* 97 104  CO2 30  < > 23 21 21   GLUCOSE 93  < > 105* 82 90  BUN 19  < > 38* 33* 30*  CREATININE 1.14*  < > 1.24* 0.97 1.07*  CALCIUM 8.5*  < > 8.3* 9.6 9.0  MG 2.3  --   --   --   --   PHOS 3.7  --   --   --   --   TSH 0.851  --   --  0.668  --   < > = values in this interval not displayed. Liver Function Tests:  Recent Labs  12/14/14 1041 12/25/14 1346 07/07/15 0457  AST 37 41* 22  ALT 13 15 7*  ALKPHOS 118* 114 153*  BILITOT 0.4 0.4 0.4  PROT 6.0 5.9* 5.0*  ALBUMIN 4.0 4.0 2.5*   No results for input(s): LIPASE, AMYLASE in the last 8760 hours. No results for input(s): AMMONIA in the last 8760 hours. CBC:  Recent Labs  03/26/15 0405  04/20/15 1451 06/28/15 1430 07/06/15 0508 07/07/15 0457 09/03/15 1048  WBC 5.4  --  6.1 6.9 10.5 11.6* 9.7  NEUTROABS 3.0  --  4.7  --   --   --  8.0*  HGB 8.7*  < > 10.3* 12.0 10.4* 9.6*  --   HCT 28.4*  < > 33.6* 37.9 32.5* 29.3* 34.4  MCV 95.0  --  93.6 87.5 84.0 85.2 90  PLT 247  --  375 269 204 203 380*  < > = values in this interval not displayed. Lipid Panel: No results for input(s): CHOL, HDL, LDLCALC, TRIG, CHOLHDL, LDLDIRECT in the last 8760 hours. No results found for: HGBA1C  Procedures since last visit: Dg Elbow Complete Left  Result Date: 10/09/2015 CLINICAL DATA:  Recent fall with elbow pain, initial encounter EXAM: LEFT ELBOW - COMPLETE 3+ VIEW COMPARISON:  None. FINDINGS: Significant degenerative changes are noted of the elbow joint consistent with the patient's given clinical history of rheumatoid arthritis. Remodeling of the proximal ulna and distal humerus is seen. Some soft tissue swelling is noted. Remodeling of the radial head  is noted as well. No definitive acute fracture or dislocation is seen. IMPRESSION: Significant degenerative change and remodeling in the elbow joint consistent with the given clinical history of rheumatoid arthritis. No definitive acute fracture is seen. Electronically Signed   By: Inez Catalina M.D.   On: 10/09/2015 17:46  Dg Tibia/fibula Right  Result Date: 10/09/2015 CLINICAL DATA:  Recent fall with right lower leg pain, initial encounter EXAM: RIGHT TIBIA AND FIBULA - 2 VIEW COMPARISON:  None. FINDINGS: Mild soft tissue swelling is noted. No acute fracture or dislocation is seen. Diffuse vascular calcifications are noted. IMPRESSION: Mild soft tissue swelling which may be related to the recent injury. No acute bony abnormality is seen. Electronically Signed   By: Inez Catalina M.D.   On: 10/09/2015 17:48  Ct Head Wo Contrast  Result Date: 10/09/2015 CLINICAL DATA:  Tripped  and fell today. Hit head on coffee table. Soft tissue swelling to the right orbit and chin. No loss of consciousness. The patient does take a blood thinner. EXAM: CT HEAD WITHOUT CONTRAST CT MAXILLOFACIAL WITHOUT CONTRAST TECHNIQUE: Multidetector CT imaging of the head and maxillofacial structures were performed using the standard protocol without intravenous contrast. Multiplanar CT image reconstructions of the maxillofacial structures were also generated. COMPARISON:  CT of the cervical spine 06/30/2013.  Tooth FINDINGS: CT HEAD FINDINGS Mild generalized atrophy and white matter hypoattenuation is present bilaterally. The basal ganglia are intact. No acute infarct or hemorrhage is present. There is no significant extra-axial fluid collection. A calcified dural lesion over the right frontal convexity measures 7 x 20 x 16 mm. Additional 6 mm dural-based calcification is adjacent to the right superior sagittal sinus on image 26 of series 2 or 3 No other focal lesions are present. The ventricles are proportionate to the degree of atrophy  Atherosclerotic calcifications are present within the cavernous internal carotid arteries bilaterally. The calvarium is intact. The paranasal sinuses and mastoid air cells are clear. CT MAXILLOFACIAL FINDINGS Right infraorbital soft tissue swelling is present. Bilateral lens replacements are present. The globes are otherwise intact. There is no underlying fracture. The paranasal sinuses and mastoid air cells are clear. Soft tissue swelling is also noted at the level of the chain. A small subcutaneous hematoma is present. There is no underlying fracture. The mandible is intact and located. Anterior fusion at C4-5 is noted. The upper cervical spine is otherwise unremarkable. IMPRESSION: 1. Right infraorbital soft tissue swelling without underlying fracture or globe injury. 2. Soft tissue hematoma and laceration at the level of the chin without an underlying fracture. 3. No acute intracranial abnormality. Mild generalized atrophy and white matter disease is within normal limits for age. 4. 7 x 20 x 16 mm calcified dural-based mass over the right frontal convexity likely represents a meningioma. 5. Probable second 6 mm meningioma adjacent to the superior sagittal sinus. . Electronically Signed   By: San Morelle M.D.   On: 10/09/2015 18:00  Dg Hand Complete Left  Result Date: 10/09/2015 CLINICAL DATA:  Recent fall with hand pain, initial encounter EXAM: LEFT HAND - COMPLETE 3+ VIEW COMPARISON:  None. FINDINGS: Postsurgical changes are noted in the first digit with fusion of the proximal and distal phalanges. Significant periarticular row shins and degenerative changes of the interphalangeal joints are seen consistent with the patient's given clinical history of rheumatoid arthritis. Subluxation of the first [facility] joint is noted also consistent with the given clinical history. No acute fracture or dislocation is noted. IMPRESSION: Chronic changes without acute abnormality. Electronically Signed   By: Inez Catalina M.D.   On: 10/09/2015 17:47  Ct Maxillofacial Wo Contrast  Result Date: 10/09/2015 CLINICAL DATA:  Tripped and fell today. Hit head on coffee table. Soft tissue swelling to the right orbit and chin. No loss of consciousness. The patient does take a blood thinner. EXAM: CT HEAD WITHOUT CONTRAST CT MAXILLOFACIAL WITHOUT CONTRAST TECHNIQUE: Multidetector CT imaging of the head and maxillofacial structures were performed using the standard protocol without intravenous contrast. Multiplanar CT image reconstructions of the maxillofacial structures were also generated. COMPARISON:  CT of the cervical spine 06/30/2013.  Tooth FINDINGS: CT HEAD FINDINGS Mild generalized atrophy and white matter hypoattenuation is present bilaterally. The basal ganglia are intact. No acute infarct or hemorrhage is present. There is no significant extra-axial fluid collection. A calcified dural lesion over the right frontal convexity  measures 7 x 20 x 16 mm. Additional 6 mm dural-based calcification is adjacent to the right superior sagittal sinus on image 26 of series 2 or 3 No other focal lesions are present. The ventricles are proportionate to the degree of atrophy Atherosclerotic calcifications are present within the cavernous internal carotid arteries bilaterally. The calvarium is intact. The paranasal sinuses and mastoid air cells are clear. CT MAXILLOFACIAL FINDINGS Right infraorbital soft tissue swelling is present. Bilateral lens replacements are present. The globes are otherwise intact. There is no underlying fracture. The paranasal sinuses and mastoid air cells are clear. Soft tissue swelling is also noted at the level of the chain. A small subcutaneous hematoma is present. There is no underlying fracture. The mandible is intact and located. Anterior fusion at C4-5 is noted. The upper cervical spine is otherwise unremarkable. IMPRESSION: 1. Right infraorbital soft tissue swelling without underlying fracture or globe  injury. 2. Soft tissue hematoma and laceration at the level of the chin without an underlying fracture. 3. No acute intracranial abnormality. Mild generalized atrophy and white matter disease is within normal limits for age. 4. 7 x 20 x 16 mm calcified dural-based mass over the right frontal convexity likely represents a meningioma. 5. Probable second 6 mm meningioma adjacent to the superior sagittal sinus. . Electronically Signed   By: San Morelle M.D.   On: 10/09/2015 18:00   Assessment/Plan 1. Venous ulcer of left leg (HCC) - today, area was again cleansed with normal saline, much of the granulation tissue removed, then silvadene and telfa applied, wrapped with unnaboot, both legs with unnaboots for edema -pt advised to keep all dressings in place until home health nursing came to evaluate - Ambulatory referral to [place] - also has upcoming appt with wound care center later in the month, but needs regular monitoring now and will need santyl on the wound once we can get the edema down in her legs  2. Chronic venous stasis dermatitis of both lower extremities - both legs wrapped with unnaboots -elevate at rest -low sodium diet (has been eating a lot of soups and prepared foods) - Ambulatory referral to [facility]  3. Memory loss -she is repeatedly very confused about her medications - discussed pillbox use--apparently she is using an old pillbox and only putting her arthritis meds in it and taking the others from the bottles -each visit she is unsure what she is taking, why, or if she needs refills and her husband admits his memory is worse than hers (not my pt so I don't know) -she is not thrilled about using a pillbox but I encouraged her extensively - she is historically noncompliant with the lasix and potassium (lasix not taking due to having to "pee too much" and potassium she was using some tiny amt of otc potassium instead of the real thing) - Ambulatory referral to [place] for pillbox filling/medication mgt, education about managing edema and low sodium diet  4. Infection of prosthetic left knee joint ([place]) - cont antibiotic as per [facility] and keep f/u with them at which point she may be switched to a prophylactic therapy - Ambulatory referral to [place]  Labs/tests ordered:   Orders Placed This Encounter  Procedures  . Ambulatory referral to Home Health    Referral Priority:   Routine    Referral Type:   Home Health Care    Referral Reason:   Specialty Services Required    Requested Specialty:   [place]  Number of Visits Requested:   1    Next appt:  12/06/2015   Hayley Horn L. Hosam Mcfetridge, D.O. [facility] [address], [place] [address] Cell Phone (Mon-Fri 8am-5pm):  [phone] On Call:  [phone] & follow prompts after 5pm & weekends Office Phone:  [phone] Office Fax:  [phone]

## 2015-10-24 DIAGNOSIS — I480 Paroxysmal atrial fibrillation: Secondary | ICD-10-CM | POA: Diagnosis not present

## 2015-10-24 DIAGNOSIS — I739 Peripheral vascular disease, unspecified: Secondary | ICD-10-CM | POA: Diagnosis not present

## 2015-10-24 DIAGNOSIS — I83228 Varicose veins of left lower extremity with both ulcer of other part of lower extremity and inflammation: Secondary | ICD-10-CM | POA: Diagnosis not present

## 2015-10-24 DIAGNOSIS — I10 Essential (primary) hypertension: Secondary | ICD-10-CM | POA: Diagnosis not present

## 2015-10-24 DIAGNOSIS — L97822 Non-pressure chronic ulcer of other part of left lower leg with fat layer exposed: Secondary | ICD-10-CM | POA: Diagnosis not present

## 2015-10-24 DIAGNOSIS — R413 Other amnesia: Secondary | ICD-10-CM | POA: Diagnosis not present

## 2015-10-24 DIAGNOSIS — R2689 Other abnormalities of gait and mobility: Secondary | ICD-10-CM | POA: Diagnosis not present

## 2015-10-24 DIAGNOSIS — I8311 Varicose veins of right lower extremity with inflammation: Secondary | ICD-10-CM | POA: Diagnosis not present

## 2015-10-25 ENCOUNTER — Other Ambulatory Visit: Payer: Self-pay | Admitting: Internal Medicine

## 2015-10-26 ENCOUNTER — Other Ambulatory Visit: Payer: Self-pay | Admitting: Internal Medicine

## 2015-10-27 ENCOUNTER — Telehealth: Payer: Self-pay

## 2015-10-27 NOTE — Telephone Encounter (Signed)
Discussed response with Janett Billow. Phone note to be faxed to [phone]

## 2015-10-27 NOTE — Telephone Encounter (Signed)
I would suggest santyl over the left leg wound base, dressing, then unnaboot wraps.

## 2015-10-27 NOTE — Telephone Encounter (Signed)
[name] with [facility] called requesting additional orders for wound treatment. [name] thinks the patient needs more than the UNA boot. Patient may need cream applied and a wrapping, this can be in combination with the UNA boot or alone.  Please advise

## 2015-10-29 ENCOUNTER — Other Ambulatory Visit: Payer: Self-pay | Admitting: *Deleted

## 2015-10-29 MED ORDER — COLLAGENASE 250 UNIT/GM EX OINT: 1.0000 "application " | TOPICAL_OINTMENT | Freq: Every day | CUTANEOUS | 0 refills | Status: DC

## 2015-10-29 NOTE — Telephone Encounter (Signed)
Duplicate phone note opened in error 

## 2015-10-29 NOTE — Telephone Encounter (Signed)
[facility] wants Santyl to be faxed to patients pharmacy. Faxed.

## 2015-11-09 ENCOUNTER — Encounter (HOSPITAL_BASED_OUTPATIENT_CLINIC_OR_DEPARTMENT_OTHER): Payer: Medicare Other

## 2015-11-09 ENCOUNTER — Other Ambulatory Visit: Payer: Self-pay | Admitting: Internal Medicine

## 2015-11-12 ENCOUNTER — Other Ambulatory Visit: Payer: Self-pay | Admitting: Internal Medicine

## 2015-11-16 ENCOUNTER — Other Ambulatory Visit: Payer: Self-pay | Admitting: Internal Medicine

## 2015-11-17 ENCOUNTER — Encounter (HOSPITAL_BASED_OUTPATIENT_CLINIC_OR_DEPARTMENT_OTHER): Payer: Medicare Other | Attending: Surgery

## 2015-11-17 DIAGNOSIS — I10 Essential (primary) hypertension: Secondary | ICD-10-CM | POA: Diagnosis not present

## 2015-11-17 DIAGNOSIS — F17210 Nicotine dependence, cigarettes, uncomplicated: Secondary | ICD-10-CM | POA: Insufficient documentation

## 2015-11-17 DIAGNOSIS — I890 Lymphedema, not elsewhere classified: Secondary | ICD-10-CM | POA: Insufficient documentation

## 2015-11-17 DIAGNOSIS — H409 Unspecified glaucoma: Secondary | ICD-10-CM | POA: Diagnosis not present

## 2015-11-17 DIAGNOSIS — M069 Rheumatoid arthritis, unspecified: Secondary | ICD-10-CM | POA: Diagnosis not present

## 2015-11-17 DIAGNOSIS — L97821 Non-pressure chronic ulcer of other part of left lower leg limited to breakdown of skin: Secondary | ICD-10-CM | POA: Diagnosis not present

## 2015-11-17 DIAGNOSIS — L03116 Cellulitis of left lower limb: Secondary | ICD-10-CM | POA: Insufficient documentation

## 2015-11-17 NOTE — Telephone Encounter (Signed)
Pt should not be out of her ceftin until about 11/26/15 (last filled 10/26/15).  It was supposed to carry her until she sees Dr.Comer at [facility] and he can transition her to suppressive therapy.  Her appt with him is 9/12.  Does she have enough tablets to get to that appt?

## 2015-11-18 ENCOUNTER — Telehealth: Payer: Self-pay

## 2015-11-18 NOTE — Telephone Encounter (Signed)
Elmyra with [facility] called requesting verbal order for PT 2 x weekly for 3 weeks then 1 x weekly for 1 week.   Elmyra also requested order to initiate OT for left shoulder pain  Last OV 10-21-15, pending OV 11-25-15   Per [facility] standing order, verbal order given. Message will be sent to patient's provider as a FYI.

## 2015-11-22 ENCOUNTER — Other Ambulatory Visit: Payer: Self-pay | Admitting: Internal Medicine

## 2015-11-23 ENCOUNTER — Ambulatory Visit (INDEPENDENT_AMBULATORY_CARE_PROVIDER_SITE_OTHER): Payer: Medicare Other | Admitting: Internal Medicine

## 2015-11-23 ENCOUNTER — Encounter: Payer: Self-pay | Admitting: Internal Medicine

## 2015-11-23 DIAGNOSIS — T8454XA Infection and inflammatory reaction due to internal left knee prosthesis, initial encounter: Secondary | ICD-10-CM

## 2015-11-23 DIAGNOSIS — R197 Diarrhea, unspecified: Secondary | ICD-10-CM | POA: Diagnosis not present

## 2015-11-23 MED ORDER — CEFUROXIME AXETIL 500 MG PO TABS: 500.0000 mg | ORAL_TABLET | Freq: Every day | ORAL | 11 refills | Status: DC

## 2015-11-23 NOTE — Assessment & Plan Note (Addendum)
At this time, I will have her transition to chronic suppression with once daily cefuroxime.  She feels she can't tolerate more antibiotics and I feel suppression with cefuroxime will be her best option.  There was no growth of MRSA and will see her back in 6 months.

## 2015-11-23 NOTE — Progress Notes (Signed)
   Subjective:    Patient ID: [name], female    DOB: [birth year], 81 y.o.   MRN: [id number]  HPI  Here for follow up of PJI.  [name] is a 81 y.o. female with RA, PVD, hypothyroidsim, afib on coumadin who presented for reimplantation of her TKA on the left. She initially underwent left TKA in April [id number] and some complications with cellulitis treated with clindamycin and then developed left knee pain, concerned for infection and underwent I and D with polyexchange in August 2016. She completed 6 weeks of IV therapy with vancomycin and ceftriaxone and continue with doxycycline and cephalexin. She did have some diarrhea but C diff was negative and it resolved once cephalexin was stopped. In December, 2016, aspiration was done and underwent resection arthroplasty with antibiotic spacer placement. At that time, her tibial component could not safely be removed due to cracking so was left in place. I then placed her on vancomycin and ceftriaxone for 28 days and then transitioned to oral therapy. She then presented for reimplantation but she had elevated WBC in her synovial fluid with an elevated CRP and we opted to reinitiate therapy with 6 weeks of IV antibiotics which she completed on June 5th.  She then transitioned to oral doxycycline and cefuroxime for 3-6 months and taking that.   Inflammatory markers done last visit were not significantly worse or changed from before. She feels she is having a lot of muscle weakness due to the antibiotics.  She is hopeful to reduce some antibiotics.  She overall feels her health is declining, exacerbated by significant pain related to her RA.       Review of Systems  Gastrointestinal: Negative for diarrhea and nausea.  Skin: Negative for rash.  Neurological: Negative for dizziness and light-headedness.       Objective:   Physical Exam  Constitutional: She appears well-developed and well-nourished. No distress.  Eyes: No scleral icterus.    Cardiovascular: Normal rate, regular rhythm and normal heart sounds.   Musculoskeletal:  Deformities related to RA  Skin: No rash noted.          Assessment & Plan:

## 2015-11-25 ENCOUNTER — Ambulatory Visit (INDEPENDENT_AMBULATORY_CARE_PROVIDER_SITE_OTHER): Payer: Medicare Other | Admitting: Internal Medicine

## 2015-11-25 ENCOUNTER — Encounter: Payer: Self-pay | Admitting: Internal Medicine

## 2015-11-25 VITALS — BP 122/80 | HR 76 | Wt 128.0 lb

## 2015-11-25 DIAGNOSIS — R531 Weakness: Secondary | ICD-10-CM

## 2015-11-25 DIAGNOSIS — M05711 Rheumatoid arthritis with rheumatoid factor of right shoulder without organ or systems involvement: Secondary | ICD-10-CM | POA: Diagnosis not present

## 2015-11-25 DIAGNOSIS — I8312 Varicose veins of left lower extremity with inflammation: Secondary | ICD-10-CM | POA: Diagnosis not present

## 2015-11-25 DIAGNOSIS — I83029 Varicose veins of left lower extremity with ulcer of unspecified site: Secondary | ICD-10-CM | POA: Diagnosis not present

## 2015-11-25 DIAGNOSIS — L97929 Non-pressure chronic ulcer of unspecified part of left lower leg with unspecified severity: Secondary | ICD-10-CM

## 2015-11-25 DIAGNOSIS — I8311 Varicose veins of right lower extremity with inflammation: Secondary | ICD-10-CM

## 2015-11-25 DIAGNOSIS — T8454XA Infection and inflammatory reaction due to internal left knee prosthesis, initial encounter: Secondary | ICD-10-CM | POA: Diagnosis not present

## 2015-11-25 DIAGNOSIS — I872 Venous insufficiency (chronic) (peripheral): Secondary | ICD-10-CM

## 2015-11-25 MED ORDER — OXYCODONE HCL 10 MG PO TABS: 10.0000 mg | ORAL_TABLET | Freq: Three times a day (TID) | ORAL | 0 refills | Status: DC | PRN

## 2015-11-25 MED ORDER — OXYCODONE HCL 10 MG PO TABS: 10.0000 mg | ORAL_TABLET | Freq: Two times a day (BID) | ORAL | 0 refills | Status: DC | PRN

## 2015-11-25 NOTE — Progress Notes (Signed)
Location:  [facility] clinic Provider:  Adriyana Greenbaum L. Mariea Clonts, D.O., C.M.D.  Code Status:  DNR  About 20 mins were spent on goals of care today.  Her husband is her decision maker but she has still not filled in hcpoa paperwork or living will.  We did her DNR form today.  She feels her QOL is really poor and she can't take care of herself anymore.  She is wondering if she needs to move to a SNF b/c she cannot get upstairs anymore and has been sleeping on her couch.  She can hardly get up out of the chair and needed help to use the restroom today.  She agrees to addition of an Therapist, sports for her wound care and social work to help with planning for either more help at home or snf placement.  She wants to discuss it more with her husband first.  Goals of Care:  Advanced Directives 10/18/2015  Does patient have an advance directive? No  Would patient like information on creating an advanced directive? -  Pre-existing out of facility DNR order (yellow form or pink MOST form) -   Chief Complaint  Patient presents with  . Medical Management of Chronic Issues    1 mth follow-up    HPI: Patient is a 81 y.o. female seen today for medical management of chronic diseases.    She is getting increasingly weak over time.  She is actively getting PT at home.    She is now on only cefuroxime daily since her visit with ID. She has been taking her lasix every other day.  Edema is still improved.    She can hardly move her arms and Dr. Veverly Fells noted her arthritis being bad, partial rotator cuff tears and not helpful.    Has not been using strong pain medications.  Had been on oxycodone 10mg  po bid for her pain.  She's been out for weeks.    She was given a case of ensure today.  Appetite is ok.  They do not have a protein rich diet.    She was getting severe pain in her leg since using silver alginate.  It keeps her awake with pain.  She needs the home health nurse to come back out for her leg b/c she canceled her wound care  appt b/c the silver alginate hurts.    Past Medical History:  Diagnosis Date  . Anemia   . Anxiety    sometimes   . Bruise    right lower extremity with opsite on  . Chronic back pain    "my entire spine"  . Chronic bronchitis ([place]) 10/31/2011   "prone to it; I have sinus/bronchitis problem probably q yr"  . Complication of anesthesia 2009   htn with block- for hand surgery , pt thinks anesthesia causing some long term confusion  . Depression   . Diarrhea 01/27/2015  . Dizziness   . Dysrhythmia    pt states bring off coumadin for paroxysmal atrial fibrillation for last month  . Edema   . Encounter for long-term (current) use of other medications   . Falls   . H/O clavicle fracture    left  . Hard of hearing   . History of blood transfusion   . Hypertension    pcp   dr Jennafer Gladue   [facility]  . Hypothyroidism   . Hypothyroidism   . Multiple skin tears 10/31/2011   "get them very easily; my skin is thin and  I bruise easily"  . Other and unspecified hyperlipidemia   . Peripheral vascular disease ([place])   . Pneumonia 2008  . Rheumatoid arthritis(714.0)    degenerative disc disease , ra  . Senile osteoporosis   . Spinal stenosis, unspecified region other than cervical   . Superficial thrombophlebitis 04/20/2015  . Swelling of right upper extremity 11/26/2014  . Ulcerative colitis ([place])   . Unspecified glaucoma     Past Surgical History:  Procedure Laterality Date  . ANTERIOR CERVICAL DECOMP/DISCECTOMY FUSION N/A 02/17/2014   Procedure: CERVICAL FOUR TO FIVE ANTERIOR CERVICAL DECOMPRESSION/DISCECTOMY FUSION 1 LEVEL;  Surgeon: [name], MD;  Location: [place] [facility];  Service: Neurosurgery;  Laterality: N/A;  C4-5 Anterior cervical decompression/diskectomy/fusion  . BACK SURGERY    . CATARACT EXTRACTION Left 11/11/2013  . CATARACT EXTRACTION Right 12/30/2013  . EXCISIONAL TOTAL KNEE ARTHROPLASTY WITH ANTIBIOTIC SPACERS Left 03/01/2015   Procedure: RESECTION LEFT TOTAL KNEE  ARTHROPLASTY WITH PLACEMENT OF  ANTIBIOTIC SPACERS;  Surgeon: Paralee Cancel, MD;  Location: [facility];  Service: Orthopedics;  Laterality: Left;  . EYE SURGERY Bilateral   . FUNCTIONAL ENDOSCOPIC SINUS SURGERY  1988  . I&D KNEE WITH POLY EXCHANGE Left 10/13/2014   Procedure: INCISION AND DRAINAGE LEFT TOTAL KNEE WITH POLY EXCHANGE;  Surgeon: Paralee Cancel, MD;  Location: [facility];  Service: Orthopedics;  Laterality: Left;  . JOINT REPLACEMENT  2009; 2006   joints 2 fingers,rt hand; joint left thumb  . POSTERIOR FUSION LUMBAR SPINE  10/31/2011   L2-3; 3-4  . POSTERIOR FUSION LUMBAR SPINE  11/2009   L3-4;  L4-5  . POSTERIOR LUMBAR FUSION N/A 08/05/13   T1, T2  . REIMPLANTATION OF TOTAL KNEE Left 07/05/2015   Procedure: REIMPLANTATION OF LEFT TOTAL KNEE WITH REMOVAL OF ANTIBIOTIC SPACER;  Surgeon: Paralee Cancel, MD;  Location: [facility];  Service: Orthopedics;  Laterality: Left;  . SINUS SURGERY WITH INSTATRAK    . SPINE SURGERY    . THYROIDECTOMY, PARTIAL  1997  . TONSILLECTOMY     "when I was a small child"  . TOTAL KNEE ARTHROPLASTY Left 06/16/2014   Procedure: TOTAL KNEE ARTHROPLASTY;  Surgeon: Paralee Cancel, MD;  Location: [facility];  Service: Orthopedics;  Laterality: Left;    No Known Allergies    Medication List       Accurate as of 11/25/15  3:13 PM. Always use your most recent med list.          apixaban 5 MG Tabs tablet Commonly known as:  ELIQUIS Take 1 tablet (5 mg total) by mouth 2 (two) times daily.   balsalazide 750 MG capsule Commonly known as:  COLAZAL TAKE 3 CAPSULES TWICE DAILY   bismuth subsalicylate [id number] suspension Commonly known as:  PEPTO BISMOL Take 15 mLs by mouth 4 (four) times daily -  before meals and at bedtime.   Calcium + D3 600-200 MG-UNIT Tabs Take 1 tablet by mouth daily with breakfast.   cefUROXime 500 MG tablet Commonly known as:  CEFTIN Take 1 tablet (500 mg total) by mouth daily.   celecoxib 200 MG capsule Commonly known as:  CELEBREX TAKE 1  CAPSULE TWICE A DAY   collagenase ointment Commonly known as:  SANTYL Apply 1 application topically daily. For wound   diazepam 5 MG tablet Commonly known as:  VALIUM Take 5 mg by mouth every 12 (twelve) hours as needed for muscle spasms.   docusate sodium 100 MG capsule Commonly known as:  COLACE Take 1 capsule (100  mg total) by mouth 2 (two) times daily as needed for mild constipation or moderate constipation.   estradiol 0.5 MG tablet Commonly known as:  ESTRACE Take 0.5 mg by mouth daily.   ferrous sulfate 325 (65 FE) MG tablet Take 325 mg by mouth 2 (two) times daily with a meal.   folic acid 1 MG tablet Commonly known as:  FOLVITE Take 2 mg by mouth daily.   furosemide 40 MG tablet Commonly known as:  LASIX Take 1 tablet (40 mg total) by mouth daily.   guaiFENesin 600 MG 12 hr tablet Commonly known as:  MUCINEX Take 600 mg by mouth 2 (two) times daily.   latanoprost 0.005 % ophthalmic solution Commonly known as:  XALATAN Place 1 drop into both eyes at bedtime.   levothyroxine 88 MCG tablet Commonly known as:  SYNTHROID, LEVOTHROID TAKE 1 TABLET EVERY DAY BEFORE BREAKFAST   medroxyPROGESTERone 2.5 MG tablet Commonly known as:  PROVERA Take 1 tablet (2.5 mg total) by mouth at bedtime.   metoprolol tartrate 25 MG tablet Commonly known as:  LOPRESSOR Take 25 mg by mouth 2 (two) times daily.   multivitamin with minerals Tabs tablet Take 1 tablet by mouth daily with lunch.   Oxycodone HCl 10 MG Tabs Take 1 tablet (10 mg total) by mouth 2 (two) times daily as needed (severe pain).   polyethylene glycol packet Commonly known as:  MIRALAX / GLYCOLAX Take 17 g by mouth daily as needed for mild constipation.   Potassium Chloride ER 20 MEQ Tbcr Take 2 tablets by mouth daily.   predniSONE 1 MG tablet Commonly known as:  DELTASONE Take 4 tablets (4 mg total) by mouth daily.   SENNA S PO Take 1 tablet by mouth daily as needed (constipation.).   simethicone  125 MG chewable tablet Commonly known as:  MYLICON Chew 0000000 mg by mouth every 8 (eight) hours as needed.   spironolactone 25 MG tablet Commonly known as:  ALDACTONE TAKE 1 TABLET MONDAY, WEDNESDAY, FRIDAY ONLY       Review of Systems:  Review of Systems  Constitutional: Positive for malaise/fatigue and weight loss. Negative for chills, diaphoresis and fever.  HENT: Positive for hearing loss.   Eyes: Negative for blurred vision.       Glasses  Respiratory: Positive for cough and wheezing. Negative for hemoptysis, sputum production and shortness of breath.   Cardiovascular: Negative for chest pain, palpitations and leg swelling.       Edema improved with diuretics and compression of left leg  Gastrointestinal: Negative for abdominal pain, blood in stool, constipation and melena.  Genitourinary: Negative for dysuria, frequency and urgency.  Musculoskeletal: Positive for joint pain. Negative for falls and myalgias.       Left knee pain and warmth, bilateral shoulder pain and decreased ROM  Skin: Negative for itching and rash.  Neurological: Positive for weakness. Negative for dizziness and loss of consciousness.  Endo/Heme/Allergies: Bruises/bleeds easily.  Psychiatric/Behavioral: Positive for depression and memory loss.    Health Maintenance  Topic Date Due  . ZOSTAVAX  01/26/1997  . PNA vac Low Risk Adult (2 of 2 - PPSV23) 03/27/2015  . INFLUENZA VACCINE  10/12/2015  . TETANUS/TDAP  08/15/2024  . DEXA SCAN  Completed    Physical Exam: Vitals:   11/25/15 1440  BP: 122/80  Pulse: 76  SpO2: 94%  Weight: 128 lb (58.1 kg)   Body mass index is 20.05 kg/m. Physical Exam  Constitutional: She is oriented to person,  place, and time. No distress.  Cardiovascular: Normal rate, regular rhythm, normal heart sounds and intact distal pulses.   Pulmonary/Chest: Effort normal and breath sounds normal.  Abdominal: Soft. Bowel sounds are normal.  Musculoskeletal:  Limited rom of  shoulders bilaterally; warmth of left knee and swelling of knee  Neurological: She is alert and oriented to person, place, and time.  Confusion improved vs. Last visit  Skin: Skin is warm and dry.  Left leg is wrapped in unnaboot and pt refused to have it undressed knowing RN is to come look at it at home  Psychiatric: She has a normal mood and affect.    Labs reviewed: Basic Metabolic Panel:  Recent Labs  07/07/15 0457 09/03/15 1048 09/22/15 1630  NA 143 137 137  K 4.9 4.1 5.1  CL 112* 97 104  CO2 23 21 21   GLUCOSE 105* 82 90  BUN 38* 33* 30*  CREATININE 1.24* 0.97 1.07*  CALCIUM 8.3* 9.6 9.0  TSH  --  0.668  --    Liver Function Tests:  Recent Labs  12/14/14 1041 12/25/14 1346 07/07/15 0457  AST 37 41* 22  ALT 13 15 7*  ALKPHOS 118* 114 153*  BILITOT 0.4 0.4 0.4  PROT 6.0 5.9* 5.0*  ALBUMIN 4.0 4.0 2.5*   No results for input(s): LIPASE, AMYLASE in the last 8760 hours. No results for input(s): AMMONIA in the last 8760 hours. CBC:  Recent Labs  03/26/15 0405  04/20/15 1451 06/28/15 1430 07/06/15 0508 07/07/15 0457 09/03/15 1048  WBC 5.4  --  6.1 6.9 10.5 11.6* 9.7  NEUTROABS 3.0  --  4.7  --   --   --  8.0*  HGB 8.7*  < > 10.3* 12.0 10.4* 9.6*  --   HCT 28.4*  < > 33.6* 37.9 32.5* 29.3* 34.4  MCV 95.0  --  93.6 87.5 84.0 85.2 90  PLT 247  --  375 269 204 203 380*  < > = values in this interval not displayed.  Assessment/Plan 1. Infection of prosthetic left knee joint ([place]) - now down to cefuroxime daily as prophylaxis - increase oxycodone to tid prn from bid prn - she is to only receive her pain meds from me - she also takes celebrex - Oxycodone HCl 10 MG TABS; Take 1 tablet (10 mg total) by mouth 3 (three) times daily as needed (severe pain).  Dispense: 90 tablet; Refill: 0  2. Rheumatoid arthritis involving right shoulder with positive rheumatoid factor (HCC) - is getting PT at home after seeing orthopedics - Oxycodone HCl 10 MG TABS; Take 1  tablet (10 mg total) by mouth 3 (three) times daily as needed (severe pain).  Dispense: 90 tablet; Refill: 0  3. Chronic venous stasis dermatitis of both lower extremities -continue her daily lasix 40mg --she needs to keep taking this -continue unnaboot wrap of left leg and recs per wound care center  4. Venous ulcer of left leg (HCC) -seen at [facility], but does not want to go back b/c she says silvadene burns her wound and keeps her awake at night  5. Generalized weakness -continue PT  Labs/tests ordered:  No orders of the defined types were placed in this encounter.  Next appt:  12/06/2015  Camarion Weier L. Alaney Witter, D.O. [facility] [address], [place] [address] Cell Phone (Mon-Fri 8am-5pm):  [phone] On Call:  [phone] & follow prompts after 5pm & weekends Office Phone:  [phone] Office Fax:  [phone]   

## 2015-11-25 NOTE — Assessment & Plan Note (Signed)
Resolved at this time.  °

## 2015-11-27 ENCOUNTER — Other Ambulatory Visit: Payer: Self-pay | Admitting: Internal Medicine

## 2015-11-30 ENCOUNTER — Telehealth: Payer: Self-pay | Admitting: *Deleted

## 2015-11-30 NOTE — Telephone Encounter (Signed)
[name] with [facility] called and stated that she had some concerns regarding the patient. Stated patient went one time to the wound clinic and patient decided not to go back. The center will not write orders. Went on the 6th and was suppose to go back on the 13th and patient stated that she will not go back. [name] wants to know if you will write orders for wound care for legs. Has wrapped legs with Unna boot over the weekend but they don't think the legs need to be wrapped just a hydrogel in the would bed and cover. Please Advise.

## 2015-11-30 NOTE — Telephone Encounter (Signed)
Janett Billow, [facility] nurse notified and agreed. Stated that she will discuss with patient that it is important for Dr. Mariea Clonts to check her wound at her appointment on 12/06/15.

## 2015-11-30 NOTE — Telephone Encounter (Signed)
It is ok for them to try the hydrogel in the wound bed and cover with a dry dressing and wrap with kerlix.  I'm concerned her legs will swell back up w/o the unnaboot.  She is not faithful with her lasix as she should be and has some memory impairment developing.  She did not want me to look at her wound at her last appointment b/c of nursing coming out to address it!  I suggested that I look at it so I could give orders since she refuses to go back to the wound care center, but she was ready to go home.

## 2015-12-06 ENCOUNTER — Ambulatory Visit (INDEPENDENT_AMBULATORY_CARE_PROVIDER_SITE_OTHER): Payer: Medicare Other | Admitting: Internal Medicine

## 2015-12-06 ENCOUNTER — Encounter: Payer: Self-pay | Admitting: Internal Medicine

## 2015-12-06 VITALS — BP 130/60 | HR 60 | Temp 98.6°F | Wt 135.0 lb

## 2015-12-06 DIAGNOSIS — R6889 Other general symptoms and signs: Secondary | ICD-10-CM

## 2015-12-06 DIAGNOSIS — E039 Hypothyroidism, unspecified: Secondary | ICD-10-CM

## 2015-12-06 DIAGNOSIS — I872 Venous insufficiency (chronic) (peripheral): Secondary | ICD-10-CM | POA: Diagnosis not present

## 2015-12-06 DIAGNOSIS — L97929 Non-pressure chronic ulcer of unspecified part of left lower leg with unspecified severity: Principal | ICD-10-CM

## 2015-12-06 DIAGNOSIS — I83029 Varicose veins of left lower extremity with ulcer of unspecified site: Secondary | ICD-10-CM | POA: Diagnosis not present

## 2015-12-06 NOTE — Progress Notes (Signed)
Location:  [facility] clinic Provider:  Sharhonda Atwood L. Mariea Clonts, D.O., C.M.D.  Code Status: DNR Goals of Care:  Advanced Directives 10/18/2015  Does patient have an advance directive? No  Would patient like information on creating an advanced directive? -  Pre-existing out of facility DNR order (yellow form or pink MOST form) -   Chief Complaint  Patient presents with  . Medical Management of Chronic Issues    99mth follow-up    HPI: Patient is a 81 y.o. [name] seen today for medical management of chronic diseases.    Notes, she is on just the one abx now, is drinking 1/2 of a boost per day and has been able to get around better and not dizzy.  Has gained a little weight.  She is up 7 lbs.    Wraps are off and both legs are more edematous.  She has been elevating them.  Unfortunately, she and her husband eat primarily canned foods high in sodium.    Left lateral lower leg wound remains, but improved.  There was a bunch of drama with the wound care center b/c pt didn't like how the silver made her wound feel so she just wanted me to address it and not to see them, but didn't let me look at it last time.  Home health nurse could not do anything but redress it b/c she did not have orders.    Past Medical History:  Diagnosis Date  . Anemia   . Anxiety    sometimes   . Bruise    right lower extremity with opsite on  . Chronic back pain    "my entire spine"  . Chronic bronchitis ([place]) 10/31/2011   "prone to it; I have sinus/bronchitis problem probably q yr"  . Complication of anesthesia 2009   htn with block- for hand surgery , pt thinks anesthesia causing some long term confusion  . Depression   . Diarrhea 01/27/2015  . Dizziness   . Dysrhythmia    pt states bring off coumadin for paroxysmal atrial fibrillation for last month  . Edema   . Encounter for long-term (current) use of other medications   . Falls   . H/O clavicle fracture    left  . Hard of hearing   . History of blood  transfusion   . Hypertension    pcp   dr Virgil Lightner   [facility]  . Hypothyroidism   . Hypothyroidism   . Multiple skin tears 10/31/2011   "get them very easily; my skin is thin and I bruise easily"  . Other and unspecified hyperlipidemia   . Peripheral vascular disease ([place])   . Pneumonia 2008  . Rheumatoid arthritis(714.0)    degenerative disc disease , ra  . Senile osteoporosis   . Spinal stenosis, unspecified region other than cervical   . Superficial thrombophlebitis 04/20/2015  . Swelling of right upper extremity 11/26/2014  . Ulcerative colitis ([place])   . Unspecified glaucoma     Past Surgical History:  Procedure Laterality Date  . ANTERIOR CERVICAL DECOMP/DISCECTOMY FUSION N/A 02/17/2014   Procedure: CERVICAL FOUR TO FIVE ANTERIOR CERVICAL DECOMPRESSION/DISCECTOMY FUSION 1 LEVEL;  Surgeon: [name], MD;  Location: [place] [facility];  Service: Neurosurgery;  Laterality: N/A;  C4-5 Anterior cervical decompression/diskectomy/fusion  . BACK SURGERY    . CATARACT EXTRACTION Left 11/11/2013  . CATARACT EXTRACTION Right 12/30/2013  . EXCISIONAL TOTAL KNEE ARTHROPLASTY WITH ANTIBIOTIC SPACERS Left 03/01/2015   Procedure: RESECTION LEFT TOTAL KNEE ARTHROPLASTY  WITH PLACEMENT OF  ANTIBIOTIC SPACERS;  Surgeon: Paralee Cancel, MD;  Location: [facility];  Service: Orthopedics;  Laterality: Left;  . EYE SURGERY Bilateral   . FUNCTIONAL ENDOSCOPIC SINUS SURGERY  1988  . I&D KNEE WITH POLY EXCHANGE Left 10/13/2014   Procedure: INCISION AND DRAINAGE LEFT TOTAL KNEE WITH POLY EXCHANGE;  Surgeon: Paralee Cancel, MD;  Location: [facility];  Service: Orthopedics;  Laterality: Left;  . JOINT REPLACEMENT  2009; 2006   joints 2 fingers,rt hand; joint left thumb  . POSTERIOR FUSION LUMBAR SPINE  10/31/2011   L2-3; 3-4  . POSTERIOR FUSION LUMBAR SPINE  11/2009   L3-4;  L4-5  . POSTERIOR LUMBAR FUSION N/A 08/05/13   T1, T2  . REIMPLANTATION OF TOTAL KNEE Left 07/05/2015   Procedure: REIMPLANTATION OF LEFT TOTAL KNEE  WITH REMOVAL OF ANTIBIOTIC SPACER;  Surgeon: Paralee Cancel, MD;  Location: [facility];  Service: Orthopedics;  Laterality: Left;  . SINUS SURGERY WITH INSTATRAK    . SPINE SURGERY    . THYROIDECTOMY, PARTIAL  1997  . TONSILLECTOMY     "when I was a small child"  . TOTAL KNEE ARTHROPLASTY Left 06/16/2014   Procedure: TOTAL KNEE ARTHROPLASTY;  Surgeon: Paralee Cancel, MD;  Location: [facility];  Service: Orthopedics;  Laterality: Left;    No Known Allergies    Medication List       Accurate as of 12/06/15  3:04 PM. Always use your most recent med list.          apixaban 5 MG Tabs tablet Commonly known as:  ELIQUIS Take 1 tablet (5 mg total) by mouth 2 (two) times daily.   balsalazide 750 MG capsule Commonly known as:  COLAZAL TAKE 3 CAPSULES TWICE DAILY   bismuth subsalicylate [id number] suspension Commonly known as:  PEPTO BISMOL Take 15 mLs by mouth 4 (four) times daily -  before meals and at bedtime.   Calcium + D3 600-200 MG-UNIT Tabs Take 1 tablet by mouth daily with breakfast.   cefUROXime 500 MG tablet Commonly known as:  CEFTIN Take 1 tablet (500 mg total) by mouth daily.   celecoxib 200 MG capsule Commonly known as:  CELEBREX TAKE 1 CAPSULE TWICE A DAY   collagenase ointment Commonly known as:  SANTYL Apply 1 application topically daily. For wound   diazepam 5 MG tablet Commonly known as:  VALIUM Take 5 mg by mouth every 12 (twelve) hours as needed for muscle spasms.   docusate sodium 100 MG capsule Commonly known as:  COLACE Take 1 capsule (100 mg total) by mouth 2 (two) times daily as needed for mild constipation or moderate constipation.   estradiol 0.5 MG tablet Commonly known as:  ESTRACE Take 0.5 mg by mouth daily.   ferrous sulfate 325 (65 FE) MG tablet Take 325 mg by mouth 2 (two) times daily with a meal.   folic acid 1 MG tablet Commonly known as:  FOLVITE Take 2 mg by mouth daily.   furosemide 40 MG tablet Commonly known as:  LASIX Take 1  tablet (40 mg total) by mouth daily.   guaiFENesin 600 MG 12 hr tablet Commonly known as:  MUCINEX Take 600 mg by mouth 2 (two) times daily.   latanoprost 0.005 % ophthalmic solution Commonly known as:  XALATAN Place 1 drop into both eyes at bedtime.   levothyroxine 88 MCG tablet Commonly known as:  SYNTHROID, LEVOTHROID TAKE 1 TABLET EVERY DAY BEFORE BREAKFAST   medroxyPROGESTERone 2.5 MG tablet Commonly known  as:  PROVERA Take 1 tablet (2.5 mg total) by mouth at bedtime.   metoprolol tartrate 25 MG tablet Commonly known as:  LOPRESSOR Take 25 mg by mouth 2 (two) times daily.   multivitamin with minerals Tabs tablet Take 1 tablet by mouth daily with lunch.   Oxycodone HCl 10 MG Tabs Take 1 tablet (10 mg total) by mouth 3 (three) times daily as needed (severe pain).   polyethylene glycol packet Commonly known as:  MIRALAX / GLYCOLAX Take 17 g by mouth daily as needed for mild constipation.   Potassium Chloride ER 20 MEQ Tbcr Take 2 tablets by mouth daily.   predniSONE 1 MG tablet Commonly known as:  DELTASONE Take 4 tablets (4 mg total) by mouth daily.   SENNA S PO Take 1 tablet by mouth daily as needed (constipation.).   simethicone 125 MG chewable tablet Commonly known as:  MYLICON Chew 0000000 mg by mouth every 8 (eight) hours as needed.   spironolactone 25 MG tablet Commonly known as:  ALDACTONE TAKE 1 TABLET MONDAY, WEDNESDAY, FRIDAY ONLY       Review of Systems:  Review of Systems  Constitutional: Positive for chills and diaphoresis. Negative for fever.       Wt gain, less malaise  HENT: Negative for congestion and hearing loss.   Eyes: Negative for blurred vision.  Respiratory: Negative for shortness of breath.   Cardiovascular: Negative for chest pain and palpitations.  Gastrointestinal: Negative for abdominal pain.  Genitourinary: Negative for dysuria.  Musculoskeletal: Positive for back pain and joint pain. Negative for falls.  Skin:       Left  venous ulcer  Neurological: Positive for weakness.    Health Maintenance  Topic Date Due  . ZOSTAVAX  01/26/1997  . PNA vac Low Risk Adult (2 of 2 - PPSV23) 03/27/2015  . INFLUENZA VACCINE  10/12/2015  . TETANUS/TDAP  08/15/2024  . DEXA SCAN  Completed    Physical Exam: Vitals:   12/06/15 1442  BP: 130/60  Pulse: 60  Temp: 98.6 F (37 C)  TempSrc: Oral  SpO2: 97%  Weight: 135 lb (61.2 kg)   Body mass index is 21.14 kg/m. Physical Exam  Constitutional: She is oriented to person, place, and time. She appears well-developed and well-nourished.  Cardiovascular: Normal rate, regular rhythm, normal heart sounds and intact distal pulses.   Pulmonary/Chest: Effort normal and breath sounds normal. No respiratory distress.  Abdominal: Soft. Bowel sounds are normal.  Musculoskeletal: Normal range of motion. She exhibits edema.  Bilateral legs with pitting edema bilaterally to about 3 inches below the knees; walks with walker  Neurological: She is alert and oriented to person, place, and time.  Skin: Skin is warm.  Left leg with oval venous ulcer present with mix of granulation and pink tissue in base  Psychiatric: She has a normal mood and affect.    Labs reviewed: Basic Metabolic Panel:  Recent Labs  07/07/15 0457 09/03/15 1048 09/22/15 1630  NA 143 137 137  K 4.9 4.1 5.1  CL 112* 97 104  CO2 23 21 21   GLUCOSE 105* 82 90  BUN 38* 33* 30*  CREATININE 1.24* 0.97 1.07*  CALCIUM 8.3* 9.6 9.0  TSH  --  0.668  --    Liver Function Tests:  Recent Labs  12/14/14 1041 12/25/14 1346 07/07/15 0457  AST 37 41* 22  ALT 13 15 7*  ALKPHOS 118* 114 153*  BILITOT 0.4 0.4 0.4  PROT 6.0 5.9* 5.0*  ALBUMIN 4.0 4.0 2.5*   No results for input(s): LIPASE, AMYLASE in the last 8760 hours. No results for input(s): AMMONIA in the last 8760 hours. CBC:  Recent Labs  03/26/15 0405  04/20/15 1451 06/28/15 1430 07/06/15 0508 07/07/15 0457 09/03/15 1048  WBC 5.4  --  6.1 6.9  10.5 11.6* 9.7  NEUTROABS 3.0  --  4.7  --   --   --  8.0*  HGB 8.7*  < > 10.3* 12.0 10.4* 9.6*  --   HCT 28.4*  < > 33.6* 37.9 32.5* 29.3* 34.4  MCV 95.0  --  93.6 87.5 84.0 85.2 90  PLT 247  --  375 269 204 203 380*  < > = values in this interval not displayed.  Assessment/Plan 1. Venous ulcer of left leg (HCC) -continues with yellow-green drainage and has red/yellow mixed base -is flatter now on edges -agree with wound care recs:  May shower but cover wound with plastic bag, apply calcium alginate with silver to wound bed, cover with dry gauze, then wrap with 3 layer compression system.  Keep dry, elevate if they become tight, avoid prolonged standing -change whole thing weekly  2. Chronic venous insufficiency -needs both legs wrapped regularly and to faithfully take the lasix 40mg --pt is taking diuretic now  3. Temperature intolerance - new issue--hopefully not have fevers at home - TSH  4. Hypothyroidism, unspecified hypothyroidism type - cont current levothyroxine, check tsh - TSH  Labs/tests ordered:   Orders Placed This Encounter  Procedures  . TSH    Next appt:  6 weeks to f/u on legs  Janiylah Hannis L. Kingslee Mairena, D.O. [facility] [address], [place] [address] Cell Phone (Mon-Fri 8am-5pm):  [phone] On Call:  [phone] & follow prompts after 5pm & weekends Office Phone:  [phone] Office Fax:  [phone]

## 2015-12-07 LAB — TSH: TSH: 0.23 mIU/L — ABNORMAL LOW

## 2015-12-09 ENCOUNTER — Other Ambulatory Visit: Payer: Self-pay | Admitting: *Deleted

## 2015-12-09 ENCOUNTER — Other Ambulatory Visit: Payer: Self-pay | Admitting: Internal Medicine

## 2015-12-09 DIAGNOSIS — E039 Hypothyroidism, unspecified: Secondary | ICD-10-CM

## 2015-12-09 MED ORDER — LEVOTHYROXINE SODIUM 75 MCG PO TABS: 75.0000 ug | ORAL_TABLET | Freq: Every day | ORAL | 3 refills | Status: DC

## 2015-12-12 ENCOUNTER — Other Ambulatory Visit: Payer: Self-pay | Admitting: Internal Medicine

## 2015-12-20 ENCOUNTER — Other Ambulatory Visit: Payer: Self-pay | Admitting: Internal Medicine

## 2015-12-23 DIAGNOSIS — I739 Peripheral vascular disease, unspecified: Secondary | ICD-10-CM | POA: Diagnosis not present

## 2015-12-23 DIAGNOSIS — R413 Other amnesia: Secondary | ICD-10-CM | POA: Diagnosis not present

## 2015-12-23 DIAGNOSIS — I10 Essential (primary) hypertension: Secondary | ICD-10-CM | POA: Diagnosis not present

## 2015-12-23 DIAGNOSIS — I83228 Varicose veins of left lower extremity with both ulcer of other part of lower extremity and inflammation: Secondary | ICD-10-CM | POA: Diagnosis not present

## 2015-12-23 DIAGNOSIS — I480 Paroxysmal atrial fibrillation: Secondary | ICD-10-CM | POA: Diagnosis not present

## 2015-12-23 DIAGNOSIS — T8454XD Infection and inflammatory reaction due to internal left knee prosthesis, subsequent encounter: Secondary | ICD-10-CM | POA: Diagnosis not present

## 2015-12-23 DIAGNOSIS — L97822 Non-pressure chronic ulcer of other part of left lower leg with fat layer exposed: Secondary | ICD-10-CM | POA: Diagnosis not present

## 2015-12-27 ENCOUNTER — Other Ambulatory Visit: Payer: Self-pay | Admitting: *Deleted

## 2015-12-27 MED ORDER — BALSALAZIDE DISODIUM 750 MG PO CAPS: ORAL_CAPSULE | ORAL | 3 refills | Status: DC

## 2015-12-27 NOTE — Telephone Encounter (Signed)
Yes

## 2015-12-27 NOTE — Telephone Encounter (Signed)
Patient called and stated that she takes Balsalazide 6 capsules daily and wants #180/month. Only #60 is being prescribed at a time. Is is ok to give #180 every month. Please Advise.

## 2015-12-28 ENCOUNTER — Other Ambulatory Visit: Payer: Self-pay | Admitting: Internal Medicine

## 2016-01-06 ENCOUNTER — Other Ambulatory Visit: Payer: Self-pay

## 2016-01-06 DIAGNOSIS — M05711 Rheumatoid arthritis with rheumatoid factor of right shoulder without organ or systems involvement: Secondary | ICD-10-CM

## 2016-01-06 MED ORDER — POTASSIUM CHLORIDE CRYS ER 20 MEQ PO TBCR: 20.0000 meq | EXTENDED_RELEASE_TABLET | Freq: Every day | ORAL | 5 refills | Status: DC

## 2016-01-06 MED ORDER — METOPROLOL TARTRATE 25 MG PO TABS: 25.0000 mg | ORAL_TABLET | Freq: Two times a day (BID) | ORAL | 5 refills | Status: DC

## 2016-01-06 MED ORDER — FUROSEMIDE 40 MG PO TABS: 40.0000 mg | ORAL_TABLET | Freq: Every day | ORAL | 5 refills | Status: DC

## 2016-01-06 MED ORDER — BALSALAZIDE DISODIUM 750 MG PO CAPS: ORAL_CAPSULE | ORAL | 5 refills | Status: DC

## 2016-01-06 MED ORDER — LEVOTHYROXINE SODIUM 75 MCG PO TABS: 75.0000 ug | ORAL_TABLET | Freq: Every day | ORAL | 3 refills | Status: AC

## 2016-01-06 MED ORDER — APIXABAN 5 MG PO TABS: 5.0000 mg | ORAL_TABLET | Freq: Two times a day (BID) | ORAL | 5 refills | Status: DC

## 2016-01-06 MED ORDER — CELECOXIB 200 MG PO CAPS: 200.0000 mg | ORAL_CAPSULE | Freq: Two times a day (BID) | ORAL | 5 refills | Status: DC

## 2016-01-06 MED ORDER — PREDNISONE 1 MG PO TABS: 4.0000 mg | ORAL_TABLET | Freq: Every day | ORAL | 3 refills | Status: DC

## 2016-01-06 MED ORDER — DOCUSATE SODIUM 100 MG PO CAPS: 100.0000 mg | ORAL_CAPSULE | Freq: Two times a day (BID) | ORAL | 5 refills | Status: DC | PRN

## 2016-01-06 MED ORDER — DIAZEPAM 5 MG PO TABS: 5.0000 mg | ORAL_TABLET | Freq: Two times a day (BID) | ORAL | 0 refills | Status: DC | PRN

## 2016-01-06 MED ORDER — OXYCODONE HCL 10 MG PO TABS: 10.0000 mg | ORAL_TABLET | Freq: Three times a day (TID) | ORAL | 0 refills | Status: DC | PRN

## 2016-01-06 MED ORDER — SPIRONOLACTONE 25 MG PO TABS: ORAL_TABLET | ORAL | 5 refills | Status: DC

## 2016-01-06 MED ORDER — MEDROXYPROGESTERONE ACETATE 2.5 MG PO TABS: 2.5000 mg | ORAL_TABLET | Freq: Every day | ORAL | 5 refills | Status: AC

## 2016-01-06 NOTE — Telephone Encounter (Signed)
Received a fax from [facility] [phone] that requested refills on the following medications.   spironalactone  lospressor 25 mg  predisone 1 mg  eliquis 5 mg  klor-con 20 mg colazal 750 mg  Levothyroxine 75 mcg celebrex 200 mg Provera 2.5 mg Colace 100 mg Lasix 40 mg  These medications were sent electronically.  Valium 5 mg was printed and placed in Dr Cyndi Lennert folder for signing.

## 2016-01-06 NOTE — Telephone Encounter (Signed)
Spoke with patient and advised rx ready for pick-up and it will be at the front desk.  

## 2016-01-06 NOTE — Telephone Encounter (Signed)
Patient called and stated that she needed a refill on her pain medication, oxycodone 10 mg. Last fill date was 11/25/15, so prescription was printed and placed in Dr. Cyndi Lennert folder for signing.

## 2016-01-17 ENCOUNTER — Encounter: Payer: Self-pay | Admitting: Internal Medicine

## 2016-01-17 ENCOUNTER — Ambulatory Visit (INDEPENDENT_AMBULATORY_CARE_PROVIDER_SITE_OTHER): Payer: Medicare Other

## 2016-01-17 ENCOUNTER — Ambulatory Visit (INDEPENDENT_AMBULATORY_CARE_PROVIDER_SITE_OTHER): Payer: Medicare Other | Admitting: Internal Medicine

## 2016-01-17 VITALS — BP 124/60 | HR 61 | Temp 98.9°F | Ht 67.0 in | Wt 142.0 lb

## 2016-01-17 DIAGNOSIS — K5190 Ulcerative colitis, unspecified, without complications: Secondary | ICD-10-CM | POA: Diagnosis not present

## 2016-01-17 DIAGNOSIS — E440 Moderate protein-calorie malnutrition: Secondary | ICD-10-CM

## 2016-01-17 DIAGNOSIS — I872 Venous insufficiency (chronic) (peripheral): Secondary | ICD-10-CM

## 2016-01-17 DIAGNOSIS — M05711 Rheumatoid arthritis with rheumatoid factor of right shoulder without organ or systems involvement: Secondary | ICD-10-CM

## 2016-01-17 DIAGNOSIS — E039 Hypothyroidism, unspecified: Secondary | ICD-10-CM | POA: Diagnosis not present

## 2016-01-17 DIAGNOSIS — L97929 Non-pressure chronic ulcer of unspecified part of left lower leg with unspecified severity: Principal | ICD-10-CM

## 2016-01-17 DIAGNOSIS — T8454XD Infection and inflammatory reaction due to internal left knee prosthesis, subsequent encounter: Secondary | ICD-10-CM

## 2016-01-17 DIAGNOSIS — Z0000 Encounter for general adult medical examination without abnormal findings: Secondary | ICD-10-CM | POA: Diagnosis not present

## 2016-01-17 DIAGNOSIS — I83029 Varicose veins of left lower extremity with ulcer of unspecified site: Secondary | ICD-10-CM | POA: Diagnosis not present

## 2016-01-17 DIAGNOSIS — R6889 Other general symptoms and signs: Secondary | ICD-10-CM | POA: Diagnosis not present

## 2016-01-17 LAB — TSH: TSH: 0.93 mIU/L

## 2016-01-17 NOTE — Progress Notes (Signed)
Subjective:   [name] is a 81 y.o. female who presents for an Initial Medicare Annual Wellness Visit.  Review of Systems     Cardiac Risk Factors include: advanced age (>10men, >36 women);sedentary lifestyle;smoking/ tobacco exposure;hypertension     Objective:    Today's Vitals   01/17/16 1406 01/17/16 1408  BP: 124/60   Pulse: 61   Temp: 98.9 F (37.2 C)   TempSrc: Oral   SpO2: 98%   Weight: 142 lb (64.4 kg)   Height: 5\' 7"  (1.702 m)   PainSc: 8  8   PainLoc: Generalized    Body mass index is 22.24 kg/m.   Current Medications (verified) Outpatient Encounter Prescriptions as of 01/17/2016  Medication Sig  . apixaban (ELIQUIS) 5 MG TABS tablet Take 1 tablet (5 mg total) by mouth 2 (two) times daily.  . balsalazide (COLAZAL) 750 MG capsule TAKE 3 CAPSULES TWICE DAILY  . bismuth subsalicylate (PEPTO BISMOL) 262 MG/15ML suspension Take 15 mLs by mouth 4 (four) times daily -  before meals and at bedtime.  . Calcium Carb-Cholecalciferol (CALCIUM + D3) 600-200 MG-UNIT TABS Take 1 tablet by mouth daily with breakfast.   . cefUROXime (CEFTIN) 500 MG tablet Take 1 tablet (500 mg total) by mouth daily.  . celecoxib (CELEBREX) 200 MG capsule Take 1 capsule (200 mg total) by mouth 2 (two) times daily.  . collagenase (SANTYL) ointment Apply 1 application topically daily. For wound  . diazepam (VALIUM) 5 MG tablet Take 1 tablet (5 mg total) by mouth every 12 (twelve) hours as needed for muscle spasms.  Marland Kitchen estradiol (ESTRACE) 0.5 MG tablet Take 0.5 mg by mouth daily.  . ferrous sulfate 325 (65 FE) MG tablet Take 325 mg by mouth 2 (two) times daily with a meal.  . folic acid (FOLVITE) 1 MG tablet Take 2 mg by mouth daily.  . furosemide (LASIX) 40 MG tablet Take 1 tablet (40 mg total) by mouth daily.  Marland Kitchen latanoprost (XALATAN) 0.005 % ophthalmic solution Place 1 drop into both eyes at bedtime.  Marland Kitchen levothyroxine (SYNTHROID, LEVOTHROID) 75 MCG tablet Take 1 tablet (75 mcg total) by  mouth daily.  . medroxyPROGESTERone (PROVERA) 2.5 MG tablet Take 1 tablet (2.5 mg total) by mouth at bedtime.  . metoprolol tartrate (LOPRESSOR) 25 MG tablet Take 1 tablet (25 mg total) by mouth 2 (two) times daily.  . Multiple Vitamin (MULTIVITAMIN WITH MINERALS) TABS Take 1 tablet by mouth daily with lunch.   . Oxycodone HCl 10 MG TABS Take 1 tablet (10 mg total) by mouth 3 (three) times daily as needed (severe pain).  . polyethylene glycol (MIRALAX / GLYCOLAX) packet Take 17 g by mouth daily as needed for mild constipation.   . potassium chloride SA (KLOR-CON M20) 20 MEQ tablet Take 1 tablet (20 mEq total) by mouth daily.  . predniSONE (DELTASONE) 1 MG tablet Take 4 tablets (4 mg total) by mouth daily.  Orlie Dakin Sodium (SENNA S PO) Take 1 tablet by mouth daily as needed (constipation.).  Marland Kitchen simethicone (MYLICON) 0000000 MG chewable tablet Chew 125 mg by mouth every 8 (eight) hours as needed.  Marland Kitchen spironolactone (ALDACTONE) 25 MG tablet TAKE 1 TABLET MONDAY, WEDNESDAY, FRIDAY ONLY  . [DISCONTINUED] docusate sodium (COLACE) 100 MG capsule Take 1 capsule (100 mg total) by mouth 2 (two) times daily as needed for mild constipation or moderate constipation.  . [DISCONTINUED] levothyroxine (SYNTHROID, LEVOTHROID) 88 MCG tablet    No facility-administered encounter medications on file as  of 01/17/2016.     Allergies (verified) Patient has no known allergies.   History: Past Medical History:  Diagnosis Date  . Anemia   . Anxiety    sometimes   . Bruise    right lower extremity with opsite on  . Chronic back pain    "my entire spine"  . Chronic bronchitis ([place]) 10/31/2011   "prone to it; I have sinus/bronchitis problem probably q yr"  . Complication of anesthesia 2009   htn with block- for hand surgery , pt thinks anesthesia causing some long term confusion  . Depression   . Diarrhea 01/27/2015  . Dizziness   . Dysrhythmia    pt states bring off coumadin for paroxysmal atrial  fibrillation for last month  . Edema   . Encounter for long-term (current) use of other medications   . Falls   . H/O clavicle fracture    left  . Hard of hearing   . History of blood transfusion   . Hypertension    pcp   dr Maleiah Dula   [facility]  . Hypothyroidism   . Hypothyroidism   . Multiple skin tears 10/31/2011   "get them very easily; my skin is thin and I bruise easily"  . Other and unspecified hyperlipidemia   . Peripheral vascular disease ([place])   . Pneumonia 2008  . Rheumatoid arthritis(714.0)    degenerative disc disease , ra  . Senile osteoporosis   . Spinal stenosis, unspecified region other than cervical   . Superficial thrombophlebitis 04/20/2015  . Swelling of right upper extremity 11/26/2014  . Ulcerative colitis ([place])   . Unspecified glaucoma(365.9)    Past Surgical History:  Procedure Laterality Date  . ANTERIOR CERVICAL DECOMP/DISCECTOMY FUSION N/A 02/17/2014   Procedure: CERVICAL FOUR TO FIVE ANTERIOR CERVICAL DECOMPRESSION/DISCECTOMY FUSION 1 LEVEL;  Surgeon: [name], MD;  Location: [facility];  Service: Neurosurgery;  Laterality: N/A;  C4-5 Anterior cervical decompression/diskectomy/fusion  . BACK SURGERY    . CATARACT EXTRACTION Left 11/11/2013  . CATARACT EXTRACTION Right 12/30/2013  . EXCISIONAL TOTAL KNEE ARTHROPLASTY WITH ANTIBIOTIC SPACERS Left 03/01/2015   Procedure: RESECTION LEFT TOTAL KNEE ARTHROPLASTY WITH PLACEMENT OF  ANTIBIOTIC SPACERS;  Surgeon: Paralee Cancel, MD;  Location: [facility];  Service: Orthopedics;  Laterality: Left;  . EYE SURGERY Bilateral   . FUNCTIONAL ENDOSCOPIC SINUS SURGERY  1988  . I&D KNEE WITH POLY EXCHANGE Left 10/13/2014   Procedure: INCISION AND DRAINAGE LEFT TOTAL KNEE WITH POLY EXCHANGE;  Surgeon: Paralee Cancel, MD;  Location: [facility];  Service: Orthopedics;  Laterality: Left;  . JOINT REPLACEMENT  2009; 2006   joints 2 fingers,rt hand; joint left thumb  . POSTERIOR FUSION LUMBAR SPINE  10/31/2011   L2-3; 3-4  .  POSTERIOR FUSION LUMBAR SPINE  11/2009   L3-4;  L4-5  . POSTERIOR LUMBAR FUSION N/A 08/05/13   T1, T2  . REIMPLANTATION OF TOTAL KNEE Left 07/05/2015   Procedure: REIMPLANTATION OF LEFT TOTAL KNEE WITH REMOVAL OF ANTIBIOTIC SPACER;  Surgeon: Paralee Cancel, MD;  Location: [facility];  Service: Orthopedics;  Laterality: Left;  . SINUS SURGERY WITH INSTATRAK    . SPINE SURGERY    . THYROIDECTOMY, PARTIAL  1997  . TONSILLECTOMY     "when I was a small child"  . TOTAL KNEE ARTHROPLASTY Left 06/16/2014   Procedure: TOTAL KNEE ARTHROPLASTY;  Surgeon: Paralee Cancel, MD;  Location: [facility];  Service: Orthopedics;  Laterality: Left;   Family History  Problem Relation Age  of Onset  . Heart disease Mother   . Cancer Father     lung   Social History   Occupational History  . Not on file.   Social History Main Topics  . Smoking status: Current Every Day Smoker    Packs/day: 0.25    Years: 50.00    Types: Cigarettes    Last attempt to quit: 02/20/2015  . Smokeless tobacco: Never Used     Comment: one a day, i dont inhale per pt  . Alcohol use 0.6 oz/week    1 Cans of beer per week  . Drug use: No  . Sexual activity: Not Currently    Tobacco Counseling Ready to quit: Not Answered Counseling given: No   Activities of Daily Living In your present state of health, do you have any difficulty performing the following activities: 01/17/2016 11/23/2015  Hearing? Y N  Vision? Y N  Difficulty concentrating or making decisions? Y N  Walking or climbing stairs? Y Y  Dressing or bathing? Y Y  Doing errands, shopping? Tempie Donning  Preparing Food and eating ? N -  Using the Toilet? Y -  In the past six months, have you accidently leaked urine? N -  Do you have problems with loss of bowel control? N -  Managing your Medications? N -  Managing your Finances? Y -  Housekeeping or managing your Housekeeping? Y -  Some recent data might be hidden    Immunizations and Health Maintenance Immunization History    Administered Date(s) Administered  . Influenza Whole 11/28/2012  . Influenza, Quadrivalent, Recombinant, Inj, Pf 10/28/2015  . Influenza-Unspecified 12/03/2014  . Pneumococcal Conjugate-13 03/26/2014  . Pneumococcal Polysaccharide-23 11/12/1998  . Td 03/15/2009  . Tdap 08/16/2014   There are no preventive care reminders to display for this patient.  Patient Care Team: Gayland Curry, DO as PCP - General (Geriatric Medicine) Hennie Duos, MD as Consulting Physician (Rheumatology) Paralee Cancel, MD as Consulting Physician (Orthopedic Surgery) Gean Birchwood, DPM as Consulting Physician (Podiatry) [name], MD as Consulting Physician (Neurosurgery) Leanna Battles, MD as Consulting Physician (Internal Medicine) Truman Hayward, MD as Consulting Physician (Infectious Diseases)  Indicate any recent Medical Services you may have received from other than [facility] providers in the past year (date may be approximate).     Assessment:   This is a routine wellness examination for [name].  Hearing/Vision screen Hearing Screening Comments: Pt has bilateral hearing aids. States she is due to having them checked.  Vision Screening Comments: Last eye exam done 11/2015. Hx of cataracts.   Dietary issues and exercise activities discussed: Current Exercise Habits: The patient does not participate in regular exercise at present, Exercise limited by: neurologic condition(s)  Goals    . Increase water intake          Starting 01/17/2016, I will attempt to drink 6 glasses of water per day.       Depression Screen PHQ 2/9 Scores 01/17/2016 12/06/2015 11/23/2015 10/21/2015 10/18/2015 09/22/2015 06/21/2015  PHQ - 2 Score 0 0 2 0 0 0 0  PHQ- 9 Score - - 9 - - - -    Fall Risk Fall Risk  01/17/2016 12/06/2015 11/25/2015 11/23/2015 10/18/2015  Falls in the past year? Yes No No Yes Yes  Number falls in past yr: 2 or more - - 2 or more 2 or more  Injury with Fall? Yes - - Yes Yes  Risk Factor Category   High Fall  Risk - - High Fall Risk -  Risk for fall due to : History of fall(s);Impaired balance/gait;Impaired mobility;Medication side effect - - History of fall(s);Impaired balance/gait -  Follow up Falls prevention discussed - - Falls evaluation completed;Education provided -    Cognitive Function: MMSE - Mini Mental State Exam 01/17/2016  Orientation to time 3  Orientation to Place 4  Registration 3  Attention/ Calculation 5  Recall 2  Language- name 2 objects 2  Language- repeat 1  Language- follow 3 step command 3  Language- read & follow direction 1  Write a sentence 1  Copy design 1  Total score 26        Screening Tests Health Maintenance  Topic Date Due  . ZOSTAVAX  03/09/2017 (Originally 01/26/1997)  . PNA vac Low Risk Adult (2 of 2 - PPSV23) 03/09/2017 (Originally 03/27/2015)  . TETANUS/TDAP  08/15/2024  . INFLUENZA VACCINE  Completed  . DEXA SCAN  Completed      Plan:    I have personally reviewed and addressed the Medicare Annual Wellness questionnaire and have noted the following in the patient's chart:  A. Medical and social history B. Use of alcohol, tobacco or illicit drugs  C. Current medications and supplements D. Functional ability and status E.  Nutritional status F.  Physical activity G. Advance directives H. List of other physicians I.  Hospitalizations, surgeries, and ER visits in previous 12 months J.  [place] to include hearing, vision, cognitive, depression L. Referrals and appointments - none  In addition, I have reviewed and discussed with patient certain preventive protocols, quality metrics, and best practice recommendations. A written personalized care plan for preventive services as well as general preventive health recommendations were provided to patient.  See attached scanned questionnaire for additional information.   Signed,   Allyn Kenner, LPN Health Advisor  I reviewed health advisor's note, was available  for consultation and agree with the assessment and plan as written.    Lavita Pontius L. Lesly Joslyn, D.O. [facility] [address], [place] [address] Cell Phone (Mon-Fri 8am-5pm):  [phone] On Call:  [phone] & follow prompts after 5pm & weekends Office Phone:  [phone] Office Fax:  [phone]

## 2016-01-17 NOTE — Patient Instructions (Signed)
[name] , Thank you for taking time to come for your Medicare Wellness Visit. I appreciate your ongoing commitment to your health goals. Please review the following plan we discussed and let me know if I can assist you in the future.   These are the goals we discussed: Goals    None      This is a list of the screening recommended for you and due dates:  Health Maintenance  Topic Date Due  . Shingles Vaccine  01/26/1997  . Pneumonia vaccines (2 of 2 - PPSV23) 03/27/2015  . Tetanus Vaccine  08/15/2024  . Flu Shot  Completed  . DEXA scan (bone density measurement)  Completed  Preventive Care for Adults  A healthy lifestyle and preventive care can promote health and wellness. Preventive health guidelines for adults include the following key practices.  . A routine yearly physical is a good way to check with your health care provider about your health and preventive screening. It is a chance to share any concerns and updates on your health and to receive a thorough exam.  . Visit your dentist for a routine exam and preventive care every 6 months. Brush your teeth twice a day and floss once a day. Good oral hygiene prevents tooth decay and gum disease.  . The frequency of eye exams is based on your age, health, family medical history, use  of contact lenses, and other factors. Follow your health care provider's ecommendations for frequency of eye exams.  . Eat a healthy diet. Foods like vegetables, fruits, whole grains, low-fat dairy products, and lean protein foods contain the nutrients you need without too many calories. Decrease your intake of foods high in solid fats, added sugars, and salt. Eat the right amount of calories for you. Get information about a proper diet from your health care provider, if necessary.  . Regular physical exercise is one of the most important things you can do for your health. Most adults should get at least 150 minutes of moderate-intensity exercise (any  activity that increases your heart rate and causes you to sweat) each week. In addition, most adults need muscle-strengthening exercises on 2 or more days a week.  Silver Sneakers may be a benefit available to you. To determine eligibility, you may visit the website: www.silversneakers.com or contact program at [phone] Mon-Fri between 8AM-8PM.   . Maintain a healthy weight. The body mass index (BMI) is a screening tool to identify possible weight problems. It provides an estimate of body fat based on height and weight. Your health care provider can find your BMI and can help you achieve or maintain a healthy weight.   For adults 20 years and older: ? A BMI below 18.5 is considered underweight. ? A BMI of 18.5 to 24.9 is normal. ? A BMI of 25 to 29.9 is considered overweight. ? A BMI of 30 and above is considered obese.   . Maintain normal blood lipids and cholesterol levels by exercising and minimizing your intake of saturated fat. Eat a balanced diet with plenty of fruit and vegetables. Blood tests for lipids and cholesterol should begin at age 37 and be repeated every 5 years. If your lipid or cholesterol levels are high, you are over 50, or you are at high risk for heart disease, you may need your cholesterol levels checked more frequently. Ongoing high lipid and cholesterol levels should be treated with medicines if diet and exercise are not working.  . If you smoke, find  out from your health care provider how to quit. If you do not use tobacco, please do not start.  . If you choose to drink alcohol, please do not consume more than 2 drinks per day. One drink is considered to be 12 ounces (355 mL) of beer, 5 ounces (148 mL) of wine, or 1.5 ounces (44 mL) of liquor.  . If you are 93-62 years old, ask your health care provider if you should take aspirin to prevent strokes.  . Use sunscreen. Apply sunscreen liberally and repeatedly throughout the day. You should seek shade when your  shadow is shorter than you. Protect yourself by wearing long sleeves, pants, a wide-brimmed hat, and sunglasses year round, whenever you are outdoors.  . Once a month, do a whole body skin exam, using a mirror to look at the skin on your back. Tell your health care provider of new moles, moles that have irregular borders, moles that are larger than a pencil eraser, or moles that have changed in shape or color.

## 2016-01-17 NOTE — Patient Instructions (Signed)
Please call [facility] for an appointment to get your toenails cut.  We will have to have your compression wraps off for the day of the appointment.

## 2016-01-17 NOTE — Progress Notes (Signed)
Location:  [facility] clinic Provider:  Zadin Lange L. Mariea Clonts, D.O., C.M.D.  Code Status: DNR Goals of Care:  Advanced Directives 01/17/2016  Does patient have an advance directive? Yes  Type of Advance Directive Out of facility DNR (pink MOST or yellow form)  Copy of advanced directive(s) in chart? Yes  Would patient like information on creating an advanced directive? -  Pre-existing out of facility DNR order (yellow form or pink MOST form) Yellow form placed in chart (order not valid for inpatient use)   Chief Complaint  Patient presents with  . Medical Management of Chronic Issues    6 weeks follow-up on ulcer    HPI: Patient is a 81 y.o. female seen today for medical management of chronic diseases.  She is here to f/u on the appearance of her venous ulcer.  She also is unsure about her medications as occurs each visit.  She is now not understanding if she should take ceftin ongoing.  Reviewed Dr. Henreitta Leber note which clearly indicates the ceftin is now for chronic suppression b/c she could not tolerate more potent abx.    She continues to ambulate slowly with her walker.  She has gained 7 lbs since her last appt and had gained 7 lbs the previous appt so she is making progress with drinking boost.    Compression wraps have helped tremendously with edema and venous insufficiency.  The ulcer does look better in terms of pink tissue in the wound.  It remains large.    She was having temperature intolerance so I checked her TSH which was actually low suggesting too much synthroid.  We lowered her dose and she is now on 54mcg daily and needs a recheck today.  She continues to have sweats.    BP is well controlled--no need for changes.  Her toenails desperately need to be trimmed and are thick and long.    She is now using a new mail order delivery where the pills come together in little bags.    RA pain in her hands, upper back, down arms is awful.  Humira helped her UC but not her RA.  UC now  remains, but not as fierce as it has been sometimes.    Annual wellness visit was reviewed and she scored 26/30 and took a long time on the clock drawing.    Past Medical History:  Diagnosis Date  . Anemia   . Anxiety    sometimes   . Bruise    right lower extremity with opsite on  . Chronic back pain    "my entire spine"  . Chronic bronchitis ([place]) 10/31/2011   "prone to it; I have sinus/bronchitis problem probably q yr"  . Complication of anesthesia 2009   htn with block- for hand surgery , pt thinks anesthesia causing some long term confusion  . Depression   . Diarrhea 01/27/2015  . Dizziness   . Dysrhythmia    pt states bring off coumadin for paroxysmal atrial fibrillation for last month  . Edema   . Encounter for long-term (current) use of other medications   . Falls   . H/O clavicle fracture    left  . Hard of hearing   . History of blood transfusion   . Hypertension    pcp   dr Breion Novacek   [facility]  . Hypothyroidism   . Hypothyroidism   . Multiple skin tears 10/31/2011   "get them very easily; my skin is thin and I  bruise easily"  . Other and unspecified hyperlipidemia   . Peripheral vascular disease ([place])   . Pneumonia 2008  . Rheumatoid arthritis(714.0)    degenerative disc disease , ra  . Senile osteoporosis   . Spinal stenosis, unspecified region other than cervical   . Superficial thrombophlebitis 04/20/2015  . Swelling of right upper extremity 11/26/2014  . Ulcerative colitis ([place])   . Unspecified glaucoma(365.9)     Past Surgical History:  Procedure Laterality Date  . ANTERIOR CERVICAL DECOMP/DISCECTOMY FUSION N/A 02/17/2014   Procedure: CERVICAL FOUR TO FIVE ANTERIOR CERVICAL DECOMPRESSION/DISCECTOMY FUSION 1 LEVEL;  Surgeon: [name], MD;  Location: [facility];  Service: Neurosurgery;  Laterality: N/A;  C4-5 Anterior cervical decompression/diskectomy/fusion  . BACK SURGERY    . CATARACT EXTRACTION Left 11/11/2013  . CATARACT EXTRACTION Right  12/30/2013  . EXCISIONAL TOTAL KNEE ARTHROPLASTY WITH ANTIBIOTIC SPACERS Left 03/01/2015   Procedure: RESECTION LEFT TOTAL KNEE ARTHROPLASTY WITH PLACEMENT OF  ANTIBIOTIC SPACERS;  Surgeon: Paralee Cancel, MD;  Location: [facility];  Service: Orthopedics;  Laterality: Left;  . EYE SURGERY Bilateral   . FUNCTIONAL ENDOSCOPIC SINUS SURGERY  1988  . I&D KNEE WITH POLY EXCHANGE Left 10/13/2014   Procedure: INCISION AND DRAINAGE LEFT TOTAL KNEE WITH POLY EXCHANGE;  Surgeon: Paralee Cancel, MD;  Location: [facility];  Service: Orthopedics;  Laterality: Left;  . JOINT REPLACEMENT  2009; 2006   joints 2 fingers,rt hand; joint left thumb  . POSTERIOR FUSION LUMBAR SPINE  10/31/2011   L2-3; 3-4  . POSTERIOR FUSION LUMBAR SPINE  11/2009   L3-4;  L4-5  . POSTERIOR LUMBAR FUSION N/A 08/05/13   T1, T2  . REIMPLANTATION OF TOTAL KNEE Left 07/05/2015   Procedure: REIMPLANTATION OF LEFT TOTAL KNEE WITH REMOVAL OF ANTIBIOTIC SPACER;  Surgeon: Paralee Cancel, MD;  Location: [facility];  Service: Orthopedics;  Laterality: Left;  . SINUS SURGERY WITH INSTATRAK    . SPINE SURGERY    . THYROIDECTOMY, PARTIAL  1997  . TONSILLECTOMY     "when I was a small child"  . TOTAL KNEE ARTHROPLASTY Left 06/16/2014   Procedure: TOTAL KNEE ARTHROPLASTY;  Surgeon: Paralee Cancel, MD;  Location: [facility];  Service: Orthopedics;  Laterality: Left;    No Known Allergies    Medication List       Accurate as of 01/17/16  3:03 PM. Always use your most recent med list.          apixaban 5 MG Tabs tablet Commonly known as:  ELIQUIS Take 1 tablet (5 mg total) by mouth 2 (two) times daily.   balsalazide 750 MG capsule Commonly known as:  COLAZAL TAKE 3 CAPSULES TWICE DAILY   bismuth subsalicylate [id number] suspension Commonly known as:  PEPTO BISMOL Take 15 mLs by mouth 4 (four) times daily -  before meals and at bedtime.   Calcium + D3 600-200 MG-UNIT Tabs Take 1 tablet by mouth daily with breakfast.   cefUROXime 500 MG tablet Commonly  known as:  CEFTIN Take 1 tablet (500 mg total) by mouth daily.   celecoxib 200 MG capsule Commonly known as:  CELEBREX Take 1 capsule (200 mg total) by mouth 2 (two) times daily.   collagenase ointment Commonly known as:  SANTYL Apply 1 application topically daily. For wound   diazepam 5 MG tablet Commonly known as:  VALIUM Take 1 tablet (5 mg total) by mouth every 12 (twelve) hours as needed for muscle spasms.   estradiol 0.5 MG tablet Commonly known  as:  ESTRACE Take 0.5 mg by mouth daily.   ferrous sulfate 325 (65 FE) MG tablet Take 325 mg by mouth 2 (two) times daily with a meal.   folic acid 1 MG tablet Commonly known as:  FOLVITE Take 2 mg by mouth daily.   furosemide 40 MG tablet Commonly known as:  LASIX Take 1 tablet (40 mg total) by mouth daily.   latanoprost 0.005 % ophthalmic solution Commonly known as:  XALATAN Place 1 drop into both eyes at bedtime.   levothyroxine 75 MCG tablet Commonly known as:  SYNTHROID, LEVOTHROID Take 1 tablet (75 mcg total) by mouth daily.   medroxyPROGESTERone 2.5 MG tablet Commonly known as:  PROVERA Take 1 tablet (2.5 mg total) by mouth at bedtime.   metoprolol tartrate 25 MG tablet Commonly known as:  LOPRESSOR Take 1 tablet (25 mg total) by mouth 2 (two) times daily.   multivitamin with minerals Tabs tablet Take 1 tablet by mouth daily with lunch.   Oxycodone HCl 10 MG Tabs Take 1 tablet (10 mg total) by mouth 3 (three) times daily as needed (severe pain).   polyethylene glycol packet Commonly known as:  MIRALAX / GLYCOLAX Take 17 g by mouth daily as needed for mild constipation.   potassium chloride SA 20 MEQ tablet Commonly known as:  KLOR-CON M20 Take 1 tablet (20 mEq total) by mouth daily.   predniSONE 1 MG tablet Commonly known as:  DELTASONE Take 4 tablets (4 mg total) by mouth daily.   SENNA S PO Take 1 tablet by mouth daily as needed (constipation.).   simethicone 125 MG chewable tablet Commonly  known as:  MYLICON Chew 0000000 mg by mouth every 8 (eight) hours as needed.   spironolactone 25 MG tablet Commonly known as:  ALDACTONE TAKE 1 TABLET MONDAY, WEDNESDAY, FRIDAY ONLY       Review of Systems:  Review of Systems  Constitutional: Positive for malaise/fatigue. Negative for chills, fever and weight loss.  HENT: Positive for hearing loss.   Respiratory: Negative for cough and shortness of breath.   Cardiovascular: Positive for leg swelling. Negative for chest pain and palpitations.       Edema much better with compression wrap use  Gastrointestinal: Positive for diarrhea. Negative for abdominal pain, blood in stool, constipation and melena.  Genitourinary: Negative for dysuria.  Musculoskeletal: Positive for back pain, joint pain and neck pain. Negative for falls.  Skin: Negative for itching and rash.       Venous ulcer left lateral leg  Neurological: Positive for weakness. Negative for dizziness and loss of consciousness.  Psychiatric/Behavioral: Positive for memory loss. Negative for depression.    Health Maintenance  Topic Date Due  . ZOSTAVAX  03/09/2017 (Originally 01/26/1997)  . PNA vac Low Risk Adult (2 of 2 - PPSV23) 03/09/2017 (Originally 03/27/2015)  . TETANUS/TDAP  08/15/2024  . INFLUENZA VACCINE  Completed  . DEXA SCAN  Completed    Physical Exam: Vitals:   01/17/16 1357  BP: 124/60  Pulse: 61  Temp: 98.9 F (37.2 C)  TempSrc: Oral  SpO2: 98%  Weight: 142 lb (64.4 kg)  Height: 5\' 7"  (1.702 m)   Body mass index is 22.24 kg/m. Physical Exam  Constitutional: She is oriented to person, place, and time. She appears well-developed and well-nourished. No distress.  Cardiovascular: Normal rate, regular rhythm, normal heart sounds and intact distal pulses.   Pulmonary/Chest: Effort normal and breath sounds normal. No respiratory distress.  Abdominal: Soft. Bowel sounds are  normal.  Neurological: She is alert and oriented to person, place, and time.    Skin: Skin is warm.  Dry scaly skin on legs and arms; left leg with 1.5 in x 1 in wide venous ulcer, majority of base is pink-red, some thin yellow drainage, no surrounding erythema or warmth   Psychiatric: She has a normal mood and affect.    Labs reviewed: Basic Metabolic Panel:  Recent Labs  07/07/15 0457 09/03/15 1048 09/22/15 1630 12/06/15 1545  NA 143 137 137  --   K 4.9 4.1 5.1  --   CL 112* 97 104  --   CO2 23 21 21   --   GLUCOSE 105* 82 90  --   BUN 38* 33* 30*  --   CREATININE 1.24* 0.97 1.07*  --   CALCIUM 8.3* 9.6 9.0  --   TSH  --  0.668  --  0.23*   Liver Function Tests:  Recent Labs  07/07/15 0457  AST 22  ALT 7*  ALKPHOS 153*  BILITOT 0.4  PROT 5.0*  ALBUMIN 2.5*   No results for input(s): LIPASE, AMYLASE in the last 8760 hours. No results for input(s): AMMONIA in the last 8760 hours. CBC:  Recent Labs  03/26/15 0405  04/20/15 1451 06/28/15 1430 07/06/15 0508 07/07/15 0457 09/03/15 1048  WBC 5.4  --  6.1 6.9 10.5 11.6* 9.7  NEUTROABS 3.0  --  4.7  --   --   --  8.0*  HGB 8.7*  < > 10.3* 12.0 10.4* 9.6*  --   HCT 28.4*  < > 33.6* 37.9 32.5* 29.3* 34.4  MCV 95.0  --  93.6 87.5 84.0 85.2 90  PLT 247  --  375 269 204 203 380*  < > = values in this interval not displayed.  Assessment/Plan 1. Venous ulcer of left leg (HCC) -appears she does have more pink tissue but size appears larger to me -I'm going to ask a wound care expert about it and will contact [facility] if we need to change the current orders  2. Chronic venous insufficiency -will cont compression wraps except on podiatry nail-trimming day  3. Malnutrition of moderate degree -cont boost supplements--case provided today  4. Temperature intolerance -due for f/u tsh today  5. Hypothyroidism, unspecified type -cont current levothyroxine pending tsh  6. 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 Rheumatoid arthritis involving right shoulder with positive rheumatoid factor (HCC) -cont her regular therapy--discussed that likely with her infection of her knee, there are no new options to help her pain mgt so seeing another rheumatologist is unlikely to benefit her at this time  8. Ulcerative colitis, chronic, without complications ([place]) -cont current therapy, cannot take humira now with her knee infection so has some more loose stool, but it is better than last visit since she is eating better  Labs/tests ordered: TSH today Next appt:  02/07/2016    Krystan Northrop L. Shambhavi Salley, D.O. [facility] [address], [place] [address] Cell Phone (Mon-Fri 8am-5pm):  [phone] On Call:  [phone] & follow prompts after 5pm & weekends Office Phone:  [phone] Office Fax:  [phone]

## 2016-01-19 ENCOUNTER — Telehealth: Payer: Self-pay | Admitting: *Deleted

## 2016-01-19 NOTE — Telephone Encounter (Signed)
-----   Message from Gayland Curry, DO sent at 01/18/2016  9:42 AM EST ----- Please call [facility] nurse who is seeing [name].  Due to the wound bed looking much better (mostly red beefy tissue now), I would like for them to change the dressing orders:  Moisturize both legs well with a cream of [facility]' choice (otc eucerin or lubriderm fine).  Cleanse her left leg around wound with nonsting skin prep (to prevent wound from growing).  Then apply a hydrocolloid dressing (duoderm or similar product) over the wound itself to absorb some moisture.  Continue to wrap the legs with unnaboots (no change to that portion of the instructions).    This can still be changed weekly (pt reports they've been coming on Tuesdays).  Please reassess in one week.  If a picture can be sent to me of the wound, that would be helpful--[phone].  Should she make her podiatry appt, she will need to have dressings off so she can get her toenails trimmed.

## 2016-01-19 NOTE — Telephone Encounter (Signed)
Janett Billow with [facility] called and stated that patient's wound is not improving. Nurse thinks patient needs to go back to the Wound Clinic. Patient has edema and now has 2 Large Water Blisters just below the ankle right below where the wraps were placed on in office on Monday, Stated it looked like a Tourniquet around her ankle. Nurse thinks she needs to go to Wound Care before it gets any worse. She stated patient did agree to go to the Wound Care.  Please Advise.

## 2016-01-19 NOTE — Telephone Encounter (Signed)
Left message with receptionist to ask Janett Billow to return my call.

## 2016-01-20 ENCOUNTER — Encounter: Payer: Self-pay | Admitting: *Deleted

## 2016-01-20 NOTE — Telephone Encounter (Signed)
Spoke with [name] today and advised results ( see phone note 01/19/16

## 2016-01-20 NOTE — Telephone Encounter (Signed)
Spoke with [name] and advised instructions from Dr.[name], she will send picture to dr. Mariea Clonts, per nurse wound looks yellow and fluffy now.

## 2016-01-21 ENCOUNTER — Other Ambulatory Visit: Payer: Self-pay | Admitting: *Deleted

## 2016-01-24 ENCOUNTER — Other Ambulatory Visit: Payer: Self-pay | Admitting: Internal Medicine

## 2016-01-24 NOTE — Telephone Encounter (Signed)
Please advise if dispense number can be for 60 vs 30 due to current instructions

## 2016-01-26 ENCOUNTER — Other Ambulatory Visit: Payer: Self-pay | Admitting: *Deleted

## 2016-01-26 MED ORDER — DIAZEPAM 5 MG PO TABS: 5.0000 mg | ORAL_TABLET | Freq: Two times a day (BID) | ORAL | 0 refills | Status: DC | PRN

## 2016-01-26 NOTE — Telephone Encounter (Signed)
[facility] 

## 2016-01-28 ENCOUNTER — Telehealth: Payer: Self-pay

## 2016-01-28 NOTE — Telephone Encounter (Signed)
Pt needs to be seen - recommend urgent care

## 2016-01-28 NOTE — Telephone Encounter (Signed)
Tracey from [facility] called to follow-up on a message that she left on Wednesday. (no message in Epic)  Patient was seen by [facility] on Wednesday: Patient with increased swelling in lower extremity and drainage in thigh area.  No available appointment's today  Please advise

## 2016-01-30 NOTE — Telephone Encounter (Signed)
I don't see the message from 2 days ago anywhere.  Hopefully she went to urgent care.  I haven't gotten any notes.

## 2016-01-31 NOTE — Telephone Encounter (Signed)
Left message on voicemail for patient to return call when available    I called Linus Orn with [facility] to confirm next visit with patient, patient will be seen on Wednesday. Linus Orn has tried to contact patient by phone with no return call.  I will continue to try and reach patient.

## 2016-01-31 NOTE — Telephone Encounter (Signed)
Spoke with patient, patient states the drainage from her legs has resolved completely. Patient is still taking lasix as prescribed, legs are always swollen.   Patient will keep pending appointment for 02/07/16, unless drainage returns or swelling increases.

## 2016-01-31 NOTE — Telephone Encounter (Signed)
Left message on voicemail for patient to return call when available.   Patient needs to schedule an appointment today or go to Urgent Care

## 2016-02-07 ENCOUNTER — Ambulatory Visit (INDEPENDENT_AMBULATORY_CARE_PROVIDER_SITE_OTHER): Payer: Medicare Other | Admitting: Internal Medicine

## 2016-02-07 ENCOUNTER — Encounter: Payer: Self-pay | Admitting: Internal Medicine

## 2016-02-07 VITALS — BP 130/68 | HR 60 | Temp 98.5°F | Wt 137.0 lb

## 2016-02-07 DIAGNOSIS — D429 Neoplasm of uncertain behavior of meninges, unspecified: Secondary | ICD-10-CM

## 2016-02-07 DIAGNOSIS — L97929 Non-pressure chronic ulcer of unspecified part of left lower leg with unspecified severity: Principal | ICD-10-CM

## 2016-02-07 DIAGNOSIS — I83029 Varicose veins of left lower extremity with ulcer of unspecified site: Secondary | ICD-10-CM

## 2016-02-07 DIAGNOSIS — M05711 Rheumatoid arthritis with rheumatoid factor of right shoulder without organ or systems involvement: Secondary | ICD-10-CM | POA: Diagnosis not present

## 2016-02-07 DIAGNOSIS — K5190 Ulcerative colitis, unspecified, without complications: Secondary | ICD-10-CM | POA: Diagnosis not present

## 2016-02-07 DIAGNOSIS — I480 Paroxysmal atrial fibrillation: Secondary | ICD-10-CM

## 2016-02-07 DIAGNOSIS — I872 Venous insufficiency (chronic) (peripheral): Secondary | ICD-10-CM | POA: Diagnosis not present

## 2016-02-07 MED ORDER — OXYCODONE HCL 10 MG PO TABS: 10.0000 mg | ORAL_TABLET | Freq: Four times a day (QID) | ORAL | 0 refills | Status: DC | PRN

## 2016-02-07 MED ORDER — CEFUROXIME AXETIL 500 MG PO TABS: 500.0000 mg | ORAL_TABLET | Freq: Every day | ORAL | 11 refills | Status: DC

## 2016-02-07 NOTE — Progress Notes (Signed)
Location:  [facility] clinic Provider:  Timisha Mondry L. Mariea Clonts, D.O., C.M.D.  Code Status: DNR Goals of Care:  Advanced Directives 02/07/2016  Does Patient Have a Medical Advance Directive? Yes  Type of Advance Directive Out of facility DNR (pink MOST or yellow form)  Copy of [place] in Chart? -  Would patient like information on creating a medical advance directive? -  Pre-existing out of facility DNR order (yellow form or pink MOST form) Yellow form placed in chart (order not valid for inpatient use)     Chief Complaint  Patient presents with  . Medical Management of Chronic Issues    6 week follow-up    HPI: Patient is a 81 y.o. female seen today for medical management of chronic diseases.    Getting headaches now.  Did not have them before.  Since the meningiomas were noted, but not immediately afterwards.  Had never had headaches before.  Not devastating or debilitating.  Happens on occasion.  Did fall and hit the back of her head.  It happened more than a month ago.  She could not get up and EMS had to come help her get up.    Reports that her oxycodone is not helping as well anymore.  Only time she is w/o pain is when she is sleeping.  Hands and upper body ache and she cannot hold a book to read.  She has been trying to exercise her shoulders b/c there was no surgery indicated for her.    Right knee is very creaky now.  It's her good knee.    Says she gets up, takes pills, may eat breakfast, may eat lunch, eats dinner, sleeps.  Arthritis in hands has made typing worse so she does not communicate via computer either. Discussed only using nsaids 1-2 times per week to avoiding affecting her kidneys or causing GI bleeding.    Says she is not depressed, but does not feel good, happy ever.  She feels more angry.   No suicidal thoughts.  Just wants to sleep.  Sees no end to her problems.    Past Medical History:  Diagnosis Date  . Anemia   . Anxiety    sometimes   .  Bruise    right lower extremity with opsite on  . Chronic back pain    "my entire spine"  . Chronic bronchitis ([place]) 10/31/2011   "prone to it; I have sinus/bronchitis problem probably q yr"  . Complication of anesthesia 2009   htn with block- for hand surgery , pt thinks anesthesia causing some long term confusion  . Depression   . Diarrhea 01/27/2015  . Dizziness   . Dysrhythmia    pt states bring off coumadin for paroxysmal atrial fibrillation for last month  . Edema   . Encounter for long-term (current) use of other medications   . Falls   . H/O clavicle fracture    left  . Hard of hearing   . History of blood transfusion   . Hypertension    pcp   dr Tarez Bowns   [facility]  . Hypothyroidism   . Hypothyroidism   . Multiple skin tears 10/31/2011   "get them very easily; my skin is thin and I bruise easily"  . Other and unspecified hyperlipidemia   . Peripheral vascular disease ([place])   . Pneumonia 2008  . Rheumatoid arthritis(714.0)    degenerative disc disease , ra  . Senile osteoporosis   . Spinal  stenosis, unspecified region other than cervical   . Superficial thrombophlebitis 04/20/2015  . Swelling of right upper extremity 11/26/2014  . Ulcerative colitis ([place])   . Unspecified glaucoma(365.9)     Past Surgical History:  Procedure Laterality Date  . ANTERIOR CERVICAL DECOMP/DISCECTOMY FUSION N/A 02/17/2014   Procedure: CERVICAL FOUR TO FIVE ANTERIOR CERVICAL DECOMPRESSION/DISCECTOMY FUSION 1 LEVEL;  Surgeon: [name], MD;  Location: [facility];  Service: Neurosurgery;  Laterality: N/A;  C4-5 Anterior cervical decompression/diskectomy/fusion  . BACK SURGERY    . CATARACT EXTRACTION Left 11/11/2013  . CATARACT EXTRACTION Right 12/30/2013  . EXCISIONAL TOTAL KNEE ARTHROPLASTY WITH ANTIBIOTIC SPACERS Left 03/01/2015   Procedure: RESECTION LEFT TOTAL KNEE ARTHROPLASTY WITH PLACEMENT OF  ANTIBIOTIC SPACERS;  Surgeon: Paralee Cancel, MD;  Location: [facility];  Service:  Orthopedics;  Laterality: Left;  . EYE SURGERY Bilateral   . FUNCTIONAL ENDOSCOPIC SINUS SURGERY  1988  . I&D KNEE WITH POLY EXCHANGE Left 10/13/2014   Procedure: INCISION AND DRAINAGE LEFT TOTAL KNEE WITH POLY EXCHANGE;  Surgeon: Paralee Cancel, MD;  Location: [facility];  Service: Orthopedics;  Laterality: Left;  . JOINT REPLACEMENT  2009; 2006   joints 2 fingers,rt hand; joint left thumb  . POSTERIOR FUSION LUMBAR SPINE  10/31/2011   L2-3; 3-4  . POSTERIOR FUSION LUMBAR SPINE  11/2009   L3-4;  L4-5  . POSTERIOR LUMBAR FUSION N/A 08/05/13   T1, T2  . REIMPLANTATION OF TOTAL KNEE Left 07/05/2015   Procedure: REIMPLANTATION OF LEFT TOTAL KNEE WITH REMOVAL OF ANTIBIOTIC SPACER;  Surgeon: Paralee Cancel, MD;  Location: [facility];  Service: Orthopedics;  Laterality: Left;  . SINUS SURGERY WITH INSTATRAK    . SPINE SURGERY    . THYROIDECTOMY, PARTIAL  1997  . TONSILLECTOMY     "when I was a small child"  . TOTAL KNEE ARTHROPLASTY Left 06/16/2014   Procedure: TOTAL KNEE ARTHROPLASTY;  Surgeon: Paralee Cancel, MD;  Location: [facility];  Service: Orthopedics;  Laterality: Left;    No Known Allergies    Medication List       Accurate as of 02/07/16  3:34 PM. Always use your most recent med list.          apixaban 5 MG Tabs tablet Commonly known as:  ELIQUIS Take 1 tablet (5 mg total) by mouth 2 (two) times daily.   balsalazide 750 MG capsule Commonly known as:  COLAZAL TAKE 3 CAPSULES TWICE DAILY   bismuth subsalicylate [id number] suspension Commonly known as:  PEPTO BISMOL Take 15 mLs by mouth 4 (four) times daily -  before meals and at bedtime.   Calcium + D3 600-200 MG-UNIT Tabs Take 1 tablet by mouth daily with breakfast.   cefUROXime 500 MG tablet Commonly known as:  CEFTIN Take 1 tablet (500 mg total) by mouth daily.   celecoxib 200 MG capsule Commonly known as:  CELEBREX Take 1 capsule (200 mg total) by mouth 2 (two) times daily.   collagenase ointment Commonly known as:   SANTYL Apply 1 application topically daily. For wound   diazepam 5 MG tablet Commonly known as:  VALIUM Take 1 tablet (5 mg total) by mouth every 12 (twelve) hours as needed for muscle spasms.   estradiol 0.5 MG tablet Commonly known as:  ESTRACE Take 0.5 mg by mouth daily.   ferrous sulfate 325 (65 FE) MG tablet Take 325 mg by mouth 2 (two) times daily with a meal.   folic acid 1 MG tablet Commonly known as:  FOLVITE Take 2 mg by mouth daily.   furosemide 40 MG tablet Commonly known as:  LASIX Take 1 tablet (40 mg total) by mouth daily.   latanoprost 0.005 % ophthalmic solution Commonly known as:  XALATAN Place 1 drop into both eyes at bedtime.   levothyroxine 75 MCG tablet Commonly known as:  SYNTHROID, LEVOTHROID Take 1 tablet (75 mcg total) by mouth daily.   medroxyPROGESTERone 2.5 MG tablet Commonly known as:  PROVERA Take 1 tablet (2.5 mg total) by mouth at bedtime.   metoprolol tartrate 25 MG tablet Commonly known as:  LOPRESSOR Take 1 tablet (25 mg total) by mouth 2 (two) times daily.   multivitamin with minerals Tabs tablet Take 1 tablet by mouth daily with lunch.   Oxycodone HCl 10 MG Tabs Take 1 tablet (10 mg total) by mouth 3 (three) times daily as needed (severe pain).   polyethylene glycol packet Commonly known as:  MIRALAX / GLYCOLAX Take 17 g by mouth daily as needed for mild constipation.   potassium chloride SA 20 MEQ tablet Commonly known as:  KLOR-CON M20 Take 1 tablet (20 mEq total) by mouth daily.   predniSONE 1 MG tablet Commonly known as:  DELTASONE Take 4 tablets (4 mg total) by mouth daily.   SENNA S PO Take 1 tablet by mouth daily as needed (constipation.).   simethicone 125 MG chewable tablet Commonly known as:  MYLICON Chew 0000000 mg by mouth every 8 (eight) hours as needed.   spironolactone 25 MG tablet Commonly known as:  ALDACTONE TAKE 1 TABLET MONDAY, WEDNESDAY, FRIDAY ONLY       Review of Systems:  Review of Systems   Constitutional: Positive for malaise/fatigue. Negative for chills and fever.  HENT: Positive for hearing loss. Negative for congestion.   Eyes: Negative for blurred vision.  Respiratory: Negative for cough and shortness of breath.   Cardiovascular: Positive for leg swelling. Negative for chest pain and palpitations.  Gastrointestinal: Negative for abdominal pain, blood in stool, constipation and melena.  Genitourinary: Negative for dysuria.  Musculoskeletal: Positive for falls, joint pain and myalgias.  Skin: Negative for itching and rash.       Ulcer left leg remains--being treated by home care nursing  Neurological: Positive for weakness and headaches. Negative for dizziness and loss of consciousness.  Endo/Heme/Allergies: Bruises/bleeds easily.  Psychiatric/Behavioral: Positive for depression and memory loss.       Denies depression but then admits to all signs of depression--refuses meds    Health Maintenance  Topic Date Due  . ZOSTAVAX  03/09/2017 (Originally 01/26/1997)  . PNA vac Low Risk Adult (2 of 2 - PPSV23) 03/09/2017 (Originally 03/27/2015)  . TETANUS/TDAP  08/15/2024  . INFLUENZA VACCINE  Completed  . DEXA SCAN  Completed    Physical Exam: Vitals:   02/07/16 1517  BP: 130/68  Pulse: 60  Temp: 98.5 F (36.9 C)  TempSrc: Oral  SpO2: 98%  Weight: 137 lb (62.1 kg)   Body mass index is 21.46 kg/m. Physical Exam  Constitutional: She is oriented to person, place, and time. No distress.  Chronically ill appearing white female walks with walker, but very unsteady and has great difficulty getting up and down  Cardiovascular: Normal rate, regular rhythm, normal heart sounds and intact distal pulses.   Pulmonary/Chest: Effort normal and breath sounds normal. No respiratory distress.  Abdominal: Soft. Bowel sounds are normal.  Musculoskeletal:  More difficulty getting up out of chair  Neurological: She is alert and oriented to person,  place, and time.  Short term  memory loss during visit especially about medications  Skin: Skin is warm and dry.  Legs are wrapped per directions and to be changed in 2 days and we do not have supplies to replace it here if we take it off--nurse from home care to call or text me pics of wound when dressing changes are done to determine next steps    Labs reviewed: Basic Metabolic Panel:  Recent Labs  07/07/15 0457 09/03/15 1048 09/22/15 1630 12/06/15 1545 01/17/16 1546  NA 143 137 137  --   --   K 4.9 4.1 5.1  --   --   CL 112* 97 104  --   --   CO2 23 21 21   --   --   GLUCOSE 105* 82 90  --   --   BUN 38* 33* 30*  --   --   CREATININE 1.24* 0.97 1.07*  --   --   CALCIUM 8.3* 9.6 9.0  --   --   TSH  --  0.668  --  0.23* 0.93   Liver Function Tests:  Recent Labs  07/07/15 0457  AST 22  ALT 7*  ALKPHOS 153*  BILITOT 0.4  PROT 5.0*  ALBUMIN 2.5*   No results for input(s): LIPASE, AMYLASE in the last 8760 hours. No results for input(s): AMMONIA in the last 8760 hours. CBC:  Recent Labs  03/26/15 0405  04/20/15 1451 06/28/15 1430 07/06/15 0508 07/07/15 0457 09/03/15 1048  WBC 5.4  --  6.1 6.9 10.5 11.6* 9.7  NEUTROABS 3.0  --  4.7  --   --   --  8.0*  HGB 8.7*  < > 10.3* 12.0 10.4* 9.6*  --   HCT 28.4*  < > 33.6* 37.9 32.5* 29.3* 34.4  MCV 95.0  --  93.6 87.5 84.0 85.2 90  PLT 247  --  375 269 204 203 380*  < > = values in this interval not displayed. Lipid Panel: No results for input(s): CHOL, HDL, LDLCALC, TRIG, CHOLHDL, LDLDIRECT in the last 8760 hours. No results found for: HGBA1C  Procedures since last visit: No results found.  Assessment/Plan 1. Venous ulcer of left leg ([place]) -cont current regimen per home health nurse -she is to call or text me in 2 days when dressings are changed so I know what the wound looks like and we can determine next steps -she has not returned to the wound care center  2. Chronic venous insufficiency -counseled on need for wraps to continue to  prevent worsening edema and blisters that lead to ulcers -she also must elevate her feet at rest  3. Meningiomas, multiple ([place]) -not a good operative candidate considering all of her other comorbidities and recent joint infection -also too frail to go thru this -hopefully, headaches do not progress  4. Rheumatoid arthritis involving multiple sites with positive rheumatoid factor (HCC) -arthritic pains are worse--she's less able to get around, the more she sits, the stiffer she gets and more pain she has -I did up her pain medication frequency today to accommodate this  Oxycodone HCl 10 MG TABS; Take 1 tablet (10 mg total) by mouth 4 (four) times daily as needed (severe pain).  Dispense: 120 tablet; Refill: 0 -due to worsening weakness, I also planned to add on home health PT when I spoke with the nurse in f/u about the ulcer  5. Chronic ulcerative colitis without complication, unspecified location [facility]) -immunosuppressive therapy remains  on hold due to chronic joint infection and need for lifelong abx for left knee so she will not be able to take the humira again  6. Paroxysmal atrial fibrillation (HCC) -continues on eliquis therapy 5mg  po bid, will be sure to f/u renal function upcoming  Labs/tests ordered: No orders of the defined types were placed in this encounter.  Next appt:  04/10/2016   Redell Nazir L. Amela Handley, D.O. [facility] [address], [place] [address] Cell Phone (Mon-Fri 8am-5pm):  [phone] On Call:  [phone] & follow prompts after 5pm & weekends Office Phone:  [phone] Office Fax:  [phone]

## 2016-02-21 DIAGNOSIS — R413 Other amnesia: Secondary | ICD-10-CM | POA: Diagnosis not present

## 2016-02-21 DIAGNOSIS — I83228 Varicose veins of left lower extremity with both ulcer of other part of lower extremity and inflammation: Secondary | ICD-10-CM | POA: Diagnosis not present

## 2016-02-21 DIAGNOSIS — I10 Essential (primary) hypertension: Secondary | ICD-10-CM | POA: Diagnosis not present

## 2016-02-21 DIAGNOSIS — L97822 Non-pressure chronic ulcer of other part of left lower leg with fat layer exposed: Secondary | ICD-10-CM | POA: Diagnosis not present

## 2016-02-21 DIAGNOSIS — R419 Unspecified symptoms and signs involving cognitive functions and awareness: Secondary | ICD-10-CM | POA: Diagnosis not present

## 2016-02-21 DIAGNOSIS — F329 Major depressive disorder, single episode, unspecified: Secondary | ICD-10-CM | POA: Diagnosis not present

## 2016-02-21 DIAGNOSIS — I480 Paroxysmal atrial fibrillation: Secondary | ICD-10-CM | POA: Diagnosis not present

## 2016-02-21 DIAGNOSIS — I739 Peripheral vascular disease, unspecified: Secondary | ICD-10-CM | POA: Diagnosis not present

## 2016-03-07 ENCOUNTER — Other Ambulatory Visit: Payer: Self-pay | Admitting: Internal Medicine

## 2016-03-08 ENCOUNTER — Other Ambulatory Visit: Payer: Self-pay | Admitting: *Deleted

## 2016-03-08 DIAGNOSIS — M05711 Rheumatoid arthritis with rheumatoid factor of right shoulder without organ or systems involvement: Secondary | ICD-10-CM

## 2016-03-08 MED ORDER — OXYCODONE HCL 10 MG PO TABS: 10.0000 mg | ORAL_TABLET | Freq: Four times a day (QID) | ORAL | 0 refills | Status: DC | PRN

## 2016-03-08 NOTE — Telephone Encounter (Signed)
Patient requested and will pick up 

## 2016-03-16 ENCOUNTER — Other Ambulatory Visit: Payer: Self-pay | Admitting: Internal Medicine

## 2016-03-30 ENCOUNTER — Encounter (HOSPITAL_COMMUNITY): Payer: Self-pay

## 2016-03-30 ENCOUNTER — Emergency Department (HOSPITAL_COMMUNITY): Payer: Medicare Other

## 2016-03-30 ENCOUNTER — Observation Stay (HOSPITAL_COMMUNITY)
Admission: EM | Admit: 2016-03-30 | Discharge: 2016-04-01 | Disposition: A | Discharge status: Home / Self Care | Payer: Medicare Other | Attending: Internal Medicine | Admitting: Internal Medicine

## 2016-03-30 DIAGNOSIS — W19XXXA Unspecified fall, initial encounter: Secondary | ICD-10-CM

## 2016-03-30 DIAGNOSIS — W01190A Fall on same level from slipping, tripping and stumbling with subsequent striking against furniture, initial encounter: Secondary | ICD-10-CM | POA: Insufficient documentation

## 2016-03-30 DIAGNOSIS — Z96652 Presence of left artificial knee joint: Secondary | ICD-10-CM | POA: Insufficient documentation

## 2016-03-30 DIAGNOSIS — I872 Venous insufficiency (chronic) (peripheral): Secondary | ICD-10-CM

## 2016-03-30 DIAGNOSIS — S0990XA Unspecified injury of head, initial encounter: Secondary | ICD-10-CM | POA: Diagnosis present

## 2016-03-30 DIAGNOSIS — I5033 Acute on chronic diastolic (congestive) heart failure: Secondary | ICD-10-CM | POA: Diagnosis not present

## 2016-03-30 DIAGNOSIS — Z79899 Other long term (current) drug therapy: Secondary | ICD-10-CM | POA: Insufficient documentation

## 2016-03-30 DIAGNOSIS — M069 Rheumatoid arthritis, unspecified: Secondary | ICD-10-CM | POA: Diagnosis present

## 2016-03-30 DIAGNOSIS — Y92009 Unspecified place in unspecified non-institutional (private) residence as the place of occurrence of the external cause: Secondary | ICD-10-CM | POA: Insufficient documentation

## 2016-03-30 DIAGNOSIS — Z7901 Long term (current) use of anticoagulants: Secondary | ICD-10-CM | POA: Insufficient documentation

## 2016-03-30 DIAGNOSIS — W1800XA Striking against unspecified object with subsequent fall, initial encounter: Secondary | ICD-10-CM

## 2016-03-30 DIAGNOSIS — R197 Diarrhea, unspecified: Secondary | ICD-10-CM | POA: Diagnosis not present

## 2016-03-30 DIAGNOSIS — Z96692 Finger-joint replacement of left hand: Secondary | ICD-10-CM | POA: Insufficient documentation

## 2016-03-30 DIAGNOSIS — E039 Hypothyroidism, unspecified: Secondary | ICD-10-CM | POA: Insufficient documentation

## 2016-03-30 DIAGNOSIS — Z96691 Finger-joint replacement of right hand: Secondary | ICD-10-CM | POA: Insufficient documentation

## 2016-03-30 DIAGNOSIS — R509 Fever, unspecified: Secondary | ICD-10-CM | POA: Insufficient documentation

## 2016-03-30 DIAGNOSIS — K5190 Ulcerative colitis, unspecified, without complications: Secondary | ICD-10-CM | POA: Diagnosis present

## 2016-03-30 DIAGNOSIS — I110 Hypertensive heart disease with heart failure: Secondary | ICD-10-CM | POA: Diagnosis not present

## 2016-03-30 DIAGNOSIS — S0101XA Laceration without foreign body of scalp, initial encounter: Principal | ICD-10-CM | POA: Insufficient documentation

## 2016-03-30 DIAGNOSIS — Y939 Activity, unspecified: Secondary | ICD-10-CM | POA: Insufficient documentation

## 2016-03-30 DIAGNOSIS — F17210 Nicotine dependence, cigarettes, uncomplicated: Secondary | ICD-10-CM | POA: Diagnosis not present

## 2016-03-30 DIAGNOSIS — L03116 Cellulitis of left lower limb: Secondary | ICD-10-CM | POA: Diagnosis not present

## 2016-03-30 DIAGNOSIS — L0390 Cellulitis, unspecified: Secondary | ICD-10-CM | POA: Diagnosis present

## 2016-03-30 DIAGNOSIS — Y999 Unspecified external cause status: Secondary | ICD-10-CM | POA: Diagnosis not present

## 2016-03-30 DIAGNOSIS — I4891 Unspecified atrial fibrillation: Secondary | ICD-10-CM | POA: Insufficient documentation

## 2016-03-30 DIAGNOSIS — R531 Weakness: Secondary | ICD-10-CM

## 2016-03-30 DIAGNOSIS — I10 Essential (primary) hypertension: Secondary | ICD-10-CM | POA: Diagnosis present

## 2016-03-30 HISTORY — DX: Benign neoplasm of cerebral meninges: D32.0

## 2016-03-30 LAB — URINALYSIS, ROUTINE W REFLEX MICROSCOPIC
BACTERIA UA: NONE SEEN
BILIRUBIN URINE: NEGATIVE
Glucose, UA: NEGATIVE mg/dL
Hgb urine dipstick: NEGATIVE
KETONES UR: NEGATIVE mg/dL
LEUKOCYTES UA: NEGATIVE
Nitrite: NEGATIVE
PH: 6 (ref 5.0–8.0)
Protein, ur: NEGATIVE mg/dL
SQUAMOUS EPITHELIAL / LPF: NONE SEEN
Specific Gravity, Urine: 1.015 (ref 1.005–1.030)

## 2016-03-30 LAB — COMPREHENSIVE METABOLIC PANEL
ALT: 15 U/L (ref 14–54)
ANION GAP: 9 (ref 5–15)
AST: 30 U/L (ref 15–41)
Albumin: 3.1 g/dL — ABNORMAL LOW (ref 3.5–5.0)
Alkaline Phosphatase: 122 U/L (ref 38–126)
BILIRUBIN TOTAL: 0.4 mg/dL (ref 0.3–1.2)
BUN: 40 mg/dL — AB (ref 6–20)
CHLORIDE: 105 mmol/L (ref 101–111)
CO2: 26 mmol/L (ref 22–32)
Calcium: 9 mg/dL (ref 8.9–10.3)
Creatinine, Ser: 1.11 mg/dL — ABNORMAL HIGH (ref 0.44–1.00)
GFR calc Af Amer: 53 mL/min — ABNORMAL LOW (ref >60)
GFR, EST NON AFRICAN AMERICAN: 46 mL/min — AB (ref >60)
Glucose, Bld: 105 mg/dL — ABNORMAL HIGH (ref 65–99)
POTASSIUM: 4.5 mmol/L (ref 3.5–5.1)
Sodium: 140 mmol/L (ref 135–145)
TOTAL PROTEIN: 6.3 g/dL — AB (ref 6.5–8.1)

## 2016-03-30 LAB — MAGNESIUM: MAGNESIUM: 2 mg/dL (ref 1.7–2.4)

## 2016-03-30 LAB — CBC WITH DIFFERENTIAL/PLATELET
BASOS ABS: 0 10*3/uL (ref 0.0–0.1)
Basophils Relative: 0 %
Eosinophils Absolute: 0.1 10*3/uL (ref 0.0–0.7)
Eosinophils Relative: 1 %
HEMATOCRIT: 33.9 % — AB (ref 36.0–46.0)
HEMOGLOBIN: 10.4 g/dL — AB (ref 12.0–15.0)
LYMPHS PCT: 11 %
Lymphs Abs: 1.1 10*3/uL (ref 0.7–4.0)
MCH: 26.1 pg (ref 26.0–34.0)
MCHC: 30.7 g/dL (ref 30.0–36.0)
MCV: 85 fL (ref 78.0–100.0)
Monocytes Absolute: 0.7 10*3/uL (ref 0.1–1.0)
Monocytes Relative: 7 %
NEUTROS ABS: 8.2 10*3/uL — AB (ref 1.7–7.7)
NEUTROS PCT: 81 %
PLATELETS: 337 10*3/uL (ref 150–400)
RBC: 3.99 MIL/uL (ref 3.87–5.11)
RDW: 18.7 % — ABNORMAL HIGH (ref 11.5–15.5)
WBC: 10.1 10*3/uL (ref 4.0–10.5)

## 2016-03-30 LAB — INFLUENZA PANEL BY PCR (TYPE A & B)
INFLAPCR: NEGATIVE
INFLBPCR: NEGATIVE

## 2016-03-30 LAB — POC OCCULT BLOOD, ED: FECAL OCCULT BLD: NEGATIVE

## 2016-03-30 LAB — I-STAT CG4 LACTIC ACID, ED
LACTIC ACID, VENOUS: 1.73 mmol/L (ref 0.5–1.9)
Lactic Acid, Venous: 1.69 mmol/L (ref 0.5–1.9)

## 2016-03-30 MED ORDER — LEVOTHYROXINE SODIUM 75 MCG PO TABS
75.0000 ug | ORAL_TABLET | Freq: Every day | ORAL | Status: DC
  Administered 2016-03-31 – 2016-04-01 (×2): 75 ug via ORAL
  Filled 2016-03-30 (×2): qty 1

## 2016-03-30 MED ORDER — TETANUS-DIPHTH-ACELL PERTUSSIS 5-2.5-18.5 LF-MCG/0.5 IM SUSP
0.5000 mL | Freq: Once | INTRAMUSCULAR | Status: AC
  Administered 2016-03-30: 0.5 mL via INTRAMUSCULAR
  Filled 2016-03-30: qty 0.5

## 2016-03-30 MED ORDER — LATANOPROST 0.005 % OP SOLN
1.0000 [drp] | Freq: Every day | OPHTHALMIC | Status: DC
  Administered 2016-03-31 (×2): 1 [drp] via OPHTHALMIC
  Filled 2016-03-30 (×2): qty 2.5

## 2016-03-30 MED ORDER — VANCOMYCIN HCL IN DEXTROSE 750-5 MG/150ML-% IV SOLN
750.0000 mg | INTRAVENOUS | Status: DC
  Administered 2016-03-31 – 2016-04-01 (×2): 750 mg via INTRAVENOUS
  Filled 2016-03-30 (×3): qty 150

## 2016-03-30 MED ORDER — OXYCODONE HCL 10 MG PO TABS: 10.0000 mg | ORAL_TABLET | Freq: Two times a day (BID) | ORAL | Status: DC | PRN

## 2016-03-30 MED ORDER — LIDOCAINE-EPINEPHRINE 2 %-1:100000 IJ SOLN
20.0000 mL | Freq: Once | INTRAMUSCULAR | Status: AC
  Administered 2016-03-30: 20 mL
  Filled 2016-03-30: qty 20

## 2016-03-30 MED ORDER — CEFUROXIME AXETIL 500 MG PO TABS: 500.0000 mg | ORAL_TABLET | Freq: Every day | ORAL | Status: DC

## 2016-03-30 MED ORDER — VANCOMYCIN HCL IN DEXTROSE 1-5 GM/200ML-% IV SOLN
1000.0000 mg | Freq: Once | INTRAVENOUS | Status: AC
  Administered 2016-03-30: 1000 mg via INTRAVENOUS
  Filled 2016-03-30: qty 200

## 2016-03-30 MED ORDER — CELECOXIB 200 MG PO CAPS
200.0000 mg | ORAL_CAPSULE | Freq: Two times a day (BID) | ORAL | Status: DC
  Administered 2016-03-30 – 2016-03-31 (×3): 200 mg via ORAL
  Filled 2016-03-30 (×4): qty 1

## 2016-03-30 MED ORDER — FOLIC ACID 1 MG PO TABS
1.0000 mg | ORAL_TABLET | Freq: Every day | ORAL | Status: DC
  Administered 2016-03-30 – 2016-04-01 (×3): 1 mg via ORAL
  Filled 2016-03-30 (×3): qty 1

## 2016-03-30 MED ORDER — APIXABAN 5 MG PO TABS
5.0000 mg | ORAL_TABLET | Freq: Two times a day (BID) | ORAL | Status: DC
  Administered 2016-03-30 – 2016-04-01 (×5): 5 mg via ORAL
  Filled 2016-03-30 (×5): qty 1

## 2016-03-30 MED ORDER — PREDNISONE 1 MG PO TABS
4.0000 mg | ORAL_TABLET | Freq: Every day | ORAL | Status: DC
  Administered 2016-03-30 – 2016-04-01 (×3): 4 mg via ORAL
  Filled 2016-03-30 (×3): qty 4

## 2016-03-30 MED ORDER — PIPERACILLIN-TAZOBACTAM 3.375 G IVPB 30 MIN
3.3750 g | Freq: Once | INTRAVENOUS | Status: AC
  Administered 2016-03-30: 3.375 g via INTRAVENOUS
  Filled 2016-03-30: qty 50

## 2016-03-30 MED ORDER — ACETAMINOPHEN 325 MG PO TABS
650.0000 mg | ORAL_TABLET | Freq: Four times a day (QID) | ORAL | Status: DC | PRN
  Administered 2016-03-30: 650 mg via ORAL
  Filled 2016-03-30 (×2): qty 2

## 2016-03-30 MED ORDER — SPIRONOLACTONE 25 MG PO TABS: 25.0000 mg | ORAL_TABLET | Freq: Once | ORAL | Status: DC

## 2016-03-30 MED ORDER — OXYCODONE HCL 5 MG PO TABS
10.0000 mg | ORAL_TABLET | Freq: Two times a day (BID) | ORAL | Status: DC | PRN
  Administered 2016-03-30 – 2016-04-01 (×3): 10 mg via ORAL
  Filled 2016-03-30 (×4): qty 2

## 2016-03-30 MED ORDER — SODIUM CHLORIDE 0.9 % IV BOLUS (SEPSIS)
1000.0000 mL | Freq: Once | INTRAVENOUS | Status: AC
  Administered 2016-03-30: 1000 mL via INTRAVENOUS

## 2016-03-30 MED ORDER — PIPERACILLIN-TAZOBACTAM 3.375 G IVPB
3.3750 g | Freq: Three times a day (TID) | INTRAVENOUS | Status: DC
  Administered 2016-03-30 – 2016-03-31 (×3): 3.375 g via INTRAVENOUS
  Filled 2016-03-30 (×4): qty 50

## 2016-03-30 MED ORDER — FUROSEMIDE 20 MG PO TABS
20.0000 mg | ORAL_TABLET | Freq: Every day | ORAL | Status: DC
  Administered 2016-03-30 – 2016-03-31 (×2): 20 mg via ORAL
  Filled 2016-03-30 (×2): qty 1

## 2016-03-30 MED ORDER — POTASSIUM CHLORIDE CRYS ER 20 MEQ PO TBCR: 20.0000 meq | EXTENDED_RELEASE_TABLET | Freq: Every day | ORAL | Status: DC

## 2016-03-30 MED ORDER — MEDROXYPROGESTERONE ACETATE 2.5 MG PO TABS
2.5000 mg | ORAL_TABLET | Freq: Every day | ORAL | Status: DC
  Administered 2016-03-30 – 2016-03-31 (×2): 2.5 mg via ORAL
  Filled 2016-03-30 (×3): qty 1

## 2016-03-30 MED ORDER — BISMUTH SUBSALICYLATE 262 MG/15ML PO SUSP
15.0000 mL | Freq: Three times a day (TID) | ORAL | Status: DC
  Filled 2016-03-30 (×2): qty 118

## 2016-03-30 MED ORDER — DILTIAZEM HCL 25 MG/5ML IV SOLN
10.0000 mg | Freq: Once | INTRAVENOUS | Status: DC
  Filled 2016-03-30: qty 5

## 2016-03-30 MED ORDER — METOPROLOL TARTRATE 25 MG PO TABS
25.0000 mg | ORAL_TABLET | Freq: Two times a day (BID) | ORAL | Status: DC
  Administered 2016-03-30: 25 mg via ORAL
  Filled 2016-03-30 (×2): qty 1

## 2016-03-30 MED ORDER — ACETAMINOPHEN 650 MG RE SUPP: 650.0000 mg | Freq: Four times a day (QID) | RECTAL | Status: DC | PRN

## 2016-03-30 MED ORDER — SODIUM CHLORIDE 0.9% FLUSH
3.0000 mL | Freq: Two times a day (BID) | INTRAVENOUS | Status: DC
  Administered 2016-03-30 – 2016-04-01 (×5): 3 mL via INTRAVENOUS

## 2016-03-30 MED ORDER — POLYETHYLENE GLYCOL 3350 17 G PO PACK: 17.0000 g | PACK | Freq: Every day | ORAL | Status: DC | PRN

## 2016-03-30 MED ORDER — FERROUS SULFATE 325 (65 FE) MG PO TABS
325.0000 mg | ORAL_TABLET | Freq: Two times a day (BID) | ORAL | Status: DC
  Administered 2016-03-30 – 2016-04-01 (×5): 325 mg via ORAL
  Filled 2016-03-30 (×5): qty 1

## 2016-03-30 MED ORDER — ESTRADIOL 1 MG PO TABS
0.5000 mg | ORAL_TABLET | Freq: Every day | ORAL | Status: DC
  Administered 2016-03-30 – 2016-04-01 (×3): 0.5 mg via ORAL
  Filled 2016-03-30 (×3): qty 0.5

## 2016-03-30 MED ORDER — BALSALAZIDE DISODIUM 750 MG PO CAPS: 750.0000 mg | ORAL_CAPSULE | Freq: Two times a day (BID) | ORAL | Status: DC

## 2016-03-30 NOTE — ED Notes (Signed)
[name], [place] at bedside, states to hold on the Cardizem. Pts HR 86.

## 2016-03-30 NOTE — ED Notes (Signed)
Pt has returned from CT.  

## 2016-03-30 NOTE — ED Notes (Signed)
Report attempted 

## 2016-03-30 NOTE — Consult Note (Addendum)
[facility] Nurse wound consult note Reason for Consult: Consult requested for legs.  Pt states she wears multi-layer compression wraps to BLE and they are changed once a week prior to admission.  Supplies ordered to the bedside and WOC will apply when available.  Follow-up at 1445: Bilat legs are dry and scaly with generalized edema. Right plantar foot with raised callous; 3X3cm Left knee with abrasion and swelling above skin level; affected area 7X7cm with partial thickness abrasion in the center, 2X2X.1cm, red and moist. Left lower leg with full thickness wound; 6X4X,3cm, 50% red, 50% yellow, mod amt tan drainage, no odor. Foam dressing to left knee abrasion.  Applied Aquacel to left leg wound to absorb drainage and provide antimicrobial benefits.  Applied 4 layer Profore compression wraps to BLE.   Plan to change next Thursday if patient is still in the hospital at that time.  Otherwise, she can resume followup with previous agency after discharge.  Discussed plan of care with patient and she verbalized understanding. Julien Girt MSN, RN, [place], [place], [place]

## 2016-03-30 NOTE — ED Provider Notes (Signed)
[facility] DEPT Provider Note   CSN: [id number] Arrival date & time: 03/30/16  [id number]     History   Chief Complaint Chief Complaint  Patient presents with  . Fall    HPI [name] is a 82 y.o. female.  HPI Patient reportedly fell when getting out of bed this morning striking her head on furniture. She denies loss of consciousness. She reports she felt well prior to the event, other than urinary frequency for the past 1 month. She denies cough. Denies fever. She does admit to chronic pain in both hands as result of rheumatoid arthritis. She denies any neck pain. No treatment prior to coming here.. Past Medical History:  Diagnosis Date  . Anemia   . Anxiety    sometimes   . Bruise    right lower extremity with opsite on  . Chronic back pain    "my entire spine"  . Chronic bronchitis ([place]) 10/31/2011   "prone to it; I have sinus/bronchitis problem probably q yr"  . Complication of anesthesia 2009   htn with block- for hand surgery , pt thinks anesthesia causing some long term confusion  . Depression   . Diarrhea 01/27/2015  . Dizziness   . Dysrhythmia    pt states bring off coumadin for paroxysmal atrial fibrillation for last month  . Edema   . Encounter for long-term (current) use of other medications   . Falls   . H/O clavicle fracture    left  . Hard of hearing   . History of blood transfusion   . Hypertension    pcp   dr reed   [facility]  . Hypothyroidism   . Hypothyroidism   . Multiple skin tears 10/31/2011   "get them very easily; my skin is thin and I bruise easily"  . Other and unspecified hyperlipidemia   . Peripheral vascular disease ([place])   . Pneumonia 2008  . Rheumatoid arthritis(714.0)    degenerative disc disease , ra  . Senile osteoporosis   . Spinal stenosis, unspecified region other than cervical   . Superficial thrombophlebitis 04/20/2015  . Swelling of right upper extremity 11/26/2014  . Ulcerative colitis ([place])   . Unspecified  glaucoma(365.9)     Patient Active Problem List   Diagnosis Date Noted  . Venous ulcer of left leg ([place]) 10/18/2015  . Meningiomas, multiple ([place]) 10/18/2015  . Weakness 08/10/2015  . Malnutrition of moderate degree 07/07/2015  . S/P left TK reimplantation 07/05/2015  . S/P revision of total knee 07/05/2015  . Persistent atrial fibrillation ([place])   . Superficial thrombophlebitis 04/20/2015  . Left TK resection / spacer 03/01/2015  . Diarrhea 01/27/2015  . Atrial fibrillation ([place]) [I48.91] 12/14/2014  . Long term (current) use of anticoagulants [Z79.01] 12/14/2014  . Adhesive capsulitis of right shoulder 12/10/2014  . Swelling of right upper extremity 11/26/2014  . Acute on chronic diastolic CHF (congestive heart failure), NYHA class 1 ([place]) 10/29/2014  . Cellulitis 10/29/2014  . Anemia, iron deficiency 10/29/2014  . Infection of prosthetic left knee joint ([place]) 10/13/2014  . Benign essential HTN   . S/P left TKA 06/16/2014  . Cervical spondylosis with myelopathy 02/17/2014  . Chronic venous stasis dermatitis of both lower extremities 09/08/2013  . Lumbar stenosis with neurogenic claudication 08/05/2013  . Hypothyroidism   . Rheumatoid arthritis ([place]) 11/22/2011  . Ulcerative colitis, chronic ([place]) 11/22/2011    Past Surgical History:  Procedure Laterality Date  . ANTERIOR CERVICAL DECOMP/DISCECTOMY FUSION N/A 02/17/2014  Procedure: CERVICAL FOUR TO FIVE ANTERIOR CERVICAL DECOMPRESSION/DISCECTOMY FUSION 1 LEVEL;  Surgeon: [name], MD;  Location: [facility];  Service: Neurosurgery;  Laterality: N/A;  C4-5 Anterior cervical decompression/diskectomy/fusion  . BACK SURGERY    . CATARACT EXTRACTION Left 11/11/2013  . CATARACT EXTRACTION Right 12/30/2013  . EXCISIONAL TOTAL KNEE ARTHROPLASTY WITH ANTIBIOTIC SPACERS Left 03/01/2015   Procedure: RESECTION LEFT TOTAL KNEE ARTHROPLASTY WITH PLACEMENT OF  ANTIBIOTIC SPACERS;  Surgeon: Paralee Cancel, MD;  Location: [facility];  Service:  Orthopedics;  Laterality: Left;  . EYE SURGERY Bilateral   . FUNCTIONAL ENDOSCOPIC SINUS SURGERY  1988  . I&D KNEE WITH POLY EXCHANGE Left 10/13/2014   Procedure: INCISION AND DRAINAGE LEFT TOTAL KNEE WITH POLY EXCHANGE;  Surgeon: Paralee Cancel, MD;  Location: [facility];  Service: Orthopedics;  Laterality: Left;  . JOINT REPLACEMENT  2009; 2006   joints 2 fingers,rt hand; joint left thumb  . POSTERIOR FUSION LUMBAR SPINE  10/31/2011   L2-3; 3-4  . POSTERIOR FUSION LUMBAR SPINE  11/2009   L3-4;  L4-5  . POSTERIOR LUMBAR FUSION N/A 08/05/13   T1, T2  . REIMPLANTATION OF TOTAL KNEE Left 07/05/2015   Procedure: REIMPLANTATION OF LEFT TOTAL KNEE WITH REMOVAL OF ANTIBIOTIC SPACER;  Surgeon: Paralee Cancel, MD;  Location: [facility];  Service: Orthopedics;  Laterality: Left;  . SINUS SURGERY WITH INSTATRAK    . SPINE SURGERY    . THYROIDECTOMY, PARTIAL  1997  . TONSILLECTOMY     "when I was a small child"  . TOTAL KNEE ARTHROPLASTY Left 06/16/2014   Procedure: TOTAL KNEE ARTHROPLASTY;  Surgeon: Paralee Cancel, MD;  Location: [facility];  Service: Orthopedics;  Laterality: Left;    OB History    No data available       Home Medications    Prior to Admission medications   Medication Sig Start Date End Date Taking? Authorizing Provider  apixaban (ELIQUIS) 5 MG TABS tablet Take 1 tablet (5 mg total) by mouth 2 (two) times daily. 01/06/16   [name] L [name], DO  balsalazide (COLAZAL) 750 MG capsule TAKE 3 CAPSULES TWICE DAILY 01/06/16   [name] L [name], DO  bismuth subsalicylate (PEPTO BISMOL) 262 MG/15ML suspension Take 15 mLs by mouth 4 (four) times daily -  before meals and at bedtime.    Historical Provider, MD  Calcium Carb-Cholecalciferol (CALCIUM + D3) 600-200 MG-UNIT TABS Take 1 tablet by mouth daily with breakfast.     Historical Provider, MD  cefUROXime (CEFTIN) 500 MG tablet Take 1 tablet (500 mg total) by mouth daily. 02/07/16   [name] L [name], DO  celecoxib (CELEBREX) 200 MG capsule Take 1 capsule (200  mg total) by mouth 2 (two) times daily. 01/06/16   [name] L [name], DO  collagenase (SANTYL) ointment Apply 1 application topically daily. For wound 10/29/15   Lauree Chandler, NP  diazepam (VALIUM) 5 MG tablet TAKE 1 TABLET BY MOUTH EVERY 12 HOURS AS NEEDED FOR MUSCLE SPASMS 03/16/16   Lauree Chandler, NP  estradiol (ESTRACE) 0.5 MG tablet Take 0.5 mg by mouth daily. 09/09/15   Historical Provider, MD  ferrous sulfate 325 (65 FE) MG tablet Take 325 mg by mouth 2 (two) times daily with a meal.    Historical Provider, MD  folic acid (FOLVITE) 1 MG tablet Take 2 mg by mouth daily.    Historical Provider, MD  furosemide (LASIX) 40 MG tablet Take 1 tablet (40 mg total) by mouth daily. 01/06/16   [name] L  [name], DO  KLOR-CON M20 20 MEQ tablet TAKE 2 TABLETS BY MOUTH DAILY. 03/07/16   [name] L [name], DO  latanoprost (XALATAN) 0.005 % ophthalmic solution Place 1 drop into both eyes at bedtime.    Historical Provider, MD  levothyroxine (SYNTHROID, LEVOTHROID) 75 MCG tablet Take 1 tablet (75 mcg total) by mouth daily. 01/06/16   [name] L [name], DO  medroxyPROGESTERone (PROVERA) 2.5 MG tablet Take 1 tablet (2.5 mg total) by mouth at bedtime. 01/06/16   [name] L [name], DO  metoprolol tartrate (LOPRESSOR) 25 MG tablet Take 1 tablet (25 mg total) by mouth 2 (two) times daily. 01/06/16   [name] L [name], DO  Multiple Vitamin (MULTIVITAMIN WITH MINERALS) TABS Take 1 tablet by mouth daily with lunch.     Historical Provider, MD  Oxycodone HCl 10 MG TABS Take 1 tablet (10 mg total) by mouth 4 (four) times daily as needed (severe pain). 03/08/16   Estill Dooms, MD  polyethylene glycol [facility]) packet Take 17 g by mouth daily as needed for mild constipation.     Historical Provider, MD  potassium chloride SA (KLOR-CON M20) 20 MEQ tablet Take 1 tablet (20 mEq total) by mouth daily. 01/06/16   [name] L [name], DO  predniSONE (DELTASONE) 1 MG tablet Take 4 tablets (4 mg total) by mouth daily. 01/06/16   [name]  L [name], DO  Sennosides-Docusate Sodium (SENNA S PO) Take 1 tablet by mouth daily as needed (constipation.).    Historical Provider, MD  simethicone (MYLICON) 0000000 MG chewable tablet Chew 125 mg by mouth every 8 (eight) hours as needed.    Historical Provider, MD  spironolactone (ALDACTONE) 25 MG tablet TAKE 1 TABLET MONDAY, WEDNESDAY, FRIDAY ONLY 01/06/16   Gayland Curry, DO    Family History Family History  Problem Relation Age of Onset  . Heart disease Mother   . Cancer Father     lung    Social History Social History  Substance Use Topics  . Smoking status: Current Every Day Smoker    Packs/day: 0.25    Years: 50.00    Types: Cigarettes    Last attempt to quit: 02/20/2015  . Smokeless tobacco: Never Used     Comment: one a day, i dont inhale per pt  . Alcohol use 0.6 oz/week    1 Cans of beer per week     Allergies   Patient has no known allergies.   Review of Systems Review of Systems  Genitourinary: Positive for frequency.       Urinary frequency for one month  Musculoskeletal: Positive for arthralgias and gait problem.       Has walker also uses wheelchair  Skin: Positive for wound.       Scalp laceration. Bilateral legs and Unna boots for long-standing wounds  Hematological: Bruises/bleeds easily.  All other systems reviewed and are negative.    Physical Exam Updated Vital Signs BP 130/76   Pulse (!) 138   Temp 100.5 F (38.1 C) (Rectal)   Resp 19   SpO2 100%   Physical Exam  Constitutional:  Chronically ill-appearing. Febrile  HENT:  Head: Normocephalic and atraumatic.  Wound occipital scalp. Hair matted. Wound not well visualized initially. Hair trimmed to reveal a 3 cm crescent-shaped laceration at occipital scalp  Eyes: Conjunctivae are normal. Pupils are equal, round, and reactive to light.  Neck: Neck supple. No tracheal deviation present. No thyromegaly present.  Cardiovascular:  No murmur heard. Tachycardic irregularly irregular  Pulmonary/Chest: Effort normal and breath sounds normal.  Abdominal: Soft. Bowel sounds are normal. She exhibits no distension. There is no tenderness.  Genitourinary: Rectal exam shows guaiac negative stool.  Genitourinary Comments: Rectal normal tone brown stool  Musculoskeletal: Normal range of motion. She exhibits no edema or tenderness.  Bilateral lower extremities and boots. Bilateral upper extremities with muscular atrophy. Right upper extremity in a Velcro wrist splint entire spine is nontender. Pelvis stable nontender. Unna boots were removed to reveal bilateral lower extremities with dry peeling flaky skin. Lower extremities are reddened but below the knees bilaterally, left greater than right, nontender. DP pulses 2+ bilaterally. 1+ pretibial and pedal edema bilaterally  Neurological: She is alert. No cranial nerve deficit. Coordination normal.  Glasgow Coma Score 15 moves all extremity as well  Skin: Skin is warm and dry. Capillary refill takes less than 2 seconds. No rash noted.  Psychiatric: She has a normal mood and affect.  Nursing note and vitals reviewed.    ED Treatments / Results  Labs (all labs ordered are listed, but only abnormal results are displayed) Labs Reviewed  CULTURE, BLOOD (ROUTINE X 2)  CULTURE, BLOOD (ROUTINE X 2)  URINE CULTURE  COMPREHENSIVE METABOLIC PANEL  CBC WITH DIFFERENTIAL/PLATELET  URINALYSIS, ROUTINE W REFLEX MICROSCOPIC  I-STAT CG4 LACTIC ACID, ED  POC OCCULT BLOOD, ED    EKG  EKG Interpretation  Date/Time:  Thursday March 30 2016 07:38:53 EST Ventricular Rate:  126 PR Interval:    QRS Duration: 95 QT Interval:  326 QTC Calculation: 478 R Axis:   73 Text Interpretation:  Atrial fibrillation Low voltage, extremity and precordial leads Baseline wander in lead(s) V1 Since last tracing rate slower Confirmed by Marthena Whitmyer  MD, Jaasia Viglione [phone]) on 03/30/2016 7:41:57 AM      Code sepsis called based on Sirs criteria of fever and  tachycardia. Source of infection uncertain Radiology No results found.  Procedures .Marland KitchenLaceration Repair Date/Time: 03/30/2016 11:01 AM Performed by: Orlie Dakin Authorized by: Orlie Dakin   Consent:    Consent obtained:  Verbal   Consent given by:  Patient   Risks discussed:  Pain Anesthesia (see MAR for exact dosages):    Anesthesia method:  Local infiltration   Local anesthetic:  Lidocaine 2% WITH epi Laceration details:    Location:  Scalp   Scalp location:  Occipital   Length (cm):  3   Depth (mm):  2 Exploration:    Hemostasis achieved with:  Epinephrine   Contaminated: no   Treatment:    Area cleansed with:  Saline   Amount of cleaning:  Standard   Irrigation method:  Syringe Skin repair:    Repair method:  Staples Approximation:    Vermilion border: well-aligned   Post-procedure details:    Dressing:  Open (no dressing)   Patient tolerance of procedure:  Tolerated with difficulty   (including critical care time)  Medications Ordered in ED Medications  Tdap (BOOSTRIX) injection 0.5 mL (not administered)  piperacillin-tazobactam (ZOSYN) IVPB 3.375 g (not administered)  vancomycin (VANCOCIN) IVPB 1000 mg/200 mL premix (not administered)     Initial Impression / Assessment and Plan / ED Course  I have reviewed the triage vital signs and the nursing notes.  Pertinent labs & imaging results that were available during my care of the patient were reviewed by me and considered in my medical decision making (see chart for details).    hard cervical collar placed on patient  cervical collar removed after cervical spine cleared  radiologically. Her cervical spine is felt to be cleared clinically. Hair was trimmed to room reveal a 3 cm crescent-shaped laceration at occipital scalp  11:25 AM patient remains in atrial fibrillation at 125-1 40 bpm. After treatment with 1 L normal saline intravenous fluid bolus .Intravenous Cardizem bolus 10 mg ordered by me. I  consulted hospitalist service who will come to evaluate patient and arrange for overnight stay to telemetry. Staples should be removed in 8 days Final Clinical Impressions(s) / ED Diagnoses  Diagnosis #1 fall #2three cm scalp laceration #3 minor closed head trauma #4 febrile illness #5 atrial fibrillation with rapid ventricular response  Final diagnoses:  None    New Prescriptions New Prescriptions   No medications on file     Orlie Dakin, MD 03/30/16 [phone]

## 2016-03-30 NOTE — ED Triage Notes (Addendum)
PER EMS: pt here for fall at home. She states she tripped over her coffee table and hit her head either on the table or her wheelchair. Injury to back left side of her head. No LOC. + blood thinners. HR 136 irregular. BP-136/78. A&OX4. Dr. Lenna Sciara in room to see patient upon arrival.

## 2016-03-30 NOTE — H&P (Signed)
History and Physical    [name] [id number] DOB: 07-11-1934 DOA: 03/30/2016  PCP: Hollace Kinnier, DO   Patient coming from: home   Chief Complaint: fall and increasing weakness  HPI: [name] is a 82 y.o. female with medical history significant of active colitis, rheumatoid arthritis treated with Remicade, osteoarthritis status post left replacement followed by multiple home over the left knee due to's recurrent postoperative infection, hypothyroidism, atrial fibrillation on anticoagulation therapy, chronic anemia, depression and anxiety who came to the hospital today after she sustained a fall at home. Patient describes that this is purely mechanical fall, she was in a hurry to get up to go to the bathroom and tripped over uneven piece of carpet. She landed on the left side of her head against the leg support of her wheelchair and sustained a large laceration of the scalp losing significant amount of blood. Patient's husband called EMS and upon arrival they found her to be tachycardic with heart rate in 140 and the telemetry strip showed atrial fibrillation. Patient also complains that during the last week or so she's become progressively weak and somnolent, complained of increased frequencies of loose bowel movements (more than 5 episodes a day),   ED Course: On arrival to the emergency department patient had low-grade fever-100.63F (rectal), young she was tachycardic with heart rate 138 and telemetry showing atrial fibrillation. Catheterized UA was not suggestive of UTI, chest x-ray didn't reveal any acute cardiopulmonary findings She had a mild drop of Hgb to 10.4 from 10.6 in June 2017, and 40 and creatinine 1.11 Head CT demonstrated left parietal scalp hematoma, no acute intracranial abnormality except mild sinus disease more notable on the right than on the left EKG showed rapid A. fib with heart rate of 126, no acute ST-T wave changes During my assessment patient was in  Sinus rhythm with HR in 80's-90's  Review of Systems: As per HPI otherwise 10 point review of systems negative.   Ambulatory Status: Uses wheelchair ambulates with a walker  Past Medical History:  Diagnosis Date  . Anemia   . Anxiety    sometimes   . Bruise    right lower extremity with opsite on  . Chronic back pain    "my entire spine"  . Chronic bronchitis ([place]) 10/31/2011   "prone to it; I have sinus/bronchitis problem probably q yr"  . Complication of anesthesia 2009   htn with block- for hand surgery , pt thinks anesthesia causing some long term confusion  . Depression   . Diarrhea 01/27/2015  . Dizziness   . Dysrhythmia    pt states bring off coumadin for paroxysmal atrial fibrillation for last month  . Edema   . Encounter for long-term (current) use of other medications   . Falls   . H/O clavicle fracture    left  . Hard of hearing   . History of blood transfusion   . Hypertension    pcp   dr reed   [facility]  . Hypothyroidism   . Hypothyroidism   . Multiple skin tears 10/31/2011   "get them very easily; my skin is thin and I bruise easily"  . Other and unspecified hyperlipidemia   . Peripheral vascular disease ([place])   . Pneumonia 2008  . Rheumatoid arthritis(714.0)    degenerative disc disease , ra  . Senile osteoporosis   . Spinal stenosis, unspecified region other than cervical   . Superficial thrombophlebitis 04/20/2015  . Swelling of right  upper extremity 11/26/2014  . Ulcerative colitis ([place])   . Unspecified glaucoma(365.9)     Past Surgical History:  Procedure Laterality Date  . ANTERIOR CERVICAL DECOMP/DISCECTOMY FUSION N/A 02/17/2014   Procedure: CERVICAL FOUR TO FIVE ANTERIOR CERVICAL DECOMPRESSION/DISCECTOMY FUSION 1 LEVEL;  Surgeon: [name], MD;  Location: [facility];  Service: Neurosurgery;  Laterality: N/A;  C4-5 Anterior cervical decompression/diskectomy/fusion  . BACK SURGERY    . CATARACT EXTRACTION Left 11/11/2013  . CATARACT  EXTRACTION Right 12/30/2013  . EXCISIONAL TOTAL KNEE ARTHROPLASTY WITH ANTIBIOTIC SPACERS Left 03/01/2015   Procedure: RESECTION LEFT TOTAL KNEE ARTHROPLASTY WITH PLACEMENT OF  ANTIBIOTIC SPACERS;  Surgeon: Paralee Cancel, MD;  Location: [facility];  Service: Orthopedics;  Laterality: Left;  . EYE SURGERY Bilateral   . FUNCTIONAL ENDOSCOPIC SINUS SURGERY  1988  . I&D KNEE WITH POLY EXCHANGE Left 10/13/2014   Procedure: INCISION AND DRAINAGE LEFT TOTAL KNEE WITH POLY EXCHANGE;  Surgeon: Paralee Cancel, MD;  Location: [facility];  Service: Orthopedics;  Laterality: Left;  . JOINT REPLACEMENT  2009; 2006   joints 2 fingers,rt hand; joint left thumb  . POSTERIOR FUSION LUMBAR SPINE  10/31/2011   L2-3; 3-4  . POSTERIOR FUSION LUMBAR SPINE  11/2009   L3-4;  L4-5  . POSTERIOR LUMBAR FUSION N/A 08/05/13   T1, T2  . REIMPLANTATION OF TOTAL KNEE Left 07/05/2015   Procedure: REIMPLANTATION OF LEFT TOTAL KNEE WITH REMOVAL OF ANTIBIOTIC SPACER;  Surgeon: Paralee Cancel, MD;  Location: [facility];  Service: Orthopedics;  Laterality: Left;  . SINUS SURGERY WITH INSTATRAK    . SPINE SURGERY    . THYROIDECTOMY, PARTIAL  1997  . TONSILLECTOMY     "when I was a small child"  . TOTAL KNEE ARTHROPLASTY Left 06/16/2014   Procedure: TOTAL KNEE ARTHROPLASTY;  Surgeon: Paralee Cancel, MD;  Location: [facility];  Service: Orthopedics;  Laterality: Left;    Social History   Social History  . Marital status: Married    Spouse name: N/A  . Number of children: N/A  . Years of education: N/A   Occupational History  . Not on file.   Social History Main Topics  . Smoking status: Current Every Day Smoker    Packs/day: 0.25    Years: 50.00    Types: Cigarettes    Last attempt to quit: 02/20/2015  . Smokeless tobacco: Never Used     Comment: one a day, i dont inhale per pt  . Alcohol use 0.6 oz/week    1 Cans of beer per week  . Drug use: No  . Sexual activity: Not Currently   Other Topics Concern  . Not on file   Social History  Narrative  . No narrative on file    No Known Allergies  Family History  Problem Relation Age of Onset  . Heart disease Mother   . Cancer Father     lung    Prior to Admission medications   Medication Sig Start Date End Date Taking? Authorizing Provider  apixaban (ELIQUIS) 5 MG TABS tablet Take 1 tablet (5 mg total) by mouth 2 (two) times daily. 01/06/16  Yes [name] L [name], DO  balsalazide (COLAZAL) 750 MG capsule TAKE 3 CAPSULES TWICE DAILY 01/06/16  Yes [name] L [name], DO  bismuth subsalicylate (PEPTO BISMOL) 262 MG/15ML suspension Take 15 mLs by mouth 4 (four) times daily -  before meals and at bedtime.   Yes Historical Provider, MD  Calcium Carb-Cholecalciferol (CALCIUM + D3) 600-200 MG-UNIT TABS  Take 1 tablet by mouth daily with breakfast.    Yes Historical Provider, MD  cefUROXime (CEFTIN) 500 MG tablet Take 1 tablet (500 mg total) by mouth daily. 02/07/16  Yes [name] L [name], DO  celecoxib (CELEBREX) 200 MG capsule Take 1 capsule (200 mg total) by mouth 2 (two) times daily. 01/06/16  Yes [name] L [name], DO  collagenase (SANTYL) ointment Apply 1 application topically daily. For wound Patient taking differently: Apply 1 application topically daily as needed. For wound 10/29/15  Yes Lauree Chandler, NP  estradiol (ESTRACE) 0.5 MG tablet Take 0.5 mg by mouth daily. 09/09/15  Yes Historical Provider, MD  ferrous sulfate 325 (65 FE) MG tablet Take 325 mg by mouth 2 (two) times daily with a meal.   Yes Historical Provider, MD  folic acid (FOLVITE) 1 MG tablet Take 1 mg by mouth daily.    Yes Historical Provider, MD  furosemide (LASIX) 40 MG tablet Take 1 tablet (40 mg total) by mouth daily. Patient taking differently: Take 20 mg by mouth daily as needed.  01/06/16  Yes [name] L [name], DO  latanoprost (XALATAN) 0.005 % ophthalmic solution Place 1 drop into both eyes at bedtime.   Yes Historical Provider, MD  levothyroxine (SYNTHROID, LEVOTHROID) 75 MCG tablet Take 1 tablet (75 mcg total)  by mouth daily. 01/06/16  Yes [name] L [name], DO  medroxyPROGESTERone (PROVERA) 2.5 MG tablet Take 1 tablet (2.5 mg total) by mouth at bedtime. 01/06/16  Yes [name] L [name], DO  metoprolol tartrate (LOPRESSOR) 25 MG tablet Take 1 tablet (25 mg total) by mouth 2 (two) times daily. 01/06/16  Yes [name] L [name], DO  Multiple Vitamin (MULTIVITAMIN WITH MINERALS) TABS Take 1 tablet by mouth daily with lunch.    Yes Historical Provider, MD  Oxycodone HCl 10 MG TABS Take 1 tablet (10 mg total) by mouth 4 (four) times daily as needed (severe pain). Patient taking differently: Take 10 mg by mouth 2 (two) times daily as needed (severe pain).  03/08/16  Yes Estill Dooms, MD  polyethylene glycol [facility] / Floria Raveling) packet Take 17 g by mouth daily as needed for mild constipation.    Yes Historical Provider, MD  predniSONE (DELTASONE) 1 MG tablet Take 4 tablets (4 mg total) by mouth daily. 01/06/16  Yes [name] L [name], DO  spironolactone (ALDACTONE) 25 MG tablet TAKE 1 TABLET MONDAY, WEDNESDAY, FRIDAY ONLY 01/06/16  Yes [name] L [name], DO  KLOR-CON M20 20 MEQ tablet TAKE 2 TABLETS BY MOUTH DAILY. Patient taking differently: Take 20 mEq by mouth daily.  03/07/16   Gayland Curry, DO    Physical Exam: Vitals:   03/30/16 1115 03/30/16 1130 03/30/16 1152 03/30/16 1230  BP: 143/96 120/73 114/66 (!) 111/54  Pulse:   91 88  Resp: 15 14 17 13   Temp:      TempSrc:      SpO2:   100% 97%  Weight:      Height:         General: Appears in mild distress d/t headache of trauma Eyes: PERRLA, EOMI, normal lids, iris ENT:  grossly normal hearing, lips & tongue, mucous membranes moist and intact Neck: no lymphoadenopathy, masses or thyromegaly Cardiovascular: RRR no m/r/g. No JVD, carotid bruits. Large bilateral LE edema (2-3+).  Respiratory: bilateral no wheezes, rales, rhonchi or cracles. Normal respiratory effort. No accessory muscle use observed Abdomen: soft, non-tender, non-distended, no organomegaly or  masses appreciated. BS present in all quadrants Skin: no rash, scabbed  ulcer ov er the left knee, skin of the  shins bilaterally is dry, thin and covered with brown flakes, left leg with erythema of the knee and skin below the knee Musculoskeletal: multiple joint deformities of the fingers and toes, ROM limited Psychiatric: grossly normal mood and affect, speech fluent and appropriate, alert and oriented x3 Neurologic: CN II-XII grossly intact, moves all extremities in coordinated fashion, sensation intact  Labs on Admission: I have personally reviewed following labs and imaging studies  CBC, BMP, Lactic acid  GFR: Estimated Creatinine Clearance: 38.5 mL/min (by C-G formula based on SCr of 1.11 mg/dL (H)).   Creatinine Clearance: Estimated Creatinine Clearance: 38.5 mL/min (by C-G formula based on SCr of 1.11 mg/dL (H)).    Radiological Exams on Admission: Dg Chest 2 View  Result Date: 03/30/2016 CLINICAL DATA:  Fever. EXAM: CHEST  2 VIEW COMPARISON:  02/23/2015 FINDINGS: Borderline cardiomegaly. Stable mediastinal contours. Aortic atherosclerosis. Calcified granuloma over the right chest, stable over multiple exams. There is no edema, consolidation, effusion, or pneumothorax. Chronic bilateral rotator cuff tears. Question chronic Hill-Sachs deformity on the left. Chronic fragmentation of the lateral left clavicle. IMPRESSION: Negative for pneumonia or other acute finding. Electronically Signed   By: Monte Fantasia M.D.   On: 03/30/2016 09:59   Ct Head Wo Contrast  Result Date: 03/30/2016 CLINICAL DATA:  Pain following fall EXAM: CT HEAD WITHOUT CONTRAST CT CERVICAL SPINE WITHOUT CONTRAST TECHNIQUE: Multidetector CT imaging of the head and cervical spine was performed following the standard protocol without intravenous contrast. Multiplanar CT image reconstructions of the cervical spine were also generated. COMPARISON:  Cervical spine CT myelogram June 30, 2013; head CT October 09, 2015  FINDINGS: CT HEAD FINDINGS Brain: Moderate diffuse atrophy is stable. There is a stable calcified meningioma along the dura of the right frontal region measuring 2.0 x 0.7 cm. There is no surrounding edema. There is a 6 mm right parasagittal calcified meningioma without surrounding edema as well, stable. There is no new mass. There is no hemorrhage, extra-axial fluid collection, or midline shift. There is patchy small vessel disease in the centra semiovale bilaterally. Elsewhere gray-white compartments appear unremarkable. No evident acute infarct. Vascular: There is no hyperdense vessel. There is extensive calcification in both carotid siphon regions. Skull: There is a sizable left parietal scalp hematoma. No fracture evident. The bony calvarium appears intact. Sinuses/Orbits: There is mucosal thickening in several ethmoid air cells bilaterally, more on the right than on the left. Paranasal sinuses which are visualized elsewhere appear clear. Orbits appear symmetric bilaterally. Other: Mastoid air cells are clear. CT CERVICAL SPINE FINDINGS Alignment: There is cervical dextroscoliosis. There is 4 mm of anterolisthesis of C4 on C5. There is 3 mm of anterolisthesis of C7 on T1. No other spondylolisthesis evident. Skull base and vertebrae: The skull base and craniocervical junction regions appear normal. The patient is status post anterior screw and plate fixation at C4 and C5 with disc spacer at C4-5. Support hardware intact. There is no demonstrable acute fracture. There are no blastic or lytic bone lesions. Soft tissues and spinal canal: Prevertebral soft tissues and predental space regions are normal. There is no cord or canal hematoma seen. There is no high-grade spinal stenosis. Disc levels: There is marked disc space narrowing at C5-6, C6-7, and C7-T1. There is moderate disc space narrowing at C2-3. Disc spacer noted at C4-5. There is multilevel osteoarthritic change with exit foraminal narrowing due to bony  hypertrophy at multiple levels. Exit foraminal narrowing is most  severe on the left at C5-6 where there is impression on the exiting nerve root. There is no frank disc extrusion evident. Upper chest: Visualized lung apices are clear. There is aortic atherosclerosis. Other: There is calcification in the right subclavian artery. There are foci of coronary artery calcification bilaterally. There are nodular lesions in the thyroid consistent with multinodular goiter. IMPRESSION: CT head: Sizable left parietal scalp hematoma. No fracture. Stable atrophy with periventricular small vessel disease. Stable calcified meningiomas in the right frontal region without surrounding edema. No intracranial mass, hemorrhage, or extra-axial fluid collection. No acute appearing infarct. There are foci of arterial vascular calcification. There is ethmoid sinus disease, more notable on the right than on the left. CT cervical spine: No acute fracture. Spondylolisthesis at C4-5 and C7-T1, likely due to underlying spondylolisthesis. Note that there is postoperative fixation anteriorly at C4 and C5. There is multilevel arthropathy. Note that there is marked exit foraminal narrowing on the left at C5-6 with impression on the exit foramen at this level. Aortic atherosclerosis as well as calcification in several arterial vessels including both carotid arteries. Multinodular goiter. Electronically Signed   By: Lowella Grip III M.D.   On: 03/30/2016 09:49   Ct Cervical Spine Wo Contrast  Result Date: 03/30/2016 CLINICAL DATA:  Pain following fall EXAM: CT HEAD WITHOUT CONTRAST CT CERVICAL SPINE WITHOUT CONTRAST TECHNIQUE: Multidetector CT imaging of the head and cervical spine was performed following the standard protocol without intravenous contrast. Multiplanar CT image reconstructions of the cervical spine were also generated. COMPARISON:  Cervical spine CT myelogram June 30, 2013; head CT October 09, 2015 FINDINGS: CT HEAD FINDINGS  Brain: Moderate diffuse atrophy is stable. There is a stable calcified meningioma along the dura of the right frontal region measuring 2.0 x 0.7 cm. There is no surrounding edema. There is a 6 mm right parasagittal calcified meningioma without surrounding edema as well, stable. There is no new mass. There is no hemorrhage, extra-axial fluid collection, or midline shift. There is patchy small vessel disease in the centra semiovale bilaterally. Elsewhere gray-white compartments appear unremarkable. No evident acute infarct. Vascular: There is no hyperdense vessel. There is extensive calcification in both carotid siphon regions. Skull: There is a sizable left parietal scalp hematoma. No fracture evident. The bony calvarium appears intact. Sinuses/Orbits: There is mucosal thickening in several ethmoid air cells bilaterally, more on the right than on the left. Paranasal sinuses which are visualized elsewhere appear clear. Orbits appear symmetric bilaterally. Other: Mastoid air cells are clear. CT CERVICAL SPINE FINDINGS Alignment: There is cervical dextroscoliosis. There is 4 mm of anterolisthesis of C4 on C5. There is 3 mm of anterolisthesis of C7 on T1. No other spondylolisthesis evident. Skull base and vertebrae: The skull base and craniocervical junction regions appear normal. The patient is status post anterior screw and plate fixation at C4 and C5 with disc spacer at C4-5. Support hardware intact. There is no demonstrable acute fracture. There are no blastic or lytic bone lesions. Soft tissues and spinal canal: Prevertebral soft tissues and predental space regions are normal. There is no cord or canal hematoma seen. There is no high-grade spinal stenosis. Disc levels: There is marked disc space narrowing at C5-6, C6-7, and C7-T1. There is moderate disc space narrowing at C2-3. Disc spacer noted at C4-5. There is multilevel osteoarthritic change with exit foraminal narrowing due to bony hypertrophy at multiple  levels. Exit foraminal narrowing is most severe on the left at C5-6 where there is impression on  the exiting nerve root. There is no frank disc extrusion evident. Upper chest: Visualized lung apices are clear. There is aortic atherosclerosis. Other: There is calcification in the right subclavian artery. There are foci of coronary artery calcification bilaterally. There are nodular lesions in the thyroid consistent with multinodular goiter. IMPRESSION: CT head: Sizable left parietal scalp hematoma. No fracture. Stable atrophy with periventricular small vessel disease. Stable calcified meningiomas in the right frontal region without surrounding edema. No intracranial mass, hemorrhage, or extra-axial fluid collection. No acute appearing infarct. There are foci of arterial vascular calcification. There is ethmoid sinus disease, more notable on the right than on the left. CT cervical spine: No acute fracture. Spondylolisthesis at C4-5 and C7-T1, likely due to underlying spondylolisthesis. Note that there is postoperative fixation anteriorly at C4 and C5. There is multilevel arthropathy. Note that there is marked exit foraminal narrowing on the left at C5-6 with impression on the exit foramen at this level. Aortic atherosclerosis as well as calcification in several arterial vessels including both carotid arteries. Multinodular goiter. Electronically Signed   By: Lowella Grip III M.D.   On: 03/30/2016 09:49    EKG: Independently reviewed - atrial fibrillation with accelerated heart rate and nonspecific ST-T wave changes Assessment/Plan Principal Problem:   Fall against object Active Problems:   Rheumatoid arthritis ([place])   Ulcerative colitis, chronic (HCC)   Hypothyroidism   Chronic venous stasis dermatitis of both lower extremities   Benign essential HTN   Acute on chronic diastolic CHF (congestive heart failure), NYHA class 1 (HCC)   Cellulitis   Atrial fibrillation (HCC) [I48.91]   Diarrhea    Weakness   Fall against object Provide pain control, monitor signs of bleeding and infection  Provide assistance with transfers and ambulation and monitor for safety  PT consult  Cellulitis of the left lower extremity in presence of chronic venous insufficiency Continue IV antibiotics - Vanc and Zosyn Wound care consult to help with dressing  Diarrhea - patient reported increased frequency of loose stools She's been on chronic antibiotic therapy for a left knee infection and cellulitis Unclear if this is flareup of colitis versus C. Difficile given prolonged antibiotic therapy Will check C. difficile PCR  Hypothyroidism The patient is on Synthroid, but it is unclear if she really takes it on not. She said that lately she started ordering her medications mailed to her and she  takes only medications that she can recognize. Patient complained of increasing weakness and somnolence. We'll check TSH  Magnesium level was normal-2.0   Atrial fibrillation - patient is on anticoagulation therapy  Given her frequent falls and unsteady gait she might be at high risk of ICH  CKD stage II - her creatinine is slightly worse than base line Will place on hold aldactone, continue home dose of Lasix    DVT prophylaxis: Eliquis Code Status: DO NOT RESUSCITATE  Family Communication: None  Disposition Plan: Telemetry  Consults called: None Admission status: Observation   Caprice Red Pager: [phone] [facility]  If 7PM-7AM, please contact night-coverage [email or web address] Password [email or web address]  03/30/2016, 1:11 PM

## 2016-03-30 NOTE — Progress Notes (Signed)
New Admission Note:   Arrival Method: Stretcher Mental Orientation: A&O X3  Telemetry: Initiated Assessment: Completed Skin: Stage 1 on sacrum Iv: Clean, Dry, Intact Pain: 7/10 Admission: Completed Unit Orientation: Patient has been orientated to the room, unit and staff.   Orders have been reviewed and implemented. Will continue to monitor the patient. Call light has been placed within reach and bed alarm has been activated.    Dixie Dials RN, BSN

## 2016-03-30 NOTE — Progress Notes (Signed)
Pharmacy Antibiotic Note  [name] is a 82 y.o. female admitted on 03/30/2016 with sepsis.    Plan: Zosyn 3.375 gm iv q8h Vancomycin 1 g x 1 then 750 mg q24h Monitor renal fx cx, vt prn  Height: 5\' 9"  (175.3 cm) Weight: 131 lb (59.4 kg) IBW/kg (Calculated) : 66.2  Temp (24hrs), Avg:99.7 F (37.6 C), Min:98.8 F (37.1 C), Max:100.5 F (38.1 C)   Recent Labs Lab 03/30/16 0805 03/30/16 0826  WBC 10.1  --   CREATININE 1.11*  --   LATICACIDVEN  --  1.73    Estimated Creatinine Clearance: 38.5 mL/min (by C-G formula based on SCr of 1.11 mg/dL (H)).    No Known Allergies  Levester Fresh, PharmD, BCPS, BCCCP Clinical Pharmacist Clinical phone for 03/30/2016 from 7a-3:30p: [phone] If after 3:30p, please call main pharmacy at: [phone] 03/30/2016 9:08 AM

## 2016-03-31 DIAGNOSIS — L03119 Cellulitis of unspecified part of limb: Secondary | ICD-10-CM | POA: Diagnosis not present

## 2016-03-31 DIAGNOSIS — I872 Venous insufficiency (chronic) (peripheral): Secondary | ICD-10-CM | POA: Diagnosis not present

## 2016-03-31 DIAGNOSIS — W1800XA Striking against unspecified object with subsequent fall, initial encounter: Secondary | ICD-10-CM | POA: Diagnosis not present

## 2016-03-31 DIAGNOSIS — E039 Hypothyroidism, unspecified: Secondary | ICD-10-CM

## 2016-03-31 DIAGNOSIS — I10 Essential (primary) hypertension: Secondary | ICD-10-CM

## 2016-03-31 LAB — BASIC METABOLIC PANEL
Anion gap: 8 (ref 5–15)
BUN: 29 mg/dL — ABNORMAL HIGH (ref 6–20)
CALCIUM: 8.1 mg/dL — AB (ref 8.9–10.3)
CO2: 24 mmol/L (ref 22–32)
CREATININE: 1.25 mg/dL — AB (ref 0.44–1.00)
Chloride: 109 mmol/L (ref 101–111)
GFR calc non Af Amer: 40 mL/min — ABNORMAL LOW (ref >60)
GFR, EST AFRICAN AMERICAN: 46 mL/min — AB (ref >60)
Glucose, Bld: 84 mg/dL (ref 65–99)
Potassium: 4 mmol/L (ref 3.5–5.1)
Sodium: 141 mmol/L (ref 135–145)

## 2016-03-31 LAB — URINE CULTURE: CULTURE: NO GROWTH

## 2016-03-31 LAB — CBC
HCT: 23.1 % — ABNORMAL LOW (ref 36.0–46.0)
Hemoglobin: 7.1 g/dL — ABNORMAL LOW (ref 12.0–15.0)
MCH: 26 pg (ref 26.0–34.0)
MCHC: 30.7 g/dL (ref 30.0–36.0)
MCV: 84.6 fL (ref 78.0–100.0)
PLATELETS: 245 10*3/uL (ref 150–400)
RBC: 2.73 MIL/uL — AB (ref 3.87–5.11)
RDW: 19.3 % — ABNORMAL HIGH (ref 11.5–15.5)
WBC: 9.8 10*3/uL (ref 4.0–10.5)

## 2016-03-31 LAB — HEMOGLOBIN AND HEMATOCRIT, BLOOD
HCT: 22.4 % — ABNORMAL LOW (ref 36.0–46.0)
HEMATOCRIT: 28.1 % — AB (ref 36.0–46.0)
HEMOGLOBIN: 7 g/dL — AB (ref 12.0–15.0)
HEMOGLOBIN: 8.8 g/dL — AB (ref 12.0–15.0)

## 2016-03-31 LAB — PREPARE RBC (CROSSMATCH)

## 2016-03-31 MED ORDER — SODIUM CHLORIDE 0.9 % IV SOLN: Freq: Once | INTRAVENOUS | Status: DC

## 2016-03-31 MED ORDER — METOPROLOL TARTRATE 25 MG PO TABS
25.0000 mg | ORAL_TABLET | Freq: Two times a day (BID) | ORAL | Status: DC
  Administered 2016-03-31 – 2016-04-01 (×3): 25 mg via ORAL
  Filled 2016-03-31 (×3): qty 1

## 2016-03-31 MED ORDER — DIPHENHYDRAMINE HCL 50 MG/ML IJ SOLN: 25.0000 mg | Freq: Once | INTRAMUSCULAR | Status: DC

## 2016-03-31 MED ORDER — BALSALAZIDE DISODIUM 750 MG PO CAPS
750.0000 mg | ORAL_CAPSULE | Freq: Two times a day (BID) | ORAL | Status: DC
  Filled 2016-03-31: qty 1

## 2016-03-31 MED ORDER — FUROSEMIDE 10 MG/ML IJ SOLN: 20.0000 mg | Freq: Once | INTRAMUSCULAR | Status: DC

## 2016-03-31 MED ORDER — BALSALAZIDE DISODIUM 750 MG PO CAPS
750.0000 mg | ORAL_CAPSULE | Freq: Two times a day (BID) | ORAL | Status: DC
  Administered 2016-03-31 – 2016-04-01 (×3): 750 mg via ORAL
  Filled 2016-03-31 (×3): qty 1

## 2016-03-31 MED ORDER — ACETAMINOPHEN 325 MG PO TABS
650.0000 mg | ORAL_TABLET | Freq: Once | ORAL | Status: AC
  Administered 2016-03-31: 650 mg via ORAL
  Filled 2016-03-31: qty 2

## 2016-03-31 NOTE — Care Management Obs Status (Signed)
[place] NOTIFICATION   Patient Details  Name: [name] MRN: [id number] Date of Birth: 07-09-1934   Medicare Observation Status Notification Given:  Yes    Janelis Stelzer, Rory Percy, RN 03/31/2016, 12:45 PM

## 2016-03-31 NOTE — Plan of Care (Signed)
BP 83/46 at bedtime, MD on call notified. BP improved to 92/46 after a bolus of saline. BP meds held.

## 2016-03-31 NOTE — Progress Notes (Signed)
PROGRESS NOTE        PATIENT DETAILS Name: [name] Age: 82 y.o. Sex: female Date of Birth: [birth year] Admit Date: 03/30/2016 Admitting Physician Elwin Mocha, MD WI:[id number], [name], DO  Brief Narrative: Patient is a 82 y.o. female with a PMH significant for atrial fibrillation, CHF, hypothyroidism, RA, and UC who presented to ED on 03/30/16 after sustaining a mechanical fall with a left parietal laceration-requiring suturing. In the ED she was found to have Afib with RVR. She was admitted for further evaluation and treatment. See below for further details.   Subjective: Lying comfortably in bed-DENIES diarrhea.   Assessment/Plan: Mechanical Fall with Left parietal scalp hematoma:CT head with no acute intracranial abnormalities-does show a left parietal scalp hematoma. CT C spine-no fracture. Patient is AOX3 today.Staples place in the ED-per ED note-need to be removed after 8 days.Last Tdap shots were 03/30/16 and 08/16/14. Continue supportive care and consult with PT.      ?Diarrhea: Resolved-per RN one soft stool today. Doubt need for stool studies. Has UC and some chronic diarrhea at baseline.   Chronic diastolic CHF (congestive heart failure), NYHA class 1: Compensated-Probable due to hypertensive heart disease. Last echocardiogram on 04/17/14 showed EF of 60-65% with no wall motion abnormalities. Continue furosemide 20 mg po.    Atrial fibrillation RVR: RVR was on admission, but spontaneously converted back to sinus rhythm. Continue Metoprolol.CHA2DS2-VASc is 6-on Eliquis.   Chronic venous stasis dermatitis of both lower extremities with superimposed Cellulitis: Probable etiology chronic venous hypertension. B/L una boot are in place. Suspect we can just treat cellulitis with Vancomycin-therefore will stop Zosyn  Acute on Chronic anemia: has anemia due to chronic disease (UC and RA)-worsening anemia ?from large amount of blood loss from scalp hemorrhage.  No other foci of blood loss apparent-Stool FOBT negative.Will cautiously continue with Eliquis for now-transfuse 1 unit of PRBC. Patient is hemodynamically stable-if develops hypotension-or anemia worsens will then obtain a CT Abd to rule out a retro-peritoneal bleed (although doubt at this time). Repeat CBC in am  AKI: probably mild pre-renal azotemia due to blood loss-follow lytes  Benign essential [id number]- Continue metoprolol tartrate.   Rheumatoid arthritis : Stable during this hospitalization. Continue prednisone-but hold Celecoxib for now. Continue pain management with acetaminophen Q6H prn.    Ulcerative colitis, chronic: Currently denies any diarrhea or abdominal discomfit. Continue to monitor daily BM status. Continue balsalazide and prednisone.    Hypothyroidism:  Continue levothyroxine.   DVT Prophylaxis: Full dose anticoagulation with Eliquis  Code Status: DNR  Family Communication: None  Disposition Plan: Remain inpatient  Antimicrobial agents: Anti-infectives    Start     Dose/Rate Route Frequency Ordered Stop   03/31/16 0800  vancomycin (VANCOCIN) IVPB 750 mg/150 ml premix     750 mg 150 mL/hr over 60 Minutes Intravenous Every 24 hours 03/30/16 0908     03/30/16 1600  piperacillin-tazobactam (ZOSYN) IVPB 3.375 g     3.375 g 12.5 mL/hr over 240 Minutes Intravenous Every 8 hours 03/30/16 0908     03/30/16 1145  cefUROXime (CEFTIN) tablet 500 mg  Status:  Discontinued     500 mg Oral Daily 03/30/16 1142 03/30/16 1244   03/30/16 0815  piperacillin-tazobactam (ZOSYN) IVPB 3.375 g     3.375 g 100 mL/hr over 30 Minutes Intravenous  Once 03/30/16 0801 03/30/16 0950   03/30/16  0815  vancomycin (VANCOCIN) IVPB 1000 mg/200 mL premix     1,000 mg 200 mL/hr over 60 Minutes Intravenous  Once 03/30/16 0801 03/30/16 0958      Procedures: None at this time.   CONSULTS: None at this time.   Time spent: 25 minutes-Greater than 50% of this time was spent in  counseling, explanation of diagnosis, planning of further management, and coordination of care.  MEDICATIONS: Scheduled Meds: . apixaban  5 mg Oral BID  . balsalazide  750 mg Oral BID  . bismuth subsalicylate  15 mL Oral TID AC & HS  . celecoxib  200 mg Oral BID  . estradiol  0.5 mg Oral Daily  . ferrous sulfate  325 mg Oral BID WC  . folic acid  1 mg Oral Daily  . furosemide  20 mg Oral Daily  . latanoprost  1 drop Both Eyes QHS  . levothyroxine  75 mcg Oral QAC breakfast  . medroxyPROGESTERone  2.5 mg Oral QHS  . metoprolol tartrate  25 mg Oral BID  . piperacillin-tazobactam (ZOSYN)  IV  3.375 g Intravenous Q8H  . predniSONE  4 mg Oral Daily  . sodium chloride flush  3 mL Intravenous Q12H  . vancomycin  750 mg Intravenous Q24H   Continuous Infusions: None PRN Meds:.acetaminophen **OR** acetaminophen, oxyCODONE   PHYSICAL EXAM: Vital signs: Vitals:   03/30/16 2220 03/31/16 0018 03/31/16 0450 03/31/16 0816  BP: (!) 83/46 (!) 92/46 (!) 126/55 (!) 130/59  Pulse:   87 84  Resp:   18 18  Temp:   98.5 F (36.9 C) 98.1 F (36.7 C)  TempSrc:   Oral Oral  SpO2:   100% 100%  Weight:      Height:       Filed Weights   03/30/16 0823 03/30/16 1323 03/30/16 2100  Weight: 59.4 kg (131 lb) 59.7 kg (131 lb 9.8 oz) 59.6 kg (131 lb 6.3 oz)   Body mass index is 19.4 kg/m.   General appearance: Awake, alert, not in any distress. Speech Clear. Not toxic Looking Eyes: PERRLA, no scleral icterus. Pink conjunctiva HEENT: Scalp laceration stapled on left side of head.  Neck: Supple, no JVD.  Resp: Good air entry bilaterally, no added sounds  CVS: Regular rate with irregular rhythm. Auscultation did not reveal any murmurs.  GI: Bowel sounds present, Non tender and not distended with no gaurding, rigidity or rebound.No organomegaly Extremities: B/L Lower Ext shows stasis dermatitis wrapped in una boot. B/L feet are edematous.  Neurology:  speech clear,Non focal, sensation is grossly  intact. Psychiatric: Normal judgment and insight. Alert and oriented x 3. Normal mood. Musculoskeletal:No digital cyanosis Skin: Dry, evidence of stasis dermatitis on lower ext.  Wounds: Stasis ulcers were wrapped in una boot, unable to visualize at this time.   I have personally reviewed following labs and imaging studies  LABORATORY DATA: CBC:  Recent Labs Lab 03/30/16 0805 03/31/16 0801  WBC 10.1 9.8  NEUTROABS 8.2*  --   HGB 10.4* 7.1*  HCT 33.9* 23.1*  MCV 85.0 84.6  PLT 337 [id number]    Basic Metabolic Panel:  Recent Labs Lab 03/30/16 0805 03/30/16 1148 03/31/16 0801  NA 140  --  141  K 4.5  --  4.0  CL 105  --  109  CO2 26  --  24  GLUCOSE 105*  --  84  BUN 40*  --  29*  CREATININE 1.11*  --  1.25*  CALCIUM 9.0  --  8.1*  MG  --  2.0  --    Elevated sCR noted, baseline over 2017 ranged from 0.97-1.24. Continue to monitor with daily BMP.   GFR: Estimated Creatinine Clearance: 34.3 mL/min (by C-G formula based on SCr of 1.25 mg/dL (H)).  Liver Function Tests:  Recent Labs Lab 03/30/16 0805  AST 30  ALT 15  ALKPHOS 122  BILITOT 0.4  PROT 6.3*  ALBUMIN 3.1*   No results for input(s): LIPASE, AMYLASE in the last 168 hours. No results for input(s): AMMONIA in the last 168 hours.  Coagulation Profile: No results for input(s): INR, PROTIME in the last 168 hours.  Cardiac Enzymes: No results for input(s): CKTOTAL, CKMB, CKMBINDEX, TROPONINI in the last 168 hours.  BNP (last 3 results) No results for input(s): PROBNP in the last 8760 hours.  HbA1C: No results for input(s): HGBA1C in the last 72 hours.  CBG: No results for input(s): GLUCAP in the last 168 hours.  Lipid Profile: No results for input(s): CHOL, HDL, LDLCALC, TRIG, CHOLHDL, LDLDIRECT in the last 72 hours.  Thyroid Function Tests: No results for input(s): TSH, T4TOTAL, FREET4, T3FREE, THYROIDAB in the last 72 hours.  Anemia Panel: No results for input(s): VITAMINB12, FOLATE,  FERRITIN, TIBC, IRON, RETICCTPCT in the last 72 hours.  Urine analysis:    Component Value Date/Time   COLORURINE YELLOW 03/30/2016 [place] 03/30/2016 0823   LABSPEC 1.015 03/30/2016 0823   PHURINE 6.0 03/30/2016 0823   GLUCOSEU NEGATIVE 03/30/2016 0823   HGBUR NEGATIVE 03/30/2016 0823   BILIRUBINUR NEGATIVE 03/30/2016 0823   KETONESUR NEGATIVE 03/30/2016 0823   PROTEINUR NEGATIVE 03/30/2016 0823   UROBILINOGEN 0.2 10/13/2014 1505   NITRITE NEGATIVE 03/30/2016 0823   LEUKOCYTESUR NEGATIVE 03/30/2016 0823    Sepsis Labs: Lactic Acid, Venous    Component Value Date/Time   LATICACIDVEN 1.69 03/30/2016 1200    MICROBIOLOGY: Recent Results (from the past 240 hour(s))  Blood Culture (routine x 2)     Status: None (Preliminary result)   Collection Time: 03/30/16  8:05 AM  Result Value Ref Range Status   Specimen Description BLOOD LEFT ANTECUBITAL  Final   Special Requests BOTTLES DRAWN AEROBIC AND ANAEROBIC  5CC  Final   Culture NO GROWTH < 12 HOURS  Final   Report Status PENDING  Incomplete  Blood Culture (routine x 2)     Status: None (Preliminary result)   Collection Time: 03/30/16  8:05 AM  Result Value Ref Range Status   Specimen Description BLOOD RIGHT ANTECUBITAL  Final   Special Requests BOTTLES DRAWN AEROBIC AND ANAEROBIC  5CC  Final   Culture NO GROWTH < 12 HOURS  Final   Report Status PENDING  Incomplete  Urine culture     Status: None   Collection Time: 03/30/16  8:23 AM  Result Value Ref Range Status   Specimen Description URINE, CATHETERIZED  Final   Special Requests NONE  Final   Culture NO GROWTH  Final   Report Status 03/31/2016 FINAL  Final    RADIOLOGY STUDIES/RESULTS: Dg Chest 2 View  Result Date: 03/30/2016 CLINICAL DATA:  Fever. EXAM: CHEST  2 VIEW COMPARISON:  02/23/2015 FINDINGS: Borderline cardiomegaly. Stable mediastinal contours. Aortic atherosclerosis. Calcified granuloma over the right chest, stable over multiple exams.  There is no edema, consolidation, effusion, or pneumothorax. Chronic bilateral rotator cuff tears. Question chronic Hill-Sachs deformity on the left. Chronic fragmentation of the lateral left clavicle. IMPRESSION: Negative for pneumonia or other acute finding. Electronically Signed  By: Monte Fantasia M.D.   On: 03/30/2016 09:59   Ct Head Wo Contrast  Result Date: 03/30/2016 CLINICAL DATA:  Pain following fall EXAM: CT HEAD WITHOUT CONTRAST CT CERVICAL SPINE WITHOUT CONTRAST TECHNIQUE: Multidetector CT imaging of the head and cervical spine was performed following the standard protocol without intravenous contrast. Multiplanar CT image reconstructions of the cervical spine were also generated. COMPARISON:  Cervical spine CT myelogram June 30, 2013; head CT October 09, 2015 FINDINGS: CT HEAD FINDINGS Brain: Moderate diffuse atrophy is stable. There is a stable calcified meningioma along the dura of the right frontal region measuring 2.0 x 0.7 cm. There is no surrounding edema. There is a 6 mm right parasagittal calcified meningioma without surrounding edema as well, stable. There is no new mass. There is no hemorrhage, extra-axial fluid collection, or midline shift. There is patchy small vessel disease in the centra semiovale bilaterally. Elsewhere gray-white compartments appear unremarkable. No evident acute infarct. Vascular: There is no hyperdense vessel. There is extensive calcification in both carotid siphon regions. Skull: There is a sizable left parietal scalp hematoma. No fracture evident. The bony calvarium appears intact. Sinuses/Orbits: There is mucosal thickening in several ethmoid air cells bilaterally, more on the right than on the left. Paranasal sinuses which are visualized elsewhere appear clear. Orbits appear symmetric bilaterally. Other: Mastoid air cells are clear. CT CERVICAL SPINE FINDINGS Alignment: There is cervical dextroscoliosis. There is 4 mm of anterolisthesis of C4 on C5. There is 3  mm of anterolisthesis of C7 on T1. No other spondylolisthesis evident. Skull base and vertebrae: The skull base and craniocervical junction regions appear normal. The patient is status post anterior screw and plate fixation at C4 and C5 with disc spacer at C4-5. Support hardware intact. There is no demonstrable acute fracture. There are no blastic or lytic bone lesions. Soft tissues and spinal canal: Prevertebral soft tissues and predental space regions are normal. There is no cord or canal hematoma seen. There is no high-grade spinal stenosis. Disc levels: There is marked disc space narrowing at C5-6, C6-7, and C7-T1. There is moderate disc space narrowing at C2-3. Disc spacer noted at C4-5. There is multilevel osteoarthritic change with exit foraminal narrowing due to bony hypertrophy at multiple levels. Exit foraminal narrowing is most severe on the left at C5-6 where there is impression on the exiting nerve root. There is no frank disc extrusion evident. Upper chest: Visualized lung apices are clear. There is aortic atherosclerosis. Other: There is calcification in the right subclavian artery. There are foci of coronary artery calcification bilaterally. There are nodular lesions in the thyroid consistent with multinodular goiter. IMPRESSION: CT head: Sizable left parietal scalp hematoma. No fracture. Stable atrophy with periventricular small vessel disease. Stable calcified meningiomas in the right frontal region without surrounding edema. No intracranial mass, hemorrhage, or extra-axial fluid collection. No acute appearing infarct. There are foci of arterial vascular calcification. There is ethmoid sinus disease, more notable on the right than on the left. CT cervical spine: No acute fracture. Spondylolisthesis at C4-5 and C7-T1, likely due to underlying spondylolisthesis. Note that there is postoperative fixation anteriorly at C4 and C5. There is multilevel arthropathy. Note that there is marked exit foraminal  narrowing on the left at C5-6 with impression on the exit foramen at this level. Aortic atherosclerosis as well as calcification in several arterial vessels including both carotid arteries. Multinodular goiter. Electronically Signed   By: Lowella Grip III M.D.   On: 03/30/2016 09:49  Ct Cervical Spine Wo Contrast  Result Date: 03/30/2016 CLINICAL DATA:  Pain following fall EXAM: CT HEAD WITHOUT CONTRAST CT CERVICAL SPINE WITHOUT CONTRAST TECHNIQUE: Multidetector CT imaging of the head and cervical spine was performed following the standard protocol without intravenous contrast. Multiplanar CT image reconstructions of the cervical spine were also generated. COMPARISON:  Cervical spine CT myelogram June 30, 2013; head CT October 09, 2015 FINDINGS: CT HEAD FINDINGS Brain: Moderate diffuse atrophy is stable. There is a stable calcified meningioma along the dura of the right frontal region measuring 2.0 x 0.7 cm. There is no surrounding edema. There is a 6 mm right parasagittal calcified meningioma without surrounding edema as well, stable. There is no new mass. There is no hemorrhage, extra-axial fluid collection, or midline shift. There is patchy small vessel disease in the centra semiovale bilaterally. Elsewhere gray-white compartments appear unremarkable. No evident acute infarct. Vascular: There is no hyperdense vessel. There is extensive calcification in both carotid siphon regions. Skull: There is a sizable left parietal scalp hematoma. No fracture evident. The bony calvarium appears intact. Sinuses/Orbits: There is mucosal thickening in several ethmoid air cells bilaterally, more on the right than on the left. Paranasal sinuses which are visualized elsewhere appear clear. Orbits appear symmetric bilaterally. Other: Mastoid air cells are clear. CT CERVICAL SPINE FINDINGS Alignment: There is cervical dextroscoliosis. There is 4 mm of anterolisthesis of C4 on C5. There is 3 mm of anterolisthesis of C7 on T1.  No other spondylolisthesis evident. Skull base and vertebrae: The skull base and craniocervical junction regions appear normal. The patient is status post anterior screw and plate fixation at C4 and C5 with disc spacer at C4-5. Support hardware intact. There is no demonstrable acute fracture. There are no blastic or lytic bone lesions. Soft tissues and spinal canal: Prevertebral soft tissues and predental space regions are normal. There is no cord or canal hematoma seen. There is no high-grade spinal stenosis. Disc levels: There is marked disc space narrowing at C5-6, C6-7, and C7-T1. There is moderate disc space narrowing at C2-3. Disc spacer noted at C4-5. There is multilevel osteoarthritic change with exit foraminal narrowing due to bony hypertrophy at multiple levels. Exit foraminal narrowing is most severe on the left at C5-6 where there is impression on the exiting nerve root. There is no frank disc extrusion evident. Upper chest: Visualized lung apices are clear. There is aortic atherosclerosis. Other: There is calcification in the right subclavian artery. There are foci of coronary artery calcification bilaterally. There are nodular lesions in the thyroid consistent with multinodular goiter. IMPRESSION: CT head: Sizable left parietal scalp hematoma. No fracture. Stable atrophy with periventricular small vessel disease. Stable calcified meningiomas in the right frontal region without surrounding edema. No intracranial mass, hemorrhage, or extra-axial fluid collection. No acute appearing infarct. There are foci of arterial vascular calcification. There is ethmoid sinus disease, more notable on the right than on the left. CT cervical spine: No acute fracture. Spondylolisthesis at C4-5 and C7-T1, likely due to underlying spondylolisthesis. Note that there is postoperative fixation anteriorly at C4 and C5. There is multilevel arthropathy. Note that there is marked exit foraminal narrowing on the left at C5-6 with  impression on the exit foramen at this level. Aortic atherosclerosis as well as calcification in several arterial vessels including both carotid arteries. Multinodular goiter. Electronically Signed   By: Lowella Grip III M.D.   On: 03/30/2016 09:49     LOS: 0 days   Rose Clousing, PAS  [place]   If 7PM-7AM, please contact night-coverage [email or web address] Password [facility] 03/31/2016, 9:09 AM  Attending MD note  Patient was seen, examined,treatment plan was discussed with the PA-S.  I have personally reviewed the clinical findings, lab, imaging studies and management of this patient in detail. I agree with the documentation, as recorded by the PA-S.   Patient without any diarrhea this morning Legs wrapped  No other complaints  On Exam: Gen. exam: Awake, alert, not in any distress Chest: Good air entry bilaterally, no rhonchi or rales CVS: S1-S2 regular, no murmurs Abdomen: Soft, nontender and nondistended Neurology: Non-focal  Imp: Mech Fall Afib RVR-back in Sinus Lower ext cellulitis on top of chronic venous stasis ulcers Ulcerative Colitis  Plan PT eval Continue IV Vanco Rest as above   [place] [facility]

## 2016-03-31 NOTE — Care Management Note (Signed)
Case Management Note  Patient Details  Name: [name] MRN: [id number] Date of Birth: 1935/02/21  Subjective/Objective:         CM following for progression and d/c planning.            Action/Plan: 03/31/2016 Noted CM consult for [facility] services, await PT/OT recommendations and orders.   Expected Discharge Date:                  Expected Discharge Plan:  [place]  In-House Referral:  NA  Discharge planning Services  CM Consult  Post Acute Care Choice:  Home Health Choice offered to:     DME Arranged:    DME Agency:     HH Arranged:    HH Agency:     Status of Service:  In process, will continue to follow  If discussed at Long Length of Stay Meetings, dates discussed:    Additional Comments:  Adron Bene, RN 03/31/2016, 12:57 PM

## 2016-03-31 NOTE — Evaluation (Signed)
Physical Therapy Evaluation Patient Details Name: [name] MRN: [id number] DOB: Aug 28, 1934 Today's Date: 03/31/2016   History of Present Illness  Patient is a 82 y.o. female with a PMH significant for atrial fibrillation, CHF, hypothyroidism, RA, and UC who presented to ED on 03/30/16 after sustaining a mechanical fall with a left parietal laceration-requiring suturing. In the ED she was found to have Afib with RVR and with acute on chronic anemia.  Clinical Impression  Pt admitted with/for fall and found to have Afib with RVR.  Pt is likely borderline safe at best with help from her husband, but doesn't want to go to SNF for rehab.  HHPT IF pt's husband can actually care for her safely.  Pt currently limited functionally due to the problems listed below.  (see problems list.)  Pt will benefit from PT to maximize function and safety to be able to get home safely with available assist .     Follow Up Recommendations Home health PT;Supervision/Assistance - 24 hour    Equipment Recommendations  None recommended by PT    Recommendations for Other Services       Precautions / Restrictions Precautions Precautions: Fall Restrictions Weight Bearing Restrictions: No      Mobility  Bed Mobility Overal bed mobility: Needs Assistance Bed Mobility: Supine to Sit     Supine to sit: Mod assist     General bed mobility comments: assist coming up and scooting to EOB  Transfers Overall transfer level: Needs assistance   Transfers: Sit to/from Stand;Stand Pivot Transfers Sit to Stand: Min assist;Mod assist Stand pivot transfers: Min assist;Mod assist       General transfer comment: depending on height of surface  Ambulation/Gait Ambulation/Gait assistance: Min assist Ambulation Distance (Feet): 140 Feet Assistive device: Rolling walker (2 wheeled) Gait Pattern/deviations: Step-through pattern   Gait velocity interpretation: Below normal speed for age/gender General Gait  Details: mildly unsteady throughout gait trial, but managed well with a RW and minimal stability assist is spite of severe arthritis.  Stairs            Wheelchair Mobility    Modified Rankin (Stroke Patients Only)       Balance Overall balance assessment: Needs assistance Sitting-balance support: No upper extremity supported;Bilateral upper extremity supported Sitting balance-Leahy Scale: Fair     Standing balance support: Bilateral upper extremity supported Standing balance-Leahy Scale: Poor Standing balance comment: reliant on RW or external support                             Pertinent Vitals/Pain Pain Assessment: Faces Faces Pain Scale: Hurts whole lot Pain Location: all joints Pain Descriptors / Indicators: Burning;Shooting Pain Intervention(s): Limited activity within patient's tolerance;Monitored during session;Repositioned    Home Living Family/patient expects to be discharged to:: Private residence Living Arrangements: Spouse/significant other Available Help at Discharge: Family Type of Home: House Home Access: Stairs to enter Entrance Stairs-Rails: Right;Left;Can reach both Technical brewer of Steps: deck-4  Home Layout: Multi-level;Able to live on main level with bedroom/bathroom Home Equipment: Gilford Rile - 2 wheels;Cane - single point;Bedside commode      Prior Function Level of Independence: Needs assistance   Gait / Transfers Assistance Needed: use RW to/from the bathroom  ADL's / Homemaking Assistance Needed: Pt reports that she has been sponge bathing for the past 4 months or so        Hand Dominance   Dominant Hand: Right    Extremity/Trunk  Assessment   Upper Extremity Assessment Upper Extremity Assessment: Generalized weakness    Lower Extremity Assessment Lower Extremity Assessment: Overall WFL for tasks assessed;Generalized weakness (weak with movement across a range, but weight bearing fxn.)       Communication    Communication: HOH  Cognition Arousal/Alertness: Awake/alert Behavior During Therapy: Anxious (grumpy) Overall Cognitive Status: History of cognitive impairments - at baseline                 General Comments: I can't remember things from 1 minute to the next    General Comments      Exercises     Assessment/Plan    PT Assessment Patient needs continued PT services  PT Problem List Decreased strength;Decreased activity tolerance;Decreased range of motion;Decreased balance;Decreased mobility;Decreased knowledge of use of DME;Pain          PT Treatment Interventions Gait training;Functional mobility training;Therapeutic activities;Stair training;DME instruction;Patient/family education    PT Goals (Current goals can be found in the Care Plan section)  Acute Rehab PT Goals Patient Stated Goal: I want to be able to go home PT Goal Formulation: With patient Time For Goal Achievement: 04/14/16 Potential to Achieve Goals: Fair    Frequency Min 3X/week   Barriers to discharge        Co-evaluation               End of Session   Activity Tolerance: Patient tolerated treatment well Patient left: in chair;with chair alarm set;with call bell/phone within reach Nurse Communication: Mobility status    Functional Assessment Tool Used: clinical judgement Functional Limitation: Mobility: Walking and moving around Mobility: Walking and Moving Around Current Status [id number]): At least 20 percent but less than 40 percent impaired, limited or restricted Mobility: Walking and Moving Around Goal Status [phone]): At least 1 percent but less than 20 percent impaired, limited or restricted    Time: 1701-1753 PT Time Calculation (min) (ACUTE ONLY): 52 min   Charges:   PT Evaluation $PT Eval Moderate Complexity: 1 Procedure PT Treatments $Gait Training: 8-22 mins $Therapeutic Activity: 8-22 mins   PT G Codes:   PT G-Codes **NOT FOR INPATIENT CLASS** Functional Assessment  Tool Used: clinical judgement Functional Limitation: Mobility: Walking and moving around Mobility: Walking and Moving Around Current Status [id number]): At least 20 percent but less than 40 percent impaired, limited or restricted Mobility: Walking and Moving Around Goal Status [phone]): At least 1 percent but less than 20 percent impaired, limited or restricted    Burnard Bunting 03/31/2016, 6:06 PM  03/31/2016  Donnella Sham, [place] [phone]  (pager)

## 2016-04-01 DIAGNOSIS — I509 Heart failure, unspecified: Secondary | ICD-10-CM

## 2016-04-01 DIAGNOSIS — S0003XA Contusion of scalp, initial encounter: Secondary | ICD-10-CM | POA: Diagnosis not present

## 2016-04-01 DIAGNOSIS — W1800XA Striking against unspecified object with subsequent fall, initial encounter: Secondary | ICD-10-CM

## 2016-04-01 DIAGNOSIS — I482 Chronic atrial fibrillation: Secondary | ICD-10-CM

## 2016-04-01 LAB — BASIC METABOLIC PANEL
ANION GAP: 6 (ref 5–15)
BUN: 32 mg/dL — ABNORMAL HIGH (ref 6–20)
CALCIUM: 8 mg/dL — AB (ref 8.9–10.3)
CHLORIDE: 112 mmol/L — AB (ref 101–111)
CO2: 22 mmol/L (ref 22–32)
CREATININE: 1.16 mg/dL — AB (ref 0.44–1.00)
GFR calc non Af Amer: 44 mL/min — ABNORMAL LOW (ref >60)
GFR, EST AFRICAN AMERICAN: 51 mL/min — AB (ref >60)
Glucose, Bld: 87 mg/dL (ref 65–99)
Potassium: 4 mmol/L (ref 3.5–5.1)
SODIUM: 140 mmol/L (ref 135–145)

## 2016-04-01 LAB — TYPE AND SCREEN
ABO/RH(D): B POS
ANTIBODY SCREEN: NEGATIVE
UNIT DIVISION: 0

## 2016-04-01 LAB — CBC
HCT: 27.2 % — ABNORMAL LOW (ref 36.0–46.0)
HEMOGLOBIN: 8.4 g/dL — AB (ref 12.0–15.0)
MCH: 26.2 pg (ref 26.0–34.0)
MCHC: 30.9 g/dL (ref 30.0–36.0)
MCV: 84.7 fL (ref 78.0–100.0)
Platelets: 241 10*3/uL (ref 150–400)
RBC: 3.21 MIL/uL — ABNORMAL LOW (ref 3.87–5.11)
RDW: 18.3 % — ABNORMAL HIGH (ref 11.5–15.5)
WBC: 8.7 10*3/uL (ref 4.0–10.5)

## 2016-04-01 LAB — GLUCOSE, CAPILLARY: GLUCOSE-CAPILLARY: 104 mg/dL — AB (ref 65–99)

## 2016-04-01 LAB — HEMOGLOBIN AND HEMATOCRIT, BLOOD
HEMATOCRIT: 28.5 % — AB (ref 36.0–46.0)
HEMOGLOBIN: 8.9 g/dL — AB (ref 12.0–15.0)

## 2016-04-01 MED ORDER — DOXYCYCLINE HYCLATE 100 MG PO CAPS: 100.0000 mg | ORAL_CAPSULE | Freq: Two times a day (BID) | ORAL | 0 refills | Status: DC

## 2016-04-01 NOTE — Discharge Summary (Signed)
[name] [id number] DOB: August 18, 1934 DOA: 03/30/2016  PCP: Hollace Kinnier, DO  Admit date: 03/30/2016  Discharge date: 04/01/2016  Admitted From: Home   Disposition:  Home   Recommendations for Outpatient Follow-up:   Follow up with PCP in 1-2 weeks  PCP Please obtain BMP/CBC, 2 view CXR in 1week,  (see Discharge instructions)   PCP Please follow up on the following pending results: None   Home Health: PT,RN,Aide   Equipment/Devices: None  Consultations: None Discharge Condition: Stable   CODE STATUS: Full   Diet Recommendation:  Heart Healthy    Chief Complaint  Patient presents with  . Fall     Brief history of present illness from the day of admission and additional interim summary    Patient is a 82 y.o. female with a PMH significant for atrial fibrillation, CHF, hypothyroidism, RA, and UC who presented to ED on 03/30/16 after sustaining a mechanical fall with a left parietal laceration-requiring suturing. In the ED she was found to have Afib with RVR. She was admitted for further evaluation and treatment. See below for further details.   Hospital issues addressed    Mechanical Fall with Left parietal scalp hematoma:CT head with no acute intracranial abnormalities-does show a left parietal scalp hematoma. CT C spine-no fracture. Patient is AOX3 today.Staples place in the ED-per ED note-need to be removed after 8 days, I have requested her to follow with PCP in a week.Last Tdap shots were 03/30/16 and 08/16/14. Continue supportive care , Aide by PT and home health PT ordered upon discharge.     Diarrhea: Resolved-per RN one soft stool today.   Chronic diastolic CHF (congestive heart failure), NYHA class 1: EF 60%. Compensated this admission . Continue furosemide 20 mg po.    Chronic A. fib  Atrial fibrillation : RVR was on admission, but spontaneously converted back to sinus rhythm. Continue Metoprolol.CHA2DS2-VASc is 6-on Eliquis.   Chronic venous stasis dermatitis of both lower extremities with superimposed Cellulitis: Probable etiology chronic venous hypertension. B/L una boot are in place. Please see wound care results for detail, was treated with vancomycin for suspected cellulitis, clinically much better and close to baseline place on 7 days of oral doxycycline and discharged.  Acute on Chronic anemia:  due to blood loss from scalp laceration, received 1 unit of packed RBC and H&H now stable. No abdominal pain, no other bruises. No frank blood or melena in stools.  AKI: probably mild pre-renal azotemia due to blood could urine output, creatinine stable, PCP to repeat BMP in a week.  Benign essential [id number]- Continue metoprolol tartrate.   Rheumatoid arthritis : Stable during this hospitalization. Continue prednisone & Celecoxib for now. Continue pain management as per home regimen and follow with PCP and rheumatologist outpatient.   Ulcerative colitis, chronic: Currently denies any diarrhea or abdominal discomfit. Continue to monitor daily BM status. Continue balsalazide and prednisone. With PCP and GI outpatient as before unchanged.   Hypothyroidism:  Continue levothyroxine.       Discharge diagnosis  Principal Problem:   Fall against object Active Problems:   Rheumatoid arthritis ([facility])   Ulcerative colitis, chronic (HCC)   Hypothyroidism   Chronic venous stasis dermatitis of both lower extremities   Benign essential HTN   Acute on chronic diastolic CHF (congestive heart failure), NYHA class 1 (HCC)   Cellulitis   Atrial fibrillation (HCC) [I48.91]   Diarrhea   Weakness    Discharge instructions    Discharge Instructions    Diet - low sodium heart healthy    Complete by:  As directed    Discharge instructions    Complete by:  As  directed    Follow with Primary MD [name], [name], DO in 7 days   Get CBC, CMP, 2 view Chest X ray checked  by Primary MD or SNF MD in 5-7 days ( we routinely change or add medications that can affect your baseline labs and fluid status, therefore we recommend that you get the mentioned basic workup next visit with your PCP, your PCP may decide not to get them or add new tests based on their clinical decision)   Activity: As tolerated with Full fall precautions use walker/cane & assistance as needed   Disposition Home     Diet:    Heart Healthy   For Heart failure patients - Check your Weight same time everyday, if you gain over 2 pounds, or you develop in leg swelling, experience more shortness of breath or chest pain, call your Primary MD immediately. Follow Cardiac Low Salt Diet and 1.5 lit/day fluid restriction.   On your next visit with your primary care physician please Get Medicines reviewed and adjusted.   Please request your Prim.MD to go over all Hospital Tests and Procedure/Radiological results at the follow up, please get all Hospital records sent to your Prim MD by signing hospital release before you go home.   If you experience worsening of your admission symptoms, develop shortness of breath, life threatening emergency, suicidal or homicidal thoughts you must seek medical attention immediately by calling 911 or calling your MD immediately  if symptoms less severe.  You Must read complete instructions/literature along with all the possible adverse reactions/side effects for all the Medicines you take and that have been prescribed to you. Take any new Medicines after you have completely understood and accpet all the possible adverse reactions/side effects.   Do not drive, operate heavy machinery, perform activities at heights, swimming or participation in water activities or provide baby sitting services if your were admitted for syncope or siezures until you have seen by  Primary MD or a Neurologist and advised to do so again.  Do not drive when taking Pain medications.    Do not take more than prescribed Pain, Sleep and Anxiety Medications  Special Instructions: If you have smoked or chewed Tobacco  in the last 2 yrs please stop smoking, stop any regular Alcohol  and or any Recreational drug use.  Wear Seat belts while driving.   Please note  You were cared for by a hospitalist during your hospital stay. If you have any questions about your discharge medications or the care you received while you were in the hospital after you are discharged, you can call the unit and asked to speak with the hospitalist on call if the hospitalist that took care of you is not available. Once you are discharged, your primary care physician will handle any further medical issues. Please note that NO REFILLS for any discharge medications will  be authorized once you are discharged, as it is imperative that you return to your primary care physician (or establish a relationship with a primary care physician if you do not have one) for your aftercare needs so that they can reassess your need for medications and monitor your lab values.   Increase activity slowly    Complete by:  As directed       Discharge Medications   Allergies as of 04/01/2016   No Known Allergies     Medication List    TAKE these medications   apixaban 5 MG Tabs tablet Commonly known as:  ELIQUIS Take 1 tablet (5 mg total) by mouth 2 (two) times daily.   balsalazide 750 MG capsule Commonly known as:  COLAZAL TAKE 3 CAPSULES TWICE DAILY   bismuth subsalicylate [id number] suspension Commonly known as:  PEPTO BISMOL Take 15 mLs by mouth 4 (four) times daily -  before meals and at bedtime.   Calcium + D3 600-200 MG-UNIT Tabs Take 1 tablet by mouth daily with breakfast.   cefUROXime 500 MG tablet Commonly known as:  CEFTIN Take 1 tablet (500 mg total) by mouth daily.   celecoxib 200 MG  capsule Commonly known as:  CELEBREX Take 1 capsule (200 mg total) by mouth 2 (two) times daily.   collagenase ointment Commonly known as:  SANTYL Apply 1 application topically daily. For wound What changed:  when to take this  reasons to take this  additional instructions   doxycycline 100 MG capsule Commonly known as:  VIBRAMYCIN Take 1 capsule (100 mg total) by mouth 2 (two) times daily.   estradiol 0.5 MG tablet Commonly known as:  ESTRACE Take 0.5 mg by mouth daily.   ferrous sulfate 325 (65 FE) MG tablet Take 325 mg by mouth 2 (two) times daily with a meal.   folic acid 1 MG tablet Commonly known as:  FOLVITE Take 1 mg by mouth daily.   furosemide 40 MG tablet Commonly known as:  LASIX Take 1 tablet (40 mg total) by mouth daily. What changed:  how much to take  when to take this  reasons to take this   KLOR-CON M20 20 MEQ tablet Generic drug:  potassium chloride SA TAKE 2 TABLETS BY MOUTH DAILY. What changed:  how much to take   latanoprost 0.005 % ophthalmic solution Commonly known as:  XALATAN Place 1 drop into both eyes at bedtime.   levothyroxine 75 MCG tablet Commonly known as:  SYNTHROID, LEVOTHROID Take 1 tablet (75 mcg total) by mouth daily.   medroxyPROGESTERone 2.5 MG tablet Commonly known as:  PROVERA Take 1 tablet (2.5 mg total) by mouth at bedtime.   metoprolol tartrate 25 MG tablet Commonly known as:  LOPRESSOR Take 1 tablet (25 mg total) by mouth 2 (two) times daily.   multivitamin with minerals Tabs tablet Take 1 tablet by mouth daily with lunch.   Oxycodone HCl 10 MG Tabs Take 1 tablet (10 mg total) by mouth 4 (four) times daily as needed (severe pain). What changed:  when to take this   polyethylene glycol packet Commonly known as:  MIRALAX / GLYCOLAX Take 17 g by mouth daily as needed for mild constipation.   predniSONE 1 MG tablet Commonly known as:  DELTASONE Take 4 tablets (4 mg total) by mouth daily.    spironolactone 25 MG tablet Commonly known as:  ALDACTONE TAKE 1 TABLET MONDAY, WEDNESDAY, FRIDAY ONLY       Follow-up Information  [name], [name], DO. Schedule an appointment as soon as possible for a visit in 1 week(s).   Specialty:  Geriatric Medicine Contact information: [address]. [place] [address] [phone]           Major procedures and Radiology Reports - PLEASE review detailed and final reports thoroughly  -         Dg Chest 2 View  Result Date: 03/30/2016 CLINICAL DATA:  Fever. EXAM: CHEST  2 VIEW COMPARISON:  02/23/2015 FINDINGS: Borderline cardiomegaly. Stable mediastinal contours. Aortic atherosclerosis. Calcified granuloma over the right chest, stable over multiple exams. There is no edema, consolidation, effusion, or pneumothorax. Chronic bilateral rotator cuff tears. Question chronic Hill-Sachs deformity on the left. Chronic fragmentation of the lateral left clavicle. IMPRESSION: Negative for pneumonia or other acute finding. Electronically Signed   By: Monte Fantasia M.D.   On: 03/30/2016 09:59   Ct Head Wo Contrast  Result Date: 03/30/2016 CLINICAL DATA:  Pain following fall EXAM: CT HEAD WITHOUT CONTRAST CT CERVICAL SPINE WITHOUT CONTRAST TECHNIQUE: Multidetector CT imaging of the head and cervical spine was performed following the standard protocol without intravenous contrast. Multiplanar CT image reconstructions of the cervical spine were also generated. COMPARISON:  Cervical spine CT myelogram June 30, 2013; head CT October 09, 2015 FINDINGS: CT HEAD FINDINGS Brain: Moderate diffuse atrophy is stable. There is a stable calcified meningioma along the dura of the right frontal region measuring 2.0 x 0.7 cm. There is no surrounding edema. There is a 6 mm right parasagittal calcified meningioma without surrounding edema as well, stable. There is no new mass. There is no hemorrhage, extra-axial fluid collection, or midline shift. There is patchy small  vessel disease in the centra semiovale bilaterally. Elsewhere gray-white compartments appear unremarkable. No evident acute infarct. Vascular: There is no hyperdense vessel. There is extensive calcification in both carotid siphon regions. Skull: There is a sizable left parietal scalp hematoma. No fracture evident. The bony calvarium appears intact. Sinuses/Orbits: There is mucosal thickening in several ethmoid air cells bilaterally, more on the right than on the left. Paranasal sinuses which are visualized elsewhere appear clear. Orbits appear symmetric bilaterally. Other: Mastoid air cells are clear. CT CERVICAL SPINE FINDINGS Alignment: There is cervical dextroscoliosis. There is 4 mm of anterolisthesis of C4 on C5. There is 3 mm of anterolisthesis of C7 on T1. No other spondylolisthesis evident. Skull base and vertebrae: The skull base and craniocervical junction regions appear normal. The patient is status post anterior screw and plate fixation at C4 and C5 with disc spacer at C4-5. Support hardware intact. There is no demonstrable acute fracture. There are no blastic or lytic bone lesions. Soft tissues and spinal canal: Prevertebral soft tissues and predental space regions are normal. There is no cord or canal hematoma seen. There is no high-grade spinal stenosis. Disc levels: There is marked disc space narrowing at C5-6, C6-7, and C7-T1. There is moderate disc space narrowing at C2-3. Disc spacer noted at C4-5. There is multilevel osteoarthritic change with exit foraminal narrowing due to bony hypertrophy at multiple levels. Exit foraminal narrowing is most severe on the left at C5-6 where there is impression on the exiting nerve root. There is no frank disc extrusion evident. Upper chest: Visualized lung apices are clear. There is aortic atherosclerosis. Other: There is calcification in the right subclavian artery. There are foci of coronary artery calcification bilaterally. There are nodular lesions in the  thyroid consistent with multinodular goiter. IMPRESSION: CT head: Sizable left  parietal scalp hematoma. No fracture. Stable atrophy with periventricular small vessel disease. Stable calcified meningiomas in the right frontal region without surrounding edema. No intracranial mass, hemorrhage, or extra-axial fluid collection. No acute appearing infarct. There are foci of arterial vascular calcification. There is ethmoid sinus disease, more notable on the right than on the left. CT cervical spine: No acute fracture. Spondylolisthesis at C4-5 and C7-T1, likely due to underlying spondylolisthesis. Note that there is postoperative fixation anteriorly at C4 and C5. There is multilevel arthropathy. Note that there is marked exit foraminal narrowing on the left at C5-6 with impression on the exit foramen at this level. Aortic atherosclerosis as well as calcification in several arterial vessels including both carotid arteries. Multinodular goiter. Electronically Signed   By: Lowella Grip III M.D.   On: 03/30/2016 09:49   Ct Cervical Spine Wo Contrast  Result Date: 03/30/2016 CLINICAL DATA:  Pain following fall EXAM: CT HEAD WITHOUT CONTRAST CT CERVICAL SPINE WITHOUT CONTRAST TECHNIQUE: Multidetector CT imaging of the head and cervical spine was performed following the standard protocol without intravenous contrast. Multiplanar CT image reconstructions of the cervical spine were also generated. COMPARISON:  Cervical spine CT myelogram June 30, 2013; head CT October 09, 2015 FINDINGS: CT HEAD FINDINGS Brain: Moderate diffuse atrophy is stable. There is a stable calcified meningioma along the dura of the right frontal region measuring 2.0 x 0.7 cm. There is no surrounding edema. There is a 6 mm right parasagittal calcified meningioma without surrounding edema as well, stable. There is no new mass. There is no hemorrhage, extra-axial fluid collection, or midline shift. There is patchy small vessel disease in the centra  semiovale bilaterally. Elsewhere gray-white compartments appear unremarkable. No evident acute infarct. Vascular: There is no hyperdense vessel. There is extensive calcification in both carotid siphon regions. Skull: There is a sizable left parietal scalp hematoma. No fracture evident. The bony calvarium appears intact. Sinuses/Orbits: There is mucosal thickening in several ethmoid air cells bilaterally, more on the right than on the left. Paranasal sinuses which are visualized elsewhere appear clear. Orbits appear symmetric bilaterally. Other: Mastoid air cells are clear. CT CERVICAL SPINE FINDINGS Alignment: There is cervical dextroscoliosis. There is 4 mm of anterolisthesis of C4 on C5. There is 3 mm of anterolisthesis of C7 on T1. No other spondylolisthesis evident. Skull base and vertebrae: The skull base and craniocervical junction regions appear normal. The patient is status post anterior screw and plate fixation at C4 and C5 with disc spacer at C4-5. Support hardware intact. There is no demonstrable acute fracture. There are no blastic or lytic bone lesions. Soft tissues and spinal canal: Prevertebral soft tissues and predental space regions are normal. There is no cord or canal hematoma seen. There is no high-grade spinal stenosis. Disc levels: There is marked disc space narrowing at C5-6, C6-7, and C7-T1. There is moderate disc space narrowing at C2-3. Disc spacer noted at C4-5. There is multilevel osteoarthritic change with exit foraminal narrowing due to bony hypertrophy at multiple levels. Exit foraminal narrowing is most severe on the left at C5-6 where there is impression on the exiting nerve root. There is no frank disc extrusion evident. Upper chest: Visualized lung apices are clear. There is aortic atherosclerosis. Other: There is calcification in the right subclavian artery. There are foci of coronary artery calcification bilaterally. There are nodular lesions in the thyroid consistent with  multinodular goiter. IMPRESSION: CT head: Sizable left parietal scalp hematoma. No fracture. Stable atrophy with periventricular small vessel  disease. Stable calcified meningiomas in the right frontal region without surrounding edema. No intracranial mass, hemorrhage, or extra-axial fluid collection. No acute appearing infarct. There are foci of arterial vascular calcification. There is ethmoid sinus disease, more notable on the right than on the left. CT cervical spine: No acute fracture. Spondylolisthesis at C4-5 and C7-T1, likely due to underlying spondylolisthesis. Note that there is postoperative fixation anteriorly at C4 and C5. There is multilevel arthropathy. Note that there is marked exit foraminal narrowing on the left at C5-6 with impression on the exit foramen at this level. Aortic atherosclerosis as well as calcification in several arterial vessels including both carotid arteries. Multinodular goiter. Electronically Signed   By: Lowella Grip III M.D.   On: 03/30/2016 09:49    Micro Results     Recent Results (from the past 240 hour(s))  Blood Culture (routine x 2)     Status: None (Preliminary result)   Collection Time: 03/30/16  8:05 AM  Result Value Ref Range Status   Specimen Description BLOOD LEFT ANTECUBITAL  Final   Special Requests BOTTLES DRAWN AEROBIC AND ANAEROBIC  5CC  Final   Culture NO GROWTH 1 DAY  Final   Report Status PENDING  Incomplete  Blood Culture (routine x 2)     Status: None (Preliminary result)   Collection Time: 03/30/16  8:05 AM  Result Value Ref Range Status   Specimen Description BLOOD RIGHT ANTECUBITAL  Final   Special Requests BOTTLES DRAWN AEROBIC AND ANAEROBIC  5CC  Final   Culture NO GROWTH 1 DAY  Final   Report Status PENDING  Incomplete  Urine culture     Status: None   Collection Time: 03/30/16  8:23 AM  Result Value Ref Range Status   Specimen Description URINE, CATHETERIZED  Final   Special Requests NONE  Final   Culture NO GROWTH   Final   Report Status 03/31/2016 FINAL  Final    Today   Subjective    Vallery Sa today has no headache,no chest abdominal pain,no new weakness tingling or numbness, feels much better wants to go home today.     Objective   Blood pressure (!) 125/50, pulse (!) 58, temperature 98 F (36.7 C), temperature source Oral, resp. rate 18, height 5\' 9"  (1.753 m), weight 59.6 kg (131 lb 6.3 oz), SpO2 99 %.   Intake/Output Summary (Last 24 hours) at 04/01/16 1041 Last data filed at 04/01/16 1000  Gross per 24 hour  Intake              915 ml  Output              211 ml  Net              704 ml    Exam Awake Alert, Oriented x 3, No new F.N deficits, Normal affect [place].AT,PERRAL Supple Neck,No JVD, No cervical lymphadenopathy appriciated.  Symmetrical Chest wall movement, Good air movement bilaterally, CTAB RRR,No Gallops,Rubs or new Murmurs, No Parasternal Heave +ve B.Sounds, Abd Soft, Non tender, No organomegaly appriciated, No rebound -guarding or rigidity. No Cyanosis, Clubbing or edema, No new Rash or bruise,Both legs are wrapped in Unna boots   Data Review   CBC w Diff:  Lab Results  Component Value Date   WBC 8.7 04/01/2016   HGB 8.9 (L) 04/01/2016   HCT 28.5 (L) 04/01/2016   HCT 34.4 09/03/2015   PLT 241 04/01/2016   PLT 380 (H) 09/03/2015   LYMPHOPCT 11 03/30/2016  MONOPCT 7 03/30/2016   EOSPCT 1 03/30/2016   BASOPCT 0 03/30/2016    CMP:  Lab Results  Component Value Date   NA 140 04/01/2016   NA 137 09/03/2015   K 4.0 04/01/2016   CL 112 (H) 04/01/2016   CO2 22 04/01/2016   BUN 32 (H) 04/01/2016   BUN 33 (H) 09/03/2015   CREATININE 1.16 (H) 04/01/2016   CREATININE 1.07 (H) 09/22/2015   PROT 6.3 (L) 03/30/2016   PROT 5.9 (L) 12/25/2014   ALBUMIN 3.1 (L) 03/30/2016   ALBUMIN 4.0 12/25/2014   BILITOT 0.4 03/30/2016   BILITOT 0.4 12/25/2014   ALKPHOS 122 03/30/2016   AST 30 03/30/2016   ALT 15 03/30/2016  .   Total Time in preparing paper work,  data evaluation and todays exam - 35 minutes  Thurnell Lose M.D on 04/01/2016 at 10:41 AM  [facility]   Office  [phone]

## 2016-04-01 NOTE — Care Management Note (Signed)
Case Management Note  Patient Details  Name: [name] MRN: [id number] Date of Birth: Sep 28, 1934  Subjective/Objective:                  sustaining a mechanical fall Action/Plan: Discharge planning Expected Discharge Date:  04/01/16               Expected Discharge Plan:  [place]  In-House Referral:  NA  Discharge planning Services  CM Consult  Post Acute Care Choice:  Home Health Choice offered to:  Patient  DME Arranged:  N/A DME Agency:  NA  HH Arranged:  RN, PT, OT, Nurse's Aide, Social Work [employer] Agency:  [facility]  Status of Service:  Completed, signed off  If discussed at [employer], dates discussed:    Additional Comments: CM met with pt in room to offer choice of home health agency. Pt chooses [facility] to render HHPT/OT/RN/Aide/SW(for community resources, CAPS, Meels on Wheels, possible SNF placement).  Pt states understanding she may not be able to continue at home and wishes for CSW to look into SNF placement if home does not work out.  CM has placed [facility] consult as pt is [employer]. CM has given pt Private Duty Agency List (which pt verbalized understanding this is an out of pocket expense).  CM arranging [facility] transport home. No other CM needs were communicated. Dellie Catholic, RN 04/01/2016, 12:10 PM

## 2016-04-01 NOTE — Progress Notes (Signed)
Patient Discharge: Disposition: Patient discharged to home. Education: Reviewed medications, prescriptions, follow-up appointments and discharge instructions, verbalized understanding. IV: Discontinued IV before discharge. Telemetry: Discontinued Tele before discharge, CCMD notified. Transportation: Patient transported via [facility]. Belongings: Patient took all her belongings with her.

## 2016-04-01 NOTE — Discharge Instructions (Signed)
Follow with Primary MD [name], [name], DO in 7 days   Get CBC, CMP, 2 view Chest X ray checked  by Primary MD or SNF MD in 5-7 days ( we routinely change or add medications that can affect your baseline labs and fluid status, therefore we recommend that you get the mentioned basic workup next visit with your PCP, your PCP may decide not to get them or add new tests based on their clinical decision)   Activity: As tolerated with Full fall precautions use walker/cane & assistance as needed   Disposition Home     Diet:    Heart Healthy   For Heart failure patients - Check your Weight same time everyday, if you gain over 2 pounds, or you develop in leg swelling, experience more shortness of breath or chest pain, call your Primary MD immediately. Follow Cardiac Low Salt Diet and 1.5 lit/day fluid restriction.   On your next visit with your primary care physician please Get Medicines reviewed and adjusted.   Please request your Prim.MD to go over all Hospital Tests and Procedure/Radiological results at the follow up, please get all Hospital records sent to your Prim MD by signing hospital release before you go home.   If you experience worsening of your admission symptoms, develop shortness of breath, life threatening emergency, suicidal or homicidal thoughts you must seek medical attention immediately by calling 911 or calling your MD immediately  if symptoms less severe.  You Must read complete instructions/literature along with all the possible adverse reactions/side effects for all the Medicines you take and that have been prescribed to you. Take any new Medicines after you have completely understood and accpet all the possible adverse reactions/side effects.   Do not drive, operate heavy machinery, perform activities at heights, swimming or participation in water activities or provide baby sitting services if your were admitted for syncope or siezures until you have seen by Primary MD or a  Neurologist and advised to do so again.  Do not drive when taking Pain medications.    Do not take more than prescribed Pain, Sleep and Anxiety Medications  Special Instructions: If you have smoked or chewed Tobacco  in the last 2 yrs please stop smoking, stop any regular Alcohol  and or any Recreational drug use.  Wear Seat belts while driving.   Please note  You were cared for by a hospitalist during your hospital stay. If you have any questions about your discharge medications or the care you received while you were in the hospital after you are discharged, you can call the unit and asked to speak with the hospitalist on call if the hospitalist that took care of you is not available. Once you are discharged, your primary care physician will handle any further medical issues. Please note that NO REFILLS for any discharge medications will be authorized once you are discharged, as it is imperative that you return to your primary care physician (or establish a relationship with a primary care physician if you do not have one) for your aftercare needs so that they can reassess your need for medications and monitor your lab values.

## 2016-04-03 ENCOUNTER — Telehealth: Payer: Self-pay

## 2016-04-03 NOTE — Telephone Encounter (Signed)
Transition Care Management Follow-Up Telephone Call   Date discharged and where: [facility]; 04/01/16 How have you been since you were released from the hospital?  Doing well since getting home; dizzy if she gets up too fast;   Any patient concerns? Pt states she can no longer take care of herself and her husband can no longer take care of her either. They are trying to get the patient into [facility]. [facility] has been called in due to this recent fall, they have not been to the house yet.   Items Reviewed:   Meds: Y  Allergies: Y  Dietary Changes Reviewed: Y  Functional Questionnaire:  Independent-I Dependent-D  ADLs:   Dressing- D    Eating- I   Maintaining continence- I   Transferring- D   Transportation- D   Meal Prep- D   Managing Meds- D  Confirmed importance and Date/Time of follow-up visits scheduled: 04/10/16 @ 2:30 with Dr. Mariea Clonts   Confirmed with patient if condition worsens to call PCP or go to the Emergency Dept. Patient was given office number and encouraged to call back with questions or concerns: Darreld Mclean

## 2016-04-04 ENCOUNTER — Telehealth: Payer: Self-pay | Admitting: *Deleted

## 2016-04-04 LAB — CULTURE, BLOOD (ROUTINE X 2)
CULTURE: NO GROWTH
CULTURE: NO GROWTH

## 2016-04-04 NOTE — Telephone Encounter (Signed)
Not sure why the hospital ordered a different home health agency for her at discharge.  I have not even seen her yet and she comes next week to see me.  Trouble is that I didn't give the original new order  [facility] and ask them about the situation to make sure insurance approves also?

## 2016-04-04 NOTE — Telephone Encounter (Signed)
Patient called and stated that she was just released from hospital. Stated that [facility], Theresa Duty came out to home and he was very knowledgeable and helpful but he cannot continue because [facility] already comes to home. Patient wants to switch to [facility] and drop [facility] but patient states it has to come from PCP. Please Advise.

## 2016-04-07 NOTE — Telephone Encounter (Signed)
Ok, we can address it at her appt Monday if she still wants to make that change at that time.

## 2016-04-07 NOTE — Telephone Encounter (Signed)
Called [facility] [phone] and spoke with Lytle Michaels and he stated that we have to contact [facility] to discharge patient from their services and then send a referral to [facility] to start services.

## 2016-04-10 ENCOUNTER — Ambulatory Visit (INDEPENDENT_AMBULATORY_CARE_PROVIDER_SITE_OTHER): Payer: Medicare Other | Admitting: Internal Medicine

## 2016-04-10 ENCOUNTER — Encounter: Payer: Self-pay | Admitting: Internal Medicine

## 2016-04-10 VITALS — BP 120/80 | HR 60 | Temp 98.5°F | Wt 131.0 lb

## 2016-04-10 DIAGNOSIS — L97929 Non-pressure chronic ulcer of unspecified part of left lower leg with unspecified severity: Secondary | ICD-10-CM

## 2016-04-10 DIAGNOSIS — R531 Weakness: Secondary | ICD-10-CM | POA: Diagnosis not present

## 2016-04-10 DIAGNOSIS — E43 Unspecified severe protein-calorie malnutrition: Secondary | ICD-10-CM | POA: Diagnosis not present

## 2016-04-10 DIAGNOSIS — I872 Venous insufficiency (chronic) (peripheral): Secondary | ICD-10-CM

## 2016-04-10 DIAGNOSIS — F0390 Unspecified dementia without behavioral disturbance: Secondary | ICD-10-CM

## 2016-04-10 DIAGNOSIS — I83029 Varicose veins of left lower extremity with ulcer of unspecified site: Secondary | ICD-10-CM

## 2016-04-10 DIAGNOSIS — S0101XA Laceration without foreign body of scalp, initial encounter: Secondary | ICD-10-CM

## 2016-04-10 MED ORDER — OXYCODONE HCL 10 MG PO TABS: 10.0000 mg | ORAL_TABLET | Freq: Four times a day (QID) | ORAL | 0 refills | Status: DC | PRN

## 2016-04-10 NOTE — Progress Notes (Addendum)
Location:  [facility] clinic Provider:  Corona Popovich L. Mariea Clonts, D.O., C.M.D.  Code Status: DNR Goals of Care:  Advanced Directives 03/30/2016  Does Patient Have a Medical Advance Directive? Yes  Type of Advance Directive Out of facility DNR (pink MOST or yellow form)  Does patient want to make changes to medical advance directive? No - Patient declined  Copy of [place] in Chart? -  Would patient like information on creating a medical advance directive? -  Pre-existing out of facility DNR order (yellow form or pink MOST form) Physician notified to receive inpatient order     Chief Complaint  Patient presents with  . Medical Management of Chronic Issues    2 mth follow-up  . Suture / Staple Removal    HPI: Patient is a 82 y.o. [name] seen today for medical management of chronic diseases, f/u after hospitalization for fall and staple removal from her scalp.    Jessica, wound care nurse from [facility], had noted enlargement and worsening of her venous ulcer on her left lateral leg.  Pt refused to let her wrap her right leg due to no swelling at that time.  We agreed to use santyl on granulation tissue in wound and continue wraps but twice a week visits.  Pt continues to refuse wound care center.    Past Medical History:  Diagnosis Date  . Anemia   . Anxiety    sometimes   . Bruise    right lower extremity with opsite on  . Calcified cerebral meningioma ([place])   . Chronic back pain    "my entire spine"  . Chronic bronchitis ([place]) 10/31/2011   "prone to it; I have sinus/bronchitis problem probably q yr"  . Complication of anesthesia 2009   htn with block- for hand surgery , pt thinks anesthesia causing some long term confusion  . Depression   . Diarrhea 01/27/2015  . Dizziness   . Dysrhythmia    pt states bring off coumadin for paroxysmal atrial fibrillation for last month  . Edema   . Encounter for long-term (current) use of other medications   . Falls   . H/O  clavicle fracture    left  . Hard of hearing   . History of blood transfusion   . Hypertension    pcp   dr Meigan Pates   [facility]  . Hypothyroidism   . Hypothyroidism   . Multiple skin tears 10/31/2011   "get them very easily; my skin is thin and I bruise easily"  . Other and unspecified hyperlipidemia   . Peripheral vascular disease ([place])   . Pneumonia 2008  . Rheumatoid arthritis(714.0)    degenerative disc disease , ra  . Senile osteoporosis   . Spinal stenosis, unspecified region other than cervical   . Superficial thrombophlebitis 04/20/2015  . Swelling of right upper extremity 11/26/2014  . Ulcerative colitis ([place])   . Unspecified glaucoma(365.9)     Past Surgical History:  Procedure Laterality Date  . ANTERIOR CERVICAL DECOMP/DISCECTOMY FUSION N/A 02/17/2014   Procedure: CERVICAL FOUR TO FIVE ANTERIOR CERVICAL DECOMPRESSION/DISCECTOMY FUSION 1 LEVEL;  Surgeon: [name], MD;  Location: [facility];  Service: Neurosurgery;  Laterality: N/A;  C4-5 Anterior cervical decompression/diskectomy/fusion  . BACK SURGERY    . CATARACT EXTRACTION Left 11/11/2013  . CATARACT EXTRACTION Right 12/30/2013  . EXCISIONAL TOTAL KNEE ARTHROPLASTY WITH ANTIBIOTIC SPACERS Left 03/01/2015   Procedure: RESECTION LEFT TOTAL KNEE ARTHROPLASTY WITH PLACEMENT OF  ANTIBIOTIC SPACERS;  Surgeon: Paralee Cancel, MD;  Location: [facility];  Service: Orthopedics;  Laterality: Left;  . EYE SURGERY Bilateral   . FUNCTIONAL ENDOSCOPIC SINUS SURGERY  1988  . I&D KNEE WITH POLY EXCHANGE Left 10/13/2014   Procedure: INCISION AND DRAINAGE LEFT TOTAL KNEE WITH POLY EXCHANGE;  Surgeon: Paralee Cancel, MD;  Location: [facility];  Service: Orthopedics;  Laterality: Left;  . JOINT REPLACEMENT  2009; 2006   joints 2 fingers,rt hand; joint left thumb  . POSTERIOR FUSION LUMBAR SPINE  10/31/2011   L2-3; 3-4  . POSTERIOR FUSION LUMBAR SPINE  11/2009   L3-4;  L4-5  . POSTERIOR LUMBAR FUSION N/A 08/05/13   T1, T2  . REIMPLANTATION OF  TOTAL KNEE Left 07/05/2015   Procedure: REIMPLANTATION OF LEFT TOTAL KNEE WITH REMOVAL OF ANTIBIOTIC SPACER;  Surgeon: Paralee Cancel, MD;  Location: [facility];  Service: Orthopedics;  Laterality: Left;  . SINUS SURGERY WITH INSTATRAK    . SPINE SURGERY    . THYROIDECTOMY, PARTIAL  1997  . TONSILLECTOMY     "when I was a small child"  . TOTAL KNEE ARTHROPLASTY Left 06/16/2014   Procedure: TOTAL KNEE ARTHROPLASTY;  Surgeon: Paralee Cancel, MD;  Location: [facility];  Service: Orthopedics;  Laterality: Left;    No Known Allergies  Allergies as of 04/10/2016   No Known Allergies     Medication List       Accurate as of 04/10/16  2:47 PM. Always use your most recent med list.          apixaban 5 MG Tabs tablet Commonly known as:  ELIQUIS Take 1 tablet (5 mg total) by mouth 2 (two) times daily.   balsalazide 750 MG capsule Commonly known as:  COLAZAL TAKE 3 CAPSULES TWICE DAILY   bismuth subsalicylate [id number] suspension Commonly known as:  PEPTO BISMOL Take 15 mLs by mouth 4 (four) times daily -  before meals and at bedtime.   Calcium + D3 600-200 MG-UNIT Tabs Take 1 tablet by mouth daily with breakfast.   cefUROXime 500 MG tablet Commonly known as:  CEFTIN Take 1 tablet (500 mg total) by mouth daily.   celecoxib 200 MG capsule Commonly known as:  CELEBREX Take 1 capsule (200 mg total) by mouth 2 (two) times daily.   doxycycline 100 MG capsule Commonly known as:  VIBRAMYCIN Take 1 capsule (100 mg total) by mouth 2 (two) times daily.   estradiol 0.5 MG tablet Commonly known as:  ESTRACE Take 0.5 mg by mouth daily.   ferrous sulfate 325 (65 FE) MG tablet Take 325 mg by mouth 2 (two) times daily with a meal.   folic acid 1 MG tablet Commonly known as:  FOLVITE Take 1 mg by mouth daily.   furosemide 40 MG tablet Commonly known as:  LASIX Take 1 tablet (40 mg total) by mouth daily.   KLOR-CON M20 20 MEQ tablet Generic drug:  potassium chloride SA TAKE 2 TABLETS BY MOUTH  DAILY.   latanoprost 0.005 % ophthalmic solution Commonly known as:  XALATAN Place 1 drop into both eyes at bedtime.   levothyroxine 75 MCG tablet Commonly known as:  SYNTHROID, LEVOTHROID Take 1 tablet (75 mcg total) by mouth daily.   medroxyPROGESTERone 2.5 MG tablet Commonly known as:  PROVERA Take 1 tablet (2.5 mg total) by mouth at bedtime.   metoprolol tartrate 25 MG tablet Commonly known as:  LOPRESSOR Take 1 tablet (25 mg total) by mouth 2 (two) times daily.   multivitamin with  minerals Tabs tablet Take 1 tablet by mouth daily with lunch.   Oxycodone HCl 10 MG Tabs Take 1 tablet (10 mg total) by mouth 4 (four) times daily as needed (severe pain).   polyethylene glycol packet Commonly known as:  MIRALAX / GLYCOLAX Take 17 g by mouth daily as needed for mild constipation.   predniSONE 1 MG tablet Commonly known as:  DELTASONE Take 4 tablets (4 mg total) by mouth daily.   spironolactone 25 MG tablet Commonly known as:  ALDACTONE TAKE 1 TABLET MONDAY, WEDNESDAY, FRIDAY ONLY       Review of Systems:  Review of Systems  Constitutional: Positive for malaise/fatigue. Negative for chills and fever.  HENT: Negative for congestion.   Respiratory: Negative for cough and shortness of breath.   Cardiovascular: Negative for chest pain and leg swelling.  Skin:       Left leg venous ulcer    Health Maintenance  Topic Date Due  . ZOSTAVAX  03/09/2017 (Originally 01/26/1997)  . PNA vac Low Risk Adult (2 of 2 - PPSV23) 03/09/2017 (Originally 03/27/2015)  . TETANUS/TDAP  03/30/2026  . INFLUENZA VACCINE  Completed  . DEXA SCAN  Completed    Physical Exam: Vitals:   04/10/16 1434  BP: 120/80  Pulse: 60  Temp: 98.5 F (36.9 C)  TempSrc: Oral  SpO2: 95%  Weight: 131 lb (59.4 kg)   Body mass index is 19.35 kg/m. Physical Exam  Constitutional: No distress.  Hygiene poor today  HENT:  Head: Normocephalic and atraumatic.  Cardiovascular: Normal rate, regular  rhythm, normal heart sounds and intact distal pulses.   Pulmonary/Chest: Effort normal and breath sounds normal. No respiratory distress.  Abdominal: Soft. Bowel sounds are normal.  Musculoskeletal: Normal range of motion.  Neurological: She is alert.  Skin: Skin is warm and dry.  Psychiatric: She has a normal mood and affect.    Labs reviewed: Basic Metabolic Panel:  Recent Labs  09/03/15 1048  12/06/15 1545 01/17/16 1546 03/30/16 0805 03/30/16 1148 03/31/16 0801 04/01/16 0455  NA 137  < >  --   --  140  --  141 140  K 4.1  < >  --   --  4.5  --  4.0 4.0  CL 97  < >  --   --  105  --  109 112*  CO2 21  < >  --   --  26  --  24 22  GLUCOSE 82  < >  --   --  105*  --  84 87  BUN 33*  < >  --   --  40*  --  29* 32*  CREATININE 0.97  < >  --   --  1.11*  --  1.25* 1.16*  CALCIUM 9.6  < >  --   --  9.0  --  8.1* 8.0*  MG  --   --   --   --   --  2.0  --   --   TSH 0.668  --  0.23* 0.93  --   --   --   --   < > = values in this interval not displayed. Liver Function Tests:  Recent Labs  07/07/15 0457 03/30/16 0805  AST 22 30  ALT 7* 15  ALKPHOS 153* 122  BILITOT 0.4 0.4  PROT 5.0* 6.3*  ALBUMIN 2.5* 3.1*   No results for input(s): LIPASE, AMYLASE in the last 8760 hours. No results for input(s): AMMONIA  in the last 8760 hours. CBC:  Recent Labs  04/20/15 1451  09/03/15 1048 03/30/16 0805 03/31/16 0801  03/31/16 2107 04/01/16 0455 04/01/16 0855  WBC 6.1  < > 9.7 10.1 9.8  --   --  8.7  --   NEUTROABS 4.7  --  8.0* 8.2*  --   --   --   --   --   HGB 10.3*  < >  --  10.4* 7.1*  < > 8.8* 8.4* 8.9*  HCT 33.6*  < > 34.4 33.9* 23.1*  < > 28.1* 27.2* 28.5*  MCV 93.6  < > 90 85.0 84.6  --   --  84.7  --   PLT 375  < > 380* 337 245  --   --  241  --   < > = values in this interval not displayed. Lipid Panel: No results for input(s): CHOL, HDL, LDLCALC, TRIG, CHOLHDL, LDLDIRECT in the last 8760 hours. No results found for: HGBA1C  Procedures since last visit: Dg  Chest 2 View  Result Date: 03/30/2016 CLINICAL DATA:  Fever. EXAM: CHEST  2 VIEW COMPARISON:  02/23/2015 FINDINGS: Borderline cardiomegaly. Stable mediastinal contours. Aortic atherosclerosis. Calcified granuloma over the right chest, stable over multiple exams. There is no edema, consolidation, effusion, or pneumothorax. Chronic bilateral rotator cuff tears. Question chronic Hill-Sachs deformity on the left. Chronic fragmentation of the lateral left clavicle. IMPRESSION: Negative for pneumonia or other acute finding. Electronically Signed   By: Monte Fantasia M.D.   On: 03/30/2016 09:59   Ct Head Wo Contrast  Result Date: 03/30/2016 CLINICAL DATA:  Pain following fall EXAM: CT HEAD WITHOUT CONTRAST CT CERVICAL SPINE WITHOUT CONTRAST TECHNIQUE: Multidetector CT imaging of the head and cervical spine was performed following the standard protocol without intravenous contrast. Multiplanar CT image reconstructions of the cervical spine were also generated. COMPARISON:  Cervical spine CT myelogram June 30, 2013; head CT October 09, 2015 FINDINGS: CT HEAD FINDINGS Brain: Moderate diffuse atrophy is stable. There is a stable calcified meningioma along the dura of the right frontal region measuring 2.0 x 0.7 cm. There is no surrounding edema. There is a 6 mm right parasagittal calcified meningioma without surrounding edema as well, stable. There is no new mass. There is no hemorrhage, extra-axial fluid collection, or midline shift. There is patchy small vessel disease in the centra semiovale bilaterally. Elsewhere gray-white compartments appear unremarkable. No evident acute infarct. Vascular: There is no hyperdense vessel. There is extensive calcification in both carotid siphon regions. Skull: There is a sizable left parietal scalp hematoma. No fracture evident. The bony calvarium appears intact. Sinuses/Orbits: There is mucosal thickening in several ethmoid air cells bilaterally, more on the right than on the left.  Paranasal sinuses which are visualized elsewhere appear clear. Orbits appear symmetric bilaterally. Other: Mastoid air cells are clear. CT CERVICAL SPINE FINDINGS Alignment: There is cervical dextroscoliosis. There is 4 mm of anterolisthesis of C4 on C5. There is 3 mm of anterolisthesis of C7 on T1. No other spondylolisthesis evident. Skull base and vertebrae: The skull base and craniocervical junction regions appear normal. The patient is status post anterior screw and plate fixation at C4 and C5 with disc spacer at C4-5. Support hardware intact. There is no demonstrable acute fracture. There are no blastic or lytic bone lesions. Soft tissues and spinal canal: Prevertebral soft tissues and predental space regions are normal. There is no cord or canal hematoma seen. There is no high-grade spinal stenosis. Disc levels: There  is marked disc space narrowing at C5-6, C6-7, and C7-T1. There is moderate disc space narrowing at C2-3. Disc spacer noted at C4-5. There is multilevel osteoarthritic change with exit foraminal narrowing due to bony hypertrophy at multiple levels. Exit foraminal narrowing is most severe on the left at C5-6 where there is impression on the exiting nerve root. There is no frank disc extrusion evident. Upper chest: Visualized lung apices are clear. There is aortic atherosclerosis. Other: There is calcification in the right subclavian artery. There are foci of coronary artery calcification bilaterally. There are nodular lesions in the thyroid consistent with multinodular goiter. IMPRESSION: CT head: Sizable left parietal scalp hematoma. No fracture. Stable atrophy with periventricular small vessel disease. Stable calcified meningiomas in the right frontal region without surrounding edema. No intracranial mass, hemorrhage, or extra-axial fluid collection. No acute appearing infarct. There are foci of arterial vascular calcification. There is ethmoid sinus disease, more notable on the right than on the  left. CT cervical spine: No acute fracture. Spondylolisthesis at C4-5 and C7-T1, likely due to underlying spondylolisthesis. Note that there is postoperative fixation anteriorly at C4 and C5. There is multilevel arthropathy. Note that there is marked exit foraminal narrowing on the left at C5-6 with impression on the exit foramen at this level. Aortic atherosclerosis as well as calcification in several arterial vessels including both carotid arteries. Multinodular goiter. Electronically Signed   By: Lowella Grip III M.D.   On: 03/30/2016 09:49   Ct Cervical Spine Wo Contrast  Result Date: 03/30/2016 CLINICAL DATA:  Pain following fall EXAM: CT HEAD WITHOUT CONTRAST CT CERVICAL SPINE WITHOUT CONTRAST TECHNIQUE: Multidetector CT imaging of the head and cervical spine was performed following the standard protocol without intravenous contrast. Multiplanar CT image reconstructions of the cervical spine were also generated. COMPARISON:  Cervical spine CT myelogram June 30, 2013; head CT October 09, 2015 FINDINGS: CT HEAD FINDINGS Brain: Moderate diffuse atrophy is stable. There is a stable calcified meningioma along the dura of the right frontal region measuring 2.0 x 0.7 cm. There is no surrounding edema. There is a 6 mm right parasagittal calcified meningioma without surrounding edema as well, stable. There is no new mass. There is no hemorrhage, extra-axial fluid collection, or midline shift. There is patchy small vessel disease in the centra semiovale bilaterally. Elsewhere gray-white compartments appear unremarkable. No evident acute infarct. Vascular: There is no hyperdense vessel. There is extensive calcification in both carotid siphon regions. Skull: There is a sizable left parietal scalp hematoma. No fracture evident. The bony calvarium appears intact. Sinuses/Orbits: There is mucosal thickening in several ethmoid air cells bilaterally, more on the right than on the left. Paranasal sinuses which are  visualized elsewhere appear clear. Orbits appear symmetric bilaterally. Other: Mastoid air cells are clear. CT CERVICAL SPINE FINDINGS Alignment: There is cervical dextroscoliosis. There is 4 mm of anterolisthesis of C4 on C5. There is 3 mm of anterolisthesis of C7 on T1. No other spondylolisthesis evident. Skull base and vertebrae: The skull base and craniocervical junction regions appear normal. The patient is status post anterior screw and plate fixation at C4 and C5 with disc spacer at C4-5. Support hardware intact. There is no demonstrable acute fracture. There are no blastic or lytic bone lesions. Soft tissues and spinal canal: Prevertebral soft tissues and predental space regions are normal. There is no cord or canal hematoma seen. There is no high-grade spinal stenosis. Disc levels: There is marked disc space narrowing at C5-6, C6-7, and C7-T1. There  is moderate disc space narrowing at C2-3. Disc spacer noted at C4-5. There is multilevel osteoarthritic change with exit foraminal narrowing due to bony hypertrophy at multiple levels. Exit foraminal narrowing is most severe on the left at C5-6 where there is impression on the exiting nerve root. There is no frank disc extrusion evident. Upper chest: Visualized lung apices are clear. There is aortic atherosclerosis. Other: There is calcification in the right subclavian artery. There are foci of coronary artery calcification bilaterally. There are nodular lesions in the thyroid consistent with multinodular goiter. IMPRESSION: CT head: Sizable left parietal scalp hematoma. No fracture. Stable atrophy with periventricular small vessel disease. Stable calcified meningiomas in the right frontal region without surrounding edema. No intracranial mass, hemorrhage, or extra-axial fluid collection. No acute appearing infarct. There are foci of arterial vascular calcification. There is ethmoid sinus disease, more notable on the right than on the left. CT cervical spine: No  acute fracture. Spondylolisthesis at C4-5 and C7-T1, likely due to underlying spondylolisthesis. Note that there is postoperative fixation anteriorly at C4 and C5. There is multilevel arthropathy. Note that there is marked exit foraminal narrowing on the left at C5-6 with impression on the exit foramen at this level. Aortic atherosclerosis as well as calcification in several arterial vessels including both carotid arteries. Multinodular goiter. Electronically Signed   By: Lowella Grip III M.D.   On: 03/30/2016 09:49    Assessment/Plan 1. Laceration of scalp without foreign body, initial encounter -staples removed today -scalp was covered with dried blood and pt has not washed her hair since the injury -advised she may now shower  -hygiene was very poor overall today and she needs a CNA to assist with her bathing, dressing, grooming at this point--even has difficulty with transfers to and from the toilet and really needs snf placement -[facility] currently does not have a Education officer, museum so referral place to C3 team to assist with placement and also with helping her to get transportation to and from appts--for example, her husband says he cannot wait 4 hours with her at the wound care center  2. Chronic venous insufficiency - was doing well with bilateral leg wraps, but refused the right leg this last time--remained ok today - left leg with venous ulcer that is worsening - AMB referral to wound care center--pt now says she will in fact go   3. Dementia without behavioral disturbance, unspecified dementia type -has been notable since her last surgery--pt unable to manage her meds (getting through a mail order and does not know which she has and does not and neither does her husband--med mgt service also needed) -pt does not need to see neurology for this  - she is no longer able to do adls and even requires help cutting food which is due to her severe progressive RA (cannot take her  immunosuppressives due to her left knee joint prosthesis infection)  4. Venous ulcer of left leg (HCC) - cont santyl on granulation tissue for now and left leg wraps, [facility] seeing until she gets to wound care center and then may be needed to continue orders from [facility] -pt needs to bathe to prevent infection from occurring so needs CNA assistance b/c her husband has not bathed her and she has not bathed herself since ED visit - AMB referral to wound care center  5.  Severe protein cal malntr: Cont ensure supplements Having UC flares and more diarrhea so having more difficulty keeping her weight up and nutritional status  stable plus her husband is doing cooking and it sounds like they eat a lot of canned and processed foods  6.  Generalized weakness -pt has great difficulty getting up out of a chair, taking care of her self -needs snf, but until that happens, needs CNA at home -list of [facility] facilities provided to pt and her husband so they can plan to get her into one of them so physician and app will have access to her extensive medical records from the past 5-6 years that she's been seeing me  Labs/tests ordered:   Orders Placed This Encounter  Procedures  . AMB referral to wound care center    Referral Priority:   Routine    Referral Type:   Consultation    Referral Reason:   Specialty Services Required    Number of Visits Requested:   1  add on CNA  to [facility] orders  Pt and her husband were also asked to bring back her pills to the office (all of them) so we can confirm which she has and does not have b/c her pill service sends some at a time and then a list of what they could not send that shipment which confuses the patient terribly.   Next appt:   6 wks  Maeola Mchaney L. Ferron Ishmael, D.O. [facility] [address], [place] [address] Cell Phone (Mon-Fri 8am-5pm):  [phone] On Call:  [phone] &  follow prompts after 5pm & weekends Office Phone:  [phone] Office Fax:  [phone]

## 2016-04-11 ENCOUNTER — Telehealth: Payer: Self-pay | Admitting: *Deleted

## 2016-04-11 NOTE — Telephone Encounter (Signed)
[name] with [facility] called and stated that patient was seen yesterday by you. She Stated that patient's right leg today has some drainage, some weeping and red and swollen. The Left leg is continued to be wrapped with 4 layer compression. Nurse wants to know if it is ok to go ahead and wrap the Right leg as before. Please Advise.

## 2016-04-11 NOTE — Telephone Encounter (Signed)
Spoke with Dr. Mariea Clonts and got verbal, ok to wrap right leg.

## 2016-04-11 NOTE — Telephone Encounter (Signed)
Olivia Mackie with [facility] notified.

## 2016-04-11 NOTE — Telephone Encounter (Signed)
Nurse is requesting a Rx for Santyl to be called into pharmacy to apply to leg until she gets referred to [facility]. Please Advise.

## 2016-04-12 MED ORDER — COLLAGENASE 250 UNIT/GM EX OINT: 1.0000 "application " | TOPICAL_OINTMENT | Freq: Every day | CUTANEOUS | 0 refills | Status: DC

## 2016-04-12 NOTE — Addendum Note (Signed)
Addended by: Rafael Bihari A on: 04/12/2016 09:04 AM   Modules accepted: Orders

## 2016-04-12 NOTE — Telephone Encounter (Signed)
Please send Rx to pharmacy.  Prior home health nurse thought pt still had santyl from before which is why I didn't send it at the appt.

## 2016-04-12 NOTE — Telephone Encounter (Signed)
Rx faxed to pharmacy  

## 2016-04-14 ENCOUNTER — Other Ambulatory Visit: Payer: Self-pay | Admitting: Internal Medicine

## 2016-04-14 ENCOUNTER — Other Ambulatory Visit: Payer: Self-pay | Admitting: Nurse Practitioner

## 2016-04-14 DIAGNOSIS — M05711 Rheumatoid arthritis with rheumatoid factor of right shoulder without organ or systems involvement: Secondary | ICD-10-CM

## 2016-04-21 ENCOUNTER — Other Ambulatory Visit: Payer: Self-pay | Admitting: Nurse Practitioner

## 2016-04-21 DIAGNOSIS — I10 Essential (primary) hypertension: Secondary | ICD-10-CM | POA: Diagnosis not present

## 2016-04-21 DIAGNOSIS — R238 Other skin changes: Secondary | ICD-10-CM | POA: Diagnosis not present

## 2016-04-21 DIAGNOSIS — I739 Peripheral vascular disease, unspecified: Secondary | ICD-10-CM | POA: Diagnosis not present

## 2016-04-21 DIAGNOSIS — F419 Anxiety disorder, unspecified: Secondary | ICD-10-CM | POA: Diagnosis not present

## 2016-04-21 DIAGNOSIS — I83228 Varicose veins of left lower extremity with both ulcer of other part of lower extremity and inflammation: Secondary | ICD-10-CM | POA: Diagnosis not present

## 2016-04-21 DIAGNOSIS — R413 Other amnesia: Secondary | ICD-10-CM | POA: Diagnosis not present

## 2016-04-21 DIAGNOSIS — L97822 Non-pressure chronic ulcer of other part of left lower leg with fat layer exposed: Secondary | ICD-10-CM | POA: Diagnosis not present

## 2016-04-21 DIAGNOSIS — I480 Paroxysmal atrial fibrillation: Secondary | ICD-10-CM | POA: Diagnosis not present

## 2016-04-27 ENCOUNTER — Inpatient Hospital Stay (HOSPITAL_COMMUNITY)
Admission: EM | Admit: 2016-04-27 | Discharge: 2016-05-02 | DRG: 603 | Disposition: A | Discharge status: Skilled Nursing Facility | Payer: Medicare Other | Attending: Internal Medicine | Admitting: Internal Medicine

## 2016-04-27 ENCOUNTER — Telehealth: Payer: Self-pay | Admitting: Internal Medicine

## 2016-04-27 ENCOUNTER — Encounter (HOSPITAL_COMMUNITY): Payer: Self-pay | Admitting: *Deleted

## 2016-04-27 DIAGNOSIS — F419 Anxiety disorder, unspecified: Secondary | ICD-10-CM | POA: Diagnosis present

## 2016-04-27 DIAGNOSIS — Z79899 Other long term (current) drug therapy: Secondary | ICD-10-CM

## 2016-04-27 DIAGNOSIS — L02416 Cutaneous abscess of left lower limb: Secondary | ICD-10-CM

## 2016-04-27 DIAGNOSIS — Z9842 Cataract extraction status, left eye: Secondary | ICD-10-CM

## 2016-04-27 DIAGNOSIS — Z7901 Long term (current) use of anticoagulants: Secondary | ICD-10-CM

## 2016-04-27 DIAGNOSIS — F329 Major depressive disorder, single episode, unspecified: Secondary | ICD-10-CM | POA: Diagnosis present

## 2016-04-27 DIAGNOSIS — R001 Bradycardia, unspecified: Secondary | ICD-10-CM | POA: Diagnosis present

## 2016-04-27 DIAGNOSIS — I739 Peripheral vascular disease, unspecified: Secondary | ICD-10-CM | POA: Diagnosis present

## 2016-04-27 DIAGNOSIS — M069 Rheumatoid arthritis, unspecified: Secondary | ICD-10-CM | POA: Diagnosis present

## 2016-04-27 DIAGNOSIS — G8929 Other chronic pain: Secondary | ICD-10-CM | POA: Diagnosis present

## 2016-04-27 DIAGNOSIS — D329 Benign neoplasm of meninges, unspecified: Secondary | ICD-10-CM | POA: Diagnosis present

## 2016-04-27 DIAGNOSIS — M810 Age-related osteoporosis without current pathological fracture: Secondary | ICD-10-CM | POA: Diagnosis present

## 2016-04-27 DIAGNOSIS — M0579 Rheumatoid arthritis with rheumatoid factor of multiple sites without organ or systems involvement: Secondary | ICD-10-CM | POA: Diagnosis not present

## 2016-04-27 DIAGNOSIS — R531 Weakness: Secondary | ICD-10-CM | POA: Diagnosis present

## 2016-04-27 DIAGNOSIS — R319 Hematuria, unspecified: Secondary | ICD-10-CM | POA: Diagnosis not present

## 2016-04-27 DIAGNOSIS — K5190 Ulcerative colitis, unspecified, without complications: Secondary | ICD-10-CM | POA: Diagnosis present

## 2016-04-27 DIAGNOSIS — I110 Hypertensive heart disease with heart failure: Secondary | ICD-10-CM | POA: Diagnosis present

## 2016-04-27 DIAGNOSIS — I4819 Other persistent atrial fibrillation: Secondary | ICD-10-CM | POA: Diagnosis present

## 2016-04-27 DIAGNOSIS — E440 Moderate protein-calorie malnutrition: Secondary | ICD-10-CM | POA: Diagnosis present

## 2016-04-27 DIAGNOSIS — L97929 Non-pressure chronic ulcer of unspecified part of left lower leg with unspecified severity: Secondary | ICD-10-CM | POA: Diagnosis present

## 2016-04-27 DIAGNOSIS — I481 Persistent atrial fibrillation: Secondary | ICD-10-CM | POA: Diagnosis present

## 2016-04-27 DIAGNOSIS — L03116 Cellulitis of left lower limb: Secondary | ICD-10-CM | POA: Diagnosis present

## 2016-04-27 DIAGNOSIS — I5032 Chronic diastolic (congestive) heart failure: Secondary | ICD-10-CM | POA: Diagnosis present

## 2016-04-27 DIAGNOSIS — Z6822 Body mass index (BMI) 22.0-22.9, adult: Secondary | ICD-10-CM

## 2016-04-27 DIAGNOSIS — I83028 Varicose veins of left lower extremity with ulcer other part of lower leg: Secondary | ICD-10-CM | POA: Diagnosis present

## 2016-04-27 DIAGNOSIS — Z96652 Presence of left artificial knee joint: Secondary | ICD-10-CM | POA: Diagnosis present

## 2016-04-27 DIAGNOSIS — H9190 Unspecified hearing loss, unspecified ear: Secondary | ICD-10-CM | POA: Diagnosis present

## 2016-04-27 DIAGNOSIS — I83029 Varicose veins of left lower extremity with ulcer of unspecified site: Secondary | ICD-10-CM | POA: Diagnosis present

## 2016-04-27 DIAGNOSIS — E039 Hypothyroidism, unspecified: Secondary | ICD-10-CM | POA: Diagnosis present

## 2016-04-27 DIAGNOSIS — F17210 Nicotine dependence, cigarettes, uncomplicated: Secondary | ICD-10-CM | POA: Diagnosis present

## 2016-04-27 DIAGNOSIS — N3000 Acute cystitis without hematuria: Secondary | ICD-10-CM

## 2016-04-27 DIAGNOSIS — I872 Venous insufficiency (chronic) (peripheral): Secondary | ICD-10-CM | POA: Diagnosis present

## 2016-04-27 DIAGNOSIS — Z66 Do not resuscitate: Secondary | ICD-10-CM | POA: Diagnosis present

## 2016-04-27 DIAGNOSIS — K5100 Ulcerative (chronic) pancolitis without complications: Secondary | ICD-10-CM | POA: Diagnosis not present

## 2016-04-27 DIAGNOSIS — M549 Dorsalgia, unspecified: Secondary | ICD-10-CM | POA: Diagnosis present

## 2016-04-27 DIAGNOSIS — L03119 Cellulitis of unspecified part of limb: Secondary | ICD-10-CM | POA: Diagnosis present

## 2016-04-27 DIAGNOSIS — I480 Paroxysmal atrial fibrillation: Secondary | ICD-10-CM | POA: Diagnosis present

## 2016-04-27 DIAGNOSIS — N390 Urinary tract infection, site not specified: Secondary | ICD-10-CM | POA: Diagnosis present

## 2016-04-27 DIAGNOSIS — Z981 Arthrodesis status: Secondary | ICD-10-CM

## 2016-04-27 DIAGNOSIS — Z9841 Cataract extraction status, right eye: Secondary | ICD-10-CM

## 2016-04-27 LAB — URINALYSIS, ROUTINE W REFLEX MICROSCOPIC
BILIRUBIN URINE: NEGATIVE
GLUCOSE, UA: NEGATIVE mg/dL
KETONES UR: NEGATIVE mg/dL
Nitrite: NEGATIVE
PH: 7 (ref 5.0–8.0)
PROTEIN: 30 mg/dL — AB
Specific Gravity, Urine: 1.015 (ref 1.005–1.030)

## 2016-04-27 LAB — COMPREHENSIVE METABOLIC PANEL
ALT: 14 U/L (ref 14–54)
ANION GAP: 8 (ref 5–15)
AST: 22 U/L (ref 15–41)
Albumin: 2.7 g/dL — ABNORMAL LOW (ref 3.5–5.0)
Alkaline Phosphatase: 132 U/L — ABNORMAL HIGH (ref 38–126)
BUN: 31 mg/dL — ABNORMAL HIGH (ref 6–20)
CHLORIDE: 109 mmol/L (ref 101–111)
CO2: 23 mmol/L (ref 22–32)
CREATININE: 0.89 mg/dL (ref 0.44–1.00)
Calcium: 8.6 mg/dL — ABNORMAL LOW (ref 8.9–10.3)
Glucose, Bld: 86 mg/dL (ref 65–99)
POTASSIUM: 4.7 mmol/L (ref 3.5–5.1)
SODIUM: 140 mmol/L (ref 135–145)
Total Bilirubin: 0.7 mg/dL (ref 0.3–1.2)
Total Protein: 5.7 g/dL — ABNORMAL LOW (ref 6.5–8.1)

## 2016-04-27 LAB — CBC WITH DIFFERENTIAL/PLATELET
Basophils Absolute: 0 10*3/uL (ref 0.0–0.1)
Basophils Relative: 0 %
EOS ABS: 0.1 10*3/uL (ref 0.0–0.7)
Eosinophils Relative: 0 %
HCT: 31.3 % — ABNORMAL LOW (ref 36.0–46.0)
HEMOGLOBIN: 9.8 g/dL — AB (ref 12.0–15.0)
LYMPHS ABS: 1.2 10*3/uL (ref 0.7–4.0)
Lymphocytes Relative: 9 %
MCH: 26.8 pg (ref 26.0–34.0)
MCHC: 31.3 g/dL (ref 30.0–36.0)
MCV: 85.5 fL (ref 78.0–100.0)
MONOS PCT: 7 %
Monocytes Absolute: 0.9 10*3/uL (ref 0.1–1.0)
NEUTROS ABS: 10.6 10*3/uL — AB (ref 1.7–7.7)
NEUTROS PCT: 84 %
Platelets: 357 10*3/uL (ref 150–400)
RBC: 3.66 MIL/uL — AB (ref 3.87–5.11)
RDW: 17.7 % — ABNORMAL HIGH (ref 11.5–15.5)
WBC: 12.8 10*3/uL — AB (ref 4.0–10.5)

## 2016-04-27 LAB — I-STAT CG4 LACTIC ACID, ED: Lactic Acid, Venous: 1.07 mmol/L (ref 0.5–1.9)

## 2016-04-27 LAB — INFLUENZA PANEL BY PCR (TYPE A & B)
INFLAPCR: NEGATIVE
Influenza B By PCR: NEGATIVE

## 2016-04-27 LAB — TROPONIN I

## 2016-04-27 LAB — MRSA PCR SCREENING: MRSA by PCR: NEGATIVE

## 2016-04-27 MED ORDER — ACETAMINOPHEN 325 MG PO TABS
650.0000 mg | ORAL_TABLET | Freq: Four times a day (QID) | ORAL | Status: DC | PRN
  Administered 2016-04-28 – 2016-05-02 (×5): 650 mg via ORAL
  Filled 2016-04-27 (×5): qty 2

## 2016-04-27 MED ORDER — ONDANSETRON HCL 4 MG PO TABS: 4.0000 mg | ORAL_TABLET | Freq: Four times a day (QID) | ORAL | Status: DC | PRN

## 2016-04-27 MED ORDER — LATANOPROST 0.005 % OP SOLN
1.0000 [drp] | Freq: Every day | OPHTHALMIC | Status: DC
  Administered 2016-04-28 – 2016-05-01 (×4): 1 [drp] via OPHTHALMIC
  Filled 2016-04-27: qty 2.5

## 2016-04-27 MED ORDER — FOLIC ACID 1 MG PO TABS
2.0000 mg | ORAL_TABLET | Freq: Every day | ORAL | Status: DC
  Administered 2016-04-28 – 2016-05-02 (×5): 2 mg via ORAL
  Filled 2016-04-27 (×5): qty 2

## 2016-04-27 MED ORDER — BALSALAZIDE DISODIUM 750 MG PO CAPS
2250.0000 mg | ORAL_CAPSULE | Freq: Two times a day (BID) | ORAL | Status: DC
  Administered 2016-04-28 (×2): 2250 mg via ORAL
  Filled 2016-04-27 (×4): qty 3

## 2016-04-27 MED ORDER — DEXTROSE 5 % IV SOLN
1.0000 g | Freq: Once | INTRAVENOUS | Status: AC
  Administered 2016-04-27: 1 g via INTRAVENOUS
  Filled 2016-04-27: qty 10

## 2016-04-27 MED ORDER — LEVOTHYROXINE SODIUM 25 MCG PO TABS
75.0000 ug | ORAL_TABLET | Freq: Every day | ORAL | Status: DC
  Administered 2016-04-28 – 2016-05-02 (×5): 75 ug via ORAL
  Filled 2016-04-27 (×5): qty 1

## 2016-04-27 MED ORDER — SODIUM CHLORIDE 0.9 % IV SOLN: INTRAVENOUS | Status: DC

## 2016-04-27 MED ORDER — SODIUM CHLORIDE 0.9 % IV BOLUS (SEPSIS)
1000.0000 mL | Freq: Once | INTRAVENOUS | Status: AC
  Administered 2016-04-27: 1000 mL via INTRAVENOUS

## 2016-04-27 MED ORDER — MEDROXYPROGESTERONE ACETATE 2.5 MG PO TABS
2.5000 mg | ORAL_TABLET | Freq: Every day | ORAL | Status: DC
  Administered 2016-04-28 – 2016-05-01 (×4): 2.5 mg via ORAL
  Filled 2016-04-27 (×7): qty 1

## 2016-04-27 MED ORDER — ONDANSETRON HCL 4 MG/2ML IJ SOLN: 4.0000 mg | Freq: Four times a day (QID) | INTRAMUSCULAR | Status: DC | PRN

## 2016-04-27 MED ORDER — CALCIUM CARBONATE-VITAMIN D 500-200 MG-UNIT PO TABS
1.0000 | ORAL_TABLET | Freq: Every day | ORAL | Status: DC
  Administered 2016-04-28 – 2016-05-02 (×5): 1 via ORAL
  Filled 2016-04-27 (×5): qty 1

## 2016-04-27 MED ORDER — FUROSEMIDE 40 MG PO TABS
40.0000 mg | ORAL_TABLET | Freq: Every day | ORAL | Status: DC
  Administered 2016-04-28: 40 mg via ORAL
  Filled 2016-04-27: qty 1

## 2016-04-27 MED ORDER — ACETAMINOPHEN 500 MG PO TABS
1000.0000 mg | ORAL_TABLET | Freq: Once | ORAL | Status: AC
  Administered 2016-04-27: 1000 mg via ORAL
  Filled 2016-04-27: qty 2

## 2016-04-27 MED ORDER — PREDNISONE 5 MG PO TABS
10.0000 mg | ORAL_TABLET | Freq: Every day | ORAL | Status: DC
  Administered 2016-04-28 – 2016-05-02 (×5): 10 mg via ORAL
  Filled 2016-04-27 (×3): qty 2
  Filled 2016-04-27 (×2): qty 1

## 2016-04-27 MED ORDER — ENSURE ENLIVE PO LIQD
237.0000 mL | Freq: Two times a day (BID) | ORAL | Status: DC
  Administered 2016-04-28 – 2016-05-02 (×9): 237 mL via ORAL

## 2016-04-27 MED ORDER — APIXABAN 5 MG PO TABS
5.0000 mg | ORAL_TABLET | Freq: Two times a day (BID) | ORAL | Status: DC
  Administered 2016-04-28 – 2016-05-02 (×9): 5 mg via ORAL
  Filled 2016-04-27 (×10): qty 1

## 2016-04-27 MED ORDER — CELECOXIB 200 MG PO CAPS
200.0000 mg | ORAL_CAPSULE | Freq: Two times a day (BID) | ORAL | Status: DC
  Administered 2016-04-28 – 2016-05-02 (×9): 200 mg via ORAL
  Filled 2016-04-27 (×12): qty 1

## 2016-04-27 MED ORDER — ESTRADIOL 1 MG PO TABS
0.5000 mg | ORAL_TABLET | Freq: Every day | ORAL | Status: DC
  Administered 2016-04-28 – 2016-05-02 (×5): 0.5 mg via ORAL
  Filled 2016-04-27 (×5): qty 0.5

## 2016-04-27 MED ORDER — SODIUM CHLORIDE 0.9 % IV SOLN
INTRAVENOUS | Status: DC
  Administered 2016-04-27 – 2016-04-29 (×4): via INTRAVENOUS

## 2016-04-27 MED ORDER — CLINDAMYCIN PHOSPHATE 600 MG/50ML IV SOLN
600.0000 mg | Freq: Once | INTRAVENOUS | Status: AC
  Administered 2016-04-27: 600 mg via INTRAVENOUS
  Filled 2016-04-27: qty 50

## 2016-04-27 MED ORDER — VANCOMYCIN HCL 10 G IV SOLR
1250.0000 mg | INTRAVENOUS | Status: DC
  Administered 2016-04-27 – 2016-04-30 (×4): 1250 mg via INTRAVENOUS
  Filled 2016-04-27 (×5): qty 1250

## 2016-04-27 MED ORDER — HYDROCODONE-ACETAMINOPHEN 5-325 MG PO TABS
1.0000 | ORAL_TABLET | Freq: Once | ORAL | Status: AC
  Administered 2016-04-28: 1 via ORAL
  Filled 2016-04-27 (×3): qty 1

## 2016-04-27 MED ORDER — FERROUS SULFATE 325 (65 FE) MG PO TABS
325.0000 mg | ORAL_TABLET | Freq: Every day | ORAL | Status: DC
  Administered 2016-04-28 – 2016-05-02 (×5): 325 mg via ORAL
  Filled 2016-04-27 (×5): qty 1

## 2016-04-27 MED ORDER — ACETAMINOPHEN 650 MG RE SUPP: 650.0000 mg | Freq: Four times a day (QID) | RECTAL | Status: DC | PRN

## 2016-04-27 MED ORDER — DEXTROSE 5 % IV SOLN
2.0000 g | INTRAVENOUS | Status: DC
  Administered 2016-04-27 – 2016-04-29 (×3): 2 g via INTRAVENOUS
  Filled 2016-04-27 (×3): qty 2

## 2016-04-27 NOTE — ED Notes (Signed)
Report attempted at this time.

## 2016-04-27 NOTE — ED Triage Notes (Signed)
Pt has c/o being cold, right leg pain with cellulitis and ulcer, generalized weakness and body aches. Pt denies N/V/D and cough.

## 2016-04-27 NOTE — H&P (Signed)
History and Physical  [name] [id number] DOB: 06-19-1934 DOA: 04/27/2016   PCP: Hollace Kinnier, DO   Patient coming from: Home  Chief Complaint: confusion and generalized weakness  HPI:  [name] is a 82 y.o. female with medical history of rheumatoid arthritis, ulcerative colitis previously treated with Remicade., atrial fibrillation, diastolic CHF, hypertension, and venous stasis dermatitis presents with a 2 day history of generalized weakness. The patient is a little slow with responses, but is able to provide a fair history. She does not recall the name of her rheumatologist. Apparently, the home health nurse came to her house today to change her dressings and noticed the patient was a little confused and very weak to the point where she could not get out of bed. In addition, the home health nurse also noted increasing drainage through her left lower extremity dressing. As result, the patient was brought to the emergency department for further evaluation. Apparently, the patient is supposed to have her Unna boots changed once per week, but the patient is unable to tell me how long she has been dealing with her venous stasis dermatitis and stasis ulceration. The patient has complained of subjective fevers and chills but denies any sore throat, coughing, nausea, vomiting, or diarrhea. She denies any chest pain, shortness of breath, abdominal pain, hematochezia, melena, dysuria, hematuria. The patient complains of left lower extremity pain, but is unable to tell me how long.  However, she states that she has had  "four surgeries" on the left leg. The patient was recently discharged from the hospital after a stay from 03/30/2016 through 04/01/2016 after sustaining a mechanical fall resulting in a laceration of her scalp. She also had atrial fibrillation with RVR at that time. The patient recently saw her primary care provider, Dr. Hollace Kinnier on 04/10/2016. At that time, there was  concern for increasing drainage from her left lower extremity ulceration. The patient was placed on cefuroxime.  In the emergency department, the patient had a low-grade temperature of 99.79F but was hemodynamically stable saturating well on room air. BMP and hepatic enzymes were unremarkable. WBC was 12.8 with hemoglobin 9.8 and platelets 357,000. Lactic acid was 1.07. Urinalysis showed TNTC WBC. Influenza PCR was negative. The patient was given ceftriaxone and clindamycin emergency department as well as 2 L normal saline.  Assessment/Plan: Generalized weakness -Secondary to UTI and cellulitis left lower extremity -PT evaluation -IVF x 12 additional hrs  Cellulitis left lower extremity/infected venous stasis ulcer -Likely source is the patient's  Chronic venous stasis ulcer-->malodorous drainage -Lymphangitis streaking up from her ankle to her knee -Empiric vancomycin and cefepime -Wound care nurse consultation -follow blood culture  UTI -Empiric cefepime pending culture data -UA with TNTC WBC  Chronic diastolic CHF -Daily weights -Continue home dose furosemide -04/17/2014 echo EF 60-65%, grade 1 DD  Atrial fibrillation -Continue apixaban -Rate controlled -Presently in sinus -metoprolol on hold due to bradycardia  -CHADSVASc = 6  Hypertension -Blood pressure elevated, but acceptable -holding metoprolol secondary to bradycardia--monitor clinically for restart 2/16  Ulcerative colitis -Quiescent presently -continue balsalazide  RA -continue celecoxib and prednisone  -double dose of prednisone for stress dosing  Hypothyroidism -Continue Synthroid -Check TSH         Past Medical History:  Diagnosis Date  . Anemia   . Anxiety    sometimes   . Bruise    right lower extremity with opsite on  . Calcified cerebral meningioma ([place])   . Chronic  back pain    "my entire spine"  . Chronic bronchitis ([place]) 10/31/2011   "prone to it; I have sinus/bronchitis problem  probably q yr"  . Complication of anesthesia 2009   htn with block- for hand surgery , pt thinks anesthesia causing some long term confusion  . Depression   . Diarrhea 01/27/2015  . Dizziness   . Dysrhythmia    pt states bring off coumadin for paroxysmal atrial fibrillation for last month  . Edema   . Encounter for long-term (current) use of other medications   . Falls   . H/O clavicle fracture    left  . Hard of hearing   . History of blood transfusion   . Hypertension    pcp   dr reed   [facility]  . Hypothyroidism   . Hypothyroidism   . Multiple skin tears 10/31/2011   "get them very easily; my skin is thin and I bruise easily"  . Other and unspecified hyperlipidemia   . Peripheral vascular disease ([place])   . Pneumonia 2008  . Rheumatoid arthritis(714.0)    degenerative disc disease , ra  . Senile osteoporosis   . Spinal stenosis, unspecified region other than cervical   . Superficial thrombophlebitis 04/20/2015  . Swelling of right upper extremity 11/26/2014  . Ulcerative colitis ([place])   . Unspecified glaucoma(365.9)    Past Surgical History:  Procedure Laterality Date  . ANTERIOR CERVICAL DECOMP/DISCECTOMY FUSION N/A 02/17/2014   Procedure: CERVICAL FOUR TO FIVE ANTERIOR CERVICAL DECOMPRESSION/DISCECTOMY FUSION 1 LEVEL;  Surgeon: [name], MD;  Location: [facility];  Service: Neurosurgery;  Laterality: N/A;  C4-5 Anterior cervical decompression/diskectomy/fusion  . BACK SURGERY    . CATARACT EXTRACTION Left 11/11/2013  . CATARACT EXTRACTION Right 12/30/2013  . EXCISIONAL TOTAL KNEE ARTHROPLASTY WITH ANTIBIOTIC SPACERS Left 03/01/2015   Procedure: RESECTION LEFT TOTAL KNEE ARTHROPLASTY WITH PLACEMENT OF  ANTIBIOTIC SPACERS;  Surgeon: Paralee Cancel, MD;  Location: [facility];  Service: Orthopedics;  Laterality: Left;  . EYE SURGERY Bilateral   . FUNCTIONAL ENDOSCOPIC SINUS SURGERY  1988  . I&D KNEE WITH POLY EXCHANGE Left 10/13/2014   Procedure: INCISION AND DRAINAGE LEFT  TOTAL KNEE WITH POLY EXCHANGE;  Surgeon: Paralee Cancel, MD;  Location: [facility];  Service: Orthopedics;  Laterality: Left;  . JOINT REPLACEMENT  2009; 2006   joints 2 fingers,rt hand; joint left thumb  . POSTERIOR FUSION LUMBAR SPINE  10/31/2011   L2-3; 3-4  . POSTERIOR FUSION LUMBAR SPINE  11/2009   L3-4;  L4-5  . POSTERIOR LUMBAR FUSION N/A 08/05/13   T1, T2  . REIMPLANTATION OF TOTAL KNEE Left 07/05/2015   Procedure: REIMPLANTATION OF LEFT TOTAL KNEE WITH REMOVAL OF ANTIBIOTIC SPACER;  Surgeon: Paralee Cancel, MD;  Location: [facility];  Service: Orthopedics;  Laterality: Left;  . SINUS SURGERY WITH INSTATRAK    . SPINE SURGERY    . THYROIDECTOMY, PARTIAL  1997  . TONSILLECTOMY     "when I was a small child"  . TOTAL KNEE ARTHROPLASTY Left 06/16/2014   Procedure: TOTAL KNEE ARTHROPLASTY;  Surgeon: Paralee Cancel, MD;  Location: [facility];  Service: Orthopedics;  Laterality: Left;   Social History:  reports that she has been smoking Cigarettes.  She has a 3.00 pack-year smoking history. She has never used smokeless tobacco. She reports that she drinks alcohol. She reports that she does not use drugs.   Family History  Problem Relation Age of Onset  . Heart disease Mother   .  Cancer Father     lung     No Known Allergies   Prior to Admission medications   Medication Sig Start Date End Date Taking? Authorizing Provider  apixaban (ELIQUIS) 5 MG TABS tablet Take 1 tablet (5 mg total) by mouth 2 (two) times daily. 01/06/16  Yes [name] L [name], DO  balsalazide (COLAZAL) 750 MG capsule Take 2,250 mg by mouth 2 (two) times daily.   Yes Historical Provider, MD  bismuth subsalicylate (PEPTO BISMOL) 262 MG/15ML suspension Take 15 mLs by mouth 4 (four) times daily -  before meals and at bedtime.   Yes Historical Provider, MD  Calcium Carb-Cholecalciferol (CALCIUM + D3) 600-200 MG-UNIT TABS Take 1 tablet by mouth daily with breakfast.    Yes Historical Provider, MD  cefUROXime (CEFTIN) 500 MG tablet Take 1  tablet (500 mg total) by mouth daily. 02/07/16  Yes [name] L [name], DO  celecoxib (CELEBREX) 200 MG capsule Take 1 capsule (200 mg total) by mouth 2 (two) times daily. 01/06/16  Yes [name] L [name], DO  collagenase (SANTYL) ointment Apply 1 application topically once a week.   Yes Historical Provider, MD  diazepam (VALIUM) 5 MG tablet Take 5 mg by mouth every 12 (twelve) hours.   Yes Historical Provider, MD  docusate sodium (COLACE) 100 MG capsule Take 100 mg by mouth 2 (two) times daily as needed for mild constipation.   Yes Historical Provider, MD  estradiol (ESTRACE) 0.5 MG tablet Take 0.5 mg by mouth daily.   Yes Historical Provider, MD  ferrous sulfate 325 (65 FE) MG tablet Take 325 mg by mouth daily with breakfast.    Yes Historical Provider, MD  folic acid (FOLVITE) 1 MG tablet Take 2 mg by mouth daily.    Yes Historical Provider, MD  furosemide (LASIX) 40 MG tablet Take 40 mg by mouth daily.   Yes Historical Provider, MD  guaiFENesin (MUCINEX) 600 MG 12 hr tablet Take 600 mg by mouth 2 (two) times daily.   Yes Historical Provider, MD  latanoprost (XALATAN) 0.005 % ophthalmic solution Place 1 drop into both eyes at bedtime.   Yes Historical Provider, MD  levothyroxine (SYNTHROID, LEVOTHROID) 75 MCG tablet Take 1 tablet (75 mcg total) by mouth daily. 01/06/16  Yes [name] L [name], DO  medroxyPROGESTERone (PROVERA) 2.5 MG tablet Take 1 tablet (2.5 mg total) by mouth at bedtime. 01/06/16  Yes [place], DO    Review of Systems:  Constitutional:  No weight loss, night sweats Head&Eyes: No headache.  No vision loss.  No eye pain or scotoma ENT:  No Difficulty swallowing,Tooth/dental problems,Sore throat,  No ear ache, post nasal drip,  Cardio-vascular:  No chest pain, Orthopnea, PND, swelling in lower extremities,  dizziness, palpitations  GI:  No  abdominal pain, nausea, vomiting, diarrhea, loss of appetite, hematochezia, melena, heartburn, indigestion, Resp:  No shortness of breath  with exertion or at rest. No cough. No coughing up of blood .No wheezing.No chest wall deformity  Skin:  no rash or lesions.  GU:  no dysuria, change in color of urine, no urgency or frequency. No flank pain.  Musculoskeletal:   No decreased range of motion. No back pain.  Psych:  No change in mood or affect. No depression or anxiety. Neurologic: No headache, no dysesthesia, no focal weakness, no vision loss. No syncope  Physical Exam: Vitals:   04/27/16 1317 04/27/16 1418 04/27/16 1500 04/27/16 1600  BP: 146/65 154/66 151/67 116/67  Pulse: 78 64 (!) 56 (!)  53  Resp: 15 12 17 16   Temp: 99.4 F (37.4 C)     TempSrc: Oral     SpO2: 98% 98% 96% 98%   General:  A&O x 3, NAD, nontoxic, pleasant/cooperative Head/Eye: No conjunctival hemorrhage, no icterus, [place]/AT, No nystagmus ENT:  No icterus,  No thrush, good dentition, no pharyngeal exudate Neck:  No masses, no lymphadenpathy, no bruits CV:  RRR, no rub, no gallop, no S3 Lung:  CTAB, good air movement, no wheeze, no rhonchi Abdomen: soft/NT, +BS, nondistended, no peritoneal signs Ext: No cyanosis, No rashes, No petechiae;  2 + LLE edema with cellulitis and lymphangitis streaking from her ankle to her infrapatellar area. There is a stasis stasis ulcer on the lateral aspect of the pretibial region with malodorous yellow drainage.  Neuro: CNII-XII intact, strength 4/5 in bilateral upper and lower extremities, no dysmetria  Labs on Admission:  Basic Metabolic Panel:  Recent Labs Lab 04/27/16 1405  NA 140  K 4.7  CL 109  CO2 23  GLUCOSE 86  BUN 31*  CREATININE 0.89  CALCIUM 8.6*   Liver Function Tests:  Recent Labs Lab 04/27/16 1405  AST 22  ALT 14  ALKPHOS 132*  BILITOT 0.7  PROT 5.7*  ALBUMIN 2.7*   No results for input(s): LIPASE, AMYLASE in the last 168 hours. No results for input(s): AMMONIA in the last 168 hours. CBC:  Recent Labs Lab 04/27/16 1405  WBC 12.8*  NEUTROABS 10.6*  HGB 9.8*  HCT 31.3*    MCV 85.5  PLT 357   Coagulation Profile: No results for input(s): INR, PROTIME in the last 168 hours. Cardiac Enzymes:  Recent Labs Lab 04/27/16 1405  TROPONINI <0.03   BNP: Invalid input(s): POCBNP CBG: No results for input(s): GLUCAP in the last 168 hours. Urine analysis:    Component Value Date/Time   COLORURINE YELLOW 04/27/2016 1405   APPEARANCEUR CLOUDY (A) 04/27/2016 1405   LABSPEC 1.015 04/27/2016 1405   PHURINE 7.0 04/27/2016 1405   GLUCOSEU NEGATIVE 04/27/2016 1405   HGBUR MODERATE (A) 04/27/2016 1405   BILIRUBINUR NEGATIVE 04/27/2016 1405   KETONESUR NEGATIVE 04/27/2016 1405   PROTEINUR 30 (A) 04/27/2016 1405   UROBILINOGEN 0.2 10/13/2014 1505   NITRITE NEGATIVE 04/27/2016 1405   LEUKOCYTESUR LARGE (A) 04/27/2016 1405   Sepsis Labs: @LABRCNTIP (procalcitonin:4,lacticidven:4) )No results found for this or any previous visit (from the past 240 hour(s)).   Radiological Exams on Admission: No results found.  EKG: Independently reviewed. Sinus, no ST-T changes    Time spent:60 minutes Code Status:   DNR Family Communication:  No Family at bedside Disposition Plan: expect 2-3 day hospitalization Consults called: none DVT Prophylaxis: apixaban  Ota Ebersole, DO  [facility] Pager [phone]  If 7PM-7AM, please contact night-coverage [email or web address] Password [facility] 04/27/2016, 5:26 PM

## 2016-04-27 NOTE — ED Notes (Signed)
Bed: WA02 Expected date:  Expected time:  Means of arrival:  Comments: 82 y/o flu like symptoms

## 2016-04-27 NOTE — Telephone Encounter (Signed)
Traci a nurse from [facility] home [facility] sent patient to the ED this morning because the patient could not get out of bed, very thirsty, confused, headache, and bi lateral extremities were leaking.

## 2016-04-27 NOTE — ED Provider Notes (Signed)
[facility] DEPT Provider Note   CSN: [id number] Arrival date & time: 04/27/16  1306     History   Chief Complaint Chief Complaint  Patient presents with  . Generalized Body Aches  . Cellulitis    HPI [name] is a 82 y.o. female.  Pt presents to the ED with aches , chills, and leg pain.  Pt said that she has not been well for the past few days.  She was admitted with a.fib with rvr and chf from 1/18-1/20.  The pt is on Eliquis for her a.fib.  CHADVASC score 5.  Pt has chronic LE edema and was recently treated for cellulitis with vancomycin.  She has on bilateral unna boots.  Her left leg is draining from the boot.  Pt has been so weak that she's been unable to walk.      Past Medical History:  Diagnosis Date  . Anemia   . Anxiety    sometimes   . Bruise    right lower extremity with opsite on  . Calcified cerebral meningioma ([place])   . Chronic back pain    "my entire spine"  . Chronic bronchitis ([place]) 10/31/2011   "prone to it; I have sinus/bronchitis problem probably q yr"  . Complication of anesthesia 2009   htn with block- for hand surgery , pt thinks anesthesia causing some long term confusion  . Depression   . Diarrhea 01/27/2015  . Dizziness   . Dysrhythmia    pt states bring off coumadin for paroxysmal atrial fibrillation for last month  . Edema   . Encounter for long-term (current) use of other medications   . Falls   . H/O clavicle fracture    left  . Hard of hearing   . History of blood transfusion   . Hypertension    pcp   dr reed   [facility]  . Hypothyroidism   . Hypothyroidism   . Multiple skin tears 10/31/2011   "get them very easily; my skin is thin and I bruise easily"  . Other and unspecified hyperlipidemia   . Peripheral vascular disease ([place])   . Pneumonia 2008  . Rheumatoid arthritis(714.0)    degenerative disc disease , ra  . Senile osteoporosis   . Spinal stenosis, unspecified region other than cervical   .  Superficial thrombophlebitis 04/20/2015  . Swelling of right upper extremity 11/26/2014  . Ulcerative colitis ([place])   . Unspecified glaucoma(365.9)     Patient Active Problem List   Diagnosis Date Noted  . Fever of unknown origin 03/30/2016  . Fall against object 03/30/2016  . Venous ulcer of left leg ([place]) 10/18/2015  . Meningiomas, multiple ([place]) 10/18/2015  . Weakness 08/10/2015  . Malnutrition of moderate degree 07/07/2015  . S/P left TK reimplantation 07/05/2015  . S/P revision of total knee 07/05/2015  . Persistent atrial fibrillation ([place])   . Superficial thrombophlebitis 04/20/2015  . Left TK resection / spacer 03/01/2015  . Diarrhea 01/27/2015  . Atrial fibrillation ([place]) [I48.91] 12/14/2014  . Long term (current) use of anticoagulants [Z79.01] 12/14/2014  . Adhesive capsulitis of right shoulder 12/10/2014  . Swelling of right upper extremity 11/26/2014  . Acute on chronic diastolic CHF (congestive heart failure), NYHA class 1 ([place]) 10/29/2014  . Cellulitis 10/29/2014  . Anemia, iron deficiency 10/29/2014  . Infection of prosthetic left knee joint ([place]) 10/13/2014  . Benign essential HTN   . S/P left TKA 06/16/2014  . Cervical spondylosis with myelopathy 02/17/2014  .  Chronic venous stasis dermatitis of both lower extremities 09/08/2013  . Lumbar stenosis with neurogenic claudication 08/05/2013  . Hypothyroidism   . Rheumatoid arthritis ([place]) 11/22/2011  . Ulcerative colitis, chronic ([place]) 11/22/2011    Past Surgical History:  Procedure Laterality Date  . ANTERIOR CERVICAL DECOMP/DISCECTOMY FUSION N/A 02/17/2014   Procedure: CERVICAL FOUR TO FIVE ANTERIOR CERVICAL DECOMPRESSION/DISCECTOMY FUSION 1 LEVEL;  Surgeon: [name], MD;  Location: [facility];  Service: Neurosurgery;  Laterality: N/A;  C4-5 Anterior cervical decompression/diskectomy/fusion  . BACK SURGERY    . CATARACT EXTRACTION Left 11/11/2013  . CATARACT EXTRACTION Right 12/30/2013  . EXCISIONAL TOTAL  KNEE ARTHROPLASTY WITH ANTIBIOTIC SPACERS Left 03/01/2015   Procedure: RESECTION LEFT TOTAL KNEE ARTHROPLASTY WITH PLACEMENT OF  ANTIBIOTIC SPACERS;  Surgeon: Paralee Cancel, MD;  Location: [facility];  Service: Orthopedics;  Laterality: Left;  . EYE SURGERY Bilateral   . FUNCTIONAL ENDOSCOPIC SINUS SURGERY  1988  . I&D KNEE WITH POLY EXCHANGE Left 10/13/2014   Procedure: INCISION AND DRAINAGE LEFT TOTAL KNEE WITH POLY EXCHANGE;  Surgeon: Paralee Cancel, MD;  Location: [facility];  Service: Orthopedics;  Laterality: Left;  . JOINT REPLACEMENT  2009; 2006   joints 2 fingers,rt hand; joint left thumb  . POSTERIOR FUSION LUMBAR SPINE  10/31/2011   L2-3; 3-4  . POSTERIOR FUSION LUMBAR SPINE  11/2009   L3-4;  L4-5  . POSTERIOR LUMBAR FUSION N/A 08/05/13   T1, T2  . REIMPLANTATION OF TOTAL KNEE Left 07/05/2015   Procedure: REIMPLANTATION OF LEFT TOTAL KNEE WITH REMOVAL OF ANTIBIOTIC SPACER;  Surgeon: Paralee Cancel, MD;  Location: [facility];  Service: Orthopedics;  Laterality: Left;  . SINUS SURGERY WITH INSTATRAK    . SPINE SURGERY    . THYROIDECTOMY, PARTIAL  1997  . TONSILLECTOMY     "when I was a small child"  . TOTAL KNEE ARTHROPLASTY Left 06/16/2014   Procedure: TOTAL KNEE ARTHROPLASTY;  Surgeon: Paralee Cancel, MD;  Location: [facility];  Service: Orthopedics;  Laterality: Left;    OB History    No data available       Home Medications    Prior to Admission medications   Medication Sig Start Date End Date Taking? Authorizing Provider  apixaban (ELIQUIS) 5 MG TABS tablet Take 1 tablet (5 mg total) by mouth 2 (two) times daily. 01/06/16   [name] L [name], DO  balsalazide (COLAZAL) 750 MG capsule TAKE 3 CAPSULES TWICE DAILY 01/06/16   [name] L [name], DO  bismuth subsalicylate (PEPTO BISMOL) 262 MG/15ML suspension Take 15 mLs by mouth 4 (four) times daily -  before meals and at bedtime.    Historical Provider, MD  Calcium Carb-Cholecalciferol (CALCIUM + D3) 600-200 MG-UNIT TABS Take 1 tablet by mouth daily with  breakfast.     Historical Provider, MD  cefUROXime (CEFTIN) 500 MG tablet Take 1 tablet (500 mg total) by mouth daily. 02/07/16   [name] L [name], DO  celecoxib (CELEBREX) 200 MG capsule Take 1 capsule (200 mg total) by mouth 2 (two) times daily. 01/06/16   [name] L [name], DO  collagenase (SANTYL) ointment Apply 1 application topically daily. 04/12/16   [name] L [name], DO  doxycycline (VIBRAMYCIN) 100 MG capsule Take 1 capsule (100 mg total) by mouth 2 (two) times daily. 04/01/16   Thurnell Lose, MD  estradiol (ESTRACE) 0.5 MG tablet Take 0.5 mg by mouth daily. 09/09/15   Historical Provider, MD  ferrous sulfate 325 (65 FE) MG tablet Take 325 mg by mouth 2 (  two) times daily with a meal.    Historical Provider, MD  folic acid (FOLVITE) 1 MG tablet Take 1 mg by mouth daily.     Historical Provider, MD  furosemide (LASIX) 40 MG tablet Take 40 mg by mouth daily.    Historical Provider, MD  latanoprost (XALATAN) 0.005 % ophthalmic solution Place 1 drop into both eyes at bedtime.    Historical Provider, MD  levothyroxine (SYNTHROID, LEVOTHROID) 75 MCG tablet Take 1 tablet (75 mcg total) by mouth daily. 01/06/16   [name] L [name], DO  medroxyPROGESTERone (PROVERA) 2.5 MG tablet Take 1 tablet (2.5 mg total) by mouth at bedtime. 01/06/16   [name] L [name], DO  metoprolol tartrate (LOPRESSOR) 25 MG tablet Take 1 tablet (25 mg total) by mouth 2 (two) times daily. 01/06/16   [name] L [name], DO  Multiple Vitamin (MULTIVITAMIN WITH MINERALS) TABS Take 1 tablet by mouth daily with lunch.     Historical Provider, MD  Oxycodone HCl 10 MG TABS Take 1 tablet (10 mg total) by mouth 4 (four) times daily as needed. 04/10/16   [name] L [name], DO  polyethylene glycol (MIRALAX / GLYCOLAX) packet Take 17 g by mouth daily as needed for mild constipation.     Historical Provider, MD  potassium chloride SA (K-DUR,KLOR-CON) 20 MEQ tablet Take 20 mEq by mouth daily.    Historical Provider, MD  predniSONE (DELTASONE) 1 MG tablet TAKE  4 TABLETS BY MOUTH DAILY 04/14/16   [name] L [name], DO  spironolactone (ALDACTONE) 25 MG tablet TAKE 1 TABLET MONDAY, WEDNESDAY, FRIDAY ONLY 01/06/16   Gayland Curry, DO    Family History Family History  Problem Relation Age of Onset  . Heart disease Mother   . Cancer Father     lung    Social History Social History  Substance Use Topics  . Smoking status: Current Every Day Smoker    Packs/day: 0.05    Years: 60.00    Types: Cigarettes    Last attempt to quit: 02/20/2015  . Smokeless tobacco: Never Used     Comment: 03/30/2016 "1 puff/month; Ii dont inhale"  . Alcohol use 0.0 oz/week     Comment: 03/30/2016 "1 beer or glass of wine once/month"     Allergies   Patient has no known allergies.   Review of Systems Review of Systems  Constitutional: Positive for chills and fatigue.  Skin: Positive for rash.     Physical Exam Updated Vital Signs BP 151/67   Pulse (!) 56   Temp 99.4 F (37.4 C) (Oral)   Resp 17   SpO2 96%   Physical Exam  Constitutional: She is oriented to person, place, and time. She appears well-developed and well-nourished.  HENT:  Head: Normocephalic and atraumatic.  Right Ear: External ear normal.  Left Ear: External ear normal.  Nose: Nose normal.  Mouth/Throat: Oropharynx is clear and moist.  Eyes: Conjunctivae and EOM are normal. Pupils are equal, round, and reactive to light.  Neck: Normal range of motion. Neck supple.  Cardiovascular: Normal rate, normal heart sounds and intact distal pulses.  An irregularly irregular rhythm present.  Pulmonary/Chest: Effort normal and breath sounds normal.  Abdominal: Soft. Bowel sounds are normal.  Musculoskeletal: Normal range of motion.  Neurological: She is alert and oriented to person, place, and time.  Skin: Skin is warm.  Bilateral LE unna boots.  Yellow drainage from left leg.  Unna boot removed.  Multiple draining sores on lle with cellulitis traveling up left  leg.  Psychiatric: She has a  normal mood and affect. Her behavior is normal. Judgment and thought content normal.  Nursing note and vitals reviewed.    ED Treatments / Results  Labs (all labs ordered are listed, but only abnormal results are displayed) Labs Reviewed  COMPREHENSIVE METABOLIC PANEL - Abnormal; Notable for the following:       Result Value   BUN 31 (*)    Calcium 8.6 (*)    Total Protein 5.7 (*)    Albumin 2.7 (*)    Alkaline Phosphatase 132 (*)    All other components within normal limits  CBC WITH DIFFERENTIAL/PLATELET - Abnormal; Notable for the following:    WBC 12.8 (*)    RBC 3.66 (*)    Hemoglobin 9.8 (*)    HCT 31.3 (*)    RDW 17.7 (*)    Neutro Abs 10.6 (*)    All other components within normal limits  URINALYSIS, ROUTINE W REFLEX MICROSCOPIC - Abnormal; Notable for the following:    APPearance CLOUDY (*)    Hgb urine dipstick MODERATE (*)    Protein, ur 30 (*)    Leukocytes, UA LARGE (*)    Bacteria, UA MANY (*)    Squamous Epithelial / LPF 6-30 (*)    Non Squamous Epithelial 0-5 (*)    All other components within normal limits  CULTURE, BLOOD (ROUTINE X 2)  CULTURE, BLOOD (ROUTINE X 2)  TROPONIN I  INFLUENZA PANEL BY PCR (TYPE A & B)  I-STAT CG4 LACTIC ACID, ED    EKG  EKG Interpretation None       Radiology No results found.  Procedures Procedures (including critical care time)  Medications Ordered in ED Medications  clindamycin (CLEOCIN) IVPB 600 mg (600 mg Intravenous New Bag/Given 04/27/16 1530)  cefTRIAXone (ROCEPHIN) 1 g in dextrose 5 % 50 mL IVPB (not administered)  sodium chloride 0.9 % bolus 1,000 mL (0 mLs Intravenous Stopped 04/27/16 1449)  acetaminophen (TYLENOL) tablet 1,000 mg (1,000 mg Oral Given 04/27/16 1430)  sodium chloride 0.9 % bolus 1,000 mL (1,000 mLs Intravenous New Bag/Given 04/27/16 1530)     Initial Impression / Assessment and Plan / ED Course  I have reviewed the triage vital signs and the nursing notes.  Pertinent labs & imaging  results that were available during my care of the patient were reviewed by me and considered in my medical decision making (see chart for details).    Pt given IVFs and abx for uti and for cellulitis.  Pt d/w hospitalist for admission.  Final Clinical Impressions(s) / ED Diagnoses   Final diagnoses:  Cellulitis of left lower extremity  Acute cystitis without hematuria    New Prescriptions New Prescriptions   No medications on file     Isla Pence, MD 04/27/16 1539

## 2016-04-27 NOTE — Progress Notes (Signed)
Pharmacy Antibiotic Note  [name] is a 82 y.o. female admitted on 04/27/2016 with cellulitis left lower extremity/infected venous stasis ulcer with drainage and UTI.  Pharmacy has been consulted for vancomycin and cefepime dosing.  CrCl~47 ml/min.  Plan: Vancomycin 1250 mg IV q24h. Cefepime 2g IV q24h.     Temp (24hrs), Avg:98.5 F (36.9 C), Min:97.5 F (36.4 C), Max:99.4 F (37.4 C)   Recent Labs Lab 04/27/16 1405 04/27/16 1419  WBC 12.8*  --   CREATININE 0.89  --   LATICACIDVEN  --  1.07    CrCl cannot be calculated (Unknown ideal weight.).    No Known Allergies  Antimicrobials this admission: 2/15 Ceftriaxone x 1 2/15 Cefepime >>  2/15 Vancomycin >>   Dose adjustments this admission:   Microbiology results: 2/15 BCx:  2/15 UCx:   2/15 MRSA PCR:   Thank you for allowing pharmacy to be a part of this patient's care.  Hershal Coria 04/27/2016 6:53 PM

## 2016-04-27 NOTE — Progress Notes (Signed)
Pt refusing to take medications. Pt stating "please leave me alone". When the pt is asked if there is a reason she does not want to take her medications, the pt states "because I don't want to" and goes back to sleep.

## 2016-04-28 DIAGNOSIS — L02416 Cutaneous abscess of left lower limb: Secondary | ICD-10-CM

## 2016-04-28 DIAGNOSIS — K5100 Ulcerative (chronic) pancolitis without complications: Secondary | ICD-10-CM

## 2016-04-28 DIAGNOSIS — L03116 Cellulitis of left lower limb: Principal | ICD-10-CM

## 2016-04-28 DIAGNOSIS — I872 Venous insufficiency (chronic) (peripheral): Secondary | ICD-10-CM

## 2016-04-28 DIAGNOSIS — R319 Hematuria, unspecified: Secondary | ICD-10-CM

## 2016-04-28 DIAGNOSIS — M0579 Rheumatoid arthritis with rheumatoid factor of multiple sites without organ or systems involvement: Secondary | ICD-10-CM

## 2016-04-28 DIAGNOSIS — I5032 Chronic diastolic (congestive) heart failure: Secondary | ICD-10-CM

## 2016-04-28 DIAGNOSIS — I481 Persistent atrial fibrillation: Secondary | ICD-10-CM

## 2016-04-28 DIAGNOSIS — I83029 Varicose veins of left lower extremity with ulcer of unspecified site: Secondary | ICD-10-CM

## 2016-04-28 DIAGNOSIS — N390 Urinary tract infection, site not specified: Secondary | ICD-10-CM

## 2016-04-28 LAB — CBC WITH DIFFERENTIAL/PLATELET
BASOS PCT: 0 %
Basophils Absolute: 0 10*3/uL (ref 0.0–0.1)
EOS ABS: 0 10*3/uL (ref 0.0–0.7)
Eosinophils Relative: 0 %
HCT: 27.1 % — ABNORMAL LOW (ref 36.0–46.0)
Hemoglobin: 8.3 g/dL — ABNORMAL LOW (ref 12.0–15.0)
Lymphocytes Relative: 7 %
Lymphs Abs: 0.9 10*3/uL (ref 0.7–4.0)
MCH: 25.7 pg — ABNORMAL LOW (ref 26.0–34.0)
MCHC: 30.6 g/dL (ref 30.0–36.0)
MCV: 83.9 fL (ref 78.0–100.0)
MONO ABS: 0.4 10*3/uL (ref 0.1–1.0)
MONOS PCT: 3 %
Neutro Abs: 11.4 10*3/uL — ABNORMAL HIGH (ref 1.7–7.7)
Neutrophils Relative %: 90 %
Platelets: 349 10*3/uL (ref 150–400)
RBC: 3.23 MIL/uL — ABNORMAL LOW (ref 3.87–5.11)
RDW: 17.6 % — ABNORMAL HIGH (ref 11.5–15.5)
WBC: 12.7 10*3/uL — ABNORMAL HIGH (ref 4.0–10.5)

## 2016-04-28 LAB — COMPREHENSIVE METABOLIC PANEL
ALT: 12 U/L — ABNORMAL LOW (ref 14–54)
AST: 21 U/L (ref 15–41)
Albumin: 2.3 g/dL — ABNORMAL LOW (ref 3.5–5.0)
Alkaline Phosphatase: 107 U/L (ref 38–126)
Anion gap: 5 (ref 5–15)
BUN: 24 mg/dL — ABNORMAL HIGH (ref 6–20)
CO2: 21 mmol/L — ABNORMAL LOW (ref 22–32)
Calcium: 7.9 mg/dL — ABNORMAL LOW (ref 8.9–10.3)
Chloride: 113 mmol/L — ABNORMAL HIGH (ref 101–111)
Creatinine, Ser: 0.82 mg/dL (ref 0.44–1.00)
GFR calc Af Amer: >60 mL/min (ref >60)
GFR calc non Af Amer: >60 mL/min (ref >60)
Glucose, Bld: 90 mg/dL (ref 65–99)
Potassium: 4.3 mmol/L (ref 3.5–5.1)
Sodium: 139 mmol/L (ref 135–145)
Total Bilirubin: 0.8 mg/dL (ref 0.3–1.2)
Total Protein: 5.3 g/dL — ABNORMAL LOW (ref 6.5–8.1)

## 2016-04-28 LAB — TSH: TSH: 0.483 u[IU]/mL (ref 0.350–4.500)

## 2016-04-28 MED ORDER — SODIUM CHLORIDE 0.9 % IV BOLUS (SEPSIS)
500.0000 mL | Freq: Once | INTRAVENOUS | Status: AC
  Administered 2016-04-28: 500 mL via INTRAVENOUS

## 2016-04-28 MED ORDER — DILTIAZEM LOAD VIA INFUSION
10.0000 mg | Freq: Once | INTRAVENOUS | Status: AC
  Administered 2016-04-28: 10 mg via INTRAVENOUS
  Filled 2016-04-28: qty 10

## 2016-04-28 MED ORDER — SODIUM CHLORIDE 0.9 % IV BOLUS (SEPSIS)
1000.0000 mL | Freq: Once | INTRAVENOUS | Status: AC
  Administered 2016-04-28: 1000 mL via INTRAVENOUS

## 2016-04-28 MED ORDER — DILTIAZEM HCL ER COATED BEADS 120 MG PO CP24
120.0000 mg | ORAL_CAPSULE | Freq: Every day | ORAL | Status: DC
  Administered 2016-04-28 – 2016-04-29 (×2): 120 mg via ORAL
  Filled 2016-04-28 (×2): qty 1

## 2016-04-28 MED ORDER — DILTIAZEM HCL 100 MG IV SOLR
5.0000 mg/h | INTRAVENOUS | Status: DC
  Administered 2016-04-28: 5 mg/h via INTRAVENOUS
  Filled 2016-04-28: qty 100

## 2016-04-28 NOTE — Progress Notes (Signed)
Initial Nutrition Assessment  DOCUMENTATION CODES:   Non-severe (moderate) malnutrition in context of chronic illness  INTERVENTION:   Ensure Enlive po BID, each supplement provides 350 kcal and 20 grams of protein  Magic cup TID with meals, each supplement provides 290 kcal and 9 grams of protein  NUTRITION DIAGNOSIS:   Malnutrition related to chronic illness as evidenced by moderate to severe depletions of muscle mass, moderate to severe depletion of body fat.  GOAL:   Patient will meet greater than or equal to 90% of their needs  MONITOR:   PO intake, Supplement acceptance, Skin, Labs, Weight trends  REASON FOR ASSESSMENT:   Malnutrition Screening Tool    ASSESSMENT:   82 y.o. female with medical history of rheumatoid arthritis, ulcerative colitis previously treated with Remicade., atrial fibrillation, diastolic CHF, hypertension, and venous stasis dermatitis presents with a 2 day history of generalized weakness. Admitted for bilateral LE cellulitits and UTI   Met with pt in room today. Pt reports that she has not eaten anything since a sandwich yesterday. Pt was eating well up until 1 day pta. Pt has an Ensure on her bedside table that was 25% drank. Pt reports that she does like chocolate Ensure and would love to get them along with ice cream cups. RD will order Magic Cups. Pt with moderate to severe muscle wasting. Per chart, pt with weight gain likely related to edema in bilateral lower legs. Continue to encourage supplement and meal intakes.   Medications reviewed and include: Ca-VitD, estrace, ferrous sulfate, folic acid, lasix, synthroid, prednisone, vancomycin  Labs reviewed: Cl 113(H), C02 21(L), BUN 24(H), Ca 7.9(L), 9.26 wnl, alb 2.3(L) Wbc- 12.8(H), Hgb 9.8(L), Hct 31.3(L)  Nutrition-Focused physical exam completed. Findings are moderate to severe fat depletion, moderate to severe muscle depletion, and moderate edema in bilateral LE.   Diet Order:  Diet Heart  Room service appropriate? Yes; Fluid consistency: Thin  Skin:  Wound (see comment) (bilateral leg cellulitits)  Last BM:  none since admit  Height:   Ht Readings from Last 1 Encounters:  04/28/16 '5\' 7"'  (1.702 m)    Weight:   Wt Readings from Last 1 Encounters:  04/28/16 141 lb 7.4 oz (64.2 kg)    Ideal Body Weight:  61.3 kg  BMI:  Body mass index is 22.16 kg/m.  Estimated Nutritional Needs:   Kcal:  1600-1900kcal/day   Protein:  83-96g/day   Fluid:  >1.6L/day   EDUCATION NEEDS:   No education needs identified at this time  Koleen Distance, RD, LDN Pager #[phone]

## 2016-04-28 NOTE — Progress Notes (Signed)
[name], Ellyanah Origel called and notified of pt's condition and transfer to room 1234.

## 2016-04-28 NOTE — Progress Notes (Signed)
Pt. BP 80s/30s. MD made aware and paged. RN will continue to monitor.

## 2016-04-28 NOTE — Progress Notes (Signed)
Pt transferred to [facility] unit via bed to room 1234. Report given to [facility] at  Bedside.

## 2016-04-28 NOTE — Progress Notes (Signed)
On call NP, [place] notified of pt converting to a-fib with HR 130-150s. Pt states she is having pain on the left side of her chest which she states was just noticed when asked. Pt is not able to verbally give a number to how bad the pain is, just states "its not too bad". EKG obtained. [facility] currently on the floor and given EKG to review.

## 2016-04-28 NOTE — Progress Notes (Signed)
PT Cancellation Note  Patient Details Name: [name] MRN: [id number] DOB: June 29, 1934   Cancelled Treatment:    Reason Eval/Treat Not Completed: Patient not medically ready; pt in afib with HR 130s-150s this am, transferred to SDU, BP 95/39; will see for PT eval as schedule and medical issues allow;   [facility] 04/28/2016, 10:37 AM

## 2016-04-28 NOTE — Progress Notes (Signed)
PROGRESS NOTE    [name]  [id number] DOB: [birth year] DOA: 04/27/2016 PCP: Hollace Kinnier, DO   Brief Narrative:  [name] is a 82 y.o. female with medical history of rheumatoid arthritis, ulcerative colitis previously treated with Remicade., atrial fibrillation, diastolic CHF, hypertension, and venous stasis dermatitis presents with a 2 day history of generalized weakness. The patient is a little slow with responses, but is able to provide a fair history. She does not recall the name of her rheumatologist. Apparently, the home health nurse came to her house today to change her dressings and noticed the patient was a little confused and very weak to the point where she could not get out of bed. In addition, the home health nurse also noted increasing drainage through her left lower extremity dressing. As result, the patient was brought to the emergency department for further evaluation. Apparently, the patient is supposed to have her [place]na boots changed once per week, but the patient is unable to tell me how long she has been dealing with her venous stasis dermatitis and stasis ulceration. The patient has complained of subjective fevers and chills but denies any sore throat, coughing, nausea, vomiting, or diarrhea. She denies any chest pain, shortness of breath, abdominal pain, hematochezia, melena, dysuria, hematuria. The patient complains of left lower extremity pain, but is unable to tell me how long.  However, she states that she has had  "four surgeries" on the left leg. The patient was recently discharged from the hospital after a stay from 03/30/2016 through 04/01/2016 after sustaining a mechanical fall resulting in a laceration of her scalp. She also had atrial fibrillation with RVR at that time. The patient recently saw her primary care provider, Dr. Hollace Kinnier on 04/10/2016. At that time, there was concern for increasing drainage from her left lower extremity ulceration. The  patient was placed on cefuroxime.  In the emergency department, the patient had a low-grade temperature of 99.3F but was hemodynamically stable saturating well on room air. BMP and hepatic enzymes were unremarkable. WBC was 12.8 with hemoglobin 9.8 and platelets 357,000. Lactic acid was 1.07. Urinalysis showed TNTC WBC. Influenza PCR was negative. The patient was given ceftriaxone and clindamycin emergency department as well as 2 L normal saline. Overnight she went into Atrial Fibrillation with RVR and was transferred to SDU. She converted to NSR and was transitioned off Cardizem gtt and ended up becoming bradycardic and hypotensive so she will be closely watched in SDU.   Assessment & Plan:   Active Problems:   Rheumatoid arthritis ([place])   Ulcerative colitis, chronic (HCC)   Chronic venous stasis dermatitis of both lower extremities   Persistent atrial fibrillation (HCC)   Venous ulcer of left leg (HCC)   Cellulitis, leg   Cellulitis and abscess of left leg   UTI (urinary tract infection)   Chronic diastolic CHF (congestive heart failure) (HCC)  Atrial fibrillation with RVR s/p Conversion to NSR and Bradycardia -Continue apixaban -Went into RVR this AM and transferred to SDU and placed on Cardizem gtt -Weaned off of Cardizem gtt and placed on Diltazem 120 mg q24h -Checked and patient is not on Metoprolol as an outpatient -Presently in sinus Rhythm -TSH was 0.483 -Now Bradycardic rates -CHADSVASc = 6  Cellulitis left lower extremity/infected venous stasis ulcer -Likely source is the patient's  Chronic venous stasis ulcer-->malodorous drainage -Cultured Ulcer and Gram Stain shows Moderate GNR, Moderate Gram + Cocci and Abundant WBC predominantly PMN's; C/w Pending -Lymphangitis streaking up  from her ankle to her knee -C/w vancomycin and cefepime -C/w IVF with NS at 75 mL/hr -WBC was 12.8 -Wound care nurse consultation -Blood culture x2 showed NGTD at <24 hours  Generalized  weakness -Secondary to suspected UTI and cellulitis left lower extremity -PT evaluation -C/w IVF x 12 additional hrs  UTI -Empiric cefepime -UA with TNTC WBC -Urine Cx Pending   Chronic diastolic CHF -Daily weights -Hold home dose furosemide because of Blood Pressure -04/17/2014 echo EF 60-65%, grade 1 DD  Hypertension but currently hypotensive -Blood pressure now lower after Cardizem gtt -Not on metoprolol -Give NS bolus and continue to monitor -Continue to Monitor in SDU and follow BP closely  Ulcerative colitis -Quiescent presently -continue balsalazide  RA -continue celecoxib and prednisone  -double dose of prednisone at 10 mg for stress dosing  Hypothyroidism -Continue Synthroid -TSH was 0.483  DVT prophylaxis: Anticoagulated with Apixaban Code Status: FULL CODE Family Communication: No Family at bedside Disposition Plan: Remain in SDU today and if stable transfer to floor in AM  Consultants:   None   Procedures: None   Antimicrobials: Anti-infectives    Start     Dose/Rate Route Frequency Ordered Stop   04/27/16 2100  ceFEPIme (MAXIPIME) 2 g in dextrose 5 % 50 mL IVPB     2 g 100 mL/hr over 30 Minutes Intravenous Every 24 hours 04/27/16 1907     04/27/16 2000  vancomycin (VANCOCIN) 1,250 mg in sodium chloride 0.9 % 250 mL IVPB     1,250 mg 166.7 mL/hr over 90 Minutes Intravenous Every 24 hours 04/27/16 1907     04/27/16 1530  cefTRIAXone (ROCEPHIN) 1 g in dextrose 5 % 50 mL IVPB     1 g 100 mL/hr over 30 Minutes Intravenous  Once 04/27/16 1522 04/27/16 1651   04/27/16 1400  clindamycin (CLEOCIN) IVPB 600 mg     600 mg 100 mL/hr over 30 Minutes Intravenous  Once 04/27/16 1351 04/27/16 1621     Subjective: Seen this AM and was still slightly confused. Was upset at wound care nurse who called EMS. No nausea or vomiting. States wound is still draining. No other concerns or complaints currently.   Objective: Vitals:   04/28/16 0647 04/28/16  0655 04/28/16 0704 04/28/16 0710  BP:  137/66 98/70 117/72  Pulse:      Resp:      Temp:      TempSrc:      SpO2:  97% 99% 98%  Weight: 97.8 kg (215 lb 9.8 oz)       Intake/Output Summary (Last 24 hours) at 04/28/16 0745 Last data filed at 04/28/16 0735  Gross per 24 hour  Intake          2233.75 ml  Output                0 ml  Net          2233.75 ml   Filed Weights   04/28/16 0647  Weight: 97.8 kg (215 lb 9.8 oz)   Examination: Physical Exam:  Constitutional: NAD and appears calm and comfortable ENMT: External Ears, Nose appear normal. Grossly normal hearing.  Neck: Appears normal, supple, no cervical masses, normal ROM, no appreciable thyromegaly, no JVD Respiratory: Clear to auscultation bilaterally, no wheezing, rales, rhonchi or crackles. Normal respiratory effort and patient is not tachypenic. No accessory muscle use.  Cardiovascular: Bradycardic Rate but Regular Rhythm, no murmurs / rubs / gallops. S1 and S2 auscultated. Mild edema Abdomen: Soft,  non-tender, non-distended. No masses palpated. No appreciable hepatosplenomegaly. Bowel sounds positive.  GU: Deferred. Musculoskeletal: No clubbing / cyanosis of digits/nails. No joint deformity upper and lower extremities. Skin: left leg lateral ulcer with drainage, Right Leg venous stasis changes.   Neurologic: CN 2-12 grossly intact with no focal deficits. Romberg sign cerebellar reflexes not assessed.  Psychiatric: Impaired judgment and insight. Alert and awake but slightly confused.  Normal mood and appropriate affect.   Data Reviewed: I have personally reviewed following labs and imaging studies  CBC:  Recent Labs Lab 04/27/16 1405  WBC 12.8*  NEUTROABS 10.6*  HGB 9.8*  HCT 31.3*  MCV 85.5  PLT [id number]   Basic Metabolic Panel:  Recent Labs Lab 04/27/16 1405 04/28/16 0540  NA 140 139  K 4.7 4.3  CL 109 113*  CO2 23 21*  GLUCOSE 86 90  BUN 31* 24*  CREATININE 0.89 0.82  CALCIUM 8.6* 7.9*    GFR: Estimated Creatinine Clearance: 69.2 mL/min (by C-G formula based on SCr of 0.82 mg/dL). Liver Function Tests:  Recent Labs Lab 04/27/16 1405 04/28/16 0540  AST 22 21  ALT 14 12*  ALKPHOS 132* 107  BILITOT 0.7 0.8  PROT 5.7* 5.3*  ALBUMIN 2.7* 2.3*   No results for input(s): LIPASE, AMYLASE in the last 168 hours. No results for input(s): AMMONIA in the last 168 hours. Coagulation Profile: No results for input(s): INR, PROTIME in the last 168 hours. Cardiac Enzymes:  Recent Labs Lab 04/27/16 1405  TROPONINI <0.03   BNP (last 3 results) No results for input(s): PROBNP in the last 8760 hours. HbA1C: No results for input(s): HGBA1C in the last 72 hours. CBG: No results for input(s): GLUCAP in the last 168 hours. Lipid Profile: No results for input(s): CHOL, HDL, LDLCALC, TRIG, CHOLHDL, LDLDIRECT in the last 72 hours. Thyroid Function Tests:  Recent Labs  04/28/16 0540  TSH 0.483   Anemia Panel: No results for input(s): VITAMINB12, FOLATE, FERRITIN, TIBC, IRON, RETICCTPCT in the last 72 hours. Sepsis Labs:  Recent Labs Lab 04/27/16 1419  LATICACIDVEN 1.07    Recent Results (from the past 240 hour(s))  MRSA PCR Screening     Status: None   Collection Time: 04/27/16  6:43 PM  Result Value Ref Range Status   MRSA by PCR NEGATIVE NEGATIVE Final    Comment:        The GeneXpert MRSA Assay (FDA approved for NASAL specimens only), is one component of a comprehensive MRSA colonization surveillance program. It is not intended to diagnose MRSA infection nor to guide or monitor treatment for MRSA infections.     Radiology Studies: No results found.   Scheduled Meds: . apixaban  5 mg Oral BID  . balsalazide  2,250 mg Oral BID  . calcium-vitamin D  1 tablet Oral Q breakfast  . ceFEPime (MAXIPIME) IV  2 g Intravenous Q24H  . celecoxib  200 mg Oral BID  . estradiol  0.5 mg Oral Daily  . feeding supplement (ENSURE ENLIVE)  237 mL Oral BID BM  .  ferrous sulfate  325 mg Oral Q breakfast  . folic acid  2 mg Oral Daily  . furosemide  40 mg Oral Daily  . latanoprost  1 drop Both Eyes QHS  . levothyroxine  75 mcg Oral QAC breakfast  . medroxyPROGESTERone  2.5 mg Oral QHS  . predniSONE  10 mg Oral Q breakfast  . vancomycin  1,250 mg Intravenous Q24H   Continuous Infusions: . sodium chloride  75 mL/hr at 04/28/16 0735  . diltiazem (CARDIZEM) infusion 5 mg/hr (04/28/16 0735)     LOS: 1 day   Kerney Elbe, DO [facility] Pager [phone]  If 7PM-7AM, please contact night-coverage [email or web address] Password [facility] 04/28/2016, 7:45 AM

## 2016-04-28 NOTE — Consult Note (Addendum)
[place] Nurse wound consult note Reason for Consult: Venous stasis ulcer Wound type:full thickness and partial thicknessvenous stasis ulcers on RLE, and unstageable below left knee Pressure Injury POA: No Measurement: 2cm x 2.5cm black scabbed unstageable, no drainage or odor 6cm x 5cm x 0.2 full thickness venous ulcer bed is red but with slimy film and copious yellow malodorous drainage Right achilles area has partial thickness, pink7cm x 4cm x 0cm  Right foot plantar surface raised calous 3cm x 3cm BLE have dried scaly skin and edema Wound bed:see above Drainage (amount, consistency, odor) see above Periwound: see above Dressing procedure/placement/frequency: I have provided nurses with orders for daily dressing changes to cleanse, place Aquacell Ag+ to left outer calf wound to absorb drainage, ABD, kerlix, wrap with 4" ace in a spiral fashion from foot to below knee. No Aquacell Ag+ needed for right leg but wrap the same.  I have performed wraps for today. Paint black scabbed area below knee with betadine BID. I have ordered Prevalon boots for pressure prevention for bedridden patient. We will not follow, but will remain available to this patient, to nursing, and the medical and/or surgical teams.  Please re-consult if we need to assist further.    Fara Olden, RN-C, WTA-C Wound Treatment Associate

## 2016-04-29 LAB — COMPREHENSIVE METABOLIC PANEL
ALBUMIN: 2.2 g/dL — AB (ref 3.5–5.0)
ALT: 12 U/L — ABNORMAL LOW (ref 14–54)
AST: 22 U/L (ref 15–41)
Alkaline Phosphatase: 101 U/L (ref 38–126)
Anion gap: 4 — ABNORMAL LOW (ref 5–15)
BILIRUBIN TOTAL: 0.6 mg/dL (ref 0.3–1.2)
BUN: 25 mg/dL — AB (ref 6–20)
CO2: 21 mmol/L — AB (ref 22–32)
Calcium: 7.7 mg/dL — ABNORMAL LOW (ref 8.9–10.3)
Chloride: 116 mmol/L — ABNORMAL HIGH (ref 101–111)
Creatinine, Ser: 0.78 mg/dL (ref 0.44–1.00)
GFR calc Af Amer: >60 mL/min (ref >60)
GFR calc non Af Amer: >60 mL/min (ref >60)
GLUCOSE: 94 mg/dL (ref 65–99)
POTASSIUM: 3.6 mmol/L (ref 3.5–5.1)
Sodium: 141 mmol/L (ref 135–145)
TOTAL PROTEIN: 4.9 g/dL — AB (ref 6.5–8.1)

## 2016-04-29 LAB — CBC WITH DIFFERENTIAL/PLATELET
BASOS ABS: 0 10*3/uL (ref 0.0–0.1)
BASOS PCT: 0 %
Eosinophils Absolute: 0.1 10*3/uL (ref 0.0–0.7)
Eosinophils Relative: 1 %
HEMATOCRIT: 27.7 % — AB (ref 36.0–46.0)
Hemoglobin: 8.6 g/dL — ABNORMAL LOW (ref 12.0–15.0)
Lymphocytes Relative: 10 %
Lymphs Abs: 1.2 10*3/uL (ref 0.7–4.0)
MCH: 26.5 pg (ref 26.0–34.0)
MCHC: 31 g/dL (ref 30.0–36.0)
MCV: 85.5 fL (ref 78.0–100.0)
MONO ABS: 0.7 10*3/uL (ref 0.1–1.0)
Monocytes Relative: 6 %
NEUTROS ABS: 9.6 10*3/uL — AB (ref 1.7–7.7)
Neutrophils Relative %: 83 %
Platelets: 325 10*3/uL (ref 150–400)
RBC: 3.24 MIL/uL — ABNORMAL LOW (ref 3.87–5.11)
RDW: 17.6 % — AB (ref 11.5–15.5)
WBC: 11.6 10*3/uL — ABNORMAL HIGH (ref 4.0–10.5)

## 2016-04-29 LAB — URINE CULTURE

## 2016-04-29 LAB — PHOSPHORUS: Phosphorus: 2.2 mg/dL — ABNORMAL LOW (ref 2.5–4.6)

## 2016-04-29 LAB — MAGNESIUM: Magnesium: 1.9 mg/dL (ref 1.7–2.4)

## 2016-04-29 MED ORDER — BALSALAZIDE DISODIUM 750 MG PO CAPS
2250.0000 mg | ORAL_CAPSULE | Freq: Two times a day (BID) | ORAL | Status: DC
  Filled 2016-04-29: qty 3

## 2016-04-29 MED ORDER — ZOLPIDEM TARTRATE 5 MG PO TABS
5.0000 mg | ORAL_TABLET | Freq: Once | ORAL | Status: AC
  Administered 2016-04-29: 5 mg via ORAL
  Filled 2016-04-29: qty 1

## 2016-04-29 MED ORDER — DILTIAZEM HCL ER COATED BEADS 180 MG PO CP24
180.0000 mg | ORAL_CAPSULE | Freq: Every day | ORAL | Status: DC
  Administered 2016-04-30 – 2016-05-02 (×3): 180 mg via ORAL
  Filled 2016-04-29 (×3): qty 1

## 2016-04-29 MED ORDER — DILTIAZEM HCL ER COATED BEADS 180 MG PO CP24: 180.0000 mg | ORAL_CAPSULE | Freq: Every day | ORAL | Status: DC

## 2016-04-29 MED ORDER — OXYCODONE HCL 5 MG PO TABS
5.0000 mg | ORAL_TABLET | Freq: Four times a day (QID) | ORAL | Status: AC | PRN
  Administered 2016-04-29 – 2016-04-30 (×2): 5 mg via ORAL
  Filled 2016-04-29 (×2): qty 1

## 2016-04-29 MED ORDER — AMIODARONE IV BOLUS ONLY 150 MG/100ML
150.0000 mg | Freq: Once | INTRAVENOUS | Status: DC
  Filled 2016-04-29: qty 100

## 2016-04-29 MED ORDER — DILTIAZEM HCL ER COATED BEADS 120 MG PO CP24: 240.0000 mg | ORAL_CAPSULE | Freq: Every day | ORAL | Status: DC

## 2016-04-29 NOTE — Progress Notes (Signed)
Amiodarone not given due to pt. Converting back to NSR with HR 50s-60s and MAP 77. MD made aware. RN will continue to monitor.

## 2016-04-29 NOTE — Progress Notes (Signed)
PROGRESS NOTE    [name]  [id number] DOB: 1934-03-29 DOA: 04/27/2016 PCP: Hollace Kinnier, DO   Brief Narrative:  [name] is a 82 y.o. female with medical history of rheumatoid arthritis, ulcerative colitis previously treated with Remicade., atrial fibrillation, diastolic CHF, hypertension, and venous stasis dermatitis presents with a 2 day history of generalized weakness. The patient is a little slow with responses, but is able to provide a fair history. She does not recall the name of her rheumatologist. Apparently, the home health nurse came to her house today to change her dressings and noticed the patient was a little confused and very weak to the point where she could not get out of bed. In addition, the home health nurse also noted increasing drainage through her left lower extremity dressing. As result, the patient was brought to the emergency department for further evaluation. Apparently, the patient is supposed to have her Unna boots changed once per week, but the patient is unable to tell me how long she has been dealing with her venous stasis dermatitis and stasis ulceration. The patient has complained of subjective fevers and chills but denies any sore throat, coughing, nausea, vomiting, or diarrhea. She denies any chest pain, shortness of breath, abdominal pain, hematochezia, melena, dysuria, hematuria. The patient complains of left lower extremity pain, but is unable to tell me how long.  However, she states that she has had  "four surgeries" on the left leg. The patient was recently discharged from the hospital after a stay from 03/30/2016 through 04/01/2016 after sustaining a mechanical fall resulting in a laceration of her scalp. She also had atrial fibrillation with RVR at that time. The patient recently saw her primary care provider, Dr. Hollace Kinnier on 04/10/2016. At that time, there was concern for increasing drainage from her left lower extremity ulceration. The  patient was placed on cefuroxime.  In the emergency department, the patient had a low-grade temperature of 99.59F but was hemodynamically stable saturating well on room air. BMP and hepatic enzymes were unremarkable. WBC was 12.8 with hemoglobin 9.8 and platelets 357,000. Lactic acid was 1.07. Urinalysis showed TNTC WBC. Influenza PCR was negative. The patient was given ceftriaxone and clindamycin emergency department as well as 2 L normal saline. Overnight she went into Atrial Fibrillation with RVR and was transferred to SDU. She converted to NSR and was transitioned off Cardizem gtt and ended up becoming bradycardic and hypotensive so she was watched closely in SDU. She was bolused 1.5 Liters yesterday and improvement in her Bp. She was medically stable to be transferred to the floor however went back into Atrial Fibrillation. Case was discussed with Dr. Bronson Ing and he recommended giving the patient IV Amiodarone and changing Diltiazem to 180 mg daily. Before it was given patient converted back to NSR and IV Amiodarone was not given and patient was transferred to floor.   Assessment & Plan:   Active Problems:   Rheumatoid arthritis ([place])   Ulcerative colitis, chronic (HCC)   Chronic venous stasis dermatitis of both lower extremities   Persistent atrial fibrillation (HCC)   Venous ulcer of left leg (HCC)   Cellulitis, leg   Cellulitis and abscess of left leg   UTI (urinary tract infection)   Chronic diastolic CHF (congestive heart failure) (HCC)  Atrial fibrillation with RVR s/p Conversion to NSR and Bradycardia -Continue apixaban -Went into RVR  and transferred to SDU and placed on Cardizem gtt on the morning of 04/28/16 -Weaned off of Cardizem  gtt and placed on Diltazem 120 mg q24h and then changed to 180 mg q24h after discussion with Cardiology Dr. Bronson Ing -Patient went back into Atrial Fibrillation and was going to be given IV 150 mg of Amiodarone but spontaneously converted    -Checked and patient is not on Metoprolol as an outpatient -Presently in sinus Rhythm -TSH was 0.483 -Now Bradycardic to Normal Rates -CHADSVASc = 6 -Transfer to Medical Floor with Telemetry   Cellulitis left lower extremity/infected venous stasis ulcer -Likely source is the patient's  Chronic venous stasis ulcer-->malodorous drainage -Cultured Ulcer and Gram Stain shows Moderate GNR, Moderate Gram + Cocci and Abundant WBC predominantly PMN's; Culture Pending -Lymphangitis streaking up from her ankle to her knee -C/w vancomycin and cefepime -C/w IVF with NS at 75 mL/hr -WBC was 12.8 and improved to 11.6 -Wound care nurse consultation -Blood culture x2 showed NGTD at 2 days *-Of Note Patient has Hx of Infected Prosthetic Left Knee Joint and Venous Stasis Ulcer is on the Left -Was on Chronic Suppression of Daily Cefuroxime for Joint infection -Per Notes has refused [facility] to Monitor Closely and May discuss with Infectious Diseases  Generalized weakness -Secondary to suspected UTI and cellulitis left lower extremity -PT evaluation -C/w IVF at 75 mL/hr  UTI -Empiric cefepime -UA with TNTC WBC -Urine Cx showed Multiple Species Present and recommended recollection  Chronic diastolic CHF -Daily weights -Hold home dose furosemide because of Blood Pressure -04/17/2014 echo EF 60-65%, grade 1 DD -Continue to monitor Volume Status  Hypertension but recently hypotensive -Blood pressure wereelower after Cardizem gtt -Not on metoprolol as an outpatient -Given 1.5 L NS bolus yesterday -Transfer to Medical Floor with Telemetry as patient has stablized   Ulcerative colitis -Quiescent presently -continue balsalazide  RA -continue celecoxib and prednisone  -double dose of prednisone at 10 mg for stress dosing  Hypothyroidism -Continue Synthroid -TSH was 0.483  Meningiomas per PCP note -Followed as an outpatient by PCP and per report not a good  operative candidate  DVT prophylaxis: Anticoagulated with Apixaban Code Status: FULL CODE Family Communication: No Family at bedside Disposition Plan: Transfer to Medical Floor with Telemetry  Consultants:   Discussed Case with Cardiologist Dr. Bronson Ing    Procedures: None   Antimicrobials: Anti-infectives    Start     Dose/Rate Route Frequency Ordered Stop   04/27/16 2100  ceFEPIme (MAXIPIME) 2 g in dextrose 5 % 50 mL IVPB     2 g 100 mL/hr over 30 Minutes Intravenous Every 24 hours 04/27/16 1907     04/27/16 2000  vancomycin (VANCOCIN) 1,250 mg in sodium chloride 0.9 % 250 mL IVPB     1,250 mg 166.7 mL/hr over 90 Minutes Intravenous Every 24 hours 04/27/16 1907     04/27/16 1530  cefTRIAXone (ROCEPHIN) 1 g in dextrose 5 % 50 mL IVPB     1 g 100 mL/hr over 30 Minutes Intravenous  Once 04/27/16 1522 04/27/16 1651   04/27/16 1400  clindamycin (CLEOCIN) IVPB 600 mg     600 mg 100 mL/hr over 30 Minutes Intravenous  Once 04/27/16 1351 04/27/16 1621     Subjective: Seen this AM and doing better. States she felt a little better but wanted to move around. No nausea or vomiting. No CP or SOB.    Objective: Vitals:   04/29/16 1000 04/29/16 1020 04/29/16 1100 04/29/16 1200  BP: (!) 141/59 (!) 141/59 (!) 151/60 136/70  Pulse: (!) 58  61 (!) 108  Resp: Marland Kitchen)  22  17 18   Temp:    98.8 F (37.1 C)  TempSrc:    Axillary  SpO2: 98%  92% 95%  Weight:      Height:        Intake/Output Summary (Last 24 hours) at 04/29/16 1529 Last data filed at 04/29/16 1500  Gross per 24 hour  Intake             3550 ml  Output              400 ml  Net             3150 ml   Filed Weights   04/28/16 0647 04/28/16 1136 04/29/16 0500  Weight: 97.8 kg (215 lb 9.8 oz) 64.2 kg (141 lb 7.4 oz) 63.5 kg (139 lb 15.9 oz)   Examination: Physical Exam:  Constitutional: NAD and appears calm and comfortable ENMT: External Ears, Nose appear normal. Grossly normal hearing.  Neck: Appears normal, supple,  no cervical masses, normal ROM, no appreciable thyromegaly, no JVD Respiratory: Clear to auscultation bilaterally, no wheezing, rales, rhonchi or crackles. Normal respiratory effort and patient is not tachypenic. No accessory muscle use.  Cardiovascular: RRR no murmurs / rubs / gallops. S1 and S2 auscultated. Both legs wrapped Abdomen: Soft, non-tender, non-distended. No masses palpated. No appreciable hepatosplenomegaly. Bowel sounds positive.  GU: Deferred. Musculoskeletal: No clubbing / cyanosis of digits/nails. No joint deformity upper and lower extremities. Skin: Unable to view leg ucler and venous stasis changes today as wrapped in ACE bandages  Neurologic: CN 2-12 grossly intact with no focal deficits. Romberg sign cerebellar reflexes not assessed.  Psychiatric: Normal judgment and insight. Alert and awake.  Normal mood and appropriate affect.   Data Reviewed: I have personally reviewed following labs and imaging studies  CBC:  Recent Labs Lab 04/27/16 1405 04/28/16 1905 04/29/16 0357  WBC 12.8* 12.7* 11.6*  NEUTROABS 10.6* 11.4* 9.6*  HGB 9.8* 8.3* 8.6*  HCT 31.3* 27.1* 27.7*  MCV 85.5 83.9 85.5  PLT 357 349 [id number]   Basic Metabolic Panel:  Recent Labs Lab 04/27/16 1405 04/28/16 0540 04/29/16 0357  NA 140 139 141  K 4.7 4.3 3.6  CL 109 113* 116*  CO2 23 21* 21*  GLUCOSE 86 90 94  BUN 31* 24* 25*  CREATININE 0.89 0.82 0.78  CALCIUM 8.6* 7.9* 7.7*  MG  --   --  1.9  PHOS  --   --  2.2*   GFR: Estimated Creatinine Clearance: 55.5 mL/min (by C-G formula based on SCr of 0.78 mg/dL). Liver Function Tests:  Recent Labs Lab 04/27/16 1405 04/28/16 0540 04/29/16 0357  AST 22 21 22   ALT 14 12* 12*  ALKPHOS 132* 107 101  BILITOT 0.7 0.8 0.6  PROT 5.7* 5.3* 4.9*  ALBUMIN 2.7* 2.3* 2.2*   No results for input(s): LIPASE, AMYLASE in the last 168 hours. No results for input(s): AMMONIA in the last 168 hours. Coagulation Profile: No results for input(s): INR, PROTIME  in the last 168 hours. Cardiac Enzymes:  Recent Labs Lab 04/27/16 1405  TROPONINI <0.03   BNP (last 3 results) No results for input(s): PROBNP in the last 8760 hours. HbA1C: No results for input(s): HGBA1C in the last 72 hours. CBG: No results for input(s): GLUCAP in the last 168 hours. Lipid Profile: No results for input(s): CHOL, HDL, LDLCALC, TRIG, CHOLHDL, LDLDIRECT in the last 72 hours. Thyroid Function Tests:  Recent Labs  04/28/16 0540  TSH 0.483   Anemia  Panel: No results for input(s): VITAMINB12, FOLATE, FERRITIN, TIBC, IRON, RETICCTPCT in the last 72 hours. Sepsis Labs:  Recent Labs Lab 04/27/16 1419  LATICACIDVEN 1.07    Recent Results (from the past 240 hour(s))  Culture, blood (routine x 2)     Status: None (Preliminary result)   Collection Time: 04/27/16  2:05 PM  Result Value Ref Range Status   Specimen Description BLOOD RIGHT WRIST  Final   Special Requests BOTTLES DRAWN AEROBIC AND ANAEROBIC 6CC  Final   Culture   Final    NO GROWTH 2 DAYS Performed at [facility], [place] [address], [place], [place] [address]    Report Status PENDING  Incomplete  Culture, Urine     Status: Abnormal   Collection Time: 04/27/16  2:05 PM  Result Value Ref Range Status   Specimen Description URINE, CLEAN CATCH  Final   Special Requests NONE  Final   Culture MULTIPLE SPECIES PRESENT, SUGGEST RECOLLECTION (A)  Final   Report Status 04/29/2016 FINAL  Final  Culture, blood (routine x 2)     Status: None (Preliminary result)   Collection Time: 04/27/16  2:55 PM  Result Value Ref Range Status   Specimen Description BLOOD LEFT ANTECUBITAL  Final   Special Requests BOTTLES DRAWN AEROBIC AND ANAEROBIC 5 CC EA  Final   Culture   Final    NO GROWTH 2 DAYS Performed at [facility], [place] [address] [place], [place] [address]    Report Status PENDING  Incomplete  MRSA PCR Screening     Status: None   Collection Time: 04/27/16  6:43 PM  Result Value Ref Range  Status   MRSA by PCR NEGATIVE NEGATIVE Final    Comment:        The GeneXpert MRSA Assay (FDA approved for NASAL specimens only), is one component of a comprehensive MRSA colonization surveillance program. It is not intended to diagnose MRSA infection nor to guide or monitor treatment for MRSA infections.   Aerobic Culture (superficial specimen)     Status: None (Preliminary result)   Collection Time: 04/28/16  8:40 AM  Result Value Ref Range Status   Specimen Description LEG LEFT  Final   Special Requests NONE  Final   Gram Stain   Final    ABUNDANT WBC PRESENT, PREDOMINANTLY PMN MODERATE GRAM NEGATIVE RODS MODERATE GRAM POSITIVE COCCI IN PAIRS    Culture   Final    CULTURE REINCUBATED FOR BETTER GROWTH Performed at [facility], [place] [address], [place], [place] [address]    Report Status PENDING  Incomplete    Radiology Studies: No results found.   Scheduled Meds: . amiodarone  150 mg Intravenous Once  . apixaban  5 mg Oral BID  . balsalazide  2,250 mg Oral BID  . calcium-vitamin D  1 tablet Oral Q breakfast  . ceFEPime (MAXIPIME) IV  2 g Intravenous Q24H  . celecoxib  200 mg Oral BID  . [START ON 04/30/2016] diltiazem  180 mg Oral Daily  . estradiol  0.5 mg Oral Daily  . feeding supplement (ENSURE ENLIVE)  237 mL Oral BID BM  . ferrous sulfate  325 mg Oral Q breakfast  . folic acid  2 mg Oral Daily  . latanoprost  1 drop Both Eyes QHS  . levothyroxine  75 mcg Oral QAC breakfast  . medroxyPROGESTERone  2.5 mg Oral QHS  . predniSONE  10 mg Oral Q breakfast  . vancomycin  1,250 mg Intravenous Q24H   Continuous Infusions: . sodium chloride 75 mL/hr at 04/29/16 0400    LOS: 2 days   Kerney Elbe, DO [facility] Pager [phone]  If 7PM-7AM, please contact night-coverage [email or web address] Password [facility] 04/29/2016, 3:29 PM

## 2016-04-29 NOTE — Progress Notes (Signed)
PT Cancellation Note  Patient Details Name: [name] MRN: [id number] DOB: 1935-01-05   Cancelled Treatment:    Reason Eval/Treat Not Completed: Other (comment) (transferring to another unit. check back tomorrow.)   Claretha Cooper 04/29/2016, 4:53 PM

## 2016-04-30 LAB — COMPREHENSIVE METABOLIC PANEL
ALT: 12 U/L — ABNORMAL LOW (ref 14–54)
AST: 21 U/L (ref 15–41)
Albumin: 2.1 g/dL — ABNORMAL LOW (ref 3.5–5.0)
Alkaline Phosphatase: 97 U/L (ref 38–126)
Anion gap: 5 (ref 5–15)
BUN: 24 mg/dL — ABNORMAL HIGH (ref 6–20)
CO2: 19 mmol/L — ABNORMAL LOW (ref 22–32)
Calcium: 8 mg/dL — ABNORMAL LOW (ref 8.9–10.3)
Chloride: 115 mmol/L — ABNORMAL HIGH (ref 101–111)
Creatinine, Ser: 0.81 mg/dL (ref 0.44–1.00)
GFR calc Af Amer: >60 mL/min (ref >60)
GFR calc non Af Amer: >60 mL/min (ref >60)
Glucose, Bld: 88 mg/dL (ref 65–99)
Potassium: 3.8 mmol/L (ref 3.5–5.1)
Sodium: 139 mmol/L (ref 135–145)
Total Bilirubin: 0.5 mg/dL (ref 0.3–1.2)
Total Protein: 5 g/dL — ABNORMAL LOW (ref 6.5–8.1)

## 2016-04-30 LAB — CBC WITH DIFFERENTIAL/PLATELET
Basophils Absolute: 0 10*3/uL (ref 0.0–0.1)
Basophils Relative: 0 %
Eosinophils Absolute: 0.1 10*3/uL (ref 0.0–0.7)
Eosinophils Relative: 1 %
HCT: 27.9 % — ABNORMAL LOW (ref 36.0–46.0)
Hemoglobin: 8.7 g/dL — ABNORMAL LOW (ref 12.0–15.0)
Lymphocytes Relative: 15 %
Lymphs Abs: 1.3 10*3/uL (ref 0.7–4.0)
MCH: 26.7 pg (ref 26.0–34.0)
MCHC: 31.2 g/dL (ref 30.0–36.0)
MCV: 85.6 fL (ref 78.0–100.0)
Monocytes Absolute: 0.6 10*3/uL (ref 0.1–1.0)
Monocytes Relative: 7 %
Neutro Abs: 6.8 10*3/uL (ref 1.7–7.7)
Neutrophils Relative %: 77 %
Platelets: 380 10*3/uL (ref 150–400)
RBC: 3.26 MIL/uL — ABNORMAL LOW (ref 3.87–5.11)
RDW: 17.9 % — ABNORMAL HIGH (ref 11.5–15.5)
WBC: 8.9 10*3/uL (ref 4.0–10.5)

## 2016-04-30 LAB — PHOSPHORUS: Phosphorus: 2.3 mg/dL — ABNORMAL LOW (ref 2.5–4.6)

## 2016-04-30 LAB — MAGNESIUM: Magnesium: 1.9 mg/dL (ref 1.7–2.4)

## 2016-04-30 MED ORDER — TRAMADOL HCL 50 MG PO TABS
50.0000 mg | ORAL_TABLET | Freq: Four times a day (QID) | ORAL | Status: AC | PRN
  Administered 2016-04-30 – 2016-05-01 (×2): 50 mg via ORAL
  Filled 2016-04-30 (×2): qty 1

## 2016-04-30 MED ORDER — BALSALAZIDE DISODIUM 750 MG PO CAPS: 2250.0000 mg | ORAL_CAPSULE | Freq: Two times a day (BID) | ORAL | Status: DC

## 2016-04-30 MED ORDER — DEXTROSE 5 % IV SOLN
1.0000 g | Freq: Two times a day (BID) | INTRAVENOUS | Status: DC
  Administered 2016-04-30 – 2016-05-01 (×3): 1 g via INTRAVENOUS
  Filled 2016-04-30 (×4): qty 1

## 2016-04-30 MED ORDER — DICLOFENAC SODIUM 1 % TD GEL
2.0000 g | Freq: Four times a day (QID) | TRANSDERMAL | Status: DC | PRN
  Administered 2016-04-30 – 2016-05-02 (×3): 2 g via TOPICAL
  Filled 2016-04-30: qty 100

## 2016-04-30 MED ORDER — DOCUSATE SODIUM 100 MG PO CAPS
100.0000 mg | ORAL_CAPSULE | Freq: Every day | ORAL | Status: DC | PRN
  Filled 2016-04-30: qty 1

## 2016-04-30 NOTE — Progress Notes (Signed)
Pharmacy Antibiotic Note  [name] is a 82 y.o. female admitted on 04/27/2016 with cellulitis left lower extremity/infected venous stasis ulcer with drainage and UTI.  Pharmacy has been consulted for vancomycin and cefepime dosing.  Today, 04/30/2016: Day #3 antibiotics  Renal: SCr stable  WBC improving  Wound cx pending but no GPC clusters on stain  Appears to be superificial cx and not tissue  afebriile  Plan:  Vancomycin 1250mg  IV q24h  Check trough 2/19 PM if continued  Monitor renal function with daily SCr  Change cefepime 1gm IV q12h  Height: 5\' 7"  (170.2 cm) Weight: 141 lb 15.6 oz (64.4 kg) IBW/kg (Calculated) : 61.6  Temp (24hrs), Avg:98.8 F (37.1 C), Min:98.3 F (36.8 C), Max:99.4 F (37.4 C)   Recent Labs Lab 04/27/16 1405 04/27/16 1419 04/28/16 0540 04/28/16 1905 04/29/16 0357 04/30/16 0505  WBC 12.8*  --   --  12.7* 11.6* 8.9  CREATININE 0.89  --  0.82  --  0.78 0.81  LATICACIDVEN  --  1.07  --   --   --   --     Estimated Creatinine Clearance: 54.8 mL/min (by C-G formula based on SCr of 0.81 mg/dL).    No Known Allergies  Antimicrobials this admission: 2/15 Ceftriaxone x 1 2/15 Cefepime >>  2/15 Vancomycin >>    Dose adjustments this admission:     Microbiology results: 2/16 wound: moderate GNR, GPC - reincubated 2/15 BCx: ngtd 2/15 UCx:  mult. Sp present, suggest recollection 2/15 MRSA PCR: neg  Thank you for allowing pharmacy to be a part of this patient's care.  Doreene Eland, PharmD, BCPS.   Pager: [phone] 04/30/2016 10:41 AM

## 2016-04-30 NOTE — Progress Notes (Signed)
PROGRESS NOTE    [name]  [id number] DOB: 08/28/1934 DOA: 04/27/2016 PCP: Hollace Kinnier, DO   Brief Narrative:  [name] is a 82 y.o. female with medical history of rheumatoid arthritis, ulcerative colitis previously treated with Remicade., atrial fibrillation, diastolic CHF, hypertension, and venous stasis dermatitis presents with a 2 day history of generalized weakness. The patient is a little slow with responses, but is able to provide a fair history. She does not recall the name of her rheumatologist. Apparently, the home health nurse came to her house today to change her dressings and noticed the patient was a little confused and very weak to the point where she could not get out of bed. In addition, the home health nurse also noted increasing drainage through her left lower extremity dressing. As result, the patient was brought to the emergency department for further evaluation. Apparently, the patient is supposed to have her Unna boots changed once per week, but the patient is unable to tell me how long she has been dealing with her venous stasis dermatitis and stasis ulceration. The patient has complained of subjective fevers and chills but denies any sore throat, coughing, nausea, vomiting, or diarrhea. She denies any chest pain, shortness of breath, abdominal pain, hematochezia, melena, dysuria, hematuria. The patient complains of left lower extremity pain, but is unable to tell me how long.  However, she states that she has had  "four surgeries" on the left leg. The patient was recently discharged from the hospital after a stay from 03/30/2016 through 04/01/2016 after sustaining a mechanical fall resulting in a laceration of her scalp. She also had atrial fibrillation with RVR at that time. The patient recently saw her primary care provider, Dr. Hollace Kinnier on 04/10/2016. At that time, there was concern for increasing drainage from her left lower extremity ulceration. The  patient was placed on cefuroxime.  In the emergency department, the patient had a low-grade temperature of 99.57F but was hemodynamically stable saturating well on room air. BMP and hepatic enzymes were unremarkable. WBC was 12.8 with hemoglobin 9.8 and platelets 357,000. Lactic acid was 1.07. Urinalysis showed TNTC WBC. Influenza PCR was negative. The patient was given ceftriaxone and clindamycin emergency department as well as 2 L normal saline. Overnight she went into Atrial Fibrillation with RVR and was transferred to SDU. She converted to NSR and was transitioned off Cardizem gtt and ended up becoming bradycardic and hypotensive so she was watched closely in SDU. She was bolused 1.5 Liters yesterday and improvement in her Bp. She was medically stable to be transferred to the floor however went back into Atrial Fibrillation. Case was discussed with Dr. Bronson Ing and he recommended giving the patient IV Amiodarone and changing Diltiazem to 180 mg daily. Before it was given patient converted back to NSR and IV Amiodarone was not given and patient was transferred to floor. Patient doing well on the floor. Updated Husband and PT recommends SNF.   Assessment & Plan:   Active Problems:   Rheumatoid arthritis ([place])   Ulcerative colitis, chronic (HCC)   Chronic venous stasis dermatitis of both lower extremities   Persistent atrial fibrillation (HCC)   Venous ulcer of left leg (HCC)   Cellulitis, leg   Cellulitis and abscess of left leg   UTI (urinary tract infection)   Chronic diastolic CHF (congestive heart failure) (HCC)  Atrial fibrillation with RVR s/p Conversion to NSR and Bradycardia -Continue apixaban -Went into RVR  and transferred to SDU and placed  on Cardizem gtt on the morning of 04/28/16 -Weaned off of Cardizem gtt and placed on Diltazem 120 mg q24h and then changed to 180 mg q24h after discussion with Cardiology Dr. Bronson Ing -Patient went back into Atrial Fibrillation and was going  to be given IV 150 mg of Amiodarone but spontaneously converted  -Checked and patient is not on Metoprolol as an outpatient -Presently in sinus Rhythm -TSH was 0.483 -CHADSVASc = 6 -Remains in NSR   Cellulitis left lower extremity/infected venous stasis ulcer -Likely source is the patient's  Chronic venous stasis ulcer-->malodorous drainage -Cultured Ulcer and Gram Stain shows Moderate GNR, Moderate Gram + Cocci and Abundant WBC predominantly PMN's; Culture Pending -Lymphangitis streaking up from her ankle to her knee -C/w vancomycin and cefepime -D/C'd IVF with NS at 75 mL/hr -WBC was 12.8 and improved to 8.9 -Wound care nurse consultation -Blood culture x2 showed NGTD at 3 days *-Of Note Patient has Hx of Infected Prosthetic Left Knee Joint and Venous Stasis Ulcer is on the Left -Was on Chronic Suppression of Daily Cefuroxime for Joint infection -Per Notes has refused [place] -Continue to Monitor Closely and May discuss with Infectious Diseases  Generalized weakness -Secondary to suspected UTI and cellulitis left lower extremity -PT evaluation Recommends SNF -D/C'd IVF at 75 mL/hr  UTI -Empiric cefepime -UA with TNTC WBC -Urine Cx showed Multiple Species Present and recommended recollection  Chronic diastolic CHF -Daily weights -Hold home dose furosemide because of Blood Pressure -04/17/2014 echo EF 60-65%, grade 1 DD -Continue to monitor Volume Status  Hypertension but recently hypotensive -Blood pressure wereelower after Cardizem gtt -Not on metoprolol as an outpatient -Given 1.5 L NS bolus yesterday -Transfer to Medical Floor with Telemetry as patient has stablized   Ulcerative colitis -Quiescent presently -continue balsalazide  RA -continue celecoxib and prednisone  -double dose of prednisone at 10 mg for stress dosing  Hypothyroidism -Continue Synthroid -TSH was 0.483  Meningiomas per PCP note -Followed as an outpatient by PCP and per  report not a good operative candidate  DVT prophylaxis: Anticoagulated with Apixaban Code Status: FULL CODE Family Communication: No Family at bedside; Called patient's Husband and updated him over the phone Disposition Plan: SNF  Consultants:   Discussed Case with Cardiologist Dr. Bronson Ing    Procedures: None   Antimicrobials: Anti-infectives    Start     Dose/Rate Route Frequency Ordered Stop   04/30/16 1200  ceFEPIme (MAXIPIME) 1 g in dextrose 5 % 50 mL IVPB     1 g 100 mL/hr over 30 Minutes Intravenous Every 12 hours 04/30/16 1041     04/27/16 2100  ceFEPIme (MAXIPIME) 2 g in dextrose 5 % 50 mL IVPB  Status:  Discontinued     2 g 100 mL/hr over 30 Minutes Intravenous Every 24 hours 04/27/16 1907 04/30/16 1041   04/27/16 2000  vancomycin (VANCOCIN) 1,250 mg in sodium chloride 0.9 % 250 mL IVPB     1,250 mg 166.7 mL/hr over 90 Minutes Intravenous Every 24 hours 04/27/16 1907     04/27/16 1530  cefTRIAXone (ROCEPHIN) 1 g in dextrose 5 % 50 mL IVPB     1 g 100 mL/hr over 30 Minutes Intravenous  Once 04/27/16 1522 04/27/16 1651   04/27/16 1400  clindamycin (CLEOCIN) IVPB 600 mg     600 mg 100 mL/hr over 30 Minutes Intravenous  Once 04/27/16 1351 04/27/16 1621     Subjective: Seen this AM and doing better. Denies any active complaints but complaint  to the nurse about neck pain. No nausea or vomiting. PT recommending SNF.  Objective: Vitals:   04/29/16 1548 04/29/16 2130 04/30/16 0505 04/30/16 1349  BP: (!) 125/55 124/60 (!) 146/69 132/64  Pulse: (!) 58 68 (!) 56 66  Resp: 16 16 16 18   Temp: 98.8 F (37.1 C) 99.4 F (37.4 C) 98.3 F (36.8 C) 98.3 F (36.8 C)  TempSrc: Oral Oral Oral Oral  SpO2: 99% 98% 100% 100%  Weight:   64.4 kg (141 lb 15.6 oz)   Height:        Intake/Output Summary (Last 24 hours) at 04/30/16 1730 Last data filed at 04/30/16 1350  Gross per 24 hour  Intake             2075 ml  Output                0 ml  Net             2075 ml   Filed  Weights   04/28/16 1136 04/29/16 0500 04/30/16 0505  Weight: 64.2 kg (141 lb 7.4 oz) 63.5 kg (139 lb 15.9 oz) 64.4 kg (141 lb 15.6 oz)   Examination: Physical Exam:  Constitutional: NAD and appears calm and comfortable ENMT: External Ears, Nose appear normal. Grossly normal hearing.  Neck: Appears normal, supple, no cervical masses, normal ROM, no appreciable thyromegaly, no JVD Respiratory: Clear to auscultation bilaterally, no wheezing, rales, rhonchi or crackles. Normal respiratory effort and patient is not tachypenic. No accessory muscle use.  Cardiovascular: RRR no murmurs / rubs / gallops. S1 and S2 auscultated. Both legs wrapped Abdomen: Soft, non-tender, non-distended. No masses palpated. No appreciable hepatosplenomegaly. Bowel sounds positive.  GU: Deferred. Musculoskeletal: No clubbing / cyanosis of digits/nails. No joint deformity upper and lower extremities. Skin: Unable to view leg ucler and venous stasis changes today as wrapped in ACE bandages  Neurologic: CN 2-12 grossly intact with no focal deficits. Romberg sign cerebellar reflexes not assessed.  Psychiatric: Normal judgment and insight. Alert and awake.  Normal mood and appropriate affect.   Data Reviewed: I have personally reviewed following labs and imaging studies  CBC:  Recent Labs Lab 04/27/16 1405 04/28/16 1905 04/29/16 0357 04/30/16 0505  WBC 12.8* 12.7* 11.6* 8.9  NEUTROABS 10.6* 11.4* 9.6* 6.8  HGB 9.8* 8.3* 8.6* 8.7*  HCT 31.3* 27.1* 27.7* 27.9*  MCV 85.5 83.9 85.5 85.6  PLT 357 349 325 [id number]   Basic Metabolic Panel:  Recent Labs Lab 04/27/16 1405 04/28/16 0540 04/29/16 0357 04/30/16 0505  NA 140 139 141 139  K 4.7 4.3 3.6 3.8  CL 109 113* 116* 115*  CO2 23 21* 21* 19*  GLUCOSE 86 90 94 88  BUN 31* 24* 25* 24*  CREATININE 0.89 0.82 0.78 0.81  CALCIUM 8.6* 7.9* 7.7* 8.0*  MG  --   --  1.9 1.9  PHOS  --   --  2.2* 2.3*   GFR: Estimated Creatinine Clearance: 54.8 mL/min (by C-G formula  based on SCr of 0.81 mg/dL). Liver Function Tests:  Recent Labs Lab 04/27/16 1405 04/28/16 0540 04/29/16 0357 04/30/16 0505  AST 22 21 22 21   ALT 14 12* 12* 12*  ALKPHOS 132* 107 101 97  BILITOT 0.7 0.8 0.6 0.5  PROT 5.7* 5.3* 4.9* 5.0*  ALBUMIN 2.7* 2.3* 2.2* 2.1*   No results for input(s): LIPASE, AMYLASE in the last 168 hours. No results for input(s): AMMONIA in the last 168 hours. Coagulation Profile: No results for input(s):  INR, PROTIME in the last 168 hours. Cardiac Enzymes:  Recent Labs Lab 04/27/16 1405  TROPONINI <0.03   BNP (last 3 results) No results for input(s): PROBNP in the last 8760 hours. HbA1C: No results for input(s): HGBA1C in the last 72 hours. CBG: No results for input(s): GLUCAP in the last 168 hours. Lipid Profile: No results for input(s): CHOL, HDL, LDLCALC, TRIG, CHOLHDL, LDLDIRECT in the last 72 hours. Thyroid Function Tests:  Recent Labs  04/28/16 0540  TSH 0.483   Anemia Panel: No results for input(s): VITAMINB12, FOLATE, FERRITIN, TIBC, IRON, RETICCTPCT in the last 72 hours. Sepsis Labs:  Recent Labs Lab 04/27/16 1419  LATICACIDVEN 1.07    Recent Results (from the past 240 hour(s))  Culture, blood (routine x 2)     Status: None (Preliminary result)   Collection Time: 04/27/16  2:05 PM  Result Value Ref Range Status   Specimen Description BLOOD RIGHT WRIST  Final   Special Requests BOTTLES DRAWN AEROBIC AND ANAEROBIC 6CC  Final   Culture   Final    NO GROWTH 3 DAYS Performed at [facility], [place] [address], [place], [place] [address]    Report Status PENDING  Incomplete  Culture, Urine     Status: Abnormal   Collection Time: 04/27/16  2:05 PM  Result Value Ref Range Status   Specimen Description URINE, CLEAN CATCH  Final   Special Requests NONE  Final   Culture MULTIPLE SPECIES PRESENT, SUGGEST RECOLLECTION (A)  Final   Report Status 04/29/2016 FINAL  Final  Culture, blood (routine x 2)     Status: None  (Preliminary result)   Collection Time: 04/27/16  2:55 PM  Result Value Ref Range Status   Specimen Description BLOOD LEFT ANTECUBITAL  Final   Special Requests BOTTLES DRAWN AEROBIC AND ANAEROBIC 5 CC EA  Final   Culture   Final    NO GROWTH 3 DAYS Performed at [facility], [place] [address] [place], [place] [address]    Report Status PENDING  Incomplete  MRSA PCR Screening     Status: None   Collection Time: 04/27/16  6:43 PM  Result Value Ref Range Status   MRSA by PCR NEGATIVE NEGATIVE Final    Comment:        The GeneXpert MRSA Assay (FDA approved for NASAL specimens only), is one component of a comprehensive MRSA colonization surveillance program. It is not intended to diagnose MRSA infection nor to guide or monitor treatment for MRSA infections.   Aerobic Culture (superficial specimen)     Status: None (Preliminary result)   Collection Time: 04/28/16  8:40 AM  Result Value Ref Range Status   Specimen Description LEG LEFT  Final   Special Requests NONE  Final   Gram Stain   Final    ABUNDANT WBC PRESENT, PREDOMINANTLY PMN MODERATE GRAM NEGATIVE RODS MODERATE GRAM POSITIVE COCCI IN PAIRS    Culture   Final    CULTURE REINCUBATED FOR BETTER GROWTH Performed at [facility], [place] [address], [place], [place] [address]    Report Status PENDING  Incomplete    Radiology Studies: No results found.   Scheduled Meds: . amiodarone  150 mg Intravenous Once  . apixaban  5 mg Oral BID  . [START ON 05/01/2016] balsalazide  2,250 mg Oral BID  . calcium-vitamin D  1 tablet Oral Q breakfast  . ceFEPime (MAXIPIME) IV  1 g Intravenous Q12H  . celecoxib  200 mg  Oral BID  . diltiazem  180 mg Oral Daily  . estradiol  0.5 mg Oral Daily  . feeding supplement (ENSURE ENLIVE)  237 mL Oral BID BM  . ferrous sulfate  325 mg Oral Q breakfast  . folic acid  2 mg Oral Daily  . latanoprost  1 drop Both Eyes QHS  . levothyroxine  75 mcg Oral QAC breakfast  .  medroxyPROGESTERone  2.5 mg Oral QHS  . predniSONE  10 mg Oral Q breakfast  . vancomycin  1,250 mg Intravenous Q24H   Continuous Infusions:   LOS: 3 days   Kerney Elbe, DO [facility] Pager [phone]  If 7PM-7AM, please contact night-coverage [email or web address] Password [email or web address] 04/30/2016, 5:30 PM

## 2016-04-30 NOTE — Evaluation (Signed)
Physical Therapy Evaluation Patient Details Name: [name] MRN: [id number] DOB: 1934-05-23 Today's Date: 04/30/2016   History of Present Illness  Patient is a 82 y.o. female with a PMH significant for atrial fibrillation, CHF, hypothyroidism, RA, and UC who presented to ED on 04/27/16 with cellulitis left lower extremity/infected venous stasis ulcer with drainage and UTI  Clinical Impression  The patient has severe joint deformities due to arthritis. Requires 2 assist today for transfer to [facility]. Recommend SNF. Pt admitted with above diagnosis. Pt currently with functional limitations due to the deficits listed below (see PT Problem List).  Pt will benefit from skilled PT to increase their independence and safety with mobility to allow discharge to the venue listed below.       Follow Up Recommendations SNF;Supervision/Assistance - 24 hour    Equipment Recommendations  None recommended by PT    Recommendations for Other Services       Precautions / Restrictions Precautions Precautions: Fall Precaution Comments: leg wounds, weeping      Mobility  Bed Mobility Overal bed mobility: Needs Assistance Bed Mobility: Supine to Sit;Sit to Supine     Supine to sit: Mod assist Sit to supine: +2 for physical assistance;+2 for safety/equipment   General bed mobility comments: assist for trunk and legs  Transfers Overall transfer level: Needs assistance Equipment used: Rolling walker (2 wheeled) Transfers: Stand Pivot Transfers;Sit to/from Stand Sit to Stand: Max assist;+2 physical assistance;+2 safety/equipment;From elevated surface Stand pivot transfers: Max assist;+2 safety/equipment;From elevated surface;+2 physical assistance       General transfer comment: assist to rise  with tries before satnding. Lifting assistance required. slow small shuffle steps to get to the /bsc then back to bed.  Ambulation/Gait                Stairs            Wheelchair  Mobility    Modified Rankin (Stroke Patients Only)       Balance Overall balance assessment: History of Falls;Needs assistance Sitting-balance support: Feet supported;No upper extremity supported Sitting balance-Leahy Scale: Fair     Standing balance support: During functional activity;Bilateral upper extremity supported Standing balance-Leahy Scale: Poor                               Pertinent Vitals/Pain Pain Assessment: Faces Faces Pain Scale: Hurts worst Pain Location: legs  joints in hips, arms and knees, audible crepitus in knees and hips Pain Descriptors / Indicators: Aching;Discomfort;Grimacing;Guarding Pain Intervention(s): Limited activity within patient's tolerance;Monitored during session    Home Living Family/patient expects to be discharged to:: Private residence Living Arrangements: Spouse/significant other Available Help at Discharge: Family Type of Home: House Home Access: Stairs to enter Entrance Stairs-Rails: Right;Left;Can reach both Entrance Stairs-Number of Steps: deck-4  Home Layout: Multi-level;Able to live on main level with bedroom/bathroom Home Equipment: Gilford Rile - 2 wheels;Cane - single point;Bedside commode Additional Comments: Above taken from PT entry this admission.     Prior Function Level of Independence: Needs assistance      ADL's / Homemaking Assistance Needed: Pt reports that she has been sponge bathing         Hand Dominance        Extremity/Trunk Assessment   Upper Extremity Assessment Upper Extremity Assessment: RUE deficits/detail;LUE deficits/detail RUE Deficits / Details: severe joint deformities, decreased shoulder elevation. unable to use the call bell LUE Deficits / Details: same as the rt  Lower Extremity Assessment Lower Extremity Assessment: RLE deficits/detail;LLE deficits/detail RLE Deficits / Details: legs are wrapped in Unna boot. significant deformities, severe crepitus about the hip and  knee       Communication      Cognition Arousal/Alertness: Awake/alert Behavior During Therapy: WFL for tasks assessed/performed Overall Cognitive Status: Within Functional Limits for tasks assessed                      General Comments      Exercises     Assessment/Plan    PT Assessment Patient needs continued PT services  PT Problem List Decreased strength;Decreased range of motion;Decreased activity tolerance;Decreased balance;Decreased mobility;Decreased knowledge of precautions;Decreased safety awareness;Decreased knowledge of use of DME;Pain;Decreased skin integrity          PT Treatment Interventions DME instruction;Gait training;Functional mobility training;Therapeutic activities;Therapeutic exercise;Patient/family education    PT Goals (Current goals can be found in the Care Plan section)  Acute Rehab PT Goals Patient Stated Goal: to be able to walk PT Goal Formulation: With patient Time For Goal Achievement: 05/14/16 Potential to Achieve Goals: Fair    Frequency Min 3X/week   Barriers to discharge Decreased caregiver support;Inaccessible home environment      Co-evaluation               End of Session   Activity Tolerance: Patient limited by fatigue Patient left: in bed;with call bell/phone within reach;with bed alarm set Nurse Communication: Mobility status         Time: [id number] PT Time Calculation (min) (ACUTE ONLY): 41 min   Charges:   PT Evaluation $PT Eval Moderate Complexity: 1 Procedure PT Treatments $Therapeutic Activity: 8-22 mins $Self Care/Home Management: 8-22   PT G Codes:        Claretha Cooper 04/30/2016, 5:06 PM Tresa Endo PT [phone]

## 2016-05-01 LAB — COMPREHENSIVE METABOLIC PANEL
ALBUMIN: 2.1 g/dL — AB (ref 3.5–5.0)
ALT: 13 U/L — ABNORMAL LOW (ref 14–54)
ANION GAP: 4 — AB (ref 5–15)
AST: 25 U/L (ref 15–41)
Alkaline Phosphatase: 101 U/L (ref 38–126)
BUN: 22 mg/dL — AB (ref 6–20)
CHLORIDE: 114 mmol/L — AB (ref 101–111)
CO2: 21 mmol/L — AB (ref 22–32)
Calcium: 8.2 mg/dL — ABNORMAL LOW (ref 8.9–10.3)
Creatinine, Ser: 0.99 mg/dL (ref 0.44–1.00)
GFR calc Af Amer: >60 mL/min (ref >60)
GFR calc non Af Amer: 53 mL/min — ABNORMAL LOW (ref >60)
GLUCOSE: 86 mg/dL (ref 65–99)
POTASSIUM: 4.2 mmol/L (ref 3.5–5.1)
SODIUM: 139 mmol/L (ref 135–145)
Total Bilirubin: 0.5 mg/dL (ref 0.3–1.2)
Total Protein: 5.1 g/dL — ABNORMAL LOW (ref 6.5–8.1)

## 2016-05-01 LAB — MAGNESIUM: Magnesium: 2.1 mg/dL (ref 1.7–2.4)

## 2016-05-01 LAB — CBC WITH DIFFERENTIAL/PLATELET
BASOS ABS: 0 10*3/uL (ref 0.0–0.1)
BASOS PCT: 0 %
EOS ABS: 0.1 10*3/uL (ref 0.0–0.7)
EOS PCT: 1 %
HEMATOCRIT: 28 % — AB (ref 36.0–46.0)
Hemoglobin: 8.9 g/dL — ABNORMAL LOW (ref 12.0–15.0)
Lymphocytes Relative: 16 %
Lymphs Abs: 1.4 10*3/uL (ref 0.7–4.0)
MCH: 26.8 pg (ref 26.0–34.0)
MCHC: 31.8 g/dL (ref 30.0–36.0)
MCV: 84.3 fL (ref 78.0–100.0)
MONO ABS: 0.7 10*3/uL (ref 0.1–1.0)
Monocytes Relative: 7 %
NEUTROS PCT: 76 %
Neutro Abs: 6.9 10*3/uL (ref 1.7–7.7)
PLATELETS: 384 10*3/uL (ref 150–400)
RBC: 3.32 MIL/uL — ABNORMAL LOW (ref 3.87–5.11)
RDW: 17.7 % — AB (ref 11.5–15.5)
WBC: 9.2 10*3/uL (ref 4.0–10.5)

## 2016-05-01 LAB — AEROBIC CULTURE W GRAM STAIN (SUPERFICIAL SPECIMEN)

## 2016-05-01 LAB — PHOSPHORUS: Phosphorus: 2.4 mg/dL — ABNORMAL LOW (ref 2.5–4.6)

## 2016-05-01 LAB — AEROBIC CULTURE  (SUPERFICIAL SPECIMEN)

## 2016-05-01 MED ORDER — TRAMADOL HCL 50 MG PO TABS
50.0000 mg | ORAL_TABLET | Freq: Four times a day (QID) | ORAL | Status: DC | PRN
  Administered 2016-05-01 – 2016-05-02 (×2): 50 mg via ORAL
  Filled 2016-05-01 (×2): qty 1

## 2016-05-01 MED ORDER — DOXYCYCLINE HYCLATE 100 MG PO TABS
100.0000 mg | ORAL_TABLET | Freq: Two times a day (BID) | ORAL | Status: DC
  Administered 2016-05-01 – 2016-05-02 (×3): 100 mg via ORAL
  Filled 2016-05-01 (×3): qty 1

## 2016-05-01 MED ORDER — ZOLPIDEM TARTRATE 5 MG PO TABS
5.0000 mg | ORAL_TABLET | Freq: Every evening | ORAL | Status: DC | PRN
  Administered 2016-05-01: 5 mg via ORAL
  Filled 2016-05-01: qty 1

## 2016-05-01 MED ORDER — BALSALAZIDE DISODIUM 750 MG PO CAPS
2250.0000 mg | ORAL_CAPSULE | Freq: Two times a day (BID) | ORAL | Status: DC
  Administered 2016-05-01 – 2016-05-02 (×3): 2250 mg via ORAL
  Filled 2016-05-01 (×4): qty 3

## 2016-05-01 MED ORDER — CEFUROXIME AXETIL 500 MG PO TABS
500.0000 mg | ORAL_TABLET | Freq: Every day | ORAL | Status: DC
  Administered 2016-05-01 – 2016-05-02 (×2): 500 mg via ORAL
  Filled 2016-05-01 (×2): qty 1

## 2016-05-01 NOTE — NC FL2 (Signed)
[place] MEDICAID FL2 LEVEL OF CARE SCREENING TOOL     IDENTIFICATION  Patient Name: [name] Birthdate: 10-12-1934 Sex: female Admission Date (Current Location): 04/27/2016  [facility] [place] Number:  Herbalist and Address:  [facility],  [place], [place]      Provider Number: [id number]  Attending Physician Name and Address:  Kerney Elbe, DO  Relative Name and Phone Number:       Current Level of Care: Hospital Recommended Level of Care: [place] Prior Approval Number:    Date Approved/Denied:   PASRR Number: [id number]  Discharge Plan: SNF    Current Diagnoses: Patient Active Problem List   Diagnosis Date Noted  . Cellulitis, leg 04/27/2016  . Cellulitis and abscess of left leg 04/27/2016  . UTI (urinary tract infection) 04/27/2016  . Chronic diastolic CHF (congestive heart failure) ([place]) 04/27/2016  . Fever of unknown origin 03/30/2016  . Fall against object 03/30/2016  . Venous ulcer of left leg ([place]) 10/18/2015  . Meningiomas, multiple ([place]) 10/18/2015  . Weakness 08/10/2015  . Malnutrition of moderate degree 07/07/2015  . S/P left TK reimplantation 07/05/2015  . S/P revision of total knee 07/05/2015  . Persistent atrial fibrillation ([place])   . Superficial thrombophlebitis 04/20/2015  . Left TK resection / spacer 03/01/2015  . Diarrhea 01/27/2015  . Atrial fibrillation ([place]) [I48.91] 12/14/2014  . Long term (current) use of anticoagulants [Z79.01] 12/14/2014  . Adhesive capsulitis of right shoulder 12/10/2014  . Swelling of right upper extremity 11/26/2014  . Acute on chronic diastolic CHF (congestive heart failure), NYHA class 1 ([place]) 10/29/2014  . Cellulitis 10/29/2014  . Anemia, iron deficiency 10/29/2014  . Infection of prosthetic left knee joint ([place]) 10/13/2014  . Benign essential HTN   . S/P left TKA 06/16/2014  . Cervical spondylosis with myelopathy 02/17/2014  .  Chronic venous stasis dermatitis of both lower extremities 09/08/2013  . Lumbar stenosis with neurogenic claudication 08/05/2013  . Hypothyroidism   . Rheumatoid arthritis ([place]) 11/22/2011  . Ulcerative colitis, chronic ([place]) 11/22/2011    Orientation RESPIRATION BLADDER Height & Weight     Self, Situation, Place  Normal Continent Weight: 140 lb 10.5 oz (63.8 kg) Height:  5\' 7"  (170.2 cm)  BEHAVIORAL SYMPTOMS/MOOD NEUROLOGICAL BOWEL NUTRITION STATUS      Continent Diet (Regular)  AMBULATORY STATUS COMMUNICATION OF NEEDS Skin   Extensive Assist Verbally  (Pressure Injury 03/30/16 Stage I -  Intact skin with non-blanchable redness of a localized area usually over a bony prominence (posterior buttock))                       Personal Care Assistance Level of Assistance  Bathing, Dressing Bathing Assistance: Limited assistance   Dressing Assistance: Limited assistance     Functional Limitations Info             SPECIAL CARE FACTORS FREQUENCY  PT (By licensed PT), OT (By licensed OT)     PT Frequency: 5 OT Frequency: 5            Contractures      Additional Factors Info  Code Status, Allergies Code Status Info: DNR Allergies Info: NKDA           Current Medications (05/01/2016):  This is the current hospital active medication list Current Facility-Administered Medications  Medication Dose Route Frequency Provider Last Rate Last Dose  . acetaminophen (TYLENOL) tablet 650 mg  650  mg Oral Q6H PRN Orson Eva, MD   650 mg at 04/30/16 2217   Or  . acetaminophen (TYLENOL) suppository 650 mg  650 mg Rectal Q6H PRN Orson Eva, MD      . amiodarone (NEXTERONE) IV bolus only 150 mg/100 mL  150 mg Intravenous Once [employer], DO      . apixaban (ELIQUIS) tablet 5 mg  5 mg Oral BID Orson Eva, MD   5 mg at 05/01/16 1027  . balsalazide (COLAZAL) capsule 2,250 mg  2,250 mg Oral BID [place], DO   2,250 mg at 05/01/16 1027  . calcium-vitamin D (OSCAL WITH  D) 500-200 MG-UNIT per tablet 1 tablet  1 tablet Oral Q breakfast Orson Eva, MD   1 tablet at 05/01/16 0813  . cefUROXime (CEFTIN) tablet 500 mg  500 mg Oral Daily [facility], DO      . celecoxib (CELEBREX) capsule 200 mg  200 mg Oral BID Orson Eva, MD   200 mg at 05/01/16 1027  . diclofenac sodium (VOLTAREN) 1 % transdermal gel 2 g  2 g Topical QID PRN Kerney Elbe, DO   2 g at 04/30/16 2218  . diltiazem (CARDIZEM CD) 24 hr capsule 180 mg  180 mg Oral Daily [facility], DO   180 mg at 05/01/16 1028  . docusate sodium (COLACE) capsule 100 mg  100 mg Oral Daily PRN Jani Gravel, MD      . doxycycline (VIBRA-TABS) tablet 100 mg  100 mg Oral Q12H [place], DO      . estradiol (ESTRACE) tablet 0.5 mg  0.5 mg Oral Daily Orson Eva, MD   0.5 mg at 05/01/16 1027  . feeding supplement (ENSURE ENLIVE) (ENSURE ENLIVE) liquid 237 mL  237 mL Oral BID BM Orson Eva, MD   237 mL at 05/01/16 1024  . ferrous sulfate tablet 325 mg  325 mg Oral Q breakfast Orson Eva, MD   325 mg at 05/01/16 0813  . folic acid (FOLVITE) tablet 2 mg  2 mg Oral Daily Orson Eva, MD   2 mg at 05/01/16 1028  . latanoprost (XALATAN) 0.005 % ophthalmic solution 1 drop  1 drop Both Eyes QHS Orson Eva, MD   1 drop at 04/30/16 2218  . levothyroxine (SYNTHROID, LEVOTHROID) tablet 75 mcg  75 mcg Oral QAC breakfast Orson Eva, MD   75 mcg at 05/01/16 [phone]  . medroxyPROGESTERone (PROVERA) tablet 2.5 mg  2.5 mg Oral QHS Orson Eva, MD   2.5 mg at 04/30/16 2217  . ondansetron (ZOFRAN) tablet 4 mg  4 mg Oral Q6H PRN Orson Eva, MD       Or  . ondansetron [facility]) injection 4 mg  4 mg Intravenous Q6H PRN Orson Eva, MD      . predniSONE (DELTASONE) tablet 10 mg  10 mg Oral Q breakfast Orson Eva, MD   10 mg at 05/01/16 [id number]     Discharge Medications: Please see discharge summary for a list of discharge medications.  Relevant Imaging Results:  Relevant Lab Results:   Additional Information SS # [id number]  Standley Brooking, LCSW

## 2016-05-01 NOTE — Progress Notes (Signed)
PROGRESS NOTE    [name]  [id number] DOB: [birth year] DOA: 04/27/2016 PCP: Hollace Kinnier, DO   Brief Narrative:  [name] is a 82 y.o. female with medical history of rheumatoid arthritis, ulcerative colitis previously treated with Remicade., atrial fibrillation, diastolic CHF, hypertension, and venous stasis dermatitis presents with a 2 day history of generalized weakness. The patient is a little slow with responses, but is able to provide a fair history. She does not recall the name of her rheumatologist. Apparently, the home health nurse came to her house today to change her dressings and noticed the patient was a little confused and very weak to the point where she could not get out of bed. In addition, the home health nurse also noted increasing drainage through her left lower extremity dressing. As result, the patient was brought to the emergency department for further evaluation. Apparently, the patient is supposed to have her Unna boots changed once per week, but the patient is unable to tell me how long she has been dealing with her venous stasis dermatitis and stasis ulceration. The patient has complained of subjective fevers and chills but denies any sore throat, coughing, nausea, vomiting, or diarrhea. She denies any chest pain, shortness of breath, abdominal pain, hematochezia, melena, dysuria, hematuria. The patient complains of left lower extremity pain, but is unable to tell me how long.  However, she states that she has had  "four surgeries" on the left leg. The patient was recently discharged from the hospital after a stay from 03/30/2016 through 04/01/2016 after sustaining a mechanical fall resulting in a laceration of her scalp. She also had atrial fibrillation with RVR at that time. The patient recently saw her primary care provider, Dr. Hollace Kinnier on 04/10/2016. At that time, there was concern for increasing drainage from her left lower extremity ulceration. The  patient was placed on cefuroxime.  In the emergency department, the patient had a low-grade temperature of 99.80F but was hemodynamically stable saturating well on room air. BMP and hepatic enzymes were unremarkable. WBC was 12.8 with hemoglobin 9.8 and platelets 357,000. Lactic acid was 1.07. Urinalysis showed TNTC WBC. Influenza PCR was negative. The patient was given ceftriaxone and clindamycin emergency department as well as 2 L normal saline. Overnight on the day of admission she went into Atrial Fibrillation with RVR and was transferred to SDU. She converted to NSR and was transitioned off Cardizem gtt and ended up becoming bradycardic and hypotensive so she was watched closely in SDU. She was bolused 1.5 Liters and improvement in her Bp. She was medically stable to be transferred to the floor however went back into Atrial Fibrillation. Case was discussed with Dr. Bronson Ing and he recommended giving the patient IV Amiodarone and changing Diltiazem to 180 mg daily. Before it was given patient converted back to NSR and IV Amiodarone was not given and patient was transferred to floor 04/29/17. Patient doing well on the floor. Updated Husband yesterday and PT recommends SNF. Transitioned patient from IV Abx to po Abx today. Awaiting Bed for Placement for SNF   Assessment & Plan:   Active Problems:   Rheumatoid arthritis ([place])   Ulcerative colitis, chronic (HCC)   Chronic venous stasis dermatitis of both lower extremities   Persistent atrial fibrillation (HCC)   Venous ulcer of left leg (HCC)   Cellulitis, leg   Cellulitis and abscess of left leg   UTI (urinary tract infection)   Chronic diastolic CHF (congestive heart failure) (HCC)  Atrial  fibrillation with RVR s/p Conversion to NSR and Bradycardia -Continue apixaban -Went into RVR  and transferred to SDU and placed on Cardizem gtt on the morning of 04/28/16 -C/w Diltiazem 180 mg q24h after discussion with Cardiology Dr. Bronson Ing -Patient  went back into Atrial Fibrillation and was going to be given IV 150 mg of Amiodarone but spontaneously converted  -Checked and patient is not on Metoprolol as an outpatient -Presently in sinus Rhythm -TSH was 0.483 -CHADSVASc = 6 -Remains in NSR   Cellulitis left lower extremity/infected venous stasis ulcer -Likely source is the patient's  Chronic venous stasis ulcer-->malodorous drainage -Cultured Ulcer and Gram Stain shows Moderate GNR, Moderate Gram + Cocci and Abundant WBC predominantly PMN's; Culture Pending -Lymphangitis streaking up from her ankle to her knee -Changed vancomycin and cefepime to po Doxycycline and resumed Home Cefuroxime per ID conversation -D/C'd IVF with NS at 75 mL/hr -WBC was 12.8 -> 9.2 toady -Wound care nurse consultation -Blood culture x2 showed NGTD at 4 days *-Of Note Patient has Hx of Infected Prosthetic Left Knee Joint and Venous Stasis Ulcer is on the Left -Was on Chronic Suppression of Daily Cefuroxime for Joint infection -Per Notes has refused [place] -Continue to Monitor Closely and Discussed with Infectious Diseases Dr. Megan Salon about Abx Reccs; Will Need to follow up with ID as an outpatient  Generalized weakness -Secondary to suspected UTI and cellulitis left lower extremity -PT evaluation Recommends SNF -D/C'd IVF at 75 mL/hr  UTI -Empiric cefepime -UA with TNTC WBC -Urine Cx showed Multiple Species Present and recommended recollection  Chronic diastolic CHF -Stable -Daily weights -Held home dose furosemide because of Blood Pressure; Will likely resume in AM -04/17/2014 echo EF 60-65%, grade 1 DD -Continue to monitor Volume Status  Hypertension but recently hypotensive -Blood pressure were lower after Cardizem gtt -C/w Diltiazem 180 mg Daily -Resume Home Lasix in AM  Ulcerative colitis -Quiescent presently -continue balsalazide  RA -continue celecoxib and prednisone  -double dose of prednisone at 10 mg for  stress dosing  Hypothyroidism -Continue Synthroid -TSH was 0.483  Meningiomas per PCP note -Followed as an outpatient by PCP and per report not a good operative candidate  DVT prophylaxis: Anticoagulated with Apixaban Code Status: FULL CODE Family Communication: No Family at bedside; Disposition Plan: SNF in AM  Consultants:   Discussed Case with Cardiologist Dr. Bronson Ing   Discussed Case with Infectious Diseases Dr. Megan Salon   Procedures: None   Antimicrobials: Anti-infectives    Start     Dose/Rate Route Frequency Ordered Stop   05/01/16 2200  cefUROXime (CEFTIN) tablet 500 mg     500 mg Oral Daily 05/01/16 1338     05/01/16 1400  doxycycline (VIBRA-TABS) tablet 100 mg     100 mg Oral Every 12 hours 05/01/16 1339 05/11/16 0959   04/30/16 1200  ceFEPIme (MAXIPIME) 1 g in dextrose 5 % 50 mL IVPB  Status:  Discontinued     1 g 100 mL/hr over 30 Minutes Intravenous Every 12 hours 04/30/16 1041 05/01/16 1339   04/27/16 2100  ceFEPIme (MAXIPIME) 2 g in dextrose 5 % 50 mL IVPB  Status:  Discontinued     2 g 100 mL/hr over 30 Minutes Intravenous Every 24 hours 04/27/16 1907 04/30/16 1041   04/27/16 2000  vancomycin (VANCOCIN) 1,250 mg in sodium chloride 0.9 % 250 mL IVPB  Status:  Discontinued     1,250 mg 166.7 mL/hr over 90 Minutes Intravenous Every 24 hours 04/27/16 1907 05/01/16 1339  04/27/16 1530  cefTRIAXone (ROCEPHIN) 1 g in dextrose 5 % 50 mL IVPB     1 g 100 mL/hr over 30 Minutes Intravenous  Once 04/27/16 1522 04/27/16 1651   04/27/16 1400  clindamycin (CLEOCIN) IVPB 600 mg     600 mg 100 mL/hr over 30 Minutes Intravenous  Once 04/27/16 1351 04/27/16 1621     Subjective: Seen this AM and doing better. Denies any active complaints but complaint to the nurse about neck pain. No nausea or vomiting. PT recommending SNF.  Objective: Vitals:   04/30/16 0505 04/30/16 1349 04/30/16 2046 05/01/16 0558  BP: (!) 146/69 132/64 139/67 138/65  Pulse: (!) 56 66 66 (!)  58  Resp: 16 18 18 18   Temp: 98.3 F (36.8 C) 98.3 F (36.8 C) 98.6 F (37 C) 98.6 F (37 C)  TempSrc: Oral Oral Oral Oral  SpO2: 100% 100% 99% 96%  Weight: 64.4 kg (141 lb 15.6 oz)   63.8 kg (140 lb 10.5 oz)  Height:        Intake/Output Summary (Last 24 hours) at 05/01/16 2009 Last data filed at 05/01/16 1900  Gross per 24 hour  Intake             1260 ml  Output                0 ml  Net             1260 ml   Filed Weights   04/29/16 0500 04/30/16 0505 05/01/16 0558  Weight: 63.5 kg (139 lb 15.9 oz) 64.4 kg (141 lb 15.6 oz) 63.8 kg (140 lb 10.5 oz)   Examination: Physical Exam:  Constitutional: NAD and appears calm and comfortable ENMT: External Ears, Nose appear normal. Grossly normal hearing.  Neck: Appears normal, supple, no cervical masses, normal ROM, no appreciable thyromegaly, no JVD Respiratory: Clear to auscultation bilaterally, no wheezing, rales, rhonchi or crackles. Normal respiratory effort and patient is not tachypenic. No accessory muscle use.  Cardiovascular: RRR no murmurs / rubs / gallops. S1 and S2 auscultated. Both legs wrapped Abdomen: Soft, non-tender, non-distended. No masses palpated. No appreciable hepatosplenomegaly. Bowel sounds positive.  GU: Deferred. Musculoskeletal: No clubbing / cyanosis of digits/nails. No joint deformity upper and lower extremities. Skin: Unable to view leg ucler and venous stasis changes today as wrapped in ACE bandages  Neurologic: CN 2-12 grossly intact with no focal deficits. Romberg sign cerebellar reflexes not assessed.  Psychiatric: Normal judgment and insight. Alert and awake.  Normal mood and appropriate affect.   Data Reviewed: I have personally reviewed following labs and imaging studies  CBC:  Recent Labs Lab 04/27/16 1405 04/28/16 1905 04/29/16 0357 04/30/16 0505 05/01/16 0459  WBC 12.8* 12.7* 11.6* 8.9 9.2  NEUTROABS 10.6* 11.4* 9.6* 6.8 6.9  HGB 9.8* 8.3* 8.6* 8.7* 8.9*  HCT 31.3* 27.1* 27.7* 27.9*  28.0*  MCV 85.5 83.9 85.5 85.6 84.3  PLT 357 349 325 380 0000000   Basic Metabolic Panel:  Recent Labs Lab 04/27/16 1405 04/28/16 0540 04/29/16 0357 04/30/16 0505 05/01/16 0459  NA 140 139 141 139 139  K 4.7 4.3 3.6 3.8 4.2  CL 109 113* 116* 115* 114*  CO2 23 21* 21* 19* 21*  GLUCOSE 86 90 94 88 86  BUN 31* 24* 25* 24* 22*  CREATININE 0.89 0.82 0.78 0.81 0.99  CALCIUM 8.6* 7.9* 7.7* 8.0* 8.2*  MG  --   --  1.9 1.9 2.1  PHOS  --   --  2.2* 2.3* 2.4*   GFR: Estimated Creatinine Clearance: 44.8 mL/min (by C-G formula based on SCr of 0.99 mg/dL). Liver Function Tests:  Recent Labs Lab 04/27/16 1405 04/28/16 0540 04/29/16 0357 04/30/16 0505 05/01/16 0459  AST 22 21 22 21 25   ALT 14 12* 12* 12* 13*  ALKPHOS 132* 107 101 97 101  BILITOT 0.7 0.8 0.6 0.5 0.5  PROT 5.7* 5.3* 4.9* 5.0* 5.1*  ALBUMIN 2.7* 2.3* 2.2* 2.1* 2.1*   No results for input(s): LIPASE, AMYLASE in the last 168 hours. No results for input(s): AMMONIA in the last 168 hours. Coagulation Profile: No results for input(s): INR, PROTIME in the last 168 hours. Cardiac Enzymes:  Recent Labs Lab 04/27/16 1405  TROPONINI <0.03   BNP (last 3 results) No results for input(s): PROBNP in the last 8760 hours. HbA1C: No results for input(s): HGBA1C in the last 72 hours. CBG: No results for input(s): GLUCAP in the last 168 hours. Lipid Profile: No results for input(s): CHOL, HDL, LDLCALC, TRIG, CHOLHDL, LDLDIRECT in the last 72 hours. Thyroid Function Tests: No results for input(s): TSH, T4TOTAL, FREET4, T3FREE, THYROIDAB in the last 72 hours. Anemia Panel: No results for input(s): VITAMINB12, FOLATE, FERRITIN, TIBC, IRON, RETICCTPCT in the last 72 hours. Sepsis Labs:  Recent Labs Lab 04/27/16 1419  LATICACIDVEN 1.07    Recent Results (from the past 240 hour(s))  Culture, blood (routine x 2)     Status: None (Preliminary result)   Collection Time: 04/27/16  2:05 PM  Result Value Ref Range Status    Specimen Description BLOOD RIGHT WRIST  Final   Special Requests BOTTLES DRAWN AEROBIC AND ANAEROBIC 6CC  Final   Culture   Final    NO GROWTH 4 DAYS Performed at [facility], [place] [address] [place], [place] [address]    Report Status PENDING  Incomplete  Culture, Urine     Status: Abnormal   Collection Time: 04/27/16  2:05 PM  Result Value Ref Range Status   Specimen Description URINE, CLEAN CATCH  Final   Special Requests NONE  Final   Culture MULTIPLE SPECIES PRESENT, SUGGEST RECOLLECTION (A)  Final   Report Status 04/29/2016 FINAL  Final  Culture, blood (routine x 2)     Status: None (Preliminary result)   Collection Time: 04/27/16  2:55 PM  Result Value Ref Range Status   Specimen Description BLOOD LEFT ANTECUBITAL  Final   Special Requests BOTTLES DRAWN AEROBIC AND ANAEROBIC 5 CC EA  Final   Culture   Final    NO GROWTH 4 DAYS Performed at [facility], [place] [address] [place], [place] [address]    Report Status PENDING  Incomplete  MRSA PCR Screening     Status: None   Collection Time: 04/27/16  6:43 PM  Result Value Ref Range Status   MRSA by PCR NEGATIVE NEGATIVE Final    Comment:        The GeneXpert MRSA Assay (FDA approved for NASAL specimens only), is one component of a comprehensive MRSA colonization surveillance program. It is not intended to diagnose MRSA infection nor to guide or monitor treatment for MRSA infections.   Aerobic Culture (superficial specimen)     Status: None   Collection Time: 04/28/16  8:40 AM  Result Value Ref Range Status   Specimen Description LEG LEFT  Final   Special Requests NONE  Final   Gram Stain   Final    ABUNDANT WBC PRESENT, PREDOMINANTLY PMN MODERATE  GRAM NEGATIVE RODS MODERATE GRAM POSITIVE COCCI IN PAIRS Performed at [facility], [place] [address] [place], [place] [address]    Culture MULTIPLE ORGANISMS PRESENT, NONE PREDOMINANT  Final   Report Status 05/01/2016 FINAL  Final    Radiology  Studies: No results found.   Scheduled Meds: . amiodarone  150 mg Intravenous Once  . apixaban  5 mg Oral BID  . balsalazide  2,250 mg Oral BID  . calcium-vitamin D  1 tablet Oral Q breakfast  . cefUROXime  500 mg Oral Daily  . celecoxib  200 mg Oral BID  . diltiazem  180 mg Oral Daily  . doxycycline  100 mg Oral Q12H  . estradiol  0.5 mg Oral Daily  . feeding supplement (ENSURE ENLIVE)  237 mL Oral BID BM  . ferrous sulfate  325 mg Oral Q breakfast  . folic acid  2 mg Oral Daily  . latanoprost  1 drop Both Eyes QHS  . levothyroxine  75 mcg Oral QAC breakfast  . medroxyPROGESTERone  2.5 mg Oral QHS  . predniSONE  10 mg Oral Q breakfast   Continuous Infusions:   LOS: 4 days   Kerney Elbe, DO [facility] Pager [phone]  If 7PM-7AM, please contact night-coverage [email or web address] Password [email or web address] 05/01/2016, 8:09 PM

## 2016-05-02 LAB — COMPREHENSIVE METABOLIC PANEL
ALK PHOS: 105 U/L (ref 38–126)
ALT: 15 U/L (ref 14–54)
ANION GAP: 5 (ref 5–15)
AST: 27 U/L (ref 15–41)
Albumin: 2.1 g/dL — ABNORMAL LOW (ref 3.5–5.0)
BUN: 25 mg/dL — ABNORMAL HIGH (ref 6–20)
CALCIUM: 8.4 mg/dL — AB (ref 8.9–10.3)
CO2: 21 mmol/L — ABNORMAL LOW (ref 22–32)
CREATININE: 0.87 mg/dL (ref 0.44–1.00)
Chloride: 112 mmol/L — ABNORMAL HIGH (ref 101–111)
Glucose, Bld: 77 mg/dL (ref 65–99)
Potassium: 4.6 mmol/L (ref 3.5–5.1)
Sodium: 138 mmol/L (ref 135–145)
TOTAL PROTEIN: 5.1 g/dL — AB (ref 6.5–8.1)
Total Bilirubin: 0.4 mg/dL (ref 0.3–1.2)

## 2016-05-02 LAB — CBC WITH DIFFERENTIAL/PLATELET
BASOS PCT: 0 %
Basophils Absolute: 0 10*3/uL (ref 0.0–0.1)
EOS PCT: 2 %
Eosinophils Absolute: 0.1 10*3/uL (ref 0.0–0.7)
HCT: 29.9 % — ABNORMAL LOW (ref 36.0–46.0)
HEMOGLOBIN: 9.7 g/dL — AB (ref 12.0–15.0)
Lymphocytes Relative: 19 %
Lymphs Abs: 1.4 10*3/uL (ref 0.7–4.0)
MCH: 26.6 pg (ref 26.0–34.0)
MCHC: 32.4 g/dL (ref 30.0–36.0)
MCV: 81.9 fL (ref 78.0–100.0)
MONO ABS: 0.6 10*3/uL (ref 0.1–1.0)
Monocytes Relative: 8 %
NEUTROS ABS: 5.3 10*3/uL (ref 1.7–7.7)
NEUTROS PCT: 71 %
Platelets: 339 10*3/uL (ref 150–400)
RBC: 3.65 MIL/uL — ABNORMAL LOW (ref 3.87–5.11)
RDW: 17.3 % — ABNORMAL HIGH (ref 11.5–15.5)
WBC: 7.4 10*3/uL (ref 4.0–10.5)

## 2016-05-02 LAB — CULTURE, BLOOD (ROUTINE X 2)
CULTURE: NO GROWTH
Culture: NO GROWTH

## 2016-05-02 LAB — PHOSPHORUS: PHOSPHORUS: 2.9 mg/dL (ref 2.5–4.6)

## 2016-05-02 LAB — MAGNESIUM: MAGNESIUM: 2 mg/dL (ref 1.7–2.4)

## 2016-05-02 MED ORDER — ENSURE ENLIVE PO LIQD: 237.0000 mL | Freq: Two times a day (BID) | ORAL | 12 refills | Status: AC

## 2016-05-02 MED ORDER — DIAZEPAM 5 MG PO TABS: 5.0000 mg | ORAL_TABLET | Freq: Two times a day (BID) | ORAL | 0 refills | Status: AC

## 2016-05-02 MED ORDER — DOXYCYCLINE HYCLATE 100 MG PO TABS: 100.0000 mg | ORAL_TABLET | Freq: Two times a day (BID) | ORAL | 0 refills | Status: DC

## 2016-05-02 MED ORDER — DILTIAZEM HCL ER COATED BEADS 180 MG PO CP24: 180.0000 mg | ORAL_CAPSULE | Freq: Every day | ORAL | 0 refills | Status: AC

## 2016-05-02 MED ORDER — TRAMADOL HCL 50 MG PO TABS: 50.0000 mg | ORAL_TABLET | Freq: Four times a day (QID) | ORAL | 0 refills | Status: DC | PRN

## 2016-05-02 NOTE — Progress Notes (Signed)
Physical Therapy Treatment Patient Details Name: [name] MRN: [id number] DOB: 1934-09-13 Today's Date: 05/02/2016    History of Present Illness Patient is a 82 y.o. female with a PMH significant for atrial fibrillation, CHF, hypothyroidism, RA, and UC who presented to ED on 04/27/16 with cellulitis left lower extremity/infected venous stasis ulcer with drainage and UTI    PT Comments    Assisted OOB to [facility] + 2.  Attempted amb however very difficulty to weight shift enough eith LE to advance.  Took a few steps forward and back.  Unsteady.    Follow Up Recommendations  SNF ([facility])     Equipment Recommendations  None recommended by PT    Recommendations for Other Services       Precautions / Restrictions Precautions Precautions: Fall Precaution Comments: leg wounds, weeping Restrictions Weight Bearing Restrictions: No    Mobility  Bed Mobility Overal bed mobility: Needs Assistance Bed Mobility: Supine to Sit     Supine to sit: Mod assist     General bed mobility comments: assist for trunk and legs plus bed pad to complete scooting to EOB  Transfers Overall transfer level: Needs assistance Equipment used: Rolling walker (2 wheeled) Transfers: Sit to/from [facility] Sit to Stand: Max assist;+2 physical assistance;+2 safety/equipment;From elevated surface Stand pivot transfers: Max assist;+2 safety/equipment;From elevated surface;+2 physical assistance       General transfer comment: 75% VC's on proper tech and direction.  Assisted off bed to [facility] then off/on BSC twice.    Ambulation/Gait Ambulation/Gait assistance: Max assist;+2 physical assistance;+2 safety/equipment Ambulation Distance (Feet): 1 Feet (4 steps) Assistive device: Rolling walker (2 wheeled) Gait Pattern/deviations: Step-to pattern;Decreased step length - right;Decreased step length - left Gait velocity: decreased   General Gait Details: very limited amb  distance/ability.  Difficulty using walker due to B hand RA.   Stairs            Wheelchair Mobility    Modified Rankin (Stroke Patients Only)       Balance                                    Cognition Arousal/Alertness: Awake/alert Behavior During Therapy: WFL for tasks assessed/performed Overall Cognitive Status: Within Functional Limits for tasks assessed                      Exercises      General Comments        Pertinent Vitals/Pain Pain Assessment: Faces Faces Pain Scale: Hurts a little bit Pain Location: all over aches and pains Pain Descriptors / Indicators: Aching;Discomfort;Grimacing;Guarding Pain Intervention(s): Monitored during session;Repositioned    Home Living                      Prior Function            PT Goals (current goals can now be found in the care plan section) Progress towards PT goals: Progressing toward goals    Frequency    Min 3X/week      PT Plan Current plan remains appropriate    Co-evaluation             End of Session Equipment Utilized During Treatment: Gait belt Activity Tolerance: Patient limited by fatigue Patient left: in chair;with call bell/phone within reach Nurse Communication: Mobility status PT Visit Diagnosis: Unsteadiness on feet (R26.81)  Time: [id number] PT Time Calculation (min) (ACUTE ONLY): 30 min  Charges:  $Gait Training: 8-22 mins $Therapeutic Activity: 8-22 mins                    G Codes:       Rica Koyanagi  PTA [facility]  Acute  Rehab Pager      [phone]

## 2016-05-02 NOTE — Care Management Important Message (Signed)
Important Message  Patient Details  Name: ELIZE MOURE MRN: [id number] Date of Birth: August 22, 1934   Medicare Important Message Given:  Yes    Kerin Salen 05/02/2016, 12:30 PMImportant Message  Patient Details  Name: TEINA CAMPANA MRN: [id number] Date of Birth: [birth year]   Medicare Important Message Given:  Yes    Kerin Salen 05/02/2016, 12:30 PM

## 2016-05-02 NOTE — Discharge Summary (Signed)
Physician Discharge Summary  [name] [id number] DOB: [birth year] DOA: 04/27/2016  PCP: Hollace Kinnier, DO  Admit date: 04/27/2016 Discharge date: 05/02/2016  Admitted From: Home Disposition:  SNF  Recommendations for Outpatient Follow-up:  1. Follow up with PCP in 1-2 weeks 2. Follow up with Infectious Diseases Dr. Linus Salmons for wound care evaluation 3. Follow up with Cardiology Atrial Fibrillation Clinic 4. Please obtain BMP/CBC in one week  Home Health: No Equipment/Devices: None  Discharge Condition: Stable CODE STATUS: DO NOT RESUSCITATE Diet recommendation: Heart Healthy   Brief/Interim Summary: [name] Lundrigan Hulbertis a 82 y.o.femalewith medical history of rheumatoid arthritis, ulcerative colitis previously treated with Remicade, Atrial fibrillation, Diastolic CHF, Hypertension, and venous stasis dermatitis presents with a 2 day history of generalized weakness. The patient is a little slow with responses, but is able to provide a fair history. She does not recall the name of her rheumatologist. Apparently, the home health nurse came to her house today to change her dressings and noticed the patient was a little confused and very weak to the point where she could not get out of bed. In addition, the home health nurse also noted increasing drainage through her left lower extremity dressing. As result, the patient was brought to the emergency department for further evaluation. Apparently, the patient is supposed to have her Unna boots changed once per week, but the patient is unable to tell me how long she has been dealing with her venous stasis dermatitis and stasis ulceration. The patient has complained of subjective fevers and chills but denies any sore throat, coughing, nausea, vomiting, or diarrhea. She denies any chest pain, shortness of breath, abdominal pain, hematochezia, melena, dysuria, hematuria. The patient complains of left lower extremity pain, but is unable to tell me how  long. However,she states that she has had "four surgeries"on the left leg. The patient was recently discharged from the hospital after a stay from 03/30/2016 through 04/01/2016 after sustaining a mechanical fall resulting in a laceration of her scalp. She also had atrial fibrillation with RVR at that time. The patient recently saw her primary care provider, Dr. Hollace Kinnier on 04/10/2016. At that time, there was concern for increasing drainage from her left lower extremity ulceration. The patient was placed on cefuroxime.  In the emergency department, the patient had a low-grade temperature of 99.49F but was hemodynamically stable saturating well on room air. BMP and hepatic enzymes were unremarkable. WBC was 12.8 with hemoglobin 9.8 and platelets 357,000. Lactic acid was 1.07. Urinalysis showed TNTC WBC.Influenza PCR was negative. The patient was given ceftriaxone and clindamycin emergency department as well as 2 L normal saline. Overnight on the day of admission she went into Atrial Fibrillation with RVR and was transferred to SDU. She converted to NSR and was transitioned off Cardizem gtt and ended up becoming bradycardic and hypotensive so she was watched closely in SDU. She was bolused 1.5 Liters and improvement in her Bp. She was medically stable to be transferred to the floor however went back into Atrial Fibrillation. Case was discussed with Dr. Bronson Ing and he recommended giving the patient IV Amiodarone and changing Diltiazem to 180 mg daily. Before it was given patient converted back to NSR and IV Amiodarone was not given and patient was transferred to floor 04/29/17. She did well on the floor and transitioned off of IV Abx to po Abx after discussion with Dr. Megan Salon. Patient was evaluated by PT and recommended SNF. At this time patient was deemed medically stable to  be D/C'd to SNF and follow up with PCP, Infectious Diseases, and Cardiology as an outpatient.   Discharge Diagnoses:  Active  Problems:   Rheumatoid arthritis ([place])   Ulcerative colitis, chronic (HCC)   Chronic venous stasis dermatitis of both lower extremities   Persistent atrial fibrillation (HCC)   Venous ulcer of left leg (HCC)   Cellulitis, leg   Cellulitis and abscess of left leg   UTI (urinary tract infection)   Chronic diastolic CHF (congestive heart failure) (HCC)  Atrial fibrillation with RVR s/p Conversion to NSR and Bradycardia -Continue Apixaban -Went into RVR  and transferred to SDU and placed on Cardizem gtt on the morning of 04/28/16 -C/w Diltiazem 180 mg q24h after discussion with Cardiology Dr. Bronson Ing -Patient went back into Atrial Fibrillation and was going to be given IV 150 mg of Amiodarone but spontaneously converted  -Checked and patient is not on Metoprolol as an outpatient -TSH was 0.483 -CHADSVASc = 6 -Remains in NSR  -Outpatient follow up with Cardiology Atrial Fibrillation Clinic   Cellulitis left lower extremity/infected venous stasis ulcer -Likely source is the patient's Chronic venous stasis ulcer-->malodorous drainage -Cultured Ulcer and Gram Stain shows Moderate GNR, Moderate Gram + Cocci and Abundant WBC predominantly PMN's; Culture Pending -Lymphangitis streaking up from her ankle to her knee -Changed vancomycin and cefepime to po Doxycycline and resumed Home Cefuroxime per ID conversation -D/C'd IVF with NS at 75 mL/hr -WBC was 12.8 -> 7.4 today -Wound care nurse consultation at SNF -Blood culture x2 showed NGTD at 5 days *-Of Note Patient has Hx of Infected Prosthetic Left Knee Joint and Venous Stasis Ulcer is on the Left -Was on Chronic Suppression of Daily Cefuroxime for Joint infection -Per Notes has refused [place] -Continue to Monitor Closely and Discussed with Infectious Diseases Dr. Megan Salon about Abx Reccs; Will Need to follow up with ID as an outpatient  Generalized Weakness -Secondary to suspected UTI and cellulitis left lower extremity -PT  evaluation Recommends SNF -D/C'd IVF at 75 mL/hr  UTI -Empiric Cefepime D/C'd; C/w Cefuroxime 500 mg po Daily -UA with TNTC WBC -Urine Cx showed Multiple Species Present and recommended recollection  Chronic diastolic CHF -Stable -Daily weights -Held home dose furosemide because of Blood Pressure; ok to Resume -04/17/2014 echo EF 60-65%, grade 1 DD -Continue to monitor Volume Status  Hypertension -Blood pressure were lower after Cardizem gtt -C/w Diltiazem 180 mg Daily -Resume Home Lasix in AM -Follow closely   Ulcerativecolitis -Quiescent presently -Continue Balsalazide 2,250 mg po BID  RA -Continue Celecoxib and Prednisone  -Doubled dose of prednisone at 10 mg for stress dosing; Will need Dosing of Steroids re-evaluated at SNF  Hypothyroidism -Continue Synthroid -TSH was 0.483 -Repeat TSH as an outpatient   Meningiomas per PCP note -Followed as an outpatient by PCP and per report not a good operative candidate  Discharge Instructions  Discharge Instructions    Call MD for:  difficulty breathing, headache or visual disturbances    Complete by:  As directed    Call MD for:  extreme fatigue    Complete by:  As directed    Call MD for:  persistant dizziness or light-headedness    Complete by:  As directed    Call MD for:  persistant nausea and vomiting    Complete by:  As directed    Call MD for:  redness, tenderness, or signs of infection (pain, swelling, redness, odor or green/yellow discharge around incision site)    Complete by:  As directed    Call MD for:  severe uncontrolled pain    Complete by:  As directed    Call MD for:  temperature >100.4    Complete by:  As directed    Diet - low sodium heart healthy    Complete by:  As directed    Discharge instructions    Complete by:  As directed    Follow up Care at SNF and follow up with Infectious Diseases as an outpatient.   Discharge wound care:    Complete by:  As directed    Remove leg wraps,  Cleanse BLE with soap and water and pat dry, to LLE lateral calf wound, apply Aquacell Ag+, cover with ABD, wrap with kerlix and 4" ace wrap starting at foot and working in a spiral fashion to just below knee. Wrap right leg the same EXCEPT no Aqualcell needed on right leg. Perform daily.  Apply foam to coccyx area where pt has a healed ulcer change q 3 days or prn soiling.  Paint unstageable wound distal to left knee with betadine and leave open to air, perform BID   Increase activity slowly    Complete by:  As directed      Allergies as of 05/02/2016   No Known Allergies     Medication List    TAKE these medications   apixaban 5 MG Tabs tablet Commonly known as:  ELIQUIS Take 1 tablet (5 mg total) by mouth 2 (two) times daily.   balsalazide 750 MG capsule Commonly known as:  COLAZAL Take 2,250 mg by mouth 2 (two) times daily.   bismuth subsalicylate [id number] suspension Commonly known as:  PEPTO BISMOL Take 15 mLs by mouth 4 (four) times daily -  before meals and at bedtime.   Calcium + D3 600-200 MG-UNIT Tabs Take 1 tablet by mouth daily with breakfast.   cefUROXime 500 MG tablet Commonly known as:  CEFTIN Take 1 tablet (500 mg total) by mouth daily.   celecoxib 200 MG capsule Commonly known as:  CELEBREX Take 1 capsule (200 mg total) by mouth 2 (two) times daily.   collagenase ointment Commonly known as:  SANTYL Apply 1 application topically once a week.   diazepam 5 MG tablet Commonly known as:  VALIUM Take 1 tablet (5 mg total) by mouth every 12 (twelve) hours.   diltiazem 180 MG 24 hr capsule Commonly known as:  CARDIZEM CD Take 1 capsule (180 mg total) by mouth daily. Start taking on:  05/03/2016   docusate sodium 100 MG capsule Commonly known as:  COLACE Take 100 mg by mouth 2 (two) times daily as needed for mild constipation.   doxycycline 100 MG tablet Commonly known as:  VIBRA-TABS Take 1 tablet (100 mg total) by mouth every 12 (twelve) hours.    estradiol 0.5 MG tablet Commonly known as:  ESTRACE Take 0.5 mg by mouth daily.   feeding supplement (ENSURE ENLIVE) Liqd Take 237 mLs by mouth 2 (two) times daily between meals.   ferrous sulfate 325 (65 FE) MG tablet Take 325 mg by mouth daily with breakfast.   folic acid 1 MG tablet Commonly known as:  FOLVITE Take 2 mg by mouth daily.   furosemide 40 MG tablet Commonly known as:  LASIX Take 40 mg by mouth daily.   guaiFENesin 600 MG 12 hr tablet Commonly known as:  MUCINEX Take 600 mg by mouth 2 (two) times daily.   latanoprost 0.005 % ophthalmic solution Commonly known  as:  XALATAN Place 1 drop into both eyes at bedtime.   levothyroxine 75 MCG tablet Commonly known as:  SYNTHROID, LEVOTHROID Take 1 tablet (75 mcg total) by mouth daily.   medroxyPROGESTERone 2.5 MG tablet Commonly known as:  PROVERA Take 1 tablet (2.5 mg total) by mouth at bedtime.   traMADol 50 MG tablet Commonly known as:  ULTRAM Take 1 tablet (50 mg total) by mouth every 6 (six) hours as needed for moderate pain (or Headache unrelieved by tylenol).      Follow-up Information    [facility]. Schedule an appointment as soon as possible for a visit in 1 week(s).   Specialty:  Cardiology Why:  please call to make appointment  Contact information: [address] [id number] [place] [address], [name], DO Follow up in 1 week(s).   Specialty:  Geriatric Medicine Why:  Follow up within 1 week Contact information: [place] [place] [address] [id number]        Scharlene Gloss, MD. Call in 1 week(s).   Specialty:  Infectious Diseases Why:  1 week Contact information: [address] [address] [phone]          No Known Allergies  Consultations:  Infectious Diseases Discussion with Dr. Megan Salon over the phone  Cardiology Discussion with Dr. Bronson Ing over the  phone  Procedures/Studies: No results found.  Subjective:   Discharge Exam: Vitals:   05/02/16 0540 05/02/16 0922  BP: (!) 130/54 128/65  Pulse: 64   Resp: 18   Temp: 99 F (37.2 C)    Vitals:   05/01/16 0558 05/01/16 2207 05/02/16 0540 05/02/16 0922  BP: 138/65 (!) 145/60 (!) 130/54 128/65  Pulse: (!) 58 69 64   Resp: 18 18 18    Temp: 98.6 F (37 C) 99 F (37.2 C) 99 F (37.2 C)   TempSrc: Oral Oral Oral   SpO2: 96% 98% 97%   Weight: 63.8 kg (140 lb 10.5 oz)  63.7 kg (140 lb 6.9 oz)   Height:       General: Pt is alert, awake, not in acute distress Cardiovascular: RRR, S1/S2 +, no rubs, no gallops Respiratory: CTA bilaterally, no wheezing, no rhonchi; Patient not tachypenic or using any accessory muscles  Abdominal: Soft, NT, ND, bowel sounds + Extremities: Left Leg wrapped and Right leg also has venous stasis changes.  The results of significant diagnostics from this hospitalization (including imaging, microbiology, ancillary and laboratory) are listed below for reference.    Anti-infectives    Start     Dose/Rate Route Frequency Ordered Stop   05/02/16 0000  doxycycline (VIBRA-TABS) 100 MG tablet     100 mg Oral Every 12 hours 05/02/16 1345     05/01/16 2200  cefUROXime (CEFTIN) tablet 500 mg     500 mg Oral Daily 05/01/16 1338     05/01/16 1400  doxycycline (VIBRA-TABS) tablet 100 mg     100 mg Oral Every 12 hours 05/01/16 1339 05/11/16 0959   04/30/16 1200  ceFEPIme (MAXIPIME) 1 g in dextrose 5 % 50 mL IVPB  Status:  Discontinued     1 g 100 mL/hr over 30 Minutes Intravenous Every 12 hours 04/30/16 1041 05/01/16 1339   04/27/16 2100  ceFEPIme (MAXIPIME) 2 g in dextrose 5 % 50 mL IVPB  Status:  Discontinued     2 g 100 mL/hr over 30 Minutes Intravenous Every 24  hours 04/27/16 1907 04/30/16 1041   04/27/16 2000  vancomycin (VANCOCIN) 1,250 mg in sodium chloride 0.9 % 250 mL IVPB  Status:  Discontinued     1,250 mg 166.7 mL/hr over 90 Minutes Intravenous Every  24 hours 04/27/16 1907 05/01/16 1339   04/27/16 1530  cefTRIAXone (ROCEPHIN) 1 g in dextrose 5 % 50 mL IVPB     1 g 100 mL/hr over 30 Minutes Intravenous  Once 04/27/16 1522 04/27/16 1651   04/27/16 1400  clindamycin (CLEOCIN) IVPB 600 mg     600 mg 100 mL/hr over 30 Minutes Intravenous  Once 04/27/16 1351 04/27/16 1621     Microbiology: Recent Results (from the past 240 hour(s))  Culture, blood (routine x 2)     Status: None   Collection Time: 04/27/16  2:05 PM  Result Value Ref Range Status   Specimen Description BLOOD RIGHT WRIST  Final   Special Requests BOTTLES DRAWN AEROBIC AND ANAEROBIC 6CC  Final   Culture   Final    NO GROWTH 5 DAYS Performed at [facility], [place] [address], [place], [place] [address]    Report Status 05/02/2016 FINAL  Final  Culture, Urine     Status: Abnormal   Collection Time: 04/27/16  2:05 PM  Result Value Ref Range Status   Specimen Description URINE, CLEAN CATCH  Final   Special Requests NONE  Final   Culture MULTIPLE SPECIES PRESENT, SUGGEST RECOLLECTION (A)  Final   Report Status 04/29/2016 FINAL  Final  Culture, blood (routine x 2)     Status: None   Collection Time: 04/27/16  2:55 PM  Result Value Ref Range Status   Specimen Description BLOOD LEFT ANTECUBITAL  Final   Special Requests BOTTLES DRAWN AEROBIC AND ANAEROBIC 5 CC EA  Final   Culture   Final    NO GROWTH 5 DAYS Performed at [facility], [place] [address], [place], [place] [address]    Report Status 05/02/2016 FINAL  Final  MRSA PCR Screening     Status: None   Collection Time: 04/27/16  6:43 PM  Result Value Ref Range Status   MRSA by PCR NEGATIVE NEGATIVE Final    Comment:        The GeneXpert MRSA Assay (FDA approved for NASAL specimens only), is one component of a comprehensive MRSA colonization surveillance program. It is not intended to diagnose MRSA infection nor to guide or monitor treatment for MRSA infections.   Aerobic Culture (superficial  specimen)     Status: None   Collection Time: 04/28/16  8:40 AM  Result Value Ref Range Status   Specimen Description LEG LEFT  Final   Special Requests NONE  Final   Gram Stain   Final    ABUNDANT WBC PRESENT, PREDOMINANTLY PMN MODERATE GRAM NEGATIVE RODS MODERATE GRAM POSITIVE COCCI IN PAIRS Performed at [facility], [address] [place], [place] [address]    Culture MULTIPLE ORGANISMS PRESENT, NONE PREDOMINANT  Final   Report Status 05/01/2016 FINAL  Final    Labs: BNP (last 3 results) No results for input(s): BNP in the last 8760 hours. Basic Metabolic Panel:  Recent Labs Lab 04/28/16 0540 04/29/16 0357 04/30/16 0505 05/01/16 0459 05/02/16 0548  NA 139 141 139 139 138  K 4.3 3.6 3.8 4.2 4.6  CL 113* 116* 115* 114* 112*  CO2 21* 21* 19* 21* 21*  GLUCOSE 90 94 88 86 77  BUN 24* 25* 24* 22* 25*  CREATININE 0.82 0.78 0.81 0.99 0.87  CALCIUM 7.9* 7.7* 8.0* 8.2* 8.4*  MG  --  1.9 1.9 2.1 2.0  PHOS  --  2.2* 2.3* 2.4* 2.9   Liver Function Tests:  Recent Labs Lab 04/28/16 0540 04/29/16 0357 04/30/16 0505 05/01/16 0459 05/02/16 0548  AST 21 22 21 25 27   ALT 12* 12* 12* 13* 15  ALKPHOS 107 101 97 101 105  BILITOT 0.8 0.6 0.5 0.5 0.4  PROT 5.3* 4.9* 5.0* 5.1* 5.1*  ALBUMIN 2.3* 2.2* 2.1* 2.1* 2.1*   No results for input(s): LIPASE, AMYLASE in the last 168 hours. No results for input(s): AMMONIA in the last 168 hours. CBC:  Recent Labs Lab 04/28/16 1905 04/29/16 0357 04/30/16 0505 05/01/16 0459 05/02/16 0548  WBC 12.7* 11.6* 8.9 9.2 7.4  NEUTROABS 11.4* 9.6* 6.8 6.9 5.3  HGB 8.3* 8.6* 8.7* 8.9* 9.7*  HCT 27.1* 27.7* 27.9* 28.0* 29.9*  MCV 83.9 85.5 85.6 84.3 81.9  PLT 349 325 380 384 339   Cardiac Enzymes:  Recent Labs Lab 04/27/16 1405  TROPONINI <0.03   BNP: Invalid input(s): POCBNP CBG: No results for input(s): GLUCAP in the last 168 hours. D-Dimer No results for input(s): DDIMER in the last 72 hours. Hgb A1c No results for  input(s): HGBA1C in the last 72 hours. Lipid Profile No results for input(s): CHOL, HDL, LDLCALC, TRIG, CHOLHDL, LDLDIRECT in the last 72 hours. Thyroid function studies No results for input(s): TSH, T4TOTAL, T3FREE, THYROIDAB in the last 72 hours.  Invalid input(s): FREET3 Anemia work up No results for input(s): VITAMINB12, FOLATE, FERRITIN, TIBC, IRON, RETICCTPCT in the last 72 hours. Urinalysis    Component Value Date/Time   COLORURINE YELLOW 04/27/2016 1405   APPEARANCEUR CLOUDY (A) 04/27/2016 1405   LABSPEC 1.015 04/27/2016 1405   PHURINE 7.0 04/27/2016 1405   GLUCOSEU NEGATIVE 04/27/2016 1405   HGBUR MODERATE (A) 04/27/2016 1405   BILIRUBINUR NEGATIVE 04/27/2016 1405   KETONESUR NEGATIVE 04/27/2016 1405   PROTEINUR 30 (A) 04/27/2016 1405   UROBILINOGEN 0.2 10/13/2014 1505   NITRITE NEGATIVE 04/27/2016 1405   LEUKOCYTESUR LARGE (A) 04/27/2016 1405   Sepsis Labs Invalid input(s): PROCALCITONIN,  WBC,  LACTICIDVEN Microbiology Recent Results (from the past 240 hour(s))  Culture, blood (routine x 2)     Status: None   Collection Time: 04/27/16  2:05 PM  Result Value Ref Range Status   Specimen Description BLOOD RIGHT WRIST  Final   Special Requests BOTTLES DRAWN AEROBIC AND ANAEROBIC 6CC  Final   Culture   Final    NO GROWTH 5 DAYS Performed at [facility], [place] [address], [place], [place] [address]    Report Status 05/02/2016 FINAL  Final  Culture, Urine     Status: Abnormal   Collection Time: 04/27/16  2:05 PM  Result Value Ref Range Status   Specimen Description URINE, CLEAN CATCH  Final   Special Requests NONE  Final   Culture MULTIPLE SPECIES PRESENT, SUGGEST RECOLLECTION (A)  Final   Report Status 04/29/2016 FINAL  Final  Culture, blood (routine x 2)     Status: None   Collection Time: 04/27/16  2:55 PM  Result Value Ref Range Status   Specimen Description BLOOD LEFT ANTECUBITAL  Final   Special Requests BOTTLES DRAWN AEROBIC AND ANAEROBIC 5 CC EA   Final   Culture   Final    NO GROWTH 5 DAYS Performed at [facility], [place] [address], [place], [place] [address]  Report Status 05/02/2016 FINAL  Final  MRSA PCR Screening     Status: None   Collection Time: 04/27/16  6:43 PM  Result Value Ref Range Status   MRSA by PCR NEGATIVE NEGATIVE Final    Comment:        The GeneXpert MRSA Assay (FDA approved for NASAL specimens only), is one component of a comprehensive MRSA colonization surveillance program. It is not intended to diagnose MRSA infection nor to guide or monitor treatment for MRSA infections.   Aerobic Culture (superficial specimen)     Status: None   Collection Time: 04/28/16  8:40 AM  Result Value Ref Range Status   Specimen Description LEG LEFT  Final   Special Requests NONE  Final   Gram Stain   Final    ABUNDANT WBC PRESENT, PREDOMINANTLY PMN MODERATE GRAM NEGATIVE RODS MODERATE GRAM POSITIVE COCCI IN PAIRS Performed at [facility], [address] [place], [place] [address]    Culture MULTIPLE ORGANISMS PRESENT, NONE PREDOMINANT  Final   Report Status 05/01/2016 FINAL  Final   Time coordinating discharge: Over 30 minutes  SIGNED:  Kerney Elbe, DO [facility] 05/02/2016, 2:17 PM Pager [phone]  If 7PM-7AM, please contact night-coverage [email or web address] Password [email or web address]

## 2016-05-02 NOTE — Consult Note (Addendum)
   [facility] Inpatient Consult   05/02/2016  [name] 05-03-1934 [id number]    Patient screened for potential [facility] Care Management services. Chart reviewed. Confirmed eligibility with [facility] office.  Noted current discharge plan is for SNF. Confirmed disposition with inpatient LCSW.  There are no identifiable [facility] Care Management needs at this time. If patient's post hospital needs change, please place a [facility] Care Management consult. For questions please contact:  Marthenia Rolling, [place], RN,BSN [facility] Liaison [phone]

## 2016-05-02 NOTE — Clinical Social Work Placement (Signed)
Patient has a bed at [facility]. CSW has completed FL2 & will continue to follow and assist with discharge when ready.    Raynaldo Opitz, [facility] Clinical Social Worker cell #: [phone]     CLINICAL SOCIAL WORK PLACEMENT  NOTE  Date:  05/02/2016  Patient Details  Name: [name] MRN: [id number] Date of Birth: 04-19-1934  Clinical Social Work is seeking post-discharge placement for this patient at the [place] level of care (*CSW will initial, date and re-position this form in  chart as items are completed):  Yes   Patient/family provided with [facility] Work Department's list of facilities offering this level of care within the geographic area requested by the patient (or if unable, by the patient's family).  Yes   Patient/family informed of their freedom to choose among providers that offer the needed level of care, that participate in Medicare, Medicaid or managed care program needed by the patient, have an available bed and are willing to accept the patient.  Yes   Patient/family informed of [facility]'s ownership interest in [facility] and [facility], as well as of the fact that they are under no obligation to receive care at these facilities.  PASRR submitted to EDS on 05/02/16     PASRR number received on 05/02/16     Existing PASRR number confirmed on       FL2 transmitted to all facilities in geographic area requested by pt/family on 05/02/16     FL2 transmitted to all facilities within larger geographic area on       Patient informed that his/her managed care company has contracts with or will negotiate with certain facilities, including the following:        Yes   Patient/family informed of bed offers received.  Patient chooses bed at [facility], [place]     Physician recommends and patient chooses bed at      Patient to be transferred to [place], [place]  on  .  Patient to be transferred to facility by       Patient family notified on   of transfer.  Name of family member notified:        PHYSICIAN       Additional Comment:    _______________________________________________ Standley Brooking, LCSW 05/02/2016, 10:26 AM

## 2016-05-02 NOTE — Clinical Social Work Placement (Signed)
Patient is set to discharge to [facility] today. Patient & husband, Barnabas Lister made aware. Discharge packet given to RN, Tess. [facility] called for transport.     Raynaldo Opitz, [facility] Clinical Social Worker cell #: [phone]    CLINICAL SOCIAL WORK PLACEMENT  NOTE  Date:  05/02/2016  Patient Details  Name: [name] MRN: [id number] Date of Birth: 1934-06-05  Clinical Social Work is seeking post-discharge placement for this patient at the [place] level of care (*CSW will initial, date and re-position this form in  chart as items are completed):  Yes   Patient/family provided with [facility] Work Department's list of facilities offering this level of care within the geographic area requested by the patient (or if unable, by the patient's family).  Yes   Patient/family informed of their freedom to choose among providers that offer the needed level of care, that participate in Medicare, Medicaid or managed care program needed by the patient, have an available bed and are willing to accept the patient.  Yes   Patient/family informed of [facility]'s ownership interest in [facility] and [facility], as well as of the fact that they are under no obligation to receive care at these facilities.  PASRR submitted to EDS on 05/02/16     PASRR number received on 05/02/16     Existing PASRR number confirmed on       FL2 transmitted to all facilities in geographic area requested by pt/family on 05/02/16     FL2 transmitted to all facilities within larger geographic area on       Patient informed that his/her managed care company has contracts with or will negotiate with certain facilities, including the following:        Yes   Patient/family informed of bed offers received.  Patient chooses bed at [place], [place]     Physician recommends and patient chooses bed at      Patient to be  transferred to [facility] on 05/02/16.  Patient to be transferred to facility by [facility]     Patient family notified on 05/02/16 of transfer.  Name of family member notified:  patient's husband, Barnabas Lister via phone     PHYSICIAN       Additional Comment:    _______________________________________________ Standley Brooking, LCSW 05/02/2016, 2:32 PM

## 2016-05-02 NOTE — Progress Notes (Signed)
OT Cancellation Note  Patient Details Name: [name] MRN: [id number] DOB: 17-Jan-1935   Cancelled Treatment:    Reason Eval/Treat Not Completed: Other (comment)  Noted plan for SNF- will defer OT eval to SNF  [facility], No Name  Betsy Pries 05/02/2016, 12:36 PM

## 2016-05-03 ENCOUNTER — Telehealth: Payer: Self-pay

## 2016-05-03 NOTE — Telephone Encounter (Addendum)
I have made the 1st attempt to contact the patient or family member in charge, in order to follow up from recently being discharged from the hospital. I left a message on voicemail but I will make another attempt at a different time.   2nd attempt on 05/05/16, spoke to Mr. Gogan. States pt is at [employer] and is doing better overall. Pt was discharged from [facility] on 05/02/16.

## 2016-05-05 ENCOUNTER — Telehealth (HOSPITAL_COMMUNITY): Payer: Self-pay | Admitting: *Deleted

## 2016-05-07 NOTE — Telephone Encounter (Signed)
Hopefully, the social worker there will get her insurance situation taken care of so she can stay for the long term care she needs.  She is unable to perform her daily activities independently at home and her husband is not very involved.

## 2016-05-08 ENCOUNTER — Encounter (HOSPITAL_BASED_OUTPATIENT_CLINIC_OR_DEPARTMENT_OTHER): Payer: Medicare Other | Attending: Internal Medicine

## 2016-05-08 NOTE — Telephone Encounter (Signed)
error 

## 2016-05-09 ENCOUNTER — Ambulatory Visit (HOSPITAL_COMMUNITY): Payer: Medicare Other | Admitting: Nurse Practitioner

## 2016-05-10 ENCOUNTER — Other Ambulatory Visit (HOSPITAL_COMMUNITY): Payer: Self-pay | Admitting: Internal Medicine

## 2016-05-10 DIAGNOSIS — T148XXA Other injury of unspecified body region, initial encounter: Secondary | ICD-10-CM

## 2016-05-10 DIAGNOSIS — L02416 Cutaneous abscess of left lower limb: Secondary | ICD-10-CM

## 2016-05-16 ENCOUNTER — Ambulatory Visit (HOSPITAL_COMMUNITY)
Admission: RE | Admit: 2016-05-16 | Discharge: 2016-05-16 | Disposition: A | Discharge status: Home / Self Care | Payer: No Typology Code available for payment source | Source: Ambulatory Visit | Attending: Internal Medicine | Admitting: Internal Medicine

## 2016-05-16 DIAGNOSIS — I83029 Varicose veins of left lower extremity with ulcer of unspecified site: Secondary | ICD-10-CM | POA: Insufficient documentation

## 2016-05-16 DIAGNOSIS — Z96652 Presence of left artificial knee joint: Secondary | ICD-10-CM | POA: Insufficient documentation

## 2016-05-16 DIAGNOSIS — I872 Venous insufficiency (chronic) (peripheral): Secondary | ICD-10-CM | POA: Insufficient documentation

## 2016-05-16 DIAGNOSIS — L02416 Cutaneous abscess of left lower limb: Secondary | ICD-10-CM | POA: Insufficient documentation

## 2016-05-16 DIAGNOSIS — T148XXA Other injury of unspecified body region, initial encounter: Secondary | ICD-10-CM

## 2016-05-16 DIAGNOSIS — L03116 Cellulitis of left lower limb: Secondary | ICD-10-CM | POA: Diagnosis present

## 2016-05-16 MED ORDER — GADOBENATE DIMEGLUMINE 529 MG/ML IV SOLN
15.0000 mL | Freq: Once | INTRAVENOUS | Status: AC | PRN
  Administered 2016-05-16: 13 mL via INTRAVENOUS

## 2016-05-17 ENCOUNTER — Encounter (HOSPITAL_COMMUNITY): Payer: Self-pay | Admitting: Nurse Practitioner

## 2016-05-17 ENCOUNTER — Ambulatory Visit (HOSPITAL_COMMUNITY)
Admission: RE | Admit: 2016-05-17 | Discharge: 2016-05-17 | Disposition: A | Discharge status: Home / Self Care | Payer: No Typology Code available for payment source | Source: Ambulatory Visit | Attending: Nurse Practitioner | Admitting: Nurse Practitioner

## 2016-05-17 VITALS — BP 118/62 | HR 78 | Ht 67.0 in

## 2016-05-17 DIAGNOSIS — E785 Hyperlipidemia, unspecified: Secondary | ICD-10-CM | POA: Insufficient documentation

## 2016-05-17 DIAGNOSIS — Z9889 Other specified postprocedural states: Secondary | ICD-10-CM | POA: Insufficient documentation

## 2016-05-17 DIAGNOSIS — J42 Unspecified chronic bronchitis: Secondary | ICD-10-CM | POA: Diagnosis not present

## 2016-05-17 DIAGNOSIS — M069 Rheumatoid arthritis, unspecified: Secondary | ICD-10-CM | POA: Insufficient documentation

## 2016-05-17 DIAGNOSIS — Z809 Family history of malignant neoplasm, unspecified: Secondary | ICD-10-CM | POA: Diagnosis not present

## 2016-05-17 DIAGNOSIS — I739 Peripheral vascular disease, unspecified: Secondary | ICD-10-CM | POA: Insufficient documentation

## 2016-05-17 DIAGNOSIS — F17210 Nicotine dependence, cigarettes, uncomplicated: Secondary | ICD-10-CM | POA: Diagnosis not present

## 2016-05-17 DIAGNOSIS — Z79899 Other long term (current) drug therapy: Secondary | ICD-10-CM | POA: Diagnosis not present

## 2016-05-17 DIAGNOSIS — H409 Unspecified glaucoma: Secondary | ICD-10-CM | POA: Diagnosis not present

## 2016-05-17 DIAGNOSIS — F329 Major depressive disorder, single episode, unspecified: Secondary | ICD-10-CM | POA: Insufficient documentation

## 2016-05-17 DIAGNOSIS — Z7901 Long term (current) use of anticoagulants: Secondary | ICD-10-CM | POA: Diagnosis not present

## 2016-05-17 DIAGNOSIS — I480 Paroxysmal atrial fibrillation: Secondary | ICD-10-CM | POA: Diagnosis not present

## 2016-05-17 DIAGNOSIS — F419 Anxiety disorder, unspecified: Secondary | ICD-10-CM | POA: Diagnosis not present

## 2016-05-17 DIAGNOSIS — I10 Essential (primary) hypertension: Secondary | ICD-10-CM | POA: Insufficient documentation

## 2016-05-17 DIAGNOSIS — G8929 Other chronic pain: Secondary | ICD-10-CM | POA: Insufficient documentation

## 2016-05-17 DIAGNOSIS — M7989 Other specified soft tissue disorders: Secondary | ICD-10-CM | POA: Diagnosis not present

## 2016-05-17 DIAGNOSIS — I872 Venous insufficiency (chronic) (peripheral): Secondary | ICD-10-CM | POA: Diagnosis not present

## 2016-05-17 DIAGNOSIS — E039 Hypothyroidism, unspecified: Secondary | ICD-10-CM | POA: Insufficient documentation

## 2016-05-17 DIAGNOSIS — Z96652 Presence of left artificial knee joint: Secondary | ICD-10-CM | POA: Diagnosis not present

## 2016-05-17 DIAGNOSIS — Z8249 Family history of ischemic heart disease and other diseases of the circulatory system: Secondary | ICD-10-CM | POA: Insufficient documentation

## 2016-05-17 NOTE — Progress Notes (Signed)
Primary Care Physician: Hollace Kinnier, DO Referring Physician: [facility] f/u   [name] is a 82 y.o. female with a h/o  rheumatoid arthritis, ulcerative colitis previously treated with Remicade, atrial fibrillation, diastolic CHF, Hypertension, and venous stasis dermatitis presented to [facility] with a 2 day history of generalized weakness. The patient was recently discharged from the hospital after a stay from 03/30/2016 through 04/01/2016 after sustaining a mechanical fall resulting in a laceration of her scalp. The patient recently saw her primary care provider, Dr. Hollace Kinnier on 04/10/2016. At that time, there was concern for increasing drainage from her left lower extremity ulceration. The patient was placed on cefuroxime.  She was admitted 2/15 thru 2/10 for tx of chronic venous statis dermatitis of both LE's, venous ulcer of left leg and chronic CHF. She was also noted to have  rapid afib. She was discharged in SR to SNF and is in the afib clinic for f/u.She remains in Sr. BP is well managed. She will answer questions appropriately, if asked, otherwise is quiet. She  has no complaints today.  Today, she denies symptoms of palpitations, chest pain, shortness of breath, orthopnea, PND, lower extremity edema, dizziness, presyncope, syncope, or neurologic sequela. The patient is tolerating medications without difficulties and is otherwise without complaint today.   Past Medical History:  Diagnosis Date  . Anemia   . Anxiety    sometimes   . Bruise    right lower extremity with opsite on  . Calcified cerebral meningioma ([place])   . Chronic back pain    "my entire spine"  . Chronic bronchitis ([place]) 10/31/2011   "prone to it; I have sinus/bronchitis problem probably q yr"  . Complication of anesthesia 2009   htn with block- for hand surgery , pt thinks anesthesia causing some long term confusion  . Depression   . Diarrhea 01/27/2015  . Dizziness   . Dysrhythmia    pt states bring off  coumadin for paroxysmal atrial fibrillation for last month  . Edema   . Encounter for long-term (current) use of other medications   . Falls   . H/O clavicle fracture    left  . Hard of hearing   . History of blood transfusion   . Hypertension    pcp   dr reed   [facility]  . Hypothyroidism   . Hypothyroidism   . Multiple skin tears 10/31/2011   "get them very easily; my skin is thin and I bruise easily"  . Other and unspecified hyperlipidemia   . Peripheral vascular disease ([place])   . Pneumonia 2008  . Rheumatoid arthritis(714.0)    degenerative disc disease , ra  . Senile osteoporosis   . Spinal stenosis, unspecified region other than cervical   . Superficial thrombophlebitis 04/20/2015  . Swelling of right upper extremity 11/26/2014  . Ulcerative colitis ([place])   . Unspecified glaucoma(365.9)    Past Surgical History:  Procedure Laterality Date  . ANTERIOR CERVICAL DECOMP/DISCECTOMY FUSION N/A 02/17/2014   Procedure: CERVICAL FOUR TO FIVE ANTERIOR CERVICAL DECOMPRESSION/DISCECTOMY FUSION 1 LEVEL;  Surgeon: [name], MD;  Location: [facility];  Service: Neurosurgery;  Laterality: N/A;  C4-5 Anterior cervical decompression/diskectomy/fusion  . BACK SURGERY    . CATARACT EXTRACTION Left 11/11/2013  . CATARACT EXTRACTION Right 12/30/2013  . EXCISIONAL TOTAL KNEE ARTHROPLASTY WITH ANTIBIOTIC SPACERS Left 03/01/2015   Procedure: RESECTION LEFT TOTAL KNEE ARTHROPLASTY WITH PLACEMENT OF  ANTIBIOTIC SPACERS;  Surgeon: Paralee Cancel, MD;  Location: [facility];  Service: Orthopedics;  Laterality: Left;  . EYE SURGERY Bilateral   . FUNCTIONAL ENDOSCOPIC SINUS SURGERY  1988  . I&D KNEE WITH POLY EXCHANGE Left 10/13/2014   Procedure: INCISION AND DRAINAGE LEFT TOTAL KNEE WITH POLY EXCHANGE;  Surgeon: Paralee Cancel, MD;  Location: [facility];  Service: Orthopedics;  Laterality: Left;  . JOINT REPLACEMENT  2009; 2006   joints 2 fingers,rt hand; joint left thumb  . POSTERIOR FUSION LUMBAR SPINE   10/31/2011   L2-3; 3-4  . POSTERIOR FUSION LUMBAR SPINE  11/2009   L3-4;  L4-5  . POSTERIOR LUMBAR FUSION N/A 08/05/13   T1, T2  . REIMPLANTATION OF TOTAL KNEE Left 07/05/2015   Procedure: REIMPLANTATION OF LEFT TOTAL KNEE WITH REMOVAL OF ANTIBIOTIC SPACER;  Surgeon: Paralee Cancel, MD;  Location: [facility];  Service: Orthopedics;  Laterality: Left;  . SINUS SURGERY WITH INSTATRAK    . SPINE SURGERY    . THYROIDECTOMY, PARTIAL  1997  . TONSILLECTOMY     "when I was a small child"  . TOTAL KNEE ARTHROPLASTY Left 06/16/2014   Procedure: TOTAL KNEE ARTHROPLASTY;  Surgeon: Paralee Cancel, MD;  Location: [facility];  Service: Orthopedics;  Laterality: Left;    Current Outpatient Prescriptions  Medication Sig Dispense Refill  . apixaban (ELIQUIS) 5 MG TABS tablet Take 1 tablet (5 mg total) by mouth 2 (two) times daily. 60 tablet 5  . balsalazide (COLAZAL) 750 MG capsule Take 2,250 mg by mouth 2 (two) times daily.    Marland Kitchen bismuth subsalicylate (PEPTO BISMOL) 262 MG/15ML suspension Take 15 mLs by mouth 4 (four) times daily -  before meals and at bedtime.    Marland Kitchen buPROPion (WELLBUTRIN SR) 100 MG 12 hr tablet Take 100 mg by mouth daily.    . Calcium Carb-Cholecalciferol (CALCIUM + D3) 600-200 MG-UNIT TABS Take 1 tablet by mouth daily with breakfast.     . cefUROXime (CEFTIN) 500 MG tablet Take 1 tablet (500 mg total) by mouth daily. 30 tablet 11  . celecoxib (CELEBREX) 200 MG capsule Take 1 capsule (200 mg total) by mouth 2 (two) times daily. 60 capsule 5  . collagenase (SANTYL) ointment Apply 1 application topically once a week.    . diazepam (VALIUM) 5 MG tablet Take 1 tablet (5 mg total) by mouth every 12 (twelve) hours. 10 tablet 0  . diltiazem (CARDIZEM CD) 180 MG 24 hr capsule Take 1 capsule (180 mg total) by mouth daily. 30 capsule 0  . docusate sodium (COLACE) 100 MG capsule Take 100 mg by mouth 2 (two) times daily as needed for mild constipation.    Marland Kitchen estradiol (ESTRACE) 0.5 MG tablet Take 0.5 mg by mouth  daily.  3  . feeding supplement, ENSURE ENLIVE, (ENSURE ENLIVE) LIQD Take 237 mLs by mouth 2 (two) times daily between meals. 237 mL 12  . ferrous sulfate 325 (65 FE) MG tablet Take 325 mg by mouth daily with breakfast.     . fluconazole (DIFLUCAN) 150 MG tablet Take 150 mg by mouth daily.    . folic acid (FOLVITE) 1 MG tablet Take 2 mg by mouth daily.     . furosemide (LASIX) 40 MG tablet Take 40 mg by mouth daily.    Marland Kitchen guaiFENesin (MUCINEX) 600 MG 12 hr tablet Take 600 mg by mouth 2 (two) times daily.    Marland Kitchen latanoprost (XALATAN) 0.005 % ophthalmic solution Place 1 drop into both eyes at bedtime.    Marland Kitchen levothyroxine (SYNTHROID, LEVOTHROID) 75 MCG tablet  Take 1 tablet (75 mcg total) by mouth daily. 90 tablet 3  . medroxyPROGESTERone (PROVERA) 2.5 MG tablet Take 1 tablet (2.5 mg total) by mouth at bedtime. 30 tablet 5  . predniSONE (DELTASONE) 5 MG tablet Take 5 mg by mouth daily with breakfast.    . traMADol (ULTRAM) 50 MG tablet Take 1 tablet (50 mg total) by mouth every 6 (six) hours as needed for moderate pain (or Headache unrelieved by tylenol). 10 tablet 0   No current facility-administered medications for this encounter.     No Known Allergies  Social History   Social History  . Marital status: Married    Spouse name: N/A  . Number of children: N/A  . Years of education: N/A   Occupational History  . Not on file.   Social History Main Topics  . Smoking status: Current Every Day Smoker    Packs/day: 0.05    Years: 60.00    Types: Cigarettes    Last attempt to quit: 02/20/2015  . Smokeless tobacco: Never Used     Comment: 03/30/2016 "1 puff/month; Ii dont inhale"  . Alcohol use 0.0 oz/week     Comment: 03/30/2016 "1 beer or glass of wine once/month"  . Drug use: No  . Sexual activity: Not Currently   Other Topics Concern  . Not on file   Social History Narrative  . No narrative on file    Family History  Problem Relation Age of Onset  . Heart disease Mother   .  Cancer Father     lung    ROS- All systems are reviewed and negative except as per the HPI above  Physical Exam: Vitals:   05/17/16 1406  BP: 118/62  Pulse: 78  Height: 5\' 7"  (1.702 m)   Wt Readings from Last 3 Encounters:  05/02/16 140 lb 6.9 oz (63.7 kg)  04/10/16 131 lb (59.4 kg)  03/30/16 131 lb 6.3 oz (59.6 kg)    Labs: Lab Results  Component Value Date   NA 138 05/02/2016   K 4.6 05/02/2016   CL 112 (H) 05/02/2016   CO2 21 (L) 05/02/2016   GLUCOSE 77 05/02/2016   BUN 25 (H) 05/02/2016   CREATININE 0.87 05/02/2016   CALCIUM 8.4 (L) 05/02/2016   PHOS 2.9 05/02/2016   MG 2.0 05/02/2016   Lab Results  Component Value Date   INR 1.10 06/28/2015   Lab Results  Component Value Date   CHOL 239 (H) 03/23/2014   HDL 86 03/23/2014   LDLCALC 130 (H) 03/23/2014   TRIG 115 03/23/2014     GEN- The patient is well appearing, alert and oriented x 3 today.   Head- normocephalic, atraumatic Eyes-  Sclera clear, conjunctiva pink Ears- hearing intact Oropharynx- clear Neck- supple, no JVP Lymph- no cervical lymphadenopathy Lungs- Clear to ausculation bilaterally, normal work of breathing Heart- Regular rate and rhythm, no murmurs, rubs or gallops, PMI not laterally displaced GI- soft, NT, ND, + BS Extremities- no clubbing, cyanosis, or edema MS- no significant deformity or atrophy Skin- no rash or lesion Psych- euthymic mood, full affect Neuro- strength and sensation are intact  EKG-NSR at 78 bpm, pr int 184 ms, qrs int 76 ms, qtc 433 ms Epic records reviewed    Assessment and Plan: 1. Paroxysmal afib Started on cardizem 180 mg and is in SR Continue Eliquis for a chadsvasc score of at least 4  2. LE venous insufficiency  Both lower legs are wrapped Dressing changed per SNF  3. Chronic diastolic dysfunction Stable Daily weights   4. HTN  Stable at 118/62  F/u per PCP 3/15 afib clinc as needed  Butch Penny C. Carroll, [facility] [address] [place], [place] [address] [phone]

## 2016-05-25 ENCOUNTER — Ambulatory Visit: Payer: Medicare Other | Admitting: Internal Medicine

## 2016-05-30 ENCOUNTER — Telehealth: Payer: Self-pay | Admitting: *Deleted

## 2016-05-30 ENCOUNTER — Encounter: Payer: Self-pay | Admitting: Internal Medicine

## 2016-05-30 ENCOUNTER — Ambulatory Visit (INDEPENDENT_AMBULATORY_CARE_PROVIDER_SITE_OTHER): Payer: Medicare Other | Admitting: Internal Medicine

## 2016-05-30 DIAGNOSIS — T8454XS Infection and inflammatory reaction due to internal left knee prosthesis, sequela: Secondary | ICD-10-CM

## 2016-05-30 NOTE — Telephone Encounter (Signed)
Patient husband called and stated that patient is in [facility] and he is wanting to know if patient should continue taking Diltiazem because he went to get it refilled at the pharmacy for the patient and they denied it and told him to call her Primary Dr. He stated that Dr. Raiford Noble prescribed while in the hospital. He also stated that he canceled her appointment with [place] because he couldn't keep up with them all. Please Advise.

## 2016-05-30 NOTE — Telephone Encounter (Signed)
Patient husband notified and agreed.

## 2016-05-30 NOTE — Telephone Encounter (Signed)
If she is still at [facility] and his wife should return the paperwork from the cardiology appt to [facility] including the prescription for the diltiazem and it will be ordered through the facility pharmacy with the other medications. Mr. Kastens should not have to be involved in going to pharmacies when she's staying at a SNF.

## 2016-05-31 NOTE — Progress Notes (Signed)
   Subjective:    Patient ID: [name], female    DOB: 09-15-1934, 82 y.o.   MRN: [id number]  HPI She comes in for routine follow up of PJI. Previous history: [name] is a 82 y.o. female with RA, PVD, hypothyroidsim, afib on coumadin who presented for reimplantation of her TKA on the left. She initially underwent left TKA in April 9169 and some complications with cellulitis treated with clindamycin and then developed left knee pain, concerned for infection and underwent I and D with polyexchange in August 2016. She completed 6 weeks of IV therapy with vancomycin and ceftriaxone and continue with doxycycline and cephalexin. She did have some diarrhea but C diff was negative and it resolved once cephalexin was stopped. In December, 2016, aspiration was done and underwent resection arthroplasty with antibiotic spacer placement. At that time, her tibial component could not safely be removed due to cracking so was left in place. I then placed her on vancomycin and ceftriaxone for 28 days and then transitioned to oral therapy. She then presented for reimplantation but she had elevated WBC in her synovial fluid with an elevated CRP and we opted to reinitiate therapy with 6 weeks of IV antibiotics which she completed on June 5th.  She then transitioned to oral doxycycline and cefuroxime for 3-6 months and taking that.  After her last visit, I transitioned her to oral cefuroxime for indefinite suppression.  She continues to take that daily without any complaints. No fever, no chills.  No associated n/v/d.   I also found a recent MRI report of her knee and that there is a skin ulceration that abuts the stem of the knee replacement.  There is also a suggestion of possible osteomyelitis.  She has not yet seen orthopedics and is not aware of having any scheduled follow up with them. No fever, no chills.   She is not ambulatory  MRI personally reviewed and I suspect it communicates with the knee  joint       Review of Systems  Constitutional: Negative for unexpected weight change.  Gastrointestinal: Negative for abdominal pain and diarrhea.  Skin: Negative for rash.  Neurological: Negative for dizziness and light-headedness.       Objective:   Physical Exam  Constitutional: No distress.  Eyes: No scleral icterus.  Cardiovascular: Normal rate, regular rhythm and normal heart sounds.   Musculoskeletal: She exhibits deformity.  Multiple RA deformities in finger joints  Skin: No rash noted.   SH: she continues to live in a NH       Assessment & Plan:

## 2016-05-31 NOTE — Assessment & Plan Note (Addendum)
With the new MRI findings, she may have osteomyelitis but not definitive but she does have a chronic ulcer that tracts to or certainly close to, the knee that has progressed since last visit 6 months ago.  She may need to have the prosthesis removed.  I have recommended she see an orthopedist

## 2016-06-01 ENCOUNTER — Telehealth: Payer: Self-pay | Admitting: *Deleted

## 2016-06-01 NOTE — Telephone Encounter (Signed)
Claiborne Billings from [facility] called to request last two office notes to forward to [facility] orthopedic for referral. Notes faxed to [phone], confirmation received. Myrtis Hopping

## 2016-06-30 ENCOUNTER — Telehealth: Payer: Self-pay

## 2016-06-30 NOTE — Telephone Encounter (Signed)
Patient is currently in [facility] (bed-written). Patient will be discharged 07/09/16 (approximately). [facility] will provide home bound services when patient returns home. In order for patient to get these services Dr.[name] will need to certify that patient is home bound   Patient's husband was told this process needs to take place before patient is discharged. Patient spoke with a customer service rep with [facility] @ [phone]  I called [facility], I was transferred to the provider line [phone]. Spoke with Cheryll Dessert was not sure of what exactly I needed to do. Opal Sidles stated that a provider needs to place a [facility] and it need to states patient is homebound, Opal Sidles is aware I will further follow-up with Mr.Schuh.  I called Mr.Shatto and explained to him that the Education officer, museum at [employer] will have the doctor that is overseeing patient sign orders for [facility] and that by default will have the provider note if the patient is homebound or not. In most cases the Social Worker will complete this 1-2 days before patient is discharged. Patient will follow-up with Dr.[name] after discharge and additional home health order would come from Dr.[name]. Mr.Klinck verbalized understanding of this process and will speak with the Social Worker at [employer] this evening when he visits wife.

## 2016-08-03 ENCOUNTER — Telehealth: Payer: Self-pay | Admitting: *Deleted

## 2016-08-03 NOTE — Telephone Encounter (Signed)
Voice mail  from Santiago Glad with [facility] stating that per Dr. Luana Shu, patient needed a follow up with Dr. Linus Salmons. There was a shift change and I called back and spoke to [facility] and advised that at last visit in March 2018, Dr. Linus Salmons wanted pt to follow up with orthopedic. She said that Ms. Melick did see ortho and amputation was suggested. Asked if they had obtained any cultures on her open wound and they have not. [facility] with try and obtain a wound culture and I advised I would speak to Dr. Linus Salmons in the AM and find out when he wants the patient to follow up at [facility]. Phone # [phone], Room #601. Myrtis Hopping

## 2016-08-04 NOTE — Telephone Encounter (Signed)
Patient has appt with ortho today and per RN at skilled nursing, they feel like patient does need the amputation and if she does not agree, she will need to go to hospice. SNF will call back on Monday and let this clinic know what the patient's decision is. If she still is not agreeing to the amputation, they will call back to schedule her an appt with Dr. Linus Salmons. [name]

## 2016-08-04 NOTE — Telephone Encounter (Signed)
Yes, ok to follow up routinely, looks like she saw ortho already

## 2016-08-21 NOTE — Patient Instructions (Addendum)
[name]  08/21/2016   Your procedure is scheduled on: 08/29/2016    Report to [facility] Main  Entrance   Report to admitting at   1220pm   Call this number if you have problems the morning of surgery  [phone]   Remember: ONLY 1 PERSON MAY GO WITH YOU TO SHORT STAY TO GET  READY MORNING OF YOUR SURGERY.  Do not eat food after midnite.  May have clear liquids from 12 midnite until 0800am morning of surgery then nothing by mouth.      Take these medicines the morning of surgery with A SIP OF WATER: Wellbutrin, Diazepam ( Valium), Diltiazem ( dilacor). Synthroid, Ellipta Inhaler and bring , Oxycodone If needed, Colazal                                 You may not have any metal on your body including hair pins and              piercings  Do not wear jewelry, make-up, lotions, powders or perfumes, deodorant             Do not wear nail polish.  Do not shave  48 hours prior to surgery.                 Do not bring valuables to the hospital. [place].  Contacts, dentures or bridgework may not be worn into surgery.  Leave suitcase in the car. After surgery it may be brought to your room.                   Please read over the following fact sheets you were given: _____________________________________________________________________             [facility] - Preparing for Surgery Before surgery, you can play an important role.  Because skin is not sterile, your skin needs to be as free of germs as possible.  You can reduce the number of germs on your skin by washing with CHG (chlorahexidine gluconate) soap before surgery.  CHG is an antiseptic cleaner which kills germs and bonds with the skin to continue killing germs even after washing. Please DO NOT use if you have an allergy to CHG or antibacterial soaps.  If your skin becomes reddened/irritated stop using the CHG and inform your nurse when you  arrive at Short Stay. Do not shave (including legs and underarms) for at least 48 hours prior to the first CHG shower.  You may shave your face/neck. Please follow these instructions carefully:  1.  Shower with CHG Soap the night before surgery and the  morning of Surgery.  2.  If you choose to wash your hair, wash your hair first as usual with your  normal  shampoo.  3.  After you shampoo, rinse your hair and body thoroughly to remove the  shampoo.                           4.  Use CHG as you would any other liquid soap.  You can apply chg directly  to the skin and wash  Gently with a scrungie or clean washcloth.  5.  Apply the CHG Soap to your body ONLY FROM THE NECK DOWN.   Do not use on face/ open                           Wound or open sores. Avoid contact with eyes, ears mouth and genitals (private parts).                       Wash face,  Genitals (private parts) with your normal soap.             6.  Wash thoroughly, paying special attention to the area where your surgery  will be performed.  7.  Thoroughly rinse your body with warm water from the neck down.  8.  DO NOT shower/wash with your normal soap after using and rinsing off  the CHG Soap.                9.  Pat yourself dry with a clean towel.            10.  Wear clean pajamas.            11.  Place clean sheets on your bed the night of your first shower and do not  sleep with pets. Day of Surgery : Do not apply any lotions/deodorants the morning of surgery.  Please wear clean clothes to the hospital/surgery center.  FAILURE TO FOLLOW THESE INSTRUCTIONS MAY RESULT IN THE CANCELLATION OF YOUR SURGERY PATIENT SIGNATURE_________________________________  NURSE SIGNATURE__________________________________  ________________________________________________________________________    CLEAR LIQUID DIET   Foods Allowed                                                                     Foods Excluded  Coffee  and tea, regular and decaf                             liquids that you cannot  Plain Jell-O in any flavor                                             see through such as: Fruit ices (not with fruit pulp)                                     milk, soups, orange juice  Iced Popsicles                                    All solid food Carbonated beverages, regular and diet                                    Cranberry, grape and apple juices Sports drinks like Gatorade Lightly seasoned clear broth or consume(fat free) Sugar, honey syrup  Sample Menu Breakfast                                Lunch                                     Supper Cranberry juice                    Beef broth                            Chicken broth Jell-O                                     Grape juice                           Apple juice Coffee or tea                        Jell-O                                      Popsicle                                                Coffee or tea                        Coffee or tea  _____________________________________________________________________   WHAT IS A BLOOD TRANSFUSION? Blood Transfusion Information  A transfusion is the replacement of blood or some of its parts. Blood is made up of multiple cells which provide different functions.  Red blood cells carry oxygen and are used for blood loss replacement.  White blood cells fight against infection.  Platelets control bleeding.  Plasma helps clot blood.  Other blood products are available for specialized needs, such as hemophilia or other clotting disorders. BEFORE THE TRANSFUSION  Who gives blood for transfusions?   Healthy volunteers who are fully evaluated to make sure their blood is safe. This is blood bank blood. Transfusion therapy is the safest it has ever been in the practice of medicine. Before blood is taken from a donor, a complete history is taken to make sure that person has no history of diseases  nor engages in risky social behavior (examples are intravenous drug use or sexual activity with multiple partners). The donor's travel history is screened to minimize risk of transmitting infections, such as malaria. The donated blood is tested for signs of infectious diseases, such as HIV and hepatitis. The blood is then tested to be sure it is compatible with you in order to minimize the chance of a transfusion reaction. If you or a relative donates blood, this is often done in anticipation of surgery and is not appropriate for emergency situations. It takes many days to process the donated blood. RISKS AND COMPLICATIONS Although transfusion therapy is very safe and saves many lives, the main dangers of transfusion include:  Getting an infectious disease.  Developing a transfusion reaction. This is an allergic reaction to something in the blood you were given. Every precaution is taken to prevent this. The decision to have a blood transfusion has been considered carefully by your caregiver before blood is given. Blood is not given unless the benefits outweigh the risks. AFTER THE TRANSFUSION  Right after receiving a blood transfusion, you will usually feel much better and more energetic. This is especially true if your red blood cells have gotten low (anemic). The transfusion raises the level of the red blood cells which carry oxygen, and this usually causes an energy increase.  The nurse administering the transfusion will monitor you carefully for complications. HOME CARE INSTRUCTIONS  No special instructions are needed after a transfusion. You may find your energy is better. Speak with your caregiver about any limitations on activity for underlying diseases you may have. SEEK MEDICAL CARE IF:   Your condition is not improving after your transfusion.  You develop redness or irritation at the intravenous (IV) site. SEEK IMMEDIATE MEDICAL CARE IF:  Any of the following symptoms occur over the  next 12 hours:  Shaking chills.  You have a temperature by mouth above 102 F (38.9 C), not controlled by medicine.  Chest, back, or muscle pain.  People around you feel you are not acting correctly or are confused.  Shortness of breath or difficulty breathing.  Dizziness and fainting.  You get a rash or develop hives.  You have a decrease in urine output.  Your urine turns a dark color or changes to pink, red, or brown. Any of the following symptoms occur over the next 10 days:  You have a temperature by mouth above 102 F (38.9 C), not controlled by medicine.  Shortness of breath.  Weakness after normal activity.  The white part of the eye turns yellow (jaundice).  You have a decrease in the amount of urine or are urinating less often.  Your urine turns a dark color or changes to pink, red, or brown. Document Released: 02/25/2000 Document Revised: 05/22/2011 Document Reviewed: 10/14/2007 ExitCare Patient Information 2014 [place].  _______________________________________________________________________  Incentive Spirometer  An incentive spirometer is a tool that can help keep your lungs clear and active. This tool measures how well you are filling your lungs with each breath. Taking long deep breaths may help reverse or decrease the chance of developing breathing (pulmonary) problems (especially infection) following:  A long period of time when you are unable to move or be active. BEFORE THE PROCEDURE   If the spirometer includes an indicator to show your best effort, your nurse or respiratory therapist will set it to a desired goal.  If possible, sit up straight or lean slightly forward. Try not to slouch.  Hold the incentive spirometer in an upright position. INSTRUCTIONS FOR USE  1. Sit on the edge of your bed if possible, or sit up as far as you can in bed or on a chair. 2. Hold the incentive spirometer in an upright position. 3. Breathe out  normally. 4. Place the mouthpiece in your mouth and seal your lips tightly around it. 5. Breathe in slowly and as deeply as possible, raising the piston or the ball toward the top of the column. 6. Hold your breath for 3-5 seconds or for as long as possible. Allow the piston or ball to fall to the bottom of the column. 7. Remove the mouthpiece from your mouth and breathe out normally.  8. Rest for a few seconds and repeat Steps 1 through 7 at least 10 times every 1-2 hours when you are awake. Take your time and take a few normal breaths between deep breaths. 9. The spirometer may include an indicator to show your best effort. Use the indicator as a goal to work toward during each repetition. 10. After each set of 10 deep breaths, practice coughing to be sure your lungs are clear. If you have an incision (the cut made at the time of surgery), support your incision when coughing by placing a pillow or rolled up towels firmly against it. Once you are able to get out of bed, walk around indoors and cough well. You may stop using the incentive spirometer when instructed by your caregiver.  RISKS AND COMPLICATIONS  Take your time so you do not get dizzy or light-headed.  If you are in pain, you may need to take or ask for pain medication before doing incentive spirometry. It is harder to take a deep breath if you are having pain. AFTER USE  Rest and breathe slowly and easily.  It can be helpful to keep track of a log of your progress. Your caregiver can provide you with a simple table to help with this. If you are using the spirometer at home, follow these instructions: [place] IF:   You are having difficultly using the spirometer.  You have trouble using the spirometer as often as instructed.  Your pain medication is not giving enough relief while using the spirometer.  You develop fever of 100.5 F (38.1 C) or higher. SEEK IMMEDIATE MEDICAL CARE IF:   You cough up bloody sputum  that had not been present before.  You develop fever of 102 F (38.9 C) or greater.  You develop worsening pain at or near the incision site. MAKE SURE YOU:   Understand these instructions.  Will watch your condition.  Will get help right away if you are not doing well or get worse. Document Released: 07/10/2006 Document Revised: 05/22/2011 Document Reviewed: 09/10/2006 [facility] Patient Information 2014 [place], [place].   ________________________________________________________________________

## 2016-08-22 NOTE — H&P (Signed)
[name] is an 82 y.o. female.    Chief Complaint:    Recurrent infection left total knee arthroplasty  Procedure:   Resection left TKA and placement of antibiotic spacer   HPI: Pt is a 82 y.o. female complaining of left knee pain / infection. Pain had continually increased since the beginning. X-rays in the clinic show previous TKA. Pt has tried various conservative treatments which have failed to alleviate their symptoms. Various options are discussed with the patient. Risks, benefits and expectations were discussed with the patient. Patient understand the risks, benefits and expectations and wishes to proceed with surgery.    PCP: Gayland Curry, DO  D/C Plans:      SNF  Post-op Meds:       No Rx given   Tranexamic Acid:      To be given - IV  topically  Decadron:      Is to be given  FYI:     ASA  Oxy  DME:   Pt already has equipment  PT: No   PMH: Past Medical History:  Diagnosis Date  . Anemia   . Anxiety    sometimes   . Bruise    right lower extremity with opsite on  . Calcified cerebral meningioma ([place])   . Chronic back pain    "my entire spine"  . Chronic bronchitis ([place]) 10/31/2011   "prone to it; I have sinus/bronchitis problem probably q yr"  . Complication of anesthesia 2009   htn with block- for hand surgery , pt thinks anesthesia causing some long term confusion  . Depression   . Diarrhea 01/27/2015  . Dizziness   . Dysrhythmia    pt states bring off coumadin for paroxysmal atrial fibrillation for last month  . Edema   . Encounter for long-term (current) use of other medications   . Falls   . H/O clavicle fracture    left  . Hard of hearing   . History of blood transfusion   . Hypertension    pcp   dr reed   [facility]  . Hypothyroidism   . Hypothyroidism   . Multiple skin tears 10/31/2011   "get them very easily; my skin is thin and I bruise easily"  . Other and unspecified hyperlipidemia   . Peripheral vascular disease ([place])    . Pneumonia 2008  . Rheumatoid arthritis(714.0)    degenerative disc disease , ra  . Senile osteoporosis   . Spinal stenosis, unspecified region other than cervical   . Superficial thrombophlebitis 04/20/2015  . Swelling of right upper extremity 11/26/2014  . Ulcerative colitis ([place])   . Unspecified glaucoma(365.9)     PSH: Past Surgical History:  Procedure Laterality Date  . ANTERIOR CERVICAL DECOMP/DISCECTOMY FUSION N/A 02/17/2014   Procedure: CERVICAL FOUR TO FIVE ANTERIOR CERVICAL DECOMPRESSION/DISCECTOMY FUSION 1 LEVEL;  Surgeon: [name], MD;  Location: [facility];  Service: Neurosurgery;  Laterality: N/A;  C4-5 Anterior cervical decompression/diskectomy/fusion  . BACK SURGERY    . CATARACT EXTRACTION Left 11/11/2013  . CATARACT EXTRACTION Right 12/30/2013  . EXCISIONAL TOTAL KNEE ARTHROPLASTY WITH ANTIBIOTIC SPACERS Left 03/01/2015   Procedure: RESECTION LEFT TOTAL KNEE ARTHROPLASTY WITH PLACEMENT OF  ANTIBIOTIC SPACERS;  Surgeon: Paralee Cancel, MD;  Location: [facility];  Service: Orthopedics;  Laterality: Left;  . EYE SURGERY Bilateral   . FUNCTIONAL ENDOSCOPIC SINUS SURGERY  1988  . I&D KNEE WITH POLY EXCHANGE Left 10/13/2014   Procedure: INCISION AND DRAINAGE LEFT  TOTAL KNEE WITH POLY EXCHANGE;  Surgeon: Paralee Cancel, MD;  Location: [facility];  Service: Orthopedics;  Laterality: Left;  . JOINT REPLACEMENT  2009; 2006   joints 2 fingers,rt hand; joint left thumb  . POSTERIOR FUSION LUMBAR SPINE  10/31/2011   L2-3; 3-4  . POSTERIOR FUSION LUMBAR SPINE  11/2009   L3-4;  L4-5  . POSTERIOR LUMBAR FUSION N/A 08/05/13   T1, T2  . REIMPLANTATION OF TOTAL KNEE Left 07/05/2015   Procedure: REIMPLANTATION OF LEFT TOTAL KNEE WITH REMOVAL OF ANTIBIOTIC SPACER;  Surgeon: Paralee Cancel, MD;  Location: [facility];  Service: Orthopedics;  Laterality: Left;  . SINUS SURGERY WITH INSTATRAK    . SPINE SURGERY    . THYROIDECTOMY, PARTIAL  1997  . TONSILLECTOMY     "when I was a small child"  . TOTAL  KNEE ARTHROPLASTY Left 06/16/2014   Procedure: TOTAL KNEE ARTHROPLASTY;  Surgeon: Paralee Cancel, MD;  Location: [facility];  Service: Orthopedics;  Laterality: Left;    Social History:  reports that she quit smoking about 18 months ago. Her smoking use included Cigarettes. She has a 3.00 pack-year smoking history. She has never used smokeless tobacco. She reports that she drinks alcohol. She reports that she does not use drugs.  Allergies:  No Known Allergies  Medications: No current facility-administered medications for this encounter.    Current Outpatient Prescriptions  Medication Sig Dispense Refill  . Amino Acids-Protein Hydrolys (FEEDING SUPPLEMENT, PRO-STAT SUGAR FREE 64,) LIQD Take 30 mLs by mouth 2 (two) times daily. (0900 & 2100)    . apixaban (ELIQUIS) 5 MG TABS tablet Take 1 tablet (5 mg total) by mouth 2 (two) times daily. (Patient taking differently: Take 5 mg by mouth 2 (two) times daily. (0900 & 2100)) 60 tablet 5  . balsalazide (COLAZAL) 750 MG capsule Take 2,250 mg by mouth 2 (two) times daily. (0900 & 2100)    . barrier cream (NON-SPECIFIED) CREA Apply 1 application topically daily.    Marland Kitchen buPROPion (WELLBUTRIN SR) 150 MG 12 hr tablet Take 150 mg by mouth 2 (two) times daily. In the morning & at lunch    . Calcium Carb-Cholecalciferol (CALCIUM + D3) 600-200 MG-UNIT TABS Take 1 tablet by mouth daily with breakfast. (0900)    . celecoxib (CELEBREX) 200 MG capsule Take 1 capsule (200 mg total) by mouth 2 (two) times daily. (Patient taking differently: Take 200 mg by mouth 2 (two) times daily. (0900 & 2100)) 60 capsule 5  . Dakins 0.5 % SOLN Apply 1 application topically See admin instructions. Cleanse left lower leg wound with normal saline & cleanse left knee wound with dakin's solution  Change dressing twice daily and as needed    . diazepam (VALIUM) 5 MG tablet Take 1 tablet (5 mg total) by mouth every 12 (twelve) hours. (Patient taking differently: Take 5 mg by mouth every 12  (twelve) hours. (0900 & 2100)) 10 tablet 0  . diclofenac sodium (VOLTAREN) 1 % GEL Apply 2 g topically 2 (two) times daily as needed (apply to affected area on hands).    . diltiazem (DILACOR XR) 120 MG 24 hr capsule Take 120 mg by mouth daily. (0900)    . estradiol (ESTRACE) 0.5 MG tablet Take 0.5 mg by mouth daily. (0900)  3  . feeding supplement, ENSURE ENLIVE, (ENSURE ENLIVE) LIQD Take 237 mLs by mouth 2 (two) times daily between meals. (Patient taking differently: Take 237 mLs by mouth 3 (three) times daily between meals. ) 237  mL 12  . ferrous sulfate 325 (65 FE) MG tablet Take 325 mg by mouth daily with breakfast. (0900)    . folic acid (FOLVITE) 1 MG tablet Take 2 mg by mouth daily. (0900)    . furosemide (LASIX) 40 MG tablet Take 40 mg by mouth daily. (0900)    . guaiFENesin (MUCINEX) 600 MG 12 hr tablet Take 600 mg by mouth 2 (two) times daily. (0900 & 2100)    . latanoprost (XALATAN) 0.005 % ophthalmic solution Place 1 drop into both eyes at bedtime. (2100)    . levothyroxine (SYNTHROID, LEVOTHROID) 75 MCG tablet Take 1 tablet (75 mcg total) by mouth daily. (Patient taking differently: Take 75 mcg by mouth daily at 6 (six) AM. ) 90 tablet 3  . medroxyPROGESTERone (PROVERA) 2.5 MG tablet Take 1 tablet (2.5 mg total) by mouth at bedtime. (Patient taking differently: Take 2.5 mg by mouth at bedtime. (2100)) 30 tablet 5  . oxyCODONE (OXY IR/ROXICODONE) 5 MG immediate release tablet Take 5-10 mg by mouth every 6 (six) hours as needed for moderate pain or severe pain.    . polyethylene glycol powder (GLYCOLAX/MIRALAX) powder Take 17 g by mouth daily as needed (for constipation).    . predniSONE (DELTASONE) 5 MG tablet Take 5 mg by mouth daily with breakfast. (0900)    . senna-docusate (SENOKOT-S) 8.6-50 MG tablet Take 1 tablet by mouth daily as needed (for constipation.).    Marland Kitchen sodium chloride irrigation 0.9 % irrigation Irrigate with 100 mLs as directed daily. Cleanse left lower leg wound with  normal saline & cleanse left knee wound with dakin's solution  Change dressing twice daily and as needed    . traMADol (ULTRAM) 50 MG tablet Take 1 tablet (50 mg total) by mouth every 6 (six) hours as needed for moderate pain (or Headache unrelieved by tylenol). (Patient taking differently: Take 50 mg by mouth every 6 (six) hours as needed for moderate pain. ) 10 tablet 0  . umeclidinium-vilanterol (ANORO ELLIPTA) 62.5-25 MCG/INH AEPB Inhale 1 puff into the lungs daily. (0900)    . vitamin C (ASCORBIC ACID) 500 MG tablet Take 500 mg by mouth 2 (two) times daily. (0900 & 2100)    . zinc sulfate 220 (50 Zn) MG capsule Take 220 mg by mouth daily. (0900)    . cefUROXime (CEFTIN) 500 MG tablet Take 1 tablet (500 mg total) by mouth daily. (Patient not taking: Reported on 08/21/2016) 30 tablet 11  . diltiazem (CARDIZEM CD) 180 MG 24 hr capsule Take 1 capsule (180 mg total) by mouth daily. (Patient not taking: Reported on 08/21/2016) 30 capsule 0      Review of Systems  Constitutional: Positive for malaise/fatigue.  HENT: Positive for hearing loss.   Eyes: Negative.   Respiratory: Negative.   Cardiovascular: Negative.   Gastrointestinal: Positive for constipation and diarrhea.  Genitourinary: Negative.   Musculoskeletal: Positive for back pain, joint pain and myalgias.  Skin: Negative.   Neurological: Negative.   Endo/Heme/Allergies: Negative.   Psychiatric/Behavioral: Positive for depression. The patient is nervous/anxious.        Physical Exam  Constitutional: She is oriented to person, place, and time. She appears well-developed.  HENT:  Head: Normocephalic.  Eyes: Pupils are equal, round, and reactive to light.  Neck: Neck supple. No JVD present. No tracheal deviation present. No thyromegaly present.  Cardiovascular: Normal rate, regular rhythm and intact distal pulses.   Respiratory: Effort normal and breath sounds normal. No stridor. No respiratory distress.  She has no wheezes.  GI:  Soft. There is no tenderness. There is no guarding.  Musculoskeletal:       Left knee: She exhibits decreased range of motion, swelling, laceration (healed from previous incision), erythema and bony tenderness. She exhibits no ecchymosis and no deformity. Tenderness found.  Lymphadenopathy:    She has no cervical adenopathy.  Neurological: She is alert and oriented to person, place, and time.  Skin: Skin is warm and dry.  Psychiatric: She has a normal mood and affect.       Assessment/Plan Assessment:    Recurrent infection left total knee arthroplasty   Plan: Patient will undergo a resection left TKA and placement of antibiotic spacer  on 08/29/2016 per Dr. Alvan Dame at [facility]. Risks benefits and expectations were discussed with the patient. Patient understand risks, benefits and expectations and wishes to proceed.   [name] Pugh Seddrick Flax   PA-C  08/22/2016, 11:13 PM

## 2016-08-23 ENCOUNTER — Ambulatory Visit (HOSPITAL_COMMUNITY)
Admission: RE | Admit: 2016-08-23 | Discharge: 2016-08-23 | Disposition: A | Discharge status: Home / Self Care | Payer: Medicare Other | Source: Ambulatory Visit | Attending: Anesthesiology | Admitting: Anesthesiology

## 2016-08-23 ENCOUNTER — Encounter (HOSPITAL_COMMUNITY): Payer: Self-pay

## 2016-08-23 ENCOUNTER — Encounter (HOSPITAL_COMMUNITY)
Admission: RE | Admit: 2016-08-23 | Discharge: 2016-08-23 | Disposition: A | Discharge status: Home / Self Care | Payer: Medicare Other | Source: Ambulatory Visit | Attending: Orthopedic Surgery | Admitting: Orthopedic Surgery

## 2016-08-23 DIAGNOSIS — R918 Other nonspecific abnormal finding of lung field: Secondary | ICD-10-CM | POA: Insufficient documentation

## 2016-08-23 DIAGNOSIS — Z96659 Presence of unspecified artificial knee joint: Secondary | ICD-10-CM | POA: Diagnosis not present

## 2016-08-23 DIAGNOSIS — Z01818 Encounter for other preprocedural examination: Secondary | ICD-10-CM | POA: Diagnosis not present

## 2016-08-23 DIAGNOSIS — I5033 Acute on chronic diastolic (congestive) heart failure: Secondary | ICD-10-CM | POA: Insufficient documentation

## 2016-08-23 DIAGNOSIS — Z01812 Encounter for preprocedural laboratory examination: Secondary | ICD-10-CM | POA: Diagnosis present

## 2016-08-23 DIAGNOSIS — D509 Iron deficiency anemia, unspecified: Secondary | ICD-10-CM | POA: Insufficient documentation

## 2016-08-23 DIAGNOSIS — I119 Hypertensive heart disease without heart failure: Secondary | ICD-10-CM | POA: Diagnosis not present

## 2016-08-23 DIAGNOSIS — M4712 Other spondylosis with myelopathy, cervical region: Secondary | ICD-10-CM | POA: Insufficient documentation

## 2016-08-23 DIAGNOSIS — M069 Rheumatoid arthritis, unspecified: Secondary | ICD-10-CM | POA: Diagnosis not present

## 2016-08-23 DIAGNOSIS — M4802 Spinal stenosis, cervical region: Secondary | ICD-10-CM | POA: Diagnosis not present

## 2016-08-23 DIAGNOSIS — Z7901 Long term (current) use of anticoagulants: Secondary | ICD-10-CM | POA: Diagnosis not present

## 2016-08-23 DIAGNOSIS — I4891 Unspecified atrial fibrillation: Secondary | ICD-10-CM | POA: Insufficient documentation

## 2016-08-23 DIAGNOSIS — E039 Hypothyroidism, unspecified: Secondary | ICD-10-CM | POA: Diagnosis not present

## 2016-08-23 DIAGNOSIS — K5190 Ulcerative colitis, unspecified, without complications: Secondary | ICD-10-CM | POA: Insufficient documentation

## 2016-08-23 HISTORY — DX: Dyspnea, unspecified: R06.00

## 2016-08-23 HISTORY — DX: Gastro-esophageal reflux disease without esophagitis: K21.9

## 2016-08-23 LAB — BASIC METABOLIC PANEL
ANION GAP: 9 (ref 5–15)
BUN: 34 mg/dL — ABNORMAL HIGH (ref 6–20)
CO2: 26 mmol/L (ref 22–32)
Calcium: 8.9 mg/dL (ref 8.9–10.3)
Chloride: 105 mmol/L (ref 101–111)
Creatinine, Ser: 0.72 mg/dL (ref 0.44–1.00)
GFR calc non Af Amer: >60 mL/min (ref >60)
GLUCOSE: 109 mg/dL — AB (ref 65–99)
Potassium: 3.8 mmol/L (ref 3.5–5.1)
Sodium: 140 mmol/L (ref 135–145)

## 2016-08-23 LAB — SURGICAL PCR SCREEN
MRSA, PCR: POSITIVE — AB
Staphylococcus aureus: POSITIVE — AB

## 2016-08-23 LAB — CBC
HEMATOCRIT: 32.9 % — AB (ref 36.0–46.0)
HEMOGLOBIN: 10.1 g/dL — AB (ref 12.0–15.0)
MCH: 25 pg — AB (ref 26.0–34.0)
MCHC: 30.7 g/dL (ref 30.0–36.0)
MCV: 81.4 fL (ref 78.0–100.0)
Platelets: 382 10*3/uL (ref 150–400)
RBC: 4.04 MIL/uL (ref 3.87–5.11)
RDW: 20.6 % — ABNORMAL HIGH (ref 11.5–15.5)
WBC: 7.9 10*3/uL (ref 4.0–10.5)

## 2016-08-23 NOTE — Progress Notes (Signed)
CXR done 08/23/16 faxed via epic to DR Alvan Dame along with cover sheet stating temp 99 at preop along with at 96 at preop and patient compl;ains of congestion and shortness of breath for last week.

## 2016-08-23 NOTE — Progress Notes (Signed)
CXR done 08/23/16 faxed via epic to Dr Alvan Dame along with fax cover of temp of 99.0 and patient reports shortness of breath x one week with some congestion.

## 2016-08-23 NOTE — Progress Notes (Signed)
Husband came with patient to preop visit.  Patient in wheelchair with limited mobility due to rheumatoid arthritis.  Patient is alert and oriented x 3.  Husband given 2 copies of preop instructions along with hibiclens soap x 2.  Husband aware to take one copy of preop instructions and given to [place] and one copy is for himself.

## 2016-08-23 NOTE — Progress Notes (Signed)
Dr Sabra Heck ( anesthesia) made aware of CXR results from 08/23/2016 along with temp of 99 sat96 on room air and that CXR result has been routed via epic to Dr Alvan Dame.  Alaso aware patient complained of shortness of breath at today's preop appt along with congestion.  Patient in no acute distress at time of preop appt.  No new orders given.

## 2016-08-23 NOTE — Progress Notes (Signed)
CBC and BMp done 08/23/16 faxed via epic to Dr Alvan Dame.

## 2016-08-23 NOTE — Progress Notes (Addendum)
Spoke with Collie Siad, nurse at [facility] who takes care of [facility].  Made her aware of CXR results , PCR screen results and labs done 08/23/16.  Faxed to [place] per request of Collie Siad, nurse.  Faxed to [phone].  Confirmation received.  Collie Siad stated that the PCP at [facility] would take care of ordering the Mupirocin ointment for the patient prior to surgery.   [facility] verified that lab results along with CXR have been received.

## 2016-08-23 NOTE — Progress Notes (Signed)
05/17/2016-LOV with cardiology ( NP) in epic.  EKG- 05/17/16-epic  ECHO-2016-epic CXR- 03/30/16-epic

## 2016-08-24 NOTE — Progress Notes (Signed)
Spoke with Marcie Bal , nurse for patient at [facility] and she stated they have not received any preop instructions regarding Eliquis for patient.  I told her I would notify MD office.  Left a voice mail message for [facility] regarding above.

## 2016-08-29 ENCOUNTER — Inpatient Hospital Stay (HOSPITAL_COMMUNITY): Admission: RE | Admit: 2016-08-29 | Payer: Medicare Other | Source: Ambulatory Visit | Admitting: Orthopedic Surgery

## 2016-08-29 ENCOUNTER — Encounter (HOSPITAL_COMMUNITY): Admission: RE | Payer: Self-pay | Source: Ambulatory Visit

## 2016-08-29 LAB — TYPE AND SCREEN
ABO/RH(D): B POS
Antibody Screen: NEGATIVE

## 2016-08-29 SURGERY — REMOVAL, TOTAL ARTHROPLASTY HARDWARE, KNEE, WITH ANTIBIOTIC SPACER INSERTION
Anesthesia: Spinal | Site: Knee | Laterality: Left

## 2016-09-14 ENCOUNTER — Ambulatory Visit (INDEPENDENT_AMBULATORY_CARE_PROVIDER_SITE_OTHER): Payer: Medicare Other | Admitting: Internal Medicine

## 2016-09-14 ENCOUNTER — Ambulatory Visit (INDEPENDENT_AMBULATORY_CARE_PROVIDER_SITE_OTHER)
Admission: RE | Admit: 2016-09-14 | Discharge: 2016-09-14 | Disposition: A | Discharge status: Home / Self Care | Payer: Medicare Other | Source: Ambulatory Visit | Attending: Internal Medicine | Admitting: Internal Medicine

## 2016-09-14 ENCOUNTER — Encounter: Payer: Self-pay | Admitting: Internal Medicine

## 2016-09-14 VITALS — BP 108/66 | HR 77 | Ht 66.0 in | Wt 151.0 lb

## 2016-09-14 DIAGNOSIS — J449 Chronic obstructive pulmonary disease, unspecified: Secondary | ICD-10-CM | POA: Diagnosis not present

## 2016-09-14 DIAGNOSIS — J9611 Chronic respiratory failure with hypoxia: Secondary | ICD-10-CM | POA: Diagnosis not present

## 2016-09-14 DIAGNOSIS — J90 Pleural effusion, not elsewhere classified: Secondary | ICD-10-CM

## 2016-09-14 DIAGNOSIS — R918 Other nonspecific abnormal finding of lung field: Secondary | ICD-10-CM

## 2016-09-14 NOTE — Patient Instructions (Addendum)
Best book you can buy = Being Mortal   Goal for 02 is to keep the saturations over 90% at all times   You will need to accept a higher risk of complications post op and come up with a contigency plan on what measures you would like to be instituted post op but I would strongly recommend you not be placed on longterm mechanical ventilation as it would be extremely unlikely to help you with your goal of getting back to independent living.  Please remember to go to the x-ray department downstairs in the basement  for your tests - we will call you with the results when they are available.      You do not have significant copd - pulmonary follow up is as needed  Late add:  Needs esr, cmet, bnp, quant TB and CTa preop

## 2016-09-14 NOTE — Progress Notes (Signed)
Subjective:     Patient ID: [name], female   DOB: 1934/12/05, 82 y.o.   MRN: [id number]  HPI  59 yowf with steroid dep RA, UC, AF, diastolic dysfunction  quit smoking 07/2016 with baseline doe = MMRC2 = can't walk a nl pace on a flat grade s sob but does fine slow and flat but mostly limited by knees / leg strength and referred to pulmonary clinic 09/14/2016 by Dr   [name]/  She has DNR status but needs knee surgery      09/14/2016 1st [facility] office visit/ [name]   Chief Complaint  Patient presents with  . Pulmonary Consult    Referred by Dr. Leanna [name] for pulmonary clearance for knee surgery.  Pt denies any respiratory co's today. She has been on supplemental o2 for approx 1 month.    At assisted living x one year due to legs/not breathing issues  Sleeps ok flat rx anoro / 02 2lpm 24/7  Also on prednisone "forever"   No obvious day to day or daytime variability or assoc excess/ purulent sputum or mucus plugs or hemoptysis or cp or chest tightness, subjective wheeze or overt sinus or hb symptoms. No unusual exp hx or h/o childhood pna/ asthma or knowledge of premature birth.  Sleeping ok flat without nocturnal  or early am exacerbation  of respiratory  c/o's or need for noct saba. Also denies any obvious fluctuation of symptoms with weather or environmental changes or other aggravating or alleviating factors except as outlined above   Current Medications, Allergies, Complete Past Medical History, Past Surgical History, Family History, and Social History were reviewed in [employer] record.  ROS  The following are not active complaints unless bolded sore throat, dysphagia, dental problems, itching, sneezing,  nasal congestion or excess/ purulent secretions, ear ache,   fever, chills, sweats, unintended wt loss, classically pleuritic or exertional cp,  orthopnea pnd or leg swelling, presyncope, palpitations, abdominal pain, anorexia, nausea,  vomiting, diarrhea  or change in bowel or bladder habits, change in stools or urine, dysuria,hematuria,  rash, arthralgias, visual complaints, headache, numbness, weakness or ataxia or problems with walking or coordination,  change in mood/affect or memory.            Review of Systems     Objective:   Physical Exam    very frail elderly wf, very poor insight into health care issues/ verbalizing symptoms  Wt Readings from Last 3 Encounters:  09/14/16 151 lb (68.5 kg)  05/02/16 140 lb 6.9 oz (63.7 kg)  04/10/16 131 lb (59.4 kg)    Vital signs reviewed  - Note on arrival 02 sats  97% on 2lpm      HEENT: nl dentition, turbinates bilaterally, and oropharynx. Nl external ear canals without cough reflex   NECK :  without JVD/Nodes/TM/ nl carotid upstrokes bilaterally   LUNGS: no acc muscle use,  Mild kyphosis/ distant bs with  Minimal  insp and exp rhonchi bilaterally    CV:  RRR  no s3 or murmur or increase in P2, and no edema   ABD:  soft and nontender with nl inspiratory excursion in the supine position. No bruits or organomegaly appreciated, bowel sounds nl  MS:  W/c bound/ severe djd and RA changes hands  calf tenderness, cyanosis or clubbing    SKIN: warm and dry -venous stasis changes both legs    NEURO:  alert, approp, nl sensorium with  no motor or cerebellar deficits apparent.  CXR PA and Lateral:   09/14/2016 :    I personally reviewed images and agree with radiology impression as follows:   Stable elevation of the right hemidiaphragm. Patchy airspace disease in the right upper lobe which could represent atelectasis, scarring or infiltrate. Small bilateral effusions. My impression:  The elevation of the R HD is very striking and assoc with R > L effusion ? Subpulmonic component new since 03/30/16 comparison    Labs  reviewed:      Chemistry      Component Value Date/Time   NA 140 08/23/2016 1031   NA 137 09/03/2015 1048   K 3.8 08/23/2016 1031   CL  105 08/23/2016 1031   CO2 26 08/23/2016 1031   BUN 34 (H) 08/23/2016 1031   BUN 33 (H) 09/03/2015 1048   CREATININE 0.72 08/23/2016 1031   CREATININE 1.07 (H) 09/22/2015 1630      Component Value Date/Time   CALCIUM 8.9 08/23/2016 1031   ALKPHOS 105 05/02/2016 0548   AST 27 05/02/2016 0548   ALT 15 05/02/2016 0548   BILITOT 0.4 05/02/2016 0548   BILITOT 0.4 12/25/2014 1346        Lab Results  Component Value Date   WBC 7.9 08/23/2016   HGB 10.1 (L) 08/23/2016   HCT 32.9 (L) 08/23/2016   MCV 81.4 08/23/2016   PLT 382 08/23/2016         Lab Results  Component Value Date   TSH 0.483 04/28/2016            Assessment:

## 2016-09-14 NOTE — Progress Notes (Signed)
Spoke with the pt's spouse, Barnabas Lister and notified of results. He verbalized understanding and will inform the pt.

## 2016-09-16 DIAGNOSIS — R918 Other nonspecific abnormal finding of lung field: Secondary | ICD-10-CM | POA: Insufficient documentation

## 2016-09-16 DIAGNOSIS — J90 Pleural effusion, not elsewhere classified: Secondary | ICD-10-CM | POA: Insufficient documentation

## 2016-09-16 DIAGNOSIS — J9611 Chronic respiratory failure with hypoxia: Secondary | ICD-10-CM | POA: Insufficient documentation

## 2016-09-16 NOTE — Assessment & Plan Note (Addendum)
See cxr 09/14/2016     ddx for bilateral effusions is relatively short and includes chf, for which has dx of diastolic dysfunction and low alb or related to ascites which I don't detect - at risk of RA effusion but this is usually not bilateral, and tsh is normal so hypothryoid ruled out as are bilateral PE related as already on eliquis  rec check bnp/esr/ cmet and CTa next and should be completed preop  Total time devoted to counseling  > 50 % of initial 60 min office visit:  review case with pt/ discussion of options/alternatives/ personally creating written customized instructions  in presence of pt  then going over those specific  Instructions directly with the pt including how to use all of the meds but in particular covering each new medication in detail and the difference between the maintenance= "automatic" meds and the prns using an action plan format for the latter (If this problem/symptom => do that organization reading Left to right).  Please see AVS from this visit for a full list of these instructions which I personally wrote for this pt and  are unique to this visit.

## 2016-09-16 NOTE — Assessment & Plan Note (Signed)
Spirometry 09/14/2016  FEV1 0.92 (42%)  Ratio 78 with min curvature on anoro    Despite smoking hx her changes are mostly restrictive (related to kyphosis/ pleural effusions)  with minimal obstruction and not sure the anoro is even indicated here but since she's on low dose prednisone for RA and probably benefits in terms of AB component from lama/laba so ok to continue with low threshold to change to Breo if flares become a problem in the future

## 2016-09-16 NOTE — Assessment & Plan Note (Signed)
Non specific in setting of smoking hx/ UC/ RA and diastolic dysfunction and will need w/u prior to elective surgery >  rec labs/CTa chest next

## 2016-09-16 NOTE — Assessment & Plan Note (Addendum)
Well compensated on 2lpm with no evidence of hypercarbia and not per se a contraindication to knee surgery but there is a def risk of needing escalation of care (worse case scenario is vent dep) post op so rec   1) optimize rx pre op (see separate a/p's)  2) reconsider whether knee surgery is really going to get her back to where she wants = ambulatory/ independent living which today seems an unlikely scenario to me 3) if does go thru with surgery acknowledge up front the high likelihood of prolonging suffering from pulmonary interventions vastly outweighs any reasonable chance of benefit from offering anything else because we would rapidly reach a point where medical science has reached its limits to restore health in this setting  For now just needs 02 adjusted to sat > 90%    .

## 2016-09-18 ENCOUNTER — Telehealth: Payer: Self-pay | Admitting: *Deleted

## 2016-09-18 NOTE — Telephone Encounter (Signed)
-----  Message from Tanda Rockers, MD sent at 09/16/2016  8:20 AM EDT ----- Needs esr, cmet, bnp, quant TB and CTa preop

## 2016-09-18 NOTE — Telephone Encounter (Signed)
Called and spoke with Sharyn Lull, pt's nurse  I have faxed all orders to her and she will call if anything needed

## 2016-10-11 ENCOUNTER — Other Ambulatory Visit (HOSPITAL_COMMUNITY): Payer: Self-pay | Admitting: Emergency Medicine

## 2016-10-11 NOTE — Patient Instructions (Signed)
[name]  10/11/2016   Your procedure is scheduled on: 10-16-16  Report to [facility] Main  Entrance   Report to admitting at 9:30AM   Call this number if you have problems the morning of surgery  [phone]   Remember: ONLY 1 PERSON MAY GO WITH YOU TO SHORT STAY TO GET  READY MORNING OF YOUR SURGERY.  Do not eat food or drink liquids :After Midnight.     Take these medicines the morning of surgery with A SIP OF WATER: bupropion(wellbutrin), levothyroxine(synthroid), mucinex as needed, inhaler as needed (may bring to hospital)                                 You may not have any metal on your body including hair pins and              piercings  Do not wear jewelry, make-up, lotions, powders or perfumes, deodorant             Do not wear nail polish.  Do not shave  48 hours prior to surgery.     Do not bring valuables to the hospital. [place].  Contacts, dentures or bridgework may not be worn into surgery.  Leave suitcase in the car. After surgery it may be brought to your room.               Please read over the following fact sheets you were given: _____________________________________________________________________   [facility] - Preparing for Surgery Before surgery, you can play an important role.  Because skin is not sterile, your skin needs to be as free of germs as possible.  You can reduce the number of germs on your skin by washing with CHG (chlorahexidine gluconate) soap before surgery.  CHG is an antiseptic cleaner which kills germs and bonds with the skin to continue killing germs even after washing. Please DO NOT use if you have an allergy to CHG or antibacterial soaps.  If your skin becomes reddened/irritated stop using the CHG and inform your nurse when you arrive at Short Stay. Do not shave (including legs and underarms) for at least 48 hours prior to the first CHG shower.  You may  shave your face/neck. Please follow these instructions carefully:  1.  Shower with CHG Soap the night before surgery and the  morning of Surgery.  2.  If you choose to wash your hair, wash your hair first as usual with your  normal  shampoo.  3.  After you shampoo, rinse your hair and body thoroughly to remove the  shampoo.                           4.  Use CHG as you would any other liquid soap.  You can apply chg directly  to the skin and wash                       Gently with a scrungie or clean washcloth.  5.  Apply the CHG Soap to your body ONLY FROM THE NECK DOWN.   Do not use on face/ open  Wound or open sores. Avoid contact with eyes, ears mouth and genitals (private parts).                       Wash face,  Genitals (private parts) with your normal soap.             6.  Wash thoroughly, paying special attention to the area where your surgery  will be performed.  7.  Thoroughly rinse your body with warm water from the neck down.  8.  DO NOT shower/wash with your normal soap after using and rinsing off  the CHG Soap.                9.  Pat yourself dry with a clean towel.            10.  Wear clean pajamas.            11.  Place clean sheets on your bed the night of your first shower and do not  sleep with pets. Day of Surgery : Do not apply any lotions/deodorants the morning of surgery.  Please wear clean clothes to the hospital/surgery center.  FAILURE TO FOLLOW THESE INSTRUCTIONS MAY RESULT IN THE CANCELLATION OF YOUR SURGERY PATIENT SIGNATURE_________________________________  NURSE SIGNATURE__________________________________  ________________________________________________________________________   [name]  An incentive spirometer is a tool that can help keep your lungs clear and active. This tool measures how well you are filling your lungs with each breath. Taking long deep breaths may help reverse or decrease the chance of developing  breathing (pulmonary) problems (especially infection) following:  A long period of time when you are unable to move or be active. BEFORE THE PROCEDURE   If the spirometer includes an indicator to show your best effort, your nurse or respiratory therapist will set it to a desired goal.  If possible, sit up straight or lean slightly forward. Try not to slouch.  Hold the incentive spirometer in an upright position. INSTRUCTIONS FOR USE  1. Sit on the edge of your bed if possible, or sit up as far as you can in bed or on a chair. 2. Hold the incentive spirometer in an upright position. 3. Breathe out normally. 4. Place the mouthpiece in your mouth and seal your lips tightly around it. 5. Breathe in slowly and as deeply as possible, raising the piston or the ball toward the top of the column. 6. Hold your breath for 3-5 seconds or for as long as possible. Allow the piston or ball to fall to the bottom of the column. 7. Remove the mouthpiece from your mouth and breathe out normally. 8. Rest for a few seconds and repeat Steps 1 through 7 at least 10 times every 1-2 hours when you are awake. Take your time and take a few normal breaths between deep breaths. 9. The spirometer may include an indicator to show your best effort. Use the indicator as a goal to work toward during each repetition. 10. After each set of 10 deep breaths, practice coughing to be sure your lungs are clear. If you have an incision (the cut made at the time of surgery), support your incision when coughing by placing a pillow or rolled up towels firmly against it. Once you are able to get out of bed, walk around indoors and cough well. You may stop using the incentive spirometer when instructed by your caregiver.  RISKS AND COMPLICATIONS  Take your time so you do not get  dizzy or light-headed.  If you are in pain, you may need to take or ask for pain medication before doing incentive spirometry. It is harder to take a deep  breath if you are having pain. AFTER USE  Rest and breathe slowly and easily.  It can be helpful to keep track of a log of your progress. Your caregiver can provide you with a simple table to help with this. If you are using the spirometer at home, follow these instructions: [place] IF:   You are having difficultly using the spirometer.  You have trouble using the spirometer as often as instructed.  Your pain medication is not giving enough relief while using the spirometer.  You develop fever of 100.5 F (38.1 C) or higher. SEEK IMMEDIATE MEDICAL CARE IF:   You cough up bloody sputum that had not been present before.  You develop fever of 102 F (38.9 C) or greater.  You develop worsening pain at or near the incision site. MAKE SURE YOU:   Understand these instructions.  Will watch your condition.  Will get help right away if you are not doing well or get worse. Document Released: 07/10/2006 Document Revised: 05/22/2011 Document Reviewed: 09/10/2006 ExitCare Patient Information 2014 ExitCare, [place].   ________________________________________________________________________  WHAT IS A BLOOD TRANSFUSION? Blood Transfusion Information  A transfusion is the replacement of blood or some of its parts. Blood is made up of multiple cells which provide different functions.  Red blood cells carry oxygen and are used for blood loss replacement.  White blood cells fight against infection.  Platelets control bleeding.  Plasma helps clot blood.  Other blood products are available for specialized needs, such as hemophilia or other clotting disorders. BEFORE THE TRANSFUSION  Who gives blood for transfusions?   Healthy volunteers who are fully evaluated to make sure their blood is safe. This is blood bank blood. Transfusion therapy is the safest it has ever been in the practice of medicine. Before blood is taken from a donor, a complete history is taken to make sure  that person has no history of diseases nor engages in risky social behavior (examples are intravenous drug use or sexual activity with multiple partners). The donor's travel history is screened to minimize risk of transmitting infections, such as malaria. The donated blood is tested for signs of infectious diseases, such as HIV and hepatitis. The blood is then tested to be sure it is compatible with you in order to minimize the chance of a transfusion reaction. If you or a relative donates blood, this is often done in anticipation of surgery and is not appropriate for emergency situations. It takes many days to process the donated blood. RISKS AND COMPLICATIONS Although transfusion therapy is very safe and saves many lives, the main dangers of transfusion include:   Getting an infectious disease.  Developing a transfusion reaction. This is an allergic reaction to something in the blood you were given. Every precaution is taken to prevent this. The decision to have a blood transfusion has been considered carefully by your caregiver before blood is given. Blood is not given unless the benefits outweigh the risks. AFTER THE TRANSFUSION  Right after receiving a blood transfusion, you will usually feel much better and more energetic. This is especially true if your red blood cells have gotten low (anemic). The transfusion raises the level of the red blood cells which carry oxygen, and this usually causes an energy increase.  The nurse administering the transfusion will  monitor you carefully for complications. HOME CARE INSTRUCTIONS  No special instructions are needed after a transfusion. You may find your energy is better. Speak with your caregiver about any limitations on activity for underlying diseases you may have. SEEK MEDICAL CARE IF:   Your condition is not improving after your transfusion.  You develop redness or irritation at the intravenous (IV) site. SEEK IMMEDIATE MEDICAL CARE IF:  Any of  the following symptoms occur over the next 12 hours:  Shaking chills.  You have a temperature by mouth above 102 F (38.9 C), not controlled by medicine.  Chest, back, or muscle pain.  People around you feel you are not acting correctly or are confused.  Shortness of breath or difficulty breathing.  Dizziness and fainting.  You get a rash or develop hives.  You have a decrease in urine output.  Your urine turns a dark color or changes to pink, red, or brown. Any of the following symptoms occur over the next 10 days:  You have a temperature by mouth above 102 F (38.9 C), not controlled by medicine.  Shortness of breath.  Weakness after normal activity.  The white part of the eye turns yellow (jaundice).  You have a decrease in the amount of urine or are urinating less often.  Your urine turns a dark color or changes to pink, red, or brown. Document Released: 02/25/2000 Document Revised: 05/22/2011 Document Reviewed: 10/14/2007 [facility] Patient Information 2014 [place], [place].  _______________________________________________________________________

## 2016-10-11 NOTE — Progress Notes (Addendum)
Surgical clearance int. Med. Dr Philip Aspen pending pulmonology clearance on chart   [facility] Pulmonology Dr Melvyn Novas 09-14-16 epic : note reads  "Well compensated on 2lpm with no evidence of hypercarbia and not per se a contraindication to knee surgery but there is a def risk of needing escalation of care (worse case scenario is vent dep) post op so rec   1) optimize rx pre op (see separate a/p's)  2) reconsider whether knee surgery is really going to get her back to where she wants = ambulatory/ independent living which today seems an unlikely scenario to me 3) if does go thru with surgery acknowledge up front the high likelihood of prolonging suffering from pulmonary interventions vastly outweighs any reasonable chance of benefit from offering anything else because we would rapidly reach a point where medical science has reached its limits to restore health in this setting" (....) "rec check bnp/esr/ cmet and CTa next and should be completed preop"  CXR 09-14-16 epic  EKG 05-17-16 epic  CBCdiff 09-26-16 on chart   ECHO 04-17-2014 "Aortic valve:  Trileaflet; mildly calcified leaflets. Doppler: There was no stenosis.  There was no regurgitation."

## 2016-10-12 ENCOUNTER — Encounter (HOSPITAL_COMMUNITY)
Admission: RE | Admit: 2016-10-12 | Discharge: 2016-10-12 | Disposition: A | Discharge status: Home / Self Care | Payer: Medicare Other | Source: Ambulatory Visit | Attending: Orthopedic Surgery | Admitting: Orthopedic Surgery

## 2016-10-12 ENCOUNTER — Encounter (HOSPITAL_COMMUNITY): Payer: Self-pay

## 2016-10-12 DIAGNOSIS — Z01812 Encounter for preprocedural laboratory examination: Secondary | ICD-10-CM | POA: Insufficient documentation

## 2016-10-12 DIAGNOSIS — Y838 Other surgical procedures as the cause of abnormal reaction of the patient, or of later complication, without mention of misadventure at the time of the procedure: Secondary | ICD-10-CM | POA: Diagnosis not present

## 2016-10-12 DIAGNOSIS — T8454XA Infection and inflammatory reaction due to internal left knee prosthesis, initial encounter: Secondary | ICD-10-CM | POA: Insufficient documentation

## 2016-10-12 DIAGNOSIS — Z0183 Encounter for blood typing: Secondary | ICD-10-CM | POA: Insufficient documentation

## 2016-10-12 LAB — SURGICAL PCR SCREEN
MRSA, PCR: POSITIVE — AB
Staphylococcus aureus: POSITIVE — AB

## 2016-10-12 LAB — COMPREHENSIVE METABOLIC PANEL
ALBUMIN: 2.8 g/dL — AB (ref 3.5–5.0)
ALK PHOS: 134 U/L — AB (ref 38–126)
ALT: 17 U/L (ref 14–54)
ANION GAP: 10 (ref 5–15)
AST: 27 U/L (ref 15–41)
BILIRUBIN TOTAL: 0.5 mg/dL (ref 0.3–1.2)
BUN: 30 mg/dL — AB (ref 6–20)
CALCIUM: 9 mg/dL (ref 8.9–10.3)
CO2: 26 mmol/L (ref 22–32)
CREATININE: 0.69 mg/dL (ref 0.44–1.00)
Chloride: 105 mmol/L (ref 101–111)
GFR calc Af Amer: >60 mL/min (ref >60)
GFR calc non Af Amer: >60 mL/min (ref >60)
GLUCOSE: 101 mg/dL — AB (ref 65–99)
Potassium: 4.3 mmol/L (ref 3.5–5.1)
Sodium: 141 mmol/L (ref 135–145)
Total Protein: 6.2 g/dL — ABNORMAL LOW (ref 6.5–8.1)

## 2016-10-12 NOTE — Progress Notes (Signed)
Spoke with [employer] from [employer]  . She was calling to know if PST could complete CT scan and labs if Dr Alvan Dame placed orders in epic. RN checked with [facility] lab personnel and ascertained that only the BNP and ESR could be checked here. RN also informed velvet that radiology has its own schedule for CT scans and olin would have to directly contact them to schedule patient for a CT scan . Velvet verbalized understanding and states that Alvan Dame is currently in office trying to get in contact with Dr Melvyn Novas . RN told Velvet to contact PST as needed.

## 2016-10-12 NOTE — Progress Notes (Signed)
RN consulted with anesthesia regarding pulmonary Dr Gustavus Bryant recommendations for Mrs Justman before proceeding with her upcoming surgery. Anesthesia Dr Gifford Shave personally reviewed Mrs Vanhecke's record in epic and reviewed Dr Gustavus Bryant AVS note. Dr Lurline Del says that anesthesia will not be putting in the orders for lab tests (ie BNPeptide, quant TB, etc.) nor a CT scan that Dr Melvyn Novas advised surgeon to complete; the patient's surgeon will need to complete those tests if they so choose to.   RN called over to [facility] and LVMM for [employer] Judeen Hammans is out of office) to make aware .

## 2016-10-13 ENCOUNTER — Telehealth: Payer: Self-pay | Admitting: Internal Medicine

## 2016-10-13 NOTE — H&P (Signed)
[name] is an 82 y.o. female.    Chief Complaint:   Recurrent infection left total knee arthroplasty  Procedure:   Resection left TKA and placement of articulating antibiotic spacer  HPI: Pt is a 82 y.o. female complaining of left knee pain and left LE drainage. Pain / drainage had continually increased since the beginning. X-rays in the clinic show previous TKA. Pt has tried various conservative treatments which have failed to alleviate their symptoms. Various options are discussed with the patient.  She relates that she knows that she is of high risks for surgery.  Risks, benefits and expectations were discussed with the patient and family. Patient and family understand the risks, benefits and expectations and wishes to proceed with surgery.    PCP: Leanna Battles, MD  D/C Plans:       SNF  Post-op Meds:       No Rx given   Tranexamic Acid:      To be given - IV   Decadron:      Is to be given  FYI:     ASA  Oxycodone  MiraLax and colace as needed, patient has UC   DME:   Pt already has equipment   PMH: Past Medical History:  Diagnosis Date  . Anemia   . Anxiety    sometimes   . Arthritis    rheumatoid arthritis   . Bruise    right lower extremity with opsite on  . Calcified cerebral meningioma ([place])   . Chronic back pain    "my entire spine"  . Chronic bronchitis ([place]) 10/31/2011   "prone to it; I have sinus/bronchitis problem probably q yr"  . Complication of anesthesia 2009   htn with block- for hand surgery , pt thinks anesthesia causing some long term confusion  . Depression   . Diarrhea 01/27/2015  . Dizziness   . Dyspnea    for last week per patient non prod cough   . Dysrhythmia    pt states bring off coumadin for paroxysmal atrial fibrillation for last month  . Edema   . Encounter for long-term (current) use of other medications   . Falls   . GERD (gastroesophageal reflux disease)   . H/O clavicle fracture    left  . Hard of hearing     . History of blood transfusion   . Hypertension    pcp   dr reed   [facility]  . Hypothyroidism   . Hypothyroidism   . Multiple skin tears 10/31/2011   "get them very easily; my skin is thin and I bruise easily"  . Other and unspecified hyperlipidemia   . Peripheral vascular disease ([place])   . Pneumonia 2008  . Rheumatoid arthritis(714.0)    degenerative disc disease , ra  . Senile osteoporosis   . Spinal stenosis, unspecified region other than cervical   . Superficial thrombophlebitis 04/20/2015  . Swelling of right upper extremity 11/26/2014  . Ulcerative colitis ([place])   . Unspecified glaucoma(365.9)     PSH: Past Surgical History:  Procedure Laterality Date  . ANTERIOR CERVICAL DECOMP/DISCECTOMY FUSION N/A 02/17/2014   Procedure: CERVICAL FOUR TO FIVE ANTERIOR CERVICAL DECOMPRESSION/DISCECTOMY FUSION 1 LEVEL;  Surgeon: [name], MD;  Location: [facility];  Service: Neurosurgery;  Laterality: N/A;  C4-5 Anterior cervical decompression/diskectomy/fusion  . BACK SURGERY    . CATARACT EXTRACTION Left 11/11/2013  . CATARACT EXTRACTION Right 12/30/2013  . EXCISIONAL TOTAL KNEE ARTHROPLASTY WITH ANTIBIOTIC SPACERS  Left 03/01/2015   Procedure: RESECTION LEFT TOTAL KNEE ARTHROPLASTY WITH PLACEMENT OF  ANTIBIOTIC SPACERS;  Surgeon: Paralee Cancel, MD;  Location: [facility];  Service: Orthopedics;  Laterality: Left;  . EYE SURGERY Bilateral   . FUNCTIONAL ENDOSCOPIC SINUS SURGERY  1988  . I&D KNEE WITH POLY EXCHANGE Left 10/13/2014   Procedure: INCISION AND DRAINAGE LEFT TOTAL KNEE WITH POLY EXCHANGE;  Surgeon: Paralee Cancel, MD;  Location: [facility];  Service: Orthopedics;  Laterality: Left;  . JOINT REPLACEMENT  2009; 2006   joints 2 fingers,rt hand; joint left thumb  . POSTERIOR FUSION LUMBAR SPINE  10/31/2011   L2-3; 3-4  . POSTERIOR FUSION LUMBAR SPINE  11/2009   L3-4;  L4-5  . POSTERIOR LUMBAR FUSION N/A 08/05/13   T1, T2  . REIMPLANTATION OF TOTAL KNEE Left 07/05/2015   Procedure:  REIMPLANTATION OF LEFT TOTAL KNEE WITH REMOVAL OF ANTIBIOTIC SPACER;  Surgeon: Paralee Cancel, MD;  Location: [facility];  Service: Orthopedics;  Laterality: Left;  . SINUS SURGERY WITH INSTATRAK    . SPINE SURGERY    . THYROIDECTOMY, PARTIAL  1997  . TONSILLECTOMY     "when I was a small child"  . TOTAL KNEE ARTHROPLASTY Left 06/16/2014   Procedure: TOTAL KNEE ARTHROPLASTY;  Surgeon: Paralee Cancel, MD;  Location: [facility];  Service: Orthopedics;  Laterality: Left;    Social History:  reports that she quit smoking about 3 months ago. Her smoking use included Cigarettes. She has a 3.00 pack-year smoking history. She has never used smokeless tobacco. She reports that she does not drink alcohol or use drugs.  Allergies:  No Known Allergies  Medications: No current facility-administered medications for this encounter.    Current Outpatient Prescriptions  Medication Sig Dispense Refill  . balsalazide (COLAZAL) 750 MG capsule Take 2,250 mg by mouth 2 (two) times daily. (0900 & 2100)    . barrier cream (NON-SPECIFIED) CREA Apply 1 application topically daily.    Marland Kitchen buPROPion (WELLBUTRIN SR) 150 MG 12 hr tablet Take 150 mg by mouth 2 (two) times daily. In the morning & at lunch    . Calcium Carb-Cholecalciferol (CALCIUM + D3) 600-200 MG-UNIT TABS Take 1 tablet by mouth daily with breakfast. (0900)    . celecoxib (CELEBREX) 200 MG capsule Take 1 capsule (200 mg total) by mouth 2 (two) times daily. (Patient taking differently: Take 200 mg by mouth 2 (two) times daily. (0900 & 2100)) 60 capsule 5  . Dakins 0.5 % SOLN Apply 1 application topically daily as needed (for left knee). Apply moisten gauze and cover with foam dressing daily--apply silver alginate.    . diazepam (VALIUM) 5 MG tablet Take 1 tablet (5 mg total) by mouth every 12 (twelve) hours. (Patient taking differently: Take 5 mg by mouth every 12 (twelve) hours. (0900 & 2100)) 10 tablet 0  . diclofenac sodium (VOLTAREN) 1 % GEL Apply 2 g topically 2  (two) times daily as needed (for pain.).    Marland Kitchen diltiazem (DILACOR XR) 120 MG 24 hr capsule Take 120 mg by mouth daily. (0900)    . estradiol (ESTRACE) 0.5 MG tablet Take 0.5 mg by mouth daily. (0900)  3  . feeding supplement, ENSURE ENLIVE, (ENSURE ENLIVE) LIQD Take 237 mLs by mouth 2 (two) times daily between meals. (Patient taking differently: Take 237 mLs by mouth 3 (three) times daily between meals. ) 237 mL 12  . ferrous sulfate 325 (65 FE) MG tablet Take 325 mg by mouth daily with breakfast. (0900)    .  folic acid (FOLVITE) 1 MG tablet Take 2 mg by mouth daily. (0900)    . furosemide (LASIX) 40 MG tablet Take 40 mg by mouth daily. (0900)    . guaiFENesin (MUCINEX) 600 MG 12 hr tablet Take 600 mg by mouth 2 (two) times daily. (0900 & 2100)    . hydrOXYzine (VISTARIL) 25 MG capsule Take 25 mg by mouth at bedtime.    Marland Kitchen latanoprost (XALATAN) 0.005 % ophthalmic solution Place 1 drop into both eyes at bedtime. (2100)    . levothyroxine (SYNTHROID, LEVOTHROID) 75 MCG tablet Take 1 tablet (75 mcg total) by mouth daily. (Patient taking differently: Take 75 mcg by mouth daily at 6 (six) AM. ) 90 tablet 3  . medroxyPROGESTERone (PROVERA) 2.5 MG tablet Take 1 tablet (2.5 mg total) by mouth at bedtime. (Patient taking differently: Take 2.5 mg by mouth at bedtime. (2100)) 30 tablet 5  . oxyCODONE (OXY IR/ROXICODONE) 5 MG immediate release tablet Take 5-10 mg by mouth every 6 (six) hours as needed for moderate pain or severe pain.    . OXYGEN Inhale 2 L into the lungs continuous.    . polyethylene glycol powder (GLYCOLAX/MIRALAX) powder Take 17 g by mouth daily as needed (for constipation).    . predniSONE (DELTASONE) 5 MG tablet Take 5 mg by mouth daily with breakfast. (0900)    . senna-docusate (SENOKOT-S) 8.6-50 MG tablet Take 1 tablet by mouth daily as needed (for constipation.).    Marland Kitchen umeclidinium-vilanterol (ANORO ELLIPTA) 62.5-25 MCG/INH AEPB Inhale 1 puff into the lungs daily. (0900)    . vitamin C  (ASCORBIC ACID) 500 MG tablet Take 500 mg by mouth 2 (two) times daily. (0900 & 2100)    . zinc sulfate 220 (50 Zn) MG capsule Take 220 mg by mouth daily. (0900)    . apixaban (ELIQUIS) 5 MG TABS tablet Take 1 tablet (5 mg total) by mouth 2 (two) times daily. (Patient not taking: Reported on 10/11/2016) 60 tablet 5  . diltiazem (CARDIZEM CD) 180 MG 24 hr capsule Take 1 capsule (180 mg total) by mouth daily. (Patient not taking: Reported on 10/11/2016) 30 capsule 0  . traMADol (ULTRAM) 50 MG tablet Take 1 tablet (50 mg total) by mouth every 6 (six) hours as needed for moderate pain (or Headache unrelieved by tylenol). (Patient not taking: Reported on 10/11/2016) 10 tablet 0    Results for orders placed or performed during the hospital encounter of 10/12/16 (from the past 48 hour(s))  Surgical pcr screen     Status: Abnormal   Collection Time: 10/12/16 10:20 AM  Result Value Ref Range   MRSA, PCR POSITIVE (A) NEGATIVE    Comment: RESULT CALLED TO, READ BACK BY AND VERIFIED WITH: K.STANLEY [id number] A.[email or web address]    Staphylococcus aureus POSITIVE (A) NEGATIVE    Comment:        The Xpert SA Assay (FDA approved for NASAL specimens in patients over 3 years of age), is one component of a comprehensive surveillance program.  Test performance has been validated by [facility] for patients greater than or equal to 48 year old. It is not intended to diagnose infection nor to guide or monitor treatment.   Type and screen Order type and screen if day of surgery is less than 15 days from draw of preadmission visit or order morning of surgery if day of surgery is greater than 6 days from preadmission visit.     Status: None   Collection Time: 10/12/16 11:02 AM  Result Value Ref Range   ABO/RH(D) B POS    Antibody Screen NEG    Sample Expiration 10/26/2016    Extend sample reason NO TRANSFUSIONS OR PREGNANCY IN THE PAST 3 MONTHS   Comprehensive metabolic panel     Status: Abnormal   Collection Time:  10/12/16 11:16 AM  Result Value Ref Range   Sodium 141 135 - 145 mmol/L   Potassium 4.3 3.5 - 5.1 mmol/L   Chloride 105 101 - 111 mmol/L   CO2 26 22 - 32 mmol/L   Glucose, Bld 101 (H) 65 - 99 mg/dL   BUN 30 (H) 6 - 20 mg/dL   Creatinine, Ser 0.69 0.44 - 1.00 mg/dL   Calcium 9.0 8.9 - 10.3 mg/dL   Total Protein 6.2 (L) 6.5 - 8.1 g/dL   Albumin 2.8 (L) 3.5 - 5.0 g/dL   AST 27 15 - 41 U/L   ALT 17 14 - 54 U/L   Alkaline Phosphatase 134 (H) 38 - 126 U/L   Total Bilirubin 0.5 0.3 - 1.2 mg/dL   GFR calc non Af Amer >60 >60 mL/min   GFR calc Af Amer >60 >60 mL/min    Comment: (NOTE) The eGFR has been calculated using the CKD EPI equation. This calculation has not been validated in all clinical situations. eGFR's persistently <60 mL/min signify possible Chronic Kidney Disease.    Anion gap 10 5 - 15      Review of Systems  Constitutional: Negative.   HENT: Positive for hearing loss.   Eyes: Negative.   Respiratory: Negative.   Cardiovascular: Negative.   Gastrointestinal: Positive for constipation, diarrhea and heartburn.  Genitourinary: Positive for frequency.  Musculoskeletal: Positive for back pain and joint pain.  Skin: Negative.   Neurological: Negative.   Endo/Heme/Allergies: Negative.   Psychiatric/Behavioral: Positive for depression. The patient is nervous/anxious.        Physical Exam  Constitutional: She is oriented to person, place, and time. She appears well-developed.  HENT:  Head: Normocephalic.  Eyes: Pupils are equal, round, and reactive to light.  Neck: Neck supple. No JVD present. No tracheal deviation present. No thyromegaly present.  Cardiovascular: Normal rate, regular rhythm and intact distal pulses.   Respiratory: Effort normal and breath sounds normal. No respiratory distress. She has no wheezes.  GI: Soft. There is no tenderness. There is no guarding.  Musculoskeletal:       Left knee: She exhibits decreased range of motion and laceration  (healed previous incision). She exhibits no ecchymosis and no deformity. Tenderness found.       Legs: Lymphadenopathy:    She has no cervical adenopathy.  Neurological: She is alert and oriented to person, place, and time.  Skin: Skin is warm and dry.  Psychiatric: She has a normal mood and affect.       Assessment/Plan Assessment: Recurrent infection left total knee arthroplasty   Plan: Patient will undergo a resection left TKA and placement of articulating antibiotic spacer on 10/16/2016 per Dr. Alvan Dame at [facility]. Risks benefits and expectations were discussed with the patient. Patient understand risks, benefits and expectations and wishes to proceed.     West Pugh Lorren Rossetti   PA-C  10/13/2016, 12:07 PM

## 2016-10-13 NOTE — Telephone Encounter (Signed)
Note    ----- Message from Tanda Rockers, MD sent at 09/16/2016  8:20 AM EDT ----- Needs esr, cmet, bnp, quant TB and CTa preop      I have called and lmomtcb for sherry from [facility].

## 2016-10-13 NOTE — Telephone Encounter (Signed)
I stand by my note and my opinion has not changed There is a definite risk of doing surgery she and her fm need to understand / accept and limit to what medical science can do   They need appropriate expectations and contingency plans eg ncb post op (unless willing to risk chronic vent dep resp failure)   No additional tests needed at this point

## 2016-10-13 NOTE — Telephone Encounter (Signed)
Called and spoke with Judeen Hammans and she is aware of message from [facility] [place].  She will give this information to Dr. Alvan Dame.

## 2016-10-13 NOTE — Telephone Encounter (Signed)
Dr. Alvan Dame wanted to know if the pt needs to have these tests done proir to surgery ----she is scheduled to have surgery on Monday am----MW please advise. Thanks   [name] stated that the surgery has been rescheduled several times and that she is in a lot of pain and wants to have this surgery---they also want to know if you can clear her for the surgery.

## 2016-10-16 ENCOUNTER — Inpatient Hospital Stay (HOSPITAL_COMMUNITY): Payer: Medicare Other | Admitting: Anesthesiology

## 2016-10-16 ENCOUNTER — Encounter (HOSPITAL_COMMUNITY): Payer: Self-pay | Admitting: *Deleted

## 2016-10-16 ENCOUNTER — Inpatient Hospital Stay (HOSPITAL_COMMUNITY)
Admission: RE | Admit: 2016-10-16 | Discharge: 2016-10-20 | DRG: 465 | Disposition: A | Discharge status: Home Health | Payer: Medicare Other | Source: Ambulatory Visit | Attending: Orthopedic Surgery | Admitting: Orthopedic Surgery

## 2016-10-16 ENCOUNTER — Encounter (HOSPITAL_COMMUNITY)
Admission: RE | Disposition: A | Discharge status: Home Health | Payer: Self-pay | Source: Ambulatory Visit | Attending: Orthopedic Surgery

## 2016-10-16 DIAGNOSIS — H409 Unspecified glaucoma: Secondary | ICD-10-CM | POA: Diagnosis present

## 2016-10-16 DIAGNOSIS — Z9842 Cataract extraction status, left eye: Secondary | ICD-10-CM | POA: Diagnosis not present

## 2016-10-16 DIAGNOSIS — G8929 Other chronic pain: Secondary | ICD-10-CM | POA: Diagnosis present

## 2016-10-16 DIAGNOSIS — E039 Hypothyroidism, unspecified: Secondary | ICD-10-CM | POA: Diagnosis present

## 2016-10-16 DIAGNOSIS — M549 Dorsalgia, unspecified: Secondary | ICD-10-CM | POA: Diagnosis present

## 2016-10-16 DIAGNOSIS — Z9841 Cataract extraction status, right eye: Secondary | ICD-10-CM

## 2016-10-16 DIAGNOSIS — E785 Hyperlipidemia, unspecified: Secondary | ICD-10-CM | POA: Diagnosis present

## 2016-10-16 DIAGNOSIS — H9190 Unspecified hearing loss, unspecified ear: Secondary | ICD-10-CM | POA: Diagnosis present

## 2016-10-16 DIAGNOSIS — Z9981 Dependence on supplemental oxygen: Secondary | ICD-10-CM | POA: Diagnosis not present

## 2016-10-16 DIAGNOSIS — Y831 Surgical operation with implant of artificial internal device as the cause of abnormal reaction of the patient, or of later complication, without mention of misadventure at the time of the procedure: Secondary | ICD-10-CM | POA: Diagnosis present

## 2016-10-16 DIAGNOSIS — F22 Delusional disorders: Secondary | ICD-10-CM | POA: Diagnosis present

## 2016-10-16 DIAGNOSIS — A4901 Methicillin susceptible Staphylococcus aureus infection, unspecified site: Secondary | ICD-10-CM | POA: Diagnosis not present

## 2016-10-16 DIAGNOSIS — K219 Gastro-esophageal reflux disease without esophagitis: Secondary | ICD-10-CM | POA: Diagnosis present

## 2016-10-16 DIAGNOSIS — F419 Anxiety disorder, unspecified: Secondary | ICD-10-CM | POA: Diagnosis present

## 2016-10-16 DIAGNOSIS — Z7901 Long term (current) use of anticoagulants: Secondary | ICD-10-CM | POA: Diagnosis not present

## 2016-10-16 DIAGNOSIS — F329 Major depressive disorder, single episode, unspecified: Secondary | ICD-10-CM | POA: Diagnosis present

## 2016-10-16 DIAGNOSIS — M810 Age-related osteoporosis without current pathological fracture: Secondary | ICD-10-CM | POA: Diagnosis present

## 2016-10-16 DIAGNOSIS — I480 Paroxysmal atrial fibrillation: Secondary | ICD-10-CM | POA: Diagnosis present

## 2016-10-16 DIAGNOSIS — Z89529 Acquired absence of unspecified knee: Secondary | ICD-10-CM | POA: Diagnosis present

## 2016-10-16 DIAGNOSIS — B9561 Methicillin susceptible Staphylococcus aureus infection as the cause of diseases classified elsewhere: Secondary | ICD-10-CM | POA: Diagnosis present

## 2016-10-16 DIAGNOSIS — T8450XA Infection and inflammatory reaction due to unspecified internal joint prosthesis, initial encounter: Secondary | ICD-10-CM | POA: Diagnosis not present

## 2016-10-16 DIAGNOSIS — Z79899 Other long term (current) drug therapy: Secondary | ICD-10-CM

## 2016-10-16 DIAGNOSIS — M25562 Pain in left knee: Secondary | ICD-10-CM | POA: Diagnosis present

## 2016-10-16 DIAGNOSIS — I739 Peripheral vascular disease, unspecified: Secondary | ICD-10-CM | POA: Diagnosis present

## 2016-10-16 DIAGNOSIS — Z981 Arthrodesis status: Secondary | ICD-10-CM

## 2016-10-16 DIAGNOSIS — Z87891 Personal history of nicotine dependence: Secondary | ICD-10-CM

## 2016-10-16 DIAGNOSIS — M069 Rheumatoid arthritis, unspecified: Secondary | ICD-10-CM | POA: Diagnosis present

## 2016-10-16 DIAGNOSIS — T8454XA Infection and inflammatory reaction due to internal left knee prosthesis, initial encounter: Secondary | ICD-10-CM | POA: Diagnosis present

## 2016-10-16 DIAGNOSIS — I10 Essential (primary) hypertension: Secondary | ICD-10-CM | POA: Diagnosis present

## 2016-10-16 DIAGNOSIS — J449 Chronic obstructive pulmonary disease, unspecified: Secondary | ICD-10-CM | POA: Diagnosis present

## 2016-10-16 HISTORY — PX: EXCISIONAL TOTAL KNEE ARTHROPLASTY WITH ANTIBIOTIC SPACERS: SHX5827

## 2016-10-16 LAB — TYPE AND SCREEN
ABO/RH(D): B POS
Antibody Screen: NEGATIVE

## 2016-10-16 SURGERY — REMOVAL, TOTAL ARTHROPLASTY HARDWARE, KNEE, WITH ANTIBIOTIC SPACER INSERTION
Anesthesia: General | Site: Knee | Laterality: Left

## 2016-10-16 MED ORDER — METHOCARBAMOL 500 MG PO TABS: 500.0000 mg | ORAL_TABLET | Freq: Four times a day (QID) | ORAL | Status: DC | PRN

## 2016-10-16 MED ORDER — LEVOTHYROXINE SODIUM 75 MCG PO TABS
75.0000 ug | ORAL_TABLET | Freq: Every day | ORAL | Status: DC
  Administered 2016-10-17 – 2016-10-19 (×3): 75 ug via ORAL
  Filled 2016-10-16 (×4): qty 1

## 2016-10-16 MED ORDER — ONDANSETRON HCL 4 MG PO TABS: 4.0000 mg | ORAL_TABLET | Freq: Four times a day (QID) | ORAL | Status: DC | PRN

## 2016-10-16 MED ORDER — VANCOMYCIN HCL 1000 MG IV SOLR
INTRAVENOUS | Status: AC
  Filled 2016-10-16: qty 3000

## 2016-10-16 MED ORDER — DIAZEPAM 5 MG PO TABS
5.0000 mg | ORAL_TABLET | Freq: Two times a day (BID) | ORAL | Status: DC
  Administered 2016-10-16 – 2016-10-18 (×4): 5 mg via ORAL
  Filled 2016-10-16 (×5): qty 1

## 2016-10-16 MED ORDER — PREDNISONE 5 MG PO TABS
5.0000 mg | ORAL_TABLET | Freq: Every day | ORAL | Status: DC
  Administered 2016-10-17 – 2016-10-19 (×3): 5 mg via ORAL
  Filled 2016-10-16 (×3): qty 1

## 2016-10-16 MED ORDER — ASPIRIN 81 MG PO CHEW
81.0000 mg | CHEWABLE_TABLET | Freq: Two times a day (BID) | ORAL | Status: DC
  Administered 2016-10-16 – 2016-10-19 (×6): 81 mg via ORAL
  Filled 2016-10-16 (×7): qty 1

## 2016-10-16 MED ORDER — TOBRAMYCIN SULFATE 1.2 G IJ SOLR
INTRAMUSCULAR | Status: AC
  Filled 2016-10-16: qty 3.6

## 2016-10-16 MED ORDER — VANCOMYCIN HCL 1000 MG IV SOLR
INTRAVENOUS | Status: DC | PRN
  Administered 2016-10-16: 5000 mg

## 2016-10-16 MED ORDER — METOCLOPRAMIDE HCL 5 MG PO TABS: 5.0000 mg | ORAL_TABLET | Freq: Three times a day (TID) | ORAL | Status: DC | PRN

## 2016-10-16 MED ORDER — TOBRAMYCIN SULFATE 1.2 G IJ SOLR
INTRAMUSCULAR | Status: DC | PRN
  Administered 2016-10-16: 4.8 mg

## 2016-10-16 MED ORDER — CEFAZOLIN SODIUM-DEXTROSE 2-4 GM/100ML-% IV SOLN
INTRAVENOUS | Status: AC
  Filled 2016-10-16: qty 100

## 2016-10-16 MED ORDER — HYDROXYZINE HCL 25 MG PO TABS
25.0000 mg | ORAL_TABLET | Freq: Every day | ORAL | Status: DC
  Administered 2016-10-16 – 2016-10-18 (×2): 25 mg via ORAL
  Filled 2016-10-16 (×3): qty 1

## 2016-10-16 MED ORDER — ENSURE ENLIVE PO LIQD
237.0000 mL | Freq: Three times a day (TID) | ORAL | Status: DC
  Administered 2016-10-17 – 2016-10-19 (×6): 237 mL via ORAL

## 2016-10-16 MED ORDER — LACTATED RINGERS IV SOLN
INTRAVENOUS | Status: DC
  Administered 2016-10-16: 10:00:00 via INTRAVENOUS

## 2016-10-16 MED ORDER — BUPROPION HCL ER (SR) 150 MG PO TB12
150.0000 mg | ORAL_TABLET | Freq: Two times a day (BID) | ORAL | Status: DC
  Administered 2016-10-17 – 2016-10-19 (×6): 150 mg via ORAL
  Filled 2016-10-16 (×6): qty 1

## 2016-10-16 MED ORDER — FENTANYL CITRATE (PF) 100 MCG/2ML IJ SOLN
25.0000 ug | INTRAMUSCULAR | Status: DC | PRN
  Administered 2016-10-16 (×4): 25 ug via INTRAVENOUS

## 2016-10-16 MED ORDER — VANCOMYCIN HCL IN DEXTROSE 1-5 GM/200ML-% IV SOLN
INTRAVENOUS | Status: AC
  Filled 2016-10-16: qty 200

## 2016-10-16 MED ORDER — ACETAMINOPHEN 500 MG PO TABS
1000.0000 mg | ORAL_TABLET | Freq: Three times a day (TID) | ORAL | Status: DC
  Administered 2016-10-16 – 2016-10-20 (×8): 1000 mg via ORAL
  Filled 2016-10-16 (×11): qty 2

## 2016-10-16 MED ORDER — BISACODYL 10 MG RE SUPP: 10.0000 mg | Freq: Every day | RECTAL | Status: DC | PRN

## 2016-10-16 MED ORDER — TRANEXAMIC ACID 1000 MG/10ML IV SOLN
1000.0000 mg | INTRAVENOUS | Status: AC
  Administered 2016-10-16: 1000 mg via INTRAVENOUS
  Filled 2016-10-16: qty 1100

## 2016-10-16 MED ORDER — HYDROMORPHONE HCL-NACL 0.5-0.9 MG/ML-% IV SOSY: 0.5000 mg | PREFILLED_SYRINGE | INTRAVENOUS | Status: DC | PRN

## 2016-10-16 MED ORDER — PHENYLEPHRINE HCL 10 MG/ML IJ SOLN
INTRAMUSCULAR | Status: DC | PRN
  Administered 2016-10-16 (×2): 80 ug via INTRAVENOUS

## 2016-10-16 MED ORDER — LIDOCAINE 2% (20 MG/ML) 5 ML SYRINGE
INTRAMUSCULAR | Status: AC
  Filled 2016-10-16: qty 5

## 2016-10-16 MED ORDER — BUPIVACAINE IN DEXTROSE 0.75-8.25 % IT SOLN
INTRATHECAL | Status: DC | PRN
  Administered 2016-10-16: 10 mg via INTRATHECAL

## 2016-10-16 MED ORDER — ALBUMIN HUMAN 5 % IV SOLN
INTRAVENOUS | Status: DC | PRN
Start: 2016-10-16 — End: 2016-10-16
  Administered 2016-10-16: 13:00:00 via INTRAVENOUS

## 2016-10-16 MED ORDER — SODIUM CHLORIDE 0.9 % IV SOLN
INTRAVENOUS | Status: DC
  Administered 2016-10-16 – 2016-10-20 (×7): via INTRAVENOUS

## 2016-10-16 MED ORDER — SODIUM CHLORIDE 0.9 % IR SOLN
Status: DC | PRN
  Administered 2016-10-16: 3000 mL

## 2016-10-16 MED ORDER — DEXAMETHASONE SODIUM PHOSPHATE 10 MG/ML IJ SOLN
10.0000 mg | Freq: Once | INTRAMUSCULAR | Status: AC
  Administered 2016-10-17: 10 mg via INTRAVENOUS
  Filled 2016-10-16: qty 1

## 2016-10-16 MED ORDER — MEPERIDINE HCL 50 MG/ML IJ SOLN: 6.2500 mg | INTRAMUSCULAR | Status: DC | PRN

## 2016-10-16 MED ORDER — PROPOFOL 500 MG/50ML IV EMUL
INTRAVENOUS | Status: DC | PRN
  Administered 2016-10-16: 10 ug/kg/min via INTRAVENOUS

## 2016-10-16 MED ORDER — CEFAZOLIN SODIUM-DEXTROSE 2-4 GM/100ML-% IV SOLN
2.0000 g | Freq: Once | INTRAVENOUS | Status: AC
  Administered 2016-10-16: 2 g via INTRAVENOUS

## 2016-10-16 MED ORDER — DEXAMETHASONE SODIUM PHOSPHATE 10 MG/ML IJ SOLN
10.0000 mg | Freq: Once | INTRAMUSCULAR | Status: AC
  Administered 2016-10-16: 10 mg via INTRAVENOUS

## 2016-10-16 MED ORDER — SODIUM CHLORIDE 0.9 % IJ SOLN
INTRAMUSCULAR | Status: AC
  Filled 2016-10-16: qty 50

## 2016-10-16 MED ORDER — PHENYLEPHRINE HCL 10 MG/ML IJ SOLN
INTRAMUSCULAR | Status: AC
  Filled 2016-10-16: qty 2

## 2016-10-16 MED ORDER — SODIUM CHLORIDE 0.9 % IV SOLN
1000.0000 mg | Freq: Once | INTRAVENOUS | Status: AC
  Administered 2016-10-16: 1000 mg via INTRAVENOUS
  Filled 2016-10-16: qty 10

## 2016-10-16 MED ORDER — BALSALAZIDE DISODIUM 750 MG PO CAPS
2250.0000 mg | ORAL_CAPSULE | Freq: Two times a day (BID) | ORAL | Status: DC
  Administered 2016-10-17 – 2016-10-19 (×3): 2250 mg via ORAL
  Filled 2016-10-16 (×13): qty 3

## 2016-10-16 MED ORDER — METOCLOPRAMIDE HCL 5 MG/ML IJ SOLN: 5.0000 mg | Freq: Three times a day (TID) | INTRAMUSCULAR | Status: DC | PRN

## 2016-10-16 MED ORDER — ALBUMIN HUMAN 5 % IV SOLN
INTRAVENOUS | Status: AC
  Filled 2016-10-16: qty 250

## 2016-10-16 MED ORDER — CEFAZOLIN SODIUM-DEXTROSE 2-3 GM-% IV SOLR: 2.0000 g | Freq: Once | INTRAVENOUS | Status: DC

## 2016-10-16 MED ORDER — DIPHENHYDRAMINE HCL 25 MG PO CAPS: 25.0000 mg | ORAL_CAPSULE | Freq: Four times a day (QID) | ORAL | Status: DC | PRN

## 2016-10-16 MED ORDER — ALUM & MAG HYDROXIDE-SIMETH 200-200-20 MG/5ML PO SUSP: 15.0000 mL | ORAL | Status: DC | PRN

## 2016-10-16 MED ORDER — PROPOFOL 10 MG/ML IV BOLUS
INTRAVENOUS | Status: AC
  Filled 2016-10-16: qty 40

## 2016-10-16 MED ORDER — FUROSEMIDE 40 MG PO TABS
40.0000 mg | ORAL_TABLET | Freq: Every day | ORAL | Status: DC
  Administered 2016-10-17: 40 mg via ORAL
  Filled 2016-10-16: qty 1

## 2016-10-16 MED ORDER — DOCUSATE SODIUM 100 MG PO CAPS
100.0000 mg | ORAL_CAPSULE | Freq: Two times a day (BID) | ORAL | Status: DC
  Administered 2016-10-16 – 2016-10-19 (×5): 100 mg via ORAL
  Filled 2016-10-16 (×6): qty 1

## 2016-10-16 MED ORDER — FENTANYL CITRATE (PF) 100 MCG/2ML IJ SOLN
INTRAMUSCULAR | Status: AC
  Filled 2016-10-16: qty 2

## 2016-10-16 MED ORDER — BUPIVACAINE-EPINEPHRINE (PF) 0.25% -1:200000 IJ SOLN
INTRAMUSCULAR | Status: AC
  Filled 2016-10-16: qty 30

## 2016-10-16 MED ORDER — CHLORHEXIDINE GLUCONATE 4 % EX LIQD: 60.0000 mL | Freq: Once | CUTANEOUS | Status: DC

## 2016-10-16 MED ORDER — VANCOMYCIN HCL IN DEXTROSE 1-5 GM/200ML-% IV SOLN
1000.0000 mg | Freq: Once | INTRAVENOUS | Status: AC
  Administered 2016-10-16: 1000 mg via INTRAVENOUS

## 2016-10-16 MED ORDER — ONDANSETRON HCL 4 MG/2ML IJ SOLN
INTRAMUSCULAR | Status: DC | PRN
  Administered 2016-10-16: 4 mg via INTRAVENOUS

## 2016-10-16 MED ORDER — ALBUTEROL SULFATE (2.5 MG/3ML) 0.083% IN NEBU: 2.5000 mg | INHALATION_SOLUTION | Freq: Four times a day (QID) | RESPIRATORY_TRACT | Status: DC | PRN

## 2016-10-16 MED ORDER — PHENOL 1.4 % MT LIQD: 1.0000 | OROMUCOSAL | Status: DC | PRN

## 2016-10-16 MED ORDER — MAGNESIUM CITRATE PO SOLN: 1.0000 | Freq: Once | ORAL | Status: DC | PRN

## 2016-10-16 MED ORDER — FERROUS SULFATE 325 (65 FE) MG PO TABS
325.0000 mg | ORAL_TABLET | Freq: Three times a day (TID) | ORAL | Status: DC
  Administered 2016-10-17 – 2016-10-19 (×6): 325 mg via ORAL
  Filled 2016-10-16 (×7): qty 1

## 2016-10-16 MED ORDER — DILTIAZEM HCL ER COATED BEADS 120 MG PO CP24
120.0000 mg | ORAL_CAPSULE | Freq: Every day | ORAL | Status: DC
  Administered 2016-10-16 – 2016-10-19 (×4): 120 mg via ORAL
  Filled 2016-10-16 (×5): qty 1

## 2016-10-16 MED ORDER — ONDANSETRON HCL 4 MG/2ML IJ SOLN
INTRAMUSCULAR | Status: AC
  Filled 2016-10-16: qty 2

## 2016-10-16 MED ORDER — HYDROXYZINE PAMOATE 25 MG PO CAPS
25.0000 mg | ORAL_CAPSULE | Freq: Every day | ORAL | Status: DC
  Filled 2016-10-16: qty 1

## 2016-10-16 MED ORDER — CELECOXIB 200 MG PO CAPS
200.0000 mg | ORAL_CAPSULE | Freq: Two times a day (BID) | ORAL | Status: DC
  Administered 2016-10-16 – 2016-10-17 (×2): 200 mg via ORAL
  Filled 2016-10-16 (×3): qty 1

## 2016-10-16 MED ORDER — POLYETHYLENE GLYCOL 3350 17 G PO PACK
17.0000 g | PACK | Freq: Two times a day (BID) | ORAL | Status: DC
  Administered 2016-10-16 – 2016-10-19 (×6): 17 g via ORAL
  Filled 2016-10-16 (×6): qty 1

## 2016-10-16 MED ORDER — METHOCARBAMOL 1000 MG/10ML IJ SOLN
500.0000 mg | Freq: Four times a day (QID) | INTRAMUSCULAR | Status: DC | PRN
  Filled 2016-10-16: qty 5

## 2016-10-16 MED ORDER — VANCOMYCIN HCL 500 MG IV SOLR
500.0000 mg | Freq: Two times a day (BID) | INTRAVENOUS | Status: DC
  Administered 2016-10-17 – 2016-10-18 (×3): 500 mg via INTRAVENOUS
  Filled 2016-10-16 (×4): qty 500

## 2016-10-16 MED ORDER — PROPOFOL 10 MG/ML IV BOLUS
INTRAVENOUS | Status: DC | PRN
  Administered 2016-10-16 (×3): 20 mg via INTRAVENOUS

## 2016-10-16 MED ORDER — DEXAMETHASONE SODIUM PHOSPHATE 10 MG/ML IJ SOLN
INTRAMUSCULAR | Status: AC
  Filled 2016-10-16: qty 1

## 2016-10-16 MED ORDER — KETOROLAC TROMETHAMINE 30 MG/ML IJ SOLN
INTRAMUSCULAR | Status: AC
  Filled 2016-10-16: qty 1

## 2016-10-16 MED ORDER — PHENYLEPHRINE HCL 10 MG/ML IJ SOLN
INTRAVENOUS | Status: DC | PRN
  Administered 2016-10-16: 10 ug/min via INTRAVENOUS

## 2016-10-16 MED ORDER — MENTHOL 3 MG MT LOZG: 1.0000 | LOZENGE | OROMUCOSAL | Status: DC | PRN

## 2016-10-16 MED ORDER — ALBUTEROL SULFATE (2.5 MG/3ML) 0.083% IN NEBU
INHALATION_SOLUTION | RESPIRATORY_TRACT | Status: AC
  Administered 2016-10-16: 2.5 mg
  Filled 2016-10-16: qty 3

## 2016-10-16 MED ORDER — ONDANSETRON HCL 4 MG/2ML IJ SOLN: 4.0000 mg | Freq: Four times a day (QID) | INTRAMUSCULAR | Status: DC | PRN

## 2016-10-16 MED ORDER — OXYCODONE HCL 5 MG PO TABS
5.0000 mg | ORAL_TABLET | ORAL | Status: DC
  Administered 2016-10-16 (×2): 15 mg via ORAL
  Administered 2016-10-17 (×3): 10 mg via ORAL
  Filled 2016-10-16: qty 2
  Filled 2016-10-16: qty 1
  Filled 2016-10-16: qty 3
  Filled 2016-10-16: qty 2
  Filled 2016-10-16: qty 3
  Filled 2016-10-16: qty 2

## 2016-10-16 MED ORDER — VANCOMYCIN HCL 1000 MG IV SOLR: 1000.0000 mg | Freq: Two times a day (BID) | INTRAVENOUS | Status: DC

## 2016-10-16 MED ORDER — LATANOPROST 0.005 % OP SOLN
1.0000 [drp] | Freq: Every day | OPHTHALMIC | Status: DC
  Administered 2016-10-16 – 2016-10-18 (×2): 1 [drp] via OPHTHALMIC
  Filled 2016-10-16: qty 2.5

## 2016-10-16 MED ORDER — UMECLIDINIUM-VILANTEROL 62.5-25 MCG/INH IN AEPB
1.0000 | INHALATION_SPRAY | Freq: Every day | RESPIRATORY_TRACT | Status: DC
  Administered 2016-10-17 – 2016-10-18 (×2): 1 via RESPIRATORY_TRACT
  Filled 2016-10-16: qty 14

## 2016-10-16 MED ORDER — PHENYLEPHRINE 40 MCG/ML (10ML) SYRINGE FOR IV PUSH (FOR BLOOD PRESSURE SUPPORT)
PREFILLED_SYRINGE | INTRAVENOUS | Status: AC
  Filled 2016-10-16: qty 10

## 2016-10-16 SURGICAL SUPPLY — 68 items
ADH SKN CLS APL DERMABOND .7 (GAUZE/BANDAGES/DRESSINGS)
BAG SPEC THK2 15X12 ZIP CLS (MISCELLANEOUS) ×1
BAG ZIPLOCK 12X15 (MISCELLANEOUS) ×3 IMPLANT
BANDAGE ACE 4X5 VEL STRL LF (GAUZE/BANDAGES/DRESSINGS) ×2 IMPLANT
BANDAGE ESMARK 6X9 LF (GAUZE/BANDAGES/DRESSINGS) ×1 IMPLANT
BLADE SAW SGTL 11.0X1.19X90.0M (BLADE) IMPLANT
BLADE SAW SGTL 13.0X1.19X90.0M (BLADE) ×3 IMPLANT
BLADE SAW SGTL 81X20 HD (BLADE) ×3 IMPLANT
BNDG CMPR 9X6 STRL LF SNTH (GAUZE/BANDAGES/DRESSINGS) ×1
BNDG ESMARK 6X9 LF (GAUZE/BANDAGES/DRESSINGS) ×3
BOWL SMART MIX CTS (DISPOSABLE) ×6 IMPLANT
CEMENT HV SMART SET (Cement) ×15 IMPLANT
COVER SURGICAL LIGHT HANDLE (MISCELLANEOUS) ×3 IMPLANT
CUFF TOURN SGL QUICK 34 (TOURNIQUET CUFF) ×3
CUFF TRNQT CYL 34X4X40X1 (TOURNIQUET CUFF) ×1 IMPLANT
DERMABOND ADVANCED (GAUZE/BANDAGES/DRESSINGS)
DERMABOND ADVANCED .7 DNX12 (GAUZE/BANDAGES/DRESSINGS) ×1 IMPLANT
DRAPE EXTREMITY T 121X128X90 (DRAPE) ×3 IMPLANT
DRAPE POUCH INSTRU U-SHP 10X18 (DRAPES) ×3 IMPLANT
DRAPE U-SHAPE 47X51 STRL (DRAPES) ×3 IMPLANT
DRESSING AQUACEL AG SP 3.5X10 (GAUZE/BANDAGES/DRESSINGS) ×1 IMPLANT
DRSG AQUACEL AG SP 3.5X10 (GAUZE/BANDAGES/DRESSINGS) ×3
DRSG PAD ABDOMINAL 8X10 ST (GAUZE/BANDAGES/DRESSINGS) ×4 IMPLANT
DURAPREP 26ML APPLICATOR (WOUND CARE) ×6 IMPLANT
ELECT REM PT RETURN 15FT ADLT (MISCELLANEOUS) ×3 IMPLANT
FACESHIELD WRAPAROUND (MASK) ×15 IMPLANT
FACESHIELD WRAPAROUND OR TEAM (MASK) ×5 IMPLANT
GAUZE SPONGE 4X4 12PLY STRL (GAUZE/BANDAGES/DRESSINGS) ×2 IMPLANT
GAUZE XEROFORM 1X8 LF (GAUZE/BANDAGES/DRESSINGS) ×4 IMPLANT
GLOVE BIOGEL M 7.0 STRL (GLOVE) IMPLANT
GLOVE BIOGEL PI IND STRL 7.5 (GLOVE) ×1 IMPLANT
GLOVE BIOGEL PI IND STRL 8.5 (GLOVE) ×1 IMPLANT
GLOVE BIOGEL PI INDICATOR 7.5 (GLOVE) ×10
GLOVE BIOGEL PI INDICATOR 8.5 (GLOVE) ×2
GLOVE ECLIPSE 8.0 STRL XLNG CF (GLOVE) ×6 IMPLANT
GLOVE ORTHO TXT STRL SZ7.5 (GLOVE) ×6 IMPLANT
GOWN STRL REUS W/TWL LRG LVL3 (GOWN DISPOSABLE) ×3 IMPLANT
GOWN STRL REUS W/TWL XL LVL3 (GOWN DISPOSABLE) ×7 IMPLANT
HANDPIECE INTERPULSE COAX TIP (DISPOSABLE) ×3
MANIFOLD NEPTUNE II (INSTRUMENTS) ×3 IMPLANT
MARKER SKIN DUAL TIP RULER LAB (MISCELLANEOUS) ×3 IMPLANT
NDL SAFETY ECLIPSE 18X1.5 (NEEDLE) ×1 IMPLANT
NEEDLE HYPO 18GX1.5 SHARP (NEEDLE)
NS IRRIG 1000ML POUR BTL (IV SOLUTION) ×3 IMPLANT
PADDING CAST COTTON 6X4 STRL (CAST SUPPLIES) ×4 IMPLANT
POSITIONER SURGICAL ARM (MISCELLANEOUS) ×3 IMPLANT
SET HNDPC FAN SPRY TIP SCT (DISPOSABLE) ×1 IMPLANT
SET PAD KNEE POSITIONER (MISCELLANEOUS) ×3 IMPLANT
SPACER KASM MOLD44APX67ML KNEE (Spacer) ×2 IMPLANT
SPACER TIBIAL SM39*58 KNEE (Spacer) ×2 IMPLANT
SPONGE LAP 18X18 X RAY DECT (DISPOSABLE) ×5 IMPLANT
STAPLER VISISTAT 35W (STAPLE) ×2 IMPLANT
SUCTION FRAZIER HANDLE 12FR (TUBING) ×2
SUCTION TUBE FRAZIER 12FR DISP (TUBING) ×1 IMPLANT
SUT MNCRL AB 4-0 PS2 18 (SUTURE) ×3 IMPLANT
SUT PDS AB 1 CT1 27 (SUTURE) ×6 IMPLANT
SUT VIC AB 1 CT1 36 (SUTURE) ×6 IMPLANT
SUT VIC AB 2-0 CT1 27 (SUTURE) ×9
SUT VIC AB 2-0 CT1 TAPERPNT 27 (SUTURE) ×3 IMPLANT
SUT VLOC 180 0 24IN GS25 (SUTURE) IMPLANT
SYR 50ML LL SCALE MARK (SYRINGE) ×1 IMPLANT
TOWEL OR 17X26 10 PK STRL BLUE (TOWEL DISPOSABLE) ×6 IMPLANT
TOWEL OR NON WOVEN STRL DISP B (DISPOSABLE) ×3 IMPLANT
TOWER CARTRIDGE SMART MIX (DISPOSABLE) ×1 IMPLANT
TRAY FOLEY W/METER SILVER 16FR (SET/KITS/TRAYS/PACK) ×3 IMPLANT
WATER STERILE IRR 1500ML POUR (IV SOLUTION) ×5 IMPLANT
WRAP KNEE MAXI GEL POST OP (GAUZE/BANDAGES/DRESSINGS) ×3 IMPLANT
YANKAUER SUCT BULB TIP 10FT TU (MISCELLANEOUS) ×3 IMPLANT

## 2016-10-16 NOTE — Anesthesia Postprocedure Evaluation (Signed)
Anesthesia Post Note  [name]  Procedure(s) Performed: Procedure(s) (LRB): RESECTION LEFT TOTAL KNEE ARTHROPLASTY AND PLACEMENT OF ANTIBIOTIC SPACERS  (Left)     Patient location during evaluation: PACU Anesthesia Type: Spinal Level of consciousness: awake and alert Pain management: pain level controlled Vital Signs Assessment: post-procedure vital signs reviewed and stable Respiratory status: spontaneous breathing, nonlabored ventilation, respiratory function stable and patient connected to nasal cannula oxygen Cardiovascular status: blood pressure returned to baseline and stable Postop Assessment: no signs of nausea or vomiting Anesthetic complications: no    Last Vitals:  Vitals:   10/16/16 0955  BP: 121/80  Pulse: 98  Resp: 18  Temp: 36.8 C    Last Pain:  Vitals:   10/16/16 0955  TempSrc: Oral                 Raphaella Larkin

## 2016-10-16 NOTE — Progress Notes (Signed)
Pharmacy Antibiotic Note  [name] is a 82 y.o. female hx recurrent left TKA presented to [facility]  10/16/2016 for resection of left TKA and placement of articulating antibiotic spacer.  To start vancomycin post-op for knee infection.  - pre- surgical scr was 0.69 on 8/2 (crcl~50) - pt received vancomycin 1000 mg IV pre-op at 1322   Plan: - vancomycin 500 mg IV q12h - f/u cultures and renal function _________________________   Height: 5\' 2"  (157.5 cm) Weight: 144 lb 1.6 oz (65.4 kg) IBW/kg (Calculated) : 50.1  Temp (24hrs), Avg:98.2 F (36.8 C), Min:97.6 F (36.4 C), Max:98.4 F (36.9 C)   Recent Labs Lab 10/12/16 1116  CREATININE 0.69    Estimated Creatinine Clearance: 50.6 mL/min (by C-G formula based on SCr of 0.69 mg/dL).    No Known Allergies   Thank you for allowing pharmacy to be a part of this patient's care.  Lynelle Doctor 10/16/2016 5:08 PM

## 2016-10-16 NOTE — Transfer of Care (Signed)
Immediate Anesthesia Transfer of Care Note  [name]  Procedure(s) Performed: Procedure(s) with comments: RESECTION LEFT TOTAL KNEE ARTHROPLASTY AND PLACEMENT OF ANTIBIOTIC SPACERS  (Left) - 120 mins  Patient Location: PACU  Anesthesia Type:MAC and Spinal  Level of Consciousness: awake  Airway & Oxygen Therapy: Patient Spontanous Breathing and Patient connected to face mask oxygen  Post-op Assessment: Report given to RN and Post -op Vital signs reviewed and stable  Post vital signs: Reviewed and stable  Last Vitals:  Vitals:   10/16/16 0955  BP: 121/80  Pulse: 98  Resp: 18  Temp: 36.8 C    Last Pain:  Vitals:   10/16/16 0955  TempSrc: Oral         Complications: No apparent anesthesia complications

## 2016-10-16 NOTE — Interval H&P Note (Signed)
History and Physical Interval Note:  10/16/2016 11:12 AM  [name]  has presented today for surgery, with the diagnosis of Recurrent infection left total knee  The various methods of treatment have been discussed with the patient and family. After consideration of risks, benefits and other options for treatment, the patient has consented to  Procedure(s) with comments: RESECTION LEFT TOTAL KNEE ARTHROPLASTY AND PLACEMENT OF ANTIBIOTIC SPACERS  (Left) - 120 mins as a surgical intervention .  The patient's history has been reviewed, patient examined, no change in status, stable for surgery.  I have reviewed the patient's chart and labs.  Questions were answered to the patient's satisfaction.     Mauri Pole

## 2016-10-16 NOTE — Anesthesia Procedure Notes (Signed)
Spinal  Patient location during procedure: OR Start time: 10/16/2016 12:30 PM End time: 10/16/2016 12:38 PM Staffing Anesthesiologist: Rayel Santizo Preanesthetic Checklist Completed: patient identified, site marked, surgical consent, pre-op evaluation, timeout performed, IV checked, risks and benefits discussed and monitors and equipment checked Spinal Block Patient position: left lateral decubitus Prep: DuraPrep Patient monitoring: heart rate, cardiac monitor, continuous pulse ox and blood pressure Approach: midline Location: L2-3 Injection technique: single-shot Needle Needle type: Sprotte  Needle gauge: 22 G Needle length: 9 cm Assessment Sensory level: T4

## 2016-10-16 NOTE — Brief Op Note (Signed)
10/16/2016  12:04 PM  PATIENT:  [name]  82 y.o. female  PRE-OPERATIVE DIAGNOSIS:  Recurrent infection left total knee replacement  POST-OPERATIVE DIAGNOSIS:  Recurrent infection left total knee replacement  PROCEDURE:  Procedure(s) with comments: RESECTION LEFT TOTAL KNEE ARTHROPLASTY AND PLACEMENT OF CEMENTED ARTICULATING ANTIBIOTIC SPACERS    SURGEON:  Surgeon(s) and Role:    Paralee Cancel, MD - Primary  PHYSICIAN ASSISTANT: Danae Orleans, PA-C  ANESTHESIA:   spinal  EBL:  <200 cc  BLOOD ADMINISTERED:none  DRAINS: (1 medium) Hemovact drain(s) in the left knee with  Suction Open   LOCAL MEDICATIONS USED:  NONE  SPECIMEN:  Source of Specimen:  left knee synovial fluid  DISPOSITION OF SPECIMEN:  PATHOLOGY  COUNTS:  YES  TOURNIQUET:   75 min at 29mmHg  DICTATION: .Other Dictation: Dictation Number [id number]  PLAN OF CARE: Admit to inpatient   PATIENT DISPOSITION:  PACU - hemodynamically stable.   Delay start of Pharmacological VTE agent (>24hrs) due to surgical blood loss or risk of bleeding: no

## 2016-10-16 NOTE — Anesthesia Procedure Notes (Signed)
Procedure Name: MAC Date/Time: 10/16/2016 12:33 PM Performed by: Dione Booze Pre-anesthesia Checklist: Patient identified, Emergency Drugs available, Suction available and Patient being monitored Patient Re-evaluated:Patient Re-evaluated prior to induction Oxygen Delivery Method: Simple face mask Placement Confirmation: positive ETCO2

## 2016-10-16 NOTE — Anesthesia Preprocedure Evaluation (Addendum)
Anesthesia Evaluation  Patient identified by MRN, date of birth, ID band Patient awake    Reviewed: Allergy & Precautions, NPO status , Patient's Chart, lab work & pertinent test results  Airway Mallampati: I  TM Distance: >3 FB Neck ROM: Full    Dental   Pulmonary shortness of breath and with exertion, COPD,  oxygen dependent, Current Smoker, former smoker,    Pulmonary exam normal        Cardiovascular hypertension, Pt. on medications Normal cardiovascular exam+ dysrhythmias Atrial Fibrillation      Neuro/Psych Anxiety Depression    GI/Hepatic GERD  ,  Endo/Other  Hypothyroidism   Renal/GU      Musculoskeletal  (+) Arthritis , Osteoarthritis,    Abdominal   Peds  Hematology   Anesthesia Other Findings - Normal LV size with mild LV hypertrophy. EF 60-65%. Normal RV size and systolic function. No significant valvular abnormalities.  Reproductive/Obstetrics                            Anesthesia Physical  Anesthesia Plan  ASA: IV  Anesthesia Plan: Spinal   Post-op Pain Management: GA combined w/ Regional for post-op pain   Induction:   PONV Risk Score and Plan: 2 and Ondansetron, Dexamethasone, Midazolam and Treatment may vary due to age or medical condition  Airway Management Planned: Natural Airway and Nasal Cannula  Additional Equipment:   Intra-op Plan:   Post-operative Plan:   Informed Consent: I have reviewed the patients History and Physical, chart, labs and discussed the procedure including the risks, benefits and alternatives for the proposed anesthesia with the patient or authorized representative who has indicated his/her understanding and acceptance.   Dental advisory given  Plan Discussed with: CRNA and Surgeon  Anesthesia Plan Comments:       Anesthesia Quick Evaluation                                   Anesthesia Evaluation  Patient identified by MRN,  date of birth, ID band Patient awake    Reviewed: Allergy & Precautions, NPO status , Patient's Chart, lab work & pertinent test results  Airway Mallampati: I  TM Distance: >3 FB Neck ROM: Full    Dental   Pulmonary Current Smoker,    Pulmonary exam normal        Cardiovascular hypertension, Pt. on medications Normal cardiovascular exam     Neuro/Psych Anxiety Depression    GI/Hepatic   Endo/Other    Renal/GU      Musculoskeletal   Abdominal   Peds  Hematology   Anesthesia Other Findings   Reproductive/Obstetrics                            Anesthesia Physical Anesthesia Plan  ASA: III  Anesthesia Plan: General   Post-op Pain Management: GA combined w/ [facility] for post-op pain   Induction: Intravenous  Airway Management Planned: Natural Airway  Additional Equipment:   Intra-op Plan:   Post-operative Plan:   Informed Consent: I have reviewed the patients History and Physical, chart, labs and discussed the procedure including the risks, benefits and alternatives for the proposed anesthesia with the patient or authorized representative who has indicated his/her understanding and acceptance.     Plan Discussed with: CRNA and Surgeon  Anesthesia Plan Comments:  Anesthesia Quick Evaluation  

## 2016-10-17 DIAGNOSIS — A4901 Methicillin susceptible Staphylococcus aureus infection, unspecified site: Secondary | ICD-10-CM

## 2016-10-17 DIAGNOSIS — T8450XA Infection and inflammatory reaction due to unspecified internal joint prosthesis, initial encounter: Secondary | ICD-10-CM

## 2016-10-17 DIAGNOSIS — M069 Rheumatoid arthritis, unspecified: Secondary | ICD-10-CM

## 2016-10-17 LAB — BASIC METABOLIC PANEL
ANION GAP: 10 (ref 5–15)
BUN: 32 mg/dL — ABNORMAL HIGH (ref 6–20)
CALCIUM: 8.5 mg/dL — AB (ref 8.9–10.3)
CHLORIDE: 107 mmol/L (ref 101–111)
CO2: 23 mmol/L (ref 22–32)
Creatinine, Ser: 0.99 mg/dL (ref 0.44–1.00)
GFR calc non Af Amer: 53 mL/min — ABNORMAL LOW (ref >60)
GLUCOSE: 139 mg/dL — AB (ref 65–99)
Potassium: 5.6 mmol/L — ABNORMAL HIGH (ref 3.5–5.1)
Sodium: 140 mmol/L (ref 135–145)

## 2016-10-17 LAB — CBC
HEMATOCRIT: 31.8 % — AB (ref 36.0–46.0)
HEMOGLOBIN: 9.6 g/dL — AB (ref 12.0–15.0)
MCH: 24.6 pg — ABNORMAL LOW (ref 26.0–34.0)
MCHC: 30.2 g/dL (ref 30.0–36.0)
MCV: 81.3 fL (ref 78.0–100.0)
Platelets: 125 10*3/uL — ABNORMAL LOW (ref 150–400)
RBC: 3.91 MIL/uL (ref 3.87–5.11)
RDW: 19.3 % — AB (ref 11.5–15.5)
WBC: 12.9 10*3/uL — AB (ref 4.0–10.5)

## 2016-10-17 MED ORDER — CHLORHEXIDINE GLUCONATE CLOTH 2 % EX PADS
6.0000 | MEDICATED_PAD | Freq: Every day | CUTANEOUS | Status: DC
  Administered 2016-10-17 – 2016-10-20 (×4): 6 via TOPICAL

## 2016-10-17 MED ORDER — MUPIROCIN 2 % EX OINT
1.0000 "application " | TOPICAL_OINTMENT | Freq: Two times a day (BID) | CUTANEOUS | Status: DC
  Administered 2016-10-17 – 2016-10-19 (×6): 1 via NASAL
  Filled 2016-10-17: qty 22

## 2016-10-17 NOTE — Care Management Note (Signed)
Case Management Note  Patient Details  Name: [name] MRN: [id number] Date of Birth: Jun 22, 1934  Subjective/Objective:       resection of left total knee and placement of cemented articulation antibiotic spacers.  Hypotension s/p or           Action/Plan: Date:  October 17, 2016 Chart reviewed for concurrent status and case management needs. Will continue to follow patient progress. Discharge Planning: following for needs Expected discharge date: [id number] Velva Harman, BSN, [place], [place] Expected Discharge Date:                  Expected Discharge Plan:  Home/Self Care  In-House Referral:     Discharge planning Services  CM Consult  Post Acute Care Choice:    Choice offered to:     DME Arranged:    DME Agency:     HH Arranged:    HH Agency:     Status of Service:  In process, will continue to follow  If discussed at Long Length of Stay Meetings, dates discussed:    Additional Comments:  Leeroy Cha, RN 10/17/2016, 8:37 AM

## 2016-10-17 NOTE — Progress Notes (Signed)
Physical Therapy Evaluation Patient Details Name: [name] MRN: [id number] DOB: [birth year] Today's Date: 10/17/2016   History of Present Illness  Pt is a 82 y.o. female s/p resection of L TKA and placement of an antiobiotic spacer   Clinical Impression  Pt is s/p surgery listed above resulting in functional limitations due to the deficits listed below. Pt will benefit from skilled PT to increase their independence and safety with mobility to allow discharge. Pt was confused during the session and was not able to recall the date or her present location. Pt tolerated session with little pain increase and was able to sit at EOB. Pt might benefit from a platform walker when attempting transfer/gait because of RA.     Follow Up Recommendations SNF    Equipment Recommendations  None recommended by PT    Recommendations for Other Services       Precautions / Restrictions Precautions Precautions: Fall;Knee Restrictions Weight Bearing Restrictions: Yes Other Position/Activity Restrictions: PWB, ROM as tolerated       Mobility  Bed Mobility Overal bed mobility: Needs Assistance Bed Mobility: Supine to Sit;Rolling Rolling: Max assist (Pt rolled to remove bed pan)   Supine to sit: +2 for physical assistance;+2 for safety/equipment;Max assist     General bed mobility comments: Pt requires +2 max assist for simultaneous LE and trunk management while transitioning to sit at EOB   Transfers                 General transfer comment: Transfer was not attempted for safety   Ambulation/Gait             General Gait Details: Ambulation not attempted at this time   Stairs            Wheelchair Mobility    Modified Rankin (Stroke Patients Only)       Balance Overall balance assessment: Needs assistance   Sitting balance-Leahy Scale: Poor Sitting balance - Comments: Pt requires support on trunk to steady at EOB                                      Pertinent Vitals/Pain Pain Assessment: Faces Faces Pain Scale: Hurts little more Pain Location: Pt originally denies pain but upon sitting at EOB reports slight pain in L knee  Pain Descriptors / Indicators: Aching;Sore;Discomfort Pain Intervention(s): Limited activity within patient's tolerance;Repositioned;Ice applied;Monitored during session;Premedicated before session    Home Living Family/patient expects to be discharged to:: Skilled nursing facility Living Arrangements: Other (Comment) (Pt at [facility] )                    Prior Function Level of Independence: Needs assistance         Comments: Pt is confused and is a bad historian, stated she requires full assistance for ADLs, Pt reports mainly staying in bed      Hand Dominance        Extremity/Trunk Assessment   Upper Extremity Assessment Upper Extremity Assessment: Generalized weakness (Pt with history of RA and presents with hand deformity,)    Lower Extremity Assessment Lower Extremity Assessment: LLE deficits/detail;Generalized weakness LLE Deficits / Details: Pt requires max assist to move L LE s/p spacer, Pt knee flexion estimated to be around 50 degrees at EOB     Cervical / Trunk Assessment Cervical / Trunk Assessment: Kyphotic  Communication   Communication: HOH  Cognition  Arousal/Alertness: Awake/alert   Overall Cognitive Status: No family/caregiver present to determine baseline cognitive functioning                                 General Comments: Pt was confused and unable to state where she is and the current day, month, and year. Pt was able to state the president but kept thinking she was in [place].       General Comments      Exercises     Assessment/Plan    PT Assessment Patient needs continued PT services  PT Problem List Decreased strength;Decreased mobility;Decreased safety awareness;Decreased range of motion;Decreased coordination;Decreased activity  tolerance;Decreased balance       PT Treatment Interventions Therapeutic activities;DME instruction;Gait training;Therapeutic exercise;Balance training;Functional mobility training;Patient/family education    PT Goals (Current goals can be found in the Care Plan section)  Acute Rehab PT Goals Patient Stated Goal: None stated at this time  PT Goal Formulation: With patient Time For Goal Achievement: 10/31/16 Potential to Achieve Goals: Fair    Frequency Min 5X/week   Barriers to discharge        Co-evaluation               AM-PAC PT "6 Clicks" Daily Activity  Outcome Measure Difficulty turning over in bed (including adjusting bedclothes, sheets and blankets)?: Total Difficulty moving from lying on back to sitting on the side of the bed? : Total Difficulty sitting down on and standing up from a chair with arms (e.g., wheelchair, bedside commode, etc,.)?: Total Help needed moving to and from a bed to chair (including a wheelchair)?: Total Help needed walking in hospital room?: Total Help needed climbing 3-5 steps with a railing? : Total 6 Click Score: 6    End of Session   Activity Tolerance: Patient tolerated treatment well Patient left: in bed;with call bell/phone within reach;with bed alarm set Nurse Communication: Mobility status PT Visit Diagnosis: Difficulty in walking, not elsewhere classified (R26.2);Muscle weakness (generalized) (M62.81)    Time: 2376-2831 PT Time Calculation (min) (ACUTE ONLY): 35 min   Charges:   PT Evaluation $PT Eval Low Complexity: 1 Low PT Treatments $Therapeutic Activity: 8-22 mins   PT G Codes:        Olegario Shearer, SPT   Reino Bellis 10/17/2016, 1:27 PM

## 2016-10-17 NOTE — Progress Notes (Signed)
OT Cancellation Note  Patient Details Name: [name] MRN: [id number] DOB: Jan 11, 1935   Cancelled Treatment:    Reason Eval/Treat Not Completed: Other (comment).  Noted pt needed max +2 assistance to sit EOB and did not transfer with PT today.  Will check back tomorrow.  Sayre Witherington 10/17/2016, 2:27 PM  Lesle Chris, OTR/L [phone] 10/17/2016

## 2016-10-17 NOTE — Progress Notes (Signed)
Patient did not tolerate well while tangling at bedside. Her heart rate increased to 130's. Heart rate trend down to 110's after laying back in bed.

## 2016-10-17 NOTE — Op Note (Signed)
NAME:  [name], [name]                    ACCOUNT NO.:  MEDICAL RECORD NO.:  [id number]  LOCATION:                                 FACILITY:  PHYSICIAN:  Pietro Cassis. Alvan Dame, M.D.  DATE OF BIRTH:  1935-01-16  DATE OF PROCEDURE:  10/16/2016 DATE OF DISCHARGE:                              OPERATIVE REPORT   PREOPERATIVE DIAGNOSIS:  Recurrent infection, left total knee replacement.  POSTOPERATIVE DIAGNOSIS:  Recurrent infection, left total knee replacement.  PROCEDURE:  Resection of an infected left total knee, placement of an articulated and cemented antibiotic spacer.  FINDINGS:  Please see the body of the operative note for details of the findings.  SURGEON:  Pietro Cassis. Alvan Dame, M.D.  ASSISTANT:  Danae Orleans, PA-C.  Note that [name] was present for the entirety of the case from preoperative position, perioperative management of the operative extremity, general facilitation of the case and primary wound closure.  ANESTHESIA:  Spinal.  SPECIMENS:  Joint fluid from the knee taken.  COMPLICATIONS:  None.  DRAINS:  One medium Hemovac.  TOURNIQUET TIME:  75 minutes at 250 mmHg.  BLOOD LOSS:  200 mL or less at the end of the case when tourniquet was let down.  INDICATIONS FOR PROCEDURE:  [name] is a 82 year old pleasant female with longstanding history of rheumatoid arthritis.  She underwent an index total knee arthroplasty about 2 years ago.  This was complicated by postoperative lower extremity edematous changes and cellulitic changes and ultimately an infection of the left knee.  She underwent a two-stage attempted salvage in her joint.  She has been most recently noted to have persistent drainage following the reimplantation.  Her postoperative course has always been complicated by significant postoperative edematous changes, flow changes, cellulitis in her lower extremity.  After having the lengthy discussion of [name], I felt that it was important for  me to remove this knee and placement of antibiotic spacer. I have been up from with her and state that I do not know if I will be able to put another knee back in her after this procedure.  Risks of recurrence, risk of need for future surgeries, DVT, component failure, need for other surgery were all discussed and reviewed at length. Consent was obtained for management of the infection.  PROCEDURE IN DETAIL:  The patient was brought to the operative theater. Once adequate anesthesia, preoperative antibiotics, which were held until our capsulotomy was made, this included vancomycin and Ancef.  She was given tranexamic acid.  The left lower extremity was then prepped and draped in sterile fashion. Time-out was performed identifying the patient, planned procedure and extremity.  Leg was exsanguinated, tourniquet elevated to 250 mmHg. Skin incision was made excising an area of distal drainage.  I created soft tissue flaps carefully to preserve the integrity of the tissue for later repair.  An arthrotomy was then made encountering an abundant amount of purulent joint fluid.  Cultures were taken at this time for Gram stain and culture, aerobic, anaerobic.  Following the arthrotomy of the knee, I performed synovectomies, medial, suprapatellar and lateral.  I debrided it around the patella at this point.  At this point, we removed the femoral and the tibial components.  The cement was removed from the distal femur and proximal tibia including cement restrictor in the distal femur.  I irrigated the canal with canal brush irrigator.  Cement was mixed for a DePuy KASM articulating spacer molds.  I used 1 g of vancomycin and 1.2 of tobramycin per batch of cement.  I used two batches of cement for the femur, two for the tibia. The femoral mold was held in place until the cement fully cured.  Tibial tray was made on the back table and once it had cured, it was peeled from the tray and a final  batch of cement with 1 g of vancomycin was prepared.  The knee was then held in extension until the final cement on the tibia cured.  The knee was irrigated with a total of about 5 L of normal saline solution with pulse lavage.  The tourniquet was let down.  We placed a medium Hemovac drain deep.  The extensor mechanism was reapproximated using #1 PDS sutures in interrupted figure-of-eight fashion.  The remainder of the wound was closed with 2-0 Vicryl and staples on the skin.  The skin was cleaned, dried and dressed sterilely using Xeroform and a bulky dressing.  She was then brought to the recovery room tolerating the procedure well.  Findings were reviewed with her husband.  Again, we have a Hemovac drain in place.  We will keep that in place until the drainage stops within 1 or 2 days.  We will have Infectious Disease consult to help the management of antibiotics at this point.  I will have her be partial weightbearing with range of motion permitted as tolerable.     Pietro Cassis Alvan Dame, M.D.     MDO/MEDQ  D:  10/16/2016  T:  10/16/2016  Job:  [id number]

## 2016-10-17 NOTE — Consult Note (Signed)
[facility] for Infectious Disease  Total days of antibiotics 2        Day 2 vanco               Reason for Consult: recurrent pji    Referring Physician: matt olin  Active Problems:   Acquired absence of knee joint following explantation of joint prosthesis with presence of antibiotic-impregnated cement spacer  HPI: [name] is a 82 y.o. female with RA, PVD, hypothyroidsim, afib on coumadin who underwent left TKA on 4/16 that has been complicated with cellulitis requiring 10/13/14 s/p I xD and poly-exchange, and treated ceftriaxone and vancomycin then placed on oral cefuroxime/doxy. On 03/01/15- resection with abtx spacer then placed on IV ertapenem plus doxycyline then placed on oral chronic suppression with doxy plus cefuroxime. Still having drainage from her left knee prosthesis. Dr Alvan Dame admitted on 8/6 for resection of infected total knee, placement of articulated & cemented antibiotic spacer. She was placed on vancomycin, post operative. She remains afebrile.   Past Medical History:  Diagnosis Date  . Anemia   . Anxiety    sometimes   . Arthritis    rheumatoid arthritis   . Bruise    right lower extremity with opsite on  . Calcified cerebral meningioma ([place])   . Chronic back pain    "my entire spine"  . Chronic bronchitis ([place]) 10/31/2011   "prone to it; I have sinus/bronchitis problem probably q yr"  . Complication of anesthesia 2009   htn with block- for hand surgery , pt thinks anesthesia causing some long term confusion  . Depression   . Diarrhea 01/27/2015  . Dizziness   . Dyspnea    for last week per patient non prod cough   . Dysrhythmia    pt states bring off coumadin for paroxysmal atrial fibrillation for last month  . Edema   . Encounter for long-term (current) use of other medications   . Falls   . GERD (gastroesophageal reflux disease)   . H/O clavicle fracture    left  . Hard of hearing   . History of blood transfusion   . Hypertension    pcp    dr reed   [facility]  . Hypothyroidism   . Hypothyroidism   . Multiple skin tears 10/31/2011   "get them very easily; my skin is thin and I bruise easily"  . Other and unspecified hyperlipidemia   . Peripheral vascular disease ([place])   . Pneumonia 2008  . Rheumatoid arthritis(714.0)    degenerative disc disease , ra  . Senile osteoporosis   . Spinal stenosis, unspecified region other than cervical   . Superficial thrombophlebitis 04/20/2015  . Swelling of right upper extremity 11/26/2014  . Ulcerative colitis ([place])   . Unspecified glaucoma(365.9)     Allergies: No Known Allergies  MEDICATIONS: . acetaminophen  1,000 mg Oral Q8H  . aspirin  81 mg Oral BID  . balsalazide  2,250 mg Oral Q12H  . buPROPion  150 mg Oral BID WC  . celecoxib  200 mg Oral Q12H  . Chlorhexidine Gluconate Cloth  6 each Topical Q0600  . diazepam  5 mg Oral Q12H  . diltiazem  120 mg Oral Daily  . docusate sodium  100 mg Oral BID  . feeding supplement (ENSURE ENLIVE)  237 mL Oral TID BM  . ferrous sulfate  325 mg Oral TID PC  . furosemide  40 mg Oral Daily  . hydrOXYzine  25 mg Oral QHS  . latanoprost  1 drop Both Eyes QHS  . levothyroxine  75 mcg Oral Q0600  . mupirocin ointment  1 application Nasal BID  . oxyCODONE  5-15 mg Oral Q4H  . polyethylene glycol  17 g Oral BID  . predniSONE  5 mg Oral Q breakfast  . umeclidinium-vilanterol  1 puff Inhalation Daily    Social History  Substance Use Topics  . Smoking status: Former Smoker    Packs/day: 0.05    Years: 60.00    Types: Cigarettes    Quit date: 07/11/2016  . Smokeless tobacco: Never Used  . Alcohol use No    Family History  Problem Relation Age of Onset  . Heart disease Mother   . Cancer Father        lung    Review of Systems  Constitutional: Negative for fever, chills, diaphoresis, activity change, appetite change, fatigue and unexpected weight change.  HENT: Negative for congestion, sore throat, rhinorrhea, sneezing, trouble  swallowing and sinus pressure.  Eyes: Negative for photophobia and visual disturbance.  Respiratory: Negative for cough, chest tightness, shortness of breath, wheezing and stridor.  Cardiovascular: Negative for chest pain, palpitations and leg swelling.  Gastrointestinal: Negative for nausea, vomiting, abdominal pain, diarrhea, constipation, blood in stool, abdominal distention and anal bleeding.  Genitourinary: Negative for dysuria, hematuria, flank pain and difficulty urinating.  Musculoskeletal: Negative for myalgias, back pain, joint swelling, arthralgias and gait problem.  Skin: Negative for color change, pallor, rash and wound.  Neurological: Negative for dizziness, tremors, weakness and light-headedness.  Hematological: Negative for adenopathy. Does not bruise/bleed easily.  Psychiatric/Behavioral: Negative for behavioral problems, confusion, sleep disturbance, dysphoric mood, decreased concentration and agitation.     OBJECTIVE: Temp:  [97.1 F (36.2 C)-98.4 F (36.9 C)] 98 F (36.7 C) (08/07 1204) Pulse Rate:  [61-134] 93 (08/07 1400) Resp:  [7-23] 16 (08/07 1400) BP: (87-142)/(54-97) 124/82 (08/07 1400) SpO2:  [93 %-100 %] 93 % (08/07 1400) Physical Exam  Constitutional:  oriented to person, place, and time. appears well-developed and well-nourished. No distress.  HENT: [place]/AT, PERRLA, no scleral icterus Mouth/Throat: Oropharynx is clear and moist. No oropharyngeal exudate.  Cardiovascular: Normal rate, regular rhythm and normal heart sounds. Exam reveals no gallop and no friction rub.  No murmur heard.  Pulmonary/Chest: Effort normal and breath sounds normal. No respiratory distress.  has no wheezes.  Neck = supple, no nuchal rigidity Abdominal: Soft. Bowel sounds are normal.  exhibits no distension. There is no tenderness.  Lymphadenopathy: no cervical adenopathy. No axillary adenopathy Neurological: alert and oriented to person, place, and time.  Ext: left knee is  wrapped with accordian drain in place. Marked deformity to hands and feet from RA Skin: Skin is warm and dry. Scattered echymosis, frail appearing Psychiatric: a normal mood and affect.  behavior is normal.    LABS: Results for orders placed or performed during the hospital encounter of 10/16/16 (from the past 48 hour(s))  Aerobic/Anaerobic Culture (surgical/deep wound)     Status: None (Preliminary result)   Collection Time: 10/16/16  1:16 PM  Result Value Ref Range   Specimen Description SYNOVIAL LEFT KNEE    Special Requests NONE    Gram Stain      NO ORGANISMS SEEN ABUNDANT WBC PRESENT, PREDOMINANTLY PMN Gram Stain Report Called to,Read Back By and Verified With: P.COTTA RN AT 6010 ON 10/16/16 BY S.VANHOORNE MLS    Culture      FEW STAPHYLOCOCCUS AUREUS SUSCEPTIBILITIES TO FOLLOW  Performed at [facility], [place] [address] [place], [place] [address]    Report Status PENDING   CBC     Status: Abnormal   Collection Time: 10/17/16  3:15 AM  Result Value Ref Range   WBC 12.9 (H) 4.0 - 10.5 K/uL   RBC 3.91 3.87 - 5.11 MIL/uL   Hemoglobin 9.6 (L) 12.0 - 15.0 g/dL   HCT 31.8 (L) 36.0 - 46.0 %   MCV 81.3 78.0 - 100.0 fL   MCH 24.6 (L) 26.0 - 34.0 pg   MCHC 30.2 30.0 - 36.0 g/dL   RDW 19.3 (H) 11.5 - 15.5 %   Platelets 125 (L) 150 - 400 K/uL  Basic metabolic panel     Status: Abnormal   Collection Time: 10/17/16  3:15 AM  Result Value Ref Range   Sodium 140 135 - 145 mmol/L   Potassium 5.6 (H) 3.5 - 5.1 mmol/L   Chloride 107 101 - 111 mmol/L   CO2 23 22 - 32 mmol/L   Glucose, Bld 139 (H) 65 - 99 mg/dL   BUN 32 (H) 6 - 20 mg/dL   Creatinine, Ser 0.99 0.44 - 1.00 mg/dL   Calcium 8.5 (L) 8.9 - 10.3 mg/dL   GFR calc non Af Amer 53 (L) >60 mL/min   GFR calc Af Amer >60 >60 mL/min    Comment: (NOTE) The eGFR has been calculated using the CKD EPI equation. This calculation has not been validated in all clinical situations. eGFR's persistently <60 mL/min signify possible  Chronic Kidney Disease.    Anion gap 10 5 - 15    MICRO: 8/6 culture = staph aureus ( sensitivities pending)  Assessment/Plan: 82yo F with hx of RA with recurrent chronic PJI of left knee s/p POD #1 left knee resection and articulated abtx spacer placement  - will need 6 wk IV abtx. await susceptibilities to decide final abtx regimen - will get sed rate and crp - will need life long suppression either doxy or cephalexin pending of susceptibilities  Poor uop = continue to monitor with bladder scan in a few hours. Does not appear dehydrated clinically. Recent bladder scan did not suggest retention.  RA= avoid biologics when receiving abtx.

## 2016-10-17 NOTE — Progress Notes (Signed)
Patient ID: ARLYSS WEATHERSBY, female   DOB: 1934/08/20, 82 y.o.   MRN: [id number] Subjective: 1 Day Post-Op Procedure(s) (LRB): RESECTION LEFT TOTAL KNEE ARTHROPLASTY AND PLACEMENT OF ANTIBIOTIC SPACERS  (Left)    Patient reports pain as mild.  Did not require a lot of pain meds during night, slept well.  Focused on bed controls this am  Objective:   VITALS:   Vitals:   10/17/16 0635 10/17/16 0721  BP: 127/84   Pulse: (!) 106   Resp: 16   Temp:  (!) 97.1 F (36.2 C)    Neurovascular intact Incision: dressing C/D/I  HV output overnight 100cc- maintain for now  LABS  Recent Labs  10/17/16 0315  HGB 9.6*  HCT 31.8*  WBC 12.9*  PLT 125*     Recent Labs  10/17/16 0315  NA 140  K 5.6*  BUN 32*  CREATININE 0.99  GLUCOSE 139*    No results for input(s): LABPT, INR in the last 72 hours.   Assessment/Plan: 1 Day Post-Op Procedure(s) (LRB): RESECTION LEFT TOTAL KNEE ARTHROPLASTY AND PLACEMENT OF ANTIBIOTIC SPACERS  (Left)   Advance diet Up with therapy  Ok to transfer out of unit if remains stable medically PWB LE Maintain HV drain until output less than 50cc ID consult placed yesterday VANCO per pharmacy for now

## 2016-10-18 ENCOUNTER — Inpatient Hospital Stay (HOSPITAL_COMMUNITY): Payer: Medicare Other

## 2016-10-18 DIAGNOSIS — Z89529 Acquired absence of unspecified knee: Secondary | ICD-10-CM

## 2016-10-18 LAB — BASIC METABOLIC PANEL
ANION GAP: 13 (ref 5–15)
BUN: 49 mg/dL — ABNORMAL HIGH (ref 6–20)
CALCIUM: 8.6 mg/dL — AB (ref 8.9–10.3)
CO2: 20 mmol/L — ABNORMAL LOW (ref 22–32)
CREATININE: 1.7 mg/dL — AB (ref 0.44–1.00)
Chloride: 103 mmol/L (ref 101–111)
GFR, EST AFRICAN AMERICAN: 32 mL/min — AB (ref >60)
GFR, EST NON AFRICAN AMERICAN: 27 mL/min — AB (ref >60)
Glucose, Bld: 140 mg/dL — ABNORMAL HIGH (ref 65–99)
Potassium: 5.4 mmol/L — ABNORMAL HIGH (ref 3.5–5.1)
Sodium: 136 mmol/L (ref 135–145)

## 2016-10-18 LAB — CBC
HCT: 28.2 % — ABNORMAL LOW (ref 36.0–46.0)
Hemoglobin: 8.6 g/dL — ABNORMAL LOW (ref 12.0–15.0)
MCH: 24.7 pg — ABNORMAL LOW (ref 26.0–34.0)
MCHC: 30.5 g/dL (ref 30.0–36.0)
MCV: 81 fL (ref 78.0–100.0)
PLATELETS: 226 10*3/uL (ref 150–400)
RBC: 3.48 MIL/uL — ABNORMAL LOW (ref 3.87–5.11)
RDW: 19.2 % — AB (ref 11.5–15.5)
WBC: 12.9 10*3/uL — AB (ref 4.0–10.5)

## 2016-10-18 MED ORDER — OXYCODONE HCL 5 MG PO TABS: 5.0000 mg | ORAL_TABLET | ORAL | Status: DC | PRN

## 2016-10-18 MED ORDER — ASPIRIN 81 MG PO CHEW: 81.0000 mg | CHEWABLE_TABLET | Freq: Two times a day (BID) | ORAL | 0 refills | Status: AC

## 2016-10-18 MED ORDER — CEFAZOLIN SODIUM-DEXTROSE 2-4 GM/100ML-% IV SOLN
2.0000 g | Freq: Three times a day (TID) | INTRAVENOUS | Status: DC
  Administered 2016-10-18 – 2016-10-19 (×2): 2 g via INTRAVENOUS
  Filled 2016-10-18 (×3): qty 100

## 2016-10-18 MED ORDER — DOCUSATE SODIUM 100 MG PO CAPS: 100.0000 mg | ORAL_CAPSULE | Freq: Two times a day (BID) | ORAL | 0 refills | Status: AC

## 2016-10-18 MED ORDER — SODIUM CHLORIDE 0.9% FLUSH
10.0000 mL | INTRAVENOUS | Status: DC | PRN
  Administered 2016-10-20 (×2): 10 mL
  Filled 2016-10-18 (×2): qty 40

## 2016-10-18 MED ORDER — OXYCODONE HCL 5 MG PO TABS: 5.0000 mg | ORAL_TABLET | ORAL | 0 refills | Status: AC | PRN

## 2016-10-18 MED ORDER — METHOCARBAMOL 500 MG PO TABS: 500.0000 mg | ORAL_TABLET | Freq: Four times a day (QID) | ORAL | 0 refills | Status: AC | PRN

## 2016-10-18 MED ORDER — ACETAMINOPHEN 500 MG PO TABS: 1000.0000 mg | ORAL_TABLET | Freq: Three times a day (TID) | ORAL | 0 refills | Status: AC

## 2016-10-18 NOTE — Plan of Care (Signed)
Problem: Education: Goal: Knowledge of [facility] Education information/materials will improve Outcome: Adequate for Discharge Frequent attempts at reorientation, pt frequently confused and needing frequent reminders  Problem: Health Behavior/Discharge Planning: Goal: Ability to manage health-related needs will improve Outcome: Not Met (add Reason) Pt will need SNF at discharge  Problem: Activity: Goal: Risk for activity intolerance will decrease Outcome: Not Applicable Date Met: 83/07/35 Pt is bed bound

## 2016-10-18 NOTE — NC FL2 (Signed)
[place] MEDICAID FL2 LEVEL OF CARE SCREENING TOOL     IDENTIFICATION  Patient Name: [name] Birthdate: Jun 06, 1934 Sex: female Admission Date (Current Location): 10/16/2016  [facility] and [place] Number:  Herbalist and Address:  [facility],  [place] [address], [place]      Provider Number: [id number]  Attending Physician Name and Address:  Paralee Cancel, MD  Relative Name and Phone Number:       Current Level of Care: Hospital Recommended Level of Care: [place] Prior Approval Number:    Date Approved/Denied:   PASRR Number: [id number]  Discharge Plan: SNF    Current Diagnoses: Patient Active Problem List   Diagnosis Date Noted  . Acquired absence of knee joint following explantation of joint prosthesis with presence of antibiotic-impregnated cement spacer 10/16/2016  . Pulmonary infiltrates on CXR 09/16/2016  . Pleural effusion, bilateral 09/16/2016  . Chronic respiratory failure with hypoxia ([place]) 09/16/2016  . COPD   GOLD 0  09/14/2016  . UTI (urinary tract infection) 04/27/2016  . Chronic diastolic CHF (congestive heart failure) ([place]) 04/27/2016  . Fall against object 03/30/2016  . Venous ulcer of left leg ([place]) 10/18/2015  . Meningiomas, multiple ([place]) 10/18/2015  . Weakness 08/10/2015  . Malnutrition of moderate degree 07/07/2015  . S/P left TK reimplantation 07/05/2015  . S/P revision of total knee 07/05/2015  . Persistent atrial fibrillation ([place])   . Superficial thrombophlebitis 04/20/2015  . Left TK resection / spacer 03/01/2015  . Atrial fibrillation ([place]) [I48.91] 12/14/2014  . Long term (current) use of anticoagulants [Z79.01] 12/14/2014  . Adhesive capsulitis of right shoulder 12/10/2014  . Swelling of right upper extremity 11/26/2014  . Acute on chronic diastolic CHF (congestive heart failure), NYHA class 1 ([place]) 10/29/2014  . Anemia, iron deficiency 10/29/2014  . Infection of prosthetic  left knee joint ([place]) 10/13/2014  . Benign essential HTN   . S/P left TKA 06/16/2014  . Cervical spondylosis with myelopathy 02/17/2014  . Chronic venous stasis dermatitis of both lower extremities 09/08/2013  . Lumbar stenosis with neurogenic claudication 08/05/2013  . Hypothyroidism   . Rheumatoid arthritis ([place]) 11/22/2011  . Ulcerative colitis, chronic ([place]) 11/22/2011    Orientation RESPIRATION BLADDER Height & Weight     Self, Time, Situation, Place  Normal External catheter Weight: 65.4 kg (144 lb 1.6 oz) Height:  5\' 2"  (157.5 cm)  BEHAVIORAL SYMPTOMS/MOOD NEUROLOGICAL BOWEL NUTRITION STATUS  Other (Comment) (no behaviors)   Continent Diet  AMBULATORY STATUS COMMUNICATION OF NEEDS Skin   Extensive Assist Verbally Surgical wounds                       Personal Care Assistance Level of Assistance  Bathing, Feeding, Dressing Bathing Assistance: Maximum assistance Feeding assistance: Independent Dressing Assistance: Maximum assistance Total Care Assistance: Maximum assistance   Functional Limitations Info  Sight, Hearing, Speech Sight Info: Adequate Hearing Info: Adequate Speech Info: Adequate    SPECIAL CARE FACTORS FREQUENCY  PT (By licensed PT), OT (By licensed OT)     PT Frequency: 5x wk OT Frequency: 5x wk            Contractures Contractures Info: Not present    Additional Factors Info  Code Status, Psychotropic Code Status Info: Full Code   Psychotropic Info: wellbutrin, valium         Current Medications (10/18/2016):  This is the current hospital active medication list Current Facility-Administered Medications  Medication Dose  Route Frequency Provider Last Rate Last Dose  . 0.9 %  sodium chloride infusion   Intravenous Continuous Danae Orleans, PA-C 100 mL/hr at 10/17/16 1810    . acetaminophen (TYLENOL) tablet 1,000 mg  1,000 mg Oral Q8H Babish, Rodman Key, PA-C   1,000 mg at 10/18/16 4098  . albuterol (PROVENTIL) (2.5 MG/3ML) 0.083%  nebulizer solution 2.5 mg  2.5 mg Nebulization Q6H PRN Janeece Riggers, MD      . alum & mag hydroxide-simeth (MAALOX/MYLANTA) 200-200-20 MG/5ML suspension 15 mL  15 mL Oral Q4H PRN Danae Orleans, PA-C      . aspirin chewable tablet 81 mg  81 mg Oral BID Danae Orleans, PA-C   81 mg at 10/17/16 2125  . balsalazide (COLAZAL) capsule 2,250 mg  2,250 mg Oral Q12H Danae Orleans, PA-C   2,250 mg at 10/17/16 1031  . bisacodyl (DULCOLAX) suppository 10 mg  10 mg Rectal Daily PRN Babish, Matthew, PA-C      . buPROPion [facility] SR) 12 hr tablet 150 mg  150 mg Oral BID WC Danae Orleans, PA-C   150 mg at 10/17/16 1239  . Chlorhexidine Gluconate Cloth 2 % PADS 6 each  6 each Topical Q0600 Paralee Cancel, MD   6 each at 10/18/16 [phone]  . diazepam (VALIUM) tablet 5 mg  5 mg Oral Q12H Danae Orleans, PA-C   5 mg at 10/17/16 1033  . diltiazem (CARDIZEM CD) 24 hr capsule 120 mg  120 mg Oral Daily Danae Orleans, PA-C   120 mg at 10/17/16 1034  . diphenhydrAMINE (BENADRYL) capsule 25 mg  25 mg Oral Q6H PRN Danae Orleans, PA-C      . docusate sodium (COLACE) capsule 100 mg  100 mg Oral BID Babish, Matthew, PA-C   100 mg at 10/17/16 1034  . feeding supplement (ENSURE ENLIVE) (ENSURE ENLIVE) liquid 237 mL  237 mL Oral TID BM Danae Orleans, PA-C   237 mL at 10/17/16 2125  . ferrous sulfate tablet 325 mg  325 mg Oral TID PC Danae Orleans, PA-C   325 mg at 10/17/16 1755  . HYDROmorphone (DILAUDID) injection 0.5-2 mg  0.5-2 mg Intravenous Q2H PRN Danae Orleans, PA-C      . hydrOXYzine (ATARAX/VISTARIL) tablet 25 mg  25 mg Oral QHS Paralee Cancel, MD   25 mg at 10/16/16 2151  . latanoprost (XALATAN) 0.005 % ophthalmic solution 1 drop  1 drop Both Eyes QHS Danae Orleans, PA-C   1 drop at 10/16/16 2149  . levothyroxine (SYNTHROID, LEVOTHROID) tablet 75 mcg  75 mcg Oral Q0600 Danae Orleans, PA-C   75 mcg at 10/18/16 4782  . magnesium citrate solution 1 Bottle  1 Bottle Oral Once PRN Babish, Matthew, PA-C       . menthol-cetylpyridinium (CEPACOL) lozenge 3 mg  1 lozenge Oral PRN Danae Orleans, PA-C       Or  . phenol (CHLORASEPTIC) mouth spray 1 spray  1 spray Mouth/Throat PRN Babish, Matthew, PA-C      . methocarbamol (ROBAXIN) tablet 500 mg  500 mg Oral Q6H PRN Danae Orleans, PA-C       Or  . methocarbamol (ROBAXIN) 500 mg in dextrose 5 % 50 mL IVPB  500 mg Intravenous Q6H PRN Babish, Matthew, PA-C      . metoCLOPramide (REGLAN) tablet 5-10 mg  5-10 mg Oral Q8H PRN Danae Orleans, PA-C       Or  . metoCLOPramide (REGLAN) injection 5-10 mg  5-10 mg Intravenous Q8H PRN Danae Orleans, PA-C      .  mupirocin ointment (BACTROBAN) 2 % 1 application  1 application Nasal BID Paralee Cancel, MD   1 application at 12/87/86 2200  . ondansetron (ZOFRAN) tablet 4 mg  4 mg Oral Q6H PRN Danae Orleans, PA-C       Or  . ondansetron [facility]) injection 4 mg  4 mg Intravenous Q6H PRN Danae Orleans, PA-C      . oxyCODONE (Oxy IR/ROXICODONE) immediate release tablet 5-10 mg  5-10 mg Oral Q4H PRN Babish, Matthew, PA-C      . polyethylene glycol (MIRALAX / GLYCOLAX) packet 17 g  17 g Oral BID Danae Orleans, PA-C   17 g at 10/17/16 1035  . predniSONE (DELTASONE) tablet 5 mg  5 mg Oral Q breakfast Danae Orleans, PA-C   5 mg at 10/17/16 1036  . umeclidinium-vilanterol (ANORO ELLIPTA) 62.5-25 MCG/INH 1 puff  1 puff Inhalation Daily Danae Orleans, PA-C   1 puff at 10/17/16 1000     Discharge Medications: Please see discharge summary for a list of discharge medications.  Relevant Imaging Results:  Relevant Lab Results:   Additional Information SS # [id number]  Corban Kistler, Randall An, LCSW

## 2016-10-18 NOTE — Progress Notes (Signed)
Plan for d/c to SNF, discharge planning per CSW. [phone] 

## 2016-10-18 NOTE — Progress Notes (Signed)
Peripherally Inserted Central Catheter/Midline Placement  The IV Nurse has discussed with the patient and/or persons authorized to consent for the patient, the purpose of this procedure and the potential benefits and risks involved with this procedure.  The benefits include less needle sticks, lab draws from the catheter, and the patient may be discharged home with the catheter. Risks include, but not limited to, infection, bleeding, blood clot (thrombus formation), and puncture of an artery; nerve damage and irregular heartbeat and possibility to perform a PICC exchange if needed/ordered by physician.  Alternatives to this procedure were also discussed.  B[facility] Power PICC patient education guide, fact sheet on infection prevention and patient information card has been provided to patient /or left at bedside.    PICC/Midline Placement Documentation  PICC Single Lumen 10/18/16 PICC Right Brachial 40 cm 0 cm (Active)  Indication for Insertion or Continuance of Line Home intravenous therapies (PICC only) 10/18/2016  1:00 PM  Exposed Catheter (cm) 0 cm 10/18/2016  1:00 PM  Dressing Change Due 10/25/16 10/18/2016  1:00 PM       Jule Economy Horton 10/18/2016, 1:24 PM

## 2016-10-18 NOTE — Clinical Social Work Note (Addendum)
Clinical Social Work Assessment  Patient Details  Name: [name] MRN: [id number] Date of Birth: [birth year]  Date of referral:  10/18/16               Reason for consult:  Facility Placement, Discharge Planning                Permission sought to share information with:  Facility Art therapist granted to share information::  Yes, Verbal Permission Granted  Name::        Agency::     Relationship::     Contact Information:     Housing/Transportation Living arrangements for the past 2 months:  [place] of Information:  Patient, Spouse Patient Interpreter Needed:  None Criminal Activity/Legal Involvement Pertinent to Current Situation/Hospitalization:  No - Comment as needed Significant Relationships:  Spouse Lives with:  Facility Resident Do you feel safe going back to the place where you live?  Yes Need for family participation in patient care:  Yes (Comment)  Care giving concerns:  Spouse reports that pt is in bed all day long at SNF. " My wife is never out of bed. She isn't getting PT at [facility]." CSW reassured spouse that PT will be ordered for pt when she returned to SNF. Pt also reports that, " the days are very long at [facility]."    Facilities manager / plan:  Pt hospitalized from [facility] ( [facility] ) on 10/16/16 for RESECTION LEFT TOTAL KNEE ARTHROPLASTY AND PLACEMENT OF ANTIBIOTIC SPACERS  (Left). PT recommends SNF at dc. CSW met with pt / spoke with spouse to assist with dc planning. Pt / spouse agree with plan for pt to return to [facility] ( [facility] ) at dc. Clinicals have been to SNF for review. [facility] will readmit pt once stable for dc.  Employment status:  Retired Nurse, adult PT Recommendations:  [place] / Referral to community resources:     Patient/Family's Response to care:  Pt / spouse are in agreement with plan to return to [facility] at [employer].  Patient/Family's Understanding  of and Emotional Response to Diagnosis, Current Treatment, and Prognosis:  Pt / spouse are aware of pt's medical status. Pt / spouse are hopeful this will be last surgery needed. Support / reassurance provided.  Emotional Assessment Appearance:  Appears stated age Attitude/Demeanor/Rapport:  Other (cooperative) Affect (typically observed):  Appropriate, Calm Orientation:  Oriented to Self, Oriented to Place, Oriented to  Time, Oriented to Situation Alcohol / Substance use:  Not Applicable Psych involvement (Current and /or in the community):  No (Comment)  Discharge Needs  Concerns to be addressed:  Discharge Planning Concerns Readmission within the last 30 days:  No Current discharge risk:  None Barriers to Discharge:  No Barriers Identified  11:19 :  CSW spoke with Admission Coordinator at SNF this am. [facility] reports that pt is confused at times. Pt is a LTC resident at [facility] and was not receiving PT prior to hospitaliaztion ( no skilled need). Pt is encouraged to participate in activities at SNF but she declines. AC feels pt may be unhappy because she wants to return home, which is not possible.     [name]  [phone] 10/18/2016, 10:11 AM

## 2016-10-18 NOTE — Progress Notes (Addendum)
Pharmacy Antibiotic Note  [name] is a 82 y.o. female hx recurrent left TKA presented to [facility]  10/16/2016 for resection of left TKA and placement of articulating antibiotic spacer.  To start vancomycin post-op for knee infection.  Today, 10/18/2016  Day #3 antibiotics  SCr increased this morning to 1.70  Poor UOP documented in ID note   pre- surgical scr was 0.69 on 8/2  WBC unchanged  Afebrile  Few S. Aureus in L knee fluid - susc pending   Plan:  Hold vancomycin for AKI  Random vancomycin level in am  Daily SCr  Await final susceptibilities and final antibiotic selection.  Consider avoiding vancomycin if possible  Suggest stop celecoxib  Evaluate if appropriate to hold furosemide _________________________   Height: 5\' 2"  (157.5 cm) Weight: 144 lb 1.6 oz (65.4 kg) IBW/kg (Calculated) : 50.1  Temp (24hrs), Avg:97.9 F (36.6 C), Min:97.1 F (36.2 C), Max:98.6 F (37 C)   Recent Labs Lab 10/12/16 1116 10/17/16 0315 10/18/16 0620  WBC  --  12.9* 12.9*  CREATININE 0.69 0.99 1.70*    Estimated Creatinine Clearance: 23.8 mL/min (A) (by C-G formula based on SCr of 1.7 mg/dL (H)).    No Known Allergies   Antimicrobials this admission:  8/6 vanc>>  Dose adjustments this admission:   Microbiology results:  8/6 left knee synovial fluid: few staph aureus 8/2 MRSA PCR +  8/2 MSSA PCR +   Thank you for allowing pharmacy to be a part of this patient's care.  Doreene Eland, PharmD, BCPS.   Pager: [phone] 10/18/2016 7:25 AM

## 2016-10-18 NOTE — Progress Notes (Signed)
OT Cancellation Note  Patient Details Name: LAYSHA CHILDERS MRN: [id number] DOB: 11-29-1934   Cancelled Treatment:    Reason Eval/Treat Not Completed: OT screened; will defer further OT to SNF. Pt is a LTC resident.  Dazja Houchin 10/18/2016, 12:39 PM  Lesle Chris, OTR/L [phone] 10/18/2016

## 2016-10-18 NOTE — Progress Notes (Signed)
[facility] for Infectious Disease    Date of Admission:  10/16/2016   Total days of antibiotics 3   ID: [name] is a 82 y.o. female with recurrent PJI , MSSA from culture Active Problems:   Acquired absence of knee joint following explantation of joint prosthesis with presence of antibiotic-impregnated cement spacer   Subjective:  Afebrile.  Medications:  . acetaminophen  1,000 mg Oral Q8H  . aspirin  81 mg Oral BID  . balsalazide  2,250 mg Oral Q12H  . buPROPion  150 mg Oral BID WC  . Chlorhexidine Gluconate Cloth  6 each Topical Q0600  . diazepam  5 mg Oral Q12H  . diltiazem  120 mg Oral Daily  . docusate sodium  100 mg Oral BID  . feeding supplement (ENSURE ENLIVE)  237 mL Oral TID BM  . ferrous sulfate  325 mg Oral TID PC  . hydrOXYzine  25 mg Oral QHS  . latanoprost  1 drop Both Eyes QHS  . levothyroxine  75 mcg Oral Q0600  . mupirocin ointment  1 application Nasal BID  . polyethylene glycol  17 g Oral BID  . predniSONE  5 mg Oral Q breakfast  . umeclidinium-vilanterol  1 puff Inhalation Daily     Microbiology: 8/6 tissue cx - MSSA Studies/Results: Dg Chest Port 1 View  Result Date: 10/18/2016 CLINICAL DATA:  PICC line position EXAM: PORTABLE CHEST 1 VIEW COMPARISON:  Chest radiograph 09/14/2016 FINDINGS: Left arm approach PICC line tip is in the lower SVC. Unchanged streaky atelectasis in the right upper lobe. Right hemidiaphragm remains elevated. Small left pleural effusion. No pulmonary edema. IMPRESSION: PICC line tip in the lower SVC. Electronically Signed   By: Ulyses Jarred M.D.   On: 10/18/2016 13:50     Assessment/Plan: MSSA prosthetic joint infection = will d/c vancomycin and start cefazolin 2gm IV Q 8hr. Plan to treat for 6 wk of treatment. Home health orders listed below. Will contact home health.  Diagnosis: pji  Culture Result: MSSA  No Known Allergies  OPAT Orders Discharge antibiotics: cefazolin  Duration: 6 wk End  Date: Sep 18th  [place] Per Protocol:  Labs weekly while on IV antibiotics: _x_ CBC with differential _x_ BMP _x_ CRP _x_ ESR   _x_ Please pull PIC at completion of IV antibiotics  Fax weekly labs to [phone]  Clinic Follow Up Appt: 4-6 wk  @ [place], [facility] Cell: [phone] Pager: [phone]  10/18/2016, 5:24 PM

## 2016-10-18 NOTE — Progress Notes (Signed)
Physical Therapy Treatment Patient Details Name: [name] MRN: [id number] DOB: 1934-09-22 Today's Date: 10/18/2016    History of Present Illness Pt is a 82 y.o. female s/p resection of L TKA and placement of an antiobiotic spacer     PT Comments    Pt very cooperative but distractible.  Pt participated in bil LE therex, bed mobility, sitting balance and attempted standing.   Follow Up Recommendations  SNF     Equipment Recommendations  None recommended by PT    Recommendations for Other Services       Precautions / Restrictions Precautions Precautions: Fall;Knee Restrictions Weight Bearing Restrictions: Yes LLE Weight Bearing: Partial weight bearing Other Position/Activity Restrictions: PWB, ROM as tolerated     Mobility  Bed Mobility Overal bed mobility: Needs Assistance Bed Mobility: Supine to Sit;Sit to Supine     Supine to sit: Max assist;+2 for physical assistance Sit to supine: Max assist;+2 for physical assistance;+2 for safety/equipment   General bed mobility comments: Pt requires +2 max assist for simultaneous LE and trunk management while transitioning to/from sit at EOB   Transfers Overall transfer level: Needs assistance Equipment used: Rolling walker (2 wheeled) Transfers: Sit to/from Stand Sit to Stand: Max assist;+2 physical assistance;+2 safety/equipment;From elevated surface         General transfer comment: Sit to stand attempted - pt achieving semi-erect posture but unable to maintain..  Pt declined transfer to chair  Ambulation/Gait             General Gait Details: Pt is non-ambulatory at this time.     Stairs            Wheelchair Mobility    Modified Rankin (Stroke Patients Only)       Balance Overall balance assessment: Needs assistance Sitting-balance support: Bilateral upper extremity supported Sitting balance-Leahy Scale: Poor Sitting balance - Comments: Pt initially requiring mod assist to correct for  posterior and right lean but with increased time and cueing able to achieve sitting at EOB with sup only                                    Cognition Arousal/Alertness: Awake/alert Behavior During Therapy: WFL for tasks assessed/performed Overall Cognitive Status: No family/caregiver present to determine baseline cognitive functioning                                        Exercises General Exercises - Lower Extremity Ankle Circles/Pumps: AROM;Both;20 reps;Supine Heel Slides: AAROM;Both;10 reps;Supine Hip ABduction/ADduction: AAROM;Both;10 reps;Supine Straight Leg Raises: AAROM;Both;10 reps;Supine    General Comments        Pertinent Vitals/Pain Pain Assessment: Faces Faces Pain Scale: Hurts a little bit Pain Location: L knee Pain Descriptors / Indicators: Discomfort Pain Intervention(s): Limited activity within patient's tolerance;Monitored during session;Premedicated before session;Ice applied    Home Living                      Prior Function            PT Goals (current goals can now be found in the care plan section) Acute Rehab PT Goals Patient Stated Goal: None stated at this time  PT Goal Formulation: With patient Time For Goal Achievement: 10/31/16 Potential to Achieve Goals: Fair Progress towards PT goals: Progressing toward goals  Frequency    Min 5X/week      PT Plan Current plan remains appropriate    Co-evaluation              AM-PAC PT "6 Clicks" Daily Activity  Outcome Measure  Difficulty turning over in bed (including adjusting bedclothes, sheets and blankets)?: Total Difficulty moving from lying on back to sitting on the side of the bed? : Total Difficulty sitting down on and standing up from a chair with arms (e.g., wheelchair, bedside commode, etc,.)?: Total Help needed moving to and from a bed to chair (including a wheelchair)?: Total Help needed walking in hospital room?: Total Help  needed climbing 3-5 steps with a railing? : Total 6 Click Score: 6    End of Session Equipment Utilized During Treatment: Left knee immobilizer Activity Tolerance: Patient limited by fatigue;Patient tolerated treatment well Patient left: in bed;with call bell/phone within reach;with bed alarm set;with nursing/sitter in room Nurse Communication: Mobility status PT Visit Diagnosis: Difficulty in walking, not elsewhere classified (R26.2);Muscle weakness (generalized) (M62.81)     Time: 8403-7543 PT Time Calculation (min) (ACUTE ONLY): 45 min  Charges:  $Therapeutic Exercise: 8-22 mins $Therapeutic Activity: 23-37 mins                    G Codes:       Pg [phone]    Mylene Bow 10/18/2016, 12:43 PM

## 2016-10-18 NOTE — Progress Notes (Signed)
     Subjective: 2 Days Post-Op Procedure(s) (LRB): RESECTION LEFT TOTAL KNEE ARTHROPLASTY AND PLACEMENT OF ANTIBIOTIC SPACERS  (Left)   Patient reports pain as mild, pain controlled.  No events throughout the night.  Spilled her food on herself this morning, but otherwise doing well. Discussed a PICC line and ID input on how to proceed with IV antibiotics.  Objective:   VITALS:   Vitals:   10/17/16 1600 10/17/16 2218  BP:  (!) 121/54  Pulse:  (!) 109  Resp:  17  Temp: 98 F (36.7 C) 98.6 F (37 C)    Dorsiflexion/Plantar flexion intact Incision: dressing C/D/I Compartment soft  LABS  Recent Labs  10/17/16 0315 10/18/16 0620  HGB 9.6* 8.6*  HCT 31.8* 28.2*  WBC 12.9* 12.9*  PLT 125* 226     Recent Labs  10/17/16 0315 10/18/16 0620  NA 140 136  K 5.6* 5.4*  BUN 32* 49*  CREATININE 0.99 1.70*  GLUCOSE 139* 140*     Assessment/Plan: 2 Days Post-Op Procedure(s) (LRB): RESECTION LEFT TOTAL KNEE ARTHROPLASTY AND PLACEMENT OF ANTIBIOTIC SPACERS  (Left)   Up with therapy, if able HV drain maintained as the output is still greater than 50cc Change of some medication in due to increase BUN / Creat  Discharge to SNF eventually when ready   [place]. Nora Rooke   PAC  10/18/2016, 9:40 AM

## 2016-10-19 LAB — CREATININE, SERUM
Creatinine, Ser: 1.42 mg/dL — ABNORMAL HIGH (ref 0.44–1.00)
GFR, EST AFRICAN AMERICAN: 40 mL/min — AB (ref >60)
GFR, EST NON AFRICAN AMERICAN: 34 mL/min — AB (ref >60)

## 2016-10-19 MED ORDER — CEFAZOLIN SODIUM-DEXTROSE 2-4 GM/100ML-% IV SOLN
2.0000 g | Freq: Two times a day (BID) | INTRAVENOUS | Status: DC
  Administered 2016-10-19 – 2016-10-20 (×2): 2 g via INTRAVENOUS
  Filled 2016-10-19 (×2): qty 100
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
## 2016-10-19 NOTE — Progress Notes (Addendum)
Pharmacy Antibiotic Note  [name] is a 82 y.o. female hx recurrent left TKA presented to [facility]  10/16/2016 for resection of left TKA and placement of articulating antibiotic spacer. Patient experienced AKI while on vancomycin and was subsequently switched to Ancef with MSSA resulting in knee fluid culture.  Today, 10/19/2016  Day 4 antibiotics  Recent AKI; SCr improved today after peaking yesterday  WBC unchanged as of 8/8  Afebrile  Per ID to complete 6 wks Tx with Ancef  Plan:  Will reduce Ancef to 2g IV q12 while kidneys recover, but should be able to discharge on full dosing  Daily SCr  OPAT orders entered for Ancef 2g IV q8 hr to continue through 11/28/2016  **IV antibiotic discharge orders are pended. To discharging provider:  please sign these orders via discharge navigator, Select New Orders & click on the button choice - Manage This Unsigned Work**  _________________________   Height: 5\' 2"  (157.5 cm) Weight: 144 lb 1.6 oz (65.4 kg) IBW/kg (Calculated) : 50.1  Temp (24hrs), Avg:98.1 F (36.7 C), Min:98 F (36.7 C), Max:98.3 F (36.8 C)   Recent Labs Lab 10/12/16 1116 10/17/16 0315 10/18/16 0620 10/19/16 0411  WBC  --  12.9* 12.9*  --   CREATININE 0.69 0.99 1.70* 1.42*    Estimated Creatinine Clearance: 28.5 mL/min (A) (by C-G formula based on SCr of 1.42 mg/dL (H)).    No Known Allergies   Antimicrobials this admission:  8/6 vanc >> 8/8 8/8 ancef >>  Dose adjustments this admission:   Microbiology results:  8/6 left knee synovial fluid: MSSA 8/2 MRSA PCR +    Thank you for allowing pharmacy to be a part of this patient's care.  Reuel Boom, PharmD, BCPS Pager: [phone] 10/19/2016, 9:18 AM

## 2016-10-19 NOTE — Progress Notes (Signed)
     Subjective: 3 Days Post-Op Procedure(s) (LRB): RESECTION LEFT TOTAL KNEE ARTHROPLASTY AND PLACEMENT OF ANTIBIOTIC SPACERS  (Left)   Patient reports pain as mild, pain controlled. No events throughout the night, except for some confusion.  Continues to have drainage = 125cc. Will keep today to allow the drainage to decrease to make sure there is no further issues.   Objective:   VITALS:   Vitals:   10/18/16 2318 10/19/16 0643  BP: 130/80 109/60  Pulse: (!) 107 99  Resp: 18 15  Temp: 98.3 F (36.8 C) 98.1 F (36.7 C)    Dorsiflexion/Plantar flexion intact Incision: dressing C/D/I No cellulitis present Compartment soft  LABS  Recent Labs  10/17/16 0315 10/18/16 0620  HGB 9.6* 8.6*  HCT 31.8* 28.2*  WBC 12.9* 12.9*  PLT 125* 226     Recent Labs  10/17/16 0315 10/18/16 0620 10/19/16 0411  NA 140 136  --   K 5.6* 5.4*  --   BUN 32* 49*  --   CREATININE 0.99 1.70* 1.42*  GLUCOSE 139* 140*  --      Assessment/Plan: 3 Days Post-Op Procedure(s) (LRB): RESECTION LEFT TOTAL KNEE ARTHROPLASTY AND PLACEMENT OF ANTIBIOTIC SPACERS  (Left) Dressing changed Up with therapy Discharge to SNF when ready, hopefully tomorrow    [name]. [name]   PAC  10/19/2016, 8:13 AM

## 2016-10-19 NOTE — Progress Notes (Signed)
Physical Therapy Treatment Patient Details Name: [name] MRN: [id number] DOB: [birth year] Today's Date: 10/19/2016    History of Present Illness Pt is a 82 y.o. female s/p resection of L TKA and placement of an antiobiotic spacer     PT Comments    POD # 3 Attempted to transfer pt to EOB however pt was unable to arouse enough to participate.  Briefly opens her eyes to questions.  Stated she wanted "pizza" .  Had to use Max Move Lift to get from bed to recliner.     Follow Up Recommendations  SNF     Equipment Recommendations  None recommended by PT    Recommendations for Other Services       Precautions / Restrictions Precautions Precautions: Fall;Knee Precaution Comments: articulating antibiotic spacer  Restrictions Weight Bearing Restrictions: Yes Other Position/Activity Restrictions: PWB, ROM as tolerated     Mobility  Bed Mobility Overal bed mobility: Needs Assistance Bed Mobility: Supine to Sit;Sit to Supine Rolling: Total assist;+2 for physical assistance;+2 for safety/equipment   Supine to sit: Total assist;+2 for physical assistance;+2 for safety/equipment Sit to supine: Total assist;+2 for physical assistance;+2 for safety/equipment   General bed mobility comments: attempted sitting EOB however pt too groggy/sleepy and unable to offer any self assist present with severe posterior lean  Transfers                 General transfer comment: used Max Move Lift to assist pt OOB to recliner in an attempt increase alertness  Ambulation/Gait                 Stairs            Wheelchair Mobility    Modified Rankin (Stroke Patients Only)       Balance                                            Cognition Arousal/Alertness: Lethargic (sleepy/groggy)                                     General Comments: unable to stay arroused     did repeat "pizza"       Exercises      General Comments         Pertinent Vitals/Pain Pain Assessment: Faces Faces Pain Scale: Hurts even more Pain Location: L knee Pain Descriptors / Indicators: Grimacing Pain Intervention(s): Monitored during session;Repositioned    Home Living                      Prior Function            PT Goals (current goals can now be found in the care plan section) Progress towards PT goals: Progressing toward goals    Frequency    Min 5X/week      PT Plan Current plan remains appropriate    Co-evaluation              AM-PAC PT "6 Clicks" Daily Activity  Outcome Measure  Difficulty turning over in bed (including adjusting bedclothes, sheets and blankets)?: Total Difficulty moving from lying on back to sitting on the side of the bed? : Total Difficulty sitting down on and standing up from a chair with arms (e.g., wheelchair, bedside  commode, etc,.)?: Total Help needed moving to and from a bed to chair (including a wheelchair)?: Total Help needed walking in hospital room?: Total Help needed climbing 3-5 steps with a railing? : Total 6 Click Score: 6    End of Session Equipment Utilized During Treatment: Left knee immobilizer Activity Tolerance: Other (comment) (lethargic) Patient left: in chair;with call bell/phone within reach Nurse Communication: Need for lift equipment PT Visit Diagnosis: Difficulty in walking, not elsewhere classified (R26.2);Muscle weakness (generalized) (M62.81)     Time: 0223-3612 PT Time Calculation (min) (ACUTE ONLY): 29 min  Charges:  $Therapeutic Activity: 23-37 mins                    G Codes:       {[name]  PTA [facility]  Acute  Rehab Pager      [phone]

## 2016-10-20 LAB — BASIC METABOLIC PANEL
Anion gap: 8 (ref 5–15)
BUN: 37 mg/dL — AB (ref 6–20)
CHLORIDE: 107 mmol/L (ref 101–111)
CO2: 24 mmol/L (ref 22–32)
CREATININE: 0.91 mg/dL (ref 0.44–1.00)
Calcium: 8.1 mg/dL — ABNORMAL LOW (ref 8.9–10.3)
GFR calc Af Amer: >60 mL/min (ref >60)
GFR calc non Af Amer: 58 mL/min — ABNORMAL LOW (ref >60)
Glucose, Bld: 86 mg/dL (ref 65–99)
Potassium: 3.8 mmol/L (ref 3.5–5.1)
Sodium: 139 mmol/L (ref 135–145)

## 2016-10-20 LAB — CBC
HEMATOCRIT: 25.6 % — AB (ref 36.0–46.0)
HEMOGLOBIN: 8.3 g/dL — AB (ref 12.0–15.0)
MCH: 25.2 pg — AB (ref 26.0–34.0)
MCHC: 32.4 g/dL (ref 30.0–36.0)
MCV: 77.8 fL — AB (ref 78.0–100.0)
Platelets: 206 10*3/uL (ref 150–400)
RBC: 3.29 MIL/uL — ABNORMAL LOW (ref 3.87–5.11)
RDW: 19.2 % — ABNORMAL HIGH (ref 11.5–15.5)
WBC: 13.3 10*3/uL — ABNORMAL HIGH (ref 4.0–10.5)

## 2016-10-20 MED ORDER — CEFAZOLIN SODIUM-DEXTROSE 2-4 GM/100ML-% IV SOLN: 2.0000 g | Freq: Three times a day (TID) | INTRAVENOUS | Status: DC

## 2016-10-20 MED ORDER — CEFAZOLIN IV (FOR PTA / DISCHARGE USE ONLY): 2.0000 g | Freq: Three times a day (TID) | INTRAVENOUS | 0 refills | Status: AC

## 2016-10-20 MED ORDER — HEPARIN SOD (PORK) LOCK FLUSH 100 UNIT/ML IV SOLN
250.0000 [IU] | INTRAVENOUS | Status: AC | PRN
  Administered 2016-10-20: 250 [IU]

## 2016-10-20 NOTE — Progress Notes (Signed)
Pt is ready to return to [facility] today. Pt / spouse are in agreement with plan. [facility] transport is required. Medical necessity form completed. Scripts included in [employer]. # for report provided to nsg.  Werner Lean LCSW [phone]

## 2016-10-20 NOTE — Progress Notes (Signed)
Pt refused all of her medication this shift except her Asprin. Tried multiple times, but patient refused to take them.

## 2016-10-20 NOTE — Progress Notes (Signed)
Pharmacy Antibiotic Note  [name] is a 82 y.o. female hx recurrent left TKA presented to [facility]  10/16/2016 for resection of left TKA and placement of articulating antibiotic spacer. Patient experienced AKI while on vancomycin and was subsequently switched to Ancef with MSSA resulting in knee fluid culture.  Today, 10/20/2016  Day 5 antibiotics  Recent AKI now resolved  WBC elevated d/t recent steroids  Afebrile  Per ID to complete 6 wks Tx with Ancef  Plan:  Will adjust Ancef back to 2g IV q8 hr with return to normal CrCl.  OPAT orders entered for Ancef 2g IV q8 hr to continue through 11/28/2016  **IV antibiotic discharge orders are pended. To discharging provider:  please sign these orders via discharge navigator, Select New Orders & click on the button choice - Manage This Unsigned Work**  _________________________   Height: 5\' 2"  (157.5 cm) Weight: 144 lb 1.6 oz (65.4 kg) IBW/kg (Calculated) : 50.1  Temp (24hrs), Avg:98.5 F (36.9 C), Min:98.2 F (36.8 C), Max:98.8 F (37.1 C)   Recent Labs Lab 10/17/16 0315 10/18/16 0620 10/19/16 0411 10/20/16 0525  WBC 12.9* 12.9*  --  13.3*  CREATININE 0.99 1.70* 1.42* 0.91    Estimated Creatinine Clearance: 44.5 mL/min (by C-G formula based on SCr of 0.91 mg/dL).    No Known Allergies   Antimicrobials this admission:  8/6 vanc >> 8/8 8/8 ancef >>  Dose adjustments this admission:   Microbiology results:  8/6 left knee synovial fluid: MSSA 8/2 MRSA PCR +    Thank you for allowing pharmacy to be a part of this patient's care.  Reuel Boom, PharmD, BCPS Pager: [phone] 10/20/2016, 9:32 AM

## 2016-10-20 NOTE — Care Management Important Message (Signed)
Important Message  Patient Details  Name: LYGIA OLAES MRN: [id number] Date of Birth: [birth year]   Medicare Important Message Given:  Yes    Kerin Salen 10/20/2016, 10:10 AMImportant Message  Patient Details  Name: PAMELA INTRIERI MRN: [id number] Date of Birth: 09-08-1934   Medicare Important Message Given:  Yes    Kerin Salen 10/20/2016, 10:10 AM

## 2016-10-20 NOTE — Progress Notes (Signed)
     Subjective: 4 Days Post-Op Procedure(s) (LRB): RESECTION LEFT TOTAL KNEE ARTHROPLASTY AND PLACEMENT OF ANTIBIOTIC SPACERS  (Left)   Patient reports pain as mild, pain controlled No events throughout the night, other than some continued confusion. Appears to be similar to he state prior to surgery.  She does recognize me and remembers me from the orthopaedic office.  Drainage has decreased and Dr. Alvan Dame in agreement to pull the drain.  BUN/Creat also trending in the right direction, better than yesterday.  Ready to be discharged to skilled nursing facility.  Objective:   VITALS:   Vitals:   10/19/16 2206 10/20/16 0635  BP: 118/76 116/70  Pulse: (!) 105 99  Resp: 18 15  Temp: 98.8 F (37.1 C) 98.4 F (36.9 C)  SpO2: 96% 94%    Dorsiflexion/Plantar flexion intact Incision: dressing C/D/I No cellulitis present Compartment soft  LABS  Recent Labs  10/18/16 0620 10/20/16 0525  HGB 8.6* 8.3*  HCT 28.2* 25.6*  WBC 12.9* 13.3*  PLT 226 206     Recent Labs  10/18/16 0620 10/19/16 0411 10/20/16 0525  NA 136  --  139  K 5.4*  --  3.8  BUN 49*  --  37*  CREATININE 1.70* 1.42* 0.91  GLUCOSE 140*  --  86     Assessment/Plan: 4 Days Post-Op Procedure(s) (LRB): RESECTION LEFT TOTAL KNEE ARTHROPLASTY AND PLACEMENT OF ANTIBIOTIC SPACERS  (Left)  Up with therapy Discharge to SNF  Follow up in 2 weeks at [facility]. Follow up with OLIN,Nisreen Guise D in 2 weeks.  Contact information:  [facility] [address], [address] [place] Rhyli Depaula   PAC  10/20/2016, 7:55 AM

## 2016-10-20 NOTE — Discharge Summary (Signed)
Physician Discharge Summary  Patient ID: [name] MRN: [id number] DOB/AGE: 03-25-1934 82 y.o.  Admit date: 10/16/2016 Discharge date:  10/20/2016  Procedures:  Procedure(s) (LRB): RESECTION LEFT TOTAL KNEE ARTHROPLASTY AND PLACEMENT OF ANTIBIOTIC SPACERS  (Left)  Attending Physician:  Dr. Paralee Cancel   Admission Diagnoses:   Recurrent infection left total knee arthroplasty  Discharge Diagnoses:  Principal Problem:   Left knee abx spacer  Past Medical History:  Diagnosis Date  . Anemia   . Anxiety    sometimes   . Arthritis    rheumatoid arthritis   . Bruise    right lower extremity with opsite on  . Calcified cerebral meningioma ([place])   . Chronic back pain    "my entire spine"  . Chronic bronchitis ([place]) 10/31/2011   "prone to it; I have sinus/bronchitis problem probably q yr"  . Complication of anesthesia 2009   htn with block- for hand surgery , pt thinks anesthesia causing some long term confusion  . Depression   . Diarrhea 01/27/2015  . Dizziness   . Dyspnea    for last week per patient non prod cough   . Dysrhythmia    pt states bring off coumadin for paroxysmal atrial fibrillation for last month  . Edema   . Encounter for long-term (current) use of other medications   . Falls   . GERD (gastroesophageal reflux disease)   . H/O clavicle fracture    left  . Hard of hearing   . History of blood transfusion   . Hypertension    pcp   dr reed   [facility]  . Hypothyroidism   . Hypothyroidism   . Multiple skin tears 10/31/2011   "get them very easily; my skin is thin and I bruise easily"  . Other and unspecified hyperlipidemia   . Peripheral vascular disease ([place])   . Pneumonia 2008  . Rheumatoid arthritis(714.0)    degenerative disc disease , ra  . Senile osteoporosis   . Spinal stenosis, unspecified region other than cervical   . Superficial thrombophlebitis 04/20/2015  . Swelling of right upper extremity 11/26/2014  . Ulcerative colitis  ([place])   . Unspecified glaucoma(365.9)     HPI:    Pt is a 82 y.o. female complaining of left knee pain and left LE drainage. Pain / drainage had continually increased since the beginning. X-rays in the clinic show previous TKA. Pt has tried various conservative treatments which have failed to alleviate their symptoms. Various options are discussed with the patient.  She relates that she knows that she is of high risks for surgery.  Risks, benefits and expectations were discussed with the patient and family. Patient and family understand the risks, benefits and expectations and wishes to proceed with surgery.  PCP: No primary care provider on file.   Discharged Condition: fair  Hospital Course:  Patient underwent the above stated procedure on 10/16/2016. Patient tolerated the procedure well and brought to the recovery room in good condition and subsequently to the floor.  POD #1 BP: 127/84 ; Pulse: 106 ; Temp: 97.1 F (36.2 C) ; Resp: 16 Patient reports pain as mild.  Did not require a lot of pain meds during night, slept well.  Focused on bed controls this am. Neurovascular intact, incision: dressing C/D/I and HV output overnight 100cc- maintain for now.  LABS  Basename    HGB     9.6  HCT     31.8   POD #2  BP: 121/54 ; Pulse: 109 ; Temp: 98.6 F (37 C) ; Resp: 17 Patient reports pain as mild, pain controlled.  No events throughout the night.  Spilled her food on herself this morning, but otherwise doing well. Discussed a PICC line and ID input on how to proceed with IV antibiotics. Dorsiflexion/plantar flexion intact, incision: dressing C/D/I and compartment soft.   LABS  Basename    HGB     8.6  HCT     28.2   POD #3  BP: 109/60 ; Pulse: 99 ; Temp: 98.1 F (36.7 C) ; Resp: 15 Patient reports pain as mild, pain controlled. No events throughout the night, except for some confusion.  Continues to have drainage = 125cc. Will keep today to allow the drainage to decrease to make sure  there is no further issues.  Dorsiflexion/plantar flexion intact, incision: dressing C/D/I and compartment soft.   LABS   No new labs  POD #4  BP: 116/70 ; Pulse: 99 ; Temp: 98.4 F (36.9 C) ; Resp: 15 Patient reports pain as mild, pain controlled No events throughout the night, other than some continued confusion. Appears to be similar to he state prior to surgery.  She does recognize me and remembers me from the orthopaedic office.  Drainage has decreased and Dr. Alvan Dame in agreement to pull the drain.  BUN/Creat also trending in the right direction, better than yesterday.  Ready to be discharged to skilled nursing facility. Dorsiflexion/plantar flexion intact, incision: dressing C/D/I and compartment soft.   LABS  Basename    HGB     8.3  HCT     25.6    Discharge Exam: General appearance: alert, cooperative and no distress Extremities: Homans sign is negative, no sign of DVT, no edema, redness or tenderness in the calves or thighs and no ulcers, gangrene or trophic changes  Disposition: [place] with follow up in 2 weeks    Contact information for follow-up providers    Paralee Cancel, MD. Schedule an appointment as soon as possible for a visit in 2 week(s).   Specialty:  Orthopedic Surgery Contact information: [address] [place] [address] [phone]            Contact information for after-discharge care    Destination    [facility] Follow up.   Specialty:  [place] Contact information: [place] [phone]                  Discharge Instructions    Call MD / Call 911    Complete by:  As directed    If you experience chest pain or shortness of breath, CALL 911 and be transported to the hospital emergency room.  If you develope a fever above 101 F, pus (white drainage) or increased drainage or redness at the wound, or calf pain, call  your surgeon's office.   Change dressing    Complete by:  As directed    May need at least once a day, maybe twice a day dressing changes over the hemovac site dressing.  Needs dressing changes daily on the knee incision as well as treat the distal leg wound as prior to surgery. Call with any questions or concerns.   Constipation Prevention    Complete by:  As directed    Drink plenty of fluids.  Prune juice may be helpful.  You may use a stool softener, such as  Colace (over the counter) 100 mg twice a day.  Use MiraLax (over the counter) for constipation as needed.   Diet - low sodium heart healthy    Complete by:  As directed    Discharge instructions    Complete by:  As directed    Maintain surgical dressing until follow up in the clinic. If the edges start to pull up, may reinforce with tape. If the dressing is no longer working, may remove and cover with gauze and tape, but must keep the area dry and clean.  Follow up in 2 weeks at [facility]. Call with any questions or concerns.   Home infusion instructions [facility] May follow [place] Dosing Protocol; May administer Cathflo as needed to maintain patency of vascular access device.; Flushing of vascular access device: per [facility] Protocol: 0.9% NaCl pre/post medica...    Complete by:  As directed    Instructions:  May follow [place] Dosing Protocol   Instructions:  May administer Cathflo as needed to maintain patency of vascular access device.   Instructions:  Flushing of vascular access device: per [facility] Protocol: 0.9% NaCl pre/post medication administration and prn patency; Heparin 100 u/ml, 44m for implanted ports and Heparin 10u/ml, 545mfor all other central venous catheters.   Instructions:  May follow [facility] Anaphylaxis Protocol for First Dose Administration in the home: 0.9% NaCl at 25-50 ml/hr to maintain IV access for protocol meds. Epinephrine 0.3 ml IV/IM PRN and Benadryl 25-50 IV/IM PRN s/s of anaphylaxis.    Instructions:  [facility] Coordinator (RN) to assist per patient IV care needs in the home PRN.   Partial weight bearing    Complete by:  As directed    % Body Weight:  50   Laterality:  left   Extremity:  Lower   TED hose    Complete by:  As directed    Use stockings (TED hose) for 2 weeks on both leg(s).  You may remove them at night for sleeping.      Allergies as of 10/20/2016   No Known Allergies     Medication List    STOP taking these medications   apixaban 5 MG Tabs tablet Commonly known as:  ELIQUIS   celecoxib 200 MG capsule Commonly known as:  CELEBREX   diclofenac sodium 1 % Gel Commonly known as:  VOLTAREN   diltiazem 120 MG 24 hr capsule Commonly known as:  DILACOR XR   estradiol 0.5 MG tablet Commonly known as:  ESTRACE   furosemide 40 MG tablet Commonly known as:  LASIX   traMADol 50 MG tablet Commonly known as:  ULTRAM     TAKE these medications   acetaminophen 500 MG tablet Commonly known as:  TYLENOL Take 2 tablets (1,000 mg total) by mouth every 8 (eight) hours.   ANORO ELLIPTA 62.5-25 MCG/INH Aepb Generic drug:  umeclidinium-vilanterol Inhale 1 puff into the lungs daily. (0900)   aspirin 81 MG chewable tablet Chew 1 tablet (81 mg total) by mouth 2 (two) times daily.   balsalazide 750 MG capsule Commonly known as:  COLAZAL Take 2,250 mg by mouth 2 (two) times daily. (0900 & 2100)   barrier cream Crea Commonly known as:  non-specified Apply 1 application topically daily.   buPROPion 150 MG 12 hr tablet Commonly known as:  WELLBUTRIN SR Take 150 mg by mouth 2 (two) times daily. In the morning & at lunch   Calcium + D3 600-200 MG-UNIT Tabs Take  1 tablet by mouth daily with breakfast. (0900)   ceFAZolin IVPB Commonly known as:  ANCEF Inject 2 g into the vein every 8 (eight) hours. Indication:  Prosthetic Joint Infection (MSSA) Last Day of Therapy:  11/28/2016 Labs - Once weekly:  CBC/D and BMP, Labs - Every other  week:  ESR and CRP   [place]akins 0.5 % Soln Apply 1 application topically daily as needed (for left knee). Apply moisten gauze and cover with foam dressing daily--apply silver alginate.   diazepam 5 MG tablet Commonly known as:  VALIUM Take 1 tablet (5 mg total) by mouth every 12 (twelve) hours. What changed:  additional instructions   diltiazem 180 MG 24 hr capsule Commonly known as:  CARDIZEM CD Take 1 capsule (180 mg total) by mouth daily.   docusate sodium 100 MG capsule Commonly known as:  COLACE Take 1 capsule (100 mg total) by mouth 2 (two) times daily.   feeding supplement (ENSURE ENLIVE) Liqd Take 237 mLs by mouth 2 (two) times daily between meals. What changed:  when to take this   ferrous sulfate 325 (65 FE) MG tablet Take 325 mg by mouth daily with breakfast. (2297)   folic acid 1 MG tablet Commonly known as:  FOLVITE Take 2 mg by mouth daily. (0900)   guaiFENesin 600 MG 12 hr tablet Commonly known as:  MUCINEX Take 600 mg by mouth 2 (two) times daily. (0900 & 2100)   hydrOXYzine 25 MG capsule Commonly known as:  VISTARIL Take 25 mg by mouth at bedtime.   latanoprost 0.005 % ophthalmic solution Commonly known as:  XALATAN Place 1 drop into both eyes at bedtime. (2100)   levothyroxine 75 MCG tablet Commonly known as:  SYNTHROID, LEVOTHROID Take 1 tablet (75 mcg total) by mouth daily. What changed:  when to take this   medroxyPROGESTERone 2.5 MG tablet Commonly known as:  PROVERA Take 1 tablet (2.5 mg total) by mouth at bedtime. What changed:  additional instructions   methocarbamol 500 MG tablet Commonly known as:  ROBAXIN Take 1 tablet (500 mg total) by mouth every 6 (six) hours as needed for muscle spasms.   oxyCODONE 5 MG immediate release tablet Commonly known as:  Oxy IR/ROXICODONE Take 1-2 tablets (5-10 mg total) by mouth every 4 (four) hours as needed for moderate pain or severe pain. What changed:  when to take this   OXYGEN Inhale 2 L into  the lungs continuous.   polyethylene glycol powder powder Commonly known as:  GLYCOLAX/MIRALAX Take 17 g by mouth daily as needed (for constipation).   predniSONE 5 MG tablet Commonly known as:  DELTASONE Take 5 mg by mouth daily with breakfast. (0900)   senna-docusate 8.6-50 MG tablet Commonly known as:  Senokot-S Take 1 tablet by mouth daily as needed (for constipation.).   vitamin C 500 MG tablet Commonly known as:  ASCORBIC ACID Take 500 mg by mouth 2 (two) times daily. (0900 & 2100)   zinc sulfate 220 (50 Zn) MG capsule Take 220 mg by mouth daily. (0900)            Home Infusion Instuctions        Start     Ordered   10/20/16 0000  Home infusion instructions [facility] May follow [place] Dosing Protocol; May administer Cathflo as needed to maintain patency of vascular access device.; Flushing of vascular access device: per [facility] Protocol: 0.9% NaCl pre/post medica...    Question Answer Comment  Instructions May follow  [facility] Dosing Protocol   Instructions May administer Cathflo as needed to maintain patency of vascular access device.   Instructions Flushing of vascular access device: per [facility] Protocol: 0.9% NaCl pre/post medication administration and prn patency; Heparin 100 u/ml, 85m for implanted ports and Heparin 10u/ml, 54mfor all other central venous catheters.   Instructions May follow [facility] Anaphylaxis Protocol for First Dose Administration in the home: 0.9% NaCl at 25-50 ml/hr to maintain IV access for protocol meds. Epinephrine 0.3 ml IV/IM PRN and Benadryl 25-50 IV/IM PRN s/s of anaphylaxis.   Instructions [facility] Infusion Coordinator (RN) to assist per patient IV care needs in the home PRN.      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

## 2016-10-21 LAB — AEROBIC/ANAEROBIC CULTURE (SURGICAL/DEEP WOUND): GRAM STAIN: NONE SEEN

## 2016-10-21 LAB — AEROBIC/ANAEROBIC CULTURE W GRAM STAIN (SURGICAL/DEEP WOUND)

## 2016-10-27 DIAGNOSIS — A4901 Methicillin susceptible Staphylococcus aureus infection, unspecified site: Secondary | ICD-10-CM

## 2016-11-06 ENCOUNTER — Encounter (HOSPITAL_COMMUNITY): Payer: Self-pay | Admitting: Emergency Medicine

## 2016-11-06 ENCOUNTER — Emergency Department (HOSPITAL_COMMUNITY): Payer: Medicare Other

## 2016-11-06 ENCOUNTER — Emergency Department (HOSPITAL_COMMUNITY)
Admission: EM | Admit: 2016-11-06 | Discharge: 2016-11-06 | Disposition: A | Discharge status: Home / Self Care | Payer: Medicare Other | Attending: Emergency Medicine | Admitting: Emergency Medicine

## 2016-11-06 DIAGNOSIS — Z452 Encounter for adjustment and management of vascular access device: Secondary | ICD-10-CM | POA: Diagnosis not present

## 2016-11-06 DIAGNOSIS — Z79899 Other long term (current) drug therapy: Secondary | ICD-10-CM | POA: Diagnosis not present

## 2016-11-06 DIAGNOSIS — Y828 Other medical devices associated with adverse incidents: Secondary | ICD-10-CM | POA: Diagnosis not present

## 2016-11-06 DIAGNOSIS — J449 Chronic obstructive pulmonary disease, unspecified: Secondary | ICD-10-CM | POA: Insufficient documentation

## 2016-11-06 DIAGNOSIS — Z87891 Personal history of nicotine dependence: Secondary | ICD-10-CM | POA: Insufficient documentation

## 2016-11-06 DIAGNOSIS — I5032 Chronic diastolic (congestive) heart failure: Secondary | ICD-10-CM | POA: Insufficient documentation

## 2016-11-06 DIAGNOSIS — T82524A Displacement of infusion catheter, initial encounter: Secondary | ICD-10-CM | POA: Insufficient documentation

## 2016-11-06 DIAGNOSIS — T82528A Displacement of other cardiac and vascular devices and implants, initial encounter: Secondary | ICD-10-CM

## 2016-11-06 DIAGNOSIS — E039 Hypothyroidism, unspecified: Secondary | ICD-10-CM | POA: Insufficient documentation

## 2016-11-06 MED ORDER — CEFAZOLIN SODIUM-DEXTROSE 2-4 GM/100ML-% IV SOLN
2.0000 g | Freq: Once | INTRAVENOUS | Status: AC
Start: 2016-11-06 — End: 2016-11-06
  Administered 2016-11-06: 2 g via INTRAVENOUS
  Filled 2016-11-06: qty 100

## 2016-11-06 NOTE — ED Notes (Signed)
[facility] arrived for patient 

## 2016-11-06 NOTE — ED Provider Notes (Signed)
Patient's PICC line became dislodged earlier today. She was sent here for evaluation as her physician thought that a piece of the PICC line may have remained in her left arm. She denies any foreign body sensation on exam she is in no distress left upper extremity is ecchymotic at medial aspect of the upper arm no tenderness or swelling. Neurovascularly intact.   Orlie Dakin, MD 11/06/16 2227

## 2016-11-06 NOTE — ED Triage Notes (Signed)
Pt comes from [facility] via EMS with complaints of a possible dislodgement or "broken off" PICC line that they noticed right before EMS was called.  PICC line was placed in left bicep.  Pt reportedly has MRSA in left knee. Vitals WNL in route. EMS reports that her legs are weeping but that is per baseline.

## 2016-11-06 NOTE — ED Notes (Signed)
Called [facility] for transport.  

## 2016-11-06 NOTE — ED Notes (Signed)
Attempted to call report to facility x2.  Secretary transferred this RN twice without answer.

## 2016-11-06 NOTE — ED Notes (Signed)
Bed: OF18 Expected date:  Expected time:  Means of arrival:  Comments: 82 yr old, PICC line dislodged

## 2016-11-06 NOTE — ED Provider Notes (Signed)
[facility] DEPT Provider Note   CSN: [id number] Arrival date & time: 11/06/16  1956     History   Chief Complaint Chief Complaint  Patient presents with  . Vascular Access Problem    HPI [name] is a 82 y.o. female.  HPI  82 y.o. female with a hx of HTN, presents to the Emergency Department today due to possible "broken off" PICC line. Sent by [facility]. Pt on rehab for knee replacement. Pt on PICC line with ABX for possible MRSA in left knee. PICC line was in left Bicep until patient moved abruptly and dislodged. Staff proceeded to remove line, but worried that there is portion that remains. Denies pain. No N/V/D. No CP/SOB/ABD pain. No fevers. No swelling to left arm. No numbness/tingling. No redness to area. No other symptoms noted.   Past Medical History:  Diagnosis Date  . Anemia   . Anxiety    sometimes   . Arthritis    rheumatoid arthritis   . Bruise    right lower extremity with opsite on  . Calcified cerebral meningioma ([place])   . Chronic back pain    "my entire spine"  . Chronic bronchitis ([place]) 10/31/2011   "prone to it; I have sinus/bronchitis problem probably q yr"  . Complication of anesthesia 2009   htn with block- for hand surgery , pt thinks anesthesia causing some long term confusion  . Depression   . Diarrhea 01/27/2015  . Dizziness   . Dyspnea    for last week per patient non prod cough   . Dysrhythmia    pt states bring off coumadin for paroxysmal atrial fibrillation for last month  . Edema   . Encounter for long-term (current) use of other medications   . Falls   . GERD (gastroesophageal reflux disease)   . H/O clavicle fracture    left  . Hard of hearing   . History of blood transfusion   . Hypertension    pcp   dr reed   [facility]  . Hypothyroidism   . Hypothyroidism   . Multiple skin tears 10/31/2011   "get them very easily; my skin is thin and I bruise easily"  . Other and unspecified hyperlipidemia   .  Peripheral vascular disease ([place])   . Pneumonia 2008  . Rheumatoid arthritis(714.0)    degenerative disc disease , ra  . Senile osteoporosis   . Spinal stenosis, unspecified region other than cervical   . Superficial thrombophlebitis 04/20/2015  . Swelling of right upper extremity 11/26/2014  . Ulcerative colitis ([place])   . Unspecified glaucoma(365.9)     Patient Active Problem List   Diagnosis Date Noted  . Staph aureus infection   . Left knee abx spacer 10/16/2016  . Pulmonary infiltrates on CXR 09/16/2016  . Pleural effusion, bilateral 09/16/2016  . Chronic respiratory failure with hypoxia ([place]) 09/16/2016  . COPD   GOLD 0  09/14/2016  . UTI (urinary tract infection) 04/27/2016  . Chronic diastolic CHF (congestive heart failure) ([place]) 04/27/2016  . Fall against object 03/30/2016  . Venous ulcer of left leg ([place]) 10/18/2015  . Meningiomas, multiple ([place]) 10/18/2015  . Weakness 08/10/2015  . Malnutrition of moderate degree 07/07/2015  . S/P left TK reimplantation 07/05/2015  . S/P revision of total knee 07/05/2015  . Persistent atrial fibrillation ([place])   . Superficial thrombophlebitis 04/20/2015  . Left TK resection / spacer 03/01/2015  . Atrial fibrillation ([place]) [I48.91] 12/14/2014  . Long  term (current) use of anticoagulants [Z79.01] 12/14/2014  . Adhesive capsulitis of right shoulder 12/10/2014  . Swelling of right upper extremity 11/26/2014  . Acute on chronic diastolic CHF (congestive heart failure), NYHA class 1 ([place]) 10/29/2014  . Anemia, iron deficiency 10/29/2014  . Prosthetic joint infection ([place]) 10/13/2014  . Benign essential HTN   . S/P left TKA 06/16/2014  . Cervical spondylosis with myelopathy 02/17/2014  . Chronic venous stasis dermatitis of both lower extremities 09/08/2013  . Lumbar stenosis with neurogenic claudication 08/05/2013  . Hypothyroidism   . Rheumatoid arthritis ([place]) 11/22/2011  . Ulcerative colitis, chronic ([place]) 11/22/2011    Past  Surgical History:  Procedure Laterality Date  . ANTERIOR CERVICAL DECOMP/DISCECTOMY FUSION N/A 02/17/2014   Procedure: CERVICAL FOUR TO FIVE ANTERIOR CERVICAL DECOMPRESSION/DISCECTOMY FUSION 1 LEVEL;  Surgeon: [name], MD;  Location: [facility];  Service: Neurosurgery;  Laterality: N/A;  C4-5 Anterior cervical decompression/diskectomy/fusion  . BACK SURGERY    . CATARACT EXTRACTION Left 11/11/2013  . CATARACT EXTRACTION Right 12/30/2013  . EXCISIONAL TOTAL KNEE ARTHROPLASTY WITH ANTIBIOTIC SPACERS Left 03/01/2015   Procedure: RESECTION LEFT TOTAL KNEE ARTHROPLASTY WITH PLACEMENT OF  ANTIBIOTIC SPACERS;  Surgeon: Paralee Cancel, MD;  Location: [facility];  Service: Orthopedics;  Laterality: Left;  . EXCISIONAL TOTAL KNEE ARTHROPLASTY WITH ANTIBIOTIC SPACERS Left 10/16/2016   Procedure: RESECTION LEFT TOTAL KNEE ARTHROPLASTY AND PLACEMENT OF ANTIBIOTIC SPACERS ;  Surgeon: Paralee Cancel, MD;  Location: [facility];  Service: Orthopedics;  Laterality: Left;  120 mins  . EYE SURGERY Bilateral   . FUNCTIONAL ENDOSCOPIC SINUS SURGERY  1988  . I&D KNEE WITH POLY EXCHANGE Left 10/13/2014   Procedure: INCISION AND DRAINAGE LEFT TOTAL KNEE WITH POLY EXCHANGE;  Surgeon: Paralee Cancel, MD;  Location: [facility];  Service: Orthopedics;  Laterality: Left;  . JOINT REPLACEMENT  2009; 2006   joints 2 fingers,rt hand; joint left thumb  . POSTERIOR FUSION LUMBAR SPINE  10/31/2011   L2-3; 3-4  . POSTERIOR FUSION LUMBAR SPINE  11/2009   L3-4;  L4-5  . POSTERIOR LUMBAR FUSION N/A 08/05/13   T1, T2  . REIMPLANTATION OF TOTAL KNEE Left 07/05/2015   Procedure: REIMPLANTATION OF LEFT TOTAL KNEE WITH REMOVAL OF ANTIBIOTIC SPACER;  Surgeon: Paralee Cancel, MD;  Location: [facility];  Service: Orthopedics;  Laterality: Left;  . SINUS SURGERY WITH INSTATRAK    . SPINE SURGERY    . THYROIDECTOMY, PARTIAL  1997  . TONSILLECTOMY     "when I was a small child"  . TOTAL KNEE ARTHROPLASTY Left 06/16/2014   Procedure: TOTAL KNEE ARTHROPLASTY;   Surgeon: Paralee Cancel, MD;  Location: [facility];  Service: Orthopedics;  Laterality: Left;    OB History    No data available       Home Medications    Prior to Admission medications   Medication Sig Start Date End Date Taking? Authorizing Provider  acetaminophen (TYLENOL) 500 MG tablet Take 2 tablets (1,000 mg total) by mouth every 8 (eight) hours. 10/19/16   Danae Orleans, PA-C  aspirin 81 MG chewable tablet Chew 1 tablet (81 mg total) by mouth 2 (two) times daily. 10/19/16 11/18/16  Danae Orleans, PA-C  balsalazide (COLAZAL) 750 MG capsule Take 2,250 mg by mouth 2 (two) times daily. (0900 & 2100)    [provider]  barrier cream (NON-SPECIFIED) CREA Apply 1 application topically daily.    [provider]  buPROPion (WELLBUTRIN SR) 150 MG 12 hr tablet Take 150 mg by mouth 2 (  two) times daily. In the morning & at lunch    [provider]  Calcium Carb-Cholecalciferol (CALCIUM + D3) 600-200 MG-UNIT TABS Take 1 tablet by mouth daily with breakfast. (0900)    [provider]  ceFAZolin (ANCEF) IVPB Inject 2 g into the vein every 8 (eight) hours. Indication:  Prosthetic Joint Infection (MSSA) Last Day of Therapy:  11/28/2016 Labs - Once weekly:  CBC/D and BMP, Labs - Every other week:  ESR and CRP 10/20/16 11/29/16  Danae Orleans, PA-C  [place] 0.5 % SOLN Apply 1 application topically daily as needed (for left knee). Apply moisten gauze and cover with foam dressing daily--apply silver alginate.    [provider]  diazepam (VALIUM) 5 MG tablet Take 1 tablet (5 mg total) by mouth every 12 (twelve) hours. Patient taking differently: Take 5 mg by mouth every 12 (twelve) hours. (0900 & 2100) 05/02/16   Raiford Noble Latif, DO  diltiazem (CARDIZEM CD) 180 MG 24 hr capsule Take 1 capsule (180 mg total) by mouth daily. Patient not taking: Reported on 10/11/2016 05/03/16   Raiford Noble Latif, DO  docusate sodium (COLACE) 100 MG capsule Take 1 capsule (100 mg  total) by mouth 2 (two) times daily. 10/19/16   Danae Orleans, PA-C  feeding supplement, ENSURE ENLIVE, (ENSURE ENLIVE) LIQD Take 237 mLs by mouth 2 (two) times daily between meals. Patient taking differently: Take 237 mLs by mouth 3 (three) times daily between meals.  05/02/16   Raiford Noble Latif, DO  ferrous sulfate 325 (65 FE) MG tablet Take 325 mg by mouth daily with breakfast. (0900)    [provider]  folic acid (FOLVITE) 1 MG tablet Take 2 mg by mouth daily. (0900)    [provider]  guaiFENesin (MUCINEX) 600 MG 12 hr tablet Take 600 mg by mouth 2 (two) times daily. (0900 & 2100)    [provider]  hydrOXYzine (VISTARIL) 25 MG capsule Take 25 mg by mouth at bedtime.    [provider]  latanoprost (XALATAN) 0.005 % ophthalmic solution Place 1 drop into both eyes at bedtime. (2100)    [provider]  levothyroxine (SYNTHROID, LEVOTHROID) 75 MCG tablet Take 1 tablet (75 mcg total) by mouth daily. Patient taking differently: Take 75 mcg by mouth daily at 6 (six) AM.  01/06/16   [name], [name] L, DO  medroxyPROGESTERone (PROVERA) 2.5 MG tablet Take 1 tablet (2.5 mg total) by mouth at bedtime. Patient taking differently: Take 2.5 mg by mouth at bedtime. (2100) 01/06/16   [name], [name] L, DO  methocarbamol (ROBAXIN) 500 MG tablet Take 1 tablet (500 mg total) by mouth every 6 (six) hours as needed for muscle spasms. 10/18/16   Danae Orleans, PA-C  oxyCODONE (OXY IR/ROXICODONE) 5 MG immediate release tablet Take 1-2 tablets (5-10 mg total) by mouth every 4 (four) hours as needed for moderate pain or severe pain. 10/18/16   Danae Orleans, PA-C  OXYGEN Inhale 2 L into the lungs continuous.    [provider]  polyethylene glycol powder (GLYCOLAX/MIRALAX) powder Take 17 g by mouth daily as needed (for constipation).    [provider]  predniSONE (DELTASONE) 5 MG tablet Take 5 mg by mouth daily with breakfast. (0900)    [provider]  senna-docusate (SENOKOT-S) 8.6-50 MG tablet Take 1 tablet by mouth daily as needed (for constipation.).    [provider]  umeclidinium-vilanterol (ANORO ELLIPTA) 62.5-25 MCG/INH AEPB Inhale 1 puff into the lungs daily. (0900)  [provider]  vitamin C (ASCORBIC ACID) 500 MG tablet Take 500 mg by mouth 2 (two) times daily. (0900 & 2100)    [provider]  zinc sulfate 220 (50 Zn) MG capsule Take 220 mg by mouth daily. (0900)    [provider]    Family History Family History  Problem Relation Age of Onset  . Heart disease Mother   . Cancer Father        lung    Social History Social History  Substance Use Topics  . Smoking status: Former Smoker    Packs/day: 0.05    Years: 60.00    Types: Cigarettes    Quit date: 07/11/2016  . Smokeless tobacco: Never Used  . Alcohol use No     Allergies   Patient has no known allergies.   Review of Systems Review of Systems ROS reviewed and all are negative for acute change except as noted in the HPI.  Physical Exam Updated Vital Signs BP 132/85 (BP Location: Right Arm)   Pulse 93   Temp 98.9 F (37.2 C) (Oral)   Resp 16   SpO2 93%   Physical Exam  Constitutional: She is oriented to person, place, and time. Vital signs are normal. She appears well-developed and well-nourished.  HENT:  Head: Normocephalic and atraumatic.  Right Ear: Hearing normal.  Left Ear: Hearing normal.  Eyes: Pupils are equal, round, and reactive to light. Conjunctivae and EOM are normal.  Neck: Normal range of motion. Neck supple.  Cardiovascular: Normal rate, regular rhythm, normal heart sounds and intact distal pulses.   Pulmonary/Chest: Effort normal and breath sounds normal.  Abdominal: Soft. There is no tenderness.  Musculoskeletal: Normal range of motion.  Left Bicep with residual PICC line insertion site. No palpable masses or deformities. No erythema. No swelling. Non TTP. ROM intact. NVI.  Distal pulses appreciated.   Neurological: She is alert and oriented to person, place, and time.  Skin: Skin is warm and dry.  Psychiatric: She has a normal mood and affect. Her speech is normal and behavior is normal. Thought content normal.  Nursing note and vitals reviewed.    ED Treatments / Results  Labs (all labs ordered are listed, but only abnormal results are displayed) Labs Reviewed - No data to display  EKG  EKG Interpretation None       Radiology Dg Humerus Left  Result Date: 11/06/2016 CLINICAL DATA:  PICC removal EXAM: LEFT HUMERUS - 2+ VIEW COMPARISON:  10/09/2015, chest radiograph 10/18/2016, 06/13/2014 FINDINGS: Old deformity of the distal left clavicle. Deformity at the left humeral head and neck junction presumably due to old fracture. No radiopaque catheter is seen over the left upper arm. Bony remottling at the elbow 0 re- demonstrated. IMPRESSION: 1. No definite radiopaque catheter material visualized over the upper arm 2. Old posttraumatic deformities of the distal left clavicle and proximal humerus. Electronically Signed   By: Donavan Foil M.D.   On: 11/06/2016 22:18    Procedures Procedures (including critical care time)  Medications Ordered in ED Medications - No data to display   Initial Impression / Assessment and Plan / ED Course  I have reviewed the triage vital signs and the nursing notes.  Pertinent labs & imaging results that were available during my care of the patient were reviewed by me and considered in my medical decision making (see chart for details).  Final Clinical Impressions(s) / ED Diagnoses   {I have reviewed and evaluated the relevant  imaging studies.  {I have reviewed the relevant previous healthcare records.  {I obtained HPI from historian. {Patient discussed with supervising physician.  ED Course:  Assessment: Pt is a 82 y.o. female Left Bicep with residual PICC line insertion site. No palpable masses or deformities. No  erythema. No swelling. Non TTP. ROM intact. NVI. Distal pulses appreciated. On exam, pt in NAD. Nontoxic/nonseptic appearing. VSS. Afebrile. Lungs CTA. Heart RRR. Left Bicep with residual PICC line insertion site. No palpable masses or deformities. No erythema. No swelling. Non TTP. ROM intact. NVI. Distal pulses appreciated. Called nursing facility who still has PICC line. Nursing staff notes not giving evening ABX dosing. Spoke with Radiology (Dr. Ysidro Evert) who recommended plain films for evaluation for PICC line, which was negative. Given 2g Ancef per scheduled dosing in ED. Seen by supervising physician. [place] who will arrange PICC team for tomorrow. Will send home with PIV for Ancef in AM. Plan is to [facility] with follow up to PCP. At time of discharge, Patient is in no acute distress. Vital Signs are stable. Patient is able to ambulate. Patient able to tolerate PO.   Disposition/Plan:  DC Home Additional Verbal discharge instructions given and discussed with patient.  Pt Instructed to f/u with PCP in the next week for evaluation and treatment of symptoms. Return precautions given Pt acknowledges and agrees with plan  Supervising Physician Orlie Dakin, MD  Final diagnoses:  PICC (peripherally inserted central catheter) removal  Displacement of peripherally inserted central venous catheter (PICC) [facility])    New Prescriptions New Prescriptions   No medications on file     Shary Decamp, Hershal Coria 11/06/16 [place], MD 11/06/16 2312

## 2016-11-06 NOTE — ED Notes (Signed)
[name] (husband) call [phone] if they need him.

## 2016-12-05 ENCOUNTER — Encounter: Payer: Self-pay | Admitting: Internal Medicine

## 2016-12-05 ENCOUNTER — Ambulatory Visit (INDEPENDENT_AMBULATORY_CARE_PROVIDER_SITE_OTHER): Payer: Medicare Other | Admitting: Internal Medicine

## 2016-12-05 VITALS — HR 92 | Temp 98.7°F

## 2016-12-05 DIAGNOSIS — T8450XD Infection and inflammatory reaction due to unspecified internal joint prosthesis, subsequent encounter: Secondary | ICD-10-CM

## 2016-12-05 DIAGNOSIS — Z95828 Presence of other vascular implants and grafts: Secondary | ICD-10-CM | POA: Diagnosis not present

## 2016-12-05 DIAGNOSIS — Z5181 Encounter for therapeutic drug level monitoring: Secondary | ICD-10-CM | POA: Diagnosis not present

## 2016-12-05 DIAGNOSIS — R531 Weakness: Secondary | ICD-10-CM | POA: Diagnosis not present

## 2016-12-05 NOTE — Progress Notes (Signed)
   Subjective:    Patient ID: [name], female    DOB: [birth year], 82 y.o.   MRN: [id number]  HPI She is here for follow up of PJI.  Previous history:  She initially underwent left TKA in April 6761 and some complications with cellulitis treated with clindamycin and then developed left knee pain, concerned for infection and underwent I and D with polyexchange in August 2016. She completed 6 weeks of IV therapy with vancomycin and ceftriaxone and continue with doxycycline and cephalexin. She did have some diarrhea but C diff was negative and it resolved once cephalexin was stopped. In December, 2016, aspiration was done and underwent resection arthroplasty with antibiotic spacer placement. At that time, her tibial component could not safely be removed due to cracking so was left in place. I then placed her on vancomycin and ceftriaxone for 28 days and then transitioned to oral therapy. She then presented for reimplantation but she had elevated WBC in her synovial fluid with an elevated CRP and we opted to reinitiate therapy with 6 weeks of IV antibiotics which she completed on June 5th. She then transitioned to oral doxycycline and cefuroxime for 3-6 months and taking that.  I transitioned her to oral cefuroxime for indefinite suppression.    Unfortunately, she came back to Dr. Alvan Dame with drainage of her knee despite suppressive antibiotics and again went for resection arthroplasty on 8/6 and culture grew MSSA.  An antibiotic spacer was placed and she completed 6 weeks of Iv cefazolin.  She has had no issues with the knee though is having visual hallucinations, physical and mental decline.  Otherwise no knee swelling, warmth or pain in the knee.   No associated diarrhea.     Review of Systems  Constitutional: Negative for fatigue and fever.  Skin: Negative for rash.  Psychiatric/Behavioral: Positive for confusion, hallucinations and sleep disturbance.       Objective:   Physical  Exam  Constitutional: She appears well-developed and well-nourished. No distress.  HENT:  Mouth/Throat: No oropharyngeal exudate.  Eyes: No scleral icterus.  Cardiovascular: Normal rate, regular rhythm and normal heart sounds.   No murmur heard. Pulmonary/Chest: Effort normal and breath sounds normal. No respiratory distress.  Lymphadenopathy:    She has no cervical adenopathy.  Skin: No rash noted.   SH: living in a NH, not getting active PT       Assessment & Plan:

## 2016-12-05 NOTE — Assessment & Plan Note (Signed)
I will check a bmp today.

## 2016-12-05 NOTE — Patient Instructions (Signed)
We will pull the picc line today  Please have Palliative Care consult on the patient.

## 2016-12-05 NOTE — Assessment & Plan Note (Signed)
Very difficult case.  She has completed another course of IV therapy and knee looks good now, about 5 days after completing.  I am going to put her back on suppressive antibiotics with cephalexin (previously associated with diarrhea with her but will be twice a day).  If plan it reimplantation, could hold the suppressive antibiotic prior to surgery for a better idea if it is improved or continue if reimplantation not being considered anytime soon. I will check crp and ESR today regardless.

## 2016-12-05 NOTE — Assessment & Plan Note (Signed)
She is mainly bed bound at this time.  I have recommended some attempts at PT.  I also, per the husbands request, have recommended a palliative care consultation.  She has declined considerably during the last year.

## 2016-12-05 NOTE — Progress Notes (Signed)
RN received verbal order from Dr Linus Salmons to draw labs from PICC then discontinue the patient's PICC line.  Patient identified with name and date of birth. PICC dressing removed, site with small amount of dried blood at insertion point, no redness or induration present.  35 cm Single lumen Peripherally Inserted Central Catheter removed from right basilic, tip intact. No sutures present. RN unable to confirm lengthwith records as PICC was placed at outside facility. Sterile petroleum gauze + sterile 4X4 applied to PICC site, pressure applied for 10 minutes and covered with Medipore tape as a pressure dressing. Patient tolerated procedure without complaints.  Patient instructed to limit use of arm for 1 hour. Patient and husband instructed that the pressure dressing should remain in place for 24 hours. Patient's husband verbalized understanding of these instructions. Patient will be returning to [place] [facility]. Landis Gandy, RN

## 2016-12-05 NOTE — Assessment & Plan Note (Signed)
This was removed today.

## 2016-12-06 LAB — C-REACTIVE PROTEIN: CRP: 41 mg/L — ABNORMAL HIGH (ref <8.0)

## 2016-12-06 LAB — BASIC METABOLIC PANEL
BUN: 16 mg/dL (ref 7–25)
CALCIUM: 8.7 mg/dL (ref 8.6–10.4)
CHLORIDE: 103 mmol/L (ref 98–110)
CO2: 27 mmol/L (ref 20–32)
Creat: 0.79 mg/dL (ref 0.60–0.93)
Glucose, Bld: 135 mg/dL — ABNORMAL HIGH (ref 65–99)
POTASSIUM: 3.7 mmol/L (ref 3.5–5.3)
SODIUM: 141 mmol/L (ref 135–146)

## 2016-12-06 LAB — SEDIMENTATION RATE: Sed Rate: 28 mm/h (ref 0–30)

## 2017-02-10 DEATH — deceased

## 2018-02-28 IMAGING — CT CT HEAD W/O CM
4 of 8 series · 13 of 47 positions shown, 14 images · non-contrast
Comparison: Cervical spine CT myelogram June 30, 2013; head CT
October 09, 2015

CLINICAL DATA: Pain following fall

EXAM:
CT HEAD WITHOUT CONTRAST
CT CERVICAL SPINE WITHOUT CONTRAST
TECHNIQUE: Multidetector CT imaging of the head and cervical spine was
performed following the standard protocol without intravenous
contrast. Multiplanar CT image reconstructions of the cervical spine
were also generated.

[Series 3: head without · axial · non-contrast · 0.45mm/px · z∈[-75,-25]mm · 2 of 31 slices shown, 3 images]
[im 11/31  brain]
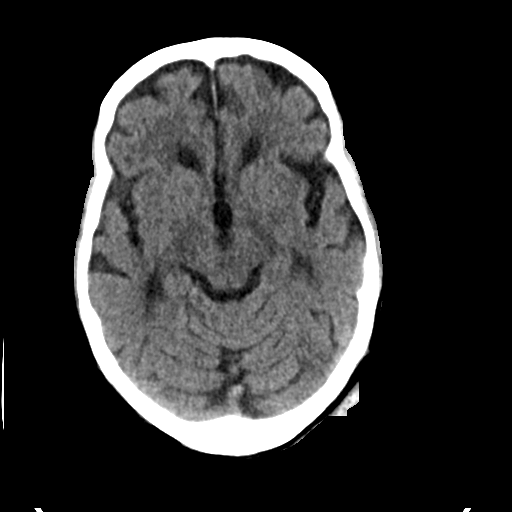
[im 11/31  bone]
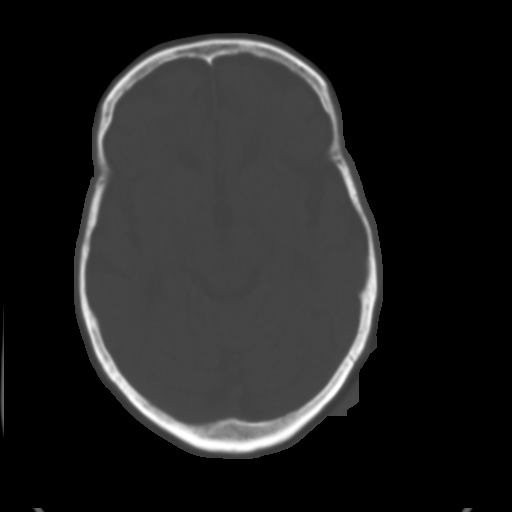
[im 21/31  brain]
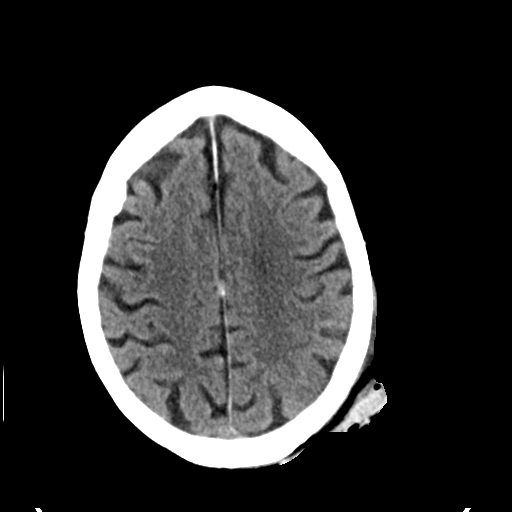

[Series 4: head bone · axial · 0.45mm/px · z∈[-103,+7]mm · 6 of 78 slices shown]
[im 12/78  bone]
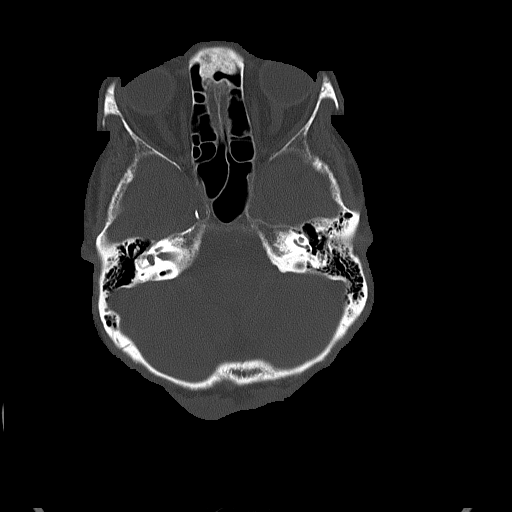
[im 23/78  bone]
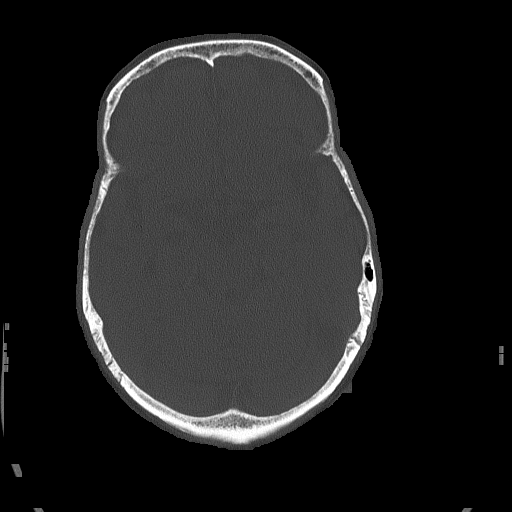
[im 34/78  bone]
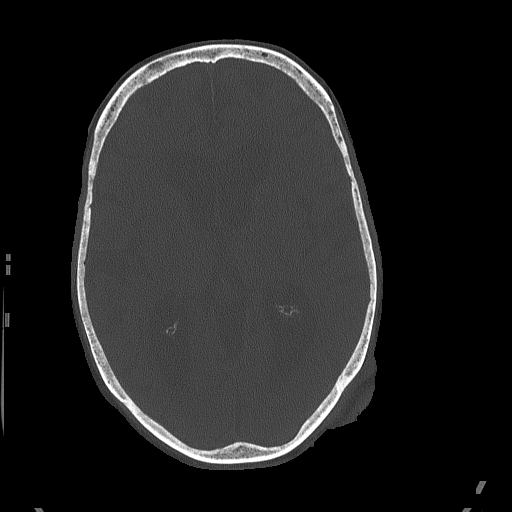
[im 45/78  bone]
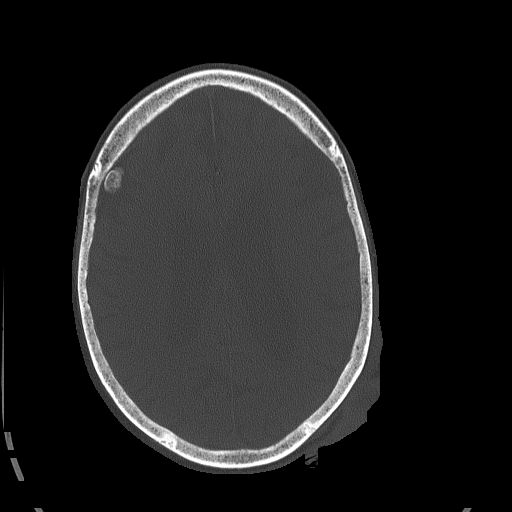
[im 56/78  bone]
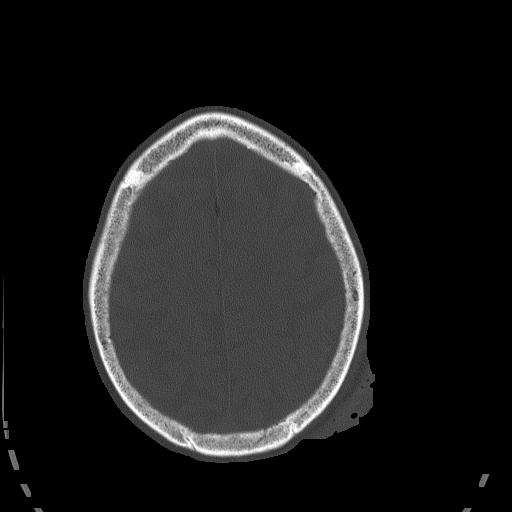
[im 67/78  bone]
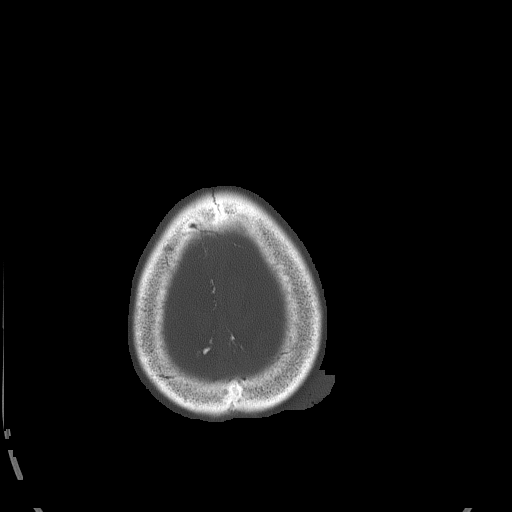

[Series 5: head without cor · coronal · non-contrast · 0.30mm/px · 3 of 67 slices shown]
[im 17/67  brain]
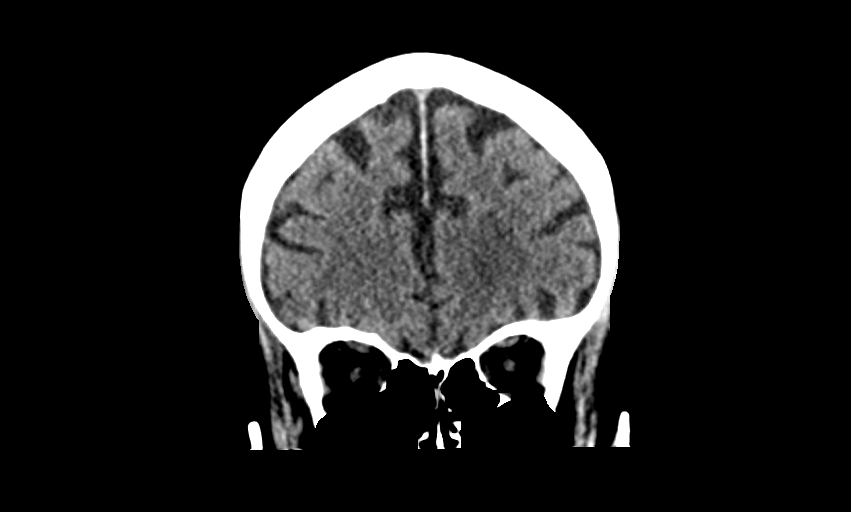
[im 34/67  brain]
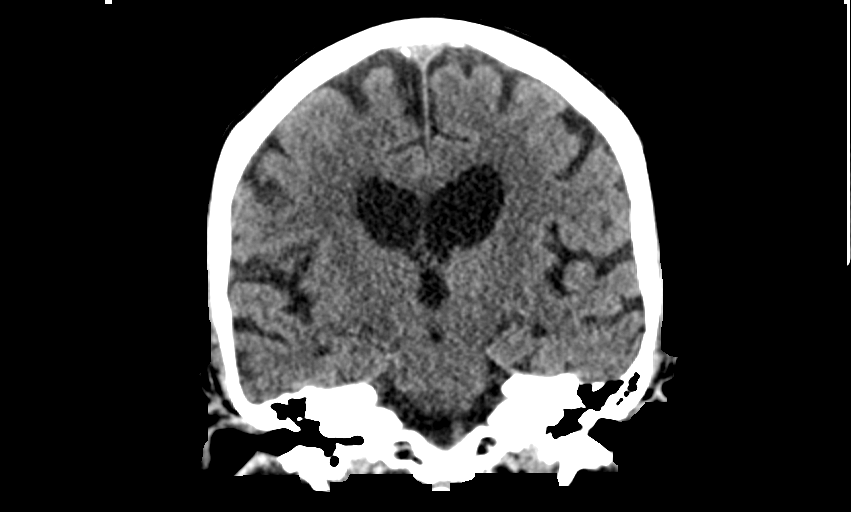
[im 50/67  brain]
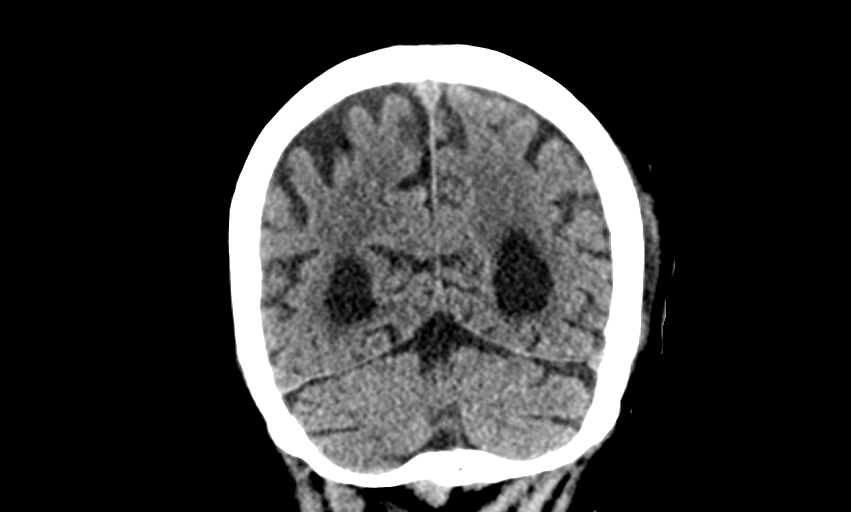

[Series 6: head without sag · sagittal · non-contrast · 0.30mm/px · 2 of 63 slices shown]
[im 21/63  brain]
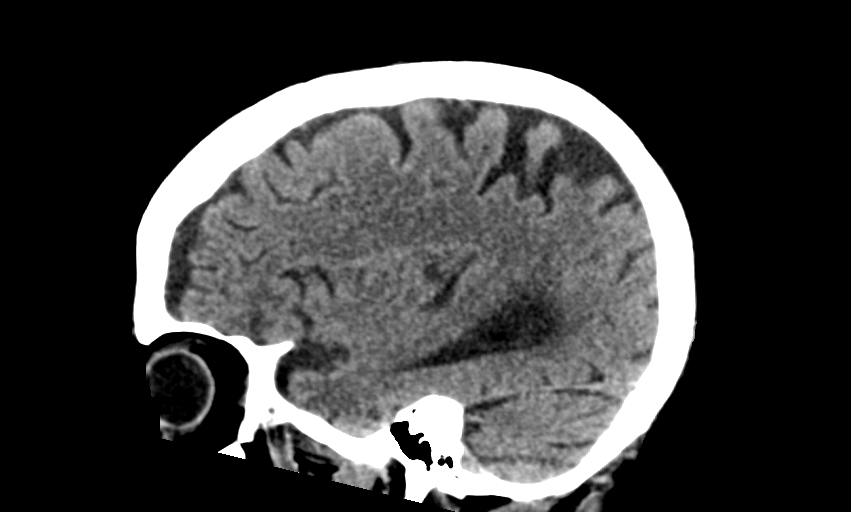
[im 42/63  brain]
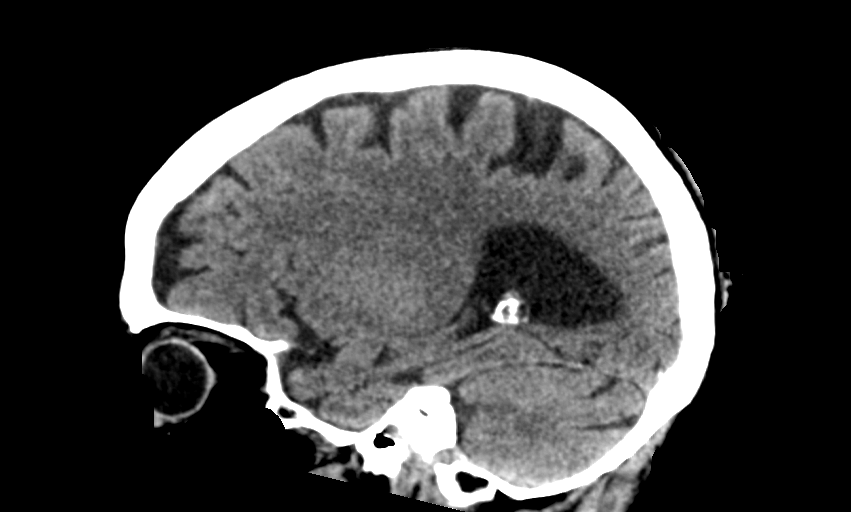

[13 of 47 positions shown; findings below may reference images not displayed]

FINDINGS: CT HEAD FINDINGS

Brain: Moderate diffuse atrophy is stable. There is a stable
calcified meningioma along the dura of the right frontal region
measuring 2.0 x 0.7 cm. There is no surrounding edema. There is a 6
mm right parasagittal calcified meningioma without surrounding edema
as well, stable. There is no new mass. There is no hemorrhage,
extra-axial fluid collection, or midline shift. There is patchy
small vessel disease in the centra semiovale bilaterally. Elsewhere
gray-white compartments appear unremarkable. No evident acute
infarct.

Vascular: There is no hyperdense vessel. There is extensive
calcification in both carotid siphon regions.

Skull: There is a sizable left parietal scalp hematoma. No fracture
evident. The bony calvarium appears intact.

Sinuses/Orbits: There is mucosal thickening in several ethmoid air
cells bilaterally, more on the right than on the left. Paranasal
sinuses which are visualized elsewhere appear clear. Orbits appear
symmetric bilaterally.

Other: Mastoid air cells are clear.

CT CERVICAL SPINE FINDINGS

Alignment: There is cervical dextroscoliosis. There is 4 mm of
anterolisthesis of C4 on C5. There is 3 mm of anterolisthesis of C7
on T1. No other spondylolisthesis evident.

Skull base and vertebrae: The skull base and craniocervical junction
regions appear normal. The patient is status post anterior screw and
plate fixation at C4 and C5 with disc spacer at C4-5. Support
hardware intact. There is no demonstrable acute fracture. There are
no blastic or lytic bone lesions.

Soft tissues and spinal canal: Prevertebral soft tissues and
predental space regions are normal. There is no cord or canal
hematoma seen. There is no high-grade spinal stenosis.

Disc levels: There is marked disc space narrowing at C5-6, C6-7, and
C7-T1. There is moderate disc space narrowing at C2-3. Disc spacer
noted at C4-5. There is multilevel osteoarthritic change with exit
foraminal narrowing due to bony hypertrophy at multiple levels. Exit
foraminal narrowing is most severe on the left at C5-6 where there
is impression on the exiting nerve root. There is no frank disc
extrusion evident.

Upper chest: Visualized lung apices are clear. There is aortic
atherosclerosis.

Other: There is calcification in the right subclavian artery. There
are foci of coronary artery calcification bilaterally. There are
nodular lesions in the thyroid consistent with multinodular goiter.
IMPRESSION: CT head: Sizable left parietal scalp hematoma. No fracture. Stable
atrophy with periventricular small vessel disease. Stable calcified
meningiomas in the right frontal region without surrounding edema.
No intracranial mass, hemorrhage, or extra-axial fluid collection.
No acute appearing infarct. There are foci of arterial vascular
calcification. There is ethmoid sinus disease, more notable on the
right than on the left.

CT cervical spine: No acute fracture. Spondylolisthesis at C4-5 and
C7-T1, likely due to underlying spondylolisthesis. Note that there
is postoperative fixation anteriorly at C4 and C5. There is
multilevel arthropathy. Note that there is marked exit foraminal
narrowing on the left at C5-6 with impression on the exit foramen at
this level.

Aortic atherosclerosis as well as calcification in several arterial
vessels including both carotid arteries. Multinodular goiter.

## 2018-02-28 IMAGING — DX DG CHEST 2V
2 series · 2 of 2 positions shown · non-contrast
Comparison: 02/23/2015

CLINICAL DATA: Fever.

EXAM:
CHEST  2 VIEW

[x chest ap]
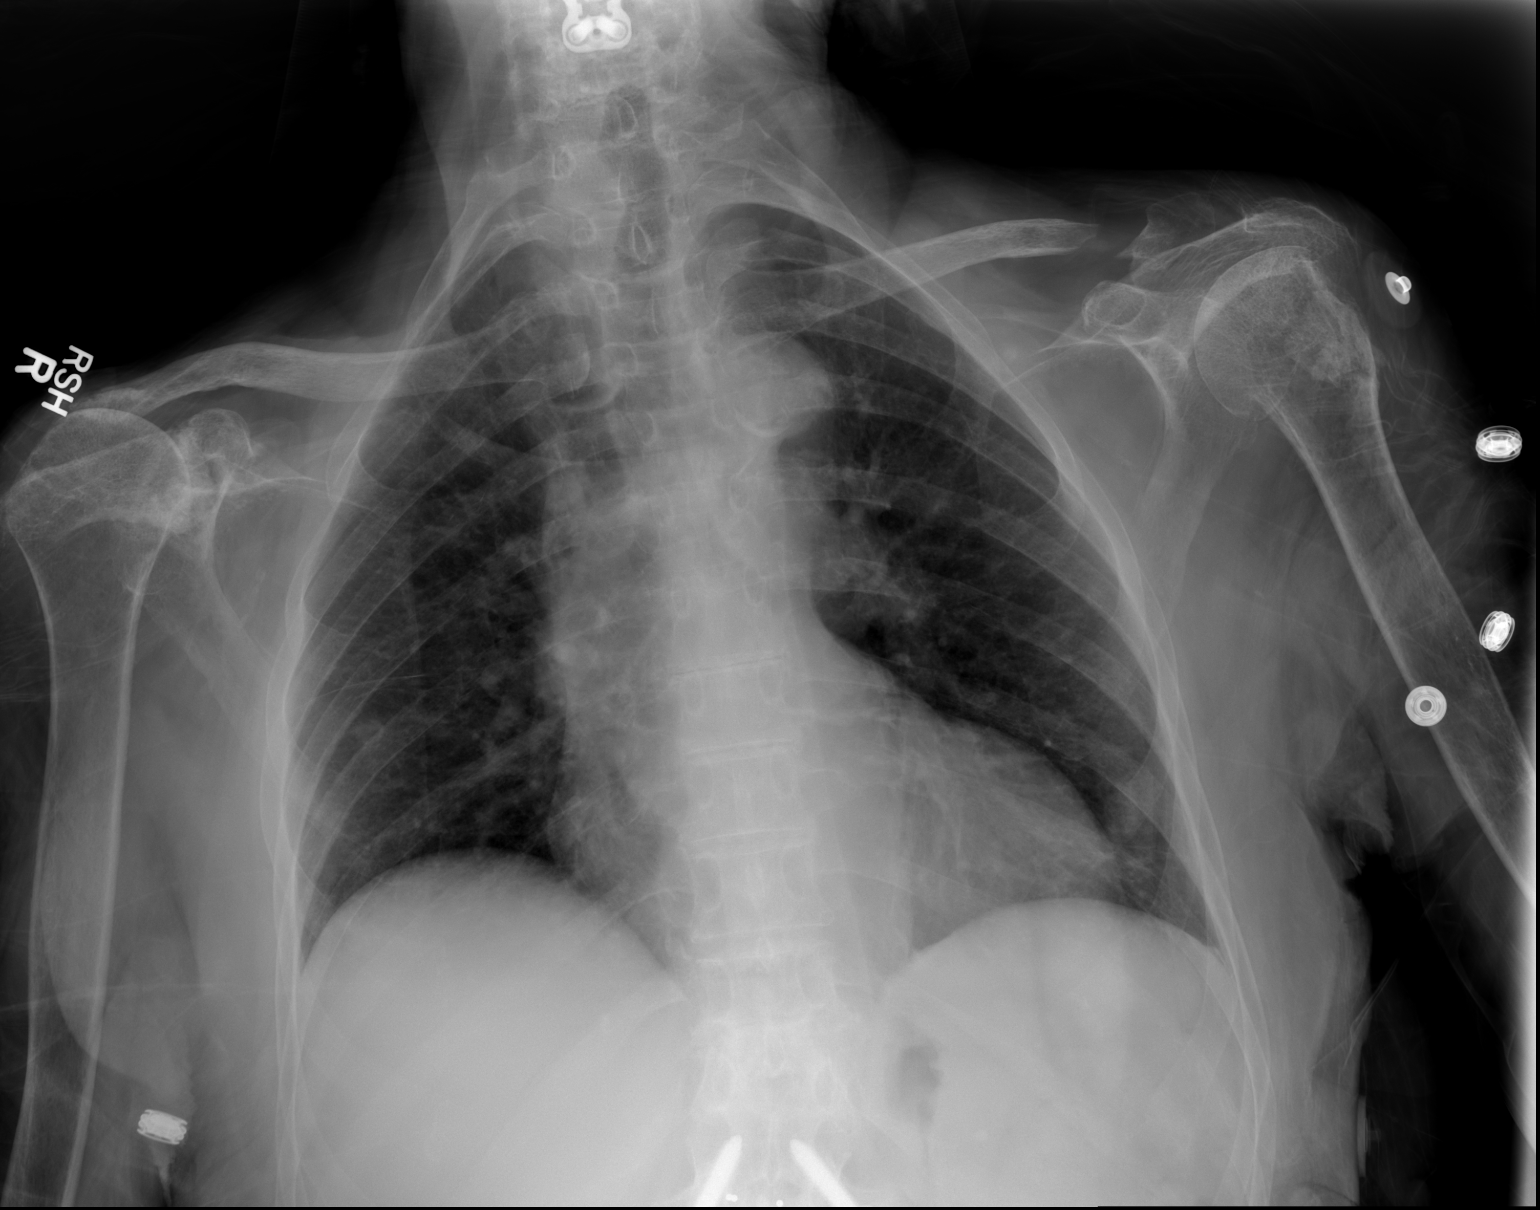

[w chest lat]
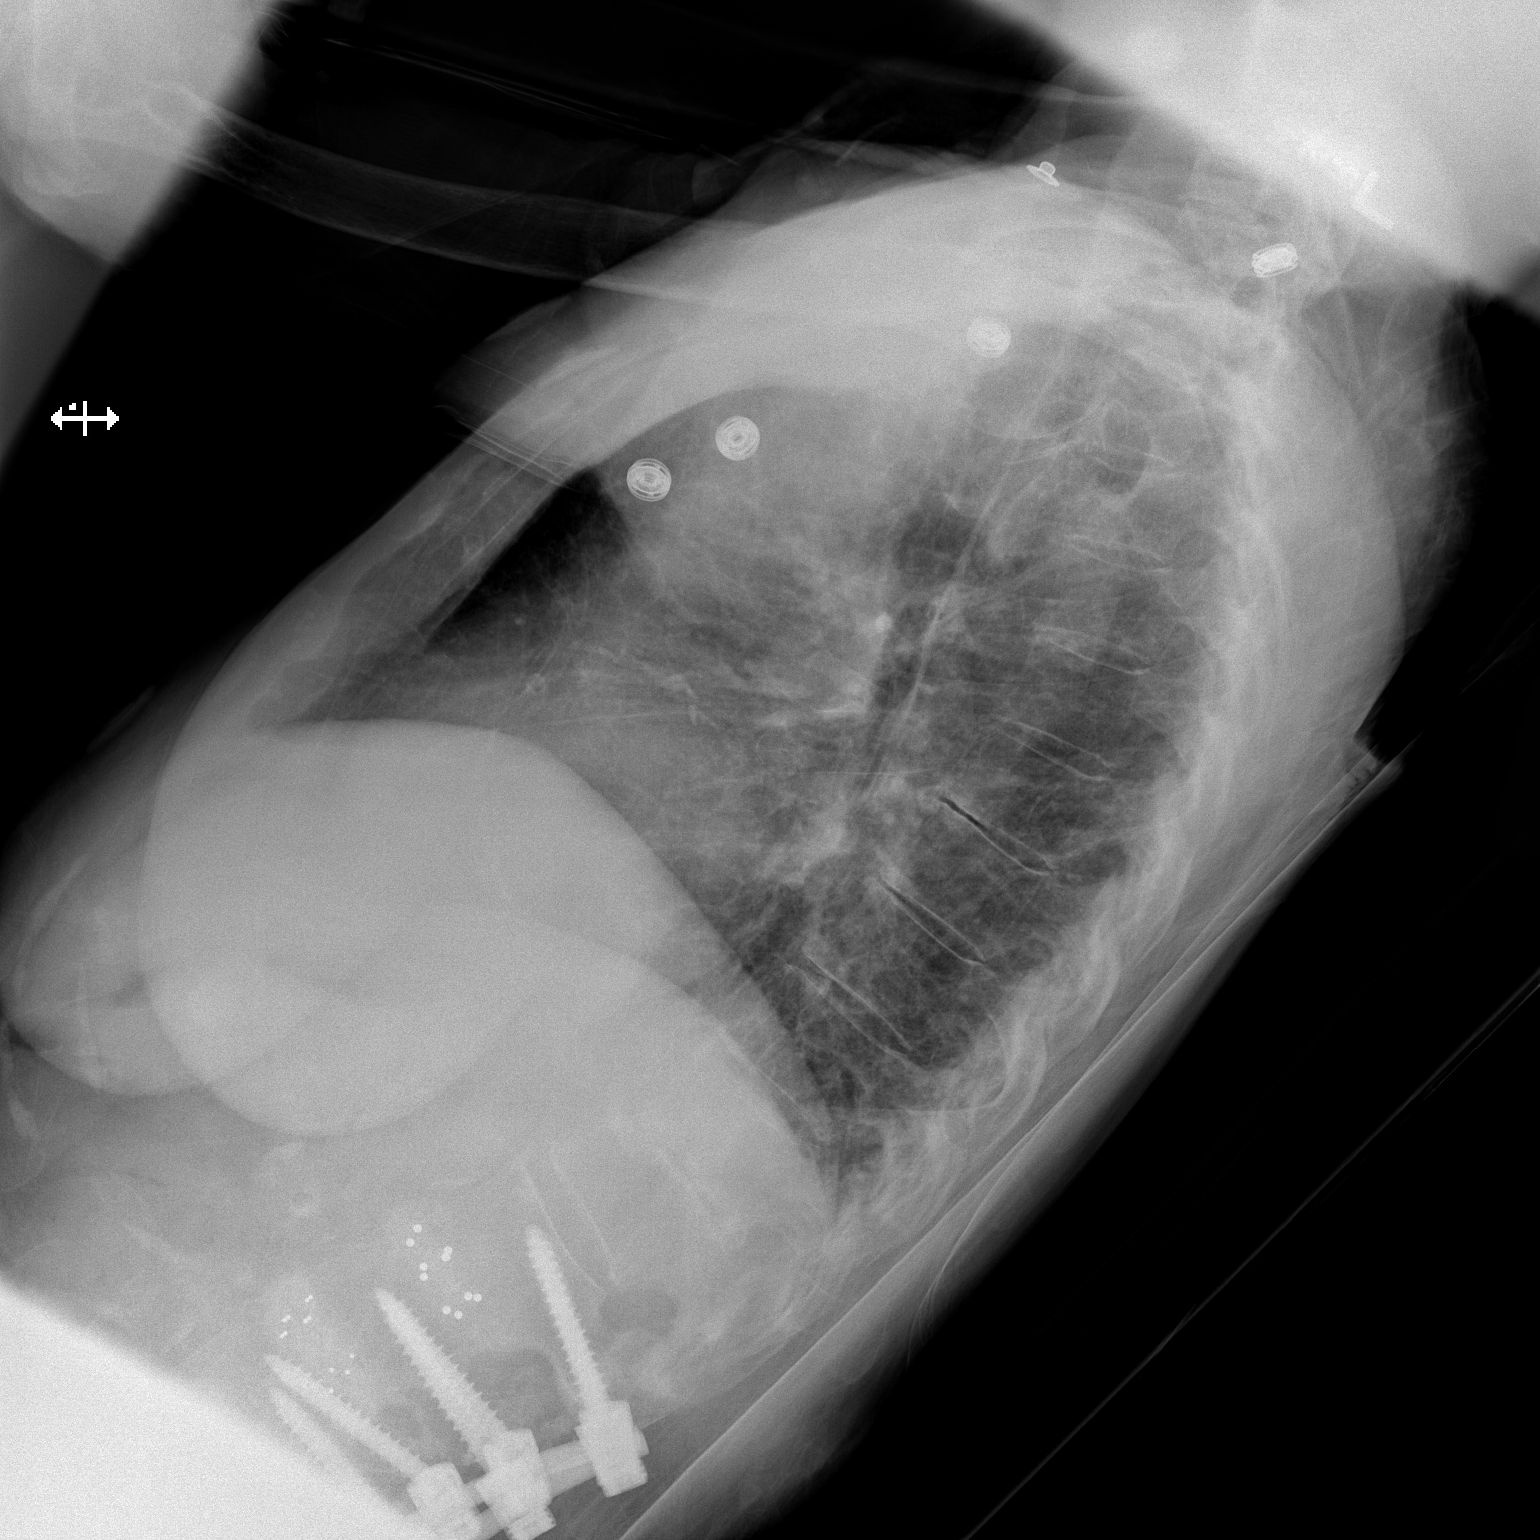

[2 of 2 positions shown; findings below may reference images not displayed]

FINDINGS: Borderline cardiomegaly. Stable mediastinal contours. Aortic
atherosclerosis.

Calcified granuloma over the right chest, stable over multiple
exams. There is no edema, consolidation, effusion, or pneumothorax.

Chronic bilateral rotator cuff tears. Question chronic Hill-Sachs
deformity on the left. Chronic fragmentation of the lateral left
clavicle.
IMPRESSION: Negative for pneumonia or other acute finding.
